# Patient Record
Sex: Female | Born: 1960 | Race: Black or African American | Hispanic: No | Smoking: Never smoker
Health system: Southern US, Community
[De-identification: ages and names within clinical notes are randomized; demographics above are authoritative.]

## PROBLEM LIST (undated history)

## (undated) DIAGNOSIS — G931 Anoxic brain damage, not elsewhere classified: Secondary | ICD-10-CM

## (undated) DIAGNOSIS — J8410 Pulmonary fibrosis, unspecified: Secondary | ICD-10-CM

## (undated) DIAGNOSIS — K5792 Diverticulitis of intestine, part unspecified, without perforation or abscess without bleeding: Secondary | ICD-10-CM

## (undated) DIAGNOSIS — I4891 Unspecified atrial fibrillation: Secondary | ICD-10-CM

## (undated) DIAGNOSIS — D473 Essential (hemorrhagic) thrombocythemia: Secondary | ICD-10-CM

## (undated) DIAGNOSIS — I2781 Cor pulmonale (chronic): Secondary | ICD-10-CM

## (undated) DIAGNOSIS — J9621 Acute and chronic respiratory failure with hypoxia: Secondary | ICD-10-CM

## (undated) DIAGNOSIS — M329 Systemic lupus erythematosus, unspecified: Secondary | ICD-10-CM

## (undated) DIAGNOSIS — Z7901 Long term (current) use of anticoagulants: Secondary | ICD-10-CM

## (undated) DIAGNOSIS — I272 Pulmonary hypertension, unspecified: Secondary | ICD-10-CM

## (undated) DIAGNOSIS — I10 Essential (primary) hypertension: Secondary | ICD-10-CM

## (undated) DIAGNOSIS — D609 Acquired pure red cell aplasia, unspecified: Secondary | ICD-10-CM

## (undated) HISTORY — DX: Systemic lupus erythematosus, unspecified: M32.9

## (undated) HISTORY — PX: CARDIAC CATHETERIZATION: SHX172

## (undated) HISTORY — DX: Acquired pure red cell aplasia, unspecified: D60.9

## (undated) HISTORY — DX: Long term (current) use of anticoagulants: Z79.01

## (undated) HISTORY — DX: Essential (hemorrhagic) thrombocythemia: D47.3

---

## 2002-06-15 ENCOUNTER — Encounter: Payer: Self-pay | Admitting: Internal Medicine

## 2002-06-15 ENCOUNTER — Encounter: Admission: RE | Admit: 2002-06-15 | Discharge: 2002-06-15 | Payer: Self-pay | Admitting: Internal Medicine

## 2002-12-10 ENCOUNTER — Encounter: Admission: RE | Admit: 2002-12-10 | Discharge: 2002-12-10 | Payer: Self-pay | Admitting: Rheumatology

## 2002-12-10 ENCOUNTER — Encounter: Payer: Self-pay | Admitting: Rheumatology

## 2002-12-17 ENCOUNTER — Encounter: Admission: RE | Admit: 2002-12-17 | Discharge: 2002-12-17 | Payer: Self-pay | Admitting: Rheumatology

## 2002-12-17 ENCOUNTER — Encounter: Payer: Self-pay | Admitting: Rheumatology

## 2006-09-01 ENCOUNTER — Emergency Department (HOSPITAL_COMMUNITY): Admission: EM | Admit: 2006-09-01 | Discharge: 2006-09-01 | Payer: Self-pay | Admitting: Emergency Medicine

## 2006-12-05 ENCOUNTER — Encounter: Admission: RE | Admit: 2006-12-05 | Discharge: 2006-12-05 | Payer: Self-pay | Admitting: Internal Medicine

## 2007-07-17 ENCOUNTER — Observation Stay (HOSPITAL_COMMUNITY): Admission: EM | Admit: 2007-07-17 | Discharge: 2007-07-18 | Payer: Self-pay | Admitting: Emergency Medicine

## 2007-07-17 ENCOUNTER — Encounter (INDEPENDENT_AMBULATORY_CARE_PROVIDER_SITE_OTHER): Payer: Self-pay | Admitting: Internal Medicine

## 2007-07-17 ENCOUNTER — Ambulatory Visit: Payer: Self-pay | Admitting: Vascular Surgery

## 2007-08-07 ENCOUNTER — Ambulatory Visit (HOSPITAL_COMMUNITY): Admission: RE | Admit: 2007-08-07 | Discharge: 2007-08-07 | Payer: Self-pay | Admitting: Neurology

## 2007-08-08 ENCOUNTER — Encounter: Admission: RE | Admit: 2007-08-08 | Discharge: 2007-08-08 | Payer: Self-pay | Admitting: Neurology

## 2007-09-02 ENCOUNTER — Ambulatory Visit (HOSPITAL_COMMUNITY): Admission: RE | Admit: 2007-09-02 | Discharge: 2007-09-02 | Payer: Self-pay | Admitting: Neurology

## 2007-09-30 ENCOUNTER — Encounter: Admission: RE | Admit: 2007-09-30 | Discharge: 2007-12-29 | Payer: Self-pay | Admitting: Otolaryngology

## 2008-10-08 HISTORY — PX: BONE MARROW BIOPSY: SHX199

## 2009-07-27 ENCOUNTER — Observation Stay (HOSPITAL_COMMUNITY): Admission: EM | Admit: 2009-07-27 | Discharge: 2009-07-29 | Payer: Self-pay | Admitting: Emergency Medicine

## 2009-09-02 ENCOUNTER — Inpatient Hospital Stay (HOSPITAL_COMMUNITY): Admission: EM | Admit: 2009-09-02 | Discharge: 2009-09-07 | Payer: Self-pay | Admitting: Emergency Medicine

## 2009-09-03 ENCOUNTER — Ambulatory Visit: Payer: Self-pay | Admitting: Hematology & Oncology

## 2009-09-05 ENCOUNTER — Encounter (INDEPENDENT_AMBULATORY_CARE_PROVIDER_SITE_OTHER): Payer: Self-pay | Admitting: Internal Medicine

## 2009-09-07 ENCOUNTER — Ambulatory Visit: Payer: Self-pay | Admitting: Hematology & Oncology

## 2009-09-13 LAB — MORPHOLOGY - CHCC SATELLITE: PLT EST ~~LOC~~: INCREASED

## 2009-09-13 LAB — CBC WITH DIFFERENTIAL (CANCER CENTER ONLY)
BASO#: 0.2 10*3/uL (ref 0.0–0.2)
BASO%: 1 % (ref 0.0–2.0)
EOS%: 1.1 % (ref 0.0–7.0)
Eosinophils Absolute: 0.2 10*3/uL (ref 0.0–0.5)
HCT: 28.3 % — ABNORMAL LOW (ref 34.8–46.6)
HGB: 10.1 g/dL — ABNORMAL LOW (ref 11.6–15.9)
LYMPH#: 4.4 10*3/uL — ABNORMAL HIGH (ref 0.9–3.3)
LYMPH%: 22 % (ref 14.0–48.0)
MCH: 30.3 pg (ref 26.0–34.0)
MCHC: 35.8 g/dL (ref 32.0–36.0)
MCV: 85 fL (ref 81–101)
MONO#: 1.4 10*3/uL — ABNORMAL HIGH (ref 0.1–0.9)
MONO%: 7.2 % (ref 0.0–13.0)
NEUT#: 13.7 10*3/uL — ABNORMAL HIGH (ref 1.5–6.5)
NEUT%: 68.7 % (ref 39.6–80.0)
Platelets: 987 10*3/uL — ABNORMAL HIGH (ref 145–400)
RBC: 3.35 10*6/uL — ABNORMAL LOW (ref 3.70–5.32)
RDW: 12.2 % (ref 10.5–14.6)
WBC: 20 10*3/uL — ABNORMAL HIGH (ref 3.9–10.0)

## 2009-09-13 LAB — LACTATE DEHYDROGENASE: LDH: 111 U/L (ref 94–250)

## 2009-09-13 LAB — RETICULOCYTES (CHCC)
ABS Retic: 6.4 10*3/uL — ABNORMAL LOW (ref 19.0–186.0)
RBC.: 3.22 MIL/uL — ABNORMAL LOW (ref 3.87–5.11)
Retic Ct Pct: 0.2 % — ABNORMAL LOW (ref 0.4–3.1)

## 2009-09-27 LAB — CBC WITH DIFFERENTIAL (CANCER CENTER ONLY)
BASO#: 0.1 10*3/uL (ref 0.0–0.2)
BASO%: 0.8 % (ref 0.0–2.0)
EOS%: 1.2 % (ref 0.0–7.0)
Eosinophils Absolute: 0.2 10*3/uL (ref 0.0–0.5)
HCT: 30.8 % — ABNORMAL LOW (ref 34.8–46.6)
HGB: 10.8 g/dL — ABNORMAL LOW (ref 11.6–15.9)
LYMPH#: 4.3 10*3/uL — ABNORMAL HIGH (ref 0.9–3.3)
LYMPH%: 24.4 % (ref 14.0–48.0)
MCH: 31.6 pg (ref 26.0–34.0)
MCHC: 35.1 g/dL (ref 32.0–36.0)
MCV: 90 fL (ref 81–101)
MONO#: 1 10*3/uL — ABNORMAL HIGH (ref 0.1–0.9)
MONO%: 5.5 % (ref 0.0–13.0)
NEUT#: 11.9 10*3/uL — ABNORMAL HIGH (ref 1.5–6.5)
NEUT%: 68.1 % (ref 39.6–80.0)
Platelets: 519 10*3/uL — ABNORMAL HIGH (ref 145–400)
RBC: 3.43 10*6/uL — ABNORMAL LOW (ref 3.70–5.32)
RDW: 13 % (ref 10.5–14.6)
WBC: 17.5 10*3/uL — ABNORMAL HIGH (ref 3.9–10.0)

## 2009-09-27 LAB — CHCC SATELLITE - SMEAR

## 2009-09-30 LAB — RETICULOCYTES (CHCC)
ABS Retic: 372.9 10*3/uL — ABNORMAL HIGH (ref 19.0–186.0)
RBC.: 3.39 MIL/uL — ABNORMAL LOW (ref 3.87–5.11)
Retic Ct Pct: 11 % — ABNORMAL HIGH (ref 0.4–3.1)

## 2009-09-30 LAB — JAK2 GENOTYPR: JAK2 GenotypR: NOT DETECTED

## 2009-10-10 ENCOUNTER — Ambulatory Visit: Payer: Self-pay | Admitting: Hematology & Oncology

## 2009-10-11 LAB — CBC WITH DIFFERENTIAL (CANCER CENTER ONLY)
BASO#: 0.2 10*3/uL (ref 0.0–0.2)
BASO%: 1 % (ref 0.0–2.0)
EOS%: 1.8 % (ref 0.0–7.0)
Eosinophils Absolute: 0.3 10*3/uL (ref 0.0–0.5)
HCT: 41.1 % (ref 34.8–46.6)
HGB: 13.5 g/dL (ref 11.6–15.9)
LYMPH#: 3.8 10*3/uL — ABNORMAL HIGH (ref 0.9–3.3)
LYMPH%: 21.9 % (ref 14.0–48.0)
MCH: 33 pg (ref 26.0–34.0)
MCHC: 32.9 g/dL (ref 32.0–36.0)
MCV: 100 fL (ref 81–101)
MONO#: 0.9 10*3/uL (ref 0.1–0.9)
MONO%: 5.1 % (ref 0.0–13.0)
NEUT#: 12.1 10*3/uL — ABNORMAL HIGH (ref 1.5–6.5)
NEUT%: 70.2 % (ref 39.6–80.0)
Platelets: 349 10*3/uL (ref 145–400)
RBC: 4.1 10*6/uL (ref 3.70–5.32)
RDW: 18.5 % — ABNORMAL HIGH (ref 10.5–14.6)
WBC: 17.3 10*3/uL — ABNORMAL HIGH (ref 3.9–10.0)

## 2009-10-11 LAB — RETICULOCYTES (CHCC)
ABS Retic: 96.7 10*3/uL (ref 19.0–186.0)
RBC.: 4.03 MIL/uL (ref 3.87–5.11)
Retic Ct Pct: 2.4 % (ref 0.4–3.1)

## 2009-10-11 LAB — CHCC SATELLITE - SMEAR

## 2009-10-11 LAB — TECHNOLOGIST REVIEW CHCC SATELLITE

## 2009-11-01 LAB — CBC WITH DIFFERENTIAL (CANCER CENTER ONLY)
BASO#: 0.2 10*3/uL (ref 0.0–0.2)
BASO%: 1.2 % (ref 0.0–2.0)
EOS%: 1.9 % (ref 0.0–7.0)
Eosinophils Absolute: 0.4 10*3/uL (ref 0.0–0.5)
HCT: 40.4 % (ref 34.8–46.6)
HGB: 13.5 g/dL (ref 11.6–15.9)
LYMPH#: 3.6 10*3/uL — ABNORMAL HIGH (ref 0.9–3.3)
LYMPH%: 18.6 % (ref 14.0–48.0)
MCH: 33.4 pg (ref 26.0–34.0)
MCHC: 33.5 g/dL (ref 32.0–36.0)
MCV: 100 fL (ref 81–101)
MONO#: 1.2 10*3/uL — ABNORMAL HIGH (ref 0.1–0.9)
MONO%: 6.4 % (ref 0.0–13.0)
NEUT#: 13.9 10*3/uL — ABNORMAL HIGH (ref 1.5–6.5)
NEUT%: 71.9 % (ref 39.6–80.0)
Platelets: 417 10*3/uL — ABNORMAL HIGH (ref 145–400)
RBC: 4.04 10*6/uL (ref 3.70–5.32)
RDW: 13.5 % (ref 10.5–14.6)
WBC: 19.3 10*3/uL — ABNORMAL HIGH (ref 3.9–10.0)

## 2009-11-01 LAB — RETICULOCYTES (CHCC)
ABS Retic: 77.1 10*3/uL (ref 19.0–186.0)
RBC.: 4.06 MIL/uL (ref 3.87–5.11)
Retic Ct Pct: 1.9 % (ref 0.4–3.1)

## 2009-11-01 LAB — CHCC SATELLITE - SMEAR

## 2009-11-10 ENCOUNTER — Encounter: Admission: RE | Admit: 2009-11-10 | Discharge: 2009-11-10 | Payer: Self-pay | Admitting: Internal Medicine

## 2009-11-21 ENCOUNTER — Ambulatory Visit: Payer: Self-pay | Admitting: Hematology & Oncology

## 2009-11-22 LAB — RETICULOCYTES (CHCC)
ABS Retic: 63.2 10*3/uL (ref 19.0–186.0)
RBC.: 4.21 MIL/uL (ref 3.87–5.11)
Retic Ct Pct: 1.5 % (ref 0.4–3.1)

## 2009-11-22 LAB — CBC WITH DIFFERENTIAL (CANCER CENTER ONLY)
BASO#: 0.1 10*3/uL (ref 0.0–0.2)
BASO%: 0.7 % (ref 0.0–2.0)
EOS%: 1.6 % (ref 0.0–7.0)
Eosinophils Absolute: 0.2 10*3/uL (ref 0.0–0.5)
HCT: 43.1 % (ref 34.8–46.6)
HGB: 14.4 g/dL (ref 11.6–15.9)
LYMPH#: 1.9 10*3/uL (ref 0.9–3.3)
LYMPH%: 15 % (ref 14.0–48.0)
MCH: 33.1 pg (ref 26.0–34.0)
MCHC: 33.4 g/dL (ref 32.0–36.0)
MCV: 99 fL (ref 81–101)
MONO#: 0.7 10*3/uL (ref 0.1–0.9)
MONO%: 5.7 % (ref 0.0–13.0)
NEUT#: 9.8 10*3/uL — ABNORMAL HIGH (ref 1.5–6.5)
NEUT%: 77 % (ref 39.6–80.0)
Platelets: 439 10*3/uL — ABNORMAL HIGH (ref 145–400)
RBC: 4.36 10*6/uL (ref 3.70–5.32)
RDW: 11.5 % (ref 10.5–14.6)
WBC: 12.7 10*3/uL — ABNORMAL HIGH (ref 3.9–10.0)

## 2009-11-22 LAB — CHCC SATELLITE - SMEAR

## 2010-01-02 ENCOUNTER — Ambulatory Visit: Payer: Self-pay | Admitting: Hematology & Oncology

## 2010-01-03 LAB — CBC WITH DIFFERENTIAL (CANCER CENTER ONLY)
BASO#: 0.1 10*3/uL (ref 0.0–0.2)
BASO%: 1.2 % (ref 0.0–2.0)
EOS%: 7.3 % — ABNORMAL HIGH (ref 0.0–7.0)
Eosinophils Absolute: 0.7 10*3/uL — ABNORMAL HIGH (ref 0.0–0.5)
HCT: 43 % (ref 34.8–46.6)
HGB: 14.1 g/dL (ref 11.6–15.9)
LYMPH#: 2.4 10*3/uL (ref 0.9–3.3)
LYMPH%: 24.2 % (ref 14.0–48.0)
MCH: 31.4 pg (ref 26.0–34.0)
MCHC: 32.9 g/dL (ref 32.0–36.0)
MCV: 95 fL (ref 81–101)
MONO#: 0.8 10*3/uL (ref 0.1–0.9)
MONO%: 7.9 % (ref 0.0–13.0)
NEUT#: 5.8 10*3/uL (ref 1.5–6.5)
NEUT%: 59.4 % (ref 39.6–80.0)
Platelets: 484 10*3/uL — ABNORMAL HIGH (ref 145–400)
RBC: 4.51 10*6/uL (ref 3.70–5.32)
RDW: 10.6 % (ref 10.5–14.6)
WBC: 9.7 10*3/uL (ref 3.9–10.0)

## 2010-01-03 LAB — RETICULOCYTES (CHCC)
ABS Retic: 40.2 10*3/uL (ref 19.0–186.0)
RBC.: 4.47 MIL/uL (ref 3.87–5.11)
Retic Ct Pct: 0.9 % (ref 0.4–3.1)

## 2010-01-03 LAB — CHCC SATELLITE - SMEAR

## 2010-02-20 ENCOUNTER — Ambulatory Visit: Payer: Self-pay | Admitting: Hematology & Oncology

## 2010-02-21 LAB — CBC WITH DIFFERENTIAL (CANCER CENTER ONLY)
BASO#: 0.1 10*3/uL (ref 0.0–0.2)
BASO%: 0.8 % (ref 0.0–2.0)
EOS%: 2.2 % (ref 0.0–7.0)
Eosinophils Absolute: 0.2 10*3/uL (ref 0.0–0.5)
HCT: 39.5 % (ref 34.8–46.6)
HGB: 13.5 g/dL (ref 11.6–15.9)
LYMPH#: 1.8 10*3/uL (ref 0.9–3.3)
LYMPH%: 18.3 % (ref 14.0–48.0)
MCH: 32.4 pg (ref 26.0–34.0)
MCHC: 34.3 g/dL (ref 32.0–36.0)
MCV: 95 fL (ref 81–101)
MONO#: 0.7 10*3/uL (ref 0.1–0.9)
MONO%: 6.8 % (ref 0.0–13.0)
NEUT#: 7 10*3/uL — ABNORMAL HIGH (ref 1.5–6.5)
NEUT%: 71.9 % (ref 39.6–80.0)
Platelets: 433 10*3/uL — ABNORMAL HIGH (ref 145–400)
RBC: 4.18 10*6/uL (ref 3.70–5.32)
RDW: 11.9 % (ref 10.5–14.6)
WBC: 9.7 10*3/uL (ref 3.9–10.0)

## 2010-02-21 LAB — RETICULOCYTES (CHCC)
ABS Retic: 48.6 10*3/uL (ref 19.0–186.0)
RBC.: 4.05 MIL/uL (ref 3.87–5.11)
Retic Ct Pct: 1.2 % (ref 0.4–3.1)

## 2010-02-21 LAB — FERRITIN: Ferritin: 136 ng/mL (ref 10–291)

## 2010-02-21 LAB — CHCC SATELLITE - SMEAR

## 2010-06-09 ENCOUNTER — Ambulatory Visit: Payer: Self-pay | Admitting: Hematology & Oncology

## 2010-06-13 LAB — CBC WITH DIFFERENTIAL (CANCER CENTER ONLY)
BASO#: 0.1 10*3/uL (ref 0.0–0.2)
BASO%: 0.7 % (ref 0.0–2.0)
EOS%: 1.5 % (ref 0.0–7.0)
Eosinophils Absolute: 0.2 10*3/uL (ref 0.0–0.5)
HCT: 39.6 % (ref 34.8–46.6)
HGB: 13.2 g/dL (ref 11.6–15.9)
LYMPH#: 2 10*3/uL (ref 0.9–3.3)
LYMPH%: 16.6 % (ref 14.0–48.0)
MCH: 30.4 pg (ref 26.0–34.0)
MCHC: 33.3 g/dL (ref 32.0–36.0)
MCV: 91 fL (ref 81–101)
MONO#: 0.7 10*3/uL (ref 0.1–0.9)
MONO%: 5.8 % (ref 0.0–13.0)
NEUT#: 8.9 10*3/uL — ABNORMAL HIGH (ref 1.5–6.5)
NEUT%: 75.4 % (ref 39.6–80.0)
Platelets: 468 10*3/uL — ABNORMAL HIGH (ref 145–400)
RBC: 4.35 10*6/uL (ref 3.70–5.32)
RDW: 11.4 % (ref 10.5–14.6)
WBC: 11.8 10*3/uL — ABNORMAL HIGH (ref 3.9–10.0)

## 2010-06-13 LAB — RETICULOCYTES (CHCC)
ABS Retic: 56.9 10*3/uL (ref 19.0–186.0)
RBC.: 4.38 MIL/uL (ref 3.87–5.11)
Retic Ct Pct: 1.3 % (ref 0.4–3.1)

## 2010-06-13 LAB — CHCC SATELLITE - SMEAR

## 2010-06-13 LAB — FERRITIN: Ferritin: 109 ng/mL (ref 10–291)

## 2010-08-04 ENCOUNTER — Ambulatory Visit: Payer: Self-pay | Admitting: Hematology & Oncology

## 2010-08-08 LAB — CBC WITH DIFFERENTIAL (CANCER CENTER ONLY)
BASO#: 0.1 10*3/uL (ref 0.0–0.2)
BASO%: 0.7 % (ref 0.0–2.0)
EOS%: 6.1 % (ref 0.0–7.0)
Eosinophils Absolute: 0.5 10*3/uL (ref 0.0–0.5)
HCT: 38.5 % (ref 34.8–46.6)
HGB: 12.5 g/dL (ref 11.6–15.9)
LYMPH#: 1.7 10*3/uL (ref 0.9–3.3)
LYMPH%: 18.8 % (ref 14.0–48.0)
MCH: 29.8 pg (ref 26.0–34.0)
MCHC: 32.3 g/dL (ref 32.0–36.0)
MCV: 92 fL (ref 81–101)
MONO#: 0.6 10*3/uL (ref 0.1–0.9)
MONO%: 7.1 % (ref 0.0–13.0)
NEUT#: 6 10*3/uL (ref 1.5–6.5)
NEUT%: 67.3 % (ref 39.6–80.0)
Platelets: 593 10*3/uL — ABNORMAL HIGH (ref 145–400)
RBC: 4.18 10*6/uL (ref 3.70–5.32)
RDW: 13.2 % (ref 10.5–14.6)
WBC: 8.9 10*3/uL (ref 3.9–10.0)

## 2010-08-08 LAB — CHCC SATELLITE - SMEAR

## 2010-08-11 LAB — COMPREHENSIVE METABOLIC PANEL
ALT: 9 U/L (ref 0–35)
AST: 13 U/L (ref 0–37)
Albumin: 3.7 g/dL (ref 3.5–5.2)
Alkaline Phosphatase: 71 U/L (ref 39–117)
BUN: 10 mg/dL (ref 6–23)
CO2: 26 mEq/L (ref 19–32)
Calcium: 9.2 mg/dL (ref 8.4–10.5)
Chloride: 100 mEq/L (ref 96–112)
Creatinine, Ser: 0.79 mg/dL (ref 0.40–1.20)
Glucose, Bld: 94 mg/dL (ref 70–99)
Potassium: 3.6 mEq/L (ref 3.5–5.3)
Sodium: 137 mEq/L (ref 135–145)
Total Bilirubin: 0.3 mg/dL (ref 0.3–1.2)
Total Protein: 7.8 g/dL (ref 6.0–8.3)

## 2010-08-11 LAB — JAK2 GENOTYPR: JAK2 GenotypR: NOT DETECTED

## 2010-08-11 LAB — VITAMIN D 25 HYDROXY (VIT D DEFICIENCY, FRACTURES): Vit D, 25-Hydroxy: 23 ng/mL — ABNORMAL LOW (ref 30–89)

## 2010-08-11 LAB — RETICULOCYTES (CHCC)
ABS Retic: 44.8 10*3/uL (ref 19.0–186.0)
RBC.: 4.07 MIL/uL (ref 3.87–5.11)
Retic Ct Pct: 1.1 % (ref 0.4–3.1)

## 2010-08-11 LAB — FERRITIN: Ferritin: 357 ng/mL — ABNORMAL HIGH (ref 10–291)

## 2010-09-26 ENCOUNTER — Ambulatory Visit: Payer: Self-pay | Admitting: Hematology & Oncology

## 2010-09-28 LAB — CBC WITH DIFFERENTIAL (CANCER CENTER ONLY)
BASO#: 0 10*3/uL (ref 0.0–0.2)
BASO%: 0.4 % (ref 0.0–2.0)
EOS%: 5 % (ref 0.0–7.0)
Eosinophils Absolute: 0.4 10*3/uL (ref 0.0–0.5)
HCT: 35.5 % (ref 34.8–46.6)
HGB: 11.8 g/dL (ref 11.6–15.9)
LYMPH#: 1.4 10*3/uL (ref 0.9–3.3)
LYMPH%: 18.6 % (ref 14.0–48.0)
MCH: 30.7 pg (ref 26.0–34.0)
MCHC: 33.3 g/dL (ref 32.0–36.0)
MCV: 92 fL (ref 81–101)
MONO#: 0.6 10*3/uL (ref 0.1–0.9)
MONO%: 7.6 % (ref 0.0–13.0)
NEUT#: 5.2 10*3/uL (ref 1.5–6.5)
NEUT%: 68.4 % (ref 39.6–80.0)
Platelets: 490 10*3/uL — ABNORMAL HIGH (ref 145–400)
RBC: 3.84 10*6/uL (ref 3.70–5.32)
RDW: 11.1 % (ref 10.5–14.6)
WBC: 7.6 10*3/uL (ref 3.9–10.0)

## 2010-09-28 LAB — CHCC SATELLITE - SMEAR

## 2010-10-10 LAB — VON WILLEBRAND PANEL
Factor-VIII Activity: 78 % (ref 50–150)
Ristocetin-Cofactor: 65 % (ref 50–150)
Von Willebrand Ag: 135 % normal (ref 61–164)

## 2010-10-10 LAB — IRON AND TIBC
%SAT: 17 % — ABNORMAL LOW (ref 20–55)
Iron: 38 ug/dL — ABNORMAL LOW (ref 42–145)
TIBC: 222 ug/dL — ABNORMAL LOW (ref 250–470)
UIBC: 184 ug/dL

## 2010-10-10 LAB — RETICULOCYTES (CHCC)
ABS Retic: 38.2 10*3/uL (ref 19.0–186.0)
RBC.: 3.82 MIL/uL — ABNORMAL LOW (ref 3.87–5.11)
Retic Ct Pct: 1 % (ref 0.4–3.1)

## 2010-10-10 LAB — FERRITIN: Ferritin: 261 ng/mL (ref 10–291)

## 2010-10-30 ENCOUNTER — Ambulatory Visit (HOSPITAL_BASED_OUTPATIENT_CLINIC_OR_DEPARTMENT_OTHER): Payer: BC Managed Care – PPO | Admitting: Hematology & Oncology

## 2011-01-09 LAB — CBC
HCT: 27 % — ABNORMAL LOW (ref 36.0–46.0)
Hemoglobin: 9.4 g/dL — ABNORMAL LOW (ref 12.0–15.0)
MCHC: 34.8 g/dL (ref 30.0–36.0)
MCV: 89 fL (ref 78.0–100.0)
Platelets: 759 10*3/uL — ABNORMAL HIGH (ref 150–400)
RBC: 3.03 MIL/uL — ABNORMAL LOW (ref 3.87–5.11)
RDW: 14.3 % (ref 11.5–15.5)
WBC: 8 10*3/uL (ref 4.0–10.5)

## 2011-01-10 LAB — GC/CHLAMYDIA PROBE AMP, GENITAL
Chlamydia, DNA Probe: NEGATIVE
GC Probe Amp, Genital: NEGATIVE

## 2011-01-10 LAB — CBC
HCT: 18.8 % — ABNORMAL LOW (ref 36.0–46.0)
HCT: 22.8 % — ABNORMAL LOW (ref 36.0–46.0)
HCT: 23.3 % — ABNORMAL LOW (ref 36.0–46.0)
HCT: 23.4 % — ABNORMAL LOW (ref 36.0–46.0)
HCT: 24.4 % — ABNORMAL LOW (ref 36.0–46.0)
Hemoglobin: 6.5 g/dL — CL (ref 12.0–15.0)
Hemoglobin: 8 g/dL — ABNORMAL LOW (ref 12.0–15.0)
Hemoglobin: 8.1 g/dL — ABNORMAL LOW (ref 12.0–15.0)
Hemoglobin: 8.1 g/dL — ABNORMAL LOW (ref 12.0–15.0)
Hemoglobin: 8.5 g/dL — ABNORMAL LOW (ref 12.0–15.0)
MCHC: 34.6 g/dL (ref 30.0–36.0)
MCHC: 34.7 g/dL (ref 30.0–36.0)
MCHC: 34.7 g/dL (ref 30.0–36.0)
MCHC: 35 g/dL (ref 30.0–36.0)
MCHC: 35 g/dL (ref 30.0–36.0)
MCV: 88.3 fL (ref 78.0–100.0)
MCV: 89.2 fL (ref 78.0–100.0)
MCV: 89.5 fL (ref 78.0–100.0)
MCV: 89.7 fL (ref 78.0–100.0)
MCV: 90.1 fL (ref 78.0–100.0)
Platelets: 667 10*3/uL — ABNORMAL HIGH (ref 150–400)
Platelets: 674 10*3/uL — ABNORMAL HIGH (ref 150–400)
Platelets: 697 10*3/uL — ABNORMAL HIGH (ref 150–400)
Platelets: 710 10*3/uL — ABNORMAL HIGH (ref 150–400)
Platelets: 742 10*3/uL — ABNORMAL HIGH (ref 150–400)
RBC: 2.08 MIL/uL — ABNORMAL LOW (ref 3.87–5.11)
RBC: 2.59 MIL/uL — ABNORMAL LOW (ref 3.87–5.11)
RBC: 2.6 MIL/uL — ABNORMAL LOW (ref 3.87–5.11)
RBC: 2.61 MIL/uL — ABNORMAL LOW (ref 3.87–5.11)
RBC: 2.74 MIL/uL — ABNORMAL LOW (ref 3.87–5.11)
RDW: 14.2 % (ref 11.5–15.5)
RDW: 14.2 % (ref 11.5–15.5)
RDW: 14.5 % (ref 11.5–15.5)
RDW: 14.6 % (ref 11.5–15.5)
RDW: 14.9 % (ref 11.5–15.5)
WBC: 11.7 10*3/uL — ABNORMAL HIGH (ref 4.0–10.5)
WBC: 7.1 10*3/uL (ref 4.0–10.5)
WBC: 7.3 10*3/uL (ref 4.0–10.5)
WBC: 7.6 10*3/uL (ref 4.0–10.5)
WBC: 8.7 10*3/uL (ref 4.0–10.5)

## 2011-01-10 LAB — DIFFERENTIAL
Basophils Absolute: 0 10*3/uL (ref 0.0–0.1)
Basophils Absolute: 0.1 10*3/uL (ref 0.0–0.1)
Basophils Relative: 0 % (ref 0–1)
Basophils Relative: 1 % (ref 0–1)
Eosinophils Absolute: 0.1 10*3/uL (ref 0.0–0.7)
Eosinophils Absolute: 0.1 10*3/uL (ref 0.0–0.7)
Eosinophils Relative: 1 % (ref 0–5)
Eosinophils Relative: 2 % (ref 0–5)
Lymphocytes Relative: 20 % (ref 12–46)
Lymphocytes Relative: 9 % — ABNORMAL LOW (ref 12–46)
Lymphs Abs: 1.1 10*3/uL (ref 0.7–4.0)
Lymphs Abs: 1.7 10*3/uL (ref 0.7–4.0)
Monocytes Absolute: 0.4 10*3/uL (ref 0.1–1.0)
Monocytes Absolute: 0.7 10*3/uL (ref 0.1–1.0)
Monocytes Relative: 4 % (ref 3–12)
Monocytes Relative: 8 % (ref 3–12)
Neutro Abs: 10.1 10*3/uL — ABNORMAL HIGH (ref 1.7–7.7)
Neutro Abs: 6.1 10*3/uL (ref 1.7–7.7)
Neutrophils Relative %: 70 % (ref 43–77)
Neutrophils Relative %: 86 % — ABNORMAL HIGH (ref 43–77)

## 2011-01-10 LAB — CROSSMATCH
ABO/RH(D): B POS
ABO/RH(D): B POS
Antibody Screen: NEGATIVE
Antibody Screen: NEGATIVE

## 2011-01-10 LAB — UIFE/LIGHT CHAINS/TP QN, 24-HR UR
Albumin, U: DETECTED
Albumin, U: DETECTED
Alpha 1, Urine: DETECTED — AB
Alpha 1, Urine: DETECTED — AB
Alpha 2, Urine: DETECTED — AB
Alpha 2, Urine: DETECTED — AB
Beta, Urine: DETECTED — AB
Beta, Urine: DETECTED — AB
Free Kappa Lt Chains,Ur: 12.3 mg/dL — ABNORMAL HIGH (ref 0.04–1.51)
Free Kappa Lt Chains,Ur: 6.6 mg/dL — ABNORMAL HIGH (ref 0.04–1.51)
Free Kappa/Lambda Ratio: 3.77 ratio (ref 0.46–4.00)
Free Lambda Excretion/Day: 22.75 mg/d
Free Lambda Lt Chains,Ur: 1.22 mg/dL — ABNORMAL HIGH (ref 0.08–1.01)
Free Lambda Lt Chains,Ur: 1.75 mg/dL — ABNORMAL HIGH (ref 0.08–1.01)
Free Lt Chn Excr Rate: 85.8 mg/d
Gamma Globulin, Urine: DETECTED — AB
Gamma Globulin, Urine: DETECTED — AB
Time: 24 hours
Total Protein, Urine-Ur/day: 121 mg/d (ref 10–140)
Total Protein, Urine: 15.7 mg/dL
Total Protein, Urine: 9.3 mg/dL
Volume, Urine: 1300 mL

## 2011-01-10 LAB — COMPREHENSIVE METABOLIC PANEL
ALT: 26 U/L (ref 0–35)
AST: 26 U/L (ref 0–37)
Albumin: 3.3 g/dL — ABNORMAL LOW (ref 3.5–5.2)
Alkaline Phosphatase: 94 U/L (ref 39–117)
BUN: 12 mg/dL (ref 6–23)
CO2: 23 mEq/L (ref 19–32)
Calcium: 8.9 mg/dL (ref 8.4–10.5)
Chloride: 107 mEq/L (ref 96–112)
Creatinine, Ser: 0.8 mg/dL (ref 0.4–1.2)
GFR calc Af Amer: >60 mL/min (ref >60)
GFR calc non Af Amer: >60 mL/min (ref >60)
Glucose, Bld: 124 mg/dL — ABNORMAL HIGH (ref 70–99)
Potassium: 4.1 mEq/L (ref 3.5–5.1)
Sodium: 134 mEq/L — ABNORMAL LOW (ref 135–145)
Total Bilirubin: 0.4 mg/dL (ref 0.3–1.2)
Total Protein: 8.5 g/dL — ABNORMAL HIGH (ref 6.0–8.3)

## 2011-01-10 LAB — PROTEIN ELECTROPHORESIS, SERUM
Albumin ELP: 44.2 % — ABNORMAL LOW (ref 55.8–66.1)
Alpha-1-Globulin: 4.2 % (ref 2.9–4.9)
Alpha-2-Globulin: 8.3 % (ref 7.1–11.8)
Beta 2: 6.4 % (ref 3.2–6.5)
Beta Globulin: 4.9 % (ref 4.7–7.2)
Gamma Globulin: 32 % — ABNORMAL HIGH (ref 11.1–18.8)
M-Spike, %: NOT DETECTED g/dL
Total Protein ELP: 8 g/dL (ref 6.0–8.3)

## 2011-01-10 LAB — RETICULOCYTES
RBC.: 2.46 MIL/uL — ABNORMAL LOW (ref 3.87–5.11)
RBC.: 2.56 MIL/uL — ABNORMAL LOW (ref 3.87–5.11)
Retic Count, Absolute: 5.1 10*3/uL — ABNORMAL LOW (ref 19.0–186.0)
Retic Count, Absolute: 9.8 10*3/uL — ABNORMAL LOW (ref 19.0–186.0)
Retic Ct Pct: 0.2 % — ABNORMAL LOW (ref 0.4–3.1)
Retic Ct Pct: 0.4 % (ref 0.4–3.1)

## 2011-01-10 LAB — BASIC METABOLIC PANEL
BUN: 12 mg/dL (ref 6–23)
BUN: 14 mg/dL (ref 6–23)
CO2: 26 mEq/L (ref 19–32)
CO2: 28 mEq/L (ref 19–32)
Calcium: 8.7 mg/dL (ref 8.4–10.5)
Calcium: 8.7 mg/dL (ref 8.4–10.5)
Chloride: 105 mEq/L (ref 96–112)
Chloride: 107 mEq/L (ref 96–112)
Creatinine, Ser: 0.76 mg/dL (ref 0.4–1.2)
Creatinine, Ser: 0.82 mg/dL (ref 0.4–1.2)
GFR calc Af Amer: >60 mL/min (ref >60)
GFR calc Af Amer: >60 mL/min (ref >60)
GFR calc non Af Amer: >60 mL/min (ref >60)
GFR calc non Af Amer: >60 mL/min (ref >60)
Glucose, Bld: 85 mg/dL (ref 70–99)
Glucose, Bld: 97 mg/dL (ref 70–99)
Potassium: 3.8 mEq/L (ref 3.5–5.1)
Potassium: 4 mEq/L (ref 3.5–5.1)
Sodium: 137 mEq/L (ref 135–145)
Sodium: 137 mEq/L (ref 135–145)

## 2011-01-10 LAB — PROTEIN ELECTROPH W RFLX QUANT IMMUNOGLOBULINS
Albumin ELP: 41.9 % — ABNORMAL LOW (ref 55.8–66.1)
Alpha-1-Globulin: 4.3 % (ref 2.9–4.9)
Alpha-2-Globulin: 8.6 % (ref 7.1–11.8)
Beta 2: 7.4 % — ABNORMAL HIGH (ref 3.2–6.5)
Beta Globulin: 4.9 % (ref 4.7–7.2)
Gamma Globulin: 32.9 % — ABNORMAL HIGH (ref 11.1–18.8)
M-Spike, %: NOT DETECTED g/dL
Total Protein ELP: 8 g/dL (ref 6.0–8.3)

## 2011-01-10 LAB — HEMOCCULT GUIAC POC 1CARD (OFFICE): Fecal Occult Bld: NEGATIVE

## 2011-01-10 LAB — WET PREP, GENITAL
Clue Cells Wet Prep HPF POC: NONE SEEN
Trich, Wet Prep: NONE SEEN
Yeast Wet Prep HPF POC: NONE SEEN

## 2011-01-10 LAB — URINE MICROSCOPIC-ADD ON

## 2011-01-10 LAB — SAVE SMEAR

## 2011-01-10 LAB — ANTI-NUCLEAR AB-TITER (ANA TITER): ANA Titer 1: NEGATIVE

## 2011-01-10 LAB — LACTATE DEHYDROGENASE
LDH: 116 U/L (ref 94–250)
LDH: 119 U/L (ref 94–250)

## 2011-01-10 LAB — URINALYSIS, ROUTINE W REFLEX MICROSCOPIC
Bilirubin Urine: NEGATIVE
Glucose, UA: NEGATIVE mg/dL
Hgb urine dipstick: NEGATIVE
Ketones, ur: NEGATIVE mg/dL
Nitrite: NEGATIVE
Protein, ur: NEGATIVE mg/dL
Specific Gravity, Urine: 1.025 (ref 1.005–1.030)
Urobilinogen, UA: 1 mg/dL (ref 0.0–1.0)
pH: 7.5 (ref 5.0–8.0)

## 2011-01-10 LAB — CHROMOSOME ANALYSIS, BONE MARROW

## 2011-01-10 LAB — DIRECT ANTIGLOBULIN TEST (NOT AT ARMC)
DAT, IgG: POSITIVE
DAT, complement: POSITIVE

## 2011-01-10 LAB — PREGNANCY, URINE: Preg Test, Ur: NEGATIVE

## 2011-01-10 LAB — ANA: Anti Nuclear Antibody(ANA): POSITIVE — AB

## 2011-01-10 LAB — ERYTHROPOIETIN: Erythropoietin: 554 m[IU]/mL — ABNORMAL HIGH (ref 2.6–34.0)

## 2011-01-10 LAB — BONE MARROW EXAM

## 2011-01-10 LAB — COLD AGGLUTININ TITER

## 2011-01-10 LAB — TSH: TSH: 2.003 u[IU]/mL (ref 0.350–4.500)

## 2011-01-10 LAB — HIV ANTIBODY (ROUTINE TESTING W REFLEX): HIV: NONREACTIVE

## 2011-01-10 LAB — PARVOVIRUS B19 ANTIBODY, IGG AND IGM
Parovirus B19 IgG Abs: 4.9 Index — ABNORMAL HIGH (ref <0.9)
Parovirus B19 IgM Abs: <0.9 Index (ref <0.9)

## 2011-01-10 LAB — RPR: RPR Ser Ql: NONREACTIVE

## 2011-01-11 LAB — BASIC METABOLIC PANEL
BUN: 8 mg/dL (ref 6–23)
BUN: 8 mg/dL (ref 6–23)
CO2: 24 mEq/L (ref 19–32)
CO2: 24 mEq/L (ref 19–32)
Calcium: 8.4 mg/dL (ref 8.4–10.5)
Calcium: 8.7 mg/dL (ref 8.4–10.5)
Chloride: 105 mEq/L (ref 96–112)
Chloride: 105 mEq/L (ref 96–112)
Creatinine, Ser: 0.77 mg/dL (ref 0.4–1.2)
Creatinine, Ser: 0.79 mg/dL (ref 0.4–1.2)
GFR calc Af Amer: >60 mL/min (ref >60)
GFR calc Af Amer: >60 mL/min (ref >60)
GFR calc non Af Amer: >60 mL/min (ref >60)
GFR calc non Af Amer: >60 mL/min (ref >60)
Glucose, Bld: 120 mg/dL — ABNORMAL HIGH (ref 70–99)
Glucose, Bld: 93 mg/dL (ref 70–99)
Potassium: 3.5 mEq/L (ref 3.5–5.1)
Potassium: 3.9 mEq/L (ref 3.5–5.1)
Sodium: 135 mEq/L (ref 135–145)
Sodium: 138 mEq/L (ref 135–145)

## 2011-01-11 LAB — TYPE AND SCREEN
ABO/RH(D): B POS
Antibody Screen: NEGATIVE

## 2011-01-11 LAB — PREPARE RBC (CROSSMATCH)

## 2011-01-11 LAB — DIFFERENTIAL
Basophils Absolute: 0.1 10*3/uL (ref 0.0–0.1)
Basophils Absolute: 0.1 10*3/uL (ref 0.0–0.1)
Basophils Relative: 1 % (ref 0–1)
Basophils Relative: 1 % (ref 0–1)
Eosinophils Absolute: 0 10*3/uL (ref 0.0–0.7)
Eosinophils Absolute: 0.2 10*3/uL (ref 0.0–0.7)
Eosinophils Relative: 0 % (ref 0–5)
Eosinophils Relative: 2 % (ref 0–5)
Lymphocytes Relative: 18 % (ref 12–46)
Lymphocytes Relative: 20 % (ref 12–46)
Lymphs Abs: 1.7 10*3/uL (ref 0.7–4.0)
Lymphs Abs: 1.9 10*3/uL (ref 0.7–4.0)
Monocytes Absolute: 0.5 10*3/uL (ref 0.1–1.0)
Monocytes Absolute: 0.8 10*3/uL (ref 0.1–1.0)
Monocytes Relative: 6 % (ref 3–12)
Monocytes Relative: 8 % (ref 3–12)
Neutro Abs: 6.8 10*3/uL (ref 1.7–7.7)
Neutro Abs: 7.2 10*3/uL (ref 1.7–7.7)
Neutrophils Relative %: 70 % (ref 43–77)
Neutrophils Relative %: 76 % (ref 43–77)

## 2011-01-11 LAB — CBC
HCT: 13.8 % — ABNORMAL LOW (ref 36.0–46.0)
HCT: 15.2 % — ABNORMAL LOW (ref 36.0–46.0)
HCT: 28.3 % — ABNORMAL LOW (ref 36.0–46.0)
Hemoglobin: 4.8 g/dL — CL (ref 12.0–15.0)
Hemoglobin: 5.3 g/dL — CL (ref 12.0–15.0)
Hemoglobin: 9.9 g/dL — ABNORMAL LOW (ref 12.0–15.0)
MCHC: 34.8 g/dL (ref 30.0–36.0)
MCHC: 35 g/dL (ref 30.0–36.0)
MCHC: 35.1 g/dL (ref 30.0–36.0)
MCV: 91.6 fL (ref 78.0–100.0)
MCV: 95.5 fL (ref 78.0–100.0)
MCV: 95.6 fL (ref 78.0–100.0)
Platelets: 561 10*3/uL — ABNORMAL HIGH (ref 150–400)
Platelets: 594 10*3/uL — ABNORMAL HIGH (ref 150–400)
Platelets: 719 10*3/uL — ABNORMAL HIGH (ref 150–400)
RBC: 1.44 MIL/uL — ABNORMAL LOW (ref 3.87–5.11)
RBC: 1.59 MIL/uL — ABNORMAL LOW (ref 3.87–5.11)
RBC: 3.09 MIL/uL — ABNORMAL LOW (ref 3.87–5.11)
RDW: 13.9 % (ref 11.5–15.5)
RDW: 14.7 % (ref 11.5–15.5)
RDW: 16 % — ABNORMAL HIGH (ref 11.5–15.5)
WBC: 10.6 10*3/uL — ABNORMAL HIGH (ref 4.0–10.5)
WBC: 7.9 10*3/uL (ref 4.0–10.5)
WBC: 9.8 10*3/uL (ref 4.0–10.5)

## 2011-01-11 LAB — RETICULOCYTES
RBC.: 1.52 MIL/uL — ABNORMAL LOW (ref 3.87–5.11)
RBC.: 2.68 MIL/uL — ABNORMAL LOW (ref 3.87–5.11)
Retic Count, Absolute: 13.4 10*3/uL — ABNORMAL LOW (ref 19.0–186.0)
Retic Count, Absolute: 4.6 10*3/uL — ABNORMAL LOW (ref 19.0–186.0)
Retic Ct Pct: 0.3 % — ABNORMAL LOW (ref 0.4–3.1)
Retic Ct Pct: 0.5 % (ref 0.4–3.1)

## 2011-01-11 LAB — HEMOGLOBIN AND HEMATOCRIT, BLOOD
HCT: 22.3 % — ABNORMAL LOW (ref 36.0–46.0)
Hemoglobin: 7.8 g/dL — CL (ref 12.0–15.0)

## 2011-01-11 LAB — IRON AND TIBC
Iron: 214 ug/dL — ABNORMAL HIGH (ref 42–135)
Saturation Ratios: 75 % — ABNORMAL HIGH (ref 20–55)
TIBC: 285 ug/dL (ref 250–470)
UIBC: 71 ug/dL

## 2011-01-11 LAB — GLUCOSE, CAPILLARY: Glucose-Capillary: 72 mg/dL (ref 70–99)

## 2011-01-11 LAB — FOLATE: Folate: 17.2 ng/mL

## 2011-01-11 LAB — ABO/RH: ABO/RH(D): B POS

## 2011-01-11 LAB — BILIRUBIN, TOTAL: Total Bilirubin: 0.5 mg/dL (ref 0.3–1.2)

## 2011-01-11 LAB — FERRITIN: Ferritin: 162 ng/mL (ref 10–291)

## 2011-01-11 LAB — VITAMIN B12: Vitamin B-12: 430 pg/mL (ref 211–911)

## 2011-01-11 LAB — LACTATE DEHYDROGENASE: LDH: 136 U/L (ref 94–250)

## 2011-01-12 ENCOUNTER — Other Ambulatory Visit: Payer: Self-pay | Admitting: Hematology & Oncology

## 2011-01-12 ENCOUNTER — Encounter (HOSPITAL_BASED_OUTPATIENT_CLINIC_OR_DEPARTMENT_OTHER): Payer: BC Managed Care – PPO | Admitting: Hematology & Oncology

## 2011-01-12 DIAGNOSIS — D609 Acquired pure red cell aplasia, unspecified: Secondary | ICD-10-CM

## 2011-01-12 DIAGNOSIS — M329 Systemic lupus erythematosus, unspecified: Secondary | ICD-10-CM

## 2011-01-12 DIAGNOSIS — D473 Essential (hemorrhagic) thrombocythemia: Secondary | ICD-10-CM

## 2011-01-12 DIAGNOSIS — D72829 Elevated white blood cell count, unspecified: Secondary | ICD-10-CM

## 2011-01-12 LAB — CBC WITH DIFFERENTIAL (CANCER CENTER ONLY)
BASO#: 0 10*3/uL (ref 0.0–0.2)
BASO%: 0.6 % (ref 0.0–2.0)
EOS%: 4.6 % (ref 0.0–7.0)
Eosinophils Absolute: 0.3 10*3/uL (ref 0.0–0.5)
HCT: 36 % (ref 34.8–46.6)
HGB: 11.8 g/dL (ref 11.6–15.9)
LYMPH#: 1.2 10*3/uL (ref 0.9–3.3)
LYMPH%: 16.2 % (ref 14.0–48.0)
MCH: 30.7 pg (ref 26.0–34.0)
MCHC: 32.8 g/dL (ref 32.0–36.0)
MCV: 94 fL (ref 81–101)
MONO#: 0.7 10*3/uL (ref 0.1–0.9)
MONO%: 9.6 % (ref 0.0–13.0)
NEUT#: 4.9 10*3/uL (ref 1.5–6.5)
NEUT%: 69 % (ref 39.6–80.0)
Platelets: 428 10*3/uL — ABNORMAL HIGH (ref 145–400)
RBC: 3.84 10*6/uL (ref 3.70–5.32)
RDW: 13.3 % (ref 11.1–15.7)
WBC: 7.2 10*3/uL (ref 3.9–10.0)

## 2011-01-12 LAB — IRON AND TIBC
%SAT: 19 % — ABNORMAL LOW (ref 20–55)
Iron: 43 ug/dL (ref 42–145)
TIBC: 226 ug/dL — ABNORMAL LOW (ref 250–470)
UIBC: 183 ug/dL

## 2011-01-12 LAB — RETICULOCYTES (CHCC)
ABS Retic: 42.9 10*3/uL (ref 19.0–186.0)
RBC.: 3.9 MIL/uL (ref 3.87–5.11)
Retic Ct Pct: 1.1 % (ref 0.4–3.1)

## 2011-01-12 LAB — CHCC SATELLITE - SMEAR

## 2011-01-12 LAB — FERRITIN: Ferritin: 414 ng/mL — ABNORMAL HIGH (ref 10–291)

## 2011-02-20 NOTE — Discharge Summary (Signed)
Carolyn Proctor, Carolyn Proctor               ACCOUNT NO.:  [id number]   MEDICAL RECORD NO.:  [id number]          PATIENT TYPE:  INP   LOCATION:  3702                         FACILITY:  [facility]   PHYSICIAN:  Lonia Blood, M.D.       DATE OF BIRTH:  1961-02-24   DATE OF ADMISSION:  07/17/2007  DATE OF DISCHARGE:  07/18/2007                               DISCHARGE SUMMARY   PRIMARY CARE PHYSICIAN:  Dr. Allyne Gee.   DISCHARGE DIAGNOSES:  1. Dizziness, unclear etiology, maybe transient ischemic attack.  2. Systemic lupus erythematosus, does not seem active.  3. Mediastinal adenopathy, unclear etiology.  Recommend consideration      of followup with thoracic surgery as outpatient for biopsy.  4. Right internal carotid stenosis of 40 to 60%, recommend close      followup and consultation of vascular surgery evaluation as an      outpatient.  5. Recurrent episode of dizziness, consideration could be given in the      outpatient to a Holter monitoring.  6. Pulmonary fibrosis.   DISCHARGE MEDICATIONS:  1. Aspirin 325 mg daily.  2. Multivitamin 1 tablet daily.   CONDITION ON DISCHARGE:  Carolyn Proctor was discharged in good condition.  At the time of the discharge the patient was instructed to follow up  with her primary care physician within a week to discuss the mediastinal  adenopathy, the carotid stenosis and the possibility of a Holter.   PROCEDURE:  1. October 9, 20008, the patient underwent transthoracic      echocardiogram with findings of preserved ejection fraction at 505      and abnormal relaxation pattern.  2. Carotid ultrasound with findings of a right internal carotid 40 to      60% stenosis.  3. MRI of the brain negative for stroke.  4. MRA of the brain, negative for intracranial atherosclerosis.  5. Chest CT with intravenous contrast, negative for pulmonary embolus,      positive for honeycombing consistent with pulmonary fibrosis,      positive for mediastinal adenopathy.   CONSULTATIONS:  No consultations obtain.   For history and physical refer to dictation done by Dr. Lavera Guise July 17, 2007.   HOSPITAL COURSE:  1. Dizziness.  Carolyn Proctor was admitted with complaints of dizziness.      She reported that she had bouts of dizziness at home prior to this      admission. A full workup for possibility of TIA was undertaken      without any clear results.  The patient did not have a stroke seen      on MRI.  Her MRA of the brain was fairly within normal limits.  We      have incidentally found the carotid artery stenosis which we doubt      is related to the patient's dizziness episodes.  Carolyn Proctor was      kept on telemetry for 24 hours without any atrial fibrillation      observed.  It is conceivable that with her history of systemic      lupus  erythematosus, she may have episodes of paroxysmal      tachycardia.  For this reason, we would recommend either or an      event monitor of a Holter monitor in the outpatient setting at the      discretion of her primary care physician.  2. Incidental finding of mediastinal adenopathy.  Because on admission      Carolyn Proctor had a fairly elevated D-dimer level, she had the chest      CT. On the chest CT there was extensive mediastinal adenopathy      which could be related to the patient's lupus but could also mean      lymphoma.  I have presented this to the patient and she would like      to discuss with Dr. Allyne Gee, the workup.  I would personally      recommend biopsy through mediastinoscopy through the thoracic      surgeons.  3. Carotid stenosis.  For the incidental finding of the 40 to 60%      carotid artery stenosis, we would recommend aspirin on a day basis      and a followup in 3 to 6 months to see if the stenosis worsens.  4. Systemic lupus erythematosus.  We have obtained a C3, C4, C-      reactive protein levels which were all within normal limits.  At      this point in time, we doubt that the  patient's lupus is active.      For this reason we will not recommend prednisone.  The patient      though should follow up with her primary rheumatologist to resume      hydroxychloroquine.  5. Pulmonary fibrosis.  Carolyn Proctor definitively needs outpatient      pulmonary function tests.  She also would probably benefit from      referral to a tertiary care center which specializes in pulmonary      fibrosis.      Lonia Blood, M.D.  Electronically Signed     SL/MEDQ  D:  07/18/2007  T:  07/18/2007  Job:  [id number]   cc:   Candyce Churn. Allyne Gee, M.D.

## 2011-02-20 NOTE — H&P (Signed)
[name], [name]               ACCOUNT NO.:  [id number]   MEDICAL RECORD NO.:  [id number]          PATIENT TYPE:  EMS   LOCATION:  MAJO                         FACILITY:  [facility]   PHYSICIAN:  Lonia Blood, M.D.       DATE OF BIRTH:  1961-06-27   DATE OF ADMISSION:  07/17/2007  DATE OF DISCHARGE:                              HISTORY & PHYSICAL   PRIMARY CARE PHYSICIAN:  Dr. Allyne Gee.   CHIEF COMPLAINT:  Dizziness.   HISTORY OF PRESENT ILLNESS:  [name] is a 50 year old woman with a  past medical history of systemic lupus erythematosus on no treatment,  who was brought by ambulance today after she had an episode at work  where she had sudden onset of dizziness.  The patient denies having  frank vertigo.  She denies any eye changes but she said that at the time  of the episode she had difficulty finding her words.  The patient was  not weak and she was able to actually stand.  She denies any numbness.  She apparently had some similar episodes over the course of the past 2-3  days but she did not act on them.  The patient currently is not taking  any aspirin.   PAST MEDICAL HISTORY:  1. Systemic lupus erythematosus.  2. Miscarriage.  3. Pulmonary fibrosis.   The patient never had any surgeries.   HOME MEDICATIONS:  Multivitamin daily.   SOCIAL HISTORY:  The patient is a widow.  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 She does not drink alcohol, does not smoke cigarettes,  does not use illicit drugs. She has 3 children, 3 daughters, one 35, one  26 and one 37.   FAMILY HISTORY:  Positive for lupus in mother and brother. Father is  alive and healthy.   REVIEW OF SYSTEMS:  As per HPI and also note that the patient denies any  chest pain.  She does not report shortness of breath.  She does not  report coughing.  She denies abdominal pain, nausea, or vomiting. All  other systems have been reviewed and are negative.   PHYSICAL EXAMINATION:  Upon admission temperature is  97.9, blood  pressure is 135/91, pulse 87, respirations 14, saturation 100% on room  air.  GENERAL APPEARANCE:  A tall, frail, African-American woman in no acute  distress, alert and oriented to place, person and time.  HEENT:  Head  normocephalic, atraumatic.  Eyes, pupils equal, round and reactive to  light and accommodation.  Extraocular movements intact.  No nystagmus.  Conjunctivae are pink.  Sclerae anicteric.  Throat clear.  NECK:  Supple.  No JVD.  No carotid bruits.  CHEST:  Bibasilar dry crackles.  No wheezes, no rhonchi.  HEART:  Regular with occasional ectopic beats.  There is a clear S3  gallop.  No murmurs. There are no rubs.  ABDOMEN:  Soft, nontender, nondistended, bowel sounds are present.  No  palpable hepatosplenomegaly.  LOWER EXTREMITIES:  Have no edema.  NEUROLOGICAL:  Cranial nerves II-XII are intact. Sensation intact in all  for extremities. The  patient's deep tendon reflexes are increased  bilaterally in the upper extremities and +1 bilaterally in the lower  extremities.  Babinski is downgoing.  Strength 5/5 in all for  extremities bilaterally.  PSYCHIATRIC:  The patient is euthymic, intact judgment and good insight.   LABORATORY VALUES ON ADMISSION:  Sodium 143, potassium 3.4, BUN 8,  creatinine 1, myoglobin 60, CK-MB less than 1.  Troponin I less than  0.05.  EKG shows normal sinus rhythm with frequent PACs. Portable chest  x-ray shows chronic changes in the lower lobes bilaterally.   ASSESSMENT/PLAN:  Transient ischemic attack. [name] is at high risk  of developing strokes given her systemic lupus erythematosus.  Her  symptoms are somehow concerning for a possibility of a posterior  circulation TIA.  The patient will be admitted for observation.  She  will have a CT scan of the head as well as MRI, MRA.  Carotid Dopplers  and transthoracic echocardiogram will be obtained.  If the D-dimer is  highly positive then she will have also lower extremity  venous Dopplers.  The patient will be started back on aspirin and antiphospholipid  antibodies will be checked.  C3, C4, ANA and sedimentation rate will be  checked to see the activity of the systemic lupus erythematosus.  Neuro  checks and telemetry monitoring will be pursued.      Lonia Blood, M.D.  Electronically Signed     SL/MEDQ  D:  07/17/2007  T:  07/18/2007  Job:  [id number]   cc:   Candyce Churn. Allyne Gee, M.D.

## 2011-02-20 NOTE — Procedures (Signed)
EEG NUMBER:  [id number]   This is a 50 year old woman with a history of dizziness and heart  palpitations.  The EEG is done presumably to rule out seizure activity  as the source of dizziness.  Medications listed include aspirin.  This  was a routine 17 channel EEG with 1 channel devoted to EKG utilizing  International 10/20 lead placement system.  The patient was described  clinically as doing awake and falling asleep.  Electrographically, she  appeared to be in the waking, drowsy, and light natural sleep states  throughout the course of recording.  While awake, the0 background  consisted of a well-organized, well-developed, well-modulated 10 Hz  alpha activity, which is predominant in the posterior head regions and  reactive to eye opening.  No definite epileptiform activity was seen,  however frequent small, sharp waves were seen emanating from the left  frontotemporal region, chiefly around the F7 and T3 leads.  This is  suggestive of an irritable focus and could serve as the seizure focus,  however no clinical activity was recorded associated with these  discharges. Otherwise, no interhemispheric asymmetry is identified.  Activation procedures included hyperventilation and photic stimulation.  Neither produce any significant change in background activity.  The EKG  monitor reveals relatively regular rhythm with a rate of 66 beats per  minute.   CONCLUSION:  Abnormal EEG in the waking, drowsy, and light natural sleep  states demonstrating frequent small, sharp waves emanating from the left  frontotemporal regions (F7 and T3), which are concerning for possible  irritable focus and could represent an underlying structural irritable  abnormality in the left frontotemporal region.  Clinical correlation is  recommended.      Catherine A. Orlin Hilding, M.D.  Electronically Signed     ZOX:[id number]  D:  08/07/2007 13:13:56  T:  08/08/2007 08:15:52  Job #:  [id number]

## 2011-02-20 NOTE — Procedures (Signed)
EEG [id number].   CLINICAL HISTORY:  The patient is a 50 year old who had episodes of  dizziness at work.  Her right arm shook.  Her blood pressure was  elevated, heart rate erratic.  She ha cardiac dysrhythmia, atrial  fibrillation versus supraventricular tachycardia at the time. (781.0)   PROCEDURE:  The tracing was carried out in 32-channel digital Cadwell  recorder, reformatted into 16-channel montages with 1 devoted to EKG.  The patient was awake, drowsy and asleep during the recording.  The  International 10/20 system lead placement was used.  She takes aspirin.   DESCRIPTION OF FINDINGS:  Dominant frequency is a 15-mV to 30-mV well-  modulated and regulated 10-Hz activity that attenuates partially with  eye-opening.   Background activity is a mixture of upper theta and frontally  predominant beta range components.   The patient makes a very rapid shift into drowsiness in light natural  sleep with generalized delta range activity followed by 14-Hz symmetric  and synchronous sleep spindles in a desynchronized background.  She  shifts back and forth between waking state and drowsiness and natural  sleep at least 2 or 3 three times during the study.   Photic stimulation failed to induce a driving response.  Hyperventilation caused arousal and mild slowing of the background into  the upper theta range.   There was no focal slowing.  There was no interictal epileptiform  activity in the form of spikes or sharp waves.   EKG showed a regular sinus rhythm with ventricular response of 72 beats  per minute.   IMPRESSION:  This study is normal with the patient awake, drowsy and in  natural sleep.      Deanna Artis. Sharene Skeans, M.D.  Electronically Signed     ZHY:[id number]  D:  09/02/2007 09:30:41  T:  09/02/2007 11:53:34  Job #:  [id number]   cc:   Rene Kocher, M.D.  Fax: [phone]

## 2011-06-15 ENCOUNTER — Other Ambulatory Visit: Payer: Self-pay | Admitting: Hematology & Oncology

## 2011-06-15 ENCOUNTER — Encounter (HOSPITAL_BASED_OUTPATIENT_CLINIC_OR_DEPARTMENT_OTHER): Payer: BC Managed Care – PPO | Admitting: Hematology & Oncology

## 2011-06-15 DIAGNOSIS — D72829 Elevated white blood cell count, unspecified: Secondary | ICD-10-CM

## 2011-06-15 DIAGNOSIS — M329 Systemic lupus erythematosus, unspecified: Secondary | ICD-10-CM

## 2011-06-15 DIAGNOSIS — D473 Essential (hemorrhagic) thrombocythemia: Secondary | ICD-10-CM

## 2011-06-15 DIAGNOSIS — D609 Acquired pure red cell aplasia, unspecified: Secondary | ICD-10-CM

## 2011-06-15 LAB — CBC WITH DIFFERENTIAL (CANCER CENTER ONLY)
BASO#: 0 10*3/uL (ref 0.0–0.2)
BASO%: 0.4 % (ref 0.0–2.0)
EOS%: 7.9 % — ABNORMAL HIGH (ref 0.0–7.0)
Eosinophils Absolute: 0.6 10*3/uL — ABNORMAL HIGH (ref 0.0–0.5)
HCT: 37.9 % (ref 34.8–46.6)
HGB: 13 g/dL (ref 11.6–15.9)
LYMPH#: 1.6 10*3/uL (ref 0.9–3.3)
LYMPH%: 23 % (ref 14.0–48.0)
MCH: 31.5 pg (ref 26.0–34.0)
MCHC: 34.3 g/dL (ref 32.0–36.0)
MCV: 92 fL (ref 81–101)
MONO#: 0.7 10*3/uL (ref 0.1–0.9)
MONO%: 10.2 % (ref 0.0–13.0)
NEUT#: 4.1 10*3/uL (ref 1.5–6.5)
NEUT%: 58.5 % (ref 39.6–80.0)
Platelets: 400 10*3/uL (ref 145–400)
RBC: 4.13 10*6/uL (ref 3.70–5.32)
RDW: 12.7 % (ref 11.1–15.7)
WBC: 7 10*3/uL (ref 3.9–10.0)

## 2011-06-15 LAB — CHCC SATELLITE - SMEAR

## 2011-06-16 LAB — DIRECT ANTIGLOBULIN TEST (NOT AT ARMC)
DAT (Complement): NEGATIVE
DAT IgG: NEGATIVE

## 2011-06-16 LAB — RETICULOCYTES (CHCC)
ABS Retic: 37.3 10*3/uL (ref 19.0–186.0)
RBC.: 4.14 MIL/uL (ref 3.87–5.11)
Retic Ct Pct: 0.9 % (ref 0.4–2.3)

## 2011-06-16 LAB — FERRITIN: Ferritin: 290 ng/mL (ref 10–291)

## 2011-06-16 LAB — HAPTOGLOBIN: Haptoglobin: 163 mg/dL (ref 30–200)

## 2011-06-16 LAB — IRON AND TIBC
%SAT: 29 % (ref 20–55)
Iron: 64 ug/dL (ref 42–145)
TIBC: 220 ug/dL — ABNORMAL LOW (ref 250–470)
UIBC: 156 ug/dL (ref 125–400)

## 2011-07-19 LAB — DIFFERENTIAL
Basophils Absolute: 0.1
Basophils Relative: 1
Eosinophils Absolute: 0.3
Eosinophils Relative: 5
Lymphocytes Relative: 27
Lymphs Abs: 1.6
Monocytes Absolute: 0.4
Monocytes Relative: 7
Neutro Abs: 3.6
Neutrophils Relative %: 60

## 2011-07-19 LAB — CK TOTAL AND CKMB (NOT AT ARMC)
CK, MB: 0.7
Relative Index: INVALID
Total CK: 90

## 2011-07-19 LAB — SEDIMENTATION RATE: Sed Rate: 40 — ABNORMAL HIGH

## 2011-07-19 LAB — URINALYSIS, ROUTINE W REFLEX MICROSCOPIC
Glucose, UA: NEGATIVE
Hgb urine dipstick: NEGATIVE
Ketones, ur: 15 — AB
Leukocytes, UA: NEGATIVE
Nitrite: NEGATIVE
Protein, ur: 30 — AB
Specific Gravity, Urine: 1.025
Urobilinogen, UA: 2 — ABNORMAL HIGH
pH: 7.5

## 2011-07-19 LAB — ANTIPHOSPHOLIPID SYNDROME EVAL, BLD
Anticardiolipin IgA: 16 (ref <13)
Anticardiolipin IgG: 8 — ABNORMAL LOW (ref <11)
Anticardiolipin IgM: <7 — ABNORMAL LOW (ref <10)
Antiphosphatidylserine IgA: <20 APS U/mL (ref <20.0)
Antiphosphatidylserine IgG: <11 GPS U/mL (ref <11.0)
Antiphosphatidylserine IgM: <25 MPS U/mL (ref <25.0)
DRVVT: 48.6 — ABNORMAL HIGH (ref 36.1–47.0)
Lupus Anticoagulant: NOT DETECTED
PTT Lupus Anticoagulant: 42.8 (ref 36.3–48.8)
dRVVT Incubated 1:1 Mix: 40.8 (ref 36.1–47.0)

## 2011-07-19 LAB — ANA: Anti Nuclear Antibody(ANA): POSITIVE — AB

## 2011-07-19 LAB — I-STAT 8, (EC8 V) (CONVERTED LAB)
Acid-Base Excess: 1
BUN: 8
Bicarbonate: 22.9
Chloride: 107
Glucose, Bld: 105 — ABNORMAL HIGH
HCT: 37
Hemoglobin: 12.6
Operator id: 198171
Potassium: 3.4 — ABNORMAL LOW
Sodium: 143
TCO2: 24
pCO2, Ven: 29.1 — ABNORMAL LOW
pH, Ven: 7.503 — ABNORMAL HIGH

## 2011-07-19 LAB — COMPREHENSIVE METABOLIC PANEL
ALT: 10
AST: 17
Albumin: 3.3 — ABNORMAL LOW
Alkaline Phosphatase: 72
BUN: 6
CO2: 24
Calcium: 8.8
Chloride: 108
Creatinine, Ser: 0.92
GFR calc Af Amer: >60
GFR calc non Af Amer: >60
Glucose, Bld: 79
Potassium: 3.4 — ABNORMAL LOW
Sodium: 139
Total Bilirubin: 0.5
Total Protein: 7.6

## 2011-07-19 LAB — C-REACTIVE PROTEIN: CRP: 0.3 — ABNORMAL LOW (ref <0.6)

## 2011-07-19 LAB — CBC
HCT: 35.7 — ABNORMAL LOW
Hemoglobin: 12
MCHC: 33.7
MCV: 91.6
Platelets: 434 — ABNORMAL HIGH
RBC: 3.9
RDW: 13.5
WBC: 6

## 2011-07-19 LAB — APTT: aPTT: 35

## 2011-07-19 LAB — PROTIME-INR
INR: 1.5
Prothrombin Time: 18.9 — ABNORMAL HIGH

## 2011-07-19 LAB — RAPID URINE DRUG SCREEN, HOSP PERFORMED
Amphetamines: NOT DETECTED
Barbiturates: NOT DETECTED
Benzodiazepines: NOT DETECTED
Cocaine: NOT DETECTED
Opiates: NOT DETECTED
Tetrahydrocannabinol: NOT DETECTED

## 2011-07-19 LAB — URINE MICROSCOPIC-ADD ON

## 2011-07-19 LAB — HEMOGLOBIN A1C
Hgb A1c MFr Bld: 5.3
Mean Plasma Glucose: 111

## 2011-07-19 LAB — POCT CARDIAC MARKERS
CKMB, poc: <1 — ABNORMAL LOW
CKMB, poc: <1 — ABNORMAL LOW
Myoglobin, poc: 60.6
Myoglobin, poc: 75.4
Operator id: 198171
Operator id: 198171
Troponin i, poc: <0.05
Troponin i, poc: <0.05

## 2011-07-19 LAB — LIPID PANEL
Cholesterol: 137
HDL: 39 — ABNORMAL LOW
LDL Cholesterol: 88
Total CHOL/HDL Ratio: 3.5
Triglycerides: 48
VLDL: 10

## 2011-07-19 LAB — TROPONIN I: Troponin I: 0.01

## 2011-07-19 LAB — C4 COMPLEMENT: Complement C4, Body Fluid: 18

## 2011-07-19 LAB — HOMOCYSTEINE: Homocysteine: 10.8

## 2011-07-19 LAB — POCT I-STAT CREATININE
Creatinine, Ser: 1
Operator id: 198171

## 2011-07-19 LAB — D-DIMER, QUANTITATIVE (NOT AT ARMC): D-Dimer, Quant: 3.51 — ABNORMAL HIGH

## 2011-07-19 LAB — ANTI-NUCLEAR AB-TITER (ANA TITER): ANA Titer 1: 1:40 {titer} — ABNORMAL HIGH

## 2011-07-19 LAB — TSH: TSH: 0.93

## 2011-07-19 LAB — PREGNANCY, URINE: Preg Test, Ur: NEGATIVE

## 2011-07-19 LAB — C3 COMPLEMENT: C3 Complement: 101

## 2011-08-24 ENCOUNTER — Encounter: Payer: Self-pay | Admitting: *Deleted

## 2011-08-24 NOTE — Progress Notes (Signed)
Received referral for pt per Dr. Myna Hidalgo pt is being seen 12-7 here keep that appointment

## 2011-09-14 ENCOUNTER — Other Ambulatory Visit: Payer: Self-pay | Admitting: Hematology & Oncology

## 2011-09-14 ENCOUNTER — Encounter: Payer: Self-pay | Admitting: Hematology & Oncology

## 2011-09-14 ENCOUNTER — Ambulatory Visit (HOSPITAL_BASED_OUTPATIENT_CLINIC_OR_DEPARTMENT_OTHER): Payer: BC Managed Care – PPO | Admitting: Hematology & Oncology

## 2011-09-14 ENCOUNTER — Other Ambulatory Visit (HOSPITAL_BASED_OUTPATIENT_CLINIC_OR_DEPARTMENT_OTHER): Payer: BC Managed Care – PPO | Admitting: Lab

## 2011-09-14 DIAGNOSIS — D473 Essential (hemorrhagic) thrombocythemia: Secondary | ICD-10-CM

## 2011-09-14 DIAGNOSIS — D75839 Thrombocytosis, unspecified: Secondary | ICD-10-CM

## 2011-09-14 DIAGNOSIS — M329 Systemic lupus erythematosus, unspecified: Secondary | ICD-10-CM

## 2011-09-14 DIAGNOSIS — D609 Acquired pure red cell aplasia, unspecified: Secondary | ICD-10-CM | POA: Insufficient documentation

## 2011-09-14 DIAGNOSIS — IMO0002 Reserved for concepts with insufficient information to code with codable children: Secondary | ICD-10-CM | POA: Insufficient documentation

## 2011-09-14 DIAGNOSIS — D72829 Elevated white blood cell count, unspecified: Secondary | ICD-10-CM

## 2011-09-14 HISTORY — DX: Systemic lupus erythematosus, unspecified: M32.9

## 2011-09-14 HISTORY — DX: Acquired pure red cell aplasia, unspecified: D60.9

## 2011-09-14 HISTORY — DX: Thrombocytosis, unspecified: D75.839

## 2011-09-14 HISTORY — DX: Reserved for concepts with insufficient information to code with codable children: IMO0002

## 2011-09-14 LAB — IRON AND TIBC
%SAT: 25 % (ref 20–55)
%SAT: 25 % (ref 20–55)
%SAT: 25 % (ref 20–55)
Iron: 59 ug/dL (ref 42–145)
Iron: 59 ug/dL (ref 42–145)
Iron: 59 ug/dL (ref 42–145)
TIBC: 233 ug/dL — ABNORMAL LOW (ref 250–470)
TIBC: 233 ug/dL — ABNORMAL LOW (ref 250–470)
TIBC: 233 ug/dL — ABNORMAL LOW (ref 250–470)
UIBC: 174 ug/dL (ref 125–400)
UIBC: 174 ug/dL (ref 125–400)
UIBC: 174 ug/dL (ref 125–400)

## 2011-09-14 LAB — FERRITIN: Ferritin: 305 ng/mL — ABNORMAL HIGH (ref 10–291)

## 2011-09-14 LAB — CBC WITH DIFFERENTIAL (CANCER CENTER ONLY)
BASO#: 0 10*3/uL (ref 0.0–0.2)
BASO%: 0.4 % (ref 0.0–2.0)
EOS%: 10.1 % — ABNORMAL HIGH (ref 0.0–7.0)
Eosinophils Absolute: 0.8 10*3/uL — ABNORMAL HIGH (ref 0.0–0.5)
HCT: 37.1 % (ref 34.8–46.6)
HGB: 12.3 g/dL (ref 11.6–15.9)
LYMPH#: 1.4 10*3/uL (ref 0.9–3.3)
LYMPH%: 17.6 % (ref 14.0–48.0)
MCH: 31.7 pg (ref 26.0–34.0)
MCHC: 33.2 g/dL (ref 32.0–36.0)
MCV: 96 fL (ref 81–101)
MONO#: 0.7 10*3/uL (ref 0.1–0.9)
MONO%: 9 % (ref 0.0–13.0)
NEUT#: 4.9 10*3/uL (ref 1.5–6.5)
NEUT%: 62.9 % (ref 39.6–80.0)
Platelets: 436 10*3/uL — ABNORMAL HIGH (ref 145–400)
RBC: 3.88 10*6/uL (ref 3.70–5.32)
RDW: 12.8 % (ref 11.1–15.7)
WBC: 7.7 10*3/uL (ref 3.9–10.0)

## 2011-09-14 LAB — LACTATE DEHYDROGENASE
LDH: 146 U/L (ref 94–250)
LDH: 146 U/L (ref 94–250)
LDH: 146 U/L (ref 94–250)

## 2011-09-14 LAB — RETICULOCYTES (CHCC)
ABS Retic: 50.8 10*3/uL (ref 19.0–186.0)
ABS Retic: 50.8 10*3/uL (ref 19.0–186.0)
RBC.: 3.91 MIL/uL (ref 3.87–5.11)
RBC.: 3.91 MIL/uL (ref 3.87–5.11)
Retic Ct Pct: 1.3 % (ref 0.4–2.3)
Retic Ct Pct: 1.3 % (ref 0.4–2.3)

## 2011-09-14 NOTE — Progress Notes (Signed)
CC:   Carolyn Proctor, M.D.  DIAGNOSES: 1. Pure red blood cell aplasia, clinical remission. 2. Chronic reactive thrombocytosis. 3. Systemic lupus.  CURRENT THERAPY:  Folic acid 1 mg p.o. daily.  INTERVAL HISTORY:  Carolyn Proctor comes in for followup.  She is doing well. She has had no complaints since I last saw her in September.  She is still working.  There is no fatigue or weakness.  She has had no nausea or vomiting.  There has been no flare of her lupus.  She has noticed some change in her monthly cycles.  They are not as long and a little more erratic.  As such, I suspect she is likely going through the change of life.  She has had no cough or shortness breath.  When we last saw her, her direct Coombs was negative.  Her ferritin was 290.  Iron saturation was 29%.  PHYSICAL EXAM:  General: This is a well-developed, well-nourished African American female in no obvious distress.  Vital signs: 97.3, pulse 91, respiratory rate 18, blood pressure 131/82, and weight is 144. Head and neck exam shows a normocephalic, atraumatic skull.  There are no ocular or oral lesions.  There are no palpable cervical or supraclavicular lymph nodes.  Lungs are clear bilaterally.  Cardiac examination:  Regular rhythm with a normal S1 and S2.  There are no murmurs, rubs, or bruits.  Abdominal exam: Soft with good bowel sounds. There is no fluid wave.  There is no palpable abdominal mass.  There is no palpable hepatosplenomegaly.  Back exam:  No tenderness of the spine, ribs, or hips.  Extremities shows no clubbing, cyanosis, or edema.  Skin exam:  No rashes, ecchymosis, or petechia.  LABORATORY STUDIES:  White cell count is 7.7, hemoglobin 12.3, hematocrit 37.1, and platelet count 436.  MCV is 96.  IMPRESSION AND PLAN:  Carolyn Proctor is a 50 year old African American female with a history of blood cell aplasia.  She presented back in, I think, December 2010.  We treated her with prednisone.  With  prednisone alone, she had went into remission.  She has been in remission for close to 2 years.  She will continue to use folic acid. I think she needs this for life.  I do not see any issues with obvious iron deficiency.  We have had this in the past.  We will plan to get her back in 3 more months.  I think we can get her through the wintertime without any blood work in between visits.    ______________________________ Josph Macho, M.D. PRE/MEDQ  D:  09/14/2011  T:  09/14/2011  Job:  [id number]

## 2011-09-14 NOTE — Progress Notes (Signed)
This office note has been dictated.

## 2011-09-15 LAB — IRON AND TIBC
%SAT: 25 % (ref 20–55)
%SAT: 25 % (ref 20–55)
Iron: 59 ug/dL (ref 42–145)
Iron: 59 ug/dL (ref 42–145)
TIBC: 233 ug/dL — ABNORMAL LOW (ref 250–470)
TIBC: 233 ug/dL — ABNORMAL LOW (ref 250–470)
UIBC: 174 ug/dL (ref 125–400)
UIBC: 174 ug/dL (ref 125–400)

## 2011-09-15 LAB — RETICULOCYTES (CHCC)
ABS Retic: 50.8 10*3/uL (ref 19.0–186.0)
ABS Retic: 50.8 10*3/uL (ref 19.0–186.0)
RBC.: 3.91 MIL/uL (ref 3.87–5.11)
RBC.: 3.91 MIL/uL (ref 3.87–5.11)
Retic Ct Pct: 1.3 % (ref 0.4–2.3)
Retic Ct Pct: 1.3 % (ref 0.4–2.3)

## 2011-09-15 LAB — LACTATE DEHYDROGENASE
LDH: 146 U/L (ref 94–250)
LDH: 146 U/L (ref 94–250)

## 2011-09-15 LAB — HAPTOGLOBIN
Haptoglobin: 171 mg/dL (ref 30–200)
Haptoglobin: 171 mg/dL (ref 30–200)

## 2011-09-15 LAB — DIRECT ANTIGLOBULIN TEST (NOT AT ARMC)
DAT (Complement): NEGATIVE
DAT IgG: NEGATIVE

## 2011-09-15 LAB — FERRITIN
Ferritin: 305 ng/mL — ABNORMAL HIGH (ref 10–291)
Ferritin: 305 ng/mL — ABNORMAL HIGH (ref 10–291)

## 2011-12-14 ENCOUNTER — Other Ambulatory Visit (HOSPITAL_BASED_OUTPATIENT_CLINIC_OR_DEPARTMENT_OTHER): Payer: BC Managed Care – PPO | Admitting: Lab

## 2011-12-14 ENCOUNTER — Ambulatory Visit (HOSPITAL_BASED_OUTPATIENT_CLINIC_OR_DEPARTMENT_OTHER): Payer: BC Managed Care – PPO | Admitting: Hematology & Oncology

## 2011-12-14 VITALS — BP 140/87 | HR 88 | Temp 97.0°F | Ht 71.5 in | Wt 147.0 lb

## 2011-12-14 DIAGNOSIS — M329 Systemic lupus erythematosus, unspecified: Secondary | ICD-10-CM

## 2011-12-14 DIAGNOSIS — D473 Essential (hemorrhagic) thrombocythemia: Secondary | ICD-10-CM

## 2011-12-14 DIAGNOSIS — D609 Acquired pure red cell aplasia, unspecified: Secondary | ICD-10-CM

## 2011-12-14 DIAGNOSIS — D75839 Thrombocytosis, unspecified: Secondary | ICD-10-CM

## 2011-12-14 LAB — IRON AND TIBC
%SAT: 14 % — ABNORMAL LOW (ref 20–55)
Iron: 33 ug/dL — ABNORMAL LOW (ref 42–145)
TIBC: 230 ug/dL — ABNORMAL LOW (ref 250–470)
UIBC: 197 ug/dL (ref 125–400)

## 2011-12-14 LAB — RETICULOCYTES (CHCC)
ABS Retic: 64.5 10*3/uL (ref 19.0–186.0)
RBC.: 4.03 MIL/uL (ref 3.87–5.11)
Retic Ct Pct: 1.6 % (ref 0.4–2.3)

## 2011-12-14 LAB — FERRITIN: Ferritin: 224 ng/mL (ref 10–291)

## 2011-12-14 LAB — CBC WITH DIFFERENTIAL (CANCER CENTER ONLY)
BASO#: 0 10*3/uL (ref 0.0–0.2)
BASO%: 0.5 % (ref 0.0–2.0)
EOS%: 6.8 % (ref 0.0–7.0)
Eosinophils Absolute: 0.4 10*3/uL (ref 0.0–0.5)
HCT: 38.1 % (ref 34.8–46.6)
HGB: 12.4 g/dL (ref 11.6–15.9)
LYMPH#: 1.2 10*3/uL (ref 0.9–3.3)
LYMPH%: 19.4 % (ref 14.0–48.0)
MCH: 31.2 pg (ref 26.0–34.0)
MCHC: 32.5 g/dL (ref 32.0–36.0)
MCV: 96 fL (ref 81–101)
MONO#: 0.6 10*3/uL (ref 0.1–0.9)
MONO%: 9.6 % (ref 0.0–13.0)
NEUT#: 4.1 10*3/uL (ref 1.5–6.5)
NEUT%: 63.7 % (ref 39.6–80.0)
Platelets: 377 10*3/uL (ref 145–400)
RBC: 3.98 10*6/uL (ref 3.70–5.32)
RDW: 13 % (ref 11.1–15.7)
WBC: 6.4 10*3/uL (ref 3.9–10.0)

## 2011-12-14 LAB — LACTATE DEHYDROGENASE: LDH: 143 U/L (ref 94–250)

## 2011-12-14 MED ORDER — FOLIC ACID 1 MG PO TABS: 1.0000 mg | ORAL_TABLET | Freq: Every day | ORAL | Status: DC

## 2011-12-14 NOTE — Progress Notes (Signed)
CC:   Robyn N. Allyne Gee, M.D.  DIAGNOSIS: 1. Red cell aplasia-clinical remission. 2. Systemic lupus. 3. Chronic-benign-thrombocytosis.  CURRENT THERAPY:  Folic acid 1 mg p.o. daily.  INTERVAL HISTORY:  Ms. Sann comes in for her followup.  We last saw her back in December.  She has been doing well.  She is going to school. She is working pretty much full time for [employer].  There has been no problems with fatigue.  She has had no bleeding.  She does have some arthralgias from her lupus.  She has had no change in appetite.  There has been no weight loss or weight gain.  She is trying to exercise.  She is having no problem with fevers.  When we last saw her, her lab work looked real good.  She had a ferritin of 305.  Iron saturation was 25%.  Her haptoglobin was 171.  PHYSICAL EXAM:  General: This is a well-developed, well-nourished, black female in no obvious distress.  Vital signs: Show a temperature of 97, pulse 88, respiratory rate 18, blood pressure 140/87, and weight is 147. Head and neck exam shows a normocephalic, atraumatic skull.  There are no ocular or oral lesions.  No palpable cervical or supraclavicular lymph nodes.  Lungs are clear bilaterally.  Cardiac examination: Regular rate and rhythm with a normal S1 and S2.  There are no murmurs, rubs, or bruits.  Abdominal exam: Soft with good bowel sounds.  There is no palpable abdominal mass.  There is no fluid wave.  There is no palpable hepatosplenomegaly.  Back exam:  No tenderness over the spine, ribs, or hips or hips.  Extremities: Shows no clubbing, cyanosis, or edema.  Skin exam: No rashes, ecchymosis, or petechia.  LABORATORIES:  White cell count 6.4, hemoglobin 12.4, hematocrit 38.1, and platelet count 377.  MCV is 96.  IMPRESSION:  Ms. Vollrath is a very nice 51 year old African American female with pure red blood cell aplasia.  This, in my mind, was a part of her whole lupus complex.  She presented back  in December 2010.  We treated her with prednisone and she basically got into remission.  I do believe folic acid is still very important for her.  I told the sterile folic acid.  I sent a prescription to her pharmacy for this.  I think we can probably get her back in 4 months now.  I do not see that we need to do blood work in between visits.    ______________________________ Josph Macho, M.D. PRE/MEDQ  D:  12/14/2011  T:  12/14/2011  Job:  [id number]

## 2012-04-09 ENCOUNTER — Other Ambulatory Visit (HOSPITAL_BASED_OUTPATIENT_CLINIC_OR_DEPARTMENT_OTHER): Payer: BC Managed Care – PPO | Admitting: Lab

## 2012-04-09 ENCOUNTER — Ambulatory Visit (HOSPITAL_BASED_OUTPATIENT_CLINIC_OR_DEPARTMENT_OTHER): Payer: BC Managed Care – PPO | Admitting: Hematology & Oncology

## 2012-04-09 VITALS — BP 133/87 | HR 81 | Temp 97.0°F | Ht 70.0 in | Wt 145.0 lb

## 2012-04-09 DIAGNOSIS — D609 Acquired pure red cell aplasia, unspecified: Secondary | ICD-10-CM

## 2012-04-09 DIAGNOSIS — D509 Iron deficiency anemia, unspecified: Secondary | ICD-10-CM

## 2012-04-09 LAB — RETICULOCYTES (CHCC)
ABS Retic: 31.7 10*3/uL (ref 19.0–186.0)
RBC.: 3.96 MIL/uL (ref 3.87–5.11)
Retic Ct Pct: 0.8 % (ref 0.4–2.3)

## 2012-04-09 LAB — CBC WITH DIFFERENTIAL (CANCER CENTER ONLY)
BASO#: 0 10*3/uL (ref 0.0–0.2)
BASO%: 0.7 % (ref 0.0–2.0)
EOS%: 9 % — ABNORMAL HIGH (ref 0.0–7.0)
Eosinophils Absolute: 0.5 10*3/uL (ref 0.0–0.5)
HCT: 37 % (ref 34.8–46.6)
HGB: 12.3 g/dL (ref 11.6–15.9)
LYMPH#: 1.4 10*3/uL (ref 0.9–3.3)
LYMPH%: 23.7 % (ref 14.0–48.0)
MCH: 31.6 pg (ref 26.0–34.0)
MCHC: 33.2 g/dL (ref 32.0–36.0)
MCV: 95 fL (ref 81–101)
MONO#: 0.6 10*3/uL (ref 0.1–0.9)
MONO%: 9.8 % (ref 0.0–13.0)
NEUT#: 3.4 10*3/uL (ref 1.5–6.5)
NEUT%: 56.8 % (ref 39.6–80.0)
Platelets: 399 10*3/uL (ref 145–400)
RBC: 3.89 10*6/uL (ref 3.70–5.32)
RDW: 12.5 % (ref 11.1–15.7)
WBC: 5.9 10*3/uL (ref 3.9–10.0)

## 2012-04-09 LAB — CHCC SATELLITE - SMEAR

## 2012-04-09 NOTE — Progress Notes (Signed)
This office note has been dictated.

## 2012-04-10 LAB — RETICULOCYTES (CHCC)
ABS Retic: 31.7 10*3/uL (ref 19.0–186.0)
RBC.: 3.96 MIL/uL (ref 3.87–5.11)
Retic Ct Pct: 0.8 % (ref 0.4–2.3)

## 2012-04-10 LAB — DIRECT ANTIGLOBULIN TEST (NOT AT ARMC)
DAT (Complement): NEGATIVE
DAT IgG: NEGATIVE

## 2012-04-10 LAB — FERRITIN: Ferritin: 242 ng/mL (ref 10–291)

## 2012-04-10 LAB — IRON AND TIBC
%SAT: 26 % (ref 20–55)
Iron: 60 ug/dL (ref 42–145)
TIBC: 231 ug/dL — ABNORMAL LOW (ref 250–470)
UIBC: 171 ug/dL (ref 125–400)

## 2012-04-10 NOTE — Progress Notes (Signed)
CC:   Robyn N. Allyne Gee, M.D.  DIAGNOSES: 1. Pure red blood cell aplasia, remission. 2. Chronic thrombocytosis. 3. Collagen vascular disease (lupus versus rheumatoid arthritis.)  CURRENT THERAPY:  Folic acid 1 mg p.o. daily.  INTERIM HISTORY:  Ms. Caisse comes in for followup.  She is doing well. She is working.  She is having no complaints.  She does have occasional arthralgias.  She has had no unusual skin rashes.  She does state when she gets her monthly cycles, she does have a lot of discomfort in her abdomen.  She has had no headache.  She has had no leg swelling.  She is still working [employer].  She is also going to school.  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  When we last saw her in March, her ferritin was 224 with an iron saturation of 14.  PHYSICAL EXAMINATION:  This is a tall, thin black female in no obvious distress.  Vital signs:  97.8, pulse 81, respiratory rate 18, blood pressure 133/87.  Weight is 145.  Head and neck:  Normocephalic, atraumatic skull.  There are no ocular or oral lesions.  There are no palpable cervical or supraclavicular lymph nodes.  Lungs:  Clear to percussion and auscultation bilaterally.  Cardiac:  Regular rate and rhythm with a normal S1 and S2.  There are no murmurs, rubs or bruits. Abdomen:  Soft with good bowel sounds.  There is no palpable abdominal mass.  There is no palpable hepatosplenomegaly.  Back:  No tenderness over the spine, ribs, or hips.  Extremities:  No clubbing, cyanosis or edema.  She has good range motion of her joints.  There is no joint erythema, warmth or swelling.  Skin:  No rashes, ecchymosis or petechia.  LABORATORY STUDIES:  White cell count is 6.4, hemoglobin 12.3, hematocrit 37.9, platelet count is 399.  MCV is 96.  IMPRESSION:  Ms. Flansburg is a 51 year old African American female with history of pure red blood cell aplasia.  She presented in December 2010. She got into remission with  prednisone.  I think she also may have gotten IVIG while she was in the hospital.  She is on folic acid, which is doing a good job for from my point of view.  We will plan to get her back in 6 months' time now.  I do not see that we need to do any blood work in between visits.  I do like to follow her iron studies.  This provides some extra insight for [place].    ______________________________ Josph Macho, M.D. PRE/MEDQ  D:  04/09/2012  T:  04/10/2012  Job:  [id number]

## 2012-04-15 ENCOUNTER — Telehealth: Payer: Self-pay | Admitting: *Deleted

## 2012-04-15 NOTE — Telephone Encounter (Signed)
Called patient on personal answering machine to let her know that her labwork all looked great per dr. Myna Hidalgo

## 2012-04-15 NOTE — Telephone Encounter (Signed)
Message copied by Anselm Jungling on Tue Apr 15, 2012  9:53 AM ------      Message from: Arlan Organ R      Created: Mon Apr 14, 2012  6:04 PM       Please call and tell her that her labs look real good. Thanks. Cindee Lame

## 2012-04-25 ENCOUNTER — Ambulatory Visit: Payer: BC Managed Care – PPO | Admitting: Hematology & Oncology

## 2012-04-25 ENCOUNTER — Other Ambulatory Visit: Payer: BC Managed Care – PPO | Admitting: Lab

## 2012-07-11 ENCOUNTER — Encounter (HOSPITAL_COMMUNITY): Payer: Self-pay | Admitting: Emergency Medicine

## 2012-07-11 ENCOUNTER — Encounter (HOSPITAL_COMMUNITY): Payer: Self-pay | Admitting: *Deleted

## 2012-07-11 ENCOUNTER — Emergency Department (HOSPITAL_COMMUNITY)
Admission: EM | Admit: 2012-07-11 | Discharge: 2012-07-12 | Disposition: A | Discharge status: Home / Self Care | Payer: BC Managed Care – PPO | Attending: Emergency Medicine | Admitting: Emergency Medicine

## 2012-07-11 ENCOUNTER — Emergency Department (HOSPITAL_COMMUNITY)
Admission: EM | Admit: 2012-07-11 | Discharge: 2012-07-11 | Disposition: A | Discharge status: Nursing Home (Includes ICF) | Payer: BC Managed Care – PPO | Source: Home / Self Care | Attending: Emergency Medicine | Admitting: Emergency Medicine

## 2012-07-11 DIAGNOSIS — R10813 Right lower quadrant abdominal tenderness: Secondary | ICD-10-CM | POA: Insufficient documentation

## 2012-07-11 DIAGNOSIS — R197 Diarrhea, unspecified: Secondary | ICD-10-CM | POA: Insufficient documentation

## 2012-07-11 DIAGNOSIS — R509 Fever, unspecified: Secondary | ICD-10-CM | POA: Insufficient documentation

## 2012-07-11 DIAGNOSIS — R109 Unspecified abdominal pain: Secondary | ICD-10-CM

## 2012-07-11 DIAGNOSIS — M549 Dorsalgia, unspecified: Secondary | ICD-10-CM | POA: Insufficient documentation

## 2012-07-11 DIAGNOSIS — J8410 Pulmonary fibrosis, unspecified: Secondary | ICD-10-CM | POA: Insufficient documentation

## 2012-07-11 DIAGNOSIS — R112 Nausea with vomiting, unspecified: Secondary | ICD-10-CM | POA: Insufficient documentation

## 2012-07-11 HISTORY — DX: Essential (primary) hypertension: I10

## 2012-07-11 LAB — POCT URINALYSIS DIP (DEVICE)
Glucose, UA: NEGATIVE mg/dL
Hgb urine dipstick: NEGATIVE
Leukocytes, UA: NEGATIVE
Nitrite: POSITIVE — AB
Protein, ur: 30 mg/dL — AB
Specific Gravity, Urine: >=1.03 (ref 1.005–1.030)
Urobilinogen, UA: 1 mg/dL (ref 0.0–1.0)
pH: 5.5 (ref 5.0–8.0)

## 2012-07-11 LAB — LIPASE, BLOOD: Lipase: 16 U/L (ref 11–59)

## 2012-07-11 LAB — POCT PREGNANCY, URINE: Preg Test, Ur: NEGATIVE

## 2012-07-11 LAB — COMPREHENSIVE METABOLIC PANEL
ALT: 10 U/L (ref 0–35)
AST: 16 U/L (ref 0–37)
Albumin: 3.5 g/dL (ref 3.5–5.2)
Alkaline Phosphatase: 75 U/L (ref 39–117)
BUN: 14 mg/dL (ref 6–23)
CO2: 25 mEq/L (ref 19–32)
Calcium: 9.6 mg/dL (ref 8.4–10.5)
Chloride: 102 mEq/L (ref 96–112)
Creatinine, Ser: 0.63 mg/dL (ref 0.50–1.10)
GFR calc Af Amer: >90 mL/min (ref >90)
GFR calc non Af Amer: >90 mL/min (ref >90)
Glucose, Bld: 112 mg/dL — ABNORMAL HIGH (ref 70–99)
Potassium: 3.8 mEq/L (ref 3.5–5.1)
Sodium: 136 mEq/L (ref 135–145)
Total Bilirubin: 0.2 mg/dL — ABNORMAL LOW (ref 0.3–1.2)
Total Protein: 9 g/dL — ABNORMAL HIGH (ref 6.0–8.3)

## 2012-07-11 LAB — CBC WITH DIFFERENTIAL/PLATELET
Basophils Absolute: 0 10*3/uL (ref 0.0–0.1)
Basophils Relative: 0 % (ref 0–1)
Eosinophils Absolute: 0 10*3/uL (ref 0.0–0.7)
Eosinophils Relative: 0 % (ref 0–5)
HCT: 35.4 % — ABNORMAL LOW (ref 36.0–46.0)
Hemoglobin: 11.8 g/dL — ABNORMAL LOW (ref 12.0–15.0)
Lymphocytes Relative: 11 % — ABNORMAL LOW (ref 12–46)
Lymphs Abs: 1.1 10*3/uL (ref 0.7–4.0)
MCH: 30.8 pg (ref 26.0–34.0)
MCHC: 33.3 g/dL (ref 30.0–36.0)
MCV: 92.4 fL (ref 78.0–100.0)
Monocytes Absolute: 0.2 10*3/uL (ref 0.1–1.0)
Monocytes Relative: 2 % — ABNORMAL LOW (ref 3–12)
Neutro Abs: 9 10*3/uL — ABNORMAL HIGH (ref 1.7–7.7)
Neutrophils Relative %: 87 % — ABNORMAL HIGH (ref 43–77)
Platelets: 429 10*3/uL — ABNORMAL HIGH (ref 150–400)
RBC: 3.83 MIL/uL — ABNORMAL LOW (ref 3.87–5.11)
RDW: 13 % (ref 11.5–15.5)
WBC: 10.4 10*3/uL (ref 4.0–10.5)

## 2012-07-11 MED ORDER — ONDANSETRON HCL 4 MG/2ML IJ SOLN
4.0000 mg | Freq: Once | INTRAMUSCULAR | Status: AC
  Administered 2012-07-11: 4 mg via INTRAVENOUS
  Filled 2012-07-11: qty 2

## 2012-07-11 MED ORDER — HYDROMORPHONE HCL PF 1 MG/ML IJ SOLN
1.0000 mg | Freq: Once | INTRAMUSCULAR | Status: AC
  Administered 2012-07-11: 1 mg via INTRAVENOUS
  Filled 2012-07-11: qty 1

## 2012-07-11 MED ORDER — SODIUM CHLORIDE 0.9 % IV BOLUS (SEPSIS)
1000.0000 mL | Freq: Once | INTRAVENOUS | Status: AC
  Administered 2012-07-11: 1000 mL via INTRAVENOUS

## 2012-07-11 MED ORDER — IOHEXOL 300 MG/ML  SOLN
20.0000 mL | INTRAMUSCULAR | Status: AC
  Administered 2012-07-11: 20 mL via ORAL

## 2012-07-11 NOTE — ED Notes (Signed)
Pt is here for abdominal pain since 1330 today... Sx include: vomiting, diarrhea, chills, dizziness .... Denies: fevers.

## 2012-07-11 NOTE — ED Provider Notes (Signed)
History     CSN: [id number]  Arrival date & time 07/11/12  1850   First MD Initiated Contact with Patient 07/11/12 2152      Chief Complaint  Patient presents with  . Abdominal Pain    (Consider location/radiation/quality/duration/timing/severity/associated sxs/prior treatment) HPI Comments: 51 y/o female presents to ED from [facility] complaining of sudden onset RLQ abdominal pain at 1:00 this afternoon while at work. States she was sitting at her desk when the pain began. She immediately had to go to the bathroom and became nauseated, vomited and had diarrhea. Describes the pain as constant, sharp, rated 9/10 at onset, decreasing to 6/10 upon arrival to [facility]. originally pain was non radiating, but now is radiating around to the right side of her back. She has been vomiting and having diarrhea "a lot" for the rest of the day. States she is unable to keep anything down. Admits to associated cold sweats. She was feeling fine prior to onset of symptoms. Denies chest pain, sob, lightheadedness, dizziness, vaginal bleeding or discharge, urinary symptoms. Normal menses. No hx of abdominal surgeries.  The history is provided by the patient.    Past Medical History  Diagnosis Date  . 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 Lupus 09/14/2011  . Hypertension     History reviewed. No pertinent past surgical history.  No family history on file.  History  Substance Use Topics  . Smoking status: Never Smoker   . Smokeless tobacco: Not on file  . Alcohol Use: No    OB History    Grav Para Term Preterm Abortions TAB SAB Ect Mult Living                  Review of Systems  Constitutional: Positive for fever, chills and diaphoresis.  HENT: Negative for neck pain and neck stiffness.   Respiratory: Negative for shortness of breath.   Cardiovascular: Negative for chest pain.  Gastrointestinal: Positive for nausea, vomiting, abdominal pain and diarrhea. Negative for blood in  stool.  Genitourinary: Negative for dysuria, vaginal bleeding, vaginal discharge, difficulty urinating, vaginal pain, menstrual problem and pelvic pain.  Musculoskeletal: Positive for back pain.  Skin: Negative for color change and rash.  Neurological: Negative for dizziness, syncope and light-headedness.  Psychiatric/Behavioral: Negative for confusion.    Allergies  Review of patient's allergies indicates no known allergies.  Home Medications   Current Outpatient Rx  Name Route Sig Dispense Refill  . CALCIUM 500 + D PO Oral Take by mouth.    . FOLIC ACID 1 MG PO TABS Oral Take 1 tablet (1 mg total) by mouth daily. 30 tablet 12  . IBUPROFEN 200 MG PO TABS Oral Take 200 mg by mouth every 6 (six) hours as needed.      . MULTIVITAMINS PO CAPS Oral Take 1 capsule by mouth daily.      BP 121/73  Pulse 64  Temp 98 F (36.7 C) (Oral)  Resp 20  SpO2 100%  Physical Exam  Nursing note and vitals reviewed. Constitutional: She is oriented to person, place, and time. She appears well-developed and well-nourished. No distress.  HENT:  Head: Normocephalic and atraumatic.  Mouth/Throat: Oropharynx is clear and moist.  Eyes: Conjunctivae normal are normal.  Neck: Normal range of motion. Neck supple.  Cardiovascular: Normal rate, regular rhythm, normal heart sounds and intact distal pulses.   Pulmonary/Chest: Effort normal and breath sounds normal. No respiratory distress.  Abdominal: Soft. Normal appearance and bowel sounds are  normal. There is tenderness in the right lower quadrant. There is tenderness at McBurney's point. There is no rigidity, no rebound, no guarding, no CVA tenderness and negative Murphy's sign.  Musculoskeletal: Normal range of motion.  Neurological: She is alert and oriented to person, place, and time.  Skin: Skin is warm. No rash noted.       clammy  Psychiatric: She has a normal mood and affect. Her behavior is normal.    ED Course  Procedures (including critical  care time)  Labs Reviewed  CBC WITH DIFFERENTIAL - Abnormal; Notable for the following:    RBC 3.83 (*)     Hemoglobin 11.8 (*)     HCT 35.4 (*)     Platelets 429 (*)     Neutrophils Relative 87 (*)     Neutro Abs 9.0 (*)     Lymphocytes Relative 11 (*)     Monocytes Relative 2 (*)     All other components within normal limits  COMPREHENSIVE METABOLIC PANEL - Abnormal; Notable for the following:    Glucose, Bld 112 (*)     Total Protein 9.0 (*)     Total Bilirubin 0.2 (*)     All other components within normal limits  LIPASE, BLOOD  URINALYSIS, ROUTINE W REFLEX MICROSCOPIC   No results found.   No diagnosis found.    MDM  51 y/o female presenting with sudden onset RLQ abdominal pain associated with n/v/d. Sent over to ED from [facility]. Very tender to palpation of RLQ. Patient feels clammy. Obtaining CT scan to r/o appy. 1:01 AM Case discussed with Sharen Hones, NP who will take over care of patient at this time.       Trevor Mace, PA-C 07/12/12 0102

## 2012-07-11 NOTE — ED Notes (Signed)
Tolerating PO contrast for CT abd.  RLQ pain constant sharp and worsens with positioning.

## 2012-07-11 NOTE — ED Provider Notes (Signed)
Chief Complaint  Patient presents with  . Abdominal Pain    History of Present Illness:    Carolyn Proctor is a 51 year old female with multiple medical comorbidities including aplastic anemia, lupus, valvular heart disease, and rheumatoid arthritis. She presents today with an a history of sudden onset around 1:30 PM today of severe right flank pain rated 6/10 in intensity. This does not radiate and the pain has not shifted. There no precipitating or relieving factors. The pain has gotten worse since onset. It has been associated with nausea, vomiting, and loose stools. She denies any blood in the stool. She denies any hematemesis, coffee-ground emesis, or bilious emesis. She said her vomitus was somewhat pink. She's felt chilled but does not have a fever. She's felt dizzy and lightheaded and has noted slight urinary urgency but no dysuria, frequency, or hematuria. She denies any GYN complaints such as discharge or itching. Her last menses began September 10. She's not sexually active. She sees Dr. Arlan Organ for her aplastic anemia. Her last CBC was in July and she said it looked normal.  Review of Systems:  Other than noted above, the patient denies any of the following symptoms: Constitutional:  No fever, chills, fatigue, weight loss or anorexia. Lungs:  No cough or shortness of breath. Heart:  No chest pain, palpitations, syncope or edema.  No cardiac history. Abdomen:  No nausea, vomiting, hematememesis, melena, diarrhea, or hematochezia. GU:  No dysuria, frequency, urgency, or hematuria. Gyn:  No vaginal discharge, itching, abnormal bleeding, dyspareunia, or pelvic pain.  PMFSH:  Past medical history, family history, social history, meds, and allergies were reviewed along with nurse's notes.  No prior abdominal surgeries, past history of GI problems, STDs or GYN problems.  No history of aspirin or NSAID use.  No excessive alcohol intake.  Physical Exam:   Vital signs:  BP 144/71  Pulse 66   Temp 97.7 F (36.5 C) (Oral)  Resp 18  SpO2 100% Gen:  Alert, oriented, she comes in in a wheelchair and has agreed to difficulty getting up onto the exam table. She is slumped down in the wheel chair and appears uncomfortable. Lungs:  Breath sounds clear and equal bilaterally.  No wheezes, rales or rhonchi. Heart:  Regular rhythm.  No gallops or murmers.   Abdomen:  Abdomen is soft, flat, and nondistended. Bowel sounds are normally active. There is moderate tenderness to palpation in the right flank and right upper quadrant. There is no tenderness to palpation in the left side of the abdomen or in the right lower quadrant. There is slight guarding but no rebound. Murphy sign and Murphy's punch were negative. No hepatosplenomegaly or mass. Skin:  Clear, warm and dry.  No rash.  Assessment:  The encounter diagnosis was Right flank pain.  Differential diagnosis includes early appendicitis, cholecystitis, diverticulitis, acute pyelonephritis, kidney stone. The patient is a very high risk due to multiple medical comorbidities. I believe a definitive diagnosis is of utmost importance at this time. I feel she needs further evaluation the emergency department.  Plan:   1.  The following meds were prescribed:   New Prescriptions   No medications on file   2.  The patient was transferred to the emergency department via shuttle.   Reuben Likes, MD 07/11/12 [phone]

## 2012-07-11 NOTE — ED Notes (Signed)
The pt has had abd pain since 1300 today with n diarrhea.  She was [facility].  lmp sept 10

## 2012-07-11 NOTE — ED Notes (Signed)
Patient C/O RLQ abdominal pain that began at 1330 today.  Also C/O nausea , vomiting and diarrhea.  Abdomen is round , soft and tender to palpation. C/O having some nausea at present.

## 2012-07-11 NOTE — ED Notes (Signed)
PER [facility]-comes c/o right flank, RUQ pain. R/o appendicitis, kidney stone. Vitals stable. Arrived by shuttle.

## 2012-07-12 ENCOUNTER — Emergency Department (HOSPITAL_COMMUNITY): Payer: BC Managed Care – PPO

## 2012-07-12 ENCOUNTER — Encounter (HOSPITAL_COMMUNITY): Payer: Self-pay | Admitting: Radiology

## 2012-07-12 LAB — URINALYSIS, ROUTINE W REFLEX MICROSCOPIC
Bilirubin Urine: NEGATIVE
Glucose, UA: NEGATIVE mg/dL
Hgb urine dipstick: NEGATIVE
Ketones, ur: 15 mg/dL — AB
Nitrite: NEGATIVE
Protein, ur: NEGATIVE mg/dL
Specific Gravity, Urine: 1.009 (ref 1.005–1.030)
Urobilinogen, UA: 0.2 mg/dL (ref 0.0–1.0)
pH: 5.5 (ref 5.0–8.0)

## 2012-07-12 LAB — URINE MICROSCOPIC-ADD ON

## 2012-07-12 LAB — PREGNANCY, URINE: Preg Test, Ur: NEGATIVE

## 2012-07-12 MED ORDER — TRAMADOL HCL 50 MG PO TABS: 50.0000 mg | ORAL_TABLET | Freq: Four times a day (QID) | ORAL | Status: DC | PRN

## 2012-07-12 MED ORDER — NAPROXEN 500 MG PO TABS: 500.0000 mg | ORAL_TABLET | Freq: Two times a day (BID) | ORAL | Status: DC

## 2012-07-12 MED ORDER — IOHEXOL 300 MG/ML  SOLN
80.0000 mL | Freq: Once | INTRAMUSCULAR | Status: AC | PRN
  Administered 2012-07-12: 80 mL via INTRAVENOUS

## 2012-07-12 NOTE — ED Notes (Signed)
Pt feeling better, declined to wait longer for RX for nausea meds

## 2012-07-12 NOTE — ED Notes (Signed)
Completed PO contrast.  Radiology notified

## 2012-07-12 NOTE — ED Notes (Signed)
Pt waiting for script for nausea meds from Dondra Spry, NP

## 2012-07-12 NOTE — ED Provider Notes (Signed)
  Physical Exam  BP 145/74  Pulse 67  Temp 98 F (36.7 C) (Oral)  Resp 20  SpO2 96%  LMP 06/17/2012  Physical Exam  ED Course  Procedures  MDM She received at change of shift, abdominal pain sent from urgent care. She has soft abdomen with minimal tenderness in the lower bilateral abdomen, CT scan reviewed showing no signs of appendicitis, patient appears stable for discharge. I have informed her that she needs to return within 12-24 hours for a repeat abdominal exam is a having pain and she has expressed her understanding.      Carolyn Roller, MD 07/12/12 [phone]

## 2012-07-12 NOTE — ED Provider Notes (Signed)
Medical screening examination/treatment/procedure(s) were conducted as a shared visit with non-physician practitioner(s) and myself.  I personally evaluated the patient during the encounter  RLQ pain since 1300, nonradiating associated with nausea, vomiting, diarrhea.  Mild TTP R side of abdomen without peritoneal signs.  Glynn Octave, MD 07/12/12 1239

## 2012-10-06 ENCOUNTER — Other Ambulatory Visit (HOSPITAL_BASED_OUTPATIENT_CLINIC_OR_DEPARTMENT_OTHER): Payer: BC Managed Care – PPO | Admitting: Lab

## 2012-10-06 ENCOUNTER — Ambulatory Visit (HOSPITAL_BASED_OUTPATIENT_CLINIC_OR_DEPARTMENT_OTHER): Payer: BC Managed Care – PPO | Admitting: Hematology & Oncology

## 2012-10-06 VITALS — BP 167/75 | HR 99 | Temp 99.2°F | Resp 16 | Ht 70.0 in | Wt 142.0 lb

## 2012-10-06 DIAGNOSIS — D609 Acquired pure red cell aplasia, unspecified: Secondary | ICD-10-CM

## 2012-10-06 DIAGNOSIS — D473 Essential (hemorrhagic) thrombocythemia: Secondary | ICD-10-CM

## 2012-10-06 DIAGNOSIS — D75839 Thrombocytosis, unspecified: Secondary | ICD-10-CM

## 2012-10-06 DIAGNOSIS — D509 Iron deficiency anemia, unspecified: Secondary | ICD-10-CM

## 2012-10-06 LAB — IRON AND TIBC
%SAT: 13 % — ABNORMAL LOW (ref 20–55)
Iron: 32 ug/dL — ABNORMAL LOW (ref 42–145)
TIBC: 251 ug/dL (ref 250–470)
UIBC: 219 ug/dL (ref 125–400)

## 2012-10-06 LAB — FERRITIN: Ferritin: 215 ng/mL (ref 10–291)

## 2012-10-06 LAB — CBC WITH DIFFERENTIAL (CANCER CENTER ONLY)
BASO#: 0 10*3/uL (ref 0.0–0.2)
BASO%: 0.5 % (ref 0.0–2.0)
EOS%: 8.9 % — ABNORMAL HIGH (ref 0.0–7.0)
Eosinophils Absolute: 0.6 10*3/uL — ABNORMAL HIGH (ref 0.0–0.5)
HCT: 38.7 % (ref 34.8–46.6)
HGB: 12.5 g/dL (ref 11.6–15.9)
LYMPH#: 1.4 10*3/uL (ref 0.9–3.3)
LYMPH%: 22.5 % (ref 14.0–48.0)
MCH: 30.9 pg (ref 26.0–34.0)
MCHC: 32.3 g/dL (ref 32.0–36.0)
MCV: 96 fL (ref 81–101)
MONO#: 0.5 10*3/uL (ref 0.1–0.9)
MONO%: 8.4 % (ref 0.0–13.0)
NEUT#: 3.8 10*3/uL (ref 1.5–6.5)
NEUT%: 59.7 % (ref 39.6–80.0)
Platelets: 392 10*3/uL (ref 145–400)
RBC: 4.04 10*6/uL (ref 3.70–5.32)
RDW: 12.9 % (ref 11.1–15.7)
WBC: 6.3 10*3/uL (ref 3.9–10.0)

## 2012-10-06 LAB — RETICULOCYTES (CHCC)
ABS Retic: 41.8 10*3/uL (ref 19.0–186.0)
RBC.: 4.18 MIL/uL (ref 3.87–5.11)
Retic Ct Pct: 1 % (ref 0.4–2.3)

## 2012-10-06 LAB — CHCC SATELLITE - SMEAR

## 2012-10-06 NOTE — Progress Notes (Signed)
This office note has been dictated.

## 2012-10-07 NOTE — Progress Notes (Signed)
CC:   Carolyn Proctor, M.D.  DIAGNOSIS: 1. Red cell aplasia-clinical remission. 2. Collagen vascular disease-non specific. 3. Thrombocytosis-reactive.  CURRENT THERAPY:  Folic acid 1 mg p.o. daily.  INTERIM HISTORY:  Carolyn Proctor comes in for followup. Her main problem now is her left knee.  This is causing a lot of issues.  She is seeing one of the orthopedic surgeons next week.  She is still working.  She works at [employer].  She is quite busy there.  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  We have been monitoring her labs, particularly her iron studies.  When we last saw her in July, her ferritin was 242 with an iron saturation of 26%.  She has had no bleeding or bruising.  She has had no fatigue or weakness.  She has had no rashes.  PHYSICAL EXAMINATION:  General:  This is a well-developed, well- nourished, African American female in no obvious distress.  Vital signs: Temperature of 99.2, pulse 99, respiratory rate 18, blood pressure 167/75.  Weight is 142.  Head and neck:  Normocephalic, atraumatic skull.  There are no ocular or oral lesions.  There are no palpable cervical or supraclavicular lymph nodes.  Lungs:  Clear bilaterally. Cardiac:  Regular rate and rhythm with a normal S1 and S2.  There are no murmurs, rubs or bruits.  Abdomen:  Soft with good bowel sounds.  There is no palpable abdominal mass.  There is no fluid wave.  No palpable hepatosplenomegaly.  Back:  No tenderness over the spine, ribs, or hips. Extremities:  Shows no clubbing, cyanosis or edema.  She does have some swelling and slight warmth in the left knee.  She has some decreased range motion of the left knee.  LABORATORY STUDIES:  White cell count is 6.3, hemoglobin 12.5, hematocrit 38.7, platelet count 392.  MCV is 96.  IMPRESSION:  Carolyn Proctor is a 51 year old, African American female with history pure red blood cell aplasia.  She was treated with  steroids. She has been in remission now for about 3 years, if not longer.  She presented with this back in December of 2009.   We will go ahead and get her back in 6 months again.  I think if things look okay at that point in time, we might be able to go to 1 year followup.    ______________________________ Josph Macho, M.D. PRE/MEDQ  D:  10/06/2012  T:  10/07/2012  Job:  [id number]

## 2012-10-13 ENCOUNTER — Telehealth: Payer: Self-pay | Admitting: *Deleted

## 2012-10-13 ENCOUNTER — Other Ambulatory Visit: Payer: Self-pay | Admitting: *Deleted

## 2012-10-13 DIAGNOSIS — D509 Iron deficiency anemia, unspecified: Secondary | ICD-10-CM

## 2012-10-13 NOTE — Telephone Encounter (Signed)
Called patient to let her know that her iron was low.  Told patient she could call our office to schedule this and if she wanted to talk to the nurses to call us and we can tell her more about it.  Left message on personal cell phone

## 2012-10-15 ENCOUNTER — Telehealth: Payer: Self-pay | Admitting: Hematology & Oncology

## 2012-10-15 NOTE — Telephone Encounter (Signed)
Left pt message to call and schedule appointment. (she needs iron) Did not leave specifics on voice mail.

## 2012-10-16 ENCOUNTER — Telehealth: Payer: Self-pay | Admitting: Hematology & Oncology

## 2012-10-16 NOTE — Telephone Encounter (Signed)
Per RN to sch iron apt when patient calls back.  Patient called and sch for 10/23/12

## 2012-10-23 ENCOUNTER — Ambulatory Visit (HOSPITAL_BASED_OUTPATIENT_CLINIC_OR_DEPARTMENT_OTHER): Payer: BC Managed Care – PPO

## 2012-10-23 VITALS — BP 138/79 | HR 84 | Temp 96.8°F | Resp 20

## 2012-10-23 DIAGNOSIS — D509 Iron deficiency anemia, unspecified: Secondary | ICD-10-CM

## 2012-10-23 MED ORDER — FERUMOXYTOL INJECTION 510 MG/17 ML
1020.0000 mg | Freq: Once | INTRAVENOUS | Status: AC
  Administered 2012-10-23: 1020 mg via INTRAVENOUS
  Filled 2012-10-23: qty 34

## 2012-10-23 MED ORDER — ACETAMINOPHEN 325 MG PO TABS
650.0000 mg | ORAL_TABLET | Freq: Once | ORAL | Status: AC
  Administered 2012-10-23: 650 mg via ORAL

## 2012-10-23 MED ORDER — SODIUM CHLORIDE 0.9 % IV SOLN
Freq: Once | INTRAVENOUS | Status: AC
  Administered 2012-10-23: 09:00:00 via INTRAVENOUS

## 2012-10-23 NOTE — Patient Instructions (Signed)
Ferumoxytol injection What is this medicine? FERUMOXYTOL is an iron complex. Iron is used to make healthy red blood cells, which carry oxygen and nutrients throughout the body. This medicine is used to treat iron deficiency anemia in people with chronic kidney disease. This medicine may be used for other purposes; ask your health care provider or pharmacist if you have questions. What should I tell my health care provider before I take this medicine? They need to know if you have any of these conditions: -anemia not caused by low iron levels -high levels of iron in the blood -magnetic resonance imaging (MRI) test scheduled -an unusual or allergic reaction to iron, other medicines, foods, dyes, or preservatives -pregnant or trying to get pregnant -breast-feeding How should I use this medicine? This medicine is for infusion into a vein. It is given by a health care professional in a hospital or clinic setting. Talk to your pediatrician regarding the use of this medicine in children. Special care may be needed. Overdosage: If you think you've taken too much of this medicine contact a poison control center or emergency room at once. Overdosage: If you think you have taken too much of this medicine contact a poison control center or emergency room at once. NOTE: This medicine is only for you. Do not share this medicine with others. What if I miss a dose? It is important not to miss your dose. Call your doctor or health care professional if you are unable to keep an appointment. What may interact with this medicine? This medicine may interact with the following medications: -other iron products This list may not describe all possible interactions. Give your health care provider a list of all the medicines, herbs, non-prescription drugs, or dietary supplements you use. Also tell them if you smoke, drink alcohol, or use illegal drugs. Some items may interact with your medicine. What should I watch  for while using this medicine? Visit your doctor or healthcare professional regularly. Tell your doctor or healthcare professional if your symptoms do not start to get better or if they get worse. You may need blood work done while you are taking this medicine. You may need to follow a special diet. Talk to your doctor. Foods that contain iron include: whole grains/cereals, dried fruits, beans, or peas, leafy green vegetables, and organ meats (liver, kidney). What side effects may I notice from receiving this medicine? Side effects that you should report to your doctor or health care professional as soon as possible: -allergic reactions like skin rash, itching or hives, swelling of the face, lips, or tongue -breathing problems -changes in blood pressure -feeling faint or lightheaded, falls -fever or chills -flushing, sweating, or hot feelings -swelling of the ankles or feet Side effects that usually do not require medical attention (Report these to your doctor or health care professional if they continue or are bothersome.): -diarrhea -headache -nausea, vomiting -stomach pain This list may not describe all possible side effects. Call your doctor for medical advice about side effects. You may report side effects to FDA at 1-800-FDA-1088. Where should I keep my medicine? This drug is given in a hospital or clinic and will not be stored at home. NOTE: This sheet is a summary. It may not cover all possible information. If you have questions about this medicine, talk to your doctor, pharmacist, or health care provider.  2012, Elsevier/Gold Standard. (06/16/2008 9:48:25 PM) 

## 2013-01-16 ENCOUNTER — Emergency Department (HOSPITAL_COMMUNITY)
Admission: EM | Admit: 2013-01-16 | Discharge: 2013-01-16 | Disposition: A | Discharge status: Home / Self Care | Payer: BC Managed Care – PPO | Attending: Emergency Medicine | Admitting: Emergency Medicine

## 2013-01-16 ENCOUNTER — Emergency Department (HOSPITAL_COMMUNITY)
Admission: EM | Admit: 2013-01-16 | Discharge: 2013-01-16 | Disposition: A | Discharge status: Nursing Home (Includes ICF) | Payer: BC Managed Care – PPO | Source: Home / Self Care

## 2013-01-16 ENCOUNTER — Emergency Department (HOSPITAL_COMMUNITY): Payer: BC Managed Care – PPO

## 2013-01-16 ENCOUNTER — Encounter (HOSPITAL_COMMUNITY): Payer: Self-pay | Admitting: *Deleted

## 2013-01-16 ENCOUNTER — Other Ambulatory Visit: Payer: Self-pay

## 2013-01-16 ENCOUNTER — Encounter (HOSPITAL_COMMUNITY): Payer: Self-pay | Admitting: Emergency Medicine

## 2013-01-16 DIAGNOSIS — Z3202 Encounter for pregnancy test, result negative: Secondary | ICD-10-CM | POA: Insufficient documentation

## 2013-01-16 DIAGNOSIS — Z79899 Other long term (current) drug therapy: Secondary | ICD-10-CM | POA: Insufficient documentation

## 2013-01-16 DIAGNOSIS — R42 Dizziness and giddiness: Secondary | ICD-10-CM

## 2013-01-16 DIAGNOSIS — F8089 Other developmental disorders of speech and language: Secondary | ICD-10-CM

## 2013-01-16 DIAGNOSIS — R51 Headache: Secondary | ICD-10-CM

## 2013-01-16 DIAGNOSIS — I10 Essential (primary) hypertension: Secondary | ICD-10-CM | POA: Insufficient documentation

## 2013-01-16 DIAGNOSIS — F809 Developmental disorder of speech and language, unspecified: Secondary | ICD-10-CM

## 2013-01-16 DIAGNOSIS — M329 Systemic lupus erythematosus, unspecified: Secondary | ICD-10-CM

## 2013-01-16 DIAGNOSIS — Z862 Personal history of diseases of the blood and blood-forming organs and certain disorders involving the immune mechanism: Secondary | ICD-10-CM | POA: Insufficient documentation

## 2013-01-16 DIAGNOSIS — Z8739 Personal history of other diseases of the musculoskeletal system and connective tissue: Secondary | ICD-10-CM | POA: Insufficient documentation

## 2013-01-16 DIAGNOSIS — Z8719 Personal history of other diseases of the digestive system: Secondary | ICD-10-CM | POA: Insufficient documentation

## 2013-01-16 DIAGNOSIS — R55 Syncope and collapse: Secondary | ICD-10-CM | POA: Insufficient documentation

## 2013-01-16 HISTORY — DX: Diverticulitis of intestine, part unspecified, without perforation or abscess without bleeding: K57.92

## 2013-01-16 LAB — CBC WITH DIFFERENTIAL/PLATELET
Basophils Absolute: 0 10*3/uL (ref 0.0–0.1)
Basophils Relative: 0 % (ref 0–1)
Eosinophils Absolute: 0.6 10*3/uL (ref 0.0–0.7)
Eosinophils Relative: 10 % — ABNORMAL HIGH (ref 0–5)
HCT: 38.3 % (ref 36.0–46.0)
Hemoglobin: 13.2 g/dL (ref 12.0–15.0)
Lymphocytes Relative: 31 % (ref 12–46)
Lymphs Abs: 1.9 10*3/uL (ref 0.7–4.0)
MCH: 32 pg (ref 26.0–34.0)
MCHC: 34.5 g/dL (ref 30.0–36.0)
MCV: 92.7 fL (ref 78.0–100.0)
Monocytes Absolute: 0.6 10*3/uL (ref 0.1–1.0)
Monocytes Relative: 10 % (ref 3–12)
Neutro Abs: 3 10*3/uL (ref 1.7–7.7)
Neutrophils Relative %: 50 % (ref 43–77)
Platelets: 329 10*3/uL (ref 150–400)
RBC: 4.13 MIL/uL (ref 3.87–5.11)
RDW: 13.4 % (ref 11.5–15.5)
WBC: 6.1 10*3/uL (ref 4.0–10.5)

## 2013-01-16 LAB — COMPREHENSIVE METABOLIC PANEL
ALT: 12 U/L (ref 0–35)
AST: 17 U/L (ref 0–37)
Albumin: 3.6 g/dL (ref 3.5–5.2)
Alkaline Phosphatase: 69 U/L (ref 39–117)
BUN: 16 mg/dL (ref 6–23)
CO2: 26 mEq/L (ref 19–32)
Calcium: 9.3 mg/dL (ref 8.4–10.5)
Chloride: 102 mEq/L (ref 96–112)
Creatinine, Ser: 0.82 mg/dL (ref 0.50–1.10)
GFR calc Af Amer: >90 mL/min (ref >90)
GFR calc non Af Amer: 83 mL/min — ABNORMAL LOW (ref >90)
Glucose, Bld: 100 mg/dL — ABNORMAL HIGH (ref 70–99)
Potassium: 3.8 mEq/L (ref 3.5–5.1)
Sodium: 135 mEq/L (ref 135–145)
Total Bilirubin: 0.3 mg/dL (ref 0.3–1.2)
Total Protein: 8.3 g/dL (ref 6.0–8.3)

## 2013-01-16 LAB — URINE MICROSCOPIC-ADD ON

## 2013-01-16 LAB — URINALYSIS, ROUTINE W REFLEX MICROSCOPIC
Glucose, UA: NEGATIVE mg/dL
Hgb urine dipstick: NEGATIVE
Ketones, ur: 15 mg/dL — AB
Nitrite: NEGATIVE
Protein, ur: NEGATIVE mg/dL
Specific Gravity, Urine: 1.027 (ref 1.005–1.030)
Urobilinogen, UA: 1 mg/dL (ref 0.0–1.0)
pH: 6 (ref 5.0–8.0)

## 2013-01-16 LAB — PREGNANCY, URINE: Preg Test, Ur: NEGATIVE

## 2013-01-16 NOTE — ED Provider Notes (Signed)
History     CSN: [id number]  Arrival date & time 01/16/13  1448   None     Chief Complaint  Patient presents with  . Dizziness    (Consider location/radiation/quality/duration/timing/severity/associated sxs/prior treatment) HPI Comments: 52 year old female states while at work at 12:30 PM today she experienced an acute onset of dizziness while sitting at her desk. This was associated with babbling speech, tingling of the mouth and headache. Some of these symptoms lasted from your seconds. Now she has mild headache in her occiput and "feels cloudy". She has a history of hypertension, lupus, or a hand hospitalizations for anemia requiring transfusions. Her 66 year old brother has a history of CVA.   Past Medical History  Diagnosis Date  . 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 Lupus 09/14/2011  . Hypertension     History reviewed. No pertinent past surgical history.  No family history on file.  History  Substance Use Topics  . Smoking status: Never Smoker   . Smokeless tobacco: Not on file  . Alcohol Use: No    OB History   Grav Para Term Preterm Abortions TAB SAB Ect Mult Living                  Review of Systems  Constitutional: Positive for fatigue.  HENT: Negative for ear pain, nosebleeds, congestion, sore throat, trouble swallowing, neck pain and neck stiffness.   Respiratory: Negative.   Cardiovascular: Positive for palpitations.  Gastrointestinal: Negative.   Genitourinary: Negative.   Musculoskeletal: Negative.   Neurological: Positive for dizziness, speech difficulty, light-headedness, numbness and headaches.    Allergies  Review of patient's allergies indicates no known allergies.  Home Medications   Current Outpatient Rx  Name  Route  Sig  Dispense  Refill  . Calcium Carbonate-Vitamin D (CALCIUM 500 + D PO)   Oral   Take 1 tablet by mouth daily.          . folic acid (FOLVITE) 1 MG tablet   Oral   Take 1 mg by mouth  daily.         Marland Kitchen ibuprofen (ADVIL,MOTRIN) 200 MG tablet   Oral   Take 400 mg by mouth every 6 (six) hours as needed. For pain         . Multiple Vitamin (MULTIVITAMIN WITH MINERALS) TABS   Oral   Take 1 tablet by mouth daily.         . naproxen (NAPROSYN) 500 MG tablet   Oral   Take 1 tablet (500 mg total) by mouth 2 (two) times daily with a meal.   30 tablet   0   . traMADol (ULTRAM) 50 MG tablet   Oral   Take 1 tablet (50 mg total) by mouth every 6 (six) hours as needed for pain.   15 tablet   0   . Vitamin D, Ergocalciferol, (DRISDOL) 50000 UNITS CAPS   Oral   Take by mouth 2 (two) times a week.            BP 127/89  Pulse 87  Temp(Src) 97 F (36.1 C) (Oral)  Physical Exam  Nursing note and vitals reviewed. Constitutional: She is oriented to person, place, and time. She appears well-developed and well-nourished. No distress.  HENT:  Mouth/Throat: Oropharynx is clear and moist. No oropharyngeal exudate.  Eyes: Conjunctivae and EOM are normal. Pupils are equal, round, and reactive to light.  Neck: Normal range of motion. Neck supple.  Cardiovascular: Normal rate,  regular rhythm and normal heart sounds.   Pulmonary/Chest: Effort normal and breath sounds normal. No respiratory distress.  Abdominal: Soft. She exhibits no distension. There is no tenderness.  Musculoskeletal: Normal range of motion. She exhibits no edema and no tenderness.  Lymphadenopathy:    She has no cervical adenopathy.  Neurological: She is alert and oriented to person, place, and time. No cranial nerve deficit or sensory deficit. She exhibits normal muscle tone. She displays no seizure activity.  Skin: Skin is warm and dry. No rash noted. No erythema.  Psychiatric: She has a normal mood and affect.    ED Course  Procedures (including critical care time)  Labs Reviewed - No data to display No results found.   1. Dizziness   2. Speech babble   3. Headache   4. HTN (hypertension)    5. Lupus (systemic lupus erythematosus)       MDM  52 year old female patient being transferred to the emergency department via shuttle for an event at occurring this afternoon having food symptoms of acute onset of dizziness, speech babble, tingling around the mouth, headache and feeling as though she is in a fog. History of hypertension, lupus and anemia requiring transfusions. Family history includes brother with CVA age 93. Differentials include possible TIA, physical manifestations of anxiety, arrhythmias and anemia, and others. At the time of transfer she is fully awake alert and in no acute distress and asymptomatic with exception of "feeling as though she is in a fog".  Carolyn Rasmussen, NP 01/16/13 [phone]

## 2013-01-16 NOTE — ED Notes (Signed)
The pt  Has had dizziness while at work since 1230 today .   No  nv or diarrhea.  Dizziness in the past for a low hgb

## 2013-01-16 NOTE — ED Notes (Signed)
C/o episode of dizziness today.  Patient had not had breakfast at the time of incident.  Felt dizzy, numbness around mouth.  Currently reports feeling better, just feels " like i am in a fog".  Currently has a dull headache in forehead.

## 2013-01-16 NOTE — ED Provider Notes (Signed)
Medical screening examination/treatment/procedure(s) were performed by resident physician or non-physician practitioner and as supervising physician I was immediately available for consultation/collaboration.   Barkley Bruns MD.   Linna Hoff, MD 01/16/13 [phone]

## 2013-01-16 NOTE — ED Provider Notes (Signed)
History     CSN: [id number]  Arrival date & time 01/16/13  1711   First MD Initiated Contact with Patient 01/16/13 1906      Chief Complaint  Patient presents with  . Dizziness    (Consider location/radiation/quality/duration/timing/severity/associated sxs/prior treatment) HPI Pt reports she was sitting at work earlier today in her normal state of health when she began to feel dizzy, lightheaded. States she felt a vein in her L arm throbbing and tingling around her mouth. She states these symptoms lasted 2-3 seconds and resolved spontaneously.  She states she had trouble getting her words out for a few seconds as well but that also resolved quickly. She is back to her baseline now. She went to [facility] initially where she also reported feeling foggy in the front of her head. She denies any facial droop, arm or leg weakness or numbness.   Past Medical History  Diagnosis Date  . 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 Lupus 09/14/2011  . Hypertension   . Diverticulitis     History reviewed. No pertinent past surgical history.  History reviewed. No pertinent family history.  History  Substance Use Topics  . Smoking status: Never Smoker   . Smokeless tobacco: Not on file  . Alcohol Use: No    OB History   Grav Para Term Preterm Abortions TAB SAB Ect Mult Living                  Review of Systems All other systems reviewed and are negative except as noted in HPI.   Allergies  Review of patient's allergies indicates no known allergies.  Home Medications   Current Outpatient Rx  Name  Route  Sig  Dispense  Refill  . Calcium Carbonate-Vitamin D (CALCIUM 500 + D PO)   Oral   Take 1 tablet by mouth daily.          . folic acid (FOLVITE) 1 MG tablet   Oral   Take 1 mg by mouth daily.         Marland Kitchen ibuprofen (ADVIL,MOTRIN) 200 MG tablet   Oral   Take 400 mg by mouth every 6 (six) hours as needed. For pain         . Multiple Vitamin (MULTIVITAMIN  WITH MINERALS) TABS   Oral   Take 1 tablet by mouth daily.         . naproxen (NAPROSYN) 500 MG tablet   Oral   Take 1 tablet (500 mg total) by mouth 2 (two) times daily with a meal.   30 tablet   0   . traMADol (ULTRAM) 50 MG tablet   Oral   Take 1 tablet (50 mg total) by mouth every 6 (six) hours as needed for pain.   15 tablet   0   . Vitamin D, Ergocalciferol, (DRISDOL) 50000 UNITS CAPS   Oral   Take by mouth 2 (two) times a week.            BP 147/85  Pulse 75  Temp(Src) 97.7 F (36.5 C)  Resp 18  SpO2 100%  LMP 11/18/2012  Physical Exam  Nursing note and vitals reviewed. Constitutional: She is oriented to person, place, and time. She appears well-developed and well-nourished.  HENT:  Head: Normocephalic and atraumatic.  Eyes: EOM are normal. Pupils are equal, round, and reactive to light.  Neck: Normal range of motion. Neck supple. Carotid bruit is not present.  Cardiovascular: Normal rate, normal  heart sounds and intact distal pulses.   Pulmonary/Chest: Effort normal and breath sounds normal.  Abdominal: Bowel sounds are normal. She exhibits no distension. There is no tenderness.  Musculoskeletal: Normal range of motion. She exhibits no edema and no tenderness.  Neurological: She is alert and oriented to person, place, and time. She has normal strength. She displays normal reflexes. No cranial nerve deficit or sensory deficit. Coordination normal.  Skin: Skin is warm and dry. No rash noted.  Psychiatric: She has a normal mood and affect.    ED Course  Procedures (including critical care time)  Labs Reviewed  CBC WITH DIFFERENTIAL - Abnormal; Notable for the following:    Eosinophils Relative 10 (*)    All other components within normal limits  COMPREHENSIVE METABOLIC PANEL - Abnormal; Notable for the following:    Glucose, Bld 100 (*)    GFR calc non Af Amer 83 (*)    All other components within normal limits  URINALYSIS, ROUTINE W REFLEX  MICROSCOPIC - Abnormal; Notable for the following:    APPearance CLOUDY (*)    Bilirubin Urine SMALL (*)    Ketones, ur 15 (*)    Leukocytes, UA MODERATE (*)    All other components within normal limits  URINE MICROSCOPIC-ADD ON - Abnormal; Notable for the following:    Squamous Epithelial / LPF FEW (*)    Bacteria, UA FEW (*)    All other components within normal limits  URINE CULTURE  PREGNANCY, URINE   Ct Head Wo Contrast  01/16/2013  *RADIOLOGY REPORT*  Clinical Data: 52 year old female with dizziness, numbness.  CT HEAD WITHOUT CONTRAST  Technique:  Contiguous axial images were obtained from the base of the skull through the vertex without contrast.  Comparison: Head CT and brain MRI 07/17/2007.  Findings: Visualized paranasal sinuses and mastoids are clear.  No acute osseous abnormality identified.  Visualized orbits and scalp soft tissues are within normal limits.  Cerebral volume has not significantly changed.  No ventriculomegaly. Gray-white matter differentiation is within normal limits throughout the brain.  No evidence of cortically based acute infarction identified.  No midline shift, mass effect, or evidence of mass lesion.  No acute intracranial hemorrhage identified.  IMPRESSION: Stable and normal noncontrast CT appearance of the brain.   Original Report Authenticated By: Erskine Speed, M.D.      1. Near syncope       MDM  Labs ordered in triage reviewed and unremarkable. Will add CT Head, but doubt this is TIA or stroke given brief duration of symptoms, no focal neuro deficits by report. She has family history of CVA.   8:10 PM Pt with neg CT. Review of the medical record reveals a very similar episode in 2008 which included an extensive evaluation with MRI/MRA, CTA, carotid dopplers, etc. All of which was negative. I suspect these symptoms are more of a near-syncope type episode than TIA. Advised to begin ASA as a precaution and to return to the ED for any return of  symptoms. PCP follow up on Monday.      Elira Colasanti B. Bernette Mayers, MD 01/16/13 2016

## 2013-01-18 LAB — URINE CULTURE: Colony Count: 55000

## 2013-02-22 ENCOUNTER — Emergency Department (HOSPITAL_COMMUNITY): Payer: BC Managed Care – PPO

## 2013-02-22 ENCOUNTER — Inpatient Hospital Stay (HOSPITAL_COMMUNITY)
Admission: EM | Admit: 2013-02-22 | Discharge: 2013-02-25 | DRG: 124 | Disposition: A | Discharge status: Home / Self Care | Payer: BC Managed Care – PPO | Attending: Cardiovascular Disease | Admitting: Cardiovascular Disease

## 2013-02-22 ENCOUNTER — Encounter (HOSPITAL_COMMUNITY): Payer: Self-pay | Admitting: *Deleted

## 2013-02-22 DIAGNOSIS — I491 Atrial premature depolarization: Secondary | ICD-10-CM | POA: Diagnosis present

## 2013-02-22 DIAGNOSIS — K5730 Diverticulosis of large intestine without perforation or abscess without bleeding: Secondary | ICD-10-CM | POA: Diagnosis present

## 2013-02-22 DIAGNOSIS — R079 Chest pain, unspecified: Secondary | ICD-10-CM

## 2013-02-22 DIAGNOSIS — R0781 Pleurodynia: Secondary | ICD-10-CM

## 2013-02-22 DIAGNOSIS — D75839 Thrombocytosis, unspecified: Secondary | ICD-10-CM | POA: Diagnosis present

## 2013-02-22 DIAGNOSIS — J8410 Pulmonary fibrosis, unspecified: Secondary | ICD-10-CM | POA: Diagnosis present

## 2013-02-22 DIAGNOSIS — D473 Essential (hemorrhagic) thrombocythemia: Secondary | ICD-10-CM | POA: Diagnosis present

## 2013-02-22 DIAGNOSIS — Z79899 Other long term (current) drug therapy: Secondary | ICD-10-CM

## 2013-02-22 DIAGNOSIS — M329 Systemic lupus erythematosus, unspecified: Secondary | ICD-10-CM | POA: Diagnosis present

## 2013-02-22 DIAGNOSIS — I4891 Unspecified atrial fibrillation: Secondary | ICD-10-CM

## 2013-02-22 DIAGNOSIS — IMO0002 Reserved for concepts with insufficient information to code with codable children: Secondary | ICD-10-CM | POA: Diagnosis present

## 2013-02-22 DIAGNOSIS — R7989 Other specified abnormal findings of blood chemistry: Secondary | ICD-10-CM | POA: Diagnosis present

## 2013-02-22 DIAGNOSIS — M3589 Other specified systemic involvement of connective tissue: Secondary | ICD-10-CM | POA: Diagnosis present

## 2013-02-22 DIAGNOSIS — I498 Other specified cardiac arrhythmias: Secondary | ICD-10-CM | POA: Diagnosis present

## 2013-02-22 DIAGNOSIS — R778 Other specified abnormalities of plasma proteins: Secondary | ICD-10-CM | POA: Diagnosis present

## 2013-02-22 DIAGNOSIS — I471 Supraventricular tachycardia: Secondary | ICD-10-CM

## 2013-02-22 DIAGNOSIS — I309 Acute pericarditis, unspecified: Secondary | ICD-10-CM | POA: Diagnosis present

## 2013-02-22 DIAGNOSIS — M069 Rheumatoid arthritis, unspecified: Secondary | ICD-10-CM | POA: Diagnosis present

## 2013-02-22 DIAGNOSIS — I4719 Other supraventricular tachycardia: Secondary | ICD-10-CM

## 2013-02-22 DIAGNOSIS — I10 Essential (primary) hypertension: Secondary | ICD-10-CM | POA: Diagnosis present

## 2013-02-22 DIAGNOSIS — M358 Other specified systemic involvement of connective tissue: Secondary | ICD-10-CM | POA: Diagnosis present

## 2013-02-22 DIAGNOSIS — I214 Non-ST elevation (NSTEMI) myocardial infarction: Secondary | ICD-10-CM

## 2013-02-22 DIAGNOSIS — D609 Acquired pure red cell aplasia, unspecified: Secondary | ICD-10-CM | POA: Diagnosis present

## 2013-02-22 HISTORY — DX: Unspecified atrial fibrillation: I48.91

## 2013-02-22 HISTORY — DX: Pleurodynia: R07.81

## 2013-02-22 HISTORY — DX: Other supraventricular tachycardia: I47.19

## 2013-02-22 HISTORY — DX: Pulmonary fibrosis, unspecified: J84.10

## 2013-02-22 HISTORY — DX: Supraventricular tachycardia: I47.1

## 2013-02-22 LAB — CBC
HCT: 37.8 % (ref 36.0–46.0)
Hemoglobin: 13 g/dL (ref 12.0–15.0)
MCH: 33 pg (ref 26.0–34.0)
MCHC: 34.4 g/dL (ref 30.0–36.0)
MCV: 95.9 fL (ref 78.0–100.0)
Platelets: 368 10*3/uL (ref 150–400)
RBC: 3.94 MIL/uL (ref 3.87–5.11)
RDW: 12.7 % (ref 11.5–15.5)
WBC: 10.2 10*3/uL (ref 4.0–10.5)

## 2013-02-22 LAB — BASIC METABOLIC PANEL
BUN: 10 mg/dL (ref 6–23)
CO2: 20 mEq/L (ref 19–32)
Calcium: 8.8 mg/dL (ref 8.4–10.5)
Chloride: 101 mEq/L (ref 96–112)
Creatinine, Ser: 0.62 mg/dL (ref 0.50–1.10)
GFR calc Af Amer: >90 mL/min (ref >90)
GFR calc non Af Amer: >90 mL/min (ref >90)
Glucose, Bld: 103 mg/dL — ABNORMAL HIGH (ref 70–99)
Potassium: 3.4 mEq/L — ABNORMAL LOW (ref 3.5–5.1)
Sodium: 135 mEq/L (ref 135–145)

## 2013-02-22 LAB — HEPATIC FUNCTION PANEL
ALT: 13 U/L (ref 0–35)
AST: 20 U/L (ref 0–37)
Albumin: 2.9 g/dL — ABNORMAL LOW (ref 3.5–5.2)
Alkaline Phosphatase: 80 U/L (ref 39–117)
Bilirubin, Direct: 0.2 mg/dL (ref 0.0–0.3)
Indirect Bilirubin: 0.3 mg/dL (ref 0.3–0.9)
Total Bilirubin: 0.5 mg/dL (ref 0.3–1.2)
Total Protein: 7.8 g/dL (ref 6.0–8.3)

## 2013-02-22 LAB — PROTIME-INR
INR: 1.54 — ABNORMAL HIGH (ref 0.00–1.49)
Prothrombin Time: 18 seconds — ABNORMAL HIGH (ref 11.6–15.2)

## 2013-02-22 LAB — SEDIMENTATION RATE: Sed Rate: 76 mm/hr — ABNORMAL HIGH (ref 0–22)

## 2013-02-22 LAB — TROPONIN I
Troponin I: <0.3 ng/mL (ref <0.30)
Troponin I: <0.3 ng/mL (ref <0.30)
Troponin I: 4.46 ng/mL (ref <0.30)

## 2013-02-22 LAB — HEPARIN LEVEL (UNFRACTIONATED): Heparin Unfractionated: <0.1 IU/mL — ABNORMAL LOW (ref 0.30–0.70)

## 2013-02-22 LAB — POCT I-STAT TROPONIN I: Troponin i, poc: 0.02 ng/mL (ref 0.00–0.08)

## 2013-02-22 LAB — GLUCOSE, CAPILLARY: Glucose-Capillary: 92 mg/dL (ref 70–99)

## 2013-02-22 LAB — TSH: TSH: 0.848 u[IU]/mL (ref 0.350–4.500)

## 2013-02-22 LAB — LIPASE, BLOOD: Lipase: 13 U/L (ref 11–59)

## 2013-02-22 LAB — RHEUMATOID FACTOR: Rhuematoid fact SerPl-aCnc: 245 IU/mL — ABNORMAL HIGH (ref <=14)

## 2013-02-22 MED ORDER — METOPROLOL TARTRATE 25 MG PO TABS
25.0000 mg | ORAL_TABLET | Freq: Two times a day (BID) | ORAL | Status: DC
  Administered 2013-02-22 – 2013-02-25 (×6): 25 mg via ORAL
  Filled 2013-02-22 (×7): qty 1

## 2013-02-22 MED ORDER — HEPARIN BOLUS VIA INFUSION
4000.0000 [IU] | Freq: Once | INTRAVENOUS | Status: AC
  Administered 2013-02-22 (×2): 4000 [IU] via INTRAVENOUS

## 2013-02-22 MED ORDER — SODIUM CHLORIDE 0.9 % IV SOLN
250.0000 mL | INTRAVENOUS | Status: DC | PRN
  Administered 2013-02-22: 250 mL via INTRAVENOUS

## 2013-02-22 MED ORDER — HYDROMORPHONE HCL PF 1 MG/ML IJ SOLN
1.0000 mg | Freq: Once | INTRAMUSCULAR | Status: AC
  Administered 2013-02-22: 0.5 mg via INTRAVENOUS
  Filled 2013-02-22: qty 1

## 2013-02-22 MED ORDER — HEPARIN BOLUS VIA INFUSION
1950.0000 [IU] | Freq: Once | INTRAVENOUS | Status: AC
  Administered 2013-02-22: 1950 [IU] via INTRAVENOUS
  Filled 2013-02-22: qty 1950

## 2013-02-22 MED ORDER — PANTOPRAZOLE SODIUM 40 MG IV SOLR
40.0000 mg | Freq: Once | INTRAVENOUS | Status: AC
  Administered 2013-02-22: 40 mg via INTRAVENOUS
  Filled 2013-02-22: qty 40

## 2013-02-22 MED ORDER — DILTIAZEM HCL 100 MG IV SOLR
5.0000 mg/h | INTRAVENOUS | Status: DC
  Administered 2013-02-22 (×2): 5 mg/h via INTRAVENOUS
  Filled 2013-02-22: qty 100

## 2013-02-22 MED ORDER — IBUPROFEN 800 MG PO TABS
800.0000 mg | ORAL_TABLET | Freq: Four times a day (QID) | ORAL | Status: DC | PRN
  Administered 2013-02-22: 800 mg via ORAL
  Filled 2013-02-22: qty 1

## 2013-02-22 MED ORDER — HEPARIN (PORCINE) IN NACL 100-0.45 UNIT/ML-% IJ SOLN
1550.0000 [IU]/h | INTRAMUSCULAR | Status: DC
  Administered 2013-02-22: 1200 [IU]/h via INTRAVENOUS
  Administered 2013-02-22: 950 [IU]/h via INTRAVENOUS
  Administered 2013-02-23 – 2013-02-24 (×3): 1400 [IU]/h via INTRAVENOUS
  Filled 2013-02-22 (×7): qty 250

## 2013-02-22 MED ORDER — ONDANSETRON HCL 4 MG/2ML IJ SOLN: 4.0000 mg | Freq: Four times a day (QID) | INTRAMUSCULAR | Status: DC | PRN

## 2013-02-22 MED ORDER — ASPIRIN 81 MG PO CHEW
324.0000 mg | CHEWABLE_TABLET | ORAL | Status: AC
  Administered 2013-02-22: 324 mg via ORAL
  Filled 2013-02-22: qty 4

## 2013-02-22 MED ORDER — HYDROCODONE-ACETAMINOPHEN 5-325 MG PO TABS
1.0000 | ORAL_TABLET | Freq: Four times a day (QID) | ORAL | Status: DC
  Administered 2013-02-22 – 2013-02-25 (×13): 1 via ORAL
  Filled 2013-02-22 (×13): qty 1

## 2013-02-22 MED ORDER — SODIUM CHLORIDE 0.9 % IJ SOLN
3.0000 mL | Freq: Two times a day (BID) | INTRAMUSCULAR | Status: DC
  Administered 2013-02-22 – 2013-02-25 (×2): 3 mL via INTRAVENOUS

## 2013-02-22 MED ORDER — ASPIRIN 300 MG RE SUPP: 300.0000 mg | RECTAL | Status: AC

## 2013-02-22 MED ORDER — ASPIRIN EC 81 MG PO TBEC
81.0000 mg | DELAYED_RELEASE_TABLET | Freq: Every day | ORAL | Status: DC
  Administered 2013-02-23 – 2013-02-25 (×2): 81 mg via ORAL
  Filled 2013-02-22 (×3): qty 1

## 2013-02-22 MED ORDER — DILTIAZEM LOAD VIA INFUSION
10.0000 mg | Freq: Once | INTRAVENOUS | Status: AC
  Administered 2013-02-22 (×2): 10 mg via INTRAVENOUS
  Filled 2013-02-22: qty 10

## 2013-02-22 MED ORDER — ONDANSETRON HCL 4 MG/2ML IJ SOLN
4.0000 mg | Freq: Once | INTRAMUSCULAR | Status: AC
  Administered 2013-02-22: 4 mg via INTRAVENOUS
  Filled 2013-02-22: qty 2

## 2013-02-22 MED ORDER — NITROGLYCERIN 0.4 MG SL SUBL: 0.4000 mg | SUBLINGUAL_TABLET | SUBLINGUAL | Status: DC | PRN

## 2013-02-22 MED ORDER — SODIUM CHLORIDE 0.9 % IJ SOLN: 3.0000 mL | INTRAMUSCULAR | Status: DC | PRN

## 2013-02-22 MED ORDER — ACETAMINOPHEN 325 MG PO TABS
650.0000 mg | ORAL_TABLET | ORAL | Status: DC | PRN
  Filled 2013-02-22: qty 2

## 2013-02-22 NOTE — H&P (Signed)
[facility]  History and physical  Reason for Consult:  Atrial fibrillation rapid ventricular response  Chest pain  Cardiologist:  Lynnea Ferrier  HPI:  This is a 52 y.o. 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 She presents today with complaints of chest discomfort and is found to be in atrial fibrillation rapid ventricular response.  Her chest pain began approximately 48 hours ago and radiates through to her back. It is not associated with exertion. It is present currently but has improved after opiate analgesics. The pain is positional and worsens when she bends over or turns onto her right or left side. The chest wall is nontender. Discomfort worsens slightly with deep inspiration but not coughing. Yesterday she felt some minor chills but has not had fever, coughing, hemoptysis, wheezing, calf swelling or tenderness. She also denies orthopnea paroxysmal nocturnal dyspnea, recent focal neurological deficits, lower extremity edema or intermittent claudication.  She was last seen in our practice over 3 years ago by Dr. Lynnea Ferrier. I cannot retrieve those records at this time. It sounds like she has had atrial fibrillation before. She underwent an echocardiogram and what sounds at the nuclear stress test which were "good".  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 She responded well to treatment with steroids which have subsequently been weaned off and she has been in remission without therapy for over a year. At one point she was seeing a rheumatologist, I think Dr.Deveshwar, as well as a pulmonologist (the name of whom she cannot recall).  PMHx:  Past Medical History   Diagnosis   Date   .  Red cell aplasia  09/14/2011   .  Thrombocytosis  09/14/2011   .  Lupus  09/14/2011   .  Hypertension    .  Diverticulitis    .  Atrial fibrillation     History reviewed. No pertinent past surgical history.  FAMHx:  No family history on file.  SOCHx:  reports that she has never smoked. She does not have any smokeless tobacco history on file. She reports that she does not drink alcohol or use illicit drugs.  ALLERGIES:  No Known Allergies  ROS:  Constitutional: positive for chills, negative for fatigue, fevers, night sweats and weight loss  Eyes: negative for Dry eyes  Ears, nose, mouth, throat, and face: positive for hoarseness, negative for earaches, epistaxis, hearing loss, nasal congestion, sore throat, tinnitus and Dry mouth  Respiratory: positive for dyspnea on exertion  Cardiovascular: positive for chest pain, negative for claudication, exertional chest pressure/discomfort, lower extremity edema, near-syncope, orthopnea, palpitations, paroxysmal nocturnal dyspnea and syncope  Gastrointestinal: negative for change in bowel habits, dysphagia, nausea, reflux symptoms and vomiting  Genitourinary:negative  Integument/breast: negative  Hematologic/lymphatic: negative for bleeding, easy bruising and petechiae  Musculoskeletal:positive for Hand joint deformity, negative for recent worsening of stiffness or range of motion limitations  Neurological: negative for dizziness, gait problems, headaches, seizures, tremors, vertigo and weakness  Behavioral/Psych: negative  Endocrine: negative for diabetic symptoms including polydipsia, polyphagia, polyuria and weight loss  Allergic/Immunologic: positive for Poorly defined collagen vascular disease no recent exacerbation  HOME MEDICATIONS:   (Not in a hospital admission)  HOSPITAL MEDICATIONS:  Prior to Admission:  (Not in  a hospital admission)  VITALS:  Blood pressure 105/74, pulse 91, temperature 98.2 F (36.8 C), temperature  source Oral, resp. rate 20, last menstrual period 12/23/2012, SpO2 98.00%.  PHYSICAL EXAM:  General appearance: alert, cooperative, no distress and Very thin  Neck: no adenopathy, no carotid bruit, no JVD, supple, symmetrical, trachea midline and thyroid not enlarged, symmetric, no tenderness/mass/nodules  Lungs: Dry crackles are heard in the bases especially prominent on the left, no rales or wheezes no signs of consolidation by percussion palpation auscultation  Heart: irregularly irregular rhythm, S1, S2 normal, mid systolic click present, no rub and No murmurs are heard  Abdomen: soft, non-tender; bowel sounds normal; no masses, no organomegaly  Extremities: extremities normal, atraumatic, no cyanosis or edema, Homans sign is negative, no sign of DVT and Prominent deformities of the metacarpophalangeal joints bilaterally, ulnar deviation noted bilaterally, slightly tightened skin over the distal phalanx ease of both hands suggestive of sclerodactyly  Pulses: 2+ and symmetric  Skin: Skin color, texture, turgor normal. No rashes or lesions  Neurologic: Alert and oriented X 3, normal strength and tone. Normal symmetric reflexes. Normal coordination and gait  LABS:  Results for orders placed during the hospital encounter of 02/22/13 (from the past 48 hour(s))   BASIC METABOLIC PANEL Status: Abnormal    Collection Time    02/22/13 7:30 AM   Result  Value  Range    Sodium  135  135 - 145 mEq/L    Potassium  3.4 (*)  3.5 - 5.1 mEq/L    Chloride  101  96 - 112 mEq/L    CO2  20  19 - 32 mEq/L    Glucose, Bld  103 (*)  70 - 99 mg/dL    BUN  10  6 - 23 mg/dL    Creatinine, Ser  5.62  0.50 - 1.10 mg/dL    Calcium  8.8  8.4 - 10.5 mg/dL    GFR calc non Af Amer  >90  >90 mL/min    GFR calc Af Amer  >90  >90 mL/min    Comment:      The eGFR has been calculated     using the CKD EPI equation.     This calculation has not been     validated in all clinical     situations.     eGFR's persistently      <90 mL/min signify     possible Chronic Kidney Disease.   CBC Status: None    Collection Time    02/22/13 7:30 AM   Result  Value  Range    WBC  10.2  4.0 - 10.5 K/uL    RBC  3.94  3.87 - 5.11 MIL/uL    Hemoglobin  13.0  12.0 - 15.0 g/dL    HCT  13.0  86.5 - 78.4 %    MCV  95.9  78.0 - 100.0 fL    MCH  33.0  26.0 - 34.0 pg    MCHC  34.4  30.0 - 36.0 g/dL    RDW  69.6  29.5 - 28.4 %    Platelets  368  150 - 400 K/uL   HEPATIC FUNCTION PANEL Status: Abnormal    Collection Time    02/22/13 7:30 AM   Result  Value  Range    Total Protein  7.8  6.0 - 8.3 g/dL    Albumin  2.9 (*)  3.5 - 5.2 g/dL    AST  20  0 - 37 U/L    ALT  13  0 - 35 U/L    Alkaline Phosphatase  80  39 - 117 U/L    Total Bilirubin  0.5  0.3 - 1.2 mg/dL    Bilirubin, Direct  0.2  0.0 - 0.3 mg/dL    Indirect Bilirubin  0.3  0.3 - 0.9 mg/dL   LIPASE, BLOOD Status: None    Collection Time    02/22/13 7:30 AM   Result  Value  Range    Lipase  13  11 - 59 U/L   POCT I-STAT TROPONIN I Status: None    Collection Time    02/22/13 7:39 AM   Result  Value  Range    Troponin i, poc  0.02  0.00 - 0.08 ng/mL    Comment 3      Comment:  Due to the release kinetics of cTnI,     a negative result within the first hours     of the onset of symptoms does not rule out     myocardial infarction with certainty.     If myocardial infarction is still suspected,     repeat the test at appropriate intervals.   GLUCOSE, CAPILLARY Status: None    Collection Time    02/22/13 8:23 AM   Result  Value  Range    Glucose-Capillary  92  70 - 99 mg/dL    IMAGING:  Dg Chest 2 View  02/22/2013 *RADIOLOGY REPORT* Clinical Data: Substernal chest pain CHEST - 2 VIEW Comparison: CT chest 09/03/2009 and chest radiograph 07/17/2007 Findings: Stable mild cardiomegaly. Mediastinal hilar contours are normal in stable. Bibasilar pulmonary fibrosis appears without significant change since imaging studies of 2008 and 2010. The lung volumes are  slightly low bilaterally. Pulmonary vascularity is normal. No airspace disease or pleural effusion. Negative for pneumothorax. The no acute or suspicious bony abnormality is identified. Visualized bowel gas pattern is nonobstructive. 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 No acute superimposed abnormality is identified. Original Report Authenticated By: Britta Mccreedy, M.D.   IMPRESSION:  Active Problems:  Atrial fibrillation with RVR  Pleuritic chest pain  Lupus  Multifocal atrial tachycardia  Pulmonary fibrosis, postinflammatory   1. Atrial fibrillation: in the emergency room she has lengthy periods of what appears to be atrial fibrillation rapid ventricular response but also episodes of multifocal atrial tachycardia both with rates around 130 beats per minute. Intermittently she returns to normal sinus rhythm with a uniform P wave morphology at a rate of about 70-80 beats per minute with frequent premature atrial contractions. This pattern suggests underlying lung disease may be a cause for the current arrhythmia exacerbation, by chest x-ray and physical exam the does not appear to be evidence of pneumonia or other acute worsening of her chronic pulmonary fibrosis. The arrhythmia may be secondary to acute pericarditis. 2. Her chest pain is most consistent with a pleuritic pattern and the suspicion may have acute pericarditis. There is a suggestion of ST segment elevation seen in multiple leads, such as V5, V6, I, II and aVF. The unusual distribution of ST segment elevation in the upwardly concave pattern of the ST segment is more keeping with pericarditis, rather than acute ST segment elevation myocardial infarction. 3. Her hemoglobin is normal as is her platelet count One unifying diagnosis would be acute pericarditis related to collagen vascular disease with secondary arrhythmia  RECOMMENDATION:  1. Intravenous diltiazem, with gradual transition to oral beta  blockers. 2.  Intravenous heparin while her workup was continued; long term , according to her low CHADS2Vasc score, aspirin alone should be sufficient for prophylaxis of stroke 3. Echocardiogram 4. Nonsteroidal anti-inflammatory drugs or steroids if pericarditis is confirmed; opiate analgesics while on heparin 5. Screening tests for collagen vascular disease exacerbation 6. Arrange reevaluation by rheumatologist at the time of discharge; may also need followup with a pulmonologist Time Spent Directly with Patient:  60 minutes  Thurmon Fair, MD, [facility]  [phone] office  [phone] pager  02/22/2013, 8:56 AM

## 2013-02-22 NOTE — ED Provider Notes (Signed)
  Medical screening examination/treatment/procedure(s) were performed by non-physician practitioner and as supervising physician I was immediately available for consultation/collaboration.  On my exam the patient was in no distress.  Her heart rate improved substantially following initiation of diltiazem therapy.    I saw the ECG (if appropriate), relevant labs and studies - I agree with the interpretation.    Gerhard Munch, MD 02/22/13 [phone]

## 2013-02-22 NOTE — Progress Notes (Signed)
Troponin level resulted 4.46 MD paged.

## 2013-02-22 NOTE — ED Notes (Signed)
Patient transported to X-ray 

## 2013-02-22 NOTE — ED Notes (Signed)
The pt is c/o mid-chest pain and some back pain since Friday .  It feels like indigestion.  No previous history

## 2013-02-22 NOTE — Progress Notes (Signed)
ANTICOAGULATION CONSULT NOTE - Initial Consult  Pharmacy Consult for heparin Indication: atrial fibrillation  No Known Allergies  Patient Measurements:   Heparin Dosing Weight: 65kg  Vital Signs: Temp: 98.2 F (36.8 C) (05/18 0607) Temp src: Oral (05/18 0607) BP: 105/74 mmHg (05/18 0607) Pulse Rate: 91 (05/18 0607)  Labs:  Recent Labs  02/22/13 0730  HGB 13.0  HCT 37.8  PLT 368  CREATININE 0.62    The CrCl is unknown because both a height and weight (above a minimum accepted value) are required for this calculation.   Medical History: Past Medical History  Diagnosis Date  . 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 Lupus 09/14/2011  . Hypertension   . Diverticulitis   . Atrial fibrillation     Medications:   (Not in a hospital admission) Scheduled:  . aspirin  324 mg Oral NOW   Or  . aspirin  300 mg Rectal NOW  . [START ON 02/23/2013] aspirin EC  81 mg Oral Daily  . HYDROcodone-acetaminophen  1 tablet Oral Q6H  . metoprolol tartrate  25 mg Oral BID  . sodium chloride  3 mL Intravenous Q12H    Assessment: 52 yo who presented with new onset afib. IV heparin will be started for anticoagulation. She was on on any anticoagulant prior to admission.   Goal of Therapy:  Heparin level 0.3-0.7 units/ml Monitor platelets by anticoagulation protocol: Yes   Plan:  Heparin bolus 4000 units x1 Heparin drip at 950 units/hr F/u with 6 hr heparin level  Ulyses Southward Cassandra 02/22/2013,9:08 AM

## 2013-02-22 NOTE — ED Notes (Signed)
lmp april

## 2013-02-22 NOTE — ED Notes (Signed)
Spoke with Dr. Royann Shivers (Cardiologist) about pt c/o 3/10 Chest pain.  No new orders given states that he is ok with pt going to [facility].  Verified and read back.

## 2013-02-22 NOTE — ED Provider Notes (Signed)
History     CSN: [id number]  Arrival date & time 02/22/13  0601   First MD Initiated Contact with Patient 02/22/13 [phone]      Chief Complaint  Patient presents with  . Chest Pain    (Consider location/radiation/quality/duration/timing/severity/associated sxs/prior treatment) HPI  52 year old female with a past medical history of lupus, pulmonary fibrosis, red cell a place, and thrombocytosis who is followed by Dr. Myna Hidalgo in hematology oncology and [facility] presents to the emergency department with chief complaint of chest pain.  Patient states she has had approximately 3 days of constant, burning, substernal chest pain.  She describes the pain as throbbing and radiating to her left shoulder.  She states it has been worsening in intensity and severity.  She also has associated nausea and dyspnea at rest.  She denies vomiting or diaphoresis.  She denies pressure on her chest.  Patient also has burning epigastric pain which worsened with her bowel movements today. The patient denies history of diabetes, hypertension, smoking history.  She denies of past family history of stroke or heart attack.  Denies fevers, chills, myalgias, arthralgias.  Denies dysuria, flank pain, suprapubic pain, frequency, urgency, or hematuria. Denies headaches, light headedness, weakness, visual disturbances. Denies  vomiting, diarrhea or constipation.     Past Medical History  Diagnosis Date  . 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 Lupus 09/14/2011  . Hypertension   . Diverticulitis     History reviewed. No pertinent past surgical history.  No family history on file.  History  Substance Use Topics  . Smoking status: Never Smoker   . Smokeless tobacco: Not on file  . Alcohol Use: No    OB History   Grav Para Term Preterm Abortions TAB SAB Ect Mult Living                  Review of Systems Ten systems reviewed and are negative for acute change, except as noted in the HPI.     Allergies  Review of patient's allergies indicates no known allergies.  Home Medications   Current Outpatient Rx  Name  Route  Sig  Dispense  Refill  . folic acid (FOLVITE) 1 MG tablet   Oral   Take 1 mg by mouth daily.         Marland Kitchen ibuprofen (ADVIL,MOTRIN) 200 MG tablet   Oral   Take 400 mg by mouth every 6 (six) hours as needed for pain. For pain         . Multiple Vitamin (MULTIVITAMIN WITH MINERALS) TABS   Oral   Take 1 tablet by mouth daily.         . Vitamin D, Ergocalciferol, (DRISDOL) 50000 UNITS CAPS   Oral   Take 50,000 Units by mouth 2 (two) times a week. tueday and friday           BP 105/74  Pulse 91  Temp(Src) 98.2 F (36.8 C) (Oral)  Resp 20  SpO2 98%  LMP 12/23/2012  Physical Exam Physical Exam  Nursing note and vitals reviewed. Constitutional: She is oriented to person, place, and time. Thin, uncomfortable appearing female HENT:  Head: Normocephalic and atraumatic.  Eyes: Conjunctivae normal and EOM are normal. Pupils are equal, round, and reactive to light. No scleral icterus.  Neck: Normal range of motion. supple, no adenopathy. Cardiovascular: Irregularly, irregular rhythm. No abnormal heart sounds auscultated Pulmonary/Chest: Effort normal and breath sounds normal. No respiratory distress.  Abdominal: Soft. Bowel sounds are normal.  She exhibits no distension and no mass. There is no tenderness. There is no guarding.  Neurological: She is alert and oriented to person, place, and time.  Skin: Skin is warm and dry. She is not diaphoretic.    ED Course  Procedures (including critical care time)  Labs Reviewed  CBC  BASIC METABOLIC PANEL  HEPATIC FUNCTION PANEL  LIPASE, BLOOD  POCT I-STAT TROPONIN I   No results found.   1. Atrial fibrillation with RVR      Date: 02/22/2013  Rate: 129  Rhythm: atrial fibrillation  QRS Axis: normal  Intervals: normal  ST/T Wave abnormalities: normal  Conduction Disutrbances:none  Narrative  Interpretation:   Old EKG Reviewed: changes noted    MDM  7:44 AM Filed Vitals:   02/22/13 0607  BP: 105/74  Pulse: 91  Temp: 98.2 F (36.8 C)  Resp: 20   Patient appears to be in atrial fibrillation.  EKG here at the emergency department confirms.  I spoke with the patient he states she had a "valvular problem" and has been previously seen by [facility].  Patient states she also had used to have a problem with her rhythm but is unsure what it was and states she has not had a problem with it in many years. I am unable to find any correlating history in my review of her past chart. Patient was last seen here at the emergency department for complaint of dizziness, EKG at that time was normal.    9:17 AM BP 105/74  Pulse 91  Temp(Src) 98.2 F (36.8 C) (Oral)  Resp 20  Ht 5\' 11"  (1.803 m)  Wt 142 lb (64.411 kg)  BMI 19.81 kg/m2  SpO2 98%  LMP 12/23/2012 Patient started on  Diltiazem will be admitted for Afib with RVR by Dr. Royann Shivers. HR now in the 90s The patient appears reasonably stabilized for admission considering the current resources, flow, and capabilities available in the ED at this time, and I doubt any other [facility] requiring further screening and/or treatment in the ED prior to admission.       Arthor Captain, PA-C 02/22/13 [phone]

## 2013-02-22 NOTE — Progress Notes (Signed)
Patient ID: Carolyn Proctor, female   DOB: 1961/09/27, 52 y.o.   MRN: [id number]      [facility]       History and physical   Reason for Consult: Atrial fibrillation rapid ventricular response  Chest pain  Cardiologist: Lynnea Ferrier  HPI: This is a 52 y.o. 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 She presents today with complaints of chest discomfort and is found to be in atrial fibrillation rapid ventricular response.  Her chest pain began approximately 48 hours ago and radiates through to her back. It is not associated with exertion. It is present currently but has improved after opiate analgesics. The pain is positional and worsens when she bends over or turns onto her right or left side. The chest wall is nontender. Discomfort worsens slightly with deep inspiration but not coughing. Yesterday she felt some minor chills but has not had fever, coughing, hemoptysis, wheezing, calf swelling or tenderness. She also denies orthopnea paroxysmal nocturnal dyspnea, recent focal neurological deficits, lower extremity edema or intermittent claudication.  She was last seen in our practice over 3 years ago by Dr. Lynnea Ferrier. I cannot retrieve those records at this time. It sounds like she has had atrial fibrillation before. She underwent an echocardiogram and what sounds at the nuclear stress test which were "good".  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 She responded well to treatment with steroids which have subsequently been weaned off and she has been in remission without therapy for over a year. At one point she was seeing a rheumatologist, I think Dr.Deveshwar, as well as a  pulmonologist (the name of whom she cannot recall).   PMHx:  Past Medical History  Diagnosis Date  . 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 Lupus 09/14/2011  . Hypertension   . Diverticulitis   . Atrial fibrillation    History reviewed. No pertinent past surgical history.  FAMHx: No family history on file.  SOCHx:  reports that she has never smoked. She does not have any smokeless tobacco history on file. She reports that she does not drink alcohol or use illicit drugs.  ALLERGIES: No Known Allergies  ROS: Constitutional: positive for chills, negative for fatigue, fevers, night sweats and weight loss Eyes: negative for Dry eyes Ears, nose, mouth, throat, and face: positive for hoarseness, negative for earaches, epistaxis, hearing loss, nasal congestion, sore throat, tinnitus and Dry mouth Respiratory: positive for dyspnea on exertion Cardiovascular: positive for chest pain, negative for claudication, exertional chest pressure/discomfort, lower extremity edema, near-syncope, orthopnea, palpitations, paroxysmal nocturnal dyspnea and syncope Gastrointestinal: negative for change in bowel habits, dysphagia, nausea, reflux symptoms and vomiting Genitourinary:negative Integument/breast: negative Hematologic/lymphatic: negative for bleeding, easy bruising and petechiae Musculoskeletal:positive for Hand joint deformity, negative for recent worsening of stiffness or range of motion limitations Neurological: negative for dizziness, gait problems, headaches, seizures, tremors, vertigo and weakness Behavioral/Psych: negative Endocrine: negative for diabetic symptoms including polydipsia, polyphagia, polyuria and weight loss Allergic/Immunologic: positive for Poorly defined collagen vascular disease no recent exacerbation  HOME MEDICATIONS:  (Not in a hospital admission)  HOSPITAL MEDICATIONS: Prior to Admission:  (Not in a hospital admission)  VITALS: Blood  pressure 105/74, pulse 91,  temperature 98.2 F (36.8 C), temperature source Oral, resp. rate 20, last menstrual period 12/23/2012, SpO2 98.00%.  PHYSICAL EXAM: General appearance: alert, cooperative, no distress and Very thin Neck: no adenopathy, no carotid bruit, no JVD, supple, symmetrical, trachea midline and thyroid not enlarged, symmetric, no tenderness/mass/nodules Lungs: Dry crackles are heard in the bases especially prominent on the left, no rales or wheezes no signs of consolidation by percussion palpation auscultation Heart: irregularly irregular rhythm, S1, S2 normal, mid systolic click present, no rub and No murmurs are heard Abdomen: soft, non-tender; bowel sounds normal; no masses,  no organomegaly Extremities: extremities normal, atraumatic, no cyanosis or edema, Homans sign is negative, no sign of DVT and Prominent deformities of the metacarpophalangeal joints bilaterally, ulnar deviation noted bilaterally, slightly tightened skin over the distal phalanx ease of both hands suggestive of sclerodactyly Pulses: 2+ and symmetric Skin: Skin color, texture, turgor normal. No rashes or lesions Neurologic: Alert and oriented X 3, normal strength and tone. Normal symmetric reflexes. Normal coordination and gait  LABS: Results for orders placed during the hospital encounter of 02/22/13 (from the past 48 hour(s))  BASIC METABOLIC PANEL     Status: Abnormal   Collection Time    02/22/13  7:30 AM      Result Value Range   Sodium 135  135 - 145 mEq/L   Potassium 3.4 (*) 3.5 - 5.1 mEq/L   Chloride 101  96 - 112 mEq/L   CO2 20  19 - 32 mEq/L   Glucose, Bld 103 (*) 70 - 99 mg/dL   BUN 10  6 - 23 mg/dL   Creatinine, Ser 2.95  0.50 - 1.10 mg/dL   Calcium 8.8  8.4 - 62.1 mg/dL   GFR calc non Af Amer >90  >90 mL/min   GFR calc Af Amer >90  >90 mL/min   Comment:            The eGFR has been calculated     using the CKD EPI equation.     This calculation has not been     validated in all  clinical     situations.     eGFR's persistently     <90 mL/min signify     possible Chronic Kidney Disease.  CBC     Status: None   Collection Time    02/22/13  7:30 AM      Result Value Range   WBC 10.2  4.0 - 10.5 K/uL   RBC 3.94  3.87 - 5.11 MIL/uL   Hemoglobin 13.0  12.0 - 15.0 g/dL   HCT 30.8  65.7 - 84.6 %   MCV 95.9  78.0 - 100.0 fL   MCH 33.0  26.0 - 34.0 pg   MCHC 34.4  30.0 - 36.0 g/dL   RDW 96.2  95.2 - 84.1 %   Platelets 368  150 - 400 K/uL  HEPATIC FUNCTION PANEL     Status: Abnormal   Collection Time    02/22/13  7:30 AM      Result Value Range   Total Protein 7.8  6.0 - 8.3 g/dL   Albumin 2.9 (*) 3.5 - 5.2 g/dL   AST 20  0 - 37 U/L   ALT 13  0 - 35 U/L   Alkaline Phosphatase 80  39 - 117 U/L   Total Bilirubin 0.5  0.3 - 1.2 mg/dL   Bilirubin, Direct 0.2  0.0 - 0.3 mg/dL   Indirect Bilirubin 0.3  0.3 -  0.9 mg/dL  LIPASE, BLOOD     Status: None   Collection Time    02/22/13  7:30 AM      Result Value Range   Lipase 13  11 - 59 U/L  POCT I-STAT TROPONIN I     Status: None   Collection Time    02/22/13  7:39 AM      Result Value Range   Troponin i, poc 0.02  0.00 - 0.08 ng/mL   Comment 3            Comment: Due to the release kinetics of cTnI,     a negative result within the first hours     of the onset of symptoms does not rule out     myocardial infarction with certainty.     If myocardial infarction is still suspected,     repeat the test at appropriate intervals.  GLUCOSE, CAPILLARY     Status: None   Collection Time    02/22/13  8:23 AM      Result Value Range   Glucose-Capillary 92  70 - 99 mg/dL    IMAGING: Dg Chest 2 View  02/22/2013   *RADIOLOGY REPORT*  Clinical Data: Substernal chest pain  CHEST - 2 VIEW  Comparison: CT chest 09/03/2009 and chest radiograph 07/17/2007  Findings: Stable mild cardiomegaly.  Mediastinal hilar contours are normal in stable. Bibasilar pulmonary fibrosis appears without significant change since imaging studies  of 2008 and 2010.  The lung volumes are slightly low bilaterally.  Pulmonary vascularity is normal.  No airspace disease or pleural effusion.  Negative for pneumothorax.  The no acute or suspicious bony abnormality is identified.  Visualized bowel gas pattern is nonobstructive.  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 No acute superimposed abnormality is identified.   Original Report Authenticated By: Britta Mccreedy, M.D.    IMPRESSION: Active Problems:   Atrial fibrillation with RVR   Pleuritic chest pain   Lupus   Multifocal atrial tachycardia   Pulmonary fibrosis, postinflammatory   1. Atrial fibrillation:  in the emergency room she has lengthy periods of what appears to be atrial fibrillation rapid ventricular response but also episodes of multifocal atrial tachycardia both with rates around 130 beats per minute. Intermittently she returns to normal sinus rhythm with a uniform P wave morphology at a rate of about 70-80 beats per minute with frequent premature atrial contractions. This pattern suggests underlying lung disease may be a cause for the current arrhythmia exacerbation, by chest x-ray and physical exam the does not appear to be evidence of pneumonia or other acute worsening of her chronic pulmonary fibrosis. The arrhythmia may be secondary to acute pericarditis. 2. Her chest pain is most consistent with a pleuritic pattern and the suspicion may have acute pericarditis. There is a suggestion of ST segment elevation seen in multiple leads, such as V5, V6, I, II and aVF. The unusual distribution of ST segment elevation in the upwardly concave pattern of the ST segment is more keeping with pericarditis, rather than acute ST segment elevation myocardial infarction. 3. Her hemoglobin is normal as is her platelet count  One unifying diagnosis would be acute pericarditis related to collagen vascular disease with secondary  arrhythmia  RECOMMENDATION: 1. Intravenous diltiazem, with gradual transition to oral beta blockers. 2. Intravenous heparin while her workup was continued; long term , according to her low CHADS2Vasc score, aspirin alone should be sufficient for prophylaxis of stroke 3. Echocardiogram 4. Nonsteroidal anti-inflammatory  drugs or steroids if pericarditis is confirmed; opiate analgesics while on heparin 5. Screening tests for collagen vascular disease exacerbation 6. Arrange reevaluation by rheumatologist at the time of discharge; may also need followup with a pulmonologist 7.  Time Spent Directly with Patient: 60 minutes  Thurmon Fair, MD, [facility] [phone] office [phone] pager  02/22/2013, 8:56 AM

## 2013-02-22 NOTE — Progress Notes (Signed)
ANTICOAGULATION CONSULT NOTE - Follow Up Consult  Pharmacy Consult for Heparin Indication: atrial fibrillation  No Known Allergies  Patient Measurements: Height: 5\' 11"  (180.3 cm) Weight: 142 lb (64.411 kg) IBW/kg (Calculated) : 70.8 Heparin Dosing Weight: 65 kg  Vital Signs: Temp: 98.1 F (36.7 C) (05/18 1408) Temp src: Oral (05/18 1408) BP: 126/75 mmHg (05/18 1408) Pulse Rate: 87 (05/18 1408)  Labs:  Recent Labs  02/22/13 0730 02/22/13 0907 02/22/13 0908 02/22/13 1456 02/22/13 1542  HGB 13.0  --   --   --   --   HCT 37.8  --   --   --   --   PLT 368  --   --   --   --   LABPROT  --   --  18.0*  --   --   INR  --   --  1.54*  --   --   HEPARINUNFRC  --   --   --   --  <0.10*  CREATININE 0.62  --   --   --   --   TROPONINI  --  <0.30  --  <0.30  --     Estimated Creatinine Clearance: 86.5 ml/min (by C-G formula based on Cr of 0.62).  Medications:  Infusions:  . diltiazem (CARDIZEM) infusion 5 mg/hr (02/22/13 0925)  . heparin 950 Units/hr (02/22/13 4098)   Assessment: 52 y/o female on heparin for new onset Afib. Heparin level is subtherapeutic at <0.1 on 950 units/hr. Spoke with RN and no problems infusing. No bleeding noted.  Goal of Therapy:  Heparin level 0.3-0.7 units/ml Monitor platelets by anticoagulation protocol: Yes   Plan:  -Increase heparin to 1200 units/hr and give 1950 unit IV bolus -Heparin level 6 hours after rate change -Daily heparin level and CBC while on heparin -Monitor for signs/symptoms of bleeding  [facility] Spencer.D., BCPS Clinical Pharmacist Pager: [phone] 02/22/2013 4:26 PM

## 2013-02-23 DIAGNOSIS — I519 Heart disease, unspecified: Secondary | ICD-10-CM

## 2013-02-23 LAB — TROPONIN I
Troponin I: 1.56 ng/mL (ref <0.30)
Troponin I: 1.47 ng/mL (ref <0.30)

## 2013-02-23 LAB — CBC
HCT: 35.8 % — ABNORMAL LOW (ref 36.0–46.0)
Hemoglobin: 11.8 g/dL — ABNORMAL LOW (ref 12.0–15.0)
MCH: 32 pg (ref 26.0–34.0)
MCHC: 33 g/dL (ref 30.0–36.0)
MCV: 97 fL (ref 78.0–100.0)
Platelets: 340 10*3/uL (ref 150–400)
RBC: 3.69 MIL/uL — ABNORMAL LOW (ref 3.87–5.11)
RDW: 13 % (ref 11.5–15.5)
WBC: 11 10*3/uL — ABNORMAL HIGH (ref 4.0–10.5)

## 2013-02-23 LAB — C3 COMPLEMENT: C3 Complement: 157 mg/dL (ref 90–180)

## 2013-02-23 LAB — ANA: Anti Nuclear Antibody(ANA): POSITIVE — AB

## 2013-02-23 LAB — ANTI-NUCLEAR AB-TITER (ANA TITER): ANA Titer 1: 1:40 {titer} — ABNORMAL HIGH

## 2013-02-23 LAB — HEPARIN LEVEL (UNFRACTIONATED)
Heparin Unfractionated: 0.25 IU/mL — ABNORMAL LOW (ref 0.30–0.70)
Heparin Unfractionated: 0.46 IU/mL (ref 0.30–0.70)

## 2013-02-23 MED ORDER — SODIUM CHLORIDE 0.9 % IV SOLN: 250.0000 mL | INTRAVENOUS | Status: DC | PRN

## 2013-02-23 MED ORDER — SODIUM CHLORIDE 0.9 % IV SOLN: 1.0000 mL/kg/h | INTRAVENOUS | Status: DC

## 2013-02-23 MED ORDER — SODIUM CHLORIDE 0.9 % IJ SOLN: 3.0000 mL | INTRAMUSCULAR | Status: DC | PRN

## 2013-02-23 MED ORDER — HEPARIN BOLUS VIA INFUSION
1500.0000 [IU] | Freq: Once | INTRAVENOUS | Status: AC
  Administered 2013-02-23: 1500 [IU] via INTRAVENOUS
  Filled 2013-02-23: qty 1500

## 2013-02-23 MED ORDER — ASPIRIN 81 MG PO CHEW
324.0000 mg | CHEWABLE_TABLET | ORAL | Status: AC
  Administered 2013-02-24: 324 mg via ORAL
  Filled 2013-02-23: qty 4

## 2013-02-23 MED ORDER — SODIUM CHLORIDE 0.9 % IJ SOLN: 3.0000 mL | Freq: Two times a day (BID) | INTRAMUSCULAR | Status: DC

## 2013-02-23 NOTE — Progress Notes (Signed)
ANTICOAGULATION CONSULT NOTE - Follow Up Consult  Pharmacy Consult for Heparin Indication: atrial fibrillation  No Known Allergies  Patient Measurements: Height: 5\' 11"  (180.3 cm) Weight: 142 lb (64.411 kg) IBW/kg (Calculated) : 70.8 Heparin Dosing Weight: 65 kg  Vital Signs: Temp: 99 F (37.2 C) (05/18 2100) Temp src: Oral (05/18 2100) BP: 121/71 mmHg (05/18 2100) Pulse Rate: 97 (05/18 2100)  Labs:  Recent Labs  02/22/13 0730 02/22/13 0907 02/22/13 0908 02/22/13 1456 02/22/13 1542 02/22/13 2030 02/22/13 2319  HGB 13.0  --   --   --   --   --   --   HCT 37.8  --   --   --   --   --   --   PLT 368  --   --   --   --   --   --   LABPROT  --   --  18.0*  --   --   --   --   INR  --   --  1.54*  --   --   --   --   HEPARINUNFRC  --   --   --   --  <0.10*  --  0.25*  CREATININE 0.62  --   --   --   --   --   --   TROPONINI  --  <0.30  --  <0.30  --  4.46*  --     Estimated Creatinine Clearance: 86.5 ml/min (by C-G formula based on Cr of 0.62).  Medications:  Infusions:  . diltiazem (CARDIZEM) infusion 5 mg/hr (02/23/13 0000)  . heparin 1,200 Units (02/23/13 0000)   Assessment: 52 y/o female on heparin for new onset atrial fibrillation. Heparin level (0.25) is below-goal on 1200 units/hr. No problem with line / infusion and no bleeding per RN.   Goal of Therapy:  Heparin level 0.3-0.7 units/ml Monitor platelets by anticoagulation protocol: Yes   Plan:  1. Heparin 1500 unit IV bolus x 1, then increase IV infusion to 1400 units/hr.  2. Heparin level in 6 hours.   Lorre Munroe, PharmD 02/23/2013 12:17 AM

## 2013-02-23 NOTE — Progress Notes (Addendum)
[facility]  DAILY PROGRESS NOTE   Subjective:  No further chest pain. Dizzy earlier when standing, SBP in low 90s. In and out of AF with controlled rate.  Objective:  Temp:  [97.9 F (36.6 C)-99 F (37.2 C)] 97.9 F (36.6 C) (05/19 0500) Pulse Rate:  [70-97] 70 (05/19 0500) Resp:  [18-20] 18 (05/19 0500) BP: (92-126)/(60-75) 92/60 mmHg (05/19 1000) SpO2:  [92 %-97 %] 97 % (05/19 0500) Weight change:   Intake/Output from previous day: 05/18 0701 - 05/19 0700 In: 60 [P.O.:60] Out: 1150 [Urine:1150]  Intake/Output from this shift:    Medications: Current Facility-Administered Medications  Medication Dose Route Frequency Provider Last Rate Last Dose  . 0.9 %  sodium chloride infusion  250 mL Intravenous PRN Kenlee Vogt, MD 10 mL/hr at 02/23/13 0700 250 mL at 02/23/13 0700  . acetaminophen (TYLENOL) tablet 650 mg  650 mg Oral Q4H PRN Shanieka Blea, MD      . aspirin EC tablet 81 mg  81 mg Oral Daily Willia Lampert, MD   81 mg at 02/23/13 1010  . heparin ADULT infusion 100 units/mL (25000 units/250 mL)  1,400 Units/hr Intravenous Continuous Braedyn Riggle, MD 14 mL/hr at 02/23/13 1331 1,400 Units/hr at 02/23/13 1331  . HYDROcodone-acetaminophen (NORCO/VICODIN) 5-325 MG per tablet 1 tablet  1 tablet Oral Q6H Baylon Santelli, MD   1 tablet at 02/23/13 1222  . ibuprofen (ADVIL,MOTRIN) tablet 800 mg  800 mg Oral Q6H PRN Brittainy Simmons, PA-C   800 mg at 02/22/13 1755  . metoprolol tartrate (LOPRESSOR) tablet 25 mg  25 mg Oral BID Thurmon Fair, MD   25 mg at 02/22/13 2230  . nitroGLYCERIN (NITROSTAT) SL tablet 0.4 mg  0.4 mg Sublingual Q5 Min x 3 PRN Tajah Schreiner, MD      . ondansetron (ZOFRAN) injection 4 mg  4 mg Intravenous Q6H PRN Angas Isabell, MD      . sodium chloride 0.9 % injection 3 mL  3 mL Intravenous Q12H Maddex Garlitz, MD   3 mL at 02/22/13 2233  . sodium chloride 0.9 % injection 3 mL  3 mL Intravenous PRN Thurmon Fair, MD         Physical Exam: General appearance: alert, cooperative and no distress Neck: no adenopathy, no carotid bruit, no JVD, supple, symmetrical, trachea midline and thyroid not enlarged, symmetric, no tenderness/mass/nodules Lungs: rales RLL, no pleural rub Heart: irregularly irregular rhythm, no pericardial rub or murmur Abdomen: soft, non-tender; bowel sounds normal; no masses,  no organomegaly Extremities: extremities normal, atraumatic, no cyanosis or edema Pulses: 2+ and symmetric Skin: Skin color, texture, turgor normal. No rashes or lesions. Subtle sclerodactyly Neurologic: Grossly normal  Lab Results: Results for orders placed during the hospital encounter of 02/22/13 (from the past 48 hour(s))  BASIC METABOLIC PANEL     Status: Abnormal   Collection Time    02/22/13  7:30 AM      Result Value Range   Sodium 135  135 - 145 mEq/L   Potassium 3.4 (*) 3.5 - 5.1 mEq/L   Chloride 101  96 - 112 mEq/L   CO2 20  19 - 32 mEq/L   Glucose, Bld 103 (*) 70 - 99 mg/dL   BUN 10  6 - 23 mg/dL   Creatinine, Ser 3.08  0.50 - 1.10 mg/dL   Calcium 8.8  8.4 - 65.7 mg/dL   GFR calc non Af Amer >90  >90 mL/min   GFR calc Af Amer >  90  >90 mL/min   Comment:            The eGFR has been calculated     using the CKD EPI equation.     This calculation has not been     validated in all clinical     situations.     eGFR's persistently     <90 mL/min signify     possible Chronic Kidney Disease.  CBC     Status: None   Collection Time    02/22/13  7:30 AM      Result Value Range   WBC 10.2  4.0 - 10.5 K/uL   RBC 3.94  3.87 - 5.11 MIL/uL   Hemoglobin 13.0  12.0 - 15.0 g/dL   HCT 16.1  09.6 - 04.5 %   MCV 95.9  78.0 - 100.0 fL   MCH 33.0  26.0 - 34.0 pg   MCHC 34.4  30.0 - 36.0 g/dL   RDW 40.9  81.1 - 91.4 %   Platelets 368  150 - 400 K/uL  HEPATIC FUNCTION PANEL     Status: Abnormal   Collection Time    02/22/13  7:30 AM      Result Value Range   Total Protein 7.8  6.0 - 8.3 g/dL    Albumin 2.9 (*) 3.5 - 5.2 g/dL   AST 20  0 - 37 U/L   ALT 13  0 - 35 U/L   Alkaline Phosphatase 80  39 - 117 U/L   Total Bilirubin 0.5  0.3 - 1.2 mg/dL   Bilirubin, Direct 0.2  0.0 - 0.3 mg/dL   Indirect Bilirubin 0.3  0.3 - 0.9 mg/dL  LIPASE, BLOOD     Status: None   Collection Time    02/22/13  7:30 AM      Result Value Range   Lipase 13  11 - 59 U/L  POCT I-STAT TROPONIN I     Status: None   Collection Time    02/22/13  7:39 AM      Result Value Range   Troponin i, poc 0.02  0.00 - 0.08 ng/mL   Comment 3            Comment: Due to the release kinetics of cTnI,     a negative result within the first hours     of the onset of symptoms does not rule out     myocardial infarction with certainty.     If myocardial infarction is still suspected,     repeat the test at appropriate intervals.  GLUCOSE, CAPILLARY     Status: None   Collection Time    02/22/13  8:23 AM      Result Value Range   Glucose-Capillary 92  70 - 99 mg/dL  TROPONIN I     Status: None   Collection Time    02/22/13  9:07 AM      Result Value Range   Troponin I <0.30  <0.30 ng/mL   Comment:            Due to the release kinetics of cTnI,     a negative result within the first hours     of the onset of symptoms does not rule out     myocardial infarction with certainty.     If myocardial infarction is still suspected,     repeat the test at appropriate intervals.  PROTIME-INR     Status: Abnormal   Collection  Time    02/22/13  9:08 AM      Result Value Range   Prothrombin Time 18.0 (*) 11.6 - 15.2 seconds   INR 1.54 (*) 0.00 - 1.49  TSH     Status: None   Collection Time    02/22/13  9:08 AM      Result Value Range   TSH 0.848  0.350 - 4.500 uIU/mL  ANA     Status: Abnormal   Collection Time    02/22/13  9:08 AM      Result Value Range   ANA POSITIVE (*) NEGATIVE  SEDIMENTATION RATE     Status: Abnormal   Collection Time    02/22/13  9:08 AM      Result Value Range   Sed Rate 76 (*) 0 - 22  mm/hr  RHEUMATOID FACTOR     Status: Abnormal   Collection Time    02/22/13  9:08 AM      Result Value Range   Rheumatoid Factor 245 (*) <=14 IU/mL   Comment: (NOTE)     Result repeated and verified.     Result confirmed by automatic dilution.                             Interpretive Table                        Low Positive: 15 - 41 IU/mL                        High Positive:  >= 42 IU/mL     In addition to the RF result, and clinical symptoms including joint     involvement, the 2010 ACR Classification Criteria for     scoring/diagnosing Rheumatoid Arthritis include the results of the     following tests:  CRP (65784), ESR (15010), and CCP (APCA) (69629).     www.rheumatology.org/practice/clinical/classification/ra/ra_2010.asp  TROPONIN I     Status: None   Collection Time    02/22/13  2:56 PM      Result Value Range   Troponin I <0.30  <0.30 ng/mL   Comment:            Due to the release kinetics of cTnI,     a negative result within the first hours     of the onset of symptoms does not rule out     myocardial infarction with certainty.     If myocardial infarction is still suspected,     repeat the test at appropriate intervals.  HEPARIN LEVEL (UNFRACTIONATED)     Status: Abnormal   Collection Time    02/22/13  3:42 PM      Result Value Range   Heparin Unfractionated <0.10 (*) 0.30 - 0.70 IU/mL   Comment:            IF HEPARIN RESULTS ARE BELOW     EXPECTED VALUES, AND PATIENT     DOSAGE HAS BEEN CONFIRMED,     SUGGEST FOLLOW UP TESTING     OF ANTITHROMBIN III LEVELS.  TROPONIN I     Status: Abnormal   Collection Time    02/22/13  8:30 PM      Result Value Range   Troponin I 4.46 (*) <0.30 ng/mL   Comment:            Due to the release kinetics of  cTnI,     a negative result within the first hours     of the onset of symptoms does not rule out     myocardial infarction with certainty.     If myocardial infarction is still suspected,     repeat the test at  appropriate intervals.     CRITICAL RESULT CALLED TO, READ BACK BY AND VERIFIED WITH:     HUDGSON M,RN 02/22/13 2137 WAYK  HEPARIN LEVEL (UNFRACTIONATED)     Status: Abnormal   Collection Time    02/22/13 11:19 PM      Result Value Range   Heparin Unfractionated 0.25 (*) 0.30 - 0.70 IU/mL   Comment:            IF HEPARIN RESULTS ARE BELOW     EXPECTED VALUES, AND PATIENT     DOSAGE HAS BEEN CONFIRMED,     SUGGEST FOLLOW UP TESTING     OF ANTITHROMBIN III LEVELS.  HEPARIN LEVEL (UNFRACTIONATED)     Status: None   Collection Time    02/23/13  5:30 AM      Result Value Range   Heparin Unfractionated 0.46  0.30 - 0.70 IU/mL   Comment:            IF HEPARIN RESULTS ARE BELOW     EXPECTED VALUES, AND PATIENT     DOSAGE HAS BEEN CONFIRMED,     SUGGEST FOLLOW UP TESTING     OF ANTITHROMBIN III LEVELS.  CBC     Status: Abnormal   Collection Time    02/23/13  5:30 AM      Result Value Range   WBC 11.0 (*) 4.0 - 10.5 K/uL   RBC 3.69 (*) 3.87 - 5.11 MIL/uL   Hemoglobin 11.8 (*) 12.0 - 15.0 g/dL   HCT 16.1 (*) 09.6 - 04.5 %   MCV 97.0  78.0 - 100.0 fL   MCH 32.0  26.0 - 34.0 pg   MCHC 33.0  30.0 - 36.0 g/dL   RDW 40.9  81.1 - 91.4 %   Platelets 340  150 - 400 K/uL    Imaging: Dg Chest 2 View  02/22/2013   *RADIOLOGY REPORT*  Clinical Data: Substernal chest pain  CHEST - 2 VIEW  Comparison: CT chest 09/03/2009 and chest radiograph 07/17/2007  Findings: Stable mild cardiomegaly.  Mediastinal hilar contours are normal in stable. Bibasilar pulmonary fibrosis appears without significant change since imaging studies of 2008 and 2010.  The lung volumes are slightly low bilaterally.  Pulmonary vascularity is normal.  No airspace disease or pleural effusion.  Negative for pneumothorax.  The no acute or suspicious bony abnormality is identified.  Visualized bowel gas pattern is nonobstructive.  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 No acute superimposed  abnormality is identified.   Original Report Authenticated By: Britta Mccreedy, M.D.    Assessment:  1. Active Problems: 2.   Atrial fibrillation with RVR 3.   Pleuritic chest pain 4.   Lupus 5.   Multifocal atrial tachycardia 6.   Pulmonary fibrosis, postinflammatory 7.   Plan:  1. Echo pending. If no significant valvular disease, normal LVEF and normal size left atrium will use ASA for prevention of embolism. If EF is low or LA severely dilated, use NOAC or warfarin. If valvular disease present, prefer warfarin. 2. Stop diltiazem and use oral beta blocker only. May need to add digoxin if rate control is inadequate 3. Note elevated ESR, may benefit from  steroid therapy. Will see if we can get an inpatient rheumatology consult. 4. Note elevated troponin: differential diagnosis is myopericarditis (which symptoms are more suggestive of) or NSTEMI. Echo may help delineate which one we are dealing with: if she has clear regional wall motion abnormalities, coronary angio would be best next step.. Beta blockers preferred.  Time Spent Directly with Patient:  45 minutes  Length of Stay:  LOS: 1 day    Carolyn Proctor 02/23/2013, 1:49 PM

## 2013-02-23 NOTE — Progress Notes (Signed)
ANTICOAGULATION CONSULT NOTE - Follow Up Consult  Pharmacy Consult for Heparin Indication: atrial fibrillation  No Known Allergies  Patient Measurements: Height: 5\' 11"  (180.3 cm) Weight: 142 lb (64.411 kg) IBW/kg (Calculated) : 70.8 Heparin Dosing Weight: 64 kg  Vital Signs: Temp: 97.9 F (36.6 C) (05/19 0500) Temp src: Oral (05/19 0000) BP: 92/60 mmHg (05/19 1000) Pulse Rate: 70 (05/19 0500)  Labs:  Recent Labs  02/22/13 0730 02/22/13 0907 02/22/13 0908 02/22/13 1456 02/22/13 1542 02/22/13 2030 02/22/13 2319 02/23/13 0530  HGB 13.0  --   --   --   --   --   --  11.8*  HCT 37.8  --   --   --   --   --   --  35.8*  PLT 368  --   --   --   --   --   --  340  LABPROT  --   --  18.0*  --   --   --   --   --   INR  --   --  1.54*  --   --   --   --   --   HEPARINUNFRC  --   --   --   --  <0.10*  --  0.25* 0.46  CREATININE 0.62  --   --   --   --   --   --   --   TROPONINI  --  <0.30  --  <0.30  --  4.46*  --   --     Estimated Creatinine Clearance: 86.5 ml/min (by C-G formula based on Cr of 0.62).   Medications:  Scheduled:  . aspirin EC  81 mg Oral Daily  . HYDROcodone-acetaminophen  1 tablet Oral Q6H  . metoprolol tartrate  25 mg Oral BID  . sodium chloride  3 mL Intravenous Q12H   Infusions:  . diltiazem (CARDIZEM) infusion 5 mg/hr (02/23/13 0700)  . heparin 1,400 Units/hr (02/23/13 0700)    Assessment: 52 yo F presented to ED with 48 hour hx CP found to have new afib with RVR on EKG.  Started on heparin infusion.  Pt now has therapeutic heparin level on 1400 units/hr.  CBC remains stable.   Goal of Therapy:  Heparin level 0.3-0.7 units/ml Monitor platelets by anticoagulation protocol: Yes   Plan:  Continue heparin at 1400 units/hr. Follow-up with heparin level and CBC in AM. Follow-up plans for long-term anticoagulation for afib.  [employer], Pharm.D., BCPS Clinical Pharmacist Pager [phone] 02/23/2013 10:52 AM

## 2013-02-23 NOTE — Progress Notes (Signed)
  Echocardiogram 2D Echocardiogram has been performed.  Cathie Beams 02/23/2013, 4:05 PM

## 2013-02-24 ENCOUNTER — Encounter (HOSPITAL_COMMUNITY)
Admission: EM | Disposition: A | Discharge status: Home / Self Care | Payer: Self-pay | Source: Home / Self Care | Attending: Cardiovascular Disease

## 2013-02-24 DIAGNOSIS — I309 Acute pericarditis, unspecified: Secondary | ICD-10-CM

## 2013-02-24 DIAGNOSIS — R079 Chest pain, unspecified: Secondary | ICD-10-CM

## 2013-02-24 HISTORY — DX: Acute pericarditis, unspecified: I30.9

## 2013-02-24 HISTORY — PX: LEFT HEART CATHETERIZATION WITH CORONARY ANGIOGRAM: SHX5451

## 2013-02-24 LAB — BASIC METABOLIC PANEL
BUN: 5 mg/dL — ABNORMAL LOW (ref 6–23)
CO2: 24 mEq/L (ref 19–32)
Calcium: 8.6 mg/dL (ref 8.4–10.5)
Chloride: 105 mEq/L (ref 96–112)
Creatinine, Ser: 0.73 mg/dL (ref 0.50–1.10)
GFR calc Af Amer: >90 mL/min (ref >90)
GFR calc non Af Amer: >90 mL/min (ref >90)
Glucose, Bld: 83 mg/dL (ref 70–99)
Potassium: 3.5 mEq/L (ref 3.5–5.1)
Sodium: 139 mEq/L (ref 135–145)

## 2013-02-24 LAB — CBC
HCT: 34.9 % — ABNORMAL LOW (ref 36.0–46.0)
Hemoglobin: 11.8 g/dL — ABNORMAL LOW (ref 12.0–15.0)
MCH: 32.4 pg (ref 26.0–34.0)
MCHC: 33.8 g/dL (ref 30.0–36.0)
MCV: 95.9 fL (ref 78.0–100.0)
Platelets: 366 10*3/uL (ref 150–400)
RBC: 3.64 MIL/uL — ABNORMAL LOW (ref 3.87–5.11)
RDW: 12.8 % (ref 11.5–15.5)
WBC: 6.7 10*3/uL (ref 4.0–10.5)

## 2013-02-24 LAB — PROTIME-INR
INR: 1.5 — ABNORMAL HIGH (ref 0.00–1.49)
Prothrombin Time: 17.7 seconds — ABNORMAL HIGH (ref 11.6–15.2)

## 2013-02-24 LAB — TROPONIN I: Troponin I: 7.57 ng/mL (ref <0.30)

## 2013-02-24 LAB — POCT ACTIVATED CLOTTING TIME: Activated Clotting Time: 165 seconds

## 2013-02-24 LAB — HEPARIN LEVEL (UNFRACTIONATED): Heparin Unfractionated: 0.22 IU/mL — ABNORMAL LOW (ref 0.30–0.70)

## 2013-02-24 SURGERY — LEFT HEART CATHETERIZATION WITH CORONARY ANGIOGRAM
Anesthesia: LOCAL

## 2013-02-24 MED ORDER — PREDNISONE 10 MG PO TABS
10.0000 mg | ORAL_TABLET | Freq: Every day | ORAL | Status: DC
  Administered 2013-02-25: 10 mg via ORAL
  Filled 2013-02-24 (×2): qty 1

## 2013-02-24 MED ORDER — MIDAZOLAM HCL 2 MG/2ML IJ SOLN
INTRAMUSCULAR | Status: AC
  Filled 2013-02-24: qty 2

## 2013-02-24 MED ORDER — HEPARIN SODIUM (PORCINE) 5000 UNIT/ML IJ SOLN
5000.0000 [IU] | Freq: Three times a day (TID) | INTRAMUSCULAR | Status: DC
  Administered 2013-02-24 – 2013-02-25 (×2): 5000 [IU] via SUBCUTANEOUS
  Filled 2013-02-24 (×5): qty 1

## 2013-02-24 MED ORDER — COLCHICINE 0.6 MG PO TABS
0.6000 mg | ORAL_TABLET | Freq: Every day | ORAL | Status: DC
  Administered 2013-02-24 – 2013-02-25 (×2): 0.6 mg via ORAL
  Filled 2013-02-24 (×2): qty 1

## 2013-02-24 MED ORDER — SODIUM CHLORIDE 0.9 % IJ SOLN: 3.0000 mL | INTRAMUSCULAR | Status: DC | PRN

## 2013-02-24 MED ORDER — LIDOCAINE HCL (PF) 1 % IJ SOLN
INTRAMUSCULAR | Status: AC
  Filled 2013-02-24: qty 30

## 2013-02-24 MED ORDER — SODIUM CHLORIDE 0.9 % IV SOLN: 250.0000 mL | INTRAVENOUS | Status: DC | PRN

## 2013-02-24 MED ORDER — FOLIC ACID 1 MG PO TABS
1.0000 mg | ORAL_TABLET | Freq: Every day | ORAL | Status: DC
  Administered 2013-02-24 – 2013-02-25 (×2): 1 mg via ORAL
  Filled 2013-02-24 (×2): qty 1

## 2013-02-24 MED ORDER — HEPARIN (PORCINE) IN NACL 2-0.9 UNIT/ML-% IJ SOLN
INTRAMUSCULAR | Status: AC
  Filled 2013-02-24: qty 1000

## 2013-02-24 MED ORDER — SODIUM CHLORIDE 0.9 % IJ SOLN: 3.0000 mL | Freq: Two times a day (BID) | INTRAMUSCULAR | Status: DC

## 2013-02-24 MED ORDER — FENTANYL CITRATE 0.05 MG/ML IJ SOLN
INTRAMUSCULAR | Status: AC
  Filled 2013-02-24: qty 2

## 2013-02-24 MED ORDER — SODIUM CHLORIDE 0.9 % IV SOLN
1.0000 mL/kg/h | INTRAVENOUS | Status: AC
  Administered 2013-02-24: 1 mL/kg/h via INTRAVENOUS

## 2013-02-24 NOTE — Progress Notes (Signed)
ANTICOAGULATION CONSULT NOTE Pharmacy Consult for Heparin Indication: atrial fibrillation  No Known Allergies  Patient Measurements: Height: 5\' 11"  (180.3 cm) Weight: 142 lb (64.411 kg) IBW/kg (Calculated) : 70.8 Heparin Dosing Weight: 64 kg  Vital Signs: Temp: 97.6 F (36.4 C) (05/20 0500) BP: 99/66 mmHg (05/20 0500) Pulse Rate: 91 (05/20 0500)  Labs:  Recent Labs  02/22/13 0730  02/22/13 0908  02/22/13 2319 02/23/13 0530 02/23/13 1409 02/23/13 1956 02/24/13 0158 02/24/13 0527  HGB 13.0  --   --   --   --  11.8*  --   --   --  11.8*  HCT 37.8  --   --   --   --  35.8*  --   --   --  34.9*  PLT 368  --   --   --   --  340  --   --   --  366  LABPROT  --   --  18.0*  --   --   --   --   --   --   --   INR  --   --  1.54*  --   --   --   --   --   --   --   HEPARINUNFRC  --   --   --   < > 0.25* 0.46  --   --   --  0.22*  CREATININE 0.62  --   --   --   --   --   --   --   --   --   TROPONINI  --   < >  --   < >  --   --  1.56* 1.47* 7.57*  --   < > = values in this interval not displayed.  Estimated Creatinine Clearance: 86.5 ml/min (by C-G formula based on Cr of 0.62).  Assessment: 52 yo F with Afib/ACS for heparin   Goal of Therapy:  Heparin level 0.3-0.7 units/ml Monitor platelets by anticoagulation protocol: Yes   Plan:  Increase Heparin 1550 units/hr F/U after cath  Geannie Risen, PharmD, BCPS 02/24/2013 6:24 AM

## 2013-02-24 NOTE — CV Procedure (Signed)
[facility] CARDIAC CATHETERIZATION REPORT  NAME:  Carolyn Proctor   MRN: [id number] DOB:  1961-02-25   ADMIT DATE: 02/22/2013 Procedure Date: 02/24/2013  INTERVENTIONAL CARDIOLOGIST: Marykay Lex, M.D., MS PRIMARY CARE PROVIDER: Gwynneth Aliment, MD PRIMARY CARDIOLOGIST: Thurmon Fair, M.D.  PATIENT:  Carolyn Proctor is a 52 y.o. female with a long-standing history of mixed connective tissue disorder who presented with chest heaviness and pressure in his head elevated troponins that are going up and down. She does that slight diffuse ST segment elevations that may be consistent with myopericarditis. She's continued to have some discomfort and had a reduced ejection fraction, the decision made to referred for diagnostic cardiac catheterization.  PRE-OPERATIVE DIAGNOSIS:    Questionable non-ST elevation MI versus myopericarditis.  PROCEDURES PERFORMED:   Left Heart Catheterization with Native Coronary Angiography via Right Femoral Artery Access  PROCEDURE:Consent:  Risks of procedure as well as the alternatives and risks of each were explained to the (patient/caregiver).  Consent for procedure obtained. Consent for signed by MD and patient with RN witness -- placed on chart.   PROCEDURE: The patient was brought to the 2nd Floor [place] [facility] in the fasting state and prepped and draped in the usual sterile fashion for Right groin or radial access. Sterile technique was used including antiseptics, cap, gloves, gown, hand hygiene, mask and sheet.  Skin prep: Chlorhexidine.  Time Out: Verified patient identification, verified procedure, site/side was marked, verified correct patient position, special equipment/implants available, medications/allergies/relevent history reviewed, required imaging and test results available.  Performed  Access: Right Common Femoral Artery; 5 Fr Sheath -- fluoroscopically guided modified Seldinger technique    Diagnostic:  5 French catheters advanced and exchanged over a J-wire.   Left Coronary Artery Angiography: JL4  Right Coronary Artery Angiography: JR 4  LV Hemodynamics (LV Gram): Angled pigtail catheter  5 Fr Sheat:  To be removed in PACU holding area with manual pressure hemostasis.  MEDICATIONS:  Anesthesia:  Local Lidocaine 18 ml  Sedation:  1 mg IV Versed, 25 mcg IV fentanyl ;   Premedication: n/a  Omnipaque Contrast: 70 ml  Hemodynamics:  Central Aortic / Mean Pressures: 144/80 mmHg; 109 mmHg (while in NSR)   Left Ventricular Pressures / EDP: 144/6 mmHg; 14 mmHg  Left Ventriculography:  EF: 50-55 %  Wall Motion: no regional abnormalities  Coronary Anatomy:  Left Main: Large-caliber vessel which bifurcates into the LAD and what appears to be Ramus Intermedius or Circumflex-Marginal Branch.  Angiographically normal LAD: Large-caliber vessel that reaches down around the apex. It gives rise to small first diagonal branch followed by a major second diagonal branch that bifurcates. D2 covers a large portion of the anterolateral wall. Angiographically normal.  Left Circumflex / Ramus: Large-caliber vessel it was large the LAD. This gives off a small vessel that appears to be probably the AV groove circumflex. It then courses as a high OM/Ramus Intermedius giving off 2 smaller branches and bifurcating distally. It reaches almost to the apex. Angiographically normal.   RCA: Large caliber, dominant vessel with a small RV marginal branch. The vessel is a high bifurcation before the acute margin. With what appeared to be an acute marginal branch actually courses down becomes the posterior ascending artery. The follow on RCA courses down along the Right Posterior AV Groove and gives off one major and 2 smaller caliber Right Posterior Lateral vessels. Angiographically normal  PATIENT DISPOSITION:    The patient was transferred to the  PACU holding area in a hemodynamicaly  stable, chest pain free condition.  The patient tolerated the procedure well, and there were no complications.  EBL:   < 10 ml  The patient was stable before, during, and after the procedure.  POST-OPERATIVE DIAGNOSIS:    Angiographically normal Coronary Arteries in a Right dominant system  Suspect the etiology of her elevated troponin levels & Afib is indeed Myopericarditis.  Mildy reduced LVEF ~50-55% - improved from Echocardiographic EF of 40-45% - while in NSR  Mildly elevated LVEDP of ~15 mmHG  PLAN OF CARE:  Standard post catheterization care.  D/c IV Heparin & use ASA for long term Anticoagulation for pAfib, now that EF improved.  Will consult Rheumatology, re: NSAIDS vs. Steroids -- recommendation is prednisone 10 mg daily and culture seen. She will followup with Dr. Fatima Sanger rheumatology clinic for followup.  Ambulate this PM after bed rest & anticipate 1-2 more days to establish management of myopericarditis prior to discharge.   Marykay Lex, M.D., M.S. [facility] [address] [place], [place]  [address]  [phone]  02/24/2013 11:01 AM

## 2013-02-24 NOTE — H&P (View-Only) (Signed)
Subjective:  Mild chest discomfort this AM -- improved with sitting up, but worse with bending over.  Feels like a foot on her chest with associated SOB.   Denies palpitations or SOB without chest discomfort.  Objective:  Vital Signs in the last 24 hours: Temp:  [97.6 F (36.4 C)-98.1 F (36.7 C)] 97.6 F (36.4 C) (05/20 0500) Pulse Rate:  [83-100] 91 (05/20 0500) Resp:  [18-20] 18 (05/20 0500) BP: (80-129)/(40-79) 99/66 mmHg (05/20 0500) SpO2:  [95 %-98 %] 95 % (05/20 0500)  Intake/Output from previous day: 05/19 0701 - 05/20 0700 In: 866 [P.O.:600; I.V.:266] Out: 1925 [Urine:1925] Intake/Output from this shift:    Physical Exam: General appearance: alert, cooperative, appears stated age and no distress Neck: no adenopathy, no carotid bruit, no JVD, supple, symmetrical, trachea midline and thyroid not enlarged, symmetric, no tenderness/mass/nodules Lungs: rhonchi bilaterally and crackles and no wheezing or "wet rales" Heart: irregularly irregular rhythm, no S3 or S4, no rub and no murmur Abdomen: soft, non-tender; bowel sounds normal; no masses,  no organomegaly Extremities: extremities normal, atraumatic, no cyanosis or edema Pulses: 2+ and symmetric Neurologic: Grossly normal; CN II-XII grossly intact.  Lab Results:  Recent Labs  02/23/13 0530 02/24/13 0527  WBC 11.0* 6.7  HGB 11.8* 11.8*  PLT 340 366    Recent Labs  02/22/13 0730  NA 135  K 3.4*  CL 101  CO2 20  GLUCOSE 103*  BUN 10  CREATININE 0.62    Recent Labs  02/23/13 1956 02/24/13 0158  TROPONINI 1.47* 7.57*   Hepatic Function Panel  Recent Labs  02/22/13 0730  PROT 7.8  ALBUMIN 2.9*  AST 20  ALT 13  ALKPHOS 80  BILITOT 0.5  BILIDIR 0.2  IBILI 0.3   Imaging: Imaging results have been reviewed  Cardiac Studies: Echo reviewed:  EF moderately reduced 40-45%, global hypokinesis (no regional), trivial pericardial effusion (no hemodynamic compromise)  Assessment/Plan:  Principal  Problem:   NSTEMI (non-ST elevated myocardial infarction) Active Problems:   Atrial fibrillation with RVR   Lupus   Multifocal atrial tachycardia   Pulmonary fibrosis, postinflammatory   Pleuritic chest pain  I agree with DDx of NSTEMI vs. myo-pericarditis (especially with global hypokinesis) - As discussed with Dr. Erlinda Hong yesterday, the definitive diagnostic technique is Coronary Angiography -- will proceed today.    The procedure with Risks/Benefits/Alternatives and Indications was reviewed with the patient.  All questions were answered.    Risks / Complications include, but not limited to: Death, MI, CVA/TIA, VF/VT (with defibrillation), Bradycardia (need for temporary pacer placement), contrast induced nephropathy, bleeding / bruising / hematoma / pseudoaneurysm, vascular or coronary injury (with possible emergent CT or Vascular Surgery), adverse medication reactions, infection.    The patient voices understanding and agree to proceed with femoral approach (concern with CT - disease, risk of spasm).  Pending results, further Rx regimen.   Agree with BB for rate control of Afib - with reduced EF, would also need to consider long term AC -- agree NOAC may be best choice, but will defer initiation of Rx until her coronary anatomy has been evaluated with +/- PCI.  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 --? Use of steroids vs. NSAIDS.    LOS: 2 days    Arilla Hice W 02/24/2013, 8:40 AM

## 2013-02-24 NOTE — Interval H&P Note (Signed)
History and Physical Interval Note:  02/24/2013 9:06 AM  Carolyn Proctor  has presented today for cardiac catheterization (to delineate her coronary anatomy), with the diagnosis of NSTEMI vs. Myopericarditis with chest pain and reduced LVEF. The various methods of treatment have been discussed with the patient and family. After consideration of risks, benefits and other options for treatment, the patient has consented to  Procedure(s): LEFT HEART CATHETERIZATION WITH CORONARY ANGIOGRAM (N/A) as a surgical intervention .    The patient's history has been reviewed, patient examined, no change in status, stable for surgery.  I have reviewed the patient's chart and labs.  Questions were answered to the patient's satisfaction.     Thais Silberstein W

## 2013-02-24 NOTE — Progress Notes (Signed)
Subjective:  Mild chest discomfort this AM -- improved with sitting up, but worse with bending over.  Feels like a foot on her chest with associated SOB.   Denies palpitations or SOB without chest discomfort.  Objective:  Vital Signs in the last 24 hours: Temp:  [97.6 F (36.4 C)-98.1 F (36.7 C)] 97.6 F (36.4 C) (05/20 0500) Pulse Rate:  [83-100] 91 (05/20 0500) Resp:  [18-20] 18 (05/20 0500) BP: (80-129)/(40-79) 99/66 mmHg (05/20 0500) SpO2:  [95 %-98 %] 95 % (05/20 0500)  Intake/Output from previous day: 05/19 0701 - 05/20 0700 In: 866 [P.O.:600; I.V.:266] Out: 1925 [Urine:1925] Intake/Output from this shift:    Physical Exam: General appearance: alert, cooperative, appears stated age and no distress Neck: no adenopathy, no carotid bruit, no JVD, supple, symmetrical, trachea midline and thyroid not enlarged, symmetric, no tenderness/mass/nodules Lungs: rhonchi bilaterally and crackles and no wheezing or "wet rales" Heart: irregularly irregular rhythm, no S3 or S4, no rub and no murmur Abdomen: soft, non-tender; bowel sounds normal; no masses,  no organomegaly Extremities: extremities normal, atraumatic, no cyanosis or edema Pulses: 2+ and symmetric Neurologic: Grossly normal; CN II-XII grossly intact.  Lab Results:  Recent Labs  02/23/13 0530 02/24/13 0527  WBC 11.0* 6.7  HGB 11.8* 11.8*  PLT 340 366    Recent Labs  02/22/13 0730  NA 135  K 3.4*  CL 101  CO2 20  GLUCOSE 103*  BUN 10  CREATININE 0.62    Recent Labs  02/23/13 1956 02/24/13 0158  TROPONINI 1.47* 7.57*   Hepatic Function Panel  Recent Labs  02/22/13 0730  PROT 7.8  ALBUMIN 2.9*  AST 20  ALT 13  ALKPHOS 80  BILITOT 0.5  BILIDIR 0.2  IBILI 0.3   Imaging: Imaging results have been reviewed  Cardiac Studies: Echo reviewed:  EF moderately reduced 40-45%, global hypokinesis (no regional), trivial pericardial effusion (no hemodynamic compromise)  Assessment/Plan:  Principal  Problem:   NSTEMI (non-ST elevated myocardial infarction) Active Problems:   Atrial fibrillation with RVR   Lupus   Multifocal atrial tachycardia   Pulmonary fibrosis, postinflammatory   Pleuritic chest pain  I agree with DDx of NSTEMI vs. myo-pericarditis (especially with global hypokinesis) - As discussed with Dr. MC yesterday, the definitive diagnostic technique is Coronary Angiography -- will proceed today.    The procedure with Risks/Benefits/Alternatives and Indications was reviewed with the patient.  All questions were answered.    Risks / Complications include, but not limited to: Death, MI, CVA/TIA, VF/VT (with defibrillation), Bradycardia (need for temporary pacer placement), contrast induced nephropathy, bleeding / bruising / hematoma / pseudoaneurysm, vascular or coronary injury (with possible emergent CT or Vascular Surgery), adverse medication reactions, infection.    The patient voices understanding and agree to proceed with femoral approach (concern with CT - disease, risk of spasm).  Pending results, further Rx regimen.   Agree with BB for rate control of Afib - with reduced EF, would also need to consider long term AC -- agree NOAC may be best choice, but will defer initiation of Rx until her coronary anatomy has been evaluated with +/- PCI.  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 --? Use of steroids vs. NSAIDS.    LOS: 2 days    HARDING,DAVID W 02/24/2013, 8:40 AM    

## 2013-02-24 NOTE — Progress Notes (Signed)
Cath findings confirm a diagnosis of acute myopericarditis. She has minimally depressed LVEF, no clinical manifestations of CHF and no ventricular arrhythmia. Immunological results are not particularly helpful - complement normal, ANA borderline at 1:40, rheumatoid factor high. Discussed general management with Dr. Corliss Skains, who is not available for inpatient consults, but kindly agrees to see her in follow up. Will start colchicine and low dose prednisone. ESR  Is elevated and will be useful to monitor response to therapy. Until EF normalizes, I think it is wise to continue anticoagulation for AF-related embolism prevention.

## 2013-02-24 NOTE — Progress Notes (Signed)
Received order for CR "If PCI" however pt did not receive PCI. Will be happy to ambulate pt if new order is written. Please order if you agree. Thx. Ethelda Chick CES, ACSM

## 2013-02-24 NOTE — Progress Notes (Signed)
Troponin I result of 7.57 (previous result 1.47) noted. No patient complaint of chest pain, tightness, SOB or other associative symptoms. Currently sinus rhythm with frequent multifocal PVCs on telemetry; has had paroxysmal bursts of SVT, atrial fibrillation and atrial flutter since prior to this shift. Awaiting cardiac catheterization per Dr. Royann Shivers in AM on heparin drip. Spoke with Dr. Lurline Idol, on-call for [facility] regarding findings. No further intervention necessary at this time, per MD.  Continuing to closely monitor.

## 2013-02-25 DIAGNOSIS — R7989 Other specified abnormal findings of blood chemistry: Secondary | ICD-10-CM

## 2013-02-25 DIAGNOSIS — I498 Other specified cardiac arrhythmias: Secondary | ICD-10-CM

## 2013-02-25 DIAGNOSIS — R778 Other specified abnormalities of plasma proteins: Secondary | ICD-10-CM

## 2013-02-25 DIAGNOSIS — I309 Acute pericarditis, unspecified: Principal | ICD-10-CM

## 2013-02-25 DIAGNOSIS — I4891 Unspecified atrial fibrillation: Secondary | ICD-10-CM

## 2013-02-25 HISTORY — DX: Other specified abnormalities of plasma proteins: R77.8

## 2013-02-25 HISTORY — DX: Other specified abnormal findings of blood chemistry: R79.89

## 2013-02-25 LAB — CBC
HCT: 33.2 % — ABNORMAL LOW (ref 36.0–46.0)
Hemoglobin: 11.2 g/dL — ABNORMAL LOW (ref 12.0–15.0)
MCH: 32.7 pg (ref 26.0–34.0)
MCHC: 33.7 g/dL (ref 30.0–36.0)
MCV: 96.8 fL (ref 78.0–100.0)
Platelets: 368 10*3/uL (ref 150–400)
RBC: 3.43 MIL/uL — ABNORMAL LOW (ref 3.87–5.11)
RDW: 12.7 % (ref 11.5–15.5)
WBC: 5.9 10*3/uL (ref 4.0–10.5)

## 2013-02-25 MED ORDER — PREDNISONE 10 MG PO TABS: 10.0000 mg | ORAL_TABLET | Freq: Every day | ORAL | Status: DC

## 2013-02-25 MED ORDER — METOPROLOL TARTRATE 25 MG PO TABS: 25.0000 mg | ORAL_TABLET | Freq: Two times a day (BID) | ORAL | Status: DC

## 2013-02-25 MED ORDER — RIVAROXABAN 20 MG PO TABS: 20.0000 mg | ORAL_TABLET | Freq: Every day | ORAL | Status: DC

## 2013-02-25 MED ORDER — ACETAMINOPHEN 325 MG PO TABS: 650.0000 mg | ORAL_TABLET | ORAL | Status: DC | PRN

## 2013-02-25 MED ORDER — COLCHICINE 0.6 MG PO TABS: 0.6000 mg | ORAL_TABLET | Freq: Two times a day (BID) | ORAL | Status: DC

## 2013-02-25 MED ORDER — COLCHICINE 0.6 MG PO TABS
0.6000 mg | ORAL_TABLET | Freq: Two times a day (BID) | ORAL | Status: DC
  Filled 2013-02-25: qty 1

## 2013-02-25 NOTE — Progress Notes (Signed)
Subjective:  Chest pain better.  Objective:  Vital Signs in the last 24 hours: Temp:  [97.6 F (36.4 C)-98.3 F (36.8 C)] 98.3 F (36.8 C) (05/21 0548) Pulse Rate:  [76-86] 86 (05/21 0944) Resp:  [17-18] 18 (05/21 0548) BP: (110-142)/(61-88) 140/85 mmHg (05/21 0944) SpO2:  [96 %-99 %] 99 % (05/21 0548)  Intake/Output from previous day:  Intake/Output Summary (Last 24 hours) at 02/25/13 1016 Last data filed at 02/24/13 1600  Gross per 24 hour  Intake  823.2 ml  Output    550 ml  Net  273.2 ml    Physical Exam: General appearance: alert, cooperative, cachectic and no distress Lungs: clear to auscultation bilaterally Heart: regular rate and rhythm and frequent extra systole Rt groin without hematoma   Rate: 84  Rhythm: normal sinus rhythm and premature atrial contractions (PAC)  Lab Results:  Recent Labs  02/24/13 0527 02/25/13 0505  WBC 6.7 5.9  HGB 11.8* 11.2*  PLT 366 368    Recent Labs  02/24/13 0842  NA 139  K 3.5  CL 105  CO2 24  GLUCOSE 83  BUN 5*  CREATININE 0.73    Recent Labs  02/23/13 1956 02/24/13 0158  TROPONINI 1.47* 7.57*   Hepatic Function Panel No results found for this basename: PROT, ALBUMIN, AST, ALT, ALKPHOS, BILITOT, BILIDIR, IBILI,  in the last 72 hours No results found for this basename: CHOL,  in the last 72 hours  Recent Labs  02/24/13 0842  INR 1.50*    Imaging: Imaging results have been reviewed  Cardiac Studies:  Assessment/Plan:   Principal Problem:   Acute myopericarditis Active Problems:   Atrial fibrillation with RVR   Multifocal atrial tachycardia   Elevated troponin- secondary to Myopericarditis   Red cell aplasia   Thrombocytosis   Lupus   Pulmonary fibrosis, postinflammatory   Pleuritic chest pain    PLAN:  See Dr Erin Hearing note from yesterday. Will discuss which anticoagulant to use (? Xarelto). Dr Berenice Bouton office to call pt for follow up.   Corine Shelter PA-C Beeper  [phone] 02/25/2013, 10:16 AM

## 2013-02-25 NOTE — Progress Notes (Signed)
Pt. Seen and examined. Agree with the NP/PA-C note as written.  Chest pain has improved with steroids and prednisone.  I think she can be discharged home today with early follow-up in our office, with Dr. Corliss Skains and with Dr. Gustavo Lah office. Continue prednisone 10 mg daily for at least 2 weeks before considering taper to 5 mg daily. Agree with colchicine 0.6 mg (but should be BID) initially, then decrease for maintenance. She will also benefit from aspirin therapy, but do not want to use higher dose NSAIDs due to the need for anticoagulation for atrial fibrillation.  I agree that xarelto may be a good option for her at 20 mg daily.   Chrystie Nose, MD, [facility] Attending Cardiologist [facility]

## 2013-02-25 NOTE — Care Management Note (Signed)
    Page 1 of 1   02/25/2013     1:54:01 PM   CARE MANAGEMENT NOTE 02/25/2013  Patient:  Carolyn Proctor, Carolyn Proctor   Account Number:  [id number]  Date Initiated:  02/25/2013  Documentation initiated by:  GRAVES-BIGELOW,[place]  Subjective/Objective Assessment:   Pt admitted with SOB and CP. Pt to be d/c today on xarelto.     Action/Plan:   CM did call [facility] and medication is available. CO pay will be 75.00. CM made pt awre and provided pt with discount savings card. Pt aware to call the number before going to pick up meds to see if qualifies for 10.00 copay.   Anticipated DC Date:  02/25/2013   Anticipated DC Plan:  HOME/SELF CARE      DC Planning Services  CM consult  Medication Assistance      Choice offered to / List presented to:             Status of service:  Completed, signed off Medicare Important Message given?   (If response is "NO", the following Medicare IM given date fields will be blank) Date Medicare IM given:   Date Additional Medicare IM given:    Discharge Disposition:  HOME/SELF CARE  Per UR Regulation:  Reviewed for med. necessity/level of care/duration of stay  If discussed at Long Length of Stay Meetings, dates discussed:    Comments:  No further needs at this time.

## 2013-02-25 NOTE — Discharge Summary (Signed)
Patient ID: Carolyn Proctor,  MRN: [id number], DOB/AGE: 06/10/1961 52 y.o.  Admit date: 02/22/2013 Discharge date: 02/25/2013  Primary Care Provider:  Primary Cardiologist: Dr Royann Shivers  Discharge Diagnoses Principal Problem:   Acute myopericarditis Active Problems:   Atrial fibrillation with RVR   Multifocal atrial tachycardia   Elevated troponin- secondary to Myopericarditis   Red cell aplasia   Thrombocytosis   Lupus   Pulmonary fibrosis, postinflammatory   Pleuritic chest pain    Procedures: Coronary angiogram 02/24/13   Hospital Course:  This is a 52 y.o. 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 She presents today with complaints of chest discomfort and is found to be in atrial fibrillation rapid ventricular response.  Her chest pain began approximately 48 hours ago and radiates through to her back. It is not associated with exertion. It is present currently but has improved after opiate analgesics. The pain is positional and worsens when she bends over or turns onto her right or left side. The chest wall is nontender. Discomfort worsens slightly with deep inspiration but not coughing. Yesterday she felt some minor chills but has not had fever, coughing, hemoptysis, wheezing, calf swelling or tenderness. She also denies orthopnea paroxysmal nocturnal dyspnea, recent focal neurological deficits, lower extremity edema or intermittent claudication.  She was last seen in our practice over 3 years ago by Dr. Lynnea Ferrier. I cannot retrieve those records at this time. It sounds like she has had atrial fibrillation before. She underwent an echocardiogram and what sounds at the nuclear stress test which were "good".  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 She responded well to treatment with steroids which have subsequently been weaned off and she has been in remission without therapy for over a year. At one point she was seeing a rheumatologist, I think Dr.Deveshwar, as well as a pulmonologist (the name of whom she cannot recall).    She presented to the ER 02/22/13 with rapid AF and chest pain with a pleuritic component. She was admitted and started on IV Heparin and IV Diltiazem. Her rate was better controlled and she was transitioned to oral beta blocker. She converted spontaneously to NSR. Her Troponin was noted to be elevated. Echo revealed global HK. It was decided to proceed with coronary angiogram and this was done 02/24/13. This revealed normal coronary arteries and an EF of 50-55%. Dr Royann Shivers spoke with Dr Titus Dubin and it was decided to treat her with steroids, Xarelto, and colchicine. She will be contacted by Dr Denita Lung office for follow up and she will see Dr Royann Shivers as well.   Discharge Vitals:  Blood pressure 140/85, pulse 86, temperature 98.3 F (36.8 C), temperature source Oral, resp. rate 18, height 5\' 11"  (1.803 m), weight 64.411 kg (142 lb), last menstrual period 12/23/2012, SpO2 99.00%.    Labs: Results for orders placed during the hospital encounter of 02/22/13 (from the past 48 hour(s))  TROPONIN I     Status: Abnormal   Collection Time    02/23/13  2:09 PM      Result Value Range   Troponin I 1.56 (*) <0.30 ng/mL   Comment:            Due to the release kinetics  of cTnI,     a negative result within the first hours     of the onset of symptoms does not rule out     myocardial infarction with certainty.     If myocardial infarction is still suspected,     repeat the test at appropriate intervals.     CRITICAL VALUE NOTED.  VALUE IS CONSISTENT WITH PREVIOUSLY REPORTED AND CALLED VALUE.  TROPONIN I     Status: Abnormal   Collection Time    02/23/13  7:56 PM       Result Value Range   Troponin I 1.47 (*) <0.30 ng/mL   Comment:            Due to the release kinetics of cTnI,     a negative result within the first hours     of the onset of symptoms does not rule out     myocardial infarction with certainty.     If myocardial infarction is still suspected,     repeat the test at appropriate intervals.     CRITICAL VALUE NOTED.  VALUE IS CONSISTENT WITH PREVIOUSLY REPORTED AND CALLED VALUE.  TROPONIN I     Status: Abnormal   Collection Time    02/24/13  1:58 AM      Result Value Range   Troponin I 7.57 (*) <0.30 ng/mL   Comment:            Due to the release kinetics of cTnI,     a negative result within the first hours     of the onset of symptoms does not rule out     myocardial infarction with certainty.     If myocardial infarction is still suspected,     repeat the test at appropriate intervals.     CRITICAL VALUE NOTED.  VALUE IS CONSISTENT WITH PREVIOUSLY REPORTED AND CALLED VALUE.  HEPARIN LEVEL (UNFRACTIONATED)     Status: Abnormal   Collection Time    02/24/13  5:27 AM      Result Value Range   Heparin Unfractionated 0.22 (*) 0.30 - 0.70 IU/mL   Comment:            IF HEPARIN RESULTS ARE BELOW     EXPECTED VALUES, AND PATIENT     DOSAGE HAS BEEN CONFIRMED,     SUGGEST FOLLOW UP TESTING     OF ANTITHROMBIN III LEVELS.  CBC     Status: Abnormal   Collection Time    02/24/13  5:27 AM      Result Value Range   WBC 6.7  4.0 - 10.5 K/uL   RBC 3.64 (*) 3.87 - 5.11 MIL/uL   Hemoglobin 11.8 (*) 12.0 - 15.0 g/dL   HCT 96.0 (*) 45.4 - 09.8 %   MCV 95.9  78.0 - 100.0 fL   MCH 32.4  26.0 - 34.0 pg   MCHC 33.8  30.0 - 36.0 g/dL   RDW 11.9  14.7 - 82.9 %   Platelets 366  150 - 400 K/uL  PROTIME-INR     Status: Abnormal   Collection Time    02/24/13  8:42 AM      Result Value Range   Prothrombin Time 17.7 (*) 11.6 - 15.2 seconds   INR 1.50 (*) 0.00 - 1.49  BASIC METABOLIC PANEL     Status: Abnormal   Collection Time     02/24/13  8:42 AM      Result Value Range   Sodium  139  135 - 145 mEq/L   Potassium 3.5  3.5 - 5.1 mEq/L   Chloride 105  96 - 112 mEq/L   CO2 24  19 - 32 mEq/L   Glucose, Bld 83  70 - 99 mg/dL   BUN 5 (*) 6 - 23 mg/dL   Creatinine, Ser 4.09  0.50 - 1.10 mg/dL   Calcium 8.6  8.4 - 81.1 mg/dL   GFR calc non Af Amer >90  >90 mL/min   GFR calc Af Amer >90  >90 mL/min   Comment:            The eGFR has been calculated     using the CKD EPI equation.     This calculation has not been     validated in all clinical     situations.     eGFR's persistently     <90 mL/min signify     possible Chronic Kidney Disease.  POCT ACTIVATED CLOTTING TIME     Status: None   Collection Time    02/24/13 10:45 AM      Result Value Range   Activated Clotting Time 165    CBC     Status: Abnormal   Collection Time    02/25/13  5:05 AM      Result Value Range   WBC 5.9  4.0 - 10.5 K/uL   RBC 3.43 (*) 3.87 - 5.11 MIL/uL   Hemoglobin 11.2 (*) 12.0 - 15.0 g/dL   HCT 91.4 (*) 78.2 - 95.6 %   MCV 96.8  78.0 - 100.0 fL   MCH 32.7  26.0 - 34.0 pg   MCHC 33.7  30.0 - 36.0 g/dL   RDW 21.3  08.6 - 57.8 %   Platelets 368  150 - 400 K/uL    Disposition:  Follow-up Information   Follow up with Susy Frizzle, MD. (Office will contact you)    Contact information:   [address] [place] [address] [phone]       Follow up with CROITORU,MIHAI, MD On 03/10/2013. (8:15)    Contact information:   [address] [place] [address] [phone]       Discharge Medications:    Medication List    TAKE these medications       acetaminophen 325 MG tablet  Commonly known as:  TYLENOL  Take 2 tablets (650 mg total) by mouth every 4 (four) hours as needed for pain or fever.     colchicine 0.6 MG tablet  Take 1 tablet (0.6 mg total) by mouth 2 (two) times daily.     folic acid 1 MG tablet  Commonly known as:  FOLVITE  Take 1 mg by mouth daily.     ibuprofen 200  MG tablet  Commonly known as:  ADVIL,MOTRIN  Take 400 mg by mouth every 6 (six) hours as needed for pain. For pain     metoprolol tartrate 25 MG tablet  Commonly known as:  LOPRESSOR  Take 1 tablet (25 mg total) by mouth 2 (two) times daily.     multivitamin with minerals Tabs  Take 1 tablet by mouth daily.     predniSONE 10 MG tablet  Commonly known as:  DELTASONE  Take 1 tablet (10 mg total) by mouth daily before breakfast.     Rivaroxaban 20 MG Tabs  Commonly known as:  XARELTO  Take 1 tablet (20 mg total) by mouth daily.  Start taking on:  02/26/2013  Vitamin D (Ergocalciferol) 50000 UNITS Caps  Commonly known as:  DRISDOL  Take 50,000 Units by mouth 2 (two) times a week. tueday and friday         Duration of Discharge Encounter: Greater than 30 minutes including physician time.  Jolene Provost PA-C 02/25/2013 12:46 PM

## 2013-02-25 NOTE — Progress Notes (Signed)
D/c orders received;IV removed with gauze on, pt remains in stable condition, pt meds and instructions reviewed and given to pt; pt d/c to home 

## 2013-03-10 ENCOUNTER — Encounter: Payer: Self-pay | Admitting: Cardiovascular Disease

## 2013-03-10 ENCOUNTER — Ambulatory Visit (INDEPENDENT_AMBULATORY_CARE_PROVIDER_SITE_OTHER): Payer: BC Managed Care – PPO | Admitting: Cardiovascular Disease

## 2013-03-10 VITALS — BP 136/80 | HR 71 | Resp 20 | Ht 72.0 in | Wt 147.6 lb

## 2013-03-10 DIAGNOSIS — I4891 Unspecified atrial fibrillation: Secondary | ICD-10-CM

## 2013-03-10 DIAGNOSIS — I309 Acute pericarditis, unspecified: Secondary | ICD-10-CM

## 2013-03-10 LAB — SEDIMENTATION RATE: Sed Rate: 5 mm/hr (ref 0–22)

## 2013-03-10 NOTE — Patient Instructions (Addendum)
Your physician has requested that you have an echocardiogram. Echocardiography is a painless test that uses sound waves to create images of your heart. It provides your doctor with information about the size and shape of your heart and how well your heart's chambers and valves are working. This procedure takes approximately one hour. There are no restrictions for this procedure. Your physician recommends that you return for lab work today. Your physician recommends that you schedule a follow-up appointment for 3 weeks. MED INSTRUCTIONS: Reduce colchicine to once daily. You may take imodium for diarrhea.

## 2013-03-11 LAB — TROPONIN I: Troponin I: 0.01 ng/mL (ref <0.06)

## 2013-03-16 ENCOUNTER — Encounter: Payer: Self-pay | Admitting: Cardiovascular Disease

## 2013-03-16 NOTE — Assessment & Plan Note (Signed)
This appears to be related to the episode of acute myopericarditis and therefore may be a one-time phenomenon. Nevertheless since she has depressed left extrasystolic function is important to provide anticoagulation therapy. Beta blockers may help in many ways including ventricular rate control, reduction frequency of atrial fibrillation episodes, improvement in left ventricular systolic function the long-term.

## 2013-03-16 NOTE — Assessment & Plan Note (Addendum)
Patient with pleuritic chest pain, moderately elevated troponin in a waxing and waning pattern, subtle ST segment changes and mildly depressed left ventricular systolic function with global hypokinesis on a background of collagen vascular disease.  She has not developed high-risk features such as clinical congestive heart failure and arrhythmia.  She has responded promptly and apparently completely to treatment with low-dose prednisone and colchicine. Followup echocardiography to confirm resolution of left ventricular hypokinesis will be important. She has a followup appointment scheduled with a rheumatologist in the near future. The exact type of connective tissue disease that she harbors is unclear. She definitely has features that suggest lupus, scleroderma, rheumatoid arthritis and possibly even polymyositis.

## 2013-03-16 NOTE — Progress Notes (Signed)
Patient ID: Carolyn Proctor, female   DOB: 17-Oct-1960, 52 y.o.   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 She was admitted to [facility] with complaints of pleuritic chest pain associated with atrial fibrillation rapid ventricular response. Coronary angiography does not show evidence of coronary disease, but cardiac enzymes were elevated in a waxing and waning pattern. There were subtle ST segment elevation several EKG leads and the appearance was of acute myopericarditis. 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 Her sedimentation rate was markedly elevated at 72.  She has improved quickly, following treatment with colchicine and steroids, and now is essentially asymptomatic.    No Known Allergies  Current Outpatient Prescriptions  Medication Sig Dispense Refill  . acetaminophen (TYLENOL) 325 MG tablet Take 2 tablets (650 mg total) by mouth every 4 (four) hours as needed for pain or fever.      . colchicine 0.6 MG tablet Take 1 tablet (0.6 mg total) by mouth 2 (two) times daily.  60 tablet  5  . folic acid (FOLVITE) 1 MG tablet Take 1 mg by mouth daily.      Marland Kitchen ibuprofen (ADVIL,MOTRIN) 200 MG tablet Take 400 mg by mouth every 6 (six) hours as needed for pain. For pain      . metoprolol tartrate (LOPRESSOR) 25 MG tablet Take 1 tablet (25 mg total) by mouth 2 (two) times daily.  60 tablet  5  . Multiple Vitamin (MULTIVITAMIN WITH MINERALS) TABS Take 1 tablet by mouth daily.      . predniSONE  (DELTASONE) 10 MG tablet Take 1 tablet (10 mg total) by mouth daily before breakfast.  30 tablet  1  . Rivaroxaban (XARELTO) 20 MG TABS Take 1 tablet (20 mg total) by mouth daily.  30 tablet  5  . Vitamin D, Ergocalciferol, (DRISDOL) 50000 UNITS CAPS Take 50,000 Units by mouth 2 (two) times a week. tueday and friday       No current facility-administered medications for this visit.    Past Medical History  Diagnosis Date  . 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 Lupus 09/14/2011  . Hypertension   . Diverticulitis   . Atrial fibrillation     No past surgical history on file.  Family History  Problem Relation Age of Onset  . Lupus Mother   . Liver disease Mother   . Lupus Brother   . Hypertension Maternal Grandmother     History   Social History  . Marital Status: Divorced    Spouse Name: N/A    Number of Children: N/A  . Years of Education: N/A   Occupational History  . Not on file.   Social History Main Topics  . Smoking status: Never Smoker   . Smokeless tobacco: Not on file  . Alcohol Use: No  . Drug Use: No  . Sexually Active: No   Other Topics Concern  . Not on file   Social History Narrative  . No narrative on file    Review of systems: The patient specifically denies  any chest pain at rest exertion, dyspnea at rest or with exertion, orthopnea, paroxysmal nocturnal dyspnea, syncope, palpitations, focal neurological deficits, intermittent claudication, lower extremity edema, unexplained weight gain, cough, hemoptysis or wheezing.   PHYSICAL EXAM BP 136/80  Pulse 71  Resp 20  Ht 6' (1.829 m)  Wt 66.951 kg (147 lb 9.6 oz)  BMI 20.01 kg/m2  LMP 12/23/2012  General: Alert, oriented x3, no distress Head: no evidence of trauma, PERRL, EOMI, no exophtalmos or lid lag, no myxedema, no xanthelasma; normal ears, nose and oropharynx Neck: normal jugular venous pulsations and no hepatojugular reflux; brisk carotid pulses without delay and  no carotid bruits Chest: clear to auscultation, no signs of consolidation by percussion or palpation, normal fremitus, symmetrical and full respiratory excursions Cardiovascular: normal position and quality of the apical impulse, regular rhythm, normal first and second heart sounds, no murmurs, rubs or gallops Abdomen: no tenderness or distention, no masses by palpation, no abnormal pulsatility or arterial bruits, normal bowel sounds, no hepatosplenomegaly Extremities: no clubbing, cyanosis or edema; subtle sclerodactyly is present on the distal phalanges of several digits; 2+ radial, ulnar and brachial pulses bilaterally; 2+ right femoral, posterior tibial and dorsalis pedis pulses; 2+ left femoral, posterior tibial and dorsalis pedis pulses; no subclavian or femoral bruits Neurological: grossly nonfocal   EKG: Normal sinus rhythm inverted T waves are now seen in leads 32F V5 and V6 suggesting progressive changes of acute pericarditis  Lipid Panel     Component Value Date/Time   CHOL  Value: 137        ATP III CLASSIFICATION:  <200     mg/dL   Desirable  295-284  mg/dL   Borderline High  >=132    mg/dL   High 44/10/270 5366   TRIG 48 07/18/2007 0305   HDL 39* 07/18/2007 0305   CHOLHDL 3.5 07/18/2007 0305   VLDL 10 07/18/2007 0305   LDLCALC  Value: 88        Total Cholesterol/HDL:CHD Risk Coronary Heart Disease Risk Table                     Men   Women  1/2 Average Risk   3.4   3.3 07/18/2007 0305    BMET    Component Value Date/Time   NA 139 02/24/2013 0842   K 3.5 02/24/2013 0842   CL 105 02/24/2013 0842   CO2 24 02/24/2013 0842   GLUCOSE 83 02/24/2013 0842   BUN 5* 02/24/2013 0842   CREATININE 0.73 02/24/2013 0842   CALCIUM 8.6 02/24/2013 0842   GFRNONAA >90 02/24/2013 0842   GFRAA >90 02/24/2013 0842     ASSESSMENT AND PLAN Acute myopericarditis Patient with pleuritic chest pain, moderately elevated troponin in a waxing and waning pattern, subtle ST segment changes and mildly  depressed left ventricular systolic function with global hypokinesis on a background of collagen vascular disease.  She has not developed high-risk features such as clinical congestive heart failure and arrhythmia.  She has responded promptly and apparently completely to treatment with low-dose prednisone and colchicine. Followup echocardiography to confirm resolution of left ventricular hypokinesis will be important. She has a followup appointment scheduled with a rheumatologist in the near future. The exact type of connective tissue disease that she harbors is unclear. She definitely has features that suggest lupus, scleroderma, rheumatoid arthritis and possibly even polymyositis.  Atrial fibrillation with RVR This appears to be related to the episode of acute myopericarditis and therefore may be a  one-time phenomenon. Nevertheless since she has depressed left extrasystolic function is important to provide anticoagulation therapy. Beta blockers may help in many ways including ventricular rate control, reduction frequency of atrial fibrillation episodes, improvement in left ventricular systolic function the long-term.  I have recommended that she continue the same therapy until she follows up with Dr.Deveshwar.  Followup echo is scheduled. We'll also repeat the erythrocyte sedimentation rate and troponin level to see if the clinical impression of myopericarditis in remission is confirmed by biochemical measurements  Orders Placed This Encounter  Procedures  . Sed Rate (ESR)  . Troponin I  . EKG 12-Lead  . 2D Echocardiogram with contrast    Ludger Bones  Thurmon Fair, MD, [facility] [phone] office [phone] pager

## 2013-03-17 ENCOUNTER — Telehealth: Payer: Self-pay | Admitting: Cardiovascular Disease

## 2013-03-17 NOTE — Telephone Encounter (Signed)
Pt states that Dr. Salena Saner told her to call if she had any abnormal bleeding--she is on her cycle and bleeding is heavier than normal---pt is on Xarelto.   Please advise.

## 2013-03-17 NOTE — Telephone Encounter (Signed)
Returned call.  Pt stated Dr. Salena Saner told her to let him know if she is having any abnormal bleeding.  Stated her cycle came on and it has been on for 7 days.  Pt stated she didn't have a cycle last month or the month before.  Stated it usually lasts for about 3-5 days.  Denied using >1pad/hour.  Stated there's not much bleeding and she sees it after urinating.  Pt informed her symptoms are likely not related to Xarelto.  Pt advised to monitor bleeding and if she saturates >1pad/hour to go to ER, especially if she is having dizziness, fatigue and lightheadedness.  Pt verbalized understanding.  Pt also c/o having a lot of clots and informed this is likely r/t her not having a cycle in the last 2 months, which may be why her cycle is still on.  Pt advised to f/u with GYN if cycle continues or other gyn issues arise.  Pt verbalized understanding and agreed w/ plan.  Pt also c/o having a fluttering in her artery or vein in her neck on the right when she wakes up in the morning.  Stated when she gets up and moves around it goes away.  Pt informed Dr. Royann Shivers will be notified of this and RN will f/u.  Pt has an appt scheduled for 6/23 w/ Dr. Royann Shivers and informed a sooner appt will be scheduled if needed.  Pt verbalized understanding and agreed w/ plan.  Message forwarded to Dr. Royann Shivers.

## 2013-03-17 NOTE — Telephone Encounter (Signed)
The fluttering does not sound worrisome as long as it does not associate shortness of breath or dizziness or chest pain. It is very likely that she has brief irregularities in her heart rhythm. If it becomes more troublesome we will get a 24 hour Holter monitor.  As far as the vaginal bleeding, Xarelto does not start the bleeding, but will make any bleeding event more prominent and longer lasting. I agree she should see her ObGyn if the problem lasts over 7-10 days.

## 2013-03-17 NOTE — Telephone Encounter (Signed)
Returned call.  Left message to call tomorrow between 8am and 4pm.

## 2013-03-18 NOTE — Telephone Encounter (Signed)
Returned call and informed pt per instructions by MD/PA.  Pt verbalized understanding and agreed w/ plan.  

## 2013-03-24 ENCOUNTER — Ambulatory Visit (HOSPITAL_COMMUNITY)
Admission: RE | Admit: 2013-03-24 | Discharge: 2013-03-24 | Disposition: A | Discharge status: Home / Self Care | Payer: BC Managed Care – PPO | Source: Ambulatory Visit | Attending: Cardiovascular Disease | Admitting: Cardiovascular Disease

## 2013-03-24 DIAGNOSIS — I309 Acute pericarditis, unspecified: Secondary | ICD-10-CM

## 2013-03-24 DIAGNOSIS — I270 Primary pulmonary hypertension: Secondary | ICD-10-CM | POA: Insufficient documentation

## 2013-03-24 DIAGNOSIS — R079 Chest pain, unspecified: Secondary | ICD-10-CM

## 2013-03-24 DIAGNOSIS — I4891 Unspecified atrial fibrillation: Secondary | ICD-10-CM | POA: Insufficient documentation

## 2013-03-24 NOTE — Progress Notes (Signed)
2D Echo Performed 03/24/2013    Clearence Ped, RCS

## 2013-03-31 ENCOUNTER — Ambulatory Visit (INDEPENDENT_AMBULATORY_CARE_PROVIDER_SITE_OTHER): Payer: BC Managed Care – PPO | Admitting: Cardiology

## 2013-03-31 ENCOUNTER — Ambulatory Visit: Payer: BC Managed Care – PPO | Admitting: Cardiology

## 2013-03-31 ENCOUNTER — Ambulatory Visit: Payer: BC Managed Care – PPO | Admitting: Cardiovascular Disease

## 2013-03-31 ENCOUNTER — Encounter: Payer: Self-pay | Admitting: Cardiology

## 2013-03-31 VITALS — BP 130/82 | HR 76 | Ht 71.0 in | Wt 150.8 lb

## 2013-03-31 DIAGNOSIS — I4891 Unspecified atrial fibrillation: Secondary | ICD-10-CM

## 2013-03-31 DIAGNOSIS — Z7901 Long term (current) use of anticoagulants: Secondary | ICD-10-CM

## 2013-03-31 DIAGNOSIS — M329 Systemic lupus erythematosus, unspecified: Secondary | ICD-10-CM

## 2013-03-31 DIAGNOSIS — I309 Acute pericarditis, unspecified: Secondary | ICD-10-CM

## 2013-03-31 DIAGNOSIS — I480 Paroxysmal atrial fibrillation: Secondary | ICD-10-CM

## 2013-03-31 LAB — COMPREHENSIVE METABOLIC PANEL
ALT: 27 U/L (ref 0–35)
AST: 20 U/L (ref 0–37)
Albumin: 3.8 g/dL (ref 3.5–5.2)
Alkaline Phosphatase: 79 U/L (ref 39–117)
BUN: 8 mg/dL (ref 6–23)
CO2: 25 mEq/L (ref 19–32)
Calcium: 9.3 mg/dL (ref 8.4–10.5)
Chloride: 102 mEq/L (ref 96–112)
Creat: 0.75 mg/dL (ref 0.50–1.10)
Glucose, Bld: 73 mg/dL (ref 70–99)
Potassium: 4.1 mEq/L (ref 3.5–5.3)
Sodium: 138 mEq/L (ref 135–145)
Total Bilirubin: 0.4 mg/dL (ref 0.3–1.2)
Total Protein: 7.8 g/dL (ref 6.0–8.3)

## 2013-03-31 NOTE — Patient Instructions (Addendum)
Continue prednisone at current dose-Dr. Corliss Skains will instruct.  Mussinex is fine for congestion, but no pseudophed.   OK for return to work.  Follow up with Dr. Royann Shivers in 2 months.  Will have you check labs.

## 2013-04-01 LAB — SEDIMENTATION RATE: Sed Rate: 13 mm/hr (ref 0–22)

## 2013-04-02 ENCOUNTER — Ambulatory Visit: Payer: BC Managed Care – PPO | Admitting: Hematology & Oncology

## 2013-04-02 ENCOUNTER — Encounter: Payer: Self-pay | Admitting: Cardiology

## 2013-04-02 ENCOUNTER — Other Ambulatory Visit: Payer: BC Managed Care – PPO | Admitting: Lab

## 2013-04-02 ENCOUNTER — Encounter: Payer: Self-pay | Admitting: *Deleted

## 2013-04-02 ENCOUNTER — Other Ambulatory Visit (HOSPITAL_BASED_OUTPATIENT_CLINIC_OR_DEPARTMENT_OTHER): Payer: BC Managed Care – PPO

## 2013-04-02 DIAGNOSIS — I480 Paroxysmal atrial fibrillation: Secondary | ICD-10-CM | POA: Insufficient documentation

## 2013-04-02 DIAGNOSIS — Z7901 Long term (current) use of anticoagulants: Secondary | ICD-10-CM

## 2013-04-02 DIAGNOSIS — D609 Acquired pure red cell aplasia, unspecified: Secondary | ICD-10-CM

## 2013-04-02 DIAGNOSIS — D473 Essential (hemorrhagic) thrombocythemia: Secondary | ICD-10-CM

## 2013-04-02 DIAGNOSIS — D75839 Thrombocytosis, unspecified: Secondary | ICD-10-CM

## 2013-04-02 HISTORY — DX: Paroxysmal atrial fibrillation: I48.0

## 2013-04-02 HISTORY — DX: Long term (current) use of anticoagulants: Z79.01

## 2013-04-02 LAB — CBC WITH DIFFERENTIAL (CANCER CENTER ONLY)
BASO#: 0.1 10*3/uL (ref 0.0–0.2)
BASO%: 0.6 % (ref 0.0–2.0)
EOS%: 5.7 % (ref 0.0–7.0)
Eosinophils Absolute: 0.5 10*3/uL (ref 0.0–0.5)
HCT: 40.9 % (ref 34.8–46.6)
HGB: 13.8 g/dL (ref 11.6–15.9)
LYMPH#: 2 10*3/uL (ref 0.9–3.3)
LYMPH%: 22.6 % (ref 14.0–48.0)
MCH: 33.3 pg (ref 26.0–34.0)
MCHC: 33.7 g/dL (ref 32.0–36.0)
MCV: 99 fL (ref 81–101)
MONO#: 1 10*3/uL — ABNORMAL HIGH (ref 0.1–0.9)
MONO%: 11.5 % (ref 0.0–13.0)
NEUT#: 5.3 10*3/uL (ref 1.5–6.5)
NEUT%: 59.6 % (ref 39.6–80.0)
Platelets: 346 10*3/uL (ref 145–400)
RBC: 4.14 10*6/uL (ref 3.70–5.32)
RDW: 12.7 % (ref 11.1–15.7)
WBC: 8.8 10*3/uL (ref 3.9–10.0)

## 2013-04-02 LAB — FERRITIN: Ferritin: 398 ng/mL — ABNORMAL HIGH (ref 10–291)

## 2013-04-02 LAB — RETICULOCYTES (CHCC)
ABS Retic: 54.9 10*3/uL (ref 19.0–186.0)
RBC.: 4.22 MIL/uL (ref 3.87–5.11)
Retic Ct Pct: 1.3 % (ref 0.4–2.3)

## 2013-04-02 LAB — IRON AND TIBC
%SAT: 37 % (ref 20–55)
Iron: 88 ug/dL (ref 42–145)
TIBC: 241 ug/dL — ABNORMAL LOW (ref 250–470)
UIBC: 153 ug/dL (ref 125–400)

## 2013-04-02 LAB — CHCC SATELLITE - SMEAR

## 2013-04-02 NOTE — Assessment & Plan Note (Addendum)
No further symptoms, weaning steroid, will stay at current does until seen by rheumatology. Recheck sed rate.  Ok to return to work on 04/06/13.

## 2013-04-02 NOTE — Assessment & Plan Note (Signed)
No further episodes

## 2013-04-02 NOTE — Assessment & Plan Note (Signed)
stable °

## 2013-04-02 NOTE — Progress Notes (Signed)
04/02/2013   PCP: Gwynneth Aliment, MD   Chief Complaint  Patient presents with  . ROV 3 weeks    Dizziness    Primary Cardiologist: Dr. Royann Shivers  HPI:  52 year old African American female with recent history of acute myopericarditis and atrial fibrillation. She has collagen vascular disease with a variety of manifestations over several years. She was admitted to [facility] in April of this year with pleuritic chest pain associated with atrial fib with RVR. Cardiac cath was done and did not reveal any evidence of coronary artery disease but cardiac enzymes were elevated and a waxing and waning pattern. There were several ST segment elevation in several EKG leads and the appearance of acute myopericarditis.  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 The hospitalization her sed rate was 72.  She has improved with colchicine and steroids and followup echo revealed that her heart function was completely back to normal and she has been decreasing her prednisone to current dose which she will remain on until she sees her rheumatologist.  She is on Xarelto for anticoagulation with her atrial fibrillation. She occasionally will  have fluttery Beats.  Today she has no complaints and would like to return to work at [employer].  Her EF is 60-65%.  She has no more vaginal bleeding at this time.  No Known Allergies  Current Outpatient Prescriptions  Medication Sig Dispense Refill  . acetaminophen (TYLENOL) 325 MG tablet Take 2 tablets (650 mg total) by mouth every 4 (four) hours as needed for pain or fever.      . colchicine 0.6 MG tablet Take 1 tablet (0.6 mg total) by mouth 2 (two) times daily.  60 tablet  5  . folic acid (FOLVITE) 1 MG tablet Take 1 mg by mouth daily.      Marland Kitchen ibuprofen (ADVIL,MOTRIN) 200 MG tablet Take 400 mg by mouth every 6  (six) hours as needed for pain. For pain      . metoprolol tartrate (LOPRESSOR) 25 MG tablet Take 1 tablet (25 mg total) by mouth 2 (two) times daily.  60 tablet  5  . Multiple Vitamin (MULTIVITAMIN WITH MINERALS) TABS Take 1 tablet by mouth daily.      . predniSONE (DELTASONE) 5 MG tablet Take 2.5 mg by mouth daily.      . Rivaroxaban (XARELTO) 20 MG TABS Take 1 tablet (20 mg total) by mouth daily.  30 tablet  5  . Vitamin D, Ergocalciferol, (DRISDOL) 50000 UNITS CAPS Take 50,000 Units by mouth 2 (two) times a week. tueday and friday       No current facility-administered medications for this visit.    Past Medical History  Diagnosis Date  . 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 Lupus 09/14/2011  . Hypertension   . Diverticulitis   . Atrial fibrillation   . Chronic anticoagulation, Xarelto 04/02/2013    No past surgical history on file.  AVW:UJWJXBJ:YN colds or fevers, no weight changes Skin:no rashes or ulcers HEENT:no blurred vision, no congestion CV:see HPI PUL:see HPI GI:no diarrhea constipation or melena, no indigestion GU:no hematuria, no dysuria MS:no joint pain, no claudication Neuro:no syncope, no lightheadedness Endo:no diabetes, no thyroid disease  PHYSICAL EXAM BP 130/82  Pulse 76  Ht 5\' 11"  (1.803 m)  Wt 150 lb 12.8 oz (68.402 kg)  BMI 21.04 kg/m2 General:Pleasant affect, NAD  Skin:Warm and dry, brisk capillary refill HEENT:normocephalic, sclera clear, mucus membranes moist Neck:supple, no JVD, no bruits  Heart:S1S2 RRR without murmur, gallup, rub or click Lungs:clear without rales, rhonchi, or wheezes WUJ:WJXB, non tender, + BS, do not palpate liver spleen or masses Ext:no lower ext edema, 2+ pedal pulses, 2+ radial pulsessubtle sclerodactyly is present on the distal phalanges of several digits  Neuro:alert and oriented, MAE, follows commands, + facial symmetry    ASSESSMENT AND PLAN Acute myopericarditis No further symptoms,  weaning steroid, will stay at current does until seen by rheumatology. Recheck sed rate.  Ok to return to work on 04/06/13.  PAF (paroxysmal atrial fibrillation) No further episodes  Chronic anticoagulation, Xarelto stable  Lupus Followed by Dr. Corliss Skains.   She will follow up with Dr. Royann Shivers in 2 months, to continue prednisone at current dose until she sees Dr. Corliss Skains. For congestion Mucinex is fine but no pseudoephedrine- which could cause tachycardia.

## 2013-04-02 NOTE — Assessment & Plan Note (Signed)
Followed by Dr Deveshwar 

## 2013-04-07 ENCOUNTER — Telehealth: Payer: Self-pay | Admitting: Oncology

## 2013-04-07 NOTE — Telephone Encounter (Signed)
Message copied by Lacie Draft on Tue Apr 07, 2013  5:12 PM ------      Message from: Josph Macho      Created: Fri Apr 03, 2013  7:04 AM       Please call and let her know that her blood and iron studies are much better. Thanks. Pete ------

## 2013-04-08 ENCOUNTER — Telehealth: Payer: Self-pay | Admitting: *Deleted

## 2013-04-08 NOTE — Telephone Encounter (Signed)
Called patient  To let her know that her blood and iron studies are much better per dr. Lupita Leash

## 2013-04-14 ENCOUNTER — Telehealth: Payer: Self-pay | Admitting: Internal Medicine

## 2013-04-14 DIAGNOSIS — J849 Interstitial pulmonary disease, unspecified: Secondary | ICD-10-CM

## 2013-04-14 NOTE — Telephone Encounter (Signed)
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 DR deveshwar says exam shows crackles but she is asymptomatch from chest point  Please get PFt and CT Chest before 04/24/13 so I can share this with Dr Carmelina Paddock before patient sees her on 04/28/13 and will be helpful for my visit   Dr. Kalman Shan, M.D., [facility].C.P Pulmonary and Critical Care Medicine Staff Physician [facility] [place] [facility] Pager: [phone], If no answer or between  15:00h - 7:00h: call [phone]  04/14/2013 5:59 PM

## 2013-04-15 ENCOUNTER — Telehealth: Payer: Self-pay | Admitting: Hematology & Oncology

## 2013-04-15 NOTE — Telephone Encounter (Signed)
LMOMTCB - PFT scheduled at [facility] 04-17-13 at 11:30.  Check in at 1st floor admitting at 11:15.  No inhalers, caffeine or cigs 4-6 hrs prior. Bjorn Loser is working on scheduling CT.

## 2013-04-15 NOTE — Telephone Encounter (Signed)
Faxed Medical Records via fax todayto: [facility]       [address] [place], [place]  [address]       Ph: [phone]       Fx: [phone]   Medical  Records requested complete   CONSENT COPY SCANNED

## 2013-04-17 ENCOUNTER — Inpatient Hospital Stay (HOSPITAL_COMMUNITY): Admission: RE | Admit: 2013-04-17 | Payer: BC Managed Care – PPO | Source: Ambulatory Visit

## 2013-04-20 ENCOUNTER — Inpatient Hospital Stay: Admission: RE | Admit: 2013-04-20 | Payer: BC Managed Care – PPO | Source: Ambulatory Visit

## 2013-04-22 ENCOUNTER — Telehealth: Payer: Self-pay | Admitting: Internal Medicine

## 2013-04-22 NOTE — Telephone Encounter (Signed)
Will forward to Dr. Ramaswamy as an FYI 

## 2013-04-24 ENCOUNTER — Encounter: Payer: Self-pay | Admitting: *Deleted

## 2013-04-24 NOTE — Telephone Encounter (Signed)
Letter sent. Hadessah Grennan, CMA  

## 2013-04-24 NOTE — Telephone Encounter (Signed)
Send registered letter  Dr. Kalman Shan, M.D., [facility].C.P Pulmonary and Critical Care Medicine Staff Physician [facility] [place] [facility] Pager: [phone], If no answer or between  15:00h - 7:00h: call [phone]  04/24/2013 5:01 AM

## 2013-05-07 ENCOUNTER — Ambulatory Visit (INDEPENDENT_AMBULATORY_CARE_PROVIDER_SITE_OTHER): Payer: BC Managed Care – PPO | Admitting: Internal Medicine

## 2013-05-07 ENCOUNTER — Encounter: Payer: Self-pay | Admitting: Internal Medicine

## 2013-05-07 VITALS — BP 122/80 | HR 74 | Temp 97.8°F | Ht 71.5 in | Wt 150.0 lb

## 2013-05-07 DIAGNOSIS — J8410 Pulmonary fibrosis, unspecified: Secondary | ICD-10-CM

## 2013-05-07 DIAGNOSIS — J849 Interstitial pulmonary disease, unspecified: Secondary | ICD-10-CM

## 2013-05-07 NOTE — Progress Notes (Signed)
Subjective:    Patient ID: Carolyn Proctor, female    DOB: [birth year], 52 y.o.   MRN: [id number] PCP Gwynneth Aliment, MD Rehumatology - Dr Pollyann Savoy Cardiology - DR Judie Petit Croituru   HPI  51 year old with rheumatoid arthritis  RA    - per her hx x diagfnosed 18 years ago in [place]  -  Per hx invlves hands, feet, knees  - Only has early morning stiffness but no pain. Works in [employer] as Production designer, theatre/television/film and types a lot; does not find job to be a handicap  - RX: currently none for RA specific. Per Dr D notes: > 1 year ago pred/plaq recommended but patoient did not take  - she feels joints are overall stable x 18 years though there are some waxing and waning features but she is able to manage. HOwever, Dr Corliss Skains notes 04/13/13  that  Patient has severe erosive changes  In hands and feet and on exam has contraactures and synovitis. 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 Had short course prednisone March 2011 for 3 months  - Myocarditis  - May 2014. Rx prednisone daily with colchicine. FU echo June 2014 - normal. Currently on prednisone for this   Pulmonary   - referred here 05/07/2013 t o look for pulmonary evaluation because Dr Corliss Skains heard crackles on physical exam. Patient denies respiratory complaints. Denies dyspnea, cough, chest tightness or wheeze. She says she walks 3 d/week x 30 min x slow walk and does not get dyspneic. Does regular stretches and has no dyspnea. WE ordered PFT and CT chest prior to OV btu she no showed (Says did not know aobut test)   reports that she has never smoked. She has never used smokeless tobacco.     Imaging data - 12/17/2002 - CT chest - only report: ILD with honeycombing. No image available - 07/17/2007 - CT chest - UIP pattern per  My opinion - 09/03/2009 - CT chest - UIP pattern per my opinion - no temporal change since 2008 in my opinion  Past Medical History  Diagnosis Date  . Red cell  aplasia 09/14/2011  . 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 Lupus 09/14/2011  . Hypertension   . Diverticulitis   . Atrial fibrillation   . Chronic anticoagulation, Xarelto 04/02/2013     Family History  Problem Relation Age of Onset  . Lupus Mother   . Liver disease Mother   . Lupus Brother   . Hypertension Maternal Grandmother      History   Social History  . Marital Status: Divorced    Spouse Name: N/A    Number of Children: N/A  . Years of Education: N/A   Occupational History  . Not on file.   Social History Main Topics  . Smoking status: Never Smoker   . Smokeless tobacco: Never Used  . Alcohol Use: No  . Drug Use: No  . Sexually Active: No   Other Topics Concern  . Not on file   Social History Narrative  . No narrative on file     No Known Allergies   Outpatient Prescriptions Prior to Visit  Medication Sig Dispense Refill  . acetaminophen (TYLENOL) 325 MG tablet Take 2 tablets (650 mg total) by mouth every 4 (four) hours as needed for pain or fever.      . colchicine 0.6 MG tablet Take 1 tablet (0.6 mg total) by  mouth 2 (two) times daily.  60 tablet  5  . folic acid (FOLVITE) 1 MG tablet Take 1 mg by mouth daily.      Marland Kitchen ibuprofen (ADVIL,MOTRIN) 200 MG tablet Take 400 mg by mouth every 6 (six) hours as needed for pain. For pain      . metoprolol tartrate (LOPRESSOR) 25 MG tablet Take 1 tablet (25 mg total) by mouth 2 (two) times daily.  60 tablet  5  . Multiple Vitamin (MULTIVITAMIN WITH MINERALS) TABS Take 1 tablet by mouth daily.      . predniSONE (DELTASONE) 5 MG tablet Take 2.5 mg by mouth daily.      . Rivaroxaban (XARELTO) 20 MG TABS Take 1 tablet (20 mg total) by mouth daily.  30 tablet  5  . Vitamin D, Ergocalciferol, (DRISDOL) 50000 UNITS CAPS Take 50,000 Units by mouth 2 (two) times a week. tueday and friday       No facility-administered medications prior to visit.      Review of Systems  Constitutional: Negative for fever and unexpected weight  change.  HENT: Negative for ear pain, nosebleeds, congestion, sore throat, rhinorrhea, sneezing, trouble swallowing, dental problem, postnasal drip and sinus pressure.   Eyes: Negative for redness and itching.  Respiratory: Negative for cough, chest tightness, shortness of breath and wheezing.   Cardiovascular: Negative for palpitations and leg swelling.  Gastrointestinal: Negative for nausea and vomiting.  Genitourinary: Negative for dysuria.  Musculoskeletal: Positive for joint swelling and arthralgias.  Skin: Negative for rash.  Neurological: Negative for headaches.  Hematological: Does not bruise/bleed easily.  Psychiatric/Behavioral: Negative for dysphoric mood. The patient is not nervous/anxious.        Objective:   Physical Exam  Vitals reviewed. Constitutional: She is oriented to person, place, and time. She appears well-developed and well-nourished. No distress.  Tall Body mass index is 20.63 kg/(m^2).   HENT:  Head: Normocephalic and atraumatic.  Right Ear: External ear normal.  Left Ear: External ear normal.  Mouth/Throat: Oropharynx is clear and moist. No oropharyngeal exudate.  Hoarse voice ? Arytenoid involvement with RA  Eyes: Conjunctivae and EOM are normal. Pupils are equal, round, and reactive to light. Right eye exhibits no discharge. Left eye exhibits no discharge. No scleral icterus.  Neck: Normal range of motion. Neck supple. No JVD present. No tracheal deviation present. No thyromegaly present.  Cardiovascular: Normal rate, regular rhythm, normal heart sounds and intact distal pulses.  Exam reveals no gallop and no friction rub.   No murmur heard. Pulmonary/Chest: Effort normal. No respiratory distress. She has no wheezes. She has rales. She exhibits no tenderness.  Crackles at right base  Abdominal: Soft. Bowel sounds are normal. She exhibits no distension and no mass. There is no tenderness. There is no rebound and no guarding.  Musculoskeletal: Normal  range of motion. She exhibits no edema and no tenderness.  classi RA features in hand bilaterally  Lymphadenopathy:    She has no cervical adenopathy.  Neurological: She is alert and oriented to person, place, and time. She has normal reflexes. No cranial nerve deficit. She exhibits normal muscle tone. Coordination normal.  Skin: Skin is warm and dry. No rash noted. She is not diaphoretic. No erythema. No pallor.  Psychiatric: She has a normal mood and affect. Her behavior is normal. Judgment and thought content normal.          Assessment & Plan:

## 2013-05-07 NOTE — Patient Instructions (Addendum)
You have RA involving the lungs atleast since 2004 to the best of my knowledge Glad you are asymptomatic from lung stand point DO not know if it has progressed since 2010 HAve full PFT and CT chest asap; will call with results to decide followup

## 2013-05-07 NOTE — Assessment & Plan Note (Signed)
She is assymptomatic but has had CT findings of UIP described in 2004. Certainly no temporal change between 2008 and 2010. 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   Recommend PFT and CT chest to assess severity and progresion that can help formulate Rx plan

## 2013-05-13 ENCOUNTER — Telehealth: Payer: Self-pay | Admitting: Internal Medicine

## 2013-05-13 ENCOUNTER — Ambulatory Visit (INDEPENDENT_AMBULATORY_CARE_PROVIDER_SITE_OTHER)
Admission: RE | Admit: 2013-05-13 | Discharge: 2013-05-13 | Disposition: A | Discharge status: Home / Self Care | Payer: BC Managed Care – PPO | Source: Ambulatory Visit | Attending: Internal Medicine | Admitting: Internal Medicine

## 2013-05-13 DIAGNOSIS — J8410 Pulmonary fibrosis, unspecified: Secondary | ICD-10-CM

## 2013-05-13 DIAGNOSIS — J849 Interstitial pulmonary disease, unspecified: Secondary | ICD-10-CM

## 2013-05-13 NOTE — Telephone Encounter (Signed)
Actually go ahead and give her first avail fu after ct and pft to see me  Dr. Kalman Shan, M.D., [facility].C.P Pulmonary and Critical Care Medicine Staff Physician [facility] Pager: [phone], If no answer or between  15:00h - 7:00h: call [phone]  05/13/2013 1:38 AM  '

## 2013-05-15 NOTE — Telephone Encounter (Signed)
LMTCBx1.Chastin Riesgo, CMA  

## 2013-05-18 NOTE — Telephone Encounter (Signed)
Pt returned call and can be reached @ [phone] or her cell which is [phone]. Leanora Ivanoff

## 2013-05-18 NOTE — Telephone Encounter (Signed)
Returning call can be reached at [phone].Carolyn Proctor

## 2013-05-18 NOTE — Telephone Encounter (Signed)
Pt is aware of CT results. PFT had already been scheduled for 06/03/13. ROV has been scheduled for 06/17/2013 at 4:15p.

## 2013-05-18 NOTE — Telephone Encounter (Addendum)
LMTCBx2 to schedule appt. Pt also has CT results that she needs to be advised of. Carron Curie, CMA  Notes Recorded by Kalman Shan, MD on 05/17/2013 at 9:19 PM persisent but unchanged ILD on CT over the years since 2008. 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 Please have her do PFT and have her come in to see me; first avaialable

## 2013-05-18 NOTE — Telephone Encounter (Signed)
LMTCB x1 for pt.  

## 2013-06-02 ENCOUNTER — Ambulatory Visit (INDEPENDENT_AMBULATORY_CARE_PROVIDER_SITE_OTHER): Payer: BC Managed Care – PPO | Admitting: Cardiovascular Disease

## 2013-06-02 ENCOUNTER — Encounter: Payer: Self-pay | Admitting: Cardiovascular Disease

## 2013-06-02 VITALS — BP 136/72 | HR 72 | Ht 72.0 in | Wt 150.2 lb

## 2013-06-02 DIAGNOSIS — I4891 Unspecified atrial fibrillation: Secondary | ICD-10-CM

## 2013-06-02 DIAGNOSIS — I309 Acute pericarditis, unspecified: Secondary | ICD-10-CM

## 2013-06-02 DIAGNOSIS — I480 Paroxysmal atrial fibrillation: Secondary | ICD-10-CM

## 2013-06-02 NOTE — Patient Instructions (Addendum)
Your physician recommends that you schedule a follow-up appointment in: 12 months.  

## 2013-06-08 NOTE — Progress Notes (Signed)
Patient ID: Carolyn Proctor, female   DOB: 1960-11-23, 52 y.o.   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 She appears to have recovered completely from an episode of acute myopericarditis. Her rheumatological workup does not suggest likely association with myopericarditis. Has seen Dr.Deveshwar.  No Known Allergies  Current Outpatient Prescriptions  Medication Sig Dispense Refill  . acetaminophen (TYLENOL) 325 MG tablet Take 2 tablets (650 mg total) by mouth every 4 (four) hours as needed for pain or fever.      Marland Kitchen azaTHIOprine (IMURAN) 50 MG tablet Take 50 mg by mouth daily.      . cholecalciferol (VITAMIN D) 1000 UNITS tablet Take 1,000 Units by mouth daily.      . colchicine 0.6 MG tablet Take 1 tablet (0.6 mg total) by mouth 2 (two) times daily.  60 tablet  5  . folic acid (FOLVITE) 1 MG tablet Take 1 mg by mouth daily.      Marland Kitchen ibuprofen (ADVIL,MOTRIN) 200 MG tablet Take 400 mg by mouth every 6 (six) hours as needed for pain. For pain      . metoprolol tartrate (LOPRESSOR) 25 MG tablet Take 1 tablet (25 mg total) by mouth 2 (two) times daily.  60 tablet  5  . Multiple Vitamin (MULTIVITAMIN WITH MINERALS) TABS Take 1 tablet by mouth daily.      . predniSONE (DELTASONE) 5 MG tablet Take 10 mg by mouth daily.       . Rivaroxaban (XARELTO) 20 MG TABS Take 1 tablet (20 mg total) by mouth daily.  30 tablet  5   No current facility-administered medications for this visit.    Past Medical History  Diagnosis Date  . 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 Lupus 09/14/2011  . Hypertension   . Diverticulitis   .  Atrial fibrillation   . Chronic anticoagulation, Xarelto 04/02/2013    Past Surgical History  Procedure Laterality Date  . Cardiac catheterization    . Bone marrow biopsy  2010    Family History  Problem Relation Age of Onset  . Lupus Mother   . Liver Proctor Mother   . Lupus Brother   . Hypertension Maternal Grandmother     History   Social History  . Marital Status: Divorced    Spouse Name: N/A    Number of Children: N/A  . Years of Education: N/A   Occupational History  . Not on file.   Social History Main Topics  . Smoking status: Never Smoker   . Smokeless tobacco: Never Used  . Alcohol Use: No  . Drug Use: No  . Sexual Activity: No   Other Topics Concern  . Not on file   Social History Narrative  . No narrative on file    Review of systems: The patient specifically denies any chest pain at rest or with exertion, dyspnea at rest or with exertion, orthopnea, paroxysmal nocturnal dyspnea, syncope, palpitations, focal neurological deficits, intermittent claudication, lower extremity edema, unexplained weight gain, cough, hemoptysis or wheezing.  The patient also denies abdominal pain, nausea, vomiting, dysphagia, diarrhea, constipation, polyuria, polydipsia, dysuria, hematuria, frequency, urgency, abnormal bleeding or bruising, fever, chills, unexpected weight changes,  mood swings, change in skin or hair texture, change in voice quality, auditory or visual problems, allergic reactions or rashes, new musculoskeletal complaints other than usual "aches and pains".   PHYSICAL EXAM BP 136/72  Pulse 72  Ht 6' (1.829 m)  Wt 150 lb 3.2 oz (68.13 kg)  BMI 20.37 kg/m2  LMP 02/19/2013  General: Alert, oriented x3, no distress Head: no evidence of trauma, PERRL, EOMI, no exophtalmos or lid lag, no myxedema, no xanthelasma; normal ears, nose and oropharynx, some degree of "pouching" of the mouth Neck: normal jugular venous pulsations and no hepatojugular reflux; brisk  carotid pulses without delay and no carotid bruits Chest: clear to auscultation, no signs of consolidation by percussion or palpation, normal fremitus, symmetrical and full respiratory excursions Cardiovascular: normal position and quality of the apical impulse, regular rhythm, normal first and second heart sounds, no murmurs, rubs or gallops Abdomen: no tenderness or distention, no masses by palpation, no abnormal pulsatility or arterial bruits, normal bowel sounds, no hepatosplenomegaly Extremities: She has sclerodactyly, but no clubbing, cyanosis or edema; 2+ radial, ulnar and brachial pulses bilaterally; 2+ right femoral, posterior tibial and dorsalis pedis pulses; 2+ left femoral, posterior tibial and dorsalis pedis pulses; no subclavian or femoral bruits Neurological: grossly nonfocal   EKG: NSR  Lipid Panel     Component Value Date/Time   CHOL  Value: 137        ATP III CLASSIFICATION:  <200     mg/dL   Desirable  409-811  mg/dL   Borderline High  >=914    mg/dL   High 78/29/5621 3086   TRIG 48 07/18/2007 0305   HDL 39* 07/18/2007 0305   CHOLHDL 3.5 07/18/2007 0305   VLDL 10 07/18/2007 0305   LDLCALC  Value: 88        Total Cholesterol/HDL:CHD Risk Coronary Heart Proctor Risk Table                     Men   Women  1/2 Average Risk   3.4   3.3 07/18/2007 0305    BMET    Component Value Date/Time   NA 138 03/31/2013 1536   K 4.1 03/31/2013 1536   CL 102 03/31/2013 1536   CO2 25 03/31/2013 1536   GLUCOSE 73 03/31/2013 1536   BUN 8 03/31/2013 1536   CREATININE 0.75 03/31/2013 1536   CREATININE 0.73 02/24/2013 0842   CALCIUM 9.3 03/31/2013 1536   GFRNONAA >90 02/24/2013 0842   GFRAA >90 02/24/2013 0842     ASSESSMENT AND PLAN Acute myopericarditis She seems to have recovered completely from her episode of acute myopericarditis associated with atrial arrhythmia and abnormal cardiac enzymes. She is asymptomatic and her ejection fraction has normalized. She did have evidence of  inflammation on biochemical testing, she does have underlying rheumatological Proctor, but it is hard to say whether she had plain a viral acute myocarditis versus an immune syndrome. She does not fit into a typical pattern of rheumatological Proctor that would be associated with myopericarditis.  PAF (paroxysmal atrial fibrillation) No new episodes have occurred. For the time being I think it is safer that she remain on full dose anticoagulation. Although the arrhythmia may have been exacerbated by the episode of acute myopericarditis, she has had trouble arrhythmia before this last episode. Would like to see a long period free of arrhythmia before we discontinue anticoagulation..   Meds ordered this encounter  Medications  . cholecalciferol (VITAMIN D) 1000 UNITS tablet  Sig: Take 1,000 Units by mouth daily.  Marland Kitchen azaTHIOprine (IMURAN) 50 MG tablet    Sig: Take 50 mg by mouth daily.    Junious Silk, MD, [facility] [phone] office [phone] pager

## 2013-06-08 NOTE — Assessment & Plan Note (Addendum)
No new episodes have occurred. For the time being I think it is safer that she remain on full dose anticoagulation. Although the arrhythmia may have been exacerbated by the episode of acute myopericarditis, she has had trouble arrhythmia before this last episode. Would like to see a long period free of arrhythmia before we discontinue anticoagulation.Marland Kitchen

## 2013-06-08 NOTE — Assessment & Plan Note (Signed)
She seems to have recovered completely from her episode of acute myopericarditis associated with atrial arrhythmia and abnormal cardiac enzymes. She is asymptomatic and her ejection fraction has normalized. She did have evidence of inflammation on biochemical testing, she does have underlying rheumatological disease, but it is hard to say whether she had plain a viral acute myocarditis versus an immune syndrome. She does not fit into a typical pattern of rheumatological disease that would be associated with myopericarditis.

## 2013-06-11 ENCOUNTER — Ambulatory Visit (HOSPITAL_COMMUNITY)
Admission: RE | Admit: 2013-06-11 | Discharge: 2013-06-11 | Disposition: A | Discharge status: Home / Self Care | Payer: BC Managed Care – PPO | Source: Ambulatory Visit | Attending: Internal Medicine | Admitting: Internal Medicine

## 2013-06-11 DIAGNOSIS — J849 Interstitial pulmonary disease, unspecified: Secondary | ICD-10-CM

## 2013-06-11 DIAGNOSIS — J8410 Pulmonary fibrosis, unspecified: Secondary | ICD-10-CM | POA: Insufficient documentation

## 2013-06-11 LAB — PULMONARY FUNCTION TEST

## 2013-06-11 MED ORDER — ALBUTEROL SULFATE (5 MG/ML) 0.5% IN NEBU
2.5000 mg | INHALATION_SOLUTION | Freq: Once | RESPIRATORY_TRACT | Status: AC
Start: 2013-06-11 — End: 2013-06-11
  Administered 2013-06-11: 2.5 mg via RESPIRATORY_TRACT

## 2013-06-17 ENCOUNTER — Ambulatory Visit (INDEPENDENT_AMBULATORY_CARE_PROVIDER_SITE_OTHER): Payer: BC Managed Care – PPO | Admitting: Internal Medicine

## 2013-06-17 ENCOUNTER — Encounter: Payer: Self-pay | Admitting: Internal Medicine

## 2013-06-17 VITALS — BP 140/90 | HR 80 | Temp 97.8°F | Ht 71.0 in | Wt 148.4 lb

## 2013-06-17 DIAGNOSIS — J849 Interstitial pulmonary disease, unspecified: Secondary | ICD-10-CM

## 2013-06-17 DIAGNOSIS — J8410 Pulmonary fibrosis, unspecified: Secondary | ICD-10-CM

## 2013-06-17 NOTE — Progress Notes (Signed)
Subjective:    Patient ID: Ronal Fear, female    DOB: 1961-05-26, 52 y.o.   MRN: [id number]  HPI  PCP Gwynneth Aliment, MD Rehumatology - Dr Pollyann Savoy Cardiology - DR Judie Petit Croituru   IOV  05/07/13  52 year old with rheumatoid arthritis  RA    - per her hx x diagfnosed 18 years ago in [place]  -  Per hx invlves hands, feet, knees  - Only has early morning stiffness but no pain. Works in [employer] as Production designer, theatre/television/film and types a lot; does not find job to be a handicap  - RX: currently none for RA specific. Per Dr D notes: > 1 year ago pred/plaq recommended but patoient did not take  - she feels joints are overall stable x 18 years though there are some waxing and waning features but she is able to manage. HOwever, Dr Corliss Skains notes 04/13/13  that  Patient has severe erosive changes  In hands and feet and on exam has contraactures and synovitis. 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 Had short course prednisone March 2011 for 3 months  - Myocarditis  - May 2014. Rx prednisone daily with colchicine. FU echo June 2014 - normal. Currently on prednisone for this   Pulmonary   - referred here 05/07/2013 t o look for pulmonary evaluation because Dr Corliss Skains heard crackles on physical exam. Patient denies respiratory complaints. Denies dyspnea, cough, chest tightness or wheeze. She says she walks 3 d/week x 30 min x slow walk and does not get dyspneic. Does regular stretches and has no dyspnea. WE ordered PFT and CT chest prior to OV btu she no showed (Says did not know aobut test)   reports that she has never smoked. She has never used smokeless tobacco.     Imaging data - 12/17/2002 - CT chest - only report: ILD with honeycombing. No image available - 07/17/2007 - CT chest - UIP pattern per  My opinion (NSIP per Dr Fredirick Lathe radiology) - 09/03/2009 - CT chest - UIP pattern per my opinion - no temporal change since 2008 in my opinion (PEr  DR Fredirick Lathe - agree no change but patter is NSIP)   REC You have RA involving the lungs atleast since 2004 to the best of my knowledge Glad you are asymptomatic from lung stand point DO not know if it has progressed since 2010 HAve full PFT and CT chest asap; will call with results to decide followup  OV 06/17/2013  Followup rheumatoid arthritis interstitial lung disease. She is here for followup for test results. She continues to be asymptomatic from a respiratory standpoint. Main rheumatoid arthritis symptoms are in her joints. She tells me that her rheumatologist is contemplating adding Imuran  Test results   - CT chest: 05/13/13: persisent but unchanged ILD on CT over the years since 2008. Per DR Fredirick Lathe the radiologist she favors NSIP.  - Pulmonary function test 06/11/2013 shows FVC of 2.6 L/50%. FEV1 of 2 L/50%. Ratio 70/96%. Total lung capacity of 4 L/50%. DLCO 14.7/45% [hemoglobin 13.8 g percent on 04/02/2013]. In summary she is moderate restriction total lung capacity and DLCO  - Walking desaturation test on room air. Could not get finger probe to work on her nails even at rest. So not done   Review of Systems  Constitutional: Negative for fever and unexpected weight change.  HENT: Positive for congestion. Negative for ear pain,  nosebleeds, sore throat, rhinorrhea, sneezing, trouble swallowing, dental problem, postnasal drip and sinus pressure.   Eyes: Negative for redness and itching.  Respiratory: Positive for cough. Negative for chest tightness, shortness of breath and wheezing.   Cardiovascular: Negative for palpitations and leg swelling.  Gastrointestinal: Negative for nausea and vomiting.  Genitourinary: Negative for dysuria.  Musculoskeletal: Negative for joint swelling.  Skin: Negative for rash.  Neurological: Negative for headaches.  Hematological: Does not bruise/bleed easily.  Psychiatric/Behavioral: Negative for dysphoric mood. The patient is not nervous/anxious.    Current outpatient prescriptions:acetaminophen (TYLENOL) 325 MG tablet, Take 2 tablets (650 mg total) by mouth every 4 (four) hours as needed for pain or fever., Disp: , Rfl: ;  azaTHIOprine (IMURAN) 50 MG tablet, Take 50 mg by mouth daily., Disp: , Rfl: ;  cholecalciferol (VITAMIN D) 1000 UNITS tablet, Take 1,000 Units by mouth daily., Disp: , Rfl:  colchicine 0.6 MG tablet, Take 1 tablet (0.6 mg total) by mouth 2 (two) times daily., Disp: 60 tablet, Rfl: 5;  folic acid (FOLVITE) 1 MG tablet, Take 1 mg by mouth daily., Disp: , Rfl: ;  ibuprofen (ADVIL,MOTRIN) 200 MG tablet, Take 400 mg by mouth every 6 (six) hours as needed for pain. For pain, Disp: , Rfl: ;  metoprolol tartrate (LOPRESSOR) 25 MG tablet, Take 1 tablet (25 mg total) by mouth 2 (two) times daily., Disp: 60 tablet, Rfl: 5 Multiple Vitamin (MULTIVITAMIN WITH MINERALS) TABS, Take 1 tablet by mouth daily., Disp: , Rfl: ;  predniSONE (DELTASONE) 5 MG tablet, Take 10 mg by mouth daily. , Disp: , Rfl: ;  Rivaroxaban (XARELTO) 20 MG TABS, Take 1 tablet (20 mg total) by mouth daily., Disp: 30 tablet, Rfl: 5       Objective:   Physical Exam   Discussion only visit   Filed Vitals:   06/17/13 1631  BP: 140/90  Pulse: 80  Temp: 97.8 F (36.6 C)  TempSrc: Oral  Height: 5\' 11"  (1.803 m)  Weight: 148 lb 6.4 oz (67.314 kg)  SpO2: 100%        Assessment & Plan:

## 2013-06-17 NOTE — Patient Instructions (Addendum)
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

## 2013-06-24 NOTE — Assessment & Plan Note (Addendum)
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

## 2013-09-16 ENCOUNTER — Other Ambulatory Visit: Payer: Self-pay

## 2013-09-16 DIAGNOSIS — Z1231 Encounter for screening mammogram for malignant neoplasm of breast: Secondary | ICD-10-CM

## 2013-10-13 ENCOUNTER — Ambulatory Visit
Admission: RE | Admit: 2013-10-13 | Discharge: 2013-10-13 | Disposition: A | Discharge status: Home / Self Care | Payer: BC Managed Care – PPO | Source: Ambulatory Visit

## 2013-10-13 DIAGNOSIS — Z1231 Encounter for screening mammogram for malignant neoplasm of breast: Secondary | ICD-10-CM

## 2014-04-06 ENCOUNTER — Telehealth: Payer: Self-pay | Admitting: Internal Medicine

## 2014-04-06 NOTE — Telephone Encounter (Signed)
[name]  Last seen patient Carolyn Proctor in [place] 2014. I ran into her at bank today. Please give her followup aug/sept 2015 and let her know  Thanks  Dr. Brand Males, M.D., [facility].C.P Pulmonary and Critical Care Medicine Staff Physician [place] [facility] Pager: [phone], If no answer or between  15:00h - 7:00h: call [phone]  04/06/2014 5:25 PM

## 2014-04-07 NOTE — Telephone Encounter (Signed)
ATC pt. Unable to leave voicemail. WCB

## 2014-04-15 NOTE — Telephone Encounter (Signed)
LMTCB

## 2014-04-22 NOTE — Telephone Encounter (Signed)
LMTCB X2 FOR PT ON NAMED VM

## 2014-04-27 ENCOUNTER — Encounter: Payer: Self-pay | Admitting: Emergency Medicine

## 2014-04-27 NOTE — Telephone Encounter (Signed)
Lmtcb. Will send letter.   Will sign off.

## 2014-06-01 ENCOUNTER — Ambulatory Visit (INDEPENDENT_AMBULATORY_CARE_PROVIDER_SITE_OTHER): Payer: BC Managed Care – PPO | Admitting: Internal Medicine

## 2014-06-01 ENCOUNTER — Encounter: Payer: Self-pay | Admitting: Internal Medicine

## 2014-06-01 VITALS — BP 124/86 | HR 95 | Ht 72.0 in | Wt 150.0 lb

## 2014-06-01 DIAGNOSIS — J8489 Other specified interstitial pulmonary diseases: Secondary | ICD-10-CM | POA: Insufficient documentation

## 2014-06-01 DIAGNOSIS — M359 Systemic involvement of connective tissue, unspecified: Secondary | ICD-10-CM | POA: Insufficient documentation

## 2014-06-01 DIAGNOSIS — J849 Interstitial pulmonary disease, unspecified: Secondary | ICD-10-CM

## 2014-06-01 DIAGNOSIS — J8410 Pulmonary fibrosis, unspecified: Secondary | ICD-10-CM

## 2014-06-01 NOTE — Patient Instructions (Signed)
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

## 2014-06-01 NOTE — Progress Notes (Signed)
Subjective:    Patient ID: Carolyn Proctor, female    DOB: 09-29-1961, 53 y.o.   MRN: [id number]  HPI    PCP Maximino Greenland, MD Rehumatology - Dr Bo Merino Cardiology - DR Jerilynn Mages Croituru   IOV  05/07/13  53 year old with rheumatoid arthritis  RA    - per her hx x diagfnosed 18 years ago in [place]  -  Per hx invlves hands, feet, knees  - Only has early morning stiffness but no pain. 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 Per Dr D notes: > 1 year ago pred/plaq recommended but patoient did not take  - she feels joints are overall stable x 18 years though there are some waxing and waning features but she is able to manage. HOwever, Dr Estanislado Pandy notes 04/13/13  that  Patient has severe erosive changes  In hands and feet and on exam has contraactures and synovitis. 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 Had short course prednisone March 2011 for 3 months  - Myocarditis  - May 2014. Rx prednisone daily with colchicine. FU echo June 2014 - normal. Currently on prednisone for this   Pulmonary   - referred here 05/07/2013 t o look for pulmonary evaluation because Dr Estanislado Pandy heard crackles on physical exam. Patient denies respiratory complaints. Denies dyspnea, cough, chest tightness or wheeze. She says she walks 3 d/week x 30 min x slow walk and does not get dyspneic. Does regular stretches and has no dyspnea. WE ordered PFT and CT chest prior to OV btu she no showed (Says did not know aobut test)   reports that she has never smoked. She has never used smokeless tobacco.     Imaging data - 12/17/2002 - CT chest - only report: ILD with honeycombing. No image available - 07/17/2007 - CT chest - UIP pattern per  My opinion (NSIP per Dr Rosario Jacks radiology) - 09/03/2009 - CT chest - UIP pattern per my opinion - no temporal change since 2008 in my  opinion (PEr DR Rosario Jacks - agree no change but patter is NSIP)   REC You have RA involving the lungs atleast since 2004 to the best of my knowledge Glad you are asymptomatic from lung stand point DO not know if it has progressed since 2010 HAve full PFT and CT chest asap; will call with results to decide followup  OV 06/17/2013  Followup rheumatoid arthritis interstitial lung disease. She is here for followup for test results. She continues to be asymptomatic from a respiratory standpoint. Main rheumatoid arthritis symptoms are in her joints. She tells me that her rheumatologist is contemplating adding Imuran  Test results   - CT chest: 05/13/13: persisent but unchanged ILD on CT over the years since 2008. Per DR Rosario Jacks the radiologist she favors NSIP.  - Pulmonary function test 06/11/2013 shows FVC of 2.6 L/71%. FEV1 of 2 L/69%. Ratio 70/96%. Total lung capacity of 4 L/66%. DLCO 14.7/45% [hemoglobin 13.8 g percent on 04/02/2013]. In summary she is moderate restriction total lung capacity and DLCO  - Walking desaturation test on room air. Could not get finger probe to work on her nails even at rest. 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 Follow-up    Pt has not had a recent PFT. Pt states she has been exercising and her breathing is doing well. Pt c/o mild dry cough d/t seasonal allergies. Pt  denies SOB and CP/tightness.     Followup rheumatoid arthritis interstitial lung disease; stable NSIP pattern from 2008 to 2014    Been a years since I saw her. 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 She reports being asymptmatic frrom resp standpoint. She has not seen her rheumatologist or cardiologist in > 9 months but plans to soon She is not on prednisone or any immunomodulator forRA. She feels her joints do not hurt. She is running and doing other exercise and feels great she says. Walk test 185 feet x 3 laps on RA:  Was asymptomatic. Fingers cold and at 3r laps 86% but we do not think this is accurate because fingers cold. So we repeated with head probe:    Past, Family, Social reviewed: no change since last visit. Denies ER, urgent care visit or new health problems   Review of Systems  Constitutional: Negative for fever and unexpected weight change.  HENT: Positive for sinus pressure. Negative for congestion, dental problem, ear pain, nosebleeds, postnasal drip, rhinorrhea, sneezing, sore throat and trouble swallowing.   Eyes: Negative for redness and itching.  Respiratory: Positive for cough. Negative for chest tightness, shortness of breath and wheezing.   Cardiovascular: Negative for palpitations and leg swelling.  Gastrointestinal: Negative for nausea and vomiting.  Genitourinary: Negative for dysuria.  Musculoskeletal: Negative for joint swelling.  Skin: Negative for rash.  Neurological: Negative for headaches.  Hematological: Does not bruise/bleed easily.  Psychiatric/Behavioral: Negative for dysphoric mood. The patient is not nervous/anxious.    Current outpatient prescriptions:acetaminophen (TYLENOL) 325 MG tablet, Take 2 tablets (650 mg total) by mouth every 4 (four) hours as needed for pain or fever., Disp: , Rfl: ;  cholecalciferol (VITAMIN D) 1000 UNITS tablet, Take 1,000 Units by mouth  daily., Disp: , Rfl: ;  colchicine 0.6 MG tablet, Take 1 tablet (0.6 mg total) by mouth 2 (two) times daily., Disp: 60 tablet, Rfl: 5 fexofenadine (ALLEGRA) 180 MG tablet, Take 180 mg by mouth daily., Disp: , Rfl: ;  folic acid (FOLVITE) 1 MG tablet, Take 1 mg by mouth daily., Disp: , Rfl: ;  ibuprofen (ADVIL,MOTRIN) 200 MG tablet, Take 400 mg by mouth every 6 (six) hours as needed for pain. For pain, Disp: , Rfl: ;  Multiple Vitamin (MULTIVITAMIN WITH MINERALS) TABS, Take 1 tablet by mouth daily., Disp: , Rfl:  metoprolol tartrate (LOPRESSOR) 25 MG tablet, Take 1 tablet (25 mg total) by mouth 2 (two) times daily., Disp: 60 tablet, Rfl: 5;  predniSONE (DELTASONE) 5 MG tablet, Take 10 mg by mouth daily. , Disp: , Rfl: ;  Rivaroxaban (XARELTO) 20 MG TABS, Take 1 tablet (20 mg total) by mouth daily., Disp: 30 tablet, Rfl:  5     Objective:   Physical Exam  Filed Vitals:   06/01/14 1555  BP: 124/86  Pulse: 95  Height: 6' (1.829 m)  Weight: 150 lb (68.04 kg)  SpO2: 98%    tals reviewed. Constitutional: She is oriented to person, place, and time. She appears well-developed and well-nourished. No distress.  Tall Body mass index is 20.34 kg/(m^2). Marland Kitchen   HENT:  Head: Normocephalic and atraumatic.  Right Ear: External ear normal.  Left Ear: External ear normal.  Mouth/Throat: Oropharynx is clear and moist. No oropharyngeal exudate.  Hoarse voice ? Arytenoid involvement with RA  Eyes: Conjunctivae and EOM are normal. Pupils are equal, round, and reactive to light. Right eye exhibits no discharge. Left eye exhibits no discharge. No scleral icterus.  Neck: Normal range of motion. Neck supple. No JVD present. No tracheal deviation present. No thyromegaly present.  Cardiovascular: Normal rate, regular rhythm, normal heart sounds and intact distal pulses.  Exam reveals no gallop and no friction rub.   No murmur heard. Pulmonary/Chest: Effort normal. No respiratory distress. She has no wheezes. She has  rales. She exhibits no tenderness.  Crackles at right base  Abdominal: Soft. Bowel sounds are normal. She exhibits no distension and no mass. There is no tenderness. There is no rebound and no guarding.  Musculoskeletal: Normal range of motion. She exhibits no edema and no tenderness.  classi RA features in hand bilaterally  Lymphadenopathy:    She has no cervical adenopathy.  Neurological: She is alert and oriented to person, place, and time. She has normal reflexes. No cranial nerve deficit. She exhibits normal muscle tone. Coordination normal.  Skin: Skin is warm and dry. No rash noted. She is not diaphoretic. No erythema. No pallor.  Psychiatric: She has a normal mood and affect. Her behavior is normal. Judgment and thought content normal.         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

## 2014-06-02 NOTE — Assessment & Plan Note (Signed)
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

## 2014-06-11 ENCOUNTER — Ambulatory Visit (INDEPENDENT_AMBULATORY_CARE_PROVIDER_SITE_OTHER): Payer: BC Managed Care – PPO | Admitting: Internal Medicine

## 2014-06-11 DIAGNOSIS — J849 Interstitial pulmonary disease, unspecified: Secondary | ICD-10-CM

## 2014-06-11 DIAGNOSIS — J8410 Pulmonary fibrosis, unspecified: Secondary | ICD-10-CM

## 2014-06-11 LAB — PULMONARY FUNCTION TEST
DL/VA % pred: 71 %
DL/VA: 3.86 ml/min/mmHg/L
DLCO unc % pred: 42 %
DLCO unc: 13.7 ml/min/mmHg
FEF 25-75 Post: 2.35 L/sec
FEF 25-75 Pre: 1.02 L/sec
FEF2575-%Change-Post: 129 %
FEF2575-%Pred-Post: 82 %
FEF2575-%Pred-Pre: 35 %
FEV1-%Change-Post: 15 %
FEV1-%Pred-Post: 75 %
FEV1-%Pred-Pre: 65 %
FEV1-Post: 2.18 L
FEV1-Pre: 1.89 L
FEV1FVC-%Change-Post: 8 %
FEV1FVC-%Pred-Pre: 90 %
FEV6-%Change-Post: 6 %
FEV6-%Pred-Post: 78 %
FEV6-%Pred-Pre: 72 %
FEV6-Post: 2.73 L
FEV6-Pre: 2.55 L
FEV6FVC-%Change-Post: 1 %
FEV6FVC-%Pred-Post: 101 %
FEV6FVC-%Pred-Pre: 100 %
FVC-%Change-Post: 5 %
FVC-%Pred-Post: 76 %
FVC-%Pred-Pre: 72 %
FVC-Post: 2.74 L
FVC-Pre: 2.59 L
Post FEV1/FVC ratio: 80 %
Post FEV6/FVC ratio: 100 %
Pre FEV1/FVC ratio: 73 %
Pre FEV6/FVC Ratio: 99 %
RV % pred: 57 %
RV: 1.22 L
TLC % pred: 65 %
TLC: 3.89 L

## 2014-06-11 NOTE — Progress Notes (Signed)
PFT done today. 

## 2014-09-16 ENCOUNTER — Encounter (HOSPITAL_COMMUNITY): Payer: Self-pay | Admitting: Cardiology

## 2015-02-28 IMAGING — CT CT HEAD W/O CM
1 of 2 series · 13 of 30 positions shown, 17 images · non-contrast
Comparison: Head CT and brain MRI 07/17/2007.

CLINICAL DATA: 51-year-old female with dizziness, numbness.

CT HEAD WITHOUT CONTRAST
TECHNIQUE: Contiguous axial images were obtained from the base of
the skull through the vertex without contrast.

[Series 2: brain · axial · 0.47mm/px · z∈[+126,+259]mm · 13 of 32 slices shown, 17 images]
[im 3/32  brain]
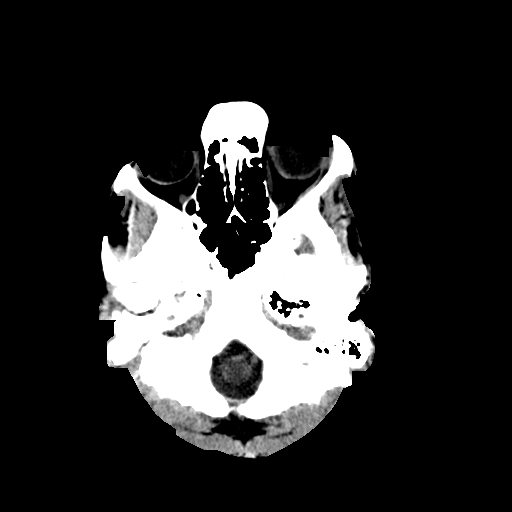
[im 3/32  bone]
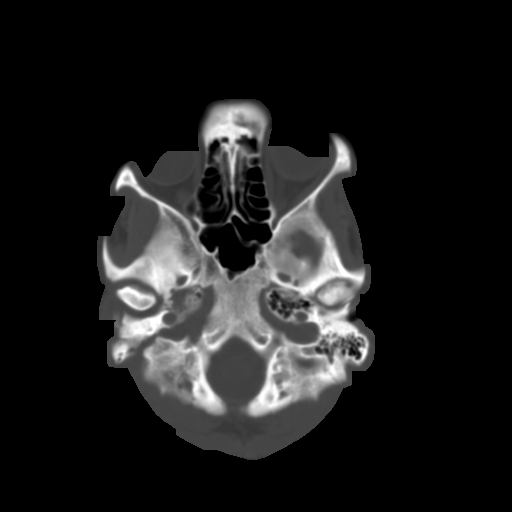
[im 5/32  brain]
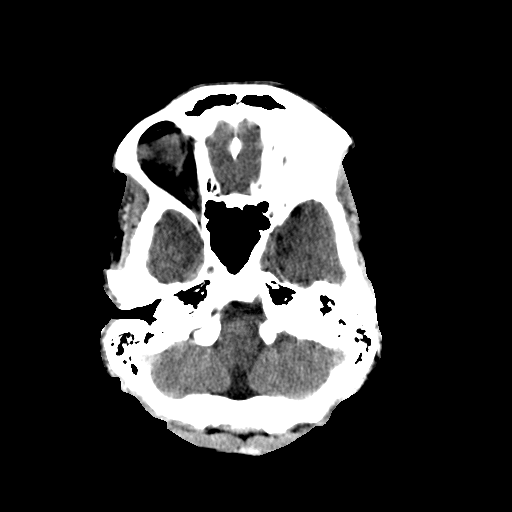
[im 7/32  brain]
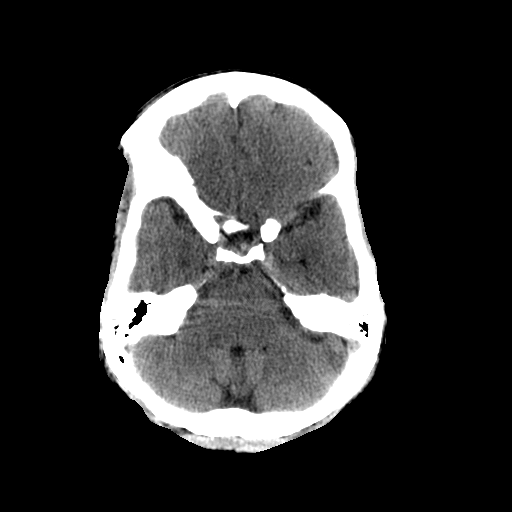
[im 9/32  brain]
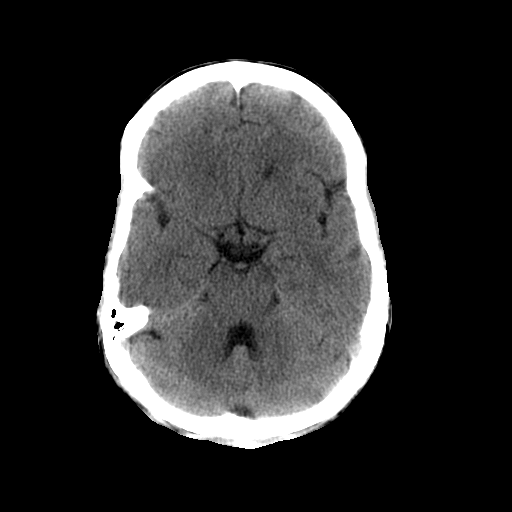
[im 12/32  brain]
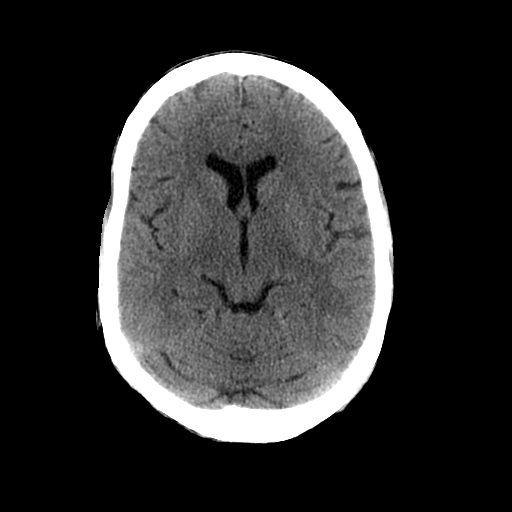
[im 12/32  bone]
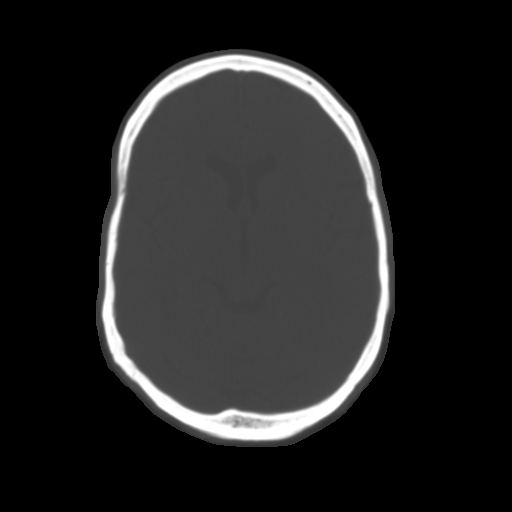
[im 14/32  brain]
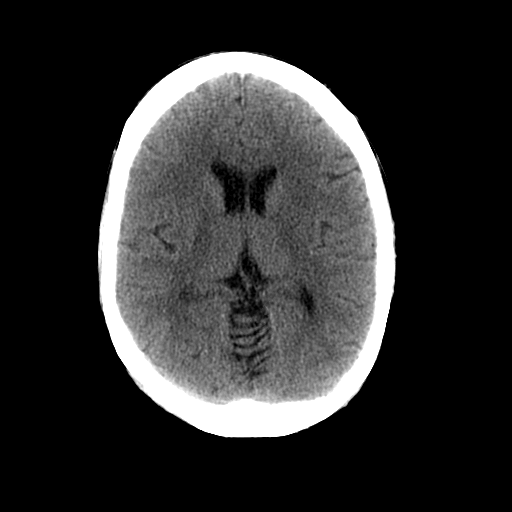
[im 16/32  brain]
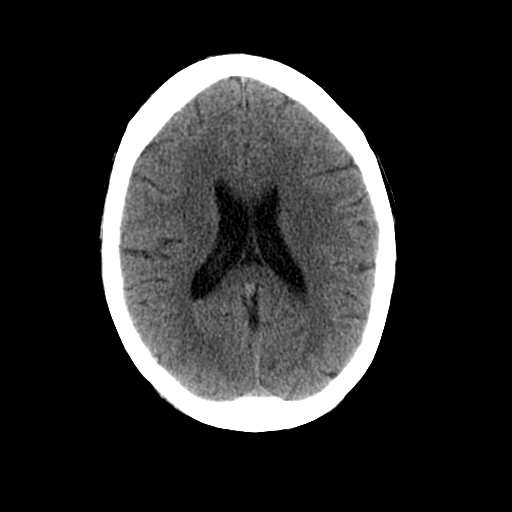
[im 18/32  brain]
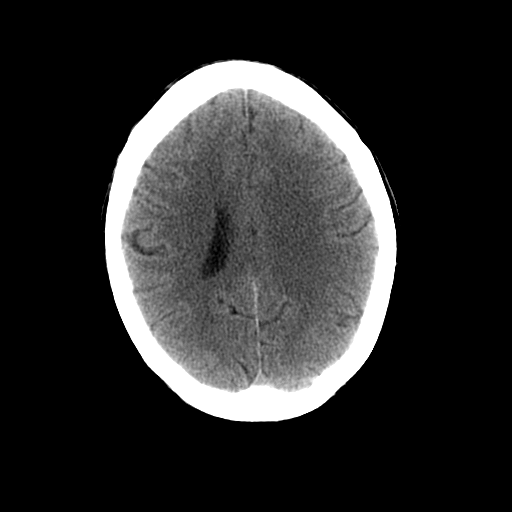
[im 20/32  brain]
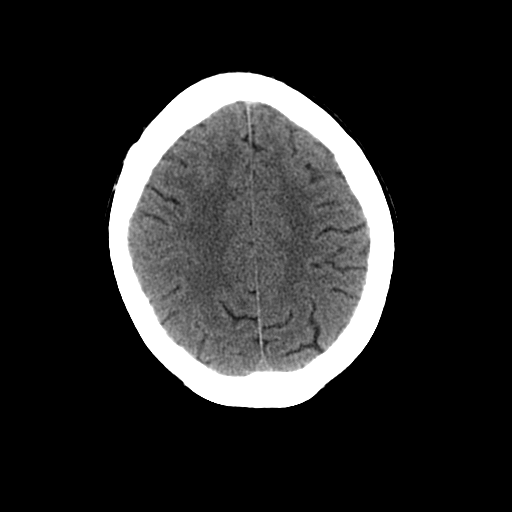
[im 20/32  bone]
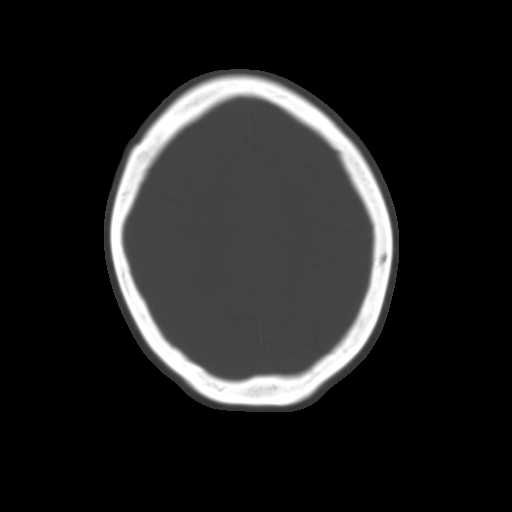
[im 23/32  brain]
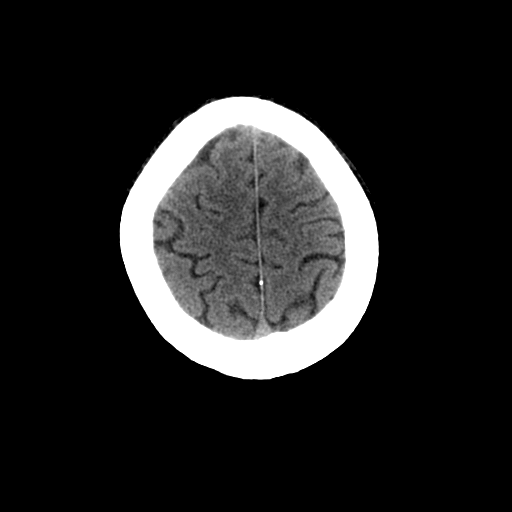
[im 25/32  brain]
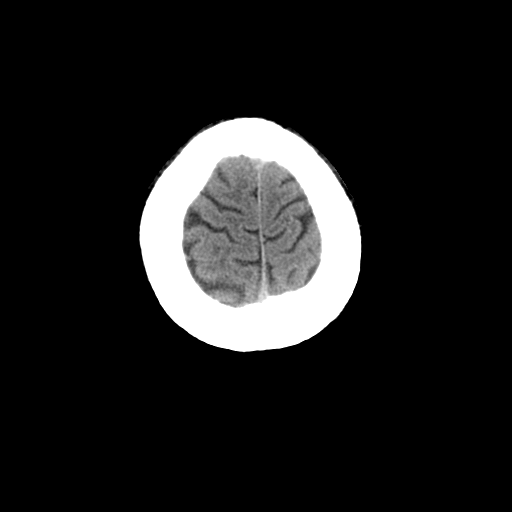
[im 27/32  brain]
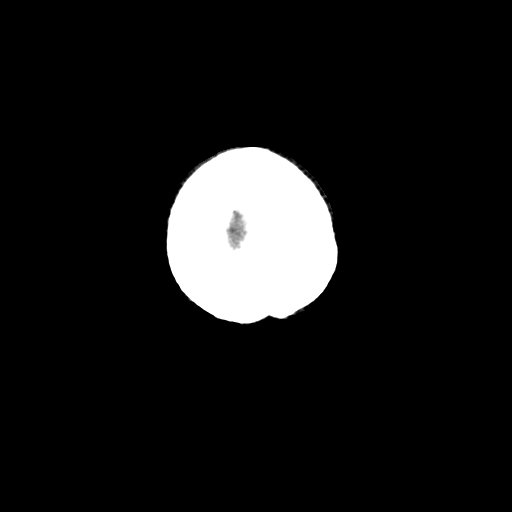
[im 29/32  brain]
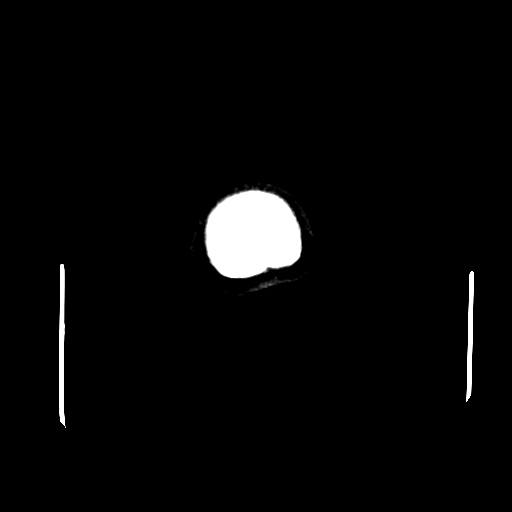
[im 29/32  bone]
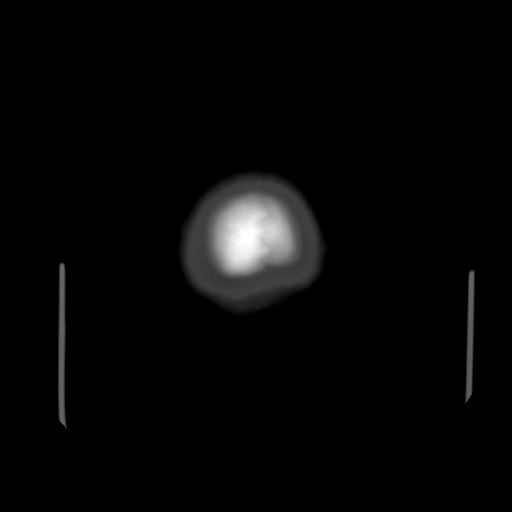

[13 of 30 positions shown; findings below may reference images not displayed]

FINDINGS: Visualized paranasal sinuses and mastoids are clear.  No
acute osseous abnormality identified.  Visualized orbits and scalp
soft tissues are within normal limits.

Cerebral volume has not significantly changed.  No
ventriculomegaly. Gray-white matter differentiation is within
normal limits throughout the brain.  No evidence of cortically
based acute infarction identified.  No midline shift, mass effect,
or evidence of mass lesion.  No acute intracranial hemorrhage
identified.
IMPRESSION: Stable and normal noncontrast CT appearance of the brain.

## 2015-04-06 IMAGING — CR DG CHEST 2V
2 series · 2 of 2 positions shown · non-contrast
Comparison: CT chest 09/03/2009 and chest radiograph 07/17/2007

CLINICAL DATA: Substernal chest pain

CHEST - 2 VIEW

[w chest pa]
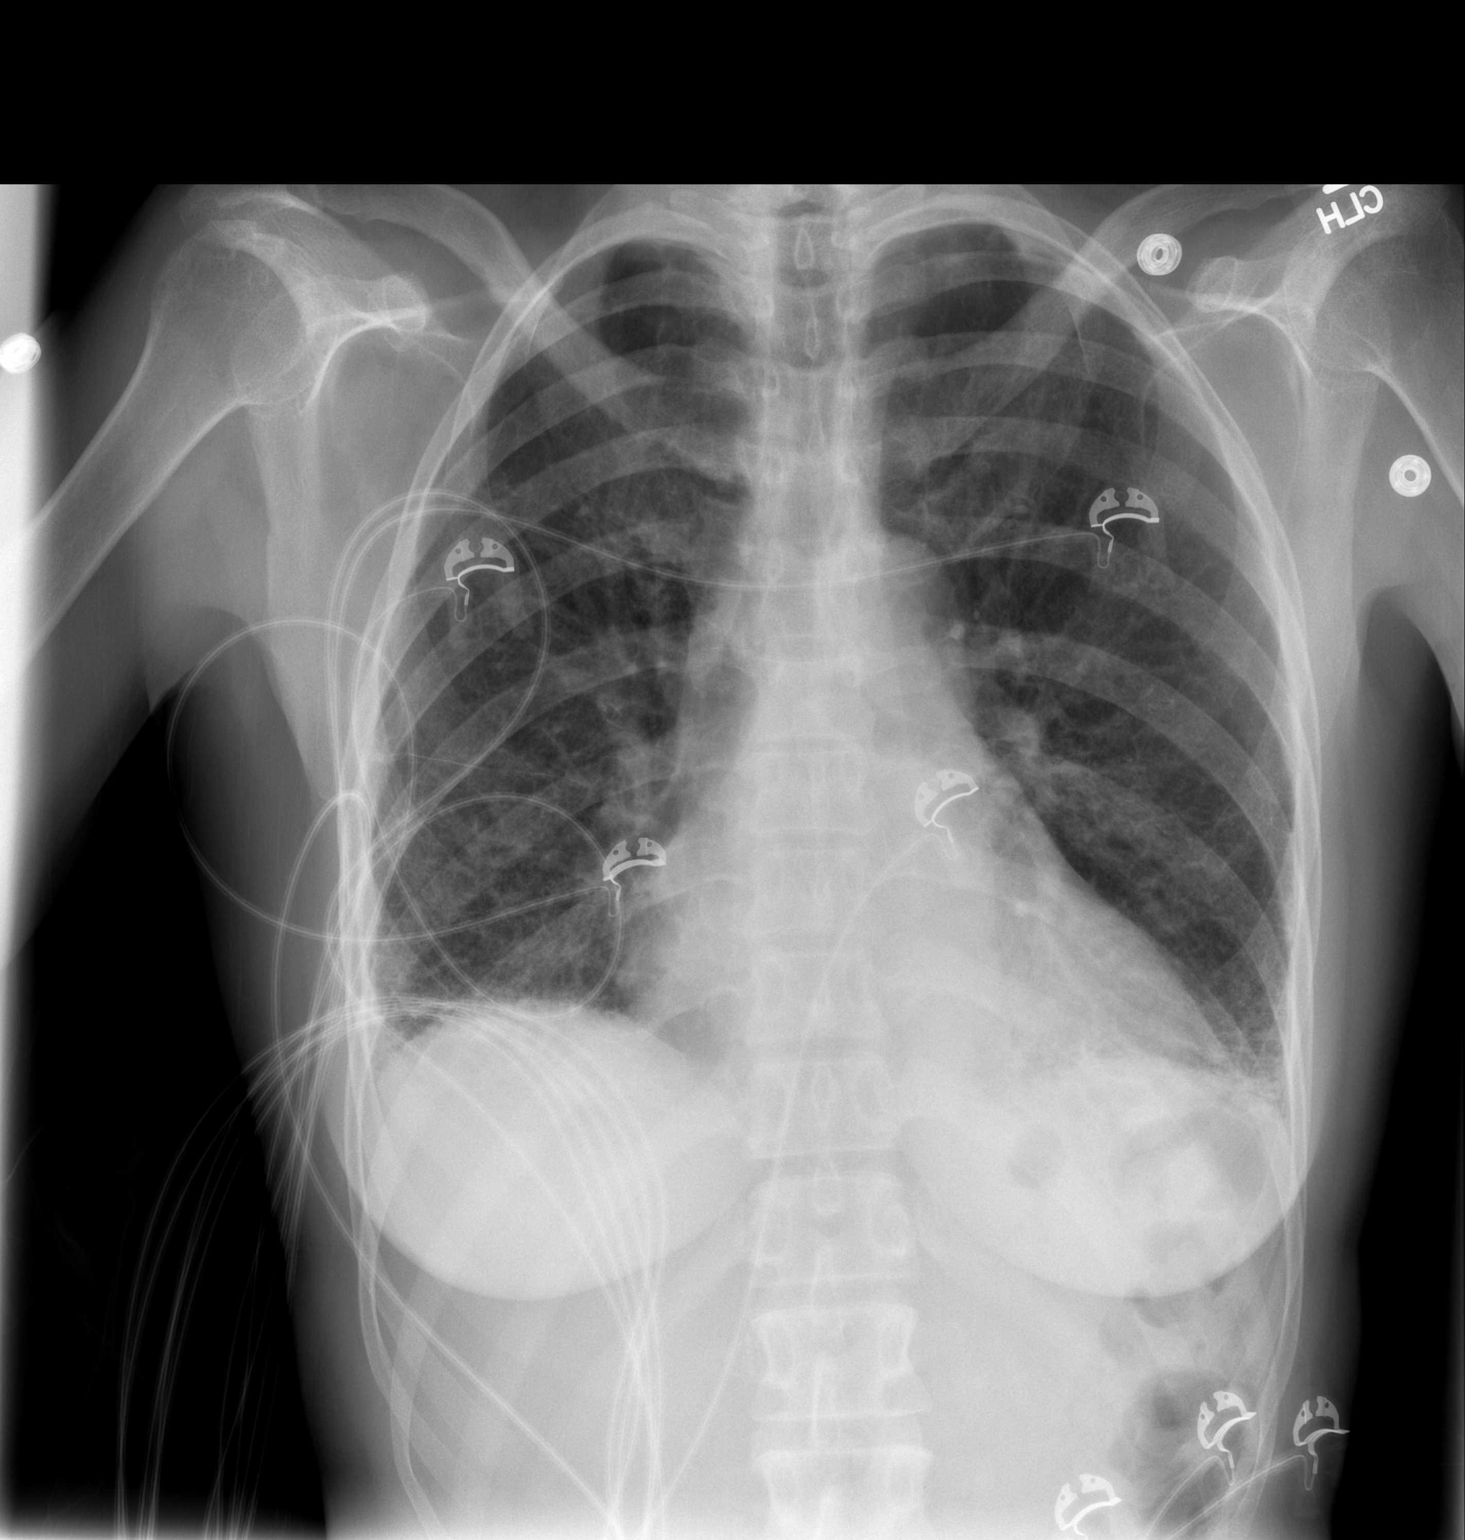

[w chest lat]
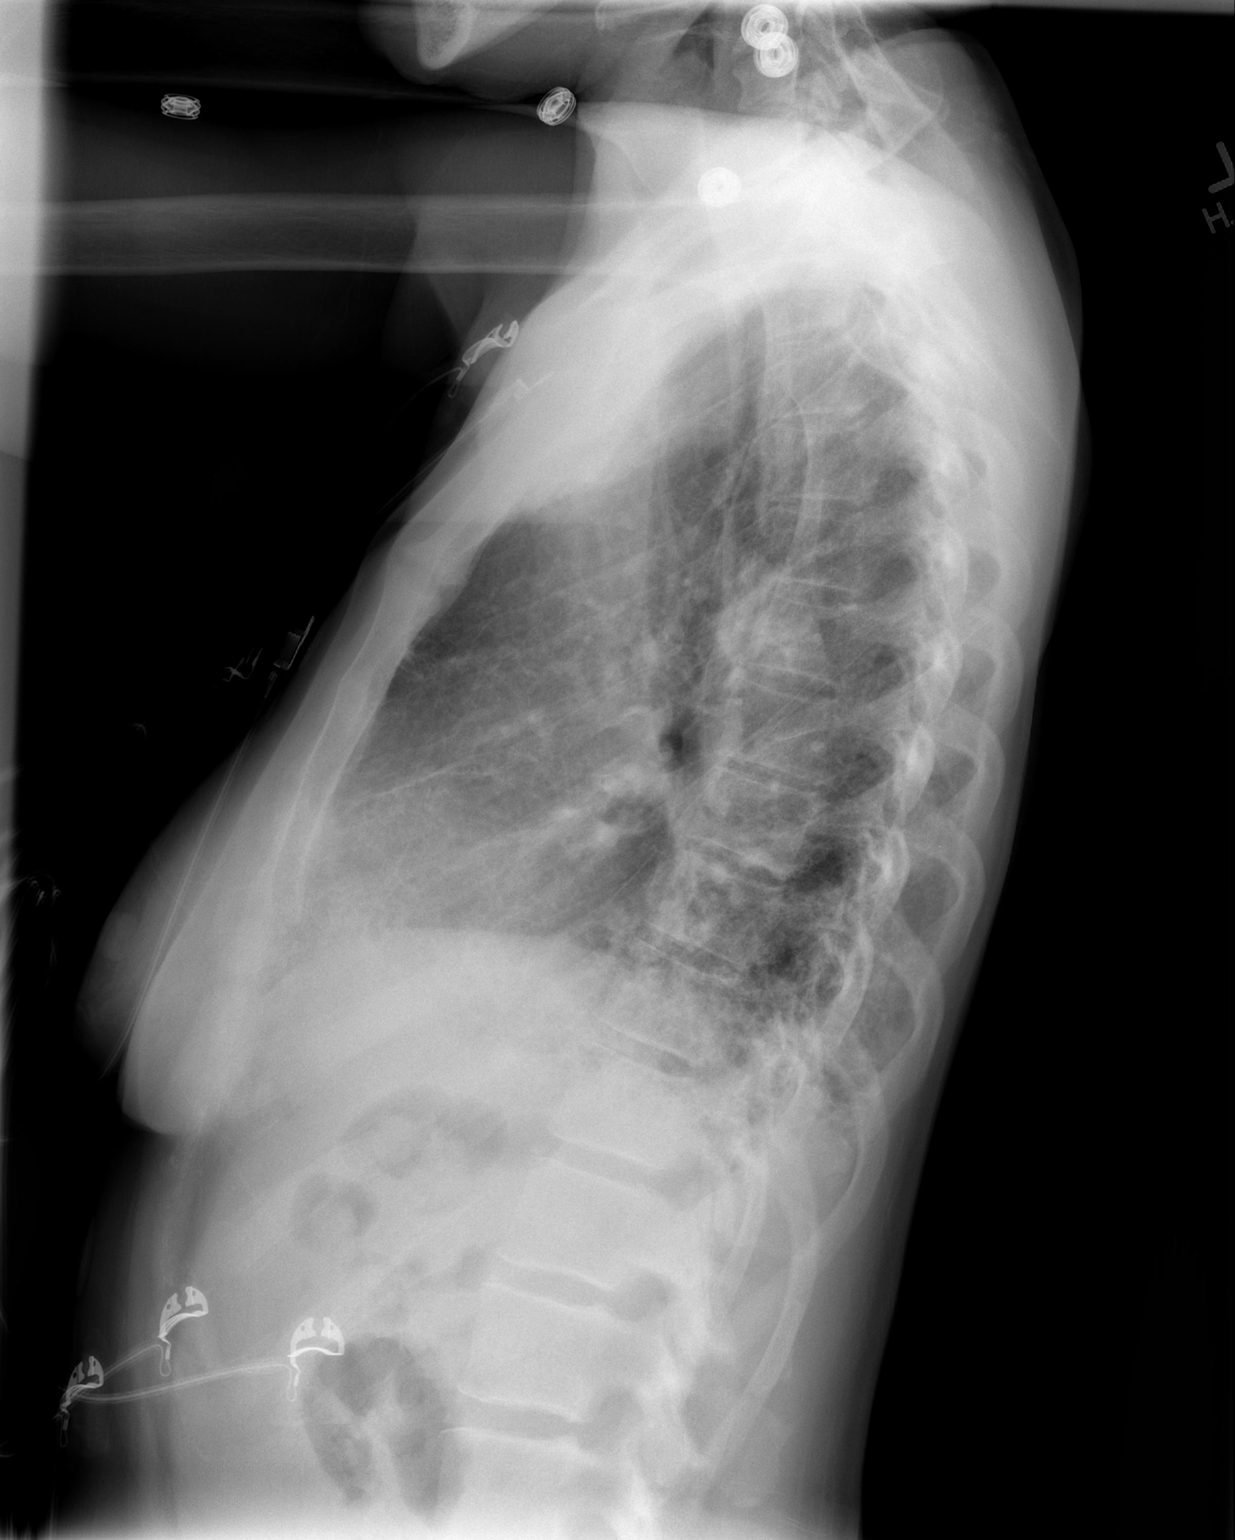

[2 of 2 positions shown; findings below may reference images not displayed]

FINDINGS: Stable mild cardiomegaly.  Mediastinal hilar contours are
normal in stable. Bibasilar pulmonary fibrosis appears without
significant change since imaging studies of 6771 and 6101.  The
lung volumes are slightly low bilaterally.  Pulmonary vascularity
is normal.  No airspace disease or pleural effusion.  Negative for
pneumothorax.  The no acute or suspicious bony abnormality is
identified.  Visualized bowel gas pattern is nonobstructive.
IMPRESSION: Chronic bibasilar pulmonary fibrosis with associated low volumes.
No acute superimposed abnormality is identified.

## 2015-08-26 ENCOUNTER — Ambulatory Visit: Payer: Self-pay | Admitting: Internal Medicine

## 2019-05-06 ENCOUNTER — Other Ambulatory Visit: Payer: Self-pay | Admitting: Internal Medicine

## 2019-05-06 DIAGNOSIS — Z20822 Contact with and (suspected) exposure to covid-19: Secondary | ICD-10-CM

## 2019-05-08 LAB — NOVEL CORONAVIRUS, NAA: SARS-CoV-2, NAA: NOT DETECTED

## 2019-06-22 ENCOUNTER — Emergency Department (HOSPITAL_COMMUNITY)
Admission: EM | Admit: 2019-06-22 | Discharge: 2019-06-22 | Disposition: A | Discharge status: Home / Self Care | Payer: BC Managed Care – PPO | Attending: Emergency Medicine | Admitting: Emergency Medicine

## 2019-06-22 ENCOUNTER — Encounter (HOSPITAL_COMMUNITY): Payer: Self-pay | Admitting: Emergency Medicine

## 2019-06-22 ENCOUNTER — Other Ambulatory Visit: Payer: Self-pay

## 2019-06-22 DIAGNOSIS — R109 Unspecified abdominal pain: Secondary | ICD-10-CM | POA: Insufficient documentation

## 2019-06-22 DIAGNOSIS — Z5321 Procedure and treatment not carried out due to patient leaving prior to being seen by health care provider: Secondary | ICD-10-CM | POA: Insufficient documentation

## 2019-06-22 NOTE — ED Triage Notes (Signed)
Per pt, states she has had abdominal pain on and off for over a month-states increased pain this Sunday-states pain is only when she ambulates-previous symptoms caused by constipation-states normal BM today-states PCP sent to r/o SBO

## 2019-06-23 ENCOUNTER — Inpatient Hospital Stay (HOSPITAL_COMMUNITY)
Admission: EM | Admit: 2019-06-23 | Discharge: 2019-06-26 | DRG: 196 | Disposition: A | Discharge status: Home / Self Care | Payer: BC Managed Care – PPO | Attending: Internal Medicine | Admitting: Internal Medicine

## 2019-06-23 ENCOUNTER — Emergency Department (HOSPITAL_COMMUNITY): Payer: BC Managed Care – PPO

## 2019-06-23 ENCOUNTER — Observation Stay (HOSPITAL_COMMUNITY): Payer: BC Managed Care – PPO

## 2019-06-23 ENCOUNTER — Encounter (HOSPITAL_COMMUNITY): Payer: Self-pay

## 2019-06-23 ENCOUNTER — Other Ambulatory Visit: Payer: Self-pay

## 2019-06-23 DIAGNOSIS — I509 Heart failure, unspecified: Secondary | ICD-10-CM

## 2019-06-23 DIAGNOSIS — E8770 Fluid overload, unspecified: Secondary | ICD-10-CM | POA: Diagnosis not present

## 2019-06-23 DIAGNOSIS — M0519 Rheumatoid lung disease with rheumatoid arthritis of multiple sites: Principal | ICD-10-CM | POA: Diagnosis present

## 2019-06-23 DIAGNOSIS — J439 Emphysema, unspecified: Secondary | ICD-10-CM | POA: Diagnosis present

## 2019-06-23 DIAGNOSIS — Z7952 Long term (current) use of systemic steroids: Secondary | ICD-10-CM

## 2019-06-23 DIAGNOSIS — J9601 Acute respiratory failure with hypoxia: Secondary | ICD-10-CM | POA: Diagnosis present

## 2019-06-23 DIAGNOSIS — Z20828 Contact with and (suspected) exposure to other viral communicable diseases: Secondary | ICD-10-CM | POA: Diagnosis present

## 2019-06-23 DIAGNOSIS — Z8269 Family history of other diseases of the musculoskeletal system and connective tissue: Secondary | ICD-10-CM

## 2019-06-23 DIAGNOSIS — M0510 Rheumatoid lung disease with rheumatoid arthritis of unspecified site: Secondary | ICD-10-CM

## 2019-06-23 DIAGNOSIS — I110 Hypertensive heart disease with heart failure: Secondary | ICD-10-CM | POA: Diagnosis present

## 2019-06-23 DIAGNOSIS — J8410 Pulmonary fibrosis, unspecified: Secondary | ICD-10-CM | POA: Diagnosis present

## 2019-06-23 DIAGNOSIS — R0602 Shortness of breath: Secondary | ICD-10-CM | POA: Diagnosis not present

## 2019-06-23 DIAGNOSIS — I480 Paroxysmal atrial fibrillation: Secondary | ICD-10-CM | POA: Diagnosis present

## 2019-06-23 DIAGNOSIS — M20031 Swan-neck deformity of right finger(s): Secondary | ICD-10-CM | POA: Diagnosis present

## 2019-06-23 DIAGNOSIS — I5032 Chronic diastolic (congestive) heart failure: Secondary | ICD-10-CM | POA: Diagnosis present

## 2019-06-23 DIAGNOSIS — J8489 Other specified interstitial pulmonary diseases: Secondary | ICD-10-CM | POA: Diagnosis present

## 2019-06-23 DIAGNOSIS — M329 Systemic lupus erythematosus, unspecified: Secondary | ICD-10-CM | POA: Diagnosis present

## 2019-06-23 DIAGNOSIS — Z7901 Long term (current) use of anticoagulants: Secondary | ICD-10-CM

## 2019-06-23 DIAGNOSIS — Z8379 Family history of other diseases of the digestive system: Secondary | ICD-10-CM

## 2019-06-23 DIAGNOSIS — M65841 Other synovitis and tenosynovitis, right hand: Secondary | ICD-10-CM | POA: Diagnosis present

## 2019-06-23 DIAGNOSIS — Z8249 Family history of ischemic heart disease and other diseases of the circulatory system: Secondary | ICD-10-CM

## 2019-06-23 DIAGNOSIS — K8020 Calculus of gallbladder without cholecystitis without obstruction: Secondary | ICD-10-CM | POA: Diagnosis not present

## 2019-06-23 DIAGNOSIS — Z8679 Personal history of other diseases of the circulatory system: Secondary | ICD-10-CM

## 2019-06-23 DIAGNOSIS — K828 Other specified diseases of gallbladder: Secondary | ICD-10-CM | POA: Diagnosis present

## 2019-06-23 DIAGNOSIS — I4581 Long QT syndrome: Secondary | ICD-10-CM | POA: Diagnosis present

## 2019-06-23 DIAGNOSIS — I313 Pericardial effusion (noninflammatory): Secondary | ICD-10-CM | POA: Diagnosis present

## 2019-06-23 DIAGNOSIS — Z79899 Other long term (current) drug therapy: Secondary | ICD-10-CM

## 2019-06-23 HISTORY — DX: Rheumatoid lung disease with rheumatoid arthritis of unspecified site: M05.10

## 2019-06-23 LAB — URINALYSIS, ROUTINE W REFLEX MICROSCOPIC
Bilirubin Urine: NEGATIVE
Glucose, UA: NEGATIVE mg/dL
Hgb urine dipstick: NEGATIVE
Ketones, ur: NEGATIVE mg/dL
Leukocytes,Ua: NEGATIVE
Nitrite: POSITIVE — AB
Protein, ur: 30 mg/dL — AB
Specific Gravity, Urine: 1.019 (ref 1.005–1.030)
pH: 6 (ref 5.0–8.0)

## 2019-06-23 LAB — CBC
HCT: 41 % (ref 36.0–46.0)
Hemoglobin: 12.3 g/dL (ref 12.0–15.0)
MCH: 28.8 pg (ref 26.0–34.0)
MCHC: 30 g/dL (ref 30.0–36.0)
MCV: 96 fL (ref 80.0–100.0)
Platelets: 463 10*3/uL — ABNORMAL HIGH (ref 150–400)
RBC: 4.27 MIL/uL (ref 3.87–5.11)
RDW: 16.9 % — ABNORMAL HIGH (ref 11.5–15.5)
WBC: 11.2 10*3/uL — ABNORMAL HIGH (ref 4.0–10.5)
nRBC: 0 % (ref 0.0–0.2)

## 2019-06-23 LAB — COMPREHENSIVE METABOLIC PANEL
ALT: 18 U/L (ref 0–44)
AST: 35 U/L (ref 15–41)
Albumin: 2.9 g/dL — ABNORMAL LOW (ref 3.5–5.0)
Alkaline Phosphatase: 61 U/L (ref 38–126)
Anion gap: 9 (ref 5–15)
BUN: 17 mg/dL (ref 6–20)
CO2: 22 mmol/L (ref 22–32)
Calcium: 8.8 mg/dL — ABNORMAL LOW (ref 8.9–10.3)
Chloride: 108 mmol/L (ref 98–111)
Creatinine, Ser: 1.25 mg/dL — ABNORMAL HIGH (ref 0.44–1.00)
GFR calc Af Amer: 56 mL/min — ABNORMAL LOW (ref >60)
GFR calc non Af Amer: 48 mL/min — ABNORMAL LOW (ref >60)
Glucose, Bld: 88 mg/dL (ref 70–99)
Potassium: 4 mmol/L (ref 3.5–5.1)
Sodium: 139 mmol/L (ref 135–145)
Total Bilirubin: 0.6 mg/dL (ref 0.3–1.2)
Total Protein: 8.4 g/dL — ABNORMAL HIGH (ref 6.5–8.1)

## 2019-06-23 LAB — D-DIMER, QUANTITATIVE: D-Dimer, Quant: 2.63 ug/mL-FEU — ABNORMAL HIGH (ref 0.00–0.50)

## 2019-06-23 LAB — LIPASE, BLOOD: Lipase: 25 U/L (ref 11–51)

## 2019-06-23 LAB — TROPONIN I (HIGH SENSITIVITY)
Troponin I (High Sensitivity): 55 ng/L — ABNORMAL HIGH (ref <18)
Troponin I (High Sensitivity): 67 ng/L — ABNORMAL HIGH (ref <18)

## 2019-06-23 LAB — BRAIN NATRIURETIC PEPTIDE: B Natriuretic Peptide: 1500.6 pg/mL — ABNORMAL HIGH (ref 0.0–100.0)

## 2019-06-23 MED ORDER — SODIUM CHLORIDE (PF) 0.9 % IJ SOLN
INTRAMUSCULAR | Status: AC
  Filled 2019-06-23: qty 50

## 2019-06-23 MED ORDER — SODIUM CHLORIDE 0.9% FLUSH
3.0000 mL | Freq: Once | INTRAVENOUS | Status: AC
  Administered 2019-06-23: 18:00:00 3 mL via INTRAVENOUS

## 2019-06-23 MED ORDER — SODIUM CHLORIDE 0.9% FLUSH
3.0000 mL | Freq: Two times a day (BID) | INTRAVENOUS | Status: DC
  Administered 2019-06-24 – 2019-06-26 (×4): 3 mL via INTRAVENOUS

## 2019-06-23 MED ORDER — ACETAMINOPHEN 325 MG PO TABS
650.0000 mg | ORAL_TABLET | ORAL | Status: DC | PRN
  Administered 2019-06-24 – 2019-06-25 (×2): 650 mg via ORAL
  Filled 2019-06-23 (×2): qty 2

## 2019-06-23 MED ORDER — IOHEXOL 350 MG/ML SOLN
100.0000 mL | Freq: Once | INTRAVENOUS | Status: AC | PRN
  Administered 2019-06-23: 20:00:00 100 mL via INTRAVENOUS

## 2019-06-23 MED ORDER — ENOXAPARIN SODIUM 40 MG/0.4ML ~~LOC~~ SOLN
40.0000 mg | Freq: Every day | SUBCUTANEOUS | Status: DC
  Administered 2019-06-24: 11:00:00 40 mg via SUBCUTANEOUS
  Filled 2019-06-23 (×4): qty 0.4

## 2019-06-23 MED ORDER — FUROSEMIDE 10 MG/ML IJ SOLN
40.0000 mg | Freq: Every day | INTRAMUSCULAR | Status: DC
  Administered 2019-06-24: 40 mg via INTRAVENOUS
  Filled 2019-06-23: qty 4

## 2019-06-23 MED ORDER — SODIUM CHLORIDE 0.9 % IV BOLUS
500.0000 mL | Freq: Once | INTRAVENOUS | Status: AC
  Administered 2019-06-23: 21:00:00 500 mL via INTRAVENOUS

## 2019-06-23 MED ORDER — SODIUM CHLORIDE 0.9% FLUSH: 3.0000 mL | INTRAVENOUS | Status: DC | PRN

## 2019-06-23 MED ORDER — SODIUM CHLORIDE 0.9 % IV SOLN: 250.0000 mL | INTRAVENOUS | Status: DC | PRN

## 2019-06-23 MED ORDER — ONDANSETRON HCL 4 MG/2ML IJ SOLN: 4.0000 mg | Freq: Four times a day (QID) | INTRAMUSCULAR | Status: DC | PRN

## 2019-06-23 MED ORDER — FUROSEMIDE 10 MG/ML IJ SOLN
40.0000 mg | Freq: Once | INTRAMUSCULAR | Status: AC
  Administered 2019-06-23: 23:00:00 40 mg via INTRAVENOUS
  Filled 2019-06-23: qty 4

## 2019-06-23 NOTE — ED Notes (Signed)
Patient transported to CT 

## 2019-06-23 NOTE — ED Notes (Signed)
EKG given to yelverton,MD by [facility]

## 2019-06-23 NOTE — ED Notes (Signed)
Assisted pt to restroom  

## 2019-06-23 NOTE — H&P (Signed)
History and Physical    Carolyn Proctor JGO:[id number] DOB: 1961-01-24 DOA: 06/23/2019  PCP: Glendale Chard, MD  Patient coming from: Home  I have personally briefly reviewed patient's old medical records in [place]  Chief Complaint: SOB  HPI: Carolyn Proctor is a 58 y.o. female with medical history significant of PAF, not on chronic anticoagulation anymore, HTN, RA with rheumatic ILD that previously had been asymptomatic and stable from 2008-2014.  Patient presents to the ED with c/o 3 month h/o episodic upper abd pain, DOE, non-productive cough.  No N/V, no melena.   ED Course: BNP 1500, CT chest showed: progression of ILD since 2014 with superimposed mild pulmonary edema and mild pericardial effusion.  No PE.  CT abd/pelvis: non-specific free fluid (edema), small amount of pericholicystic fluid but this non-sepcific in setting of the rest of the abdominal edema.  RUQ [place]: trace pericholicystic fluid but no other findings of cholecystitis, no gallstones.  LFTs nl, WBC 11k.   Review of Systems: As per HPI, otherwise all review of systems negative.  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 She has never used smokeless tobacco. She reports that she does not drink alcohol or use drugs.  No Known Allergies  Family History  Problem Relation Age of Onset   Lupus Mother    Liver  disease Mother    Hypertension Maternal Grandmother    Lupus Brother      Prior to Admission medications   Medication Sig Start Date End Date Taking? Authorizing Provider  cetirizine (ZYRTEC) 10 MG tablet Take 10 mg by mouth daily.   Yes [provider]  cholecalciferol (VITAMIN D) 1000 UNITS tablet Take 3,000 Units by mouth daily.    Yes [provider]  Cyanocobalamin (VITAMIN B-12) 1000 MCG SUBL Place 1,000 mcg under the tongue.   Yes [provider]  naproxen sodium (ALEVE) 220 MG tablet Take 220 mg by mouth 2 (two) times daily as needed (pain).   Yes [provider]  acetaminophen (TYLENOL) 325 MG tablet Take 2 tablets (650 mg total) by mouth every 4 (four) hours as needed for pain or fever. Patient not taking: Reported on 06/23/2019 02/25/13   Erlene Quan, PA-C  colchicine 0.6 MG tablet Take 1 tablet (0.6 mg total) by mouth 2 (two) times daily. Patient not taking: Reported on 06/23/2019 02/25/13   Erlene Quan, PA-C  metoprolol tartrate (LOPRESSOR) 25 MG tablet Take 1 tablet (25 mg total) by mouth 2 (two) times daily. Patient not taking: Reported on 06/23/2019 02/25/13   Erlene Quan, PA-C  Rivaroxaban (XARELTO) 20 MG TABS Take 1 tablet (20 mg total) by mouth daily. Patient not taking: Reported on 06/23/2019 02/26/13   Erlene Quan, PA-C    Physical Exam: Vitals:   06/23/19 1855 06/23/19 2012 06/23/19 2031 06/23/19 2038  BP: 121/87 137/89 122/86  Pulse: 100 (!) 36 71   Resp: 18 (!) 27 20   Temp:      TempSrc:      SpO2: 94% 100% (!) 81%   Weight:    62.6 kg  Height:    '5\' 11"'  (1.803 m)    Constitutional: NAD, calm, comfortable Eyes: PERRL, lids and conjunctivae normal ENMT: Mucous membranes are moist. Posterior pharynx clear of any exudate or lesions.Normal dentition.  Neck: normal, supple, no masses, no thyromegaly Respiratory: Bibasilar crackles Cardiovascular: Regular rate and rhythm, no murmurs / rubs / gallops. No extremity  edema. 2+ pedal pulses. No carotid bruits.  Abdomen: no tenderness, no masses palpated. No hepatosplenomegaly. Bowel sounds positive.  Musculoskeletal: no clubbing / cyanosis. No joint deformity upper and lower extremities. Good ROM, no contractures. Normal muscle tone.  Skin: no rashes, lesions, ulcers. No induration Neurologic: CN 2-12 grossly intact. Sensation intact, DTR normal. Strength 5/5 in all 4.  Psychiatric: Normal judgment and insight. Alert and oriented x 3. Normal mood.    Labs on Admission: I have personally reviewed following labs and imaging studies  CBC: Recent Labs  Lab 06/23/19 1800  WBC 11.2*  HGB 12.3  HCT 41.0  MCV 96.0  PLT 010*   Basic Metabolic Panel: Recent Labs  Lab 06/23/19 1800  NA 139  K 4.0  CL 108  CO2 22  GLUCOSE 88  BUN 17  CREATININE 1.25*  CALCIUM 8.8*   GFR: Estimated Creatinine Clearance: 50.3 mL/min (A) (by C-G formula based on SCr of 1.25 mg/dL (H)). Liver Function Tests: Recent Labs  Lab 06/23/19 1800  AST 35  ALT 18  ALKPHOS 61  BILITOT 0.6  PROT 8.4*  ALBUMIN 2.9*   Recent Labs  Lab 06/23/19 1800  LIPASE 25   No results for input(s): AMMONIA in the last 168 hours. Coagulation Profile: No results for input(s): INR, PROTIME in the last 168 hours. Cardiac Enzymes: No results for input(s): CKTOTAL, CKMB, CKMBINDEX, TROPONINI in the last 168 hours. BNP (last 3 results) No results for input(s): PROBNP in the last 8760 hours. HbA1C: No results for input(s): HGBA1C in the last 72 hours. CBG: No results for input(s): GLUCAP in the last 168 hours. Lipid Profile: No results for input(s): CHOL, HDL, LDLCALC, TRIG, CHOLHDL, LDLDIRECT in the last 72 hours. Thyroid Function Tests: No results for input(s): TSH, T4TOTAL, FREET4, T3FREE, THYROIDAB in the last 72 hours. Anemia Panel: No results for input(s): VITAMINB12, FOLATE, FERRITIN, TIBC, IRON, RETICCTPCT in the last 72 hours. Urine analysis:    Component Value  Date/Time   COLORURINE YELLOW 06/23/2019 2037   APPEARANCEUR HAZY (A) 06/23/2019 2037   LABSPEC 1.019 06/23/2019 2037   PHURINE 6.0 06/23/2019 2037   GLUCOSEU NEGATIVE 06/23/2019 2037   HGBUR NEGATIVE 06/23/2019 2037   [place] NEGATIVE 06/23/2019 2037   [place] NEGATIVE 06/23/2019 2037   PROTEINUR 30 (A) 06/23/2019 2037   UROBILINOGEN 1.0 01/16/2013 1737   NITRITE POSITIVE (A) 06/23/2019 2037   LEUKOCYTESUR NEGATIVE 06/23/2019 2037    Radiological Exams on Admission: Dg Chest 2 View  Result Date: 06/23/2019 CLINICAL DATA:  Upper abdominal pain and shortness of breath with exertion. EXAM: CHEST - 2 VIEW COMPARISON:  PA and lateral chest 02/22/2013 and CT chest 05/13/2013. FINDINGS: There is cardiomegaly. Coarsening of the pulmonary interstitium consistent with fibrosis has markedly worsened since the prior examination and appears worst in the lower lobes where it is more severe on the right. No pneumothorax or pleural effusion. No acute or  focal bony abnormality. IMPRESSION: No acute disease. Marked progression of pulmonary fibrosis. Cardiomegaly. Electronically Signed   By: Inge Rise M.D.   On: 06/23/2019 18:04   Ct Angio Chest Pe W And/or Wo Contrast  Result Date: 06/23/2019 CLINICAL DATA:  Shortness of breath and positive D-dimer EXAM: CT ANGIOGRAPHY CHEST WITH CONTRAST TECHNIQUE: Multidetector CT imaging of the chest was performed using the standard protocol during bolus administration of intravenous contrast. Multiplanar CT image reconstructions and MIPs were obtained to evaluate the vascular anatomy. CONTRAST:  155m OMNIPAQUE IOHEXOL 350 MG/ML SOLN COMPARISON:  Chest x-ray from earlier in the same day, CT from 05/13/2013. FINDINGS: Cardiovascular: Thoracic aorta demonstrates atherosclerotic calcifications although poor enhancement is noted. No aneurysmal dilatation is seen. Cardiac shadow is enlarged. Mild pericardial effusion is seen measuring 15 mm in greatest dimension at  its thickest point. The pulmonary artery is well visualized with a normal branching pattern. No filling defects to suggest pulmonary emboli are identified. Mediastinum/Nodes: The thoracic inlet is within normal limits. Stable right paratracheal lymph node is seen. Pre-vascular and AP window lymph nodes are seen slightly more prominent than that noted on the prior exam. No sizable hilar adenopathy is noted. The esophagus as visualized is within normal limits. Lungs/Pleura: Diffuse emphysematous changes are noted with multiple subpleural bowl identified. Underlying fibrotic changes are noted particularly in the lower lobes bilaterally slightly progressed when compared with the prior exam of 2014. Generalized increased density of lung parenchyma is noted likely related to underlying edema. No focal confluent infiltrate is seen. No sizable parenchymal nodule is noted. No effusion or pneumothorax is seen. Upper Abdomen: Covered in depth on the CT of the abdomen and pelvis. Musculoskeletal: Degenerative changes of the thoracic spine are seen. No acute bony abnormality is noted. Review of the MIP images confirms the above findings. IMPRESSION: No evidence of pulmonary emboli are identified. New pericardial effusion as described. Significant fibrotic changes within the lungs bilaterally progressed in the interval from the prior exam of 2014. Some superimposed edema is noted as well. No focal confluent infiltrate is seen. Slight progression in reactive lymph nodes in the AP window and prevascular region when compared with the prior study. Aortic Atherosclerosis (ICD10-I70.0) and Emphysema (ICD10-J43.9). Electronically Signed   By: MInez CatalinaM.D.   On: 06/23/2019 20:23   Ct Abdomen Pelvis W Contrast  Result Date: 06/23/2019 CLINICAL DATA:  Abdominal pain, shortness of breath with exertion EXAM: CT ABDOMEN AND PELVIS WITH CONTRAST TECHNIQUE: Multidetector CT imaging of the abdomen and pelvis was performed using the  standard protocol following bolus administration of intravenous contrast. CONTRAST:  1058mOMNIPAQUE IOHEXOL 350 MG/ML SOLN COMPARISON:  CT abdomen pelvis 07/12/2012 FINDINGS: Lower chest: Redemonstrated fibrotic interstitial changes of the lungs likely with superimposed edema cardiomegaly with pericardial effusion. Dedicated CT of the chest was performed. Please see dedicated report. Hepatobiliary: No focal liver abnormality is seen. Small volume pericholecystic fluid. No gallstones, gallbladder wall thickening, or biliary dilatation. Pancreas: Unremarkable. No pancreatic ductal dilatation or surrounding inflammatory changes. Spleen: Normal in size without focal abnormality. Adrenals/Urinary Tract: Adrenal glands are unremarkable. Kidneys are normal, without renal calculi, focal lesion, or hydronephrosis. Circumferential bladder wall thickening. Stomach/Bowel: Distal esophagus, stomach and duodenal sweep are unremarkable. No bowel wall thickening or dilatation. No evidence of obstruction. A normal appendix is visualized. Scattered colonic diverticula without focal pericolonic inflammation to suggest diverticulitis. Vascular/Lymphatic: The aorta is normal caliber. No suspicious or enlarged lymph nodes in the included lymphatic chains. Reproductive: Anteverted uterus. No concerning  adnexal lesions. Other: Trace simple free fluid layering in the deep pelvis. Mild body wall edema. No bowel containing hernia. No free air or organized collections. Musculoskeletal: Multilevel degenerative changes are present in the imaged portions of the spine. No acute or concerning osseous lesions. IMPRESSION: 1. Fibrotic interstitial changes of the lungs with some possible superimposed edema as well as cardiomegaly with effusion. Please see report of the dedicated CT angiography of the chest performed the same day. 2. Circumferential bladder wall thickening, which could reflect cystitis. Correlate with urinalysis. 3. Trace simple free  fluid layering in the deep pelvis, and mild body wall edema, suggestive of volume overload. 4. Mild pericholecystic fluid without other findings of acute cholecystitis. Nonspecific in the setting of additional free intraperitoneal fluid. Correlate with abdominal symptoms and consider right upper quadrant ultrasound if there are supportive clinical features. Electronically Signed   By: Lovena Le M.D.   On: 06/23/2019 20:21   US Abdomen Limited  Result Date: 06/23/2019 CLINICAL DATA:  Upper abdominal pain for 2-3 weeks. EXAM: ULTRASOUND ABDOMEN LIMITED RIGHT UPPER QUADRANT COMPARISON:  None. FINDINGS: Gallbladder: No gallstones or wall thickening visualized. Trace amount of pericholecystic fluid is seen. No sonographic Murphy sign noted by sonographer. Common bile duct: Diameter: 0.5 cm Liver: No focal lesion identified. Within normal limits in parenchymal echogenicity. Portal vein is patent on color Doppler imaging with normal direction of blood flow towards the liver. Other: None. IMPRESSION: Negative for gallstones. Trace amount of pericholecystic fluid is nonspecific and may be due to volume overload. Electronically Signed   By: Inge Rise M.D.   On: 06/23/2019 18:58    EKG: Independently reviewed.  Assessment/Plan Principal Problem:   New onset of congestive heart failure (HCC) Active Problems:   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 2008 -> 2014 wihtout change   Rheumatoid lung disease ([place])    1. Rheumatic lung disease - progressed since 2014 now 1. Call Pulm for consult in AM 2. Looks like shes not on ANY DMARDs at the moment 2. New onset CHF / pulm edema - 1. Lasix 45m IV x1 in ED 2. Then 418mIV daily 3. CHF pathway 4. Daily BMP 5. Strict intake and output 6. 2d echo 3. Epigastric abd pain - 1. Non-specific 2. Gallbladder with trace pericholicystic fluid that may just be from the abdominal edema associated with likely new onset CHF.  No other findings on LFTs, [place] to  suggest acute cholecystitis 3. Will repeat CMP in AM to keep eye on LFTs  DVT prophylaxis: Lovenox Code Status: Full Family Communication: No family in room Disposition Plan: Home after admit Consults called: None, call pulm in AM Admission status: Place in obs   Dona Walby, JAThayerospitalists  How to contact the [facility] Cent[facility]tending or Consulting provider 7ANew [place]r covering provider during after hours 7PView Park-Windsor Hillsfor this patient?  1. Check the care team in [facility] look for a) attending/consulting TRH provider listed and b) the TRSouthwestern Regional Medical Centeream listed 2. Log into [email or web address]  Amion Physician Scheduling and messaging for groups and whole hospitals  On call and physician scheduling software for group practices, residents, hospitalists and other medical providers for call, clinic, rotation and shift schedules. OnCall [employer] is a hospital-wide system for scheduling doctors and paging doctors on call. EasyPlot is for scientific plotting and data analysis.  [email or web address]  and use [place] universal password to access. If you do not have the password, please contact the  hospital operator.  3. Locate the [facility] provider you are looking for under [facility] and page to a number that you can be directly reached. 4. If you still have difficulty reaching the provider, please page the [facility] (Director on Call) for the Hospitalists listed on amion for assistance.  06/23/2019, 9:43 PM

## 2019-06-23 NOTE — ED Notes (Addendum)
Patient 88% on RA. 2 litters [place] placed on patient in triage.  Patient is 94% on 2 litters.

## 2019-06-23 NOTE — ED Triage Notes (Signed)
Patient c/o upper abdominal pain and shob with exertion.  Patient was seen at urgent care and told to come to ED. Patient came to ED lastnight and left lwbs.    87%-90% RA   A/ox4 Ambulatory in triage.

## 2019-06-23 NOTE — ED Provider Notes (Signed)
[facility] DEPT Provider Note   CSN: [id number] Arrival date & time: 06/23/19  1644     History   Chief Complaint Chief Complaint  Patient presents with   Abdominal Pain   Shortness of Breath    with exertion     HPI Carolyn Proctor is a 58 y.o. female.     HPI Patient presents with 3 months of episodic upper abdominal pain.  States the pain has worsened over the last for 5 days.  Pain is worse with ambulation.  Chest endorses shortness of breath with exertion.  She has a nonproductive cough.  Denies nausea or vomiting.  Denies melanotic or grossly bloody stool.  Patient is no longer on anticoagulation. 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   Thrombocytosis ([place]) 09/14/2011    Patient Active Problem List   Diagnosis Date Noted   New onset of congestive heart failure ([place]) 06/23/2019   ILD (interstitial lung disease) ([place]) 06/01/2014   PAF (paroxysmal atrial fibrillation) ([place]) 04/02/2013   Chronic anticoagulation, Xarelto 04/02/2013   Elevated troponin- secondary to Myopericarditis 02/25/2013   Acute myopericarditis 02/24/2013   Atrial fibrillation with RVR ([place]) 02/22/2013   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 2008 -> 2014 wihtout change 02/22/2013   Pleuritic chest pain 02/22/2013   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      Home Medications    Prior to Admission medications   Medication Sig Start Date End Date Taking? Authorizing Provider  acetaminophen (TYLENOL) 325 MG tablet Take 2 tablets (650 mg total) by mouth every 4 (four) hours as needed for pain or fever. 02/25/13   Erlene Quan, PA-C  cholecalciferol (VITAMIN D) 1000 UNITS tablet Take 1,000 Units by mouth daily.    [provider]  colchicine 0.6 MG tablet Take 1 tablet (0.6 mg total) by mouth 2 (two) times daily. 02/25/13   Erlene Quan, PA-C  fexofenadine (ALLEGRA) 180 MG tablet Take 180 mg by mouth daily.    [provider]  folic acid (FOLVITE) 1 MG tablet Take 1 mg by mouth daily.    [provider]  ibuprofen (ADVIL,MOTRIN) 200 MG tablet Take 400 mg by mouth every 6 (six) hours as needed for pain. For pain    [provider]  metoprolol tartrate (LOPRESSOR) 25 MG tablet Take 1 tablet (25 mg total) by mouth 2 (two) times daily. 02/25/13   Erlene Quan, PA-C  Multiple Vitamin (MULTIVITAMIN WITH MINERALS) TABS Take 1 tablet by mouth daily.    [provider]  predniSONE (DELTASONE) 5 MG tablet Take 10 mg by mouth daily.     [provider]  Rivaroxaban (XARELTO) 20 MG TABS Take 1 tablet (20 mg total) by mouth daily. 02/26/13   Kerin Ransom  K, PA-C    Family History Family History  Problem Relation Age of Onset   Lupus Mother    Liver disease Mother    Hypertension Maternal Grandmother    Lupus Brother     Social History Social History   Tobacco Use   Smoking status: Never Smoker   Smokeless tobacco: Never Used  Substance Use Topics   Alcohol use: No   Drug use: No     Allergies   Patient has no known allergies.   Review of Systems Review of Systems  Constitutional: Positive for fatigue. Negative for chills and fever.  HENT: Negative for sore throat and trouble swallowing.   Eyes: Negative  for visual disturbance.  Respiratory: Positive for cough and shortness of breath.   Cardiovascular: Negative for chest pain.  Gastrointestinal: Positive for abdominal pain and constipation. Negative for blood in stool, diarrhea, nausea and vomiting.  Genitourinary: Negative for dysuria, flank pain, frequency and hematuria.  Musculoskeletal: Negative for back pain, myalgias and neck pain.  Skin: Negative for rash and wound.  Neurological: Negative for dizziness, weakness, light-headedness, numbness and headaches.  All other systems reviewed and are negative.    Physical Exam Updated Vital Signs BP 122/86    Pulse 71    Temp 98 F (36.7 C) (Oral)    Resp 20    Ht _0  (1.803 m)    Wt 62.6 kg    LMP 02/19/2013    SpO2 (!) 81%    BMI 19.25 kg/m   Physical Exam Vitals signs and nursing note reviewed.  Constitutional:      General: She is not in acute distress.    Appearance: Normal appearance. She is well-developed. She is not ill-appearing.  HENT:     Head: Normocephalic and atraumatic.     Nose: Nose normal.     Mouth/Throat:     Mouth: Mucous membranes are moist.  Eyes:     Pupils: Pupils are equal, round, and reactive to light.  Neck:     Musculoskeletal: Normal range of motion and neck supple.  Cardiovascular:     Rate and Rhythm: Normal rate and regular rhythm.     Heart sounds: No murmur. No friction rub. No gallop.   Pulmonary:     Effort: Pulmonary effort is normal.     Breath sounds: Rhonchi present.     Comments: Rhonchi at bilateral bases.  No respiratory distress. Chest:     Chest wall: No tenderness.  Abdominal:     General: Bowel sounds are normal.     Palpations: Abdomen is soft. There is no mass.     Tenderness: There is abdominal tenderness. There is no right CVA tenderness, left CVA tenderness, guarding or rebound.     Comments: Epigastric tenderness to palpation.  No rebound or guarding.  Musculoskeletal: Normal range of motion.        General: No  swelling, tenderness, deformity or signs of injury.     Right lower leg: No edema.     Left lower leg: No edema.     Comments: No midline thoracic or lumbar tenderness.  No CVA tenderness.  Distal pulses are 2+.  No calf swelling, asymmetry or tenderness.  Skin:    General: Skin is warm and dry.     Findings: No erythema or rash.  Neurological:     General: No focal deficit present.     Mental Status: She is alert and oriented to person, place, and time.  Psychiatric:  Mood and Affect: Mood normal.        Behavior: Behavior normal.      ED Treatments / Results  Labs (all labs ordered are listed, but only abnormal results are displayed) Labs Reviewed  COMPREHENSIVE METABOLIC PANEL - Abnormal; Notable for the following components:      Result Value   Creatinine, Ser 1.25 (*)    Calcium 8.8 (*)    Total Protein 8.4 (*)    Albumin 2.9 (*)    GFR calc non Af Amer 48 (*)    GFR calc Af Amer 56 (*)    All other components within normal limits  CBC - Abnormal; Notable for the following components:   WBC 11.2 (*)    RDW 16.9 (*)    Platelets 463 (*)    All other components within normal limits  URINALYSIS, ROUTINE W REFLEX MICROSCOPIC - Abnormal; Notable for the following components:   APPearance HAZY (*)    Protein, ur 30 (*)    Nitrite POSITIVE (*)    Bacteria, UA RARE (*)    All other components within normal limits  BRAIN NATRIURETIC PEPTIDE - Abnormal; Notable for the following components:   B Natriuretic Peptide 1,500.6 (*)    All other components within normal limits  D-DIMER, QUANTITATIVE (NOT AT [facility]) - Abnormal; Notable for the following components:   D-Dimer, Quant 2.63 (*)    All other components within normal limits  TROPONIN I (HIGH SENSITIVITY) - Abnormal; Notable for the following components:   Troponin I (High Sensitivity) 55 (*)    All other components within normal limits  SARS CORONAVIRUS 2 (TAT 6-24 HRS)  LIPASE, BLOOD  TROPONIN I (HIGH  SENSITIVITY)    EKG EKG Interpretation  Date/Time:  Tuesday June 23 2019 17:16:24 EDT Ventricular Rate:  101 PR Interval:    QRS Duration: 52 QT Interval:  403 QTC Calculation: 520 R Axis:   -83 Text Interpretation:  Sinus tachycardia Multiform ventricular premature complexes Left atrial enlargement Prolonged QT interval Confirmed by Julianne Rice [phone]) on 06/23/2019 5:39:47 PM   Radiology Dg Chest 2 View  Result Date: 06/23/2019 CLINICAL DATA:  Upper abdominal pain and shortness of breath with exertion. EXAM: CHEST - 2 VIEW COMPARISON:  PA and lateral chest 02/22/2013 and CT chest 05/13/2013. FINDINGS: There is cardiomegaly. Coarsening of the pulmonary interstitium consistent with fibrosis has markedly worsened since the prior examination and appears worst in the lower lobes where it is more severe on the right. No pneumothorax or pleural effusion. No acute or focal bony abnormality. IMPRESSION: No acute disease. Marked progression of pulmonary fibrosis. Cardiomegaly. Electronically Signed   By: Inge Rise M.D.   On: 06/23/2019 18:04   Ct Angio Chest Pe W And/or Wo Contrast  Result Date: 06/23/2019 CLINICAL DATA:  Shortness of breath and positive D-dimer EXAM: CT ANGIOGRAPHY CHEST WITH CONTRAST TECHNIQUE: Multidetector CT imaging of the chest was performed using the standard protocol during bolus administration of intravenous contrast. Multiplanar CT image reconstructions and MIPs were obtained to evaluate the vascular anatomy. CONTRAST:  143m OMNIPAQUE IOHEXOL 350 MG/ML SOLN COMPARISON:  Chest x-ray from earlier in the same day, CT from 05/13/2013. FINDINGS: Cardiovascular: Thoracic aorta demonstrates atherosclerotic calcifications although poor enhancement is noted. No aneurysmal dilatation is seen. Cardiac shadow is enlarged. Mild pericardial effusion is seen measuring 15 mm in greatest dimension at its thickest point. The pulmonary artery is well visualized with a normal  branching pattern. No filling defects to suggest pulmonary emboli  are identified. Mediastinum/Nodes: The thoracic inlet is within normal limits. Stable right paratracheal lymph node is seen. Pre-vascular and AP window lymph nodes are seen slightly more prominent than that noted on the prior exam. No sizable hilar adenopathy is noted. The esophagus as visualized is within normal limits. Lungs/Pleura: Diffuse emphysematous changes are noted with multiple subpleural bowl identified. Underlying fibrotic changes are noted particularly in the lower lobes bilaterally slightly progressed when compared with the prior exam of 2014. Generalized increased density of lung parenchyma is noted likely related to underlying edema. No focal confluent infiltrate is seen. No sizable parenchymal nodule is noted. No effusion or pneumothorax is seen. Upper Abdomen: Covered in depth on the CT of the abdomen and pelvis. Musculoskeletal: Degenerative changes of the thoracic spine are seen. No acute bony abnormality is noted. Review of the MIP images confirms the above findings. IMPRESSION: No evidence of pulmonary emboli are identified. New pericardial effusion as described. Significant fibrotic changes within the lungs bilaterally progressed in the interval from the prior exam of 2014. Some superimposed edema is noted as well. No focal confluent infiltrate is seen. Slight progression in reactive lymph nodes in the AP window and prevascular region when compared with the prior study. Aortic Atherosclerosis (ICD10-I70.0) and Emphysema (ICD10-J43.9). Electronically Signed   By: Inez Catalina M.D.   On: 06/23/2019 20:23   Ct Abdomen Pelvis W Contrast  Result Date: 06/23/2019 CLINICAL DATA:  Abdominal pain, shortness of breath with exertion EXAM: CT ABDOMEN AND PELVIS WITH CONTRAST TECHNIQUE: Multidetector CT imaging of the abdomen and pelvis was performed using the standard protocol following bolus administration of intravenous contrast.  CONTRAST:  140m OMNIPAQUE IOHEXOL 350 MG/ML SOLN COMPARISON:  CT abdomen pelvis 07/12/2012 FINDINGS: Lower chest: Redemonstrated fibrotic interstitial changes of the lungs likely with superimposed edema cardiomegaly with pericardial effusion. Dedicated CT of the chest was performed. Please see dedicated report. Hepatobiliary: No focal liver abnormality is seen. Small volume pericholecystic fluid. No gallstones, gallbladder wall thickening, or biliary dilatation. Pancreas: Unremarkable. No pancreatic ductal dilatation or surrounding inflammatory changes. Spleen: Normal in size without focal abnormality. Adrenals/Urinary Tract: Adrenal glands are unremarkable. Kidneys are normal, without renal calculi, focal lesion, or hydronephrosis. Circumferential bladder wall thickening. Stomach/Bowel: Distal esophagus, stomach and duodenal sweep are unremarkable. No bowel wall thickening or dilatation. No evidence of obstruction. A normal appendix is visualized. Scattered colonic diverticula without focal pericolonic inflammation to suggest diverticulitis. Vascular/Lymphatic: The aorta is normal caliber. No suspicious or enlarged lymph nodes in the included lymphatic chains. Reproductive: Anteverted uterus. No concerning adnexal lesions. Other: Trace simple free fluid layering in the deep pelvis. Mild body wall edema. No bowel containing hernia. No free air or organized collections. Musculoskeletal: Multilevel degenerative changes are present in the imaged portions of the spine. No acute or concerning osseous lesions. IMPRESSION: 1. Fibrotic interstitial changes of the lungs with some possible superimposed edema as well as cardiomegaly with effusion. Please see report of the dedicated CT angiography of the chest performed the same day. 2. Circumferential bladder wall thickening, which could reflect cystitis. Correlate with urinalysis. 3. Trace simple free fluid layering in the deep pelvis, and mild body wall edema, suggestive of  volume overload. 4. Mild pericholecystic fluid without other findings of acute cholecystitis. Nonspecific in the setting of additional free intraperitoneal fluid. Correlate with abdominal symptoms and consider right upper quadrant ultrasound if there are supportive clinical features. Electronically Signed   By: PLovena LeM.D.   On: 06/23/2019 20:21   UK[place]Abdomen  Limited  Result Date: 06/23/2019 CLINICAL DATA:  Upper abdominal pain for 2-3 weeks. EXAM: ULTRASOUND ABDOMEN LIMITED RIGHT UPPER QUADRANT COMPARISON:  None. FINDINGS: Gallbladder: No gallstones or wall thickening visualized. Trace amount of pericholecystic fluid is seen. No sonographic Murphy sign noted by sonographer. Common bile duct: Diameter: 0.5 cm Liver: No focal lesion identified. Within normal limits in parenchymal echogenicity. Portal vein is patent on color Doppler imaging with normal direction of blood flow towards the liver. Other: None. IMPRESSION: Negative for gallstones. Trace amount of pericholecystic fluid is nonspecific and may be due to volume overload. Electronically Signed   By: Inge Rise M.D.   On: 06/23/2019 18:58    Procedures Procedures (including critical care time)  Medications Ordered in ED Medications  sodium chloride (PF) 0.9 % injection (has no administration in time range)  furosemide (LASIX) injection 40 mg (has no administration in time range)  furosemide (LASIX) injection 40 mg (has no administration in time range)  sodium chloride flush (NS) 0.9 % injection 3 mL (3 mLs Intravenous Given 06/23/19 1803)  sodium chloride 0.9 % bolus 500 mL (500 mLs Intravenous New Bag/Given 06/23/19 2032)  iohexol (OMNIPAQUE) 350 MG/ML injection 100 mL (100 mLs Intravenous Contrast Given 06/23/19 1948)     Initial Impression / Assessment and Plan / ED Course  I have reviewed the triage vital signs and the nursing notes.  Pertinent labs & imaging results that were available during my care of the patient were  reviewed by me and considered in my medical decision making (see chart for details).        Patient requiring supplemental oxygen to maintain saturations in the 90s.  Elevated d-dimer.  CT angio with no evidence of PE.  Patient does have fibrotic changes to the lung with likely edema.  Patient with small pericardial effusion.  Elevated BNP.  Given low-dose Lasix.  No definite evidence of cholecystitis.  Discussed with hospitalist who will see patient in the emergency department and admit.  Final Clinical Impressions(s) / ED Diagnoses   Final diagnoses:  Hypervolemia, unspecified hypervolemia type  Calculus of gallbladder without cholecystitis without obstruction    ED Discharge Orders    None       Julianne Rice, MD 06/23/19 2123

## 2019-06-24 ENCOUNTER — Observation Stay (HOSPITAL_COMMUNITY): Payer: BC Managed Care – PPO

## 2019-06-24 DIAGNOSIS — Z20828 Contact with and (suspected) exposure to other viral communicable diseases: Secondary | ICD-10-CM | POA: Diagnosis present

## 2019-06-24 DIAGNOSIS — I480 Paroxysmal atrial fibrillation: Secondary | ICD-10-CM | POA: Diagnosis present

## 2019-06-24 DIAGNOSIS — J8410 Pulmonary fibrosis, unspecified: Secondary | ICD-10-CM | POA: Diagnosis present

## 2019-06-24 DIAGNOSIS — I4581 Long QT syndrome: Secondary | ICD-10-CM | POA: Diagnosis present

## 2019-06-24 DIAGNOSIS — M0510 Rheumatoid lung disease with rheumatoid arthritis of unspecified site: Secondary | ICD-10-CM | POA: Diagnosis not present

## 2019-06-24 DIAGNOSIS — Z7901 Long term (current) use of anticoagulants: Secondary | ICD-10-CM | POA: Diagnosis not present

## 2019-06-24 DIAGNOSIS — K828 Other specified diseases of gallbladder: Secondary | ICD-10-CM | POA: Diagnosis present

## 2019-06-24 DIAGNOSIS — R0602 Shortness of breath: Secondary | ICD-10-CM | POA: Diagnosis present

## 2019-06-24 DIAGNOSIS — J8489 Other specified interstitial pulmonary diseases: Secondary | ICD-10-CM | POA: Diagnosis present

## 2019-06-24 DIAGNOSIS — Z7952 Long term (current) use of systemic steroids: Secondary | ICD-10-CM | POA: Diagnosis not present

## 2019-06-24 DIAGNOSIS — M329 Systemic lupus erythematosus, unspecified: Secondary | ICD-10-CM | POA: Diagnosis present

## 2019-06-24 DIAGNOSIS — I371 Nonrheumatic pulmonary valve insufficiency: Secondary | ICD-10-CM

## 2019-06-24 DIAGNOSIS — I313 Pericardial effusion (noninflammatory): Secondary | ICD-10-CM | POA: Diagnosis present

## 2019-06-24 DIAGNOSIS — J849 Interstitial pulmonary disease, unspecified: Secondary | ICD-10-CM

## 2019-06-24 DIAGNOSIS — I5032 Chronic diastolic (congestive) heart failure: Secondary | ICD-10-CM | POA: Diagnosis present

## 2019-06-24 DIAGNOSIS — M0519 Rheumatoid lung disease with rheumatoid arthritis of multiple sites: Secondary | ICD-10-CM | POA: Diagnosis present

## 2019-06-24 DIAGNOSIS — Z8379 Family history of other diseases of the digestive system: Secondary | ICD-10-CM | POA: Diagnosis not present

## 2019-06-24 DIAGNOSIS — M65841 Other synovitis and tenosynovitis, right hand: Secondary | ICD-10-CM | POA: Diagnosis present

## 2019-06-24 DIAGNOSIS — Z79899 Other long term (current) drug therapy: Secondary | ICD-10-CM | POA: Diagnosis not present

## 2019-06-24 DIAGNOSIS — I361 Nonrheumatic tricuspid (valve) insufficiency: Secondary | ICD-10-CM

## 2019-06-24 DIAGNOSIS — Z8679 Personal history of other diseases of the circulatory system: Secondary | ICD-10-CM | POA: Diagnosis not present

## 2019-06-24 DIAGNOSIS — Z8249 Family history of ischemic heart disease and other diseases of the circulatory system: Secondary | ICD-10-CM | POA: Diagnosis not present

## 2019-06-24 DIAGNOSIS — J9601 Acute respiratory failure with hypoxia: Secondary | ICD-10-CM | POA: Diagnosis present

## 2019-06-24 DIAGNOSIS — Z8269 Family history of other diseases of the musculoskeletal system and connective tissue: Secondary | ICD-10-CM | POA: Diagnosis not present

## 2019-06-24 DIAGNOSIS — J439 Emphysema, unspecified: Secondary | ICD-10-CM | POA: Diagnosis present

## 2019-06-24 DIAGNOSIS — I110 Hypertensive heart disease with heart failure: Secondary | ICD-10-CM | POA: Diagnosis present

## 2019-06-24 DIAGNOSIS — M20031 Swan-neck deformity of right finger(s): Secondary | ICD-10-CM | POA: Diagnosis present

## 2019-06-24 LAB — BASIC METABOLIC PANEL
Anion gap: 8 (ref 5–15)
BUN: 18 mg/dL (ref 6–20)
CO2: 21 mmol/L — ABNORMAL LOW (ref 22–32)
Calcium: 8.8 mg/dL — ABNORMAL LOW (ref 8.9–10.3)
Chloride: 108 mmol/L (ref 98–111)
Creatinine, Ser: 1.2 mg/dL — ABNORMAL HIGH (ref 0.44–1.00)
GFR calc Af Amer: 59 mL/min — ABNORMAL LOW (ref >60)
GFR calc non Af Amer: 51 mL/min — ABNORMAL LOW (ref >60)
Glucose, Bld: 83 mg/dL (ref 70–99)
Potassium: 3.8 mmol/L (ref 3.5–5.1)
Sodium: 137 mmol/L (ref 135–145)

## 2019-06-24 LAB — SARS CORONAVIRUS 2 (TAT 6-24 HRS): SARS Coronavirus 2: NEGATIVE

## 2019-06-24 LAB — ECHOCARDIOGRAM COMPLETE
Height: 71 in
Weight: 2179.2 oz

## 2019-06-24 MED ORDER — PNEUMOCOCCAL VAC POLYVALENT 25 MCG/0.5ML IJ INJ
0.5000 mL | INJECTION | INTRAMUSCULAR | Status: DC
  Filled 2019-06-24: qty 0.5

## 2019-06-24 MED ORDER — PANTOPRAZOLE SODIUM 40 MG PO TBEC
40.0000 mg | DELAYED_RELEASE_TABLET | Freq: Every day | ORAL | Status: DC
  Administered 2019-06-24 – 2019-06-26 (×3): 40 mg via ORAL
  Filled 2019-06-24 (×3): qty 1

## 2019-06-24 MED ORDER — PREDNISONE 20 MG PO TABS
20.0000 mg | ORAL_TABLET | Freq: Every day | ORAL | Status: DC
  Administered 2019-06-25 – 2019-06-26 (×2): 20 mg via ORAL
  Filled 2019-06-24 (×2): qty 1

## 2019-06-24 NOTE — Progress Notes (Signed)
  Echocardiogram 2D Echocardiogram has been performed.  Carolyn Proctor 06/24/2019, 9:33 AM

## 2019-06-24 NOTE — ED Notes (Signed)
ED TO INPATIENT HANDOFF REPORT  ED Nurse Name and Phone #: Amilio Zehnder [phone]  S Name/Age/Gender Carolyn Proctor 58 y.o. female Room/Bed: WA23/WA23  Code Status   Code Status: Full Code  Home/SNF/Other Home Patient oriented to: self, place, time and situation Is this baseline? Yes   Triage Complete: Triage complete  Chief Complaint Abd Pain, Head Pain, Leg Pain  Triage Note Patient c/o upper abdominal pain and shob with exertion.  Patient was seen at urgent care and told to come to ED. Patient came to ED lastnight and left lwbs.    87%-90% RA   A/ox4 Ambulatory in triage.    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 Atrial fibrillation ([place])   . Chronic anticoagulation, Xarelto 04/02/2013  . Diverticulitis   . Hypertension   . Lupus ([place]) 09/14/2011  . 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 Thrombocytosis ([place]) 09/14/2011   Past Surgical History:  Procedure Laterality Date  . BONE MARROW BIOPSY  2010  . CARDIAC CATHETERIZATION    . LEFT HEART CATHETERIZATION WITH CORONARY ANGIOGRAM N/A 02/24/2013   Procedure: LEFT HEART CATHETERIZATION WITH CORONARY ANGIOGRAM;  Surgeon: Leonie Man, MD;  Location: [facility] CATH LAB;  Service: Cardiovascular;  Laterality: N/A;     A IV Location/Drains/Wounds Patient Lines/Drains/Airways Status   Active Line/Drains/Airways    Name:    Placement date:   Placement time:   Site:   Days:   Peripheral IV 06/23/19 Left Antecubital   06/23/19    1802    Antecubital   1          Intake/Output Last 24 hours  Intake/Output Summary (Last 24 hours) at 06/24/2019 0251 Last data filed at 06/23/2019 2115 Gross per 24 hour  Intake 498 ml  Output -  Net 498 ml    Labs/Imaging Results for orders placed or performed during the hospital encounter of 06/23/19 (from the past 48 hour(s))  SARS CORONAVIRUS 2 (TAT 6-24 HRS) Nasopharyngeal Nasopharyngeal Swab     Status: None   Collection Time: 06/23/19  5:52 PM   Specimen: Nasopharyngeal Swab  Result Value Ref Range   SARS Coronavirus 2 NEGATIVE NEGATIVE    Comment: (NOTE) SARS-CoV-2 target nucleic acids are NOT DETECTED. The SARS-CoV-2 RNA is generally detectable in upper and lower respiratory specimens during the acute phase of infection. Negative results do not preclude SARS-CoV-2 infection, do not rule out co-infections with other pathogens, and should not be used as the sole basis for treatment or other patient management decisions. Negative results must be combined with clinical observations, patient history, and epidemiological information. The expected result is Negative. Fact Sheet for Patients: SugarRoll.be Fact Sheet for Healthcare Providers: https://www.woods-mathews.com/ This test is not yet approved or cleared by the [place] FDA and  has been authorized for detection and/or diagnosis of SARS-CoV-2 by FDA under an Emergency Use Authorization (EUA). This EUA will remain  in effect (  meaning this test can be used) for the duration of the COVID-19 declaration under Section 56 4(b)(1) of the Act, 21 U.S.C. section 360bbb-3(b)(1), unless the authorization is terminated or revoked sooner. Performed at [facility], [place] [address] [place], [place] [address]   Comprehensive metabolic panel     Status: Abnormal    Collection Time: 06/23/19  6:00 PM  Result Value Ref Range   Sodium 139 135 - 145 mmol/L   Potassium 4.0 3.5 - 5.1 mmol/L   Chloride 108 98 - 111 mmol/L   CO2 22 22 - 32 mmol/L   Glucose, Bld 88 70 - 99 mg/dL   BUN 17 6 - 20 mg/dL   Creatinine, Ser 1.25 (H) 0.44 - 1.00 mg/dL   Calcium 8.8 (L) 8.9 - 10.3 mg/dL   Total Protein 8.4 (H) 6.5 - 8.1 g/dL   Albumin 2.9 (L) 3.5 - 5.0 g/dL   AST 35 15 - 41 U/L   ALT 18 0 - 44 U/L   Alkaline Phosphatase 61 38 - 126 U/L   Total Bilirubin 0.6 0.3 - 1.2 mg/dL   GFR calc non Af Amer 48 (L) >60 mL/min   GFR calc Af Amer 56 (L) >60 mL/min   Anion gap 9 5 - 15    Comment: Performed at [facility], [place] [address], [place], [place] [address]  CBC     Status: Abnormal   Collection Time: 06/23/19  6:00 PM  Result Value Ref Range   WBC 11.2 (H) 4.0 - 10.5 K/uL   RBC 4.27 3.87 - 5.11 MIL/uL   Hemoglobin 12.3 12.0 - 15.0 g/dL   HCT 41.0 36.0 - 46.0 %   MCV 96.0 80.0 - 100.0 fL   MCH 28.8 26.0 - 34.0 pg   MCHC 30.0 30.0 - 36.0 g/dL   RDW 16.9 (H) 11.5 - 15.5 %   Platelets 463 (H) 150 - 400 K/uL   nRBC 0.0 0.0 - 0.2 %    Comment: Performed at [facility], [place] [address], [place], [place] [address]  Lipase, blood     Status: None   Collection Time: 06/23/19  6:00 PM  Result Value Ref Range   Lipase 25 11 - 51 U/L    Comment: Performed at [facility], [place] [address] [place], [place] [address]  Troponin I (High Sensitivity)     Status: Abnormal   Collection Time: 06/23/19  6:00 PM  Result Value Ref Range   Troponin I (High Sensitivity) 55 (H) <18 ng/L    Comment: (NOTE) Elevated high sensitivity troponin I (hsTnI) values and significant  changes across serial measurements may suggest ACS but many other  chronic and acute conditions are known to elevate hsTnI results.  Refer to the "Links" section for chest pain algorithms and additional  guidance. Performed at [facility], [place] [address], [place], [place] [address]   Brain natriuretic peptide     Status: Abnormal   Collection Time: 06/23/19  6:00 PM  Result Value Ref Range   B Natriuretic Peptide 1,500.6 (H) 0.0 - 100.0 pg/mL    Comment: Performed at [facility], [place] [address], [place], [place] [address]  D-dimer, quantitative (not at [facility])     Status: Abnormal   Collection Time: 06/23/19  6:00 PM  Result Value Ref Range   D-Dimer, Quant 2.63 (H) 0.00 - 0.50 ug/mL-FEU    Comment: (NOTE) At the manufacturer cut-off of  0.50 ug/mL FEU, this assay has been documented to exclude PE with a sensitivity and negative predictive value of 97 to 99%.  At this time, this assay has not been approved by the FDA to exclude DVT/VTE. Results should be correlated with clinical presentation. Performed at [facility], [place] [address], [place], [place] [address]   Urinalysis, Routine w reflex microscopic     Status: Abnormal   Collection Time: 06/23/19  8:37 PM  Result Value Ref Range   Color, Urine YELLOW YELLOW   APPearance HAZY (A) CLEAR   Specific Gravity, Urine 1.019 1.005 - 1.030   pH 6.0 5.0 - 8.0   Glucose, UA NEGATIVE NEGATIVE mg/dL   Hgb urine dipstick NEGATIVE NEGATIVE   Bilirubin Urine NEGATIVE NEGATIVE   Ketones, ur NEGATIVE NEGATIVE mg/dL   Protein, ur 30 (A) NEGATIVE mg/dL   Nitrite POSITIVE (A) NEGATIVE   Leukocytes,Ua NEGATIVE NEGATIVE   RBC / HPF 0-5 0 - 5 RBC/hpf   WBC, UA 0-5 0 - 5 WBC/hpf   Bacteria, UA RARE (A) NONE SEEN   Squamous Epithelial / LPF 0-5 0 - 5   Mucus PRESENT     Comment: Performed at [facility], [place] [address] [place], [place] [address]  Troponin I (High Sensitivity)     Status: Abnormal   Collection Time: 06/23/19  8:37 PM  Result Value Ref Range   Troponin I (High Sensitivity) 67 (H) <18 ng/L    Comment: (NOTE) Elevated high sensitivity troponin I (hsTnI) values and significant  changes across  serial measurements may suggest ACS but many other  chronic and acute conditions are known to elevate hsTnI results.  Refer to the "Links" section for chest pain algorithms and additional  guidance. Performed at [facility], [place] [address], [place], [place] [address]    Dg Chest 2 View  Result Date: 06/23/2019 CLINICAL DATA:  Upper abdominal pain and shortness of breath with exertion. EXAM: CHEST - 2 VIEW COMPARISON:  PA and lateral chest 02/22/2013 and CT chest 05/13/2013. FINDINGS: There is cardiomegaly. Coarsening of the pulmonary interstitium consistent with fibrosis has markedly worsened since the prior examination and appears worst in the lower lobes where it is more severe on the right. No pneumothorax or pleural effusion. No acute or focal bony abnormality. IMPRESSION: No acute disease. Marked progression of pulmonary fibrosis. Cardiomegaly. Electronically Signed   By: Inge Rise M.D.   On: 06/23/2019 18:04   Ct Angio Chest Pe W And/or Wo Contrast  Result Date: 06/23/2019 CLINICAL DATA:  Shortness of breath and positive D-dimer EXAM: CT ANGIOGRAPHY CHEST WITH CONTRAST TECHNIQUE: Multidetector CT imaging of the chest was performed using the standard protocol during bolus administration of intravenous contrast. Multiplanar CT image reconstructions and MIPs were obtained to evaluate the vascular anatomy. CONTRAST:  192m OMNIPAQUE IOHEXOL 350 MG/ML SOLN COMPARISON:  Chest x-ray from earlier in the same day, CT from 05/13/2013. FINDINGS: Cardiovascular: Thoracic aorta demonstrates atherosclerotic calcifications although poor enhancement is noted. No aneurysmal dilatation is seen. Cardiac shadow is enlarged. Mild pericardial effusion is seen measuring 15 mm in greatest dimension at its thickest point. The pulmonary artery is well visualized with a normal branching pattern. No filling defects to suggest pulmonary emboli are identified. Mediastinum/Nodes: The thoracic inlet  is within normal limits. Stable right paratracheal lymph node is seen. Pre-vascular and AP window lymph nodes are seen slightly more prominent than that noted on the prior exam. No sizable hilar adenopathy is  noted. The esophagus as visualized is within normal limits. Lungs/Pleura: Diffuse emphysematous changes are noted with multiple subpleural bowl identified. Underlying fibrotic changes are noted particularly in the lower lobes bilaterally slightly progressed when compared with the prior exam of 2014. Generalized increased density of lung parenchyma is noted likely related to underlying edema. No focal confluent infiltrate is seen. No sizable parenchymal nodule is noted. No effusion or pneumothorax is seen. Upper Abdomen: Covered in depth on the CT of the abdomen and pelvis. Musculoskeletal: Degenerative changes of the thoracic spine are seen. No acute bony abnormality is noted. Review of the MIP images confirms the above findings. IMPRESSION: No evidence of pulmonary emboli are identified. New pericardial effusion as described. Significant fibrotic changes within the lungs bilaterally progressed in the interval from the prior exam of 2014. Some superimposed edema is noted as well. No focal confluent infiltrate is seen. Slight progression in reactive lymph nodes in the AP window and prevascular region when compared with the prior study. Aortic Atherosclerosis (ICD10-I70.0) and Emphysema (ICD10-J43.9). Electronically Signed   By: Inez Catalina M.D.   On: 06/23/2019 20:23   Ct Abdomen Pelvis W Contrast  Result Date: 06/23/2019 CLINICAL DATA:  Abdominal pain, shortness of breath with exertion EXAM: CT ABDOMEN AND PELVIS WITH CONTRAST TECHNIQUE: Multidetector CT imaging of the abdomen and pelvis was performed using the standard protocol following bolus administration of intravenous contrast. CONTRAST:  1104m OMNIPAQUE IOHEXOL 350 MG/ML SOLN COMPARISON:  CT abdomen pelvis 07/12/2012 FINDINGS: Lower chest:  Redemonstrated fibrotic interstitial changes of the lungs likely with superimposed edema cardiomegaly with pericardial effusion. Dedicated CT of the chest was performed. Please see dedicated report. Hepatobiliary: No focal liver abnormality is seen. Small volume pericholecystic fluid. No gallstones, gallbladder wall thickening, or biliary dilatation. Pancreas: Unremarkable. No pancreatic ductal dilatation or surrounding inflammatory changes. Spleen: Normal in size without focal abnormality. Adrenals/Urinary Tract: Adrenal glands are unremarkable. Kidneys are normal, without renal calculi, focal lesion, or hydronephrosis. Circumferential bladder wall thickening. Stomach/Bowel: Distal esophagus, stomach and duodenal sweep are unremarkable. No bowel wall thickening or dilatation. No evidence of obstruction. A normal appendix is visualized. Scattered colonic diverticula without focal pericolonic inflammation to suggest diverticulitis. Vascular/Lymphatic: The aorta is normal caliber. No suspicious or enlarged lymph nodes in the included lymphatic chains. Reproductive: Anteverted uterus. No concerning adnexal lesions. Other: Trace simple free fluid layering in the deep pelvis. Mild body wall edema. No bowel containing hernia. No free air or organized collections. Musculoskeletal: Multilevel degenerative changes are present in the imaged portions of the spine. No acute or concerning osseous lesions. IMPRESSION: 1. Fibrotic interstitial changes of the lungs with some possible superimposed edema as well as cardiomegaly with effusion. Please see report of the dedicated CT angiography of the chest performed the same day. 2. Circumferential bladder wall thickening, which could reflect cystitis. Correlate with urinalysis. 3. Trace simple free fluid layering in the deep pelvis, and mild body wall edema, suggestive of volume overload. 4. Mild pericholecystic fluid without other findings of acute cholecystitis. Nonspecific in the  setting of additional free intraperitoneal fluid. Correlate with abdominal symptoms and consider right upper quadrant ultrasound if there are supportive clinical features. Electronically Signed   By: PLovena LeM.D.   On: 06/23/2019 20:21   [place]Abdomen Limited  Result Date: 06/23/2019 CLINICAL DATA:  Upper abdominal pain for 2-3 weeks. EXAM: ULTRASOUND ABDOMEN LIMITED RIGHT UPPER QUADRANT COMPARISON:  None. FINDINGS: Gallbladder: No gallstones or wall thickening visualized. Trace amount of pericholecystic fluid is seen. No  sonographic Percell Miller sign noted by sonographer. Common bile duct: Diameter: 0.5 cm Liver: No focal lesion identified. Within normal limits in parenchymal echogenicity. Portal vein is patent on color Doppler imaging with normal direction of blood flow towards the liver. Other: None. IMPRESSION: Negative for gallstones. Trace amount of pericholecystic fluid is nonspecific and may be due to volume overload. Electronically Signed   By: Inge Rise M.D.   On: 06/23/2019 18:58    Pending Labs Unresulted Labs (From admission, onward)    Start     Ordered   06/24/19 5102  Basic metabolic panel  Daily,   R     06/23/19 2123   06/23/19 2118  HIV antibody (Routine Testing)  Once,   STAT     06/23/19 2123          Vitals/Pain Today's Vitals   06/24/19 0030 06/24/19 0100 06/24/19 0130 06/24/19 0200  BP: 117/82 114/71 110/68 113/73  Pulse:   86 86  Resp: (!) 22 (!) 23 (!) 23 (!) 21  Temp:      TempSrc:      SpO2:   100% 100%  Weight:      Height:      PainSc:        Isolation Precautions No active isolations  Medications Medications  sodium chloride (PF) 0.9 % injection (has no administration in time range)  furosemide (LASIX) injection 40 mg (has no administration in time range)  sodium chloride flush (NS) 0.9 % injection 3 mL (has no administration in time range)  sodium chloride flush (NS) 0.9 % injection 3 mL (has no administration in time range)  0.9 %  sodium  chloride infusion (has no administration in time range)  acetaminophen (TYLENOL) tablet 650 mg (has no administration in time range)  ondansetron (ZOFRAN) injection 4 mg (has no administration in time range)  enoxaparin (LOVENOX) injection 40 mg (has no administration in time range)  sodium chloride flush (NS) 0.9 % injection 3 mL (3 mLs Intravenous Given 06/23/19 1803)  sodium chloride 0.9 % bolus 500 mL (0 mLs Intravenous Stopped 06/23/19 2115)  iohexol (OMNIPAQUE) 350 MG/ML injection 100 mL (100 mLs Intravenous Contrast Given 06/23/19 1948)  furosemide (LASIX) injection 40 mg (40 mg Intravenous Given 06/23/19 2235)    Mobility walks Low fall risk   Focused Assessments NA   R Recommendations: See Admitting Provider Note  Report given to:   Additional Notes: NA

## 2019-06-24 NOTE — Consult Note (Addendum)
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 PCCM consulted for ILD management  History of present illness   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 In the ED, she was found to be hypoxemic to 88% on RA at rest. CTA was negative for PE but demonstrated interval worsening of fibrotic lung disease and pulmonary edema and small pericardial effusion. BNP elevated to 1500. She was treated with lasix and admitted to Hospitalist service for acute hypoxemic respiratory failure. Pulmonary consulted.  She reports that she has had sinus issues since June but only in the last month has she had more noticeable shortness of breath with exertion. Her abdominal pain worsens her dyspnea and she feels as if she could not move due to her breathing and pain. She has never taken inhalers. She was previously followed by Dr. Chase Caller in ILD clinic with last visit in 05/2014. She had been asymptomatic from a pulmonary standpoint and was scheduled for annual PFTs to monitor for changes. However she was lost to follow-up. She previously was seen by Dr. Estanislado Pandy in Rheumatology and was on Plaquenil and Prednisone but no longer taking anything and has not seen rheumatologist in a few years. She does have progressive joint pain and changes in her hands and toes. Denies wheezing, cough, unexplained fevers, chills, weight loss, night sweats, chest pain.  Past Medical History  Rheumatoid arthritis RA-Interstitial lung disease Atrial fibrillation on anticoagulation Hx myocarditis  Significant Hospital Events   9/16 Admitted   Consults:  PCCM  Procedures:    Significant Diagnostic Tests:  06/23/19 CTA - No PE, mild pericardial effusion, diffuse emphysema, bibasilar pulmonary edema and effusion. Significant progressive subpleural fibrotic changes including honeycombing, diffuse involvement but predominantly worse at the lung bases 06/23/19 CT A/P - Mild pericholecystic fluid, trace simple free fluid in deep pelvis and mild body wall edema, circumferential bladder wall thickening  Micro Data:    Antimicrobials:   Interim history/subjective:  As above  Objective   Blood pressure (!) 131/93, pulse 84, temperature 97.8 F (36.6 C), temperature source Oral, resp. rate (!) 22, height _0  (1.803 m), weight 61.8 kg, last menstrual period 02/19/2013, SpO2 99 %.        Intake/Output Summary (Last 24 hours) at 06/24/2019 1304 Last data filed at 06/24/2019 1158 Gross per 24 hour  Intake 498 ml  Output 500 ml  Net -2 ml   Filed Weights   06/23/19 2038 06/24/19 0340  Weight: 62.6 kg 61.8 kg   Physical Exam: General: Thin-appearing, no acute distress HENT: [place], AT, OP clear, MMM Eyes: EOMI, no scleral icterus Respiratory: Fine bibasilar crackles Cardiovascular: RRR, -M/R/G, no JVD GI: BS+, soft, nontender Extremities: Left 3rd digit with metacarpophalangeal swelling and right 2nd and 3rd proximal interphalangeal joint, ulnar deviation of hands and feet bilaterally Neuro: AAO x4, CNII-XII grossly intact Skin: Intact, no rashes or bruising Psych: Normal mood, normal affect  Assessment & Plan:   58 year old female never smoker with RA-ILD with interval development of shortness of breath associated progressive fibrotic changes seen on CT suggestive of fibrotic  NSIP or UIP in setting of paraseptal emphysema. Currently not on any medications for RA. She will likely benefit from steroid therapy (will do low-dose due to intolerance with agitation in the past). She will need to evaluated as an outpatient for  immunosuppressant therapy for long-term management. No anti-fibrotic agents are indicated in the acute setting.  --Continue supplemental oxygen for target SpO2 88-92%. Prior to discharge she will need ambulatory O2 to determine home oxygen needs --Start prednisone 20 mg daily with plan for taper. --Await echocardiogram results --Agree with diuresis --Start PPI --PRN albuterol for shortness of breath or wheezing  Pulmonary will arrange outpatient follow-up with Rodman Pickle on 9/30 @ 0900. We will continue to follow.  Labs   CBC: Recent Labs  Lab 06/23/19 1800  WBC 11.2*  HGB 12.3  HCT 41.0  MCV 96.0  PLT 463*    Basic Metabolic Panel: Recent Labs  Lab 06/23/19 1800 06/24/19 0438  NA 139 137  K 4.0 3.8  CL 108 108  CO2 22 21*  GLUCOSE 88 83  BUN 17 18  CREATININE 1.25* 1.20*  CALCIUM 8.8* 8.8*   GFR: Estimated Creatinine Clearance: 51.7 mL/min (A) (by C-G formula based on SCr of 1.2 mg/dL (H)). Recent Labs  Lab 06/23/19 1800  WBC 11.2*    Liver Function Tests: Recent Labs  Lab 06/23/19 1800  AST 35  ALT 18  ALKPHOS 61  BILITOT 0.6  PROT 8.4*  ALBUMIN 2.9*   Recent Labs  Lab 06/23/19 1800  LIPASE 25   No results for input(s): AMMONIA in the last 168 hours.  ABG    Component Value Date/Time   HCO3 22.9 07/17/2007 1246   TCO2 24 07/17/2007 1246     Coagulation Profile: No results for input(s): INR, PROTIME in the last 168 hours.  Cardiac Enzymes: No results for input(s): CKTOTAL, CKMB, CKMBINDEX, TROPONINI in the last 168 hours.  HbA1C: Hgb A1c MFr Bld  Date/Time Value Ref Range Status  07/17/2007 02:46 PM   Final   5.3 (NOTE)   The ADA recommends the following therapeutic goals for glycemic   control related to Hgb A1C measurement:   Goal of Therapy:   < 7.0% Hgb A1C   Action Suggested:  > 8.0% Hgb A1C   Ref:  Diabetes Care, 22, Suppl. 1, 1999    CBG: No results for input(s): GLUCAP in the last 168 hours.  Review of Systems:    Review of Systems  Constitutional: Positive for malaise/fatigue. Negative for chills, diaphoresis, fever and weight loss.  HENT: Negative for congestion, ear pain and sore throat.   Respiratory: Positive for shortness of breath. Negative for cough, hemoptysis, sputum production and wheezing.   Cardiovascular: Negative for chest pain, palpitations and leg swelling.  Gastrointestinal: Positive for abdominal pain. Negative for heartburn and nausea.  Musculoskeletal: Positive for joint pain. Negative for myalgias.  Skin: Negative for itching and rash.  Neurological: Negative for dizziness, weakness and headaches.  Endo/Heme/Allergies: Does not bruise/bleed easily.     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   Surgical History    Past Surgical History:  Procedure Laterality Date  . BONE MARROW BIOPSY  2010  . CARDIAC CATHETERIZATION    . LEFT HEART CATHETERIZATION WITH CORONARY ANGIOGRAM N/A 02/24/2013   Procedure: LEFT HEART CATHETERIZATION WITH CORONARY ANGIOGRAM;  Surgeon: Shanon Brow  Loren Racer, MD;  Location: [facility] CATH LAB;  Service: Cardiovascular;  Laterality: N/A;     Social History   reports that she has never smoked. She has never used smokeless tobacco. She reports that she does not drink alcohol or use drugs.   Family History   Her family history includes Hypertension in her maternal grandmother; Liver disease in her mother; Lupus in her brother and mother.   Allergies No Known Allergies   Home Medications  Prior to Admission medications   Medication Sig Start Date End Date Taking? Authorizing Provider  cetirizine (ZYRTEC) 10 MG tablet Take 10 mg by mouth daily.   Yes [provider]  cholecalciferol (VITAMIN D) 1000 UNITS tablet Take 3,000 Units by mouth  daily.    Yes [provider]  Cyanocobalamin (VITAMIN B-12) 1000 MCG SUBL Place 1,000 mcg under the tongue.   Yes [provider]  naproxen sodium (ALEVE) 220 MG tablet Take 220 mg by mouth 2 (two) times daily as needed (pain).   Yes [provider]     Care time: 85 minutes    Rodman Pickle, M.D. [facility]monary[facility] 06/24/2019 2:04 PM

## 2019-06-24 NOTE — ED Notes (Signed)
Asked attending if pt needed lovenox now or can it wait until morning meds. He said it is DVT prophylaxis and can wait.

## 2019-06-24 NOTE — Progress Notes (Signed)
PROGRESS NOTE    Carolyn Proctor  [id number] DOB: [birth year] DOA: 06/23/2019 PCP: Glendale Chard, MD    Brief Narrative:   Carolyn Proctor is a 57 y.o. female with medical history significant of PAF, not on chronic anticoagulation anymore, HTN, RA with rheumatic ILD that previously had been asymptomatic and stable from 2008-2014 who presented to the ED with 3 month progression of shortness of breath, dyspnea on exertion and nonproductive cough.   ED Course: BNP 1500, CT chest showed: progression of ILD since 2014 with superimposed mild pulmonary edema and mild pericardial effusion.  No PE.  CT abd/pelvis: non-specific free fluid (edema), small amount of pericholicystic fluid but this non-sepcific in setting of the rest of the abdominal edema.  RUQ [place]: trace pericholicystic fluid but no other findings of cholecystitis, no gallstones.   Assessment & Plan:   Principal Problem:   Rheumatoid lung disease ([place]) Active Problems:   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 2008 -> 2014 wihtout change   New onset of congestive heart failure (HCC)   Acute hypoxic respiratory failure Rheumatoid arthritis associated interstitial lung disease Patient presenting with 81-month history of progressive shortness of breath with dyspnea on exertion and productive cough.  Patient was noted to be hypoxic on presentation with an oxygen saturation of 87 percent on room air.  Patient was distantly followed by rheumatology and previously was on prednisone and hydroxychloroquine but has been off for several years.  Patient was last seen by pulmonology, Dr. Chase Caller in 05/2014; in which her interstitial lung disease was reported as stable.  No infectious etiology elucidated on presentation.  CT angiogram chest on admission negative for pulmonary embolism, but did note new pericardial effusion and progression of her fibrotic changes to her lungs.  Currently not on any DMARDs, prednisone. TTE 9/16 w/ EF [id number], +  diastolic dysfunction, small circumferential pericardial effusion, moderate hypokinesis basal-mid RV, MV rheumatic in appearance, moderate TR --Pulmonology consulted for assistance with further recommendations and treatment --Continue supplemental oxygen, maintain SPO2 greater than [id number]  Chronic diastolic congestive heart failure Patient presenting with acute hypoxic respiratory failure as above.  BMP elevated at 1500.  D-dimer elevated 2.63 with negative PE on CTPA.  Chest x-ray notable for worsening fibrotic changes without any acute abnormality and no appreciable pulmonary edema.  TTE 06/24/2019 with EF 123456 with diastolic dysfunction. --Continue Lasix 40 mg IV daily --Strict I's and O's and daily weights --Follow BMP daily  Epigastric abdominal pain Patient with nonspecific abdominal pain, not associated with meals.  Bilirubin within normal limits.  Lipase at 25.  Urinalysis with positive nitrite, negative leukoesterase and 0-5 WBCs with rare bacteria.  CT abdomen/pelvis with bladder wall thickening, and trace fluid in the pelvis and mild body wall edema.  Ultrasound RUQ negative for gallstones with trace pericholecystic fluid. --Continue supportive care   DVT prophylaxis: Lovenox Code Status: Full code Family Communication: None Disposition Plan: Will upgrade patient to inpatient given her hypoxic respiratory failure likely secondary to worsening interstitial lung disease and need for further work-up per pulmonology.   Consultants:   PCCM  Procedures:   none  Antimicrobials:  none   Subjective: Patient seen and examined at bedside, resting comfortably.  Continues on supplemental oxygen with significant shortness of breath.  Patient continues with significant dyspnea on exertion with even minimal movement.  Also continues to complain of weakness/fatigue, shortness of breath.  No other specific complaints at this time.  Denies headache, no fever/chills/night sweats, no nausea  vomiting/diarrhea, no chest pain, palpitations, no congestion, no sore throat, no paresthesias.  No acute events overnight per nursing staff.  Objective: Vitals:   06/24/19 0330 06/24/19 0340 06/24/19 1056 06/24/19 1345  BP: (!) 131/93   99/69  Pulse: 84   81  Resp: (!) 22   20  Temp: 97.8 F (36.6 C)   (!) 97.5 F (36.4 C)  TempSrc: Oral   Oral  SpO2: 95%  99% 93%  Weight:  61.8 kg    Height:  5\' 11"  (1.803 m)      Intake/Output Summary (Last 24 hours) at 06/24/2019 1354 Last data filed at 06/24/2019 1158 Gross per 24 hour  Intake 498 ml  Output 500 ml  Net -2 ml   Filed Weights   06/23/19 2038 06/24/19 0340  Weight: 62.6 kg 61.8 kg    Examination:  General exam: Appears calm and comfortable, but significant fatigue/dyspnea on minimal exertion Respiratory system: Coarse breath sounds bilaterally with a dry crackles throughout, slightly increased respiratory effort, on 3 L nasal cannula saturating 95%, no accessory muscle use, no wheezing Cardiovascular system: S1 & S2 heard, RRR. No JVD, murmurs, rubs, gallops or clicks. No pedal edema. Gastrointestinal system: Abdomen is nondistended, soft and nontender. No organomegaly or masses felt. Normal bowel sounds heard. Central nervous system: Alert and oriented. No focal neurological deficits. Extremities: Symmetric 5 x 5 power. Skin: No rashes, lesions or ulcers Psychiatry: Judgement and insight appear normal. Mood & affect appropriate.     Data Reviewed: I have personally reviewed following labs and imaging studies  CBC: Recent Labs  Lab 06/23/19 1800  WBC 11.2*  HGB 12.3  HCT 41.0  MCV 96.0  PLT [id number]*   Basic Metabolic Panel: Recent Labs  Lab 06/23/19 1800 06/24/19 0438  NA 139 137  K 4.0 3.8  CL 108 108  CO2 22 21*  GLUCOSE 88 83  BUN 17 18  CREATININE 1.25* 1.20*  CALCIUM 8.8* 8.8*   GFR: Estimated Creatinine Clearance: 51.7 mL/min (A) (by C-G formula based on SCr of 1.2 mg/dL (H)). Liver Function  Tests: Recent Labs  Lab 06/23/19 1800  AST 35  ALT 18  ALKPHOS 61  BILITOT 0.6  PROT 8.4*  ALBUMIN 2.9*   Recent Labs  Lab 06/23/19 1800  LIPASE 25   No results for input(s): AMMONIA in the last 168 hours. Coagulation Profile: No results for input(s): INR, PROTIME in the last 168 hours. Cardiac Enzymes: No results for input(s): CKTOTAL, CKMB, CKMBINDEX, TROPONINI in the last 168 hours. BNP (last 3 results) No results for input(s): PROBNP in the last 8760 hours. HbA1C: No results for input(s): HGBA1C in the last 72 hours. CBG: No results for input(s): GLUCAP in the last 168 hours. Lipid Profile: No results for input(s): CHOL, HDL, LDLCALC, TRIG, CHOLHDL, LDLDIRECT in the last 72 hours. Thyroid Function Tests: No results for input(s): TSH, T4TOTAL, FREET4, T3FREE, THYROIDAB in the last 72 hours. Anemia Panel: No results for input(s): VITAMINB12, FOLATE, FERRITIN, TIBC, IRON, RETICCTPCT in the last 72 hours. Sepsis Labs: No results for input(s): PROCALCITON, LATICACIDVEN in the last 168 hours.  Recent Results (from the past 240 hour(s))  SARS CORONAVIRUS 2 (TAT 6-24 HRS) Nasopharyngeal Nasopharyngeal Swab     Status: None   Collection Time: 06/23/19  5:52 PM   Specimen: Nasopharyngeal Swab  Result Value Ref Range Status   SARS Coronavirus 2 NEGATIVE NEGATIVE Final    Comment: (NOTE) SARS-CoV-2 target nucleic acids are NOT DETECTED. The SARS-CoV-2  RNA is generally detectable in upper and lower respiratory specimens during the acute phase of infection. Negative results do not preclude SARS-CoV-2 infection, do not rule out co-infections with other pathogens, and should not be used as the sole basis for treatment or other patient management decisions. Negative results must be combined with clinical observations, patient history, and epidemiological information. The expected result is Negative. Fact Sheet for Patients: SugarRoll.be Fact Sheet  for Healthcare Providers: https://www.woods-mathews.com/ This test is not yet approved or cleared by the [place] FDA and  has been authorized for detection and/or diagnosis of SARS-CoV-2 by FDA under an Emergency Use Authorization (EUA). This EUA will remain  in effect (meaning this test can be used) for the duration of the COVID-19 declaration under Section 56 4(b)(1) of the Act, 21 U.S.C. section 360bbb-3(b)(1), unless the authorization is terminated or revoked sooner. Performed at [facility], [place] [address], [place], [place] [address]          Radiology Studies: Dg Chest 2 View  Result Date: 06/23/2019 CLINICAL DATA:  Upper abdominal pain and shortness of breath with exertion. EXAM: CHEST - 2 VIEW COMPARISON:  PA and lateral chest 02/22/2013 and CT chest 05/13/2013. FINDINGS: There is cardiomegaly. Coarsening of the pulmonary interstitium consistent with fibrosis has markedly worsened since the prior examination and appears worst in the lower lobes where it is more severe on the right. No pneumothorax or pleural effusion. No acute or focal bony abnormality. IMPRESSION: No acute disease. Marked progression of pulmonary fibrosis. Cardiomegaly. Electronically Signed   By: Inge Rise M.D.   On: 06/23/2019 18:04   Ct Angio Chest Pe W And/or Wo Contrast  Result Date: 06/23/2019 CLINICAL DATA:  Shortness of breath and positive D-dimer EXAM: CT ANGIOGRAPHY CHEST WITH CONTRAST TECHNIQUE: Multidetector CT imaging of the chest was performed using the standard protocol during bolus administration of intravenous contrast. Multiplanar CT image reconstructions and MIPs were obtained to evaluate the vascular anatomy. CONTRAST:  152mL OMNIPAQUE IOHEXOL 350 MG/ML SOLN COMPARISON:  Chest x-ray from earlier in the same day, CT from 05/13/2013. FINDINGS: Cardiovascular: Thoracic aorta demonstrates atherosclerotic calcifications although poor enhancement is noted. No aneurysmal  dilatation is seen. Cardiac shadow is enlarged. Mild pericardial effusion is seen measuring 15 mm in greatest dimension at its thickest point. The pulmonary artery is well visualized with a normal branching pattern. No filling defects to suggest pulmonary emboli are identified. Mediastinum/Nodes: The thoracic inlet is within normal limits. Stable right paratracheal lymph node is seen. Pre-vascular and AP window lymph nodes are seen slightly more prominent than that noted on the prior exam. No sizable hilar adenopathy is noted. The esophagus as visualized is within normal limits. Lungs/Pleura: Diffuse emphysematous changes are noted with multiple subpleural bowl identified. Underlying fibrotic changes are noted particularly in the lower lobes bilaterally slightly progressed when compared with the prior exam of 2014. Generalized increased density of lung parenchyma is noted likely related to underlying edema. No focal confluent infiltrate is seen. No sizable parenchymal nodule is noted. No effusion or pneumothorax is seen. Upper Abdomen: Covered in depth on the CT of the abdomen and pelvis. Musculoskeletal: Degenerative changes of the thoracic spine are seen. No acute bony abnormality is noted. Review of the MIP images confirms the above findings. IMPRESSION: No evidence of pulmonary emboli are identified. New pericardial effusion as described. Significant fibrotic changes within the lungs bilaterally progressed in the interval from the prior exam of 2014. Some superimposed edema is noted as well.  No focal confluent infiltrate is seen. Slight progression in reactive lymph nodes in the AP window and prevascular region when compared with the prior study. Aortic Atherosclerosis (ICD10-I70.0) and Emphysema (ICD10-J43.9). Electronically Signed   By: Inez Catalina M.D.   On: 06/23/2019 20:23   Ct Abdomen Pelvis W Contrast  Result Date: 06/23/2019 CLINICAL DATA:  Abdominal pain, shortness of breath with exertion EXAM: CT  ABDOMEN AND PELVIS WITH CONTRAST TECHNIQUE: Multidetector CT imaging of the abdomen and pelvis was performed using the standard protocol following bolus administration of intravenous contrast. CONTRAST:  144mL OMNIPAQUE IOHEXOL 350 MG/ML SOLN COMPARISON:  CT abdomen pelvis 07/12/2012 FINDINGS: Lower chest: Redemonstrated fibrotic interstitial changes of the lungs likely with superimposed edema cardiomegaly with pericardial effusion. Dedicated CT of the chest was performed. Please see dedicated report. Hepatobiliary: No focal liver abnormality is seen. Small volume pericholecystic fluid. No gallstones, gallbladder wall thickening, or biliary dilatation. Pancreas: Unremarkable. No pancreatic ductal dilatation or surrounding inflammatory changes. Spleen: Normal in size without focal abnormality. Adrenals/Urinary Tract: Adrenal glands are unremarkable. Kidneys are normal, without renal calculi, focal lesion, or hydronephrosis. Circumferential bladder wall thickening. Stomach/Bowel: Distal esophagus, stomach and duodenal sweep are unremarkable. No bowel wall thickening or dilatation. No evidence of obstruction. A normal appendix is visualized. Scattered colonic diverticula without focal pericolonic inflammation to suggest diverticulitis. Vascular/Lymphatic: The aorta is normal caliber. No suspicious or enlarged lymph nodes in the included lymphatic chains. Reproductive: Anteverted uterus. No concerning adnexal lesions. Other: Trace simple free fluid layering in the deep pelvis. Mild body wall edema. No bowel containing hernia. No free air or organized collections. Musculoskeletal: Multilevel degenerative changes are present in the imaged portions of the spine. No acute or concerning osseous lesions. IMPRESSION: 1. Fibrotic interstitial changes of the lungs with some possible superimposed edema as well as cardiomegaly with effusion. Please see report of the dedicated CT angiography of the chest performed the same day. 2.  Circumferential bladder wall thickening, which could reflect cystitis. Correlate with urinalysis. 3. Trace simple free fluid layering in the deep pelvis, and mild body wall edema, suggestive of volume overload. 4. Mild pericholecystic fluid without other findings of acute cholecystitis. Nonspecific in the setting of additional free intraperitoneal fluid. Correlate with abdominal symptoms and consider right upper quadrant ultrasound if there are supportive clinical features. Electronically Signed   By: Lovena Le M.D.   On: 06/23/2019 20:21   US Abdomen Limited  Result Date: 06/23/2019 CLINICAL DATA:  Upper abdominal pain for 2-3 weeks. EXAM: ULTRASOUND ABDOMEN LIMITED RIGHT UPPER QUADRANT COMPARISON:  None. FINDINGS: Gallbladder: No gallstones or wall thickening visualized. Trace amount of pericholecystic fluid is seen. No sonographic Murphy sign noted by sonographer. Common bile duct: Diameter: 0.5 cm Liver: No focal lesion identified. Within normal limits in parenchymal echogenicity. Portal vein is patent on color Doppler imaging with normal direction of blood flow towards the liver. Other: None. IMPRESSION: Negative for gallstones. Trace amount of pericholecystic fluid is nonspecific and may be due to volume overload. Electronically Signed   By: Inge Rise M.D.   On: 06/23/2019 18:58        Scheduled Meds:  enoxaparin (LOVENOX) injection  40 mg Subcutaneous Daily   furosemide  40 mg Intravenous Daily   [START ON 06/25/2019] pneumococcal 23 valent vaccine  0.5 mL Intramuscular Tomorrow-1000   sodium chloride flush  3 mL Intravenous Q12H   Continuous Infusions:  sodium chloride       LOS: 0 days    Time spent: 36  minutes spent on chart review, discussion with nursing staff, consultants, person reviewing all imaging studies and labs, updating family and interview/physical exam; more than 50% of that time was spent in counseling and/or coordination of care.  Severity of  Illness: The appropriate patient status for this patient is INPATIENT. Inpatient status is judged to be reasonable and necessary in order to provide the required intensity of service to ensure the patient's safety. The patient's presenting symptoms, physical exam findings, and initial radiographic and laboratory data in the context of their chronic comorbidities is felt to place them at high risk for further clinical deterioration. Furthermore, it is not anticipated that the patient will be medically stable for discharge from the hospital within 2 midnights of admission. The following factors support the patient status of inpatient.   " The patient's presenting symptoms include shortness of breath, dry cough, weakness/fatigue " The worrisome physical exam findings include hypoxia with SPO2 87% on room air " The initial radiographic and laboratory data are worrisome because of progressive fibrotic changes noted on CT " The chronic co-morbidities include rheumatoid arthritis with underlying ILD.   * I certify that at the point of admission it is my clinical judgment that the patient will require inpatient hospital care spanning beyond 2 midnights from the point of admission due to high intensity of service, high risk for further deterioration and high frequency of surveillance required.Donnamarie Poag [place] DO [facility] Pager [phone]  If 7PM-7AM, please contact night-coverage [email or web address] Password [email or web address] 06/24/2019, 1:54 PM

## 2019-06-25 LAB — BASIC METABOLIC PANEL
Anion gap: 9 (ref 5–15)
BUN: 17 mg/dL (ref 6–20)
CO2: 21 mmol/L — ABNORMAL LOW (ref 22–32)
Calcium: 8.4 mg/dL — ABNORMAL LOW (ref 8.9–10.3)
Chloride: 106 mmol/L (ref 98–111)
Creatinine, Ser: 1.39 mg/dL — ABNORMAL HIGH (ref 0.44–1.00)
GFR calc Af Amer: 49 mL/min — ABNORMAL LOW (ref >60)
GFR calc non Af Amer: 43 mL/min — ABNORMAL LOW (ref >60)
Glucose, Bld: 90 mg/dL (ref 70–99)
Potassium: 3.9 mmol/L (ref 3.5–5.1)
Sodium: 136 mmol/L (ref 135–145)

## 2019-06-25 LAB — HIV ANTIBODY (ROUTINE TESTING W REFLEX): HIV Screen 4th Generation wRfx: NONREACTIVE

## 2019-06-25 NOTE — Evaluation (Signed)
Occupational Therapy Evaluation Patient Details Name: DESSIE PREVAL MRN: [id number] DOB: 16-Sep-1961 Today's Date: 06/25/2019    History of Present Illness 58 y.o. female with medical history significant of PAF, not on chronic anticoagulation anymore, HTN, RA with rheumatic ILD that previously had been asymptomatic and stable from 2008-2014 and admitted for acute hypoxic respiratory failure and Chronic diastolic congestive heart failure   Clinical Impression   OT education complete and energy conservation hand out provided     Follow Up Recommendations  No OT follow up    Equipment Recommendations  None recommended by OT       Precautions / Restrictions Precautions Precautions: Fall Precaution Comments: monitor sats      Mobility Bed Mobility Overal bed mobility: Modified Independent             General bed mobility comments: SpO2 at rest 89 room air  Transfers Overall transfer level: Modified independent                    Balance Overall balance assessment: No apparent balance deficits (not formally assessed)                                         ADL either performed or assessed with clinical judgement   ADL Overall ADL's : Modified independent                                       General ADL Comments: pt mod I with ADL activity but did educate pt in energy conservation strategies and provided handout.  Pt appreciative and able to provide examples in how she would use the strateiges.     Vision Patient Visual Report: No change from baseline              Pertinent Vitals/Pain Pain Assessment: No/denies pain     Hand Dominance     Extremity/Trunk Assessment Upper Extremity Assessment Upper Extremity Assessment: Generalized weakness   Lower Extremity Assessment Lower Extremity Assessment: Overall WFL for tasks assessed   Cervical / Trunk Assessment Cervical / Trunk Assessment: Normal   Communication  Communication Communication: No difficulties   Cognition Arousal/Alertness: Awake/alert Behavior During Therapy: WFL for tasks assessed/performed Overall Cognitive Status: Within Functional Limits for tasks assessed                                                Home Living Family/patient expects to be discharged to:: Private residence Living Arrangements: Other relatives   Type of Home: House Home Access: Stairs to enter Technical brewer of Steps: 4   Home Layout: One level               Home Equipment: None          Prior Functioning/Environment Level of Independence: Independent                       OT Treatment/Interventions: Barrister's clerk education;Energy conservation;Self-care/ADL training    OT Goals(Current goals can be found in the care plan section) Acute Rehab OT Goals Patient Stated Goal: home OT Goal Formulation: With patient  OT Frequency:  AM-PAC OT "6 Clicks" Daily Activity     Outcome Measure Help from another person eating meals?: None Help from another person taking care of personal grooming?: None Help from another person toileting, which includes using toliet, bedpan, or urinal?: None Help from another person bathing (including washing, rinsing, drying)?: None Help from another person to put on and taking off regular upper body clothing?: None Help from another person to put on and taking off regular lower body clothing?: None 6 Click Score: 24   End of Session    Activity Tolerance: Patient tolerated treatment well Patient left: in chair;with call bell/phone within reach                   Time: 1215-1225 OT Time Calculation (min): 10 min Charges:  OT General Charges $OT Visit: 1 Visit OT Evaluation $OT Eval Moderate Complexity: 1 Mod  Kari Baars, OT Acute Rehabilitation Services [phone] Office- [place], Edwena Felty D 06/25/2019, 1:22 PM

## 2019-06-25 NOTE — Evaluation (Signed)
Physical Therapy One Time Evaluation Patient Details Name: Carolyn Proctor MRN: [id number] DOB: 04-26-1961 Today's Date: 06/25/2019   History of Present Illness  58 y.o. female with medical history significant of PAF, not on chronic anticoagulation anymore, HTN, RA with rheumatic ILD that previously had been asymptomatic and stable from 2008-2014 and admitted for acute hypoxic respiratory failure and Chronic diastolic congestive heart failure  Clinical Impression  Patient evaluated by Physical Therapy with no further acute PT needs identified. All education has been completed and the patient has no further questions.  Pt ambulated in hallway and oxygen saturations obtained.  Pt with difficult saturations reading.  Pt agreeable to ambulate with nursing staff and states she has a pulmonologist appointment set after d/c from hospital for follow up.  Pt would benefit from 4 wheeled walker with seat (rollator) for endurance and rest breaks as needed upon d/c. PT is signing off. Thank you for this referral.     Follow Up Recommendations No PT follow up    Equipment Recommendations  Other (comment)(may benefit from rollator if possible)    Recommendations for Other Services       Precautions / Restrictions Precautions Precautions: Fall Precaution Comments: monitor sats      Mobility  Bed Mobility Overal bed mobility: Modified Independent             General bed mobility comments: SpO2 at rest 86-89% room air  Transfers Overall transfer level: Modified independent                  Ambulation/Gait Ambulation/Gait assistance: Supervision;Modified independent (Device/Increase time) Gait Distance (Feet): 120 Feet Assistive device: Rolling walker (2 wheeled) Gait Pattern/deviations: Step-through pattern;Decreased stride length     General Gait Details: verbal cues for use of RW, pt utilized for support/endurance, SPO2 83% on room air (required 3 different pulse oximeters to  get a reading, uncertain of accuracy and RN notified) and required 2L O2 for SPO2 90%  Stairs            Wheelchair Mobility    Modified Rankin (Stroke Patients Only)       Balance Overall balance assessment: No apparent balance deficits (not formally assessed)                                           Pertinent Vitals/Pain Pain Assessment: No/denies pain    Home Living Family/patient expects to be discharged to:: Private residence Living Arrangements: Other relatives   Type of Home: House Home Access: Stairs to enter   Technical brewer of Steps: 4 Home Layout: One level Home Equipment: None      Prior Function Level of Independence: Independent               Hand Dominance        Extremity/Trunk Assessment        Lower Extremity Assessment Lower Extremity Assessment: Overall WFL for tasks assessed    Cervical / Trunk Assessment Cervical / Trunk Assessment: Normal  Communication   Communication: No difficulties  Cognition Arousal/Alertness: Awake/alert Behavior During Therapy: WFL for tasks assessed/performed Overall Cognitive Status: Within Functional Limits for tasks assessed                                        General  Comments      Exercises     Assessment/Plan    PT Assessment Patent does not need any further PT services  PT Problem List         PT Treatment Interventions      PT Goals (Current goals can be found in the Care Plan section)  Acute Rehab PT Goals PT Goal Formulation: All assessment and education complete, DC therapy    Frequency     Barriers to discharge        Co-evaluation               AM-PAC PT "6 Clicks" Mobility  Outcome Measure Help needed turning from your back to your side while in a flat bed without using bedrails?: None Help needed moving from lying on your back to sitting on the side of a flat bed without using bedrails?: None Help needed  moving to and from a bed to a chair (including a wheelchair)?: None Help needed standing up from a chair using your arms (e.g., wheelchair or bedside chair)?: A Little Help needed to walk in hospital room?: A Little Help needed climbing 3-5 steps with a railing? : A Little 6 Click Score: 21    End of Session Equipment Utilized During Treatment: Gait belt;Oxygen Activity Tolerance: Patient limited by fatigue Patient left: in chair;with chair alarm set;with call bell/phone within reach Nurse Communication: Mobility status PT Visit Diagnosis: Difficulty in walking, not elsewhere classified (R26.2)    Time: [id number] PT Time Calculation (min) (ACUTE ONLY): 22 min   Charges:   PT Evaluation $PT Eval Low Complexity: [place], PT, DPT Acute Rehabilitation Services Office: [phone] Pager: [phone]   Trena Platt 06/25/2019, 12:33 PM

## 2019-06-25 NOTE — Progress Notes (Addendum)
NAME:  Carolyn Proctor, MRN:  [id number], DOB:  06-05-1961, LOS: 1 ADMISSION DATE:  06/23/2019, CONSULTATION DATE:  06/24/19 REFERRING MD: Eric [place] DO CHIEF COMPLAINT:  SOB, ILD  Brief History   58 year old female with RA-ILD who presents with gradually worsening abdominal pain and shortness of breath x 4 days. PCCM consulted for ILD management  Past Medical History  Rheumatoid arthritis, RA-Interstitial lung disease, Atrial fibrillation on anticoagulation, Hx myocarditis  Significant Hospital Events   9/16 Admitted  Consults:  PCCM  Procedures:    Significant Diagnostic Tests:  06/23/19 CTA - No PE, mild pericardial effusion, diffuse emphysema, bibasilar pulmonary edema and effusion. Significant progressive subpleural fibrotic changes including honeycombing, diffuse involvement but predominantly worse at the lung bases 06/23/19 CT A/P - Mild pericholecystic fluid, trace simple free fluid in deep pelvis and mild body wall edema, circumferential bladder wall thickening  Micro Data:    Antimicrobials:   Interim history/subjective:  Feels better  Objective   Blood pressure (Abnormal) 109/59, pulse 88, temperature 98.1 F (36.7 C), temperature source Oral, resp. rate (Abnormal) 22, height 5\' 11"  (1.803 m), weight 61.9 kg, last menstrual period 02/19/2013, SpO2 96 %.        Intake/Output Summary (Last 24 hours) at 06/25/2019 1359 Last data filed at 06/25/2019 0600 Gross per 24 hour  Intake 240 ml  Output 1300 ml  Net -1060 ml   Filed Weights   06/23/19 2038 06/24/19 0340 06/25/19 0618  Weight: 62.6 kg 61.8 kg 61.9 kg   Physical Exam:  General frail 58 year old aaf resting in bed.  HENT NCAT no JVD voice is hoarse  pulm crackles in both bases Card RRR w/out MRG abd not tender + bowel sounds Ext no edema Neuro intact  Assessment & Plan:   RA-ILD with interval development of shortness of breath associated progressive fibrotic changes seen on CT suggestive of  fibrotic NSIP or UIP in setting of paraseptal emphysema.  -EF 123456; diastolic dysfxn, amd mod RV hypokinesis -started on Pred 20 mg/d -WOB improving. difficult to say if lasix or steroids  Plan Cont pred 20mg /d  Diuresis as able Cont PPI O2 for sats >88% (check walking oximetry prior to dc) Will see in out pt setting to decide on immunosuppression   outpatient follow-up with Rodman Pickle on 9/30 @ 0900.  Erick Colace ACNP-BC [place] Pager # [phone] OR # [phone] if no answer   PCCM attending:  58 year old female past medical history of rheumatoid arthritis.  CT imaging consistent with RA ILD suggestive of fibrotic NSIP/UIP.   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 Outpatient follow-up scheduled with Rodman Pickle, MD on 07/08/2019 at 9 AM.  Pulmonary will sign off.  Please call with any questions.  Garner Nash, DO [facility] 06/25/2019 3:46 PM  Personal pager: [phone] If unanswered, please page CCM On-call: [phone]

## 2019-06-25 NOTE — Progress Notes (Signed)
PROGRESS NOTE    Carolyn Proctor  [id number] DOB: Aug 25, 1961 DOA: 06/23/2019 PCP: Glendale Chard, MD    Brief Narrative:   Carolyn Proctor is a 58 y.o. female with medical history significant of PAF, not on chronic anticoagulation anymore, HTN, RA with rheumatic ILD that previously had been asymptomatic and stable from 2008-2014 who presented to the ED with 3 month progression of shortness of breath, dyspnea on exertion and nonproductive cough.   ED Course: BNP 1500, CT chest showed: progression of ILD since 2014 with superimposed mild pulmonary edema and mild pericardial effusion.  No PE.  CT abd/pelvis: non-specific free fluid (edema), small amount of pericholicystic fluid but this non-sepcific in setting of the rest of the abdominal edema.  RUQ [place]: trace pericholicystic fluid but no other findings of cholecystitis, no gallstones.   Assessment & Plan:   Principal Problem:   Rheumatoid lung disease ([place]) Active Problems:   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 2008 -> 2014 wihtout change   New onset of congestive heart failure (HCC)   Acute hypoxic respiratory failure Rheumatoid arthritis associated interstitial lung disease Patient presenting with 21-month history of progressive shortness of breath with dyspnea on exertion and productive cough.  Patient was noted to be hypoxic on presentation with an oxygen saturation of 87 percent on room air.  Patient was distantly followed by rheumatology and previously was on prednisone and hydroxychloroquine but has been off for several years.  Patient was last seen by pulmonology, Dr. Chase Caller in 05/2014; in which her interstitial lung disease was reported as stable.  No infectious etiology elucidated on presentation.  CT angiogram chest on admission negative for pulmonary embolism, but did note new pericardial effusion and progression of her fibrotic changes to her lungs.  Currently not on any DMARDs, prednisone. TTE 9/16 w/ EF 123456, +  diastolic dysfunction, small circumferential pericardial effusion, moderate hypokinesis basal-mid RV, MV rheumatic in appearance, moderate TR --Pulmonology following, appreciate assistance --starting prednisone 20mg  PO daily, Protonix 40mg  PO daily for GI PPX --Continue supplemental oxygen, maintain SPO2 greater than 92% --assess O2 sats with ambulation today --will f/u Pulm , Dr. Loanne Drilling on 9/30 outpatient   Chronic diastolic congestive heart failure Patient presenting with acute hypoxic respiratory failure as above.  BMP elevated at 1500.  D-dimer elevated 2.63 with negative PE on CTPA.  Chest x-ray notable for worsening fibrotic changes without any acute abnormality and no appreciable pulmonary edema.  TTE 06/24/2019 with EF 123456 with diastolic dysfunction. --will d/c lasix today for Bump in Cr 1.25-->1.20-->1.39 --Strict I's and O's and daily weights --Follow BMP daily  Epigastric abdominal pain Patient with nonspecific abdominal pain, not associated with meals.  Bilirubin within normal limits.  Lipase at 25.  Urinalysis with positive nitrite, negative leukoesterase and 0-5 WBCs with rare bacteria.  CT abdomen/pelvis with bladder wall thickening, and trace fluid in the pelvis and mild body wall edema.  Ultrasound RUQ negative for gallstones with trace pericholecystic fluid. --Continue supportive care  Weakness/Debility --PT/OT eval pending for dc needs   DVT prophylaxis: Lovenox Code Status: Full code Family Communication: None Disposition Plan: Will upgrade patient to inpatient given her hypoxic respiratory failure likely secondary to worsening interstitial lung disease and need for further work-up per pulmonology.   Consultants:   PCCM  Procedures:   none  Antimicrobials:  none   Subjective: Patient seen and examined at bedside, resting comfortably.  Titrated off supplemental oxygen, but has not ambulated yet. Contnues with SOB with  minimal exertion, slightly improved  since yesterday.  Also continues to complain of weakness/fatigue.  No other specific complaints at this time.  Denies headache, no fever/chills/night sweats, no nausea vomiting/diarrhea, no chest pain, palpitations, no congestion, no sore throat, no paresthesias.  No acute events overnight per nursing staff.  Objective: Vitals:   06/24/19 1345 06/24/19 1832 06/24/19 2342 06/25/19 0618  BP: 99/69  103/63 (!) 109/59  Pulse: 81  77 88  Resp: 20  (!) 24 (!) 22  Temp: (!) 97.5 F (36.4 C)  98.7 F (37.1 C) 98.1 F (36.7 C)  TempSrc: Oral  Oral Oral  SpO2: 93% 96% 97% 96%  Weight:    61.9 kg  Height:        Intake/Output Summary (Last 24 hours) at 06/25/2019 0851 Last data filed at 06/25/2019 0600 Gross per 24 hour  Intake 240 ml  Output 1800 ml  Net -1560 ml   Filed Weights   06/23/19 2038 06/24/19 0340 06/25/19 0618  Weight: 62.6 kg 61.8 kg 61.9 kg    Examination:  General exam: Appears calm and comfortable, but significant fatigue/dyspnea on minimal exertion Respiratory system: Coarse breath sounds bilaterally with a dry crackles throughout, slightly increased respiratory effort, on room air satting 93%, no accessory muscle use, no wheezing Cardiovascular system: S1 & S2 heard, RRR. No JVD, murmurs, rubs, gallops or clicks. No pedal edema. Gastrointestinal system: Abdomen is nondistended, soft and nontender. No organomegaly or masses felt. Normal bowel sounds heard. Central nervous system: Alert and oriented. No focal neurological deficits. Extremities: Symmetric 5 x 5 power. Skin: No rashes, lesions or ulcers Psychiatry: Judgement and insight appear normal. Mood & affect appropriate.     Data Reviewed: I have personally reviewed following labs and imaging studies  CBC: Recent Labs  Lab 06/23/19 1800  WBC 11.2*  HGB 12.3  HCT 41.0  MCV 96.0  PLT [id number]*   Basic Metabolic Panel: Recent Labs  Lab 06/23/19 1800 06/24/19 0438 06/25/19 0418  NA 139 137 136  K 4.0 3.8  3.9  CL 108 108 106  CO2 22 21* 21*  GLUCOSE 88 83 90  BUN 17 18 17   CREATININE 1.25* 1.20* 1.39*  CALCIUM 8.8* 8.8* 8.4*   GFR: Estimated Creatinine Clearance: 44.7 mL/min (A) (by C-G formula based on SCr of 1.39 mg/dL (H)). Liver Function Tests: Recent Labs  Lab 06/23/19 1800  AST 35  ALT 18  ALKPHOS 61  BILITOT 0.6  PROT 8.4*  ALBUMIN 2.9*   Recent Labs  Lab 06/23/19 1800  LIPASE 25   No results for input(s): AMMONIA in the last 168 hours. Coagulation Profile: No results for input(s): INR, PROTIME in the last 168 hours. Cardiac Enzymes: No results for input(s): CKTOTAL, CKMB, CKMBINDEX, TROPONINI in the last 168 hours. BNP (last 3 results) No results for input(s): PROBNP in the last 8760 hours. HbA1C: No results for input(s): HGBA1C in the last 72 hours. CBG: No results for input(s): GLUCAP in the last 168 hours. Lipid Profile: No results for input(s): CHOL, HDL, LDLCALC, TRIG, CHOLHDL, LDLDIRECT in the last 72 hours. Thyroid Function Tests: No results for input(s): TSH, T4TOTAL, FREET4, T3FREE, THYROIDAB in the last 72 hours. Anemia Panel: No results for input(s): VITAMINB12, FOLATE, FERRITIN, TIBC, IRON, RETICCTPCT in the last 72 hours. Sepsis Labs: No results for input(s): PROCALCITON, LATICACIDVEN in the last 168 hours.  Recent Results (from the past 240 hour(s))  SARS CORONAVIRUS 2 (TAT 6-24 HRS) Nasopharyngeal Nasopharyngeal Swab  Status: None   Collection Time: 06/23/19  5:52 PM   Specimen: Nasopharyngeal Swab  Result Value Ref Range Status   SARS Coronavirus 2 NEGATIVE NEGATIVE Final    Comment: (NOTE) SARS-CoV-2 target nucleic acids are NOT DETECTED. The SARS-CoV-2 RNA is generally detectable in upper and lower respiratory specimens during the acute phase of infection. Negative results do not preclude SARS-CoV-2 infection, do not rule out co-infections with other pathogens, and should not be used as the sole basis for treatment or other patient  management decisions. Negative results must be combined with clinical observations, patient history, and epidemiological information. The expected result is Negative. Fact Sheet for Patients: SugarRoll.be Fact Sheet for Healthcare Providers: https://www.woods-mathews.com/ This test is not yet approved or cleared by the [place] FDA and  has been authorized for detection and/or diagnosis of SARS-CoV-2 by FDA under an Emergency Use Authorization (EUA). This EUA will remain  in effect (meaning this test can be used) for the duration of the COVID-19 declaration under Section 56 4(b)(1) of the Act, 21 U.S.C. section 360bbb-3(b)(1), unless the authorization is terminated or revoked sooner. Performed at [facility], [place] [address], [place], [place] [address]          Radiology Studies: Dg Chest 2 View  Result Date: 06/23/2019 CLINICAL DATA:  Upper abdominal pain and shortness of breath with exertion. EXAM: CHEST - 2 VIEW COMPARISON:  PA and lateral chest 02/22/2013 and CT chest 05/13/2013. FINDINGS: There is cardiomegaly. Coarsening of the pulmonary interstitium consistent with fibrosis has markedly worsened since the prior examination and appears worst in the lower lobes where it is more severe on the right. No pneumothorax or pleural effusion. No acute or focal bony abnormality. IMPRESSION: No acute disease. Marked progression of pulmonary fibrosis. Cardiomegaly. Electronically Signed   By: Inge Rise M.D.   On: 06/23/2019 18:04   Ct Angio Chest Pe W And/or Wo Contrast  Result Date: 06/23/2019 CLINICAL DATA:  Shortness of breath and positive D-dimer EXAM: CT ANGIOGRAPHY CHEST WITH CONTRAST TECHNIQUE: Multidetector CT imaging of the chest was performed using the standard protocol during bolus administration of intravenous contrast. Multiplanar CT image reconstructions and MIPs were obtained to evaluate the vascular anatomy. CONTRAST:   127mL OMNIPAQUE IOHEXOL 350 MG/ML SOLN COMPARISON:  Chest x-ray from earlier in the same day, CT from 05/13/2013. FINDINGS: Cardiovascular: Thoracic aorta demonstrates atherosclerotic calcifications although poor enhancement is noted. No aneurysmal dilatation is seen. Cardiac shadow is enlarged. Mild pericardial effusion is seen measuring 15 mm in greatest dimension at its thickest point. The pulmonary artery is well visualized with a normal branching pattern. No filling defects to suggest pulmonary emboli are identified. Mediastinum/Nodes: The thoracic inlet is within normal limits. Stable right paratracheal lymph node is seen. Pre-vascular and AP window lymph nodes are seen slightly more prominent than that noted on the prior exam. No sizable hilar adenopathy is noted. The esophagus as visualized is within normal limits. Lungs/Pleura: Diffuse emphysematous changes are noted with multiple subpleural bowl identified. Underlying fibrotic changes are noted particularly in the lower lobes bilaterally slightly progressed when compared with the prior exam of 2014. Generalized increased density of lung parenchyma is noted likely related to underlying edema. No focal confluent infiltrate is seen. No sizable parenchymal nodule is noted. No effusion or pneumothorax is seen. Upper Abdomen: Covered in depth on the CT of the abdomen and pelvis. Musculoskeletal: Degenerative changes of the thoracic spine are seen. No acute bony abnormality is noted. Review of the  MIP images confirms the above findings. IMPRESSION: No evidence of pulmonary emboli are identified. New pericardial effusion as described. Significant fibrotic changes within the lungs bilaterally progressed in the interval from the prior exam of 2014. Some superimposed edema is noted as well. No focal confluent infiltrate is seen. Slight progression in reactive lymph nodes in the AP window and prevascular region when compared with the prior study. Aortic  Atherosclerosis (ICD10-I70.0) and Emphysema (ICD10-J43.9). Electronically Signed   By: Inez Catalina M.D.   On: 06/23/2019 20:23   Ct Abdomen Pelvis W Contrast  Result Date: 06/23/2019 CLINICAL DATA:  Abdominal pain, shortness of breath with exertion EXAM: CT ABDOMEN AND PELVIS WITH CONTRAST TECHNIQUE: Multidetector CT imaging of the abdomen and pelvis was performed using the standard protocol following bolus administration of intravenous contrast. CONTRAST:  181mL OMNIPAQUE IOHEXOL 350 MG/ML SOLN COMPARISON:  CT abdomen pelvis 07/12/2012 FINDINGS: Lower chest: Redemonstrated fibrotic interstitial changes of the lungs likely with superimposed edema cardiomegaly with pericardial effusion. Dedicated CT of the chest was performed. Please see dedicated report. Hepatobiliary: No focal liver abnormality is seen. Small volume pericholecystic fluid. No gallstones, gallbladder wall thickening, or biliary dilatation. Pancreas: Unremarkable. No pancreatic ductal dilatation or surrounding inflammatory changes. Spleen: Normal in size without focal abnormality. Adrenals/Urinary Tract: Adrenal glands are unremarkable. Kidneys are normal, without renal calculi, focal lesion, or hydronephrosis. Circumferential bladder wall thickening. Stomach/Bowel: Distal esophagus, stomach and duodenal sweep are unremarkable. No bowel wall thickening or dilatation. No evidence of obstruction. A normal appendix is visualized. Scattered colonic diverticula without focal pericolonic inflammation to suggest diverticulitis. Vascular/Lymphatic: The aorta is normal caliber. No suspicious or enlarged lymph nodes in the included lymphatic chains. Reproductive: Anteverted uterus. No concerning adnexal lesions. Other: Trace simple free fluid layering in the deep pelvis. Mild body wall edema. No bowel containing hernia. No free air or organized collections. Musculoskeletal: Multilevel degenerative changes are present in the imaged portions of the spine. No  acute or concerning osseous lesions. IMPRESSION: 1. Fibrotic interstitial changes of the lungs with some possible superimposed edema as well as cardiomegaly with effusion. Please see report of the dedicated CT angiography of the chest performed the same day. 2. Circumferential bladder wall thickening, which could reflect cystitis. Correlate with urinalysis. 3. Trace simple free fluid layering in the deep pelvis, and mild body wall edema, suggestive of volume overload. 4. Mild pericholecystic fluid without other findings of acute cholecystitis. Nonspecific in the setting of additional free intraperitoneal fluid. Correlate with abdominal symptoms and consider right upper quadrant ultrasound if there are supportive clinical features. Electronically Signed   By: Lovena Le M.D.   On: 06/23/2019 20:21   US Abdomen Limited  Result Date: 06/23/2019 CLINICAL DATA:  Upper abdominal pain for 2-3 weeks. EXAM: ULTRASOUND ABDOMEN LIMITED RIGHT UPPER QUADRANT COMPARISON:  None. FINDINGS: Gallbladder: No gallstones or wall thickening visualized. Trace amount of pericholecystic fluid is seen. No sonographic Murphy sign noted by sonographer. Common bile duct: Diameter: 0.5 cm Liver: No focal lesion identified. Within normal limits in parenchymal echogenicity. Portal vein is patent on color Doppler imaging with normal direction of blood flow towards the liver. Other: None. IMPRESSION: Negative for gallstones. Trace amount of pericholecystic fluid is nonspecific and may be due to volume overload. Electronically Signed   By: Inge Rise M.D.   On: 06/23/2019 18:58        Scheduled Meds:  enoxaparin (LOVENOX) injection  40 mg Subcutaneous Daily   pantoprazole  40 mg Oral Daily   pneumococcal  23 valent vaccine  0.5 mL Intramuscular Tomorrow-1000   predniSONE  20 mg Oral Q breakfast   sodium chloride flush  3 mL Intravenous Q12H   Continuous Infusions:  sodium chloride       LOS: 1 day    Time spent:  29 minutes spent on chart review, discussion with nursing staff, consultants, person reviewing all imaging studies and labs, updating family and interview/physical exam; more than 50% of that time was spent in counseling and/or coordination of care.    Kimerly Rowand J [place] DO [facility] Pager [phone]  If 7PM-7AM, please contact night-coverage [email or web address] Password [facility] 06/25/2019, 8:51 AM

## 2019-06-26 DIAGNOSIS — I5032 Chronic diastolic (congestive) heart failure: Secondary | ICD-10-CM

## 2019-06-26 HISTORY — DX: Chronic diastolic (congestive) heart failure: I50.32

## 2019-06-26 LAB — BASIC METABOLIC PANEL
Anion gap: 8 (ref 5–15)
BUN: 18 mg/dL (ref 6–20)
CO2: 22 mmol/L (ref 22–32)
Calcium: 8.7 mg/dL — ABNORMAL LOW (ref 8.9–10.3)
Chloride: 106 mmol/L (ref 98–111)
Creatinine, Ser: 1.15 mg/dL — ABNORMAL HIGH (ref 0.44–1.00)
GFR calc Af Amer: >60 mL/min (ref >60)
GFR calc non Af Amer: 54 mL/min — ABNORMAL LOW (ref >60)
Glucose, Bld: 112 mg/dL — ABNORMAL HIGH (ref 70–99)
Potassium: 4.3 mmol/L (ref 3.5–5.1)
Sodium: 136 mmol/L (ref 135–145)

## 2019-06-26 MED ORDER — PREDNISONE 20 MG PO TABS: 20.0000 mg | ORAL_TABLET | Freq: Every day | ORAL | 0 refills | Status: DC

## 2019-06-26 MED ORDER — FUROSEMIDE 20 MG PO TABS: 20.0000 mg | ORAL_TABLET | ORAL | 0 refills | Status: DC

## 2019-06-26 MED ORDER — PANTOPRAZOLE SODIUM 40 MG PO TBEC: 40.0000 mg | DELAYED_RELEASE_TABLET | Freq: Every day | ORAL | 0 refills | Status: DC

## 2019-06-26 MED ORDER — ALBUTEROL SULFATE HFA 108 (90 BASE) MCG/ACT IN AERS: 2.0000 | INHALATION_SPRAY | Freq: Four times a day (QID) | RESPIRATORY_TRACT | 2 refills | Status: DC | PRN

## 2019-06-26 NOTE — Plan of Care (Signed)
  Problem: Education: Goal: Knowledge of General Education information will improve Description: Including pain rating scale, medication(s)/side effects and non-pharmacologic comfort measures Outcome: Adequate for Discharge   Problem: Health Behavior/Discharge Planning: Goal: Ability to manage health-related needs will improve Outcome: Adequate for Discharge   Problem: Clinical Measurements: Goal: Ability to maintain clinical measurements within normal limits will improve Outcome: Adequate for Discharge Goal: Will remain free from infection Outcome: Adequate for Discharge Goal: Diagnostic test results will improve Outcome: Adequate for Discharge Goal: Respiratory complications will improve Outcome: Adequate for Discharge Goal: Cardiovascular complication will be avoided Outcome: Adequate for Discharge   Problem: Activity: Goal: Risk for activity intolerance will decrease Outcome: Adequate for Discharge   Problem: Nutrition: Goal: Adequate nutrition will be maintained Outcome: Adequate for Discharge   Problem: Coping: Goal: Level of anxiety will decrease Outcome: Adequate for Discharge   Problem: Elimination: Goal: Will not experience complications related to bowel motility Outcome: Adequate for Discharge Goal: Will not experience complications related to urinary retention Outcome: Adequate for Discharge   Problem: Safety: Goal: Ability to remain free from injury will improve Outcome: Adequate for Discharge   Problem: Education: Goal: Ability to demonstrate management of disease process will improve Outcome: Adequate for Discharge Goal: Ability to verbalize understanding of medication therapies will improve Outcome: Adequate for Discharge Goal: Individualized Educational Video(s) Outcome: Adequate for Discharge   Problem: Activity: Goal: Capacity to carry out activities will improve Outcome: Adequate for Discharge   Problem: Cardiac: Goal: Ability to achieve and  maintain adequate cardiopulmonary perfusion will improve Outcome: Adequate for Discharge   

## 2019-06-26 NOTE — Progress Notes (Signed)
SATURATION QUALIFICATIONS: (This note is used to comply with regulatory documentation for home oxygen)  Patient Saturations on Room Air at Rest = 95%  Patient Saturations on Room Air while Ambulating = 93%  Patient Saturations on  Liters of oxygen while Ambulating 0 Liters = %  Please briefly explain why patient needs home oxygen: No current indication

## 2019-06-26 NOTE — Progress Notes (Signed)
SATURATION QUALIFICATIONS: (This note is used to comply with regulatory documentation for home oxygen)  Patient Saturations on Room Air at Rest = 100%  Patient Saturations on Room Air while Ambulating = 85%  Patient Saturations on 1 Liters of oxygen while Ambulating = 95%  Please briefly explain why patient needs home oxygen:  As patient continues ambulating, oxygen level drops until oxygen is placed on patient.  Patient also states she "felt winded" after walking for 250 ft.

## 2019-06-26 NOTE — Discharge Summary (Addendum)
Physician Discharge Summary  Carolyn Proctor [id number] DOB: [birth year] DOA: 06/23/2019  PCP: Glendale Chard, MD  Admit date: 06/23/2019 Discharge date: 06/26/2019  Admitted From: Home Disposition:  Home  Recommendations for Outpatient Follow-up:  1. Follow up with PCP in 1 week 2. Follow up with Pulmonology Dr. Loanne Drilling scheduled on 07/08/2019 at 0900 3. Please obtain BMP in one week to assess renal function 4. Started on prednisone 20mg  PO daily for RA-ILD/pulm fibrosis, albuterol MDI prn, protonix for GI protection on prednisone and lasix 20mg  PO EOD for dCHF.    Home Health: No Equipment/Devices: oxygen 2LNC  Discharge Condition: stable CODE STATUS: Full Code Diet recommendation: Heart Healthy  History of present illness:  Carolyn H Marionis a 58 y.o.femalewith medical history significant ofPAF, not on chronic anticoagulation anymore, HTN, RA with rheumatic ILD that previously had been asymptomatic and stable from 2008-2014 who presented to the ED with 3 month progression of shortness of breath, dyspnea on exertion and nonproductive cough.   ED Course:BNP 1500, CT chest showed: progression of ILD since 2014 with superimposed mild pulmonary edema and mild pericardial effusion. No PE.  CT abd/pelvis: non-specific free fluid (edema), small amount of pericholicystic fluid but this non-sepcific in setting of the rest of the abdominal edema.  RUQ [place]: trace pericholicystic fluid but no other findings of cholecystitis, no gallstones.  Hospital course:  Acute hypoxic respiratory failure Rheumatoid arthritis associated interstitial lung disease Patient presenting with 35-month history of progressive shortness of breath with dyspnea on exertion and productive cough.  Patient was noted to be hypoxic on presentation with an oxygen saturation of 87 percent on room air.  Patient was distantly followed by rheumatology and previously was on prednisone and hydroxychloroquine but has  been off for several years.  Patient was last seen by pulmonology, Dr. Chase Caller in 05/2014; in which her interstitial lung disease was reported as stable. No infectious etiology elucidated on presentation.  CT angiogram chest on admission negative for pulmonary embolism, but did note new pericardial effusion and progression of her fibrotic changes to her lungs.  Currently not on any DMARDs, prednisone. TTE 9/16 w/ EF [id number], + diastolic dysfunction, small circumferential pericardial effusion, moderate hypokinesis basal-mid RV, MV rheumatic in appearance, moderate TR. Pulmonology was consulted for assistance and follow during the hospital course.  Patient was started on prednisone 20 mg p.o. daily.  Patient was evaluated on room air during ambulation and she desaturated to 83%.  Patient will discharge home on prednisone 20 mg p.o. daily, albuterol MDI as needed, and Protonix for GI protection while on steroids, and with supplemental oxygen.  Patient has follow-up scheduled with pulmonology, Dr. Loanne Drilling on 07/08/2019 at 9 AM for further work-up and treatment options for underlying fibrotic/interstitial lung disease likely related to rheumatoid arthritis.  Chronic diastolic congestive heart failure, compensated Patient presenting with acute hypoxic respiratory failure as above.  BMP elevated at 1500.  D-dimer elevated 2.63 with negative PE on CTPA.  Chest x-ray notable for worsening fibrotic changes without any acute abnormality and no appreciable pulmonary edema.  TTE 06/24/2019 with EF [id number] with diastolic dysfunction.  Patient was started on IV furosemide with good diuresis.  Although her likely etiology of her respiratory failure was likely secondary to progression in her fibrotic lung disease with rheumatoid arthritis associated ILD versus decompensated CHF.  We will continue diuresis outpatient with furosemide 20 mg p.o. every other day.  Patient instructed to monitor daily weights to bring to next PCP  visit.  Epigastric abdominal  pain Patient with nonspecific abdominal pain, not associated with meals.  Bilirubin within normal limits. Lipase at 25.  Urinalysis with positive nitrite, negative leukoesterase and 0-5 WBCs with rare bacteria. CT abdomen/pelvis with bladder wall thickening, and trace fluid in the pelvis and mild body wall edema. Ultrasound RUQ negative for gallstones with trace pericholecystic fluid.  Pain had resolved at time of discharge.   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   Complete by: As directed    Call MD for:  difficulty breathing, headache or visual disturbances   Complete by: As directed    Call MD for:  extreme fatigue   Complete by: As directed    Call MD for:  persistant dizziness or light-headedness   Complete by: As directed    Call MD for:  persistant nausea and vomiting   Complete by: As directed    Call MD for:  severe uncontrolled pain   Complete by: As directed    Call MD for:  temperature >100.4   Complete by: As directed    Diet - low sodium heart healthy   Complete by: As directed    Heart Failure patients record your daily weight using the same scale at the same time of day   Complete by: As directed    Increase activity slowly   Complete by: As directed    STOP any activity that causes chest pain, shortness of breath, dizziness, sweating, or exessive  weakness   Complete by: As directed      Allergies as of 06/26/2019   No Known Allergies     Medication List    TAKE these medications   albuterol 108 (90 Base) MCG/ACT inhaler Commonly known as: VENTOLIN HFA Inhale 2 puffs into the lungs every 6 (six) hours as needed for wheezing or shortness of breath.   cetirizine 10 MG tablet Commonly known as: ZYRTEC Take 10 mg by mouth daily.   cholecalciferol 1000 units tablet Commonly known as: VITAMIN D Take 3,000 Units by mouth daily.   furosemide 20 MG tablet Commonly known as: Lasix Take 1 tablet (20 mg total) by mouth every other day.   naproxen sodium 220 MG tablet Commonly known as: ALEVE Take 220 mg by mouth 2 (two) times daily as needed (pain).   pantoprazole 40 MG tablet Commonly known as: PROTONIX Take 1 tablet (40 mg total) by mouth daily.   predniSONE 20 MG tablet Commonly known as: DELTASONE Take 1 tablet (20 mg total) by mouth daily with breakfast.   Vitamin B-12 1000 MCG Subl Place 1,000 mcg under the tongue.            Durable Medical Equipment  (From admission, onward)         Start     Ordered   06/26/19 0635  For home use only DME oxygen  Once    Question Answer Comment  Length of Need Lifetime   Mode or (Route) Nasal cannula   Liters per Minute 2   Frequency Continuous (stationary and portable oxygen unit needed)   Oxygen delivery system Gas      06/26/19 0634         Follow-up Information    Margaretha Seeds, MD Follow up on 07/08/2019.   Specialty: Pulmonary Disease Why: 9am Contact information: [address] [place] [address] [phone]        Glendale Chard, MD. Schedule an appointment as soon as possible for a visit in 1 week(s).   Specialty: Internal Medicine Contact information: [address] [place] [address] [phone]          No Known Allergies  Consultations:  Pulmonology, Dr. Loanne Drilling   Procedures/Studies: Dg  Chest 2 View  Result Date: 06/23/2019 CLINICAL DATA:  Upper abdominal pain and shortness of breath with exertion. EXAM: CHEST - 2 VIEW COMPARISON:  PA and lateral chest 02/22/2013 and CT chest 05/13/2013. FINDINGS: There is cardiomegaly. Coarsening of the pulmonary interstitium consistent with fibrosis has markedly worsened since the prior examination and appears worst in the lower lobes where it is more severe on the right. No pneumothorax or pleural effusion. No acute or focal bony abnormality. IMPRESSION: No acute disease. Marked progression of pulmonary fibrosis. Cardiomegaly. Electronically Signed   By: Inge Rise M.D.   On: 06/23/2019 18:04   Ct Angio Chest Pe W And/or Wo Contrast  Result Date: 06/23/2019 CLINICAL DATA:  Shortness of breath and positive D-dimer EXAM: CT ANGIOGRAPHY CHEST WITH CONTRAST TECHNIQUE: Multidetector CT imaging of the chest was performed using the standard protocol during bolus administration of intravenous contrast. Multiplanar CT image reconstructions and MIPs were obtained to evaluate the vascular anatomy. CONTRAST:  144mL OMNIPAQUE IOHEXOL 350 MG/ML SOLN COMPARISON:  Chest x-ray from earlier in the same day, CT from 05/13/2013. FINDINGS: Cardiovascular: Thoracic aorta demonstrates atherosclerotic calcifications although poor enhancement is noted. No aneurysmal dilatation is seen. Cardiac shadow is enlarged. Mild pericardial effusion is seen measuring 15 mm in greatest dimension at its thickest point. The pulmonary artery is well visualized with a normal branching pattern. No filling defects to suggest pulmonary emboli are identified. Mediastinum/Nodes: The thoracic inlet is within normal limits. Stable right paratracheal lymph node is seen. Pre-vascular and AP window lymph nodes are seen slightly more prominent than that noted on the prior exam. No sizable hilar adenopathy is noted. The esophagus as visualized is within normal limits. Lungs/Pleura: Diffuse  emphysematous changes are noted with multiple subpleural bowl identified. Underlying fibrotic changes are noted particularly in the lower lobes bilaterally slightly progressed when compared with the prior exam of 2014. Generalized increased density of lung parenchyma is noted likely related to underlying edema. No focal confluent infiltrate is seen. No sizable parenchymal nodule is noted. No effusion or pneumothorax is seen. Upper Abdomen: Covered in depth on the CT of the abdomen and pelvis. Musculoskeletal: Degenerative changes of the thoracic spine are seen. No acute bony abnormality is noted. Review of the MIP images confirms the above findings. IMPRESSION: No evidence of pulmonary emboli are identified. New pericardial effusion as described. Significant fibrotic changes within the lungs bilaterally progressed in the interval from the prior exam of 2014. Some superimposed edema is noted as well. No focal confluent infiltrate is seen. Slight progression in reactive lymph nodes in the AP window and prevascular region when compared with the  prior study. Aortic Atherosclerosis (ICD10-I70.0) and Emphysema (ICD10-J43.9). Electronically Signed   By: Inez Catalina M.D.   On: 06/23/2019 20:23   Ct Abdomen Pelvis W Contrast  Result Date: 06/23/2019 CLINICAL DATA:  Abdominal pain, shortness of breath with exertion EXAM: CT ABDOMEN AND PELVIS WITH CONTRAST TECHNIQUE: Multidetector CT imaging of the abdomen and pelvis was performed using the standard protocol following bolus administration of intravenous contrast. CONTRAST:  133mL OMNIPAQUE IOHEXOL 350 MG/ML SOLN COMPARISON:  CT abdomen pelvis 07/12/2012 FINDINGS: Lower chest: Redemonstrated fibrotic interstitial changes of the lungs likely with superimposed edema cardiomegaly with pericardial effusion. Dedicated CT of the chest was performed. Please see dedicated report. Hepatobiliary: No focal liver abnormality is seen. Small volume pericholecystic fluid. No gallstones,  gallbladder wall thickening, or biliary dilatation. Pancreas: Unremarkable. No pancreatic ductal dilatation or surrounding inflammatory changes. Spleen: Normal in size without focal abnormality. Adrenals/Urinary Tract: Adrenal glands are unremarkable. Kidneys are normal, without renal calculi, focal lesion, or hydronephrosis. Circumferential bladder wall thickening. Stomach/Bowel: Distal esophagus, stomach and duodenal sweep are unremarkable. No bowel wall thickening or dilatation. No evidence of obstruction. A normal appendix is visualized. Scattered colonic diverticula without focal pericolonic inflammation to suggest diverticulitis. Vascular/Lymphatic: The aorta is normal caliber. No suspicious or enlarged lymph nodes in the included lymphatic chains. Reproductive: Anteverted uterus. No concerning adnexal lesions. Other: Trace simple free fluid layering in the deep pelvis. Mild body wall edema. No bowel containing hernia. No free air or organized collections. Musculoskeletal: Multilevel degenerative changes are present in the imaged portions of the spine. No acute or concerning osseous lesions. IMPRESSION: 1. Fibrotic interstitial changes of the lungs with some possible superimposed edema as well as cardiomegaly with effusion. Please see report of the dedicated CT angiography of the chest performed the same day. 2. Circumferential bladder wall thickening, which could reflect cystitis. Correlate with urinalysis. 3. Trace simple free fluid layering in the deep pelvis, and mild body wall edema, suggestive of volume overload. 4. Mild pericholecystic fluid without other findings of acute cholecystitis. Nonspecific in the setting of additional free intraperitoneal fluid. Correlate with abdominal symptoms and consider right upper quadrant ultrasound if there are supportive clinical features. Electronically Signed   By: Lovena Le M.D.   On: 06/23/2019 20:21   US Abdomen Limited  Result Date: 06/23/2019 CLINICAL  DATA:  Upper abdominal pain for 2-3 weeks. EXAM: ULTRASOUND ABDOMEN LIMITED RIGHT UPPER QUADRANT COMPARISON:  None. FINDINGS: Gallbladder: No gallstones or wall thickening visualized. Trace amount of pericholecystic fluid is seen. No sonographic Murphy sign noted by sonographer. Common bile duct: Diameter: 0.5 cm Liver: No focal lesion identified. Within normal limits in parenchymal echogenicity. Portal vein is patent on color Doppler imaging with normal direction of blood flow towards the liver. Other: None. IMPRESSION: Negative for gallstones. Trace amount of pericholecystic fluid is nonspecific and may be due to volume overload. Electronically Signed   By: Inge Rise M.D.   On: 06/23/2019 18:58     Transthoracic echocardiogram 06/24/2019: IMPRESSIONS    1. The left ventricle has normal systolic function with an ejection fraction of 60-65%. The cavity size was normal. There is mildly increased left ventricular wall thickness. Left ventricular diastolic Doppler parameters are consistent with impaired  relaxation. Indeterminate filling pressures.  2. The right ventricle has moderately reduced systolic function. The cavity was moderately enlarged. There is no increase in right ventricular wall thickness.  3. Moderate hypokinesis of the basal to mid RV with relative sparing of the apex, consistent with McConnell's  sign.  4. Small pericardial effusion.  5. The pericardial effusion is circumferential.  6. The mitral valve is rheumatic. Mild thickening of the mitral valve leaflet. Mild calcification of the mitral valve leaflet. There is moderate mitral annular calcification present.  7. 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 Mild prolapse of the anterior and posterior mitral valve leaflets.  8. Tricuspid valve regurgitation is moderate.  9. The aortic valve is tricuspid. No stenosis of the aortic valve. 10. The aorta is normal  unless otherwise noted.   Subjective: Patient seen and examined at bedside, resting comfortably and sleeping but easily arousable.  States breathing is gradually improving but continues with mild shortness of breath.  Oxygen saturations good currently on 2 L nasal cannula.  Okay to discharge home per pulmonology with outpatient follow-up.  Patient without any further complaints or concerns at this time.  Denies headache, no fever/chills/night sweats, no nausea cefonicid diarrhea, no chest pain, no palpitations, no abdominal pain, no paresthesias.   Discharge Exam: Vitals:   06/25/19 2306 06/26/19 0553  BP: 117/85 100/71  Pulse: 94 85  Resp: 17   Temp: 97.8 F (36.6 C) 98.6 F (37 C)  SpO2: 95% 95%   Vitals:   06/25/19 0618 06/25/19 2306 06/26/19 0500 06/26/19 0553  BP: (!) 109/59 117/85  100/71  Pulse: 88 94  85  Resp: (!) 22 17    Temp: 98.1 F (36.7 C) 97.8 F (36.6 C)  98.6 F (37 C)  TempSrc: Oral Oral  Oral  SpO2: 96% 95%  95%  Weight: 61.9 kg  61.8 kg   Height:        General exam: Appears calm and comfortable, but significant fatigue/dyspnea on minimal exertion Respiratory system: Coarse breath sounds bilaterally with a dry crackles throughout, normal respiratory effort, on 2 L nasal cannula saturating 95%, no accessory muscle use, no wheezing Cardiovascular system: S1 & S2 heard, RRR. No JVD, murmurs, rubs, gallops or clicks. No pedal edema. Gastrointestinal system: Abdomen is nondistended, soft and nontender. No organomegaly or masses felt. Normal bowel sounds heard. Central nervous system: Alert and oriented. No focal neurological deficits. Extremities: Symmetric 5 x 5 power. Skin: No rashes, lesions or ulcers Psychiatry: Judgement and insight appear normal. Mood & affect appropriate.     The results of significant diagnostics from this hospitalization (including imaging, microbiology, ancillary and laboratory) are listed below for reference.      Microbiology: Recent Results (from the past 240 hour(s))  SARS CORONAVIRUS 2 (TAT 6-24 HRS) Nasopharyngeal Nasopharyngeal Swab     Status: None   Collection Time: 06/23/19  5:52 PM   Specimen: Nasopharyngeal Swab  Result Value Ref Range Status   SARS Coronavirus 2 NEGATIVE NEGATIVE Final    Comment: (NOTE) SARS-CoV-2 target nucleic acids are NOT DETECTED. The SARS-CoV-2 RNA is generally detectable in upper and lower respiratory specimens during the acute phase of infection. Negative results do not preclude SARS-CoV-2 infection, do not rule out co-infections with other pathogens, and should not be used as the sole basis for treatment or other patient management decisions. Negative results must be combined with clinical observations, patient history, and epidemiological information. The expected result is Negative. Fact Sheet for Patients: SugarRoll.be Fact Sheet for Healthcare Providers: https://www.woods-mathews.com/ This test is not yet approved or cleared by the [place] FDA and  has been authorized for detection and/or diagnosis of SARS-CoV-2 by FDA under an Emergency Use Authorization (EUA). This EUA will  remain  in effect (meaning this test can be used) for the duration of the COVID-19 declaration under Section 56 4(b)(1) of the Act, 21 U.S.C. section 360bbb-3(b)(1), unless the authorization is terminated or revoked sooner. Performed at [facility], [place] [address] [place], [place] [address]      Labs: BNP (last 3 results) Recent Labs    06/23/19 1800  BNP [id number]   Basic Metabolic Panel: Recent Labs  Lab 06/23/19 1800 06/24/19 0438 06/25/19 0418 06/26/19 0346  NA 139 137 136 136  K 4.0 3.8 3.9 4.3  CL 108 108 106 106  CO2 22 21* 21* 22  GLUCOSE 88 83 90 112*  BUN 17 18 17 18   CREATININE 1.25* 1.20* 1.39* 1.15*  CALCIUM 8.8* 8.8* 8.4* 8.7*   Liver Function Tests: Recent Labs  Lab 06/23/19 1800   AST 35  ALT 18  ALKPHOS 61  BILITOT 0.6  PROT 8.4*  ALBUMIN 2.9*   Recent Labs  Lab 06/23/19 1800  LIPASE 25   No results for input(s): AMMONIA in the last 168 hours. CBC: Recent Labs  Lab 06/23/19 1800  WBC 11.2*  HGB 12.3  HCT 41.0  MCV 96.0  PLT 463*   Cardiac Enzymes: No results for input(s): CKTOTAL, CKMB, CKMBINDEX, TROPONINI in the last 168 hours. BNP: Invalid input(s): POCBNP CBG: No results for input(s): GLUCAP in the last 168 hours. D-Dimer Recent Labs    06/23/19 1800  DDIMER 2.63*   Hgb A1c No results for input(s): HGBA1C in the last 72 hours. Lipid Profile No results for input(s): CHOL, HDL, LDLCALC, TRIG, CHOLHDL, LDLDIRECT in the last 72 hours. Thyroid function studies No results for input(s): TSH, T4TOTAL, T3FREE, THYROIDAB in the last 72 hours.  Invalid input(s): FREET3 Anemia work up No results for input(s): VITAMINB12, FOLATE, FERRITIN, TIBC, IRON, RETICCTPCT in the last 72 hours. Urinalysis    Component Value Date/Time   COLORURINE YELLOW 06/23/2019 2037   APPEARANCEUR HAZY (A) 06/23/2019 2037   LABSPEC 1.019 06/23/2019 2037   PHURINE 6.0 06/23/2019 2037   GLUCOSEU NEGATIVE 06/23/2019 2037   HGBUR NEGATIVE 06/23/2019 2037   [place] NEGATIVE 06/23/2019 2037   [place] NEGATIVE 06/23/2019 2037   PROTEINUR 30 (A) 06/23/2019 2037   UROBILINOGEN 1.0 01/16/2013 1737   NITRITE POSITIVE (A) 06/23/2019 2037   LEUKOCYTESUR NEGATIVE 06/23/2019 2037   Sepsis Labs Invalid input(s): PROCALCITONIN,  WBC,  LACTICIDVEN Microbiology Recent Results (from the past 240 hour(s))  SARS CORONAVIRUS 2 (TAT 6-24 HRS) Nasopharyngeal Nasopharyngeal Swab     Status: None   Collection Time: 06/23/19  5:52 PM   Specimen: Nasopharyngeal Swab  Result Value Ref Range Status   SARS Coronavirus 2 NEGATIVE NEGATIVE Final    Comment: (NOTE) SARS-CoV-2 target nucleic acids are NOT DETECTED. The SARS-CoV-2 RNA is generally detectable in upper and  lower respiratory specimens during the acute phase of infection. Negative results do not preclude SARS-CoV-2 infection, do not rule out co-infections with other pathogens, and should not be used as the sole basis for treatment or other patient management decisions. Negative results must be combined with clinical observations, patient history, and epidemiological information. The expected result is Negative. Fact Sheet for Patients: SugarRoll.be Fact Sheet for Healthcare Providers: https://www.woods-mathews.com/ This test is not yet approved or cleared by the [place] FDA and  has been authorized for detection and/or diagnosis of SARS-CoV-2 by FDA under an Emergency Use Authorization (EUA). This EUA will remain  in effect (meaning this test can be  used) for the duration of the COVID-19 declaration under Section 56 4(b)(1) of the Act, 21 U.S.C. section 360bbb-3(b)(1), unless the authorization is terminated or revoked sooner. Performed at [facility], [place] [address] [place], [place] [address]      Time coordinating discharge: Over 30 minutes  SIGNED:   Margee Trentham J [place] DO  [facility] 06/26/2019, 8:15 AM

## 2019-06-26 NOTE — Progress Notes (Signed)
RN reviewed discharge paperwork with patient. NT removed IV. All vital signs stable. Patient in possession of delivered oxygen supplies and all personal belongings. RN and Agricultural consultant transported patient to front entrance via wheelchair and assisted patient into waiting vehicle.

## 2019-06-26 NOTE — Discharge Instructions (Signed)
Preventing Heart Failure Heart failure is a condition in which the heart has trouble pumping blood. This may mean that the heart cannot pump enough blood out to the body, or that the heart does not fill up with enough blood. Either of those problems can lead to symptoms such as fatigue, trouble breathing, and swelling throughout the body. This is a common medical condition that affects not only the heart, but the entire body. Making certain nutrition and lifestyle changes can help you prevent heart failure and avoid serious health problems. What nutrition changes can be made?   If you are overweight or obese, reduce how many calories you eat each day so that you lose weight. Work with your health care provider or a diet and nutrition specialist (dietitian) to determine how many calories you need each day.  Eat foods that are low in salt (sodium). Avoid adding extra salt to foods.  Eat a well-balanced diet that includes a lot of: ? Fresh fruits and vegetables. ? Whole grains. ? Lean meats. ? Beans. ? Fat-free or low-fat dairy products.  Avoid foods that contain a lot of: ? Trans fats. ? Saturated fats. ? Sugar. ? Cholesterol. What lifestyle changes can be made?   Do not use any products that contain nicotine or tobacco, such as cigarettes and e-cigarettes. If you need help quitting or reducing how much you smoke, ask your health care provider.  Stop using alcohol, or limit alcohol intake to no more than 1 drink a day for nonpregnant women and 2 drinks a day for men. One drink equals 12 oz of beer, 5 oz of wine, or 1 oz of hard liquor.  Exercise for at least 150 minutes each week, or as much as told by your health care provider. ? Do moderate-intensity exercise, such as brisk walking, bicycling, or water aerobics. ? Ask your health care provider which activities are safe for you.  See a health care provider regularly for screening and wellness checks. Know your heart health  indicators, such as: ? Blood pressure. ? Cholesterol levels. ? Blood sugar (glucose) levels. ? Weight and BMI.  If you have diabetes, manage your condition and follow your treatment plan as instructed.  Try to get 7-9 hours of sleep each night. To help with sleep: ? Keep your bedroom cool and dark. ? Do not eat a heavy meal during the hour before you go to bed. ? Do not drink alcohol or caffeinated drinks before bed. ? Avoid screen time before bedtime. This means avoiding television, computers, tablets, and cell phones.  Find ways to relax and manage stress. These may include: ? Breathing exercises. ? Meditation. ? Yoga. ? Listening to music. Why are these changes important?  A well-balanced diet with the appropriate amount of calories can keep your body weight at a healthy level, which reduces strain on your heart.  A low-sodium diet can help keep your blood pressure in a normal range and keep your blood vessels working properly.  Quitting smoking and limiting alcohol intake can reduce harmful effects that these substances have on your heart and blood vessels.  Regular exercise can keep your heart strong so it can pump blood normally.  Managing diabetes helps your blood circulate and can help you maintain a healthy weight.  Managing stress helps to reduce the risk of high blood pressure and heart problems. What can happen if changes are not made? Heart failure can cause very serious problems that may get worse over time, such as:  Extreme fatigue during normal physical activities.  Shortness of breath or trouble breathing.  Swelling in your abdomen, legs, ankles, feet, or neck.  Fluid buildup throughout the body.  Weight gain.  Cough.  Frequent urination. What can I do to lower my risk? You may be able to lower your risk of heart failure by:  Losing weight or keeping your weight under control.  Working with your health care provider to manage  your: ? Cholesterol. ? Blood pressure. ? Diabetes, if this applies.  Eating a healthy diet.  Exercising regularly.  Avoiding unhealthy habits, such as smoking, drinking, or using drugs.  Getting plenty of sleep.  Managing your stress. How is this treated? Heart failure cannot be cured except by heart transplant, but treatment can help to improve your quality of life. Treatment may include:  Medicines to help: ? Lower blood pressure. ? Remove excess sodium from your body. ? Relax blood vessels. ? Improve heart function. ? Control other symptoms of heart failure.  Surgery to open blocked coronary arteries or repair damaged heart valves.  Implantation of a biventricular pacemaker to improve heart muscle function (cardiac resynchronization therapy). This device paces both the right ventricle and left ventricle.  Implantation of a device to treat serious abnormal heart rhythms (implantable cardioverter defibrillator, ICD).  Implantation of a mechanical heart pump to improve the pumping ability of your heart (left ventricular assist device, LVAD).  Heart transplant. This treatment is considered for certain people who do not improve with other treatments. Where to find more information  National Heart, Lung, and Blood Institute: ClickDebate.gl  Centers for Disease Control and Prevention: LawyerNetworking.com.cy  NIH Senior Health: https://www.montgomery-brown.info/  American Heart Association: ReligiousCamps.at.jsp Contact a health care provider if:  You have rapid weight gain.  You have increasing shortness of breath that is unusual for you.  You tire easily, or you are unable to participate in your usual activities.  You cough more than normal, especially with physical activity.  You have any swelling or  more swelling in areas such as your hands, feet, ankles, or abdomen. Summary  Heart failure can be prevented by making changes to your diet and your lifestyle.  It is important to eat a healthy diet, manage your weight, exercise regularly, manage stress, avoid drugs and alcohol, and keep your cholesterol and blood pressure under control.  Heart failure can cause very serious problems over time. This information is not intended to replace advice given to you by your health care provider. Make sure you discuss any questions you have with your health care provider. Document Released: 05/15/2016 Document Revised: 01/16/2019 Document Reviewed: 05/15/2016 Elsevier Patient Education  2020 [place]. Preventing Pulmonary Fibrosis Pulmonary fibrosis is scarring, stiffening, and thickening (fibrosis) that develops over time in lung tissue (pulmonary tissue). At first, this may cause shortness of breath and a dry cough. Over time, symptoms often become more serious and complications may occur. There are many different causes of pulmonary fibrosis. Sometimes the cause is not known. You may not be able to entirely prevent pulmonary fibrosis, but you may be able to reduce your risk by:  Avoiding pollutants.  Protecting your lungs.  Making lifestyle changes.  Managing conditions that may lead to pulmonary fibrosis. How can pulmonary fibrosis affect me? Pulmonary fibrosis is a serious condition. Once you have this condition, it tends to get worse (progress) gradually over several years and may lead to:  Collapsed lung.  Infection of your lungs (pneumonia).  Blood clots in the lungs.  Lung cancer.  Inability of your heart to pump blood (heart failure).  A condition in which the lungs cannot take in enough oxygen or remove excess carbon dioxide (respiratory failure). What actions can I take to prevent this condition?     Avoid pollutants Protect yourself from breathing harmful dusts.  Harmful dusts include grain, wood, soil, coal, silica, and asbestos. To protect yourself from these dusts:  Know which types of dust you may be exposed to.  Avoid working in dusty areas when possible.  Work in an area with good air circulation (ventilation).  Wet down work areas to reduce dust when possible.  Use an approved respirator mask.  Do not eat, drink, or take breaks in dusty areas.  Wash your hands and face or shower after work.  Change into clean clothes before going home.  Do not park your car near areas that may be affected by dust. Protect your lungs Take steps to protect yourself from lung infections:  Stay up to date on all vaccines. This includes the flu (influenza) and pneumonia (pneumococcal) vaccines.  Avoid contact with people who are sick with flu or cold symptoms.  Wash your hands often.  Use a disinfectant to regularly clean surfaces at home and at work.  Get 7-8 hours of sleep every night.  Eat a healthy diet that includes plenty of fruits and vegetables, whole grains, and healthy sources of protein, such as poultry, fish, nuts, and low-fat dairy.  Exercise at least 30 minutes most days of the week. Make lifestyle changes  Do not use any products that contain nicotine or tobacco, such as cigarettes and e-cigarettes. If you need help quitting, ask your health care provider.  Avoid secondhand smoke. General information  If you have heartburn, or GERD, work with your health care provider to treat your condition. This may include taking medicines and making changes to your diet.  Ask your health care provider whether your medicines put you at risk for pulmonary fibrosis. This is especially important if you have seizures, heart disease, or cancer. Talk with your health care provider about other treatments that may work for you. Where to find more information Learn more about pulmonary fibrosis from:  American Lung Association: lung.org  Pulmonary  Fibrosis Foundation: pulmonaryfibrosis.org Summary  You may be able to lower your risk for pulmonary fibrosis by avoiding pollutants, protecting your lungs, making lifestyle changes, and managing conditions that may lead to pulmonary fibrosis.  If you work or spend time around harmful dusts, take precautions to avoid inhaling dust.  Protecting your lungs from infections can help you stay healthy and may reduce your risk of pulmonary fibrosis.  Quitting smoking can help reduce the risk of pulmonary fibrosis and other serious lung conditions. This information is not intended to replace advice given to you by your health care provider. Make sure you discuss any questions you have with your health care provider. Document Released: 12/16/2017 Document Revised: 01/16/2019 Document Reviewed: 12/16/2017 Elsevier Patient Education  [place]. Heart Failure, Diagnosis  Heart failure means that your heart is not able to pump blood in the right way. This makes it hard for your body to work well. Heart failure is usually a long-term (chronic) condition. You must take good care of yourself and follow your treatment plan from your doctor. What are the causes? This condition may be caused by:  High blood pressure.  Build up of cholesterol and fat in the arteries.  Heart attack. This injures the heart  muscle.  Heart valves that do not open and close properly.  Damage of the heart muscle. This is also called cardiomyopathy.  Lung disease.  Abnormal heart rhythms. What increases the risk? The risk of heart failure goes up as a person ages. This condition is also more likely to develop in people who:  Are overweight.  Are female.  Smoke or chew tobacco.  Abuse alcohol or illegal drugs.  Have taken medicines that can damage the heart.  Have diabetes.  Have abnormal heart rhythms.  Have thyroid problems.  Have low blood counts (anemia). What are the signs or symptoms? Symptoms  of this condition include:  Shortness of breath.  Coughing.  Swelling of the feet, ankles, legs, or belly.  Losing weight for no reason.  Trouble breathing.  Waking from sleep because of the need to sit up and get more air.  Rapid heartbeat.  Being very tired.  Feeling dizzy, or feeling like you may pass out (faint).  Having no desire to eat.  Feeling like you may vomit (nauseous).  Peeing (urinating) more at night.  Feeling confused. How is this treated?     This condition may be treated with:  Medicines. These can be given to treat blood pressure and to make the heart muscles stronger.  Changes in your daily life. These may include eating a healthy diet, staying at a healthy body weight, quitting tobacco and illegal drug use, or doing exercises.  Surgery. Surgery can be done to open blocked valves, or to put devices in the heart, such as pacemakers.  A donor heart (heart transplant). You will receive a healthy heart from a donor. Follow these instructions at home:  Treat other conditions as told by your doctor. These may include high blood pressure, diabetes, thyroid disease, or abnormal heart rhythms.  Learn as much as you can about heart failure.  Get support as you need it.  Keep all follow-up visits as told by your doctor. This is important. Summary  Heart failure means that your heart is not able to pump blood in the right way.  This condition is caused by high blood pressure, heart attack, or damage of the heart muscle.  Symptoms of this condition include shortness of breath and swelling of the feet, ankles, legs, or belly. You may also feel very tired or feel like you may vomit.  You may be treated with medicines, surgery, or changes in your daily life.  Treat other health conditions as told by your doctor. This information is not intended to replace advice given to you by your health care provider. Make sure you discuss any questions you have with  your health care provider. Document Released: 07/03/2008 Document Revised: 12/12/2018 Document Reviewed: 12/12/2018 Elsevier Patient Education  [place].

## 2019-06-30 ENCOUNTER — Telehealth: Payer: Self-pay | Admitting: Pulmonary Disease

## 2019-06-30 NOTE — Telephone Encounter (Signed)
Spoke with pt, she doesn't think she will have enough oxygen to last her until 9/30 for her appt with Dr. Loanne Drilling. I advised her that I would call the DME company tomorrow (most likely [employer]) to see what we could do about this matter. Will hold in triage until tomorrow.

## 2019-07-01 NOTE — Telephone Encounter (Signed)
Called to let us know that she is on eclipse 5 o2 tank. Pt would like a call back

## 2019-07-01 NOTE — Telephone Encounter (Signed)
Spoke with pt. She was wanting to make [place] aware that she thinks she will actually have enough oxygen to last until her appointment. Pt is conserving the oxygen she has. Advised the pt to call us back if she thinks she will run out. Nothing further was needed.

## 2019-07-07 ENCOUNTER — Ambulatory Visit (INDEPENDENT_AMBULATORY_CARE_PROVIDER_SITE_OTHER): Payer: BC Managed Care – PPO | Admitting: Pulmonary Disease

## 2019-07-07 ENCOUNTER — Encounter: Payer: Self-pay | Admitting: Pulmonary Disease

## 2019-07-07 ENCOUNTER — Other Ambulatory Visit: Payer: Self-pay

## 2019-07-07 VITALS — BP 118/70 | HR 70 | Temp 98.2°F | Ht 71.0 in | Wt 135.6 lb

## 2019-07-07 DIAGNOSIS — J849 Interstitial pulmonary disease, unspecified: Secondary | ICD-10-CM

## 2019-07-07 DIAGNOSIS — J8410 Pulmonary fibrosis, unspecified: Secondary | ICD-10-CM | POA: Diagnosis not present

## 2019-07-07 DIAGNOSIS — M069 Rheumatoid arthritis, unspecified: Secondary | ICD-10-CM

## 2019-07-07 DIAGNOSIS — J432 Centrilobular emphysema: Secondary | ICD-10-CM

## 2019-07-07 DIAGNOSIS — M329 Systemic lupus erythematosus, unspecified: Secondary | ICD-10-CM

## 2019-07-07 DIAGNOSIS — Z0000 Encounter for general adult medical examination without abnormal findings: Secondary | ICD-10-CM | POA: Insufficient documentation

## 2019-07-07 DIAGNOSIS — I4891 Unspecified atrial fibrillation: Secondary | ICD-10-CM

## 2019-07-07 DIAGNOSIS — I5032 Chronic diastolic (congestive) heart failure: Secondary | ICD-10-CM

## 2019-07-07 HISTORY — DX: Centrilobular emphysema: J43.2

## 2019-07-07 HISTORY — DX: Rheumatoid arthritis, unspecified: M06.9

## 2019-07-07 NOTE — Assessment & Plan Note (Signed)
Plan: Continue Lasix Referral to cardiology

## 2019-07-07 NOTE — Assessment & Plan Note (Signed)
Per patient when she was evaluated by Dr. Estanislado Pandy for previously in the past it was felt that this was a mild case if anything of lupus.  Likely most of patient's rheumatological issues are directly related to rheumatoid arthritis per patient.  Plan: Proceed forward with outpatient rheumatology referral

## 2019-07-07 NOTE — Assessment & Plan Note (Signed)
Never smoker Thin frail female Moderately reduced DLCO in 2015 pulmonary function test  Plan: Lab work today to rule out alpha-1 antitrypsin deficiency

## 2019-07-07 NOTE — Assessment & Plan Note (Signed)
Plan: Patient needs flu vaccine at next office visit Patient also needs Pneumovax 23

## 2019-07-07 NOTE — Assessment & Plan Note (Signed)
Plan: Continue prednisone 20 mg daily 4-week follow-up to establish outpatient care with Dr. Loanne Drilling Pulmonary function test ordered today

## 2019-07-07 NOTE — Assessment & Plan Note (Signed)
Plan: Continue prednisone 20 mg daily Needs outpatient rheumatology appointment Likely will repeat CT chest in 1 year if not sooner based off of patient's clinical status Needs pulmonary function testing 4-week follow-up to establish outpatient care with Dr. Loanne Drilling

## 2019-07-07 NOTE — Patient Instructions (Addendum)
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 [address] [place], [place] Telephone [phone]   2. Emphysema   - Alpha-1 antitrypsin phenotype - Pulmonary function test; Future  3. ILD (interstitial lung disease) ([place])  - Pulmonary function test; Future  4. History of Atrial fibrillation  - Ambulatory referral to Cardiology  5. History of Chronic diastolic CHF (congestive heart failure)  - Ambulatory referral to Cardiology  With your abnormal echocardiogram the ultrasound of your heart they did in the hospital I will refer you to outpatient cardiology.  We recommend today:  Orders Placed This Encounter  Procedures  . Alpha-1 antitrypsin phenotype  . Ambulatory referral to Cardiology    Referral Priority:   Routine    Referral Type:   Consultation    Referral Reason:   Specialty Services Required    Requested Specialty:   Cardiology    Number of Visits Requested:   1  . Pulmonary function test    Standing Status:   Future    Standing Expiration Date:   07/06/2020    Order Specific Question:   Where should this test be performed?    Answer:   [facility]    Order Specific Question:   Full PFT: includes the following: basic spirometry, spirometry pre & post bronchodilator, diffusion capacity (DLCO), lung volumes    Answer:   Full PFT   Orders Placed This Encounter  Procedures  . Alpha-1 antitrypsin phenotype  . Ambulatory referral to Cardiology  . Pulmonary function test   No orders of the defined types were  placed in this encounter.   Follow Up:    Return in about 4 weeks (around 08/04/2019), or if symptoms worsen or fail to improve, for Follow up with Dr. Loanne Drilling.   Please do your part to reduce the spread of COVID-19:      Reduce your risk of any infection  and COVID19 by using the similar precautions used for avoiding the common cold or flu:  Marland Kitchen Wash your hands often with soap and warm water for at least 20 seconds.  If soap and water are not readily available, use an alcohol-based hand sanitizer with at least 60% alcohol.  . If coughing or sneezing, cover your mouth and nose by coughing or sneezing into the elbow areas of your shirt or coat, into a tissue or into your sleeve (not your hands). Langley Gauss A MASK when in public  . Avoid shaking hands with others and consider head nods or verbal greetings only. . Avoid touching your eyes, nose, or mouth with unwashed hands.  . Avoid close contact with people who are sick. . Avoid places or events with large numbers of people in one location, like concerts or sporting events. . If you have some symptoms but not all symptoms, continue to monitor at home and seek medical attention if your symptoms worsen. . If you are having a medical emergency, call 911.   [place]  Tele[facility] / e-Visit: [email or web address]         [facility]: [facility]: [id number]                   [facility]: [id number]     It is flu season:   >>> Best ways to protect herself from the flu: Receive the yearly flu vaccine, practice good hand hygiene washing with soap and also using hand sanitizer when available, eat a nutritious meals, get adequate rest, hydrate appropriately   Please contact the office if your symptoms worsen or you have concerns that you are not improving.   Thank you for choosing [facility] for your healthcare, and for allowing [place] to partner with you on your healthcare journey. I am thankful to be able to provide care to you today.   Wyn Quaker FNP-C

## 2019-07-07 NOTE — Progress Notes (Signed)
@Patient  ID: Carolyn Proctor, female    DOB: 07/27/1961, 58 y.o.   MRN: [id number]  Chief Complaint  Patient presents with   Hospitalization Follow-up    F/U from hospital visit from 9/15. States her breathing has been ok since her hospital follow up. Denies any dizziness and SOB.     Referring provider: Glendale Chard, MD  HPI:  58 year old female never smoker consulted with our practice on 06/24/2019 during the hospitalization of an RA ILD flare.  Patient was previously seen by our office over 5 years ago by Dr. Chase Caller.  PMH: Rheumatoid arthritis, RA-Interstitial lung disease, Atrial fibrillation on anticoagulation, Hx myocarditis Smoker/ Smoking History: Never smoker Maintenance: 20 mg of prednisone daily Pt of: Future patient of Dr. Loanne Drilling  07/07/2019  - Visit   58 year old female never smoker initially reconsulted with our team on 06/24/2019 during hospitalization of a RA ILD flare.  Patient was previously seen over 5 years ago in our office by Dr. Chase Caller.  Patient has known rheumatoid arthritis had previously been established with rheumatology Dr. Estanislado Pandy.  Patient reports that there is a falling out between her and that provider and she has not had regular rheumatology follow-up since.  She also had lost her job temporarily which affected her insurance coverage as well as with following up with our office.  That is why there is been a 5-year break between management of her health.  Patient presented to the emergency room on 06/23/2019 where a CTA chest was performed.  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  It is likely believed to be RA ILD.  Patient also had an echocardiogram that was performed while hospitalized.  Patient was discharged on extended prednisone taper and encouraged to have outpatient pulmonary follow-up.  Patient also has established with a new primary care provider Dr. Valora Piccolo with [facility].  She reports  that she completed her initial visit with him today in 07/07/2019.  Per patient Dr. Valora Piccolo is put in an order for rheumatology for the patient to be evaluated and establish for outpatient care.  Patient also reports that primary care Dr. Valora Piccolo has referred patient to gastroenterology for her to be evaluated outpatient.  She has been started on Protonix PPI and they believe the abdominal pain may be likely related to acid reflux.  She reports that it has improved some since starting Protonix.  CTA chest as well as echocardiogram from hospitalization are listed below:  06/23/19 CTA - No PE, mild pericardial effusion, diffuse emphysema, bibasilar pulmonary edema and effusion. 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  Patient CTA chest does show diffuse emphysema patient is a never smoker.  She reports she has not been around anyone he smokes.  Patient is unsure if she is ever been tested for alpha-1 antitrypsin deficiency.    Tests:   06/23/19 CTA - No PE, mild pericardial effusion, diffuse emphysema, bibasilar pulmonary edema and effusion. Significant progressive subpleural fibrotic changes including honeycombing, diffuse involvement but predominantly worse at the lung bases  06/23/19 CT A/P - Mild pericholecystic fluid, trace  simple free fluid in deep pelvis and mild body wall edema, circumferential bladder wall thickening  06/24/2019-echocardiogram-LV ejection fraction 60 to 65%,  mildly increased left ventricular wall thickness, right ventricle is moderately reduced systolic function, cavity moderately enlarged, no increase in right ventricular wall thickness, small pericardial effusion, hockey-stick-like motion of the anterior mitral valve leaflet 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  06/11/2014-pulmonary function test- FVC 2.59 (72% predicted), postbronchodilator ratio 80, postbronchodilator FEV1 2.18 (75% predicted), positive bronchodilator response in FEV1, mid flow reversibility, DLCO 13.7 (42% predicted)  FENO:  No results found for: NITRICOXIDE  PFT: PFT Results Latest Ref Rng & Units 06/11/2014  FVC-Pre L 2.59  FVC-Predicted Pre % 72  FVC-Post L 2.74  FVC-Predicted Post % 76  Pre FEV1/FVC % % 73  Post FEV1/FCV % % 80  FEV1-Pre L 1.89  FEV1-Predicted Pre % 65  FEV1-Post L 2.18  DLCO UNC% % 42  DLCO COR %Predicted % 71  TLC L 3.89  TLC % Predicted % 65  RV % Predicted % 57    WALK:  SIX MIN WALK 07/07/2019 06/01/2014  Supplimental Oxygen during Test? (L/min) No No  Tech Comments: Patient was able to complete 2 laps at a steady pace. Denied any SOB, chest pain or leg pain. She was able to walk and carry a conversation without stopping or gasping for air. No O2 was needed during or after walk. -    Imaging: Dg Chest 2 View  Result Date: 06/23/2019 CLINICAL DATA:  Upper abdominal pain and shortness of breath with exertion. EXAM: CHEST - 2 VIEW COMPARISON:  PA and lateral chest 02/22/2013 and CT chest 05/13/2013. FINDINGS: There is cardiomegaly. Coarsening of the pulmonary interstitium consistent with fibrosis has markedly worsened since the prior examination and appears worst in the lower lobes where it is more severe on the right. No pneumothorax or pleural effusion. No acute or focal bony abnormality. IMPRESSION: No acute disease. Marked progression of pulmonary fibrosis. Cardiomegaly. Electronically Signed   By: Inge Rise M.D.   On:  06/23/2019 18:04   Ct Angio Chest Pe W And/or Wo Contrast  Result Date: 06/23/2019 CLINICAL DATA:  Shortness of breath and positive D-dimer EXAM: CT ANGIOGRAPHY CHEST WITH CONTRAST TECHNIQUE: Multidetector CT imaging of the chest was performed using the standard protocol during bolus administration of intravenous contrast. Multiplanar CT image reconstructions and MIPs were obtained to evaluate the vascular anatomy. CONTRAST:  165mL OMNIPAQUE IOHEXOL 350 MG/ML SOLN COMPARISON:  Chest x-ray from earlier in the same day, CT from 05/13/2013. FINDINGS: Cardiovascular: Thoracic aorta demonstrates atherosclerotic calcifications although poor enhancement is noted. No aneurysmal dilatation is seen. Cardiac shadow is enlarged. Mild pericardial effusion is seen measuring 15 mm in greatest dimension at its thickest point. The pulmonary artery is well visualized with a normal branching pattern. No filling defects to suggest pulmonary emboli are identified. Mediastinum/Nodes: The thoracic inlet is within normal limits. Stable right paratracheal lymph node is seen. Pre-vascular and AP window lymph nodes are seen slightly more prominent than that noted on the prior exam. No sizable hilar adenopathy is noted. The esophagus as visualized is within normal limits. Lungs/Pleura: Diffuse emphysematous changes are noted with multiple subpleural bowl identified. Underlying fibrotic changes are noted particularly in the lower lobes bilaterally slightly progressed when compared with the prior exam of 2014. Generalized increased density of lung parenchyma is noted likely related to underlying edema. No focal confluent infiltrate is seen. No sizable parenchymal nodule is noted. No effusion or pneumothorax is  seen. Upper Abdomen: Covered in depth on the CT of the abdomen and pelvis. Musculoskeletal: Degenerative changes of the thoracic spine are seen. No acute bony abnormality is noted. Review of the MIP images confirms the above findings.  IMPRESSION: No evidence of pulmonary emboli are identified. New pericardial effusion as described. Significant fibrotic changes within the lungs bilaterally progressed in the interval from the prior exam of 2014. Some superimposed edema is noted as well. No focal confluent infiltrate is seen. Slight progression in reactive lymph nodes in the AP window and prevascular region when compared with the prior study. Aortic Atherosclerosis (ICD10-I70.0) and Emphysema (ICD10-J43.9). Electronically Signed   By: Inez Catalina M.D.   On: 06/23/2019 20:23   Ct Abdomen Pelvis W Contrast  Result Date: 06/23/2019 CLINICAL DATA:  Abdominal pain, shortness of breath with exertion EXAM: CT ABDOMEN AND PELVIS WITH CONTRAST TECHNIQUE: Multidetector CT imaging of the abdomen and pelvis was performed using the standard protocol following bolus administration of intravenous contrast. CONTRAST:  16mL OMNIPAQUE IOHEXOL 350 MG/ML SOLN COMPARISON:  CT abdomen pelvis 07/12/2012 FINDINGS: Lower chest: Redemonstrated fibrotic interstitial changes of the lungs likely with superimposed edema cardiomegaly with pericardial effusion. Dedicated CT of the chest was performed. Please see dedicated report. Hepatobiliary: No focal liver abnormality is seen. Small volume pericholecystic fluid. No gallstones, gallbladder wall thickening, or biliary dilatation. Pancreas: Unremarkable. No pancreatic ductal dilatation or surrounding inflammatory changes. Spleen: Normal in size without focal abnormality. Adrenals/Urinary Tract: Adrenal glands are unremarkable. Kidneys are normal, without renal calculi, focal lesion, or hydronephrosis. Circumferential bladder wall thickening. Stomach/Bowel: Distal esophagus, stomach and duodenal sweep are unremarkable. No bowel wall thickening or dilatation. No evidence of obstruction. A normal appendix is visualized. Scattered colonic diverticula without focal pericolonic inflammation to suggest diverticulitis.  Vascular/Lymphatic: The aorta is normal caliber. No suspicious or enlarged lymph nodes in the included lymphatic chains. Reproductive: Anteverted uterus. No concerning adnexal lesions. Other: Trace simple free fluid layering in the deep pelvis. Mild body wall edema. No bowel containing hernia. No free air or organized collections. Musculoskeletal: Multilevel degenerative changes are present in the imaged portions of the spine. No acute or concerning osseous lesions. IMPRESSION: 1. Fibrotic interstitial changes of the lungs with some possible superimposed edema as well as cardiomegaly with effusion. Please see report of the dedicated CT angiography of the chest performed the same day. 2. Circumferential bladder wall thickening, which could reflect cystitis. Correlate with urinalysis. 3. Trace simple free fluid layering in the deep pelvis, and mild body wall edema, suggestive of volume overload. 4. Mild pericholecystic fluid without other findings of acute cholecystitis. Nonspecific in the setting of additional free intraperitoneal fluid. Correlate with abdominal symptoms and consider right upper quadrant ultrasound if there are supportive clinical features. Electronically Signed   By: Lovena Le M.D.   On: 06/23/2019 20:21   US Abdomen Limited  Result Date: 06/23/2019 CLINICAL DATA:  Upper abdominal pain for 2-3 weeks. EXAM: ULTRASOUND ABDOMEN LIMITED RIGHT UPPER QUADRANT COMPARISON:  None. FINDINGS: Gallbladder: No gallstones or wall thickening visualized. Trace amount of pericholecystic fluid is seen. No sonographic Murphy sign noted by sonographer. Common bile duct: Diameter: 0.5 cm Liver: No focal lesion identified. Within normal limits in parenchymal echogenicity. Portal vein is patent on color Doppler imaging with normal direction of blood flow towards the liver. Other: None. IMPRESSION: Negative for gallstones. Trace amount of pericholecystic fluid is nonspecific and may be due to volume overload.  Electronically Signed   By: Inge Rise M.D.  On: 06/23/2019 18:58    Lab Results:  CBC    Component Value Date/Time   WBC 11.2 (H) 06/23/2019 1800   RBC 4.27 06/23/2019 1800   HGB 12.3 06/23/2019 1800   HGB 13.8 04/02/2013 0901   HCT 41.0 06/23/2019 1800   HCT 40.9 04/02/2013 0901   PLT 463 (H) 06/23/2019 1800   PLT 346 04/02/2013 0901   MCV 96.0 06/23/2019 1800   MCV 99 04/02/2013 0901   MCH 28.8 06/23/2019 1800   MCHC 30.0 06/23/2019 1800   RDW 16.9 (H) 06/23/2019 1800   RDW 12.7 04/02/2013 0901   LYMPHSABS 2.0 04/02/2013 0901   MONOABS 0.6 01/16/2013 1806   EOSABS 0.5 04/02/2013 0901   BASOSABS 0.1 04/02/2013 0901    BMET    Component Value Date/Time   NA 136 06/26/2019 0346   K 4.3 06/26/2019 0346   CL 106 06/26/2019 0346   CO2 22 06/26/2019 0346   GLUCOSE 112 (H) 06/26/2019 0346   BUN 18 06/26/2019 0346   CREATININE 1.15 (H) 06/26/2019 0346   CREATININE 0.75 03/31/2013 1536   CALCIUM 8.7 (L) 06/26/2019 0346   GFRNONAA 54 (L) 06/26/2019 0346   GFRAA >60 06/26/2019 0346    BNP    Component Value Date/Time   BNP 1,500.6 (H) 06/23/2019 1800    ProBNP No results found for: PROBNP  Specialty Problems      Pulmonary Problems   Pleuritic chest pain   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   cetirizine (ZYRTEC) 10 MG tablet Take 10 mg by mouth daily.   cholecalciferol (VITAMIN D) 1000 UNITS tablet Take 3,000 Units by mouth daily.    Cyanocobalamin (VITAMIN B-12) 1000 MCG SUBL Place 1,000 mcg under the tongue.   furosemide (LASIX) 20 MG tablet Take 1 tablet (20 mg total) by mouth every other day.   pantoprazole (PROTONIX) 40 MG tablet Take 1 tablet (40 mg total) by mouth daily.   predniSONE (DELTASONE) 20 MG tablet Take 1 tablet (20 mg total) by mouth daily with breakfast.   [DISCONTINUED] naproxen sodium (ALEVE) 220 MG tablet Take 220 mg by mouth 2 (two) times daily as needed (pain).   No facility-administered encounter medications on file as of 07/07/2019.      Review of Systems  Review of Systems  Constitutional: Positive for fatigue. Negative for activity change and fever.  HENT: Negative for sinus pressure, sinus pain and sore throat.   Respiratory:  Negative for cough, shortness of breath and wheezing.   Cardiovascular: Negative for chest pain and palpitations.  Gastrointestinal: Negative for diarrhea, nausea and vomiting.       Continues to have intermittent abdominal pain.  Suspected to be GERD.  She reports primary care has referred her to gastroenterology.  Started on Protonix PPI  Musculoskeletal: Negative for arthralgias.  Neurological: Negative for dizziness.  Psychiatric/Behavioral: Negative for sleep disturbance. The patient is not nervous/anxious.      Physical Exam  BP 118/70    Pulse 70    Temp 98.2 F (36.8 C) (Temporal)    Ht 5\' 11"  (1.803 m)    Wt 135 lb 9.6 oz (61.5 kg)    LMP 02/19/2013    SpO2 94%    BMI 18.91 kg/m   Wt Readings from Last 5  Encounters:  07/07/19 135 lb 9.6 oz (61.5 kg)  06/26/19 136 lb 3 oz (61.8 kg)  06/22/19 140 lb 5 oz (63.6 kg)  06/01/14 150 lb (68 kg)  06/17/13 148 lb 6.4 oz (67.3 kg)    BMI Readings from Last 5 Encounters:  07/07/19 18.91 kg/m  06/26/19 18.99 kg/m  06/22/19 19.03 kg/m  06/01/14 20.34 kg/m  06/17/13 20.70 kg/m     Physical Exam Vitals signs and nursing note reviewed.  Constitutional:      General: She is not in acute distress.    Appearance: Normal appearance. She is normal weight.     Comments: Thin adult female  HENT:     Head: Normocephalic and atraumatic.     Right Ear: Tympanic membrane, ear canal and external ear normal. There is no impacted cerumen.     Left Ear: Tympanic membrane, ear canal and external ear normal. There is no impacted cerumen.     Nose: Nose normal. No congestion or rhinorrhea.     Mouth/Throat:     Mouth: Mucous membranes are moist.     Pharynx: Oropharynx is clear.  Eyes:     Pupils: Pupils are equal, round, and reactive to light.  Neck:     Musculoskeletal: Normal range of motion.  Cardiovascular:     Rate and Rhythm: Normal rate and regular rhythm.     Pulses: Normal pulses.     Heart sounds: Normal heart sounds. No murmur.  Pulmonary:     Effort: Pulmonary effort is normal. No respiratory distress.     Breath sounds: No decreased air movement. Rales (Bibasilar crackles) present. No decreased breath sounds or wheezing.  Abdominal:     General: Abdomen is flat. Bowel sounds are normal. There is no distension.     Palpations: Abdomen is soft. There is no mass.  Musculoskeletal:        General: No swelling or tenderness.     Right lower leg: No edema.     Left lower leg: No edema.  Skin:    General: Skin is warm and dry.     Capillary Refill: Capillary refill takes less than 2 seconds.  Neurological:     General: No focal deficit present.     Mental Status: She is alert and oriented to person, place, and time. Mental status is at  baseline.     Gait: Gait (Tolerated walk in office with no oxygen desaturations, mild tachycardia) normal.  Psychiatric:        Mood and Affect: Mood normal.        Behavior: Behavior normal.        Thought Content: Thought content  normal.        Judgment: Judgment normal.       Assessment & Plan:   Atrial fibrillation with RVR We will place a referral today for patient to be reevaluated by cardiology.  Recent echocardiogram completed while patient was inpatient  Chronic diastolic CHF (congestive heart failure) ([place]) Plan: Continue Lasix Referral to cardiology  Emphysema  Never smoker Thin frail female Moderately reduced DLCO in 2015 pulmonary function test  Plan: Lab work today to rule out alpha-1 antitrypsin deficiency   ILD (interstitial lung disease) Plan: Continue prednisone 20 mg daily Needs outpatient rheumatology appointment Likely will repeat CT chest in 1 year if not sooner based off of patient's clinical status Needs pulmonary function testing 4-week follow-up to establish outpatient care with Dr. 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 Loanne Drilling Pulmonary function test ordered today  Lupus Per patient when she was evaluated by Dr. Estanislado Pandy for previously in the past it was felt that this was a mild case if anything of lupus.  Likely most of patient's rheumatological issues are directly related to rheumatoid arthritis per patient.  Plan: Proceed forward with outpatient rheumatology referral  Rheumatoid arthritis [facility]) Plan: Establish with outpatient rheumatologist Contact our office in 1 week if you have not been scheduled or contacted by rheumatology based off of your primary care referral today  Healthcare maintenance Plan: Patient needs flu vaccine at next office visit Patient also needs Pneumovax  23    Return in about 4 weeks (around 08/04/2019), or if symptoms worsen or fail to improve, for Follow up with Dr. Loanne Drilling, Follow up for PFT.   Lauraine Rinne, NP 07/07/2019   This appointment was 46 minutes long with over 50% of the time in direct face-to-face patient care, assessment, plan of care, and follow-up.  I have also contacted Dr. Loanne Drilling via telephone discussed this work-up with her.  She agrees with work-up as stated.

## 2019-07-07 NOTE — Assessment & Plan Note (Signed)
We will place a referral today for patient to be reevaluated by cardiology.  Recent echocardiogram completed while patient was inpatient

## 2019-07-07 NOTE — Assessment & Plan Note (Signed)
Plan: Establish with outpatient rheumatologist Contact our office in 1 week if you have not been scheduled or contacted by rheumatology based off of your primary care referral today

## 2019-07-08 ENCOUNTER — Telehealth: Payer: Self-pay

## 2019-07-08 ENCOUNTER — Inpatient Hospital Stay: Payer: BC Managed Care – PPO | Admitting: Pulmonary Disease

## 2019-07-08 NOTE — Telephone Encounter (Signed)
RTN'D PT CALL TO SCHEDULE NEW PATIENT APPT NO ANS LVM TO CALL OFC

## 2019-07-09 ENCOUNTER — Other Ambulatory Visit: Payer: Self-pay

## 2019-07-09 ENCOUNTER — Encounter: Payer: Self-pay | Admitting: Gastroenterology

## 2019-07-09 DIAGNOSIS — Z20822 Contact with and (suspected) exposure to covid-19: Secondary | ICD-10-CM

## 2019-07-10 LAB — NOVEL CORONAVIRUS, NAA: SARS-CoV-2, NAA: NOT DETECTED

## 2019-07-11 LAB — ALPHA-1 ANTITRYPSIN PHENOTYPE: A-1 Antitrypsin, Ser: 152 mg/dL (ref 83–199)

## 2019-07-13 ENCOUNTER — Telehealth: Payer: Self-pay | Admitting: Pulmonary Disease

## 2019-07-13 NOTE — Progress Notes (Signed)
Alpha-1 levels are normal.  This is good news.  Phenotype is also normal.  No changes.  Have you been scheduled with rheumatology yet?  If so which one?  Please remember to have them fax their note to our office with the attention to Dr. Loanne Drilling as well as myself.  Keep follow-up with Dr. Clemens Catholic FNP

## 2019-07-13 NOTE — Telephone Encounter (Signed)
Rec'd completed paperwork back from [place] to [employer] via [employer] -pr

## 2019-07-14 ENCOUNTER — Telehealth: Payer: Self-pay | Admitting: Pulmonary Disease

## 2019-07-14 NOTE — Telephone Encounter (Signed)
See pt's email, she has a rheumatology appt scheduled for 07/28/2019.  Pt wanted to inform [facility].

## 2019-07-14 NOTE — Telephone Encounter (Signed)
Very happy to hear.  Which rheumatologist was she established with?  Can you also remind her to make sure that rheumatology route to records with the attention to Dr. Loanne Drilling.  Wyn Quaker FNP

## 2019-07-14 NOTE — Telephone Encounter (Signed)
Thank you   B

## 2019-07-14 NOTE — Progress Notes (Signed)
Can also offer patient for [place] to put in a referral to rheumatology.  If she would like for [place] to do that then go ahead and place an order for rheumatology underneath my name.  Primary care which was placed at a [facility] had placed the original order for rheumatology.  I would also encourage the patient to follow-up with primary care Dr. Valora Piccolo who ordered the original order.   It is very important the patient get scheduled with rheumatology.  Wyn Quaker, FNP

## 2019-07-14 NOTE — Telephone Encounter (Signed)
Notes recorded by Lauraine Rinne, NP on 07/13/2019 at 9:38 AM EDT  Alpha-1 levels are normal. This is good news. Phenotype is also normal.   No changes.   Have you been scheduled with rheumatology yet? If so which one? Please remember to have them fax their note to our office with the attention to Dr. Loanne Drilling as well as myself.   Keep follow-up with Dr. Clemens Catholic FNP --------------------------------------------------------- Spoke with pt. She is aware of results. States that she has not set up an appointment with Rheumatology yet. She is trying to make the appointment before she goes back to work. States she will let us know once she makes the appointment. Nothing further was needed.

## 2019-07-14 NOTE — Telephone Encounter (Signed)
Per pts email - she is seeing Dr. Posey Pronto in [place] [facility]).

## 2019-07-22 ENCOUNTER — Other Ambulatory Visit: Payer: Self-pay

## 2019-07-22 MED ORDER — PREDNISONE 20 MG PO TABS: 20.0000 mg | ORAL_TABLET | Freq: Every day | ORAL | 0 refills | Status: DC

## 2019-07-22 NOTE — Telephone Encounter (Signed)
Received MyChart message for patient stating that she will be out of prednisone before her appointment with rheumatology. She is scheduled to see them on October 20th. Per her medication list, she is currently on 20mg  once daily.   Aaron Edelman, are you ok with [place] sending in enough to last until her appt? Please advise.

## 2019-07-23 ENCOUNTER — Encounter: Payer: Self-pay | Admitting: *Deleted

## 2019-07-23 NOTE — Telephone Encounter (Signed)
Can my return to work be Wednesday 21th because I have two appointments the 19th  Aaron Edelman, are you ok with 10/21 date?

## 2019-07-23 NOTE — Telephone Encounter (Signed)
Yes that is fine, whatever works best for her.   Wyn Quaker FNP

## 2019-07-23 NOTE — Telephone Encounter (Signed)
Carolyn Proctor patient sent email this morning asking for a return to work note.   Can we create this and if so what are the dates of which patient can return to work.  Carolyn Proctor please advise

## 2019-07-23 NOTE — Telephone Encounter (Signed)
Yes absolutely.   Carolyn Proctor

## 2019-07-24 NOTE — Telephone Encounter (Signed)
Brian - please advise. Thanks. 

## 2019-07-27 NOTE — Telephone Encounter (Signed)
07/27/2019 0904  I believe it would be reasonable for you to slowly increase your overall physical activity.  I would limit heavy lifting above 15 to 20 pounds.  Also you should have the ability to limit your overall physical activity based off of your tolerance and physical exertion.  I cannot think of any major significant limitations for walking or cardiovascular activity.  If you start to feel more short of breath or dyspneic or started to have more acute symptoms then please contact your office and we can discuss this and we may add to a work note or give more specific details.  Okay to generate note specifically stating that patient should not have heavy lifting above 15 to 20 pounds.  Patient should be able to advance her activity as tolerated.  If she starts to have worsening shortness of breath, chest pain or acute symptoms she will contact our office and she needs to avoid those activities that caused the symptoms.  Patient is actively being worked up by rheumatology as well as pulmonary.  This is an ongoing process.  Wyn Quaker FNP

## 2019-08-04 ENCOUNTER — Other Ambulatory Visit: Payer: Self-pay

## 2019-08-04 ENCOUNTER — Ambulatory Visit (INDEPENDENT_AMBULATORY_CARE_PROVIDER_SITE_OTHER): Payer: BC Managed Care – PPO | Admitting: Pulmonary Disease

## 2019-08-04 ENCOUNTER — Encounter: Payer: Self-pay | Admitting: Pulmonary Disease

## 2019-08-04 VITALS — BP 106/60 | HR 94 | Temp 97.2°F | Ht 71.0 in | Wt 139.6 lb

## 2019-08-04 DIAGNOSIS — I519 Heart disease, unspecified: Secondary | ICD-10-CM | POA: Diagnosis not present

## 2019-08-04 DIAGNOSIS — J8489 Other specified interstitial pulmonary diseases: Secondary | ICD-10-CM | POA: Diagnosis not present

## 2019-08-04 DIAGNOSIS — M0510 Rheumatoid lung disease with rheumatoid arthritis of unspecified site: Secondary | ICD-10-CM | POA: Diagnosis not present

## 2019-08-04 DIAGNOSIS — M359 Systemic involvement of connective tissue, unspecified: Secondary | ICD-10-CM

## 2019-08-04 NOTE — Progress Notes (Signed)
Subjective:   PATIENT ID: Carolyn Proctor GENDER: female DOB: 1961-05-26, MRN: [id number]   HPI  Chief Complaint  Patient presents with  . Follow-up    hoarseness   Reason for Visit: Follow-up  [name] is a 58 year old female never smoker with RA-ILD, history of lupus, atrial fibrillation on anticoagulation and hx of myocarditis who presents for follow-up  She was previously seen by me as an inpatient consult for acute hypoxemic respiratory failure and CT findings demonstrating progressive fibrotic lung disease.  Her CT also demonstrated evidence of volume overload at the time and she was diuresed. She has never been on bronchodilators and was previously followed by Dr. Chase Caller in ILD clinic with the last visit in 05/2014.  At that time she was asymptomatic from a pulmonary standpoint and scheduled for annual PFTs however she was lost to follow-up.  For her RA, she was previously seen by Dr. Estanislado Pandy rheumatology and was on Plaquenil and prednisone but is not currently on any of those medications as she has not seen a rheumatologist in a few years.  In that time interval she has had progressive joint pain and changes in her hands and toes.  The last month she is noticed that she has had more shortness of breath with exertion but is noticed this is improved with diuresis.  She was discharged on oxygen, prednisone taper and PPI.   Since discharge, she has been more active. Her oxygen level ranges from 93-96%. Not wearing oxygen now. She has returned to work at [employer]. Her energy level has returned. She has shortness of breath with exertion but this is improved compared to when she was hospitalized. She continues to take her lasix every other day and has not noticed any lower extremity swelling, weight gain. Denies cough, chest pain, wheezing.   She is seeing Dr. Posey Pronto in [facility]) and is working on approval for Rituxan. Until  then, she continues to remain on prednisone 20 mg daily.  I have personally reviewed patient's past medical/family/social history, allergies, current medications.  Past Medical History:  Diagnosis Date  . Atrial fibrillation ([place])   . Chronic anticoagulation, Xarelto 04/02/2013  . Diverticulitis   . Hypertension   . Lupus ([place]) 09/14/2011  . 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 Thrombocytosis ([place]) 09/14/2011     Family History  Problem Relation Age of Onset  . Lupus Mother   . Liver disease Mother   . Hypertension Maternal Grandmother   . Lupus Brother      Social History   Occupational History  . Not on file  Tobacco Use  . Smoking status: Never Smoker  . Smokeless tobacco: Never Used  Substance and Sexual Activity  . Alcohol use: No  . Drug use: No  . Sexual activity: Never    No Known Allergies   Outpatient Medications Prior to Visit  Medication Sig Dispense Refill  . albuterol (VENTOLIN HFA) 108 (90 Base) MCG/ACT inhaler Inhale 2 puffs into the lungs every 6 (six) hours as needed for wheezing or shortness of breath. 8 g 2  . cetirizine (ZYRTEC) 10 MG tablet Take 10 mg by mouth daily.    . cholecalciferol (VITAMIN D) 1000 UNITS tablet Take 3,000 Units by mouth daily.     . Cyanocobalamin (VITAMIN B-12) 1000 MCG SUBL Place 1,000 mcg under the tongue.    . furosemide (LASIX) 20 MG tablet Take 1 tablet (20 mg total) by mouth  every other day. 45 tablet 0  . pantoprazole (PROTONIX) 40 MG tablet Take 1 tablet (40 mg total) by mouth daily. 90 tablet 0  . predniSONE (DELTASONE) 20 MG tablet Take 1 tablet (20 mg total) by mouth daily with breakfast. 30 tablet 0   No facility-administered medications prior to visit.     Review of Systems  Constitutional: Negative for chills, diaphoresis, fever, malaise/fatigue and weight loss.  HENT: Negative for congestion, ear pain and sore throat.   Respiratory: Positive for shortness of breath. Negative for cough, hemoptysis, sputum  production and wheezing.   Cardiovascular: Negative for chest pain, palpitations and leg swelling.  Gastrointestinal: Negative for abdominal pain, heartburn and nausea.  Genitourinary: Negative for frequency.  Musculoskeletal: Negative for joint pain and myalgias.  Skin: Negative for itching and rash.  Neurological: Negative for dizziness, weakness and headaches.  Endo/Heme/Allergies: Does not bruise/bleed easily.  Psychiatric/Behavioral: Negative for depression. The patient is not nervous/anxious.      Objective:   Vitals:   08/04/19 0917 08/04/19 0918  BP:  106/60  Pulse:  94  Temp: (!) 97.2 F (36.2 C)   TempSrc: Temporal   SpO2:  96%  Weight: 139 lb 9.6 oz (63.3 kg)   Height: 5\' 11"  (1.803 m)       Physical Exam: General: Thin, well-appearing, no acute distress HENT: [place], AT Eyes: EOMI, no scleral icterus Respiratory: Fine velcro crackles on inspiration. No wheezing. Cardiovascular: RRR, -M/R/G, no JVD GI: BS+, soft, nontender Extremities: Ulnar deviation of hands, diffuse joint swelling including left 3rd digit MCP of the hand Neuro: AAO x4, CNII-XII grossly intact Skin: Intact, no rashes or bruising Psych: Normal mood, normal affect  Data Reviewed:  Imaging: 06/23/19 CTA - No PE, mild pericardial effusion, diffuse emphysema, bibasilar pulmonary edema and effusion. Significant progressive subpleural fibrotic changes including honeycombing, diffuse involvement but predominantly worse at the lung bases 06/23/19 CT A/P - Mild pericholecystic fluid, trace simple free fluid in deep pelvis and mild body wall edema, circumferential bladder wall thickening  [facility] XR Extremities Left hand: Erosive arthropathy involving the 2nd 3rd and 4th PIP, 1st 2nd and 3rd MCP, CMC, intercarpal, and radiocarpal joints with erosions of the distal radius and ulnar styloid.  Right hand: Erosive arthropathy involving the 3rd DIP, 2nd 3rd 4th and 5th PIP, 1st through 5th MCP joints.  Radiocarpal fusion/ankylosis with erosive changes involving the lateral intercarpal joints. Erosions of the ulnar styloid. Valgus deviation of the 3rd PIP joint and varus deviation of the 4th PIP joint.  Left foot: Erosive arthropathy involving the IP joint of the great toe and 2nd through 5th MTP joints. Hallux valgus.  Right foot: Erosive arthropathy involving the IP joint of the great toe and 1st through 5th MTP joints.   IMPRESSION: FINDINGS compatible with severe rheumatoid arthritis.  PFT: None on file  Labs: [facility] 07/28/19 RNP ab >8 Smith ab >1 SSA 2.2 SSB <0.2 ANA 1:320 RA quant 72.6 CCP >250 C4 20  Imaging, labs and tests noted above have been reviewed independently by me.    Assessment & Plan:   Discussion: 58 year old female never smoker with RA-ILD with progressive bibasilar subpleural fibrotic lung disease with honeycombing and findings similar to paraseptal emphysema. She would likely benefit from anti-fibrotic agent. Currently being seen by Rheumatology with plans to initiate Rituxan. Will plan to continue prednisone until Rituxan is started.  RA-ILD --Will start enrollment for Ofev due to progressive fibrotic lung disease. I will send message to our pharmacy  team --Continue prednisone 20 mg daily until starting immunosuppressant therapy with Rheum. After that, will start slow taper. If unable to taper while on immunosuppressant, she will need to be started on PCP prophylaxis --Pulmonary function tests to be ordered prior to next visit --RA management per Dr. Posey Pronto at [facility] in [place]  Rheumatic mitral valve disease Pericardial effusion Moderately reduced systolic RV dysfunction with moderate hypokinesis Chronic diastolic heart failure --Referral to Cardiology --Will send message to your Cardiologist regarding potential RHC to rule out pulmonary hypertension  Chronic hypoxemic respiratory failure - improved  --Continue supplemental oxygen for goal SpO2 >88%  Health Maintenance There is no immunization history for the selected administration types on file for this patient. Patient declined influenza and pneumococcal until she knows the status of her Rituxan  No orders of the defined types were placed in this encounter. No orders of the defined types were placed in this encounter.  Return in about 3 months (around 11/04/2019).  Greater than 50% of this patient 25-minute office visit was spent face-to-face in counseling with the patient/family. We discussed medical diagnosis and treatment plan as noted.  [place], MD [facility] 08/04/2019 8:10 AM  Office Number [phone]

## 2019-08-04 NOTE — Patient Instructions (Addendum)
Chronic hypoxemic respiratory failure - improved --Continue supplemental oxygen for goal SpO2 >88%  RA-ILD --Pulmonary function tests asap --RA management per Dr. Posey Pronto at [facility] in [place] Continue prednisone  Rheumatic mitral valve disease Pericardial effusion Moderately reduced systolic RV dysfunction with moderate hypokinesis Chronic diastolic heart failure --Referred to Cardiology on last clinic visit --Will send message to your Cardiologist regarding potential RHC to rule out pulmonary hypertension  Follow-up in 3 months with Dr. Loanne Drilling PFT and follow up with Wyn Quaker next available.

## 2019-08-05 DIAGNOSIS — I519 Heart disease, unspecified: Secondary | ICD-10-CM | POA: Insufficient documentation

## 2019-08-05 HISTORY — DX: Heart disease, unspecified: I51.9

## 2019-08-06 ENCOUNTER — Telehealth: Payer: Self-pay | Admitting: Pharmacist

## 2019-08-06 NOTE — Telephone Encounter (Signed)
Will start benefits investigation for Ofev and update when we hear a response.   Mariella Saa, PharmD, [place], [place] Clinical Specialty Pharmacist [phone]  08/06/2019 8:12 AM

## 2019-08-06 NOTE — Telephone Encounter (Signed)
-----   Message from [place], MD sent at 08/05/2019  7:21 PM EDT ----- Luetta Nutting, can you see if this patient qualifies for any programs for Ofev?Opal Sidles

## 2019-08-07 NOTE — Telephone Encounter (Signed)
Submitted a Prior Authorization request to [employer] for Ofev via Cover My Meds. Will update once we receive a response.  Key: Kathleene Hazel, PharmD, [place], CPP Clinical Specialty Pharmacist [phone]  08/07/2019 2:39 PM

## 2019-08-10 ENCOUNTER — Ambulatory Visit: Payer: BC Managed Care – PPO | Admitting: Gastroenterology

## 2019-08-11 NOTE — Telephone Encounter (Signed)
Received notification regarding Prior Authorization from [facility] for Ofev. Authorization has been DENIED.  Pharmacy team will take care of Appeal, Please place Denial in Pharmacy team folder

## 2019-08-14 ENCOUNTER — Encounter: Payer: Self-pay | Admitting: Pharmacist

## 2019-08-16 NOTE — H&P (View-Only) (Signed)
Cardiology Office Note:    Date:  08/16/2019   ID:  Carolyn Proctor, DOB 01-24-1961, MRN [id number]  PCP:  Carolyn Coma., MD  Cardiologist:  No primary care provider on file.  Electrophysiologist:  None   Referring MD: Carolyn Rinne, NP   No chief complaint on file. AFib  History of Present Illness:    Carolyn Proctor is a 58 y.o. female with a hx of myopericardiis in 2014.  History as follows per Dr. 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 She appears to have recovered completely from an episode of acute myopericarditis. Her rheumatological workup does not suggest likely association with myopericarditis. Has seen Carolyn Proctor. "  She had PAF in 2014 and was on full dose anticoagulation at that time.    Referred now for possible RHC to eval for pulm HTN.  06/2019 echo showed: " 1. The left ventricle has normal systolic function with an ejection fraction of 60-65%. The cavity size was normal. There is mildly increased left ventricular wall thickness. Left ventricular diastolic Doppler parameters are consistent with impaired  relaxation. Indeterminate filling pressures.  2. The right ventricle has moderately reduced systolic function. The cavity was moderately enlarged. There is no increase in right ventricular wall thickness.  3. Moderate hypokinesis of the basal to mid RV with relative sparing of the apex, consistent with McConnell's sign.  4. Small pericardial effusion.  5. The pericardial effusion is circumferential.  6. The mitral valve is rheumatic. Mild thickening of the mitral valve leaflet. Mild calcification of the mitral valve leaflet. There is moderate mitral annular calcification present.  7.  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 Mild prolapse of the anterior and posterior mitral valve leaflets.  8. Tricuspid valve regurgitation is moderate.  9. The aortic valve is tricuspid. No stenosis of the aortic valve. 10. The aorta is normal unless otherwise noted."  Hospitalized for volume overload, diastolic heart failure.  Now off of oxygen.  Also noted to have worsening ILD.  Anticoagulation was stopped due to a bleeding problem with her menstrual cycle.   Since hospital discharge, Denies recent Chest pain. Dizziness. Leg edema. Nitroglycerin use. Orthopnea. Palpitations. Paroxysmal nocturnal dyspnea. Shortness of breath. Syncope.    Past Medical History:  Diagnosis Date  . Acute myopericarditis 02/24/2013  . Atrial fibrillation ([place])   . Atrial fibrillation with RVR ([place]) 02/22/2013  . Chronic anticoagulation, Xarelto 04/02/2013  . Chronic diastolic CHF (congestive heart failure) ([place]) 06/26/2019  . Diverticulitis   . Elevated troponin- secondary to Myopericarditis 02/25/2013  . Emphysema  07/07/2019  . Hypertension   . Lupus ([place]) 09/14/2011  . 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 PAF (paroxysmal atrial fibrillation) ([place]) 04/02/2013  . Pleuritic chest pain 02/22/2013  . 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 Rheumatoid arthritis ([place]) 07/07/2019  . 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 Right ventricular dysfunction 08/05/2019  . Thrombocytosis ([place]) 09/14/2011    Past Surgical History:  Procedure Laterality Date  . BONE MARROW BIOPSY  2010  . CARDIAC CATHETERIZATION    .  LEFT HEART CATHETERIZATION WITH CORONARY ANGIOGRAM N/A 02/24/2013   Procedure: LEFT HEART CATHETERIZATION WITH CORONARY ANGIOGRAM;  Surgeon: Carolyn Man, MD;  Location[facility] C[facility] LAB;   Service: Cardiovascular;  Laterality: N/A;    Current Medications: No outpatient medications have been marked as taking for the 08/17/19 encounter (Appointment) with Carolyn Booze, MD.     Allergies:   Patient has no known allergies.   Social History   Socioeconomic History  . Marital status: Divorced    Spouse name: Not on file  . Number of children: Not on file  . Years of education: Not on file  . Highest education level: Not on file  Occupational History  . Not on file  Social Needs  . Financial resource strain: Not on file  . Food insecurity    Worry: Not on file    Inability: Not on file  . Transportation needs    Medical: Not on file    Non-medical: Not on file  Tobacco Use  . Smoking status: Never Smoker  . Smokeless tobacco: Never Used  Substance and Sexual Activity  . Alcohol use: No  . Drug use: No  . Sexual activity: Never  Lifestyle  . Physical activity    Days per week: Not on file    Minutes per session: Not on file  . Stress: Not on file  Relationships  . Social Herbalist on phone: Not on file    Gets together: Not on file    Attends religious service: Not on file    Active member of club or organization: Not on file    Attends meetings of clubs or organizations: Not on file    Relationship status: Not on file  Other Topics Concern  . Not on file  Social History Narrative  . Not on file     Family History: The patient's family history includes Hypertension in her maternal grandmother; Liver disease in her mother; Lupus in her brother and mother.  ROS:   Please see the history of present illness.     All other systems reviewed and are negative.  EKGs/Labs/Other Studies Reviewed:    The following studies were reviewed today: Echo reviewed as well  EKG:  EKG is  ordered today.  The ekg ordered today demonstrates NSR, LAE, PAC, no ST changes  Recent Labs: 06/23/2019: ALT 18; B Natriuretic Peptide 1,500.6; Hemoglobin 12.3;  Platelets 463 06/26/2019: BUN 18; Creatinine, Ser 1.15; Potassium 4.3; Sodium 136  Recent Lipid Panel    Component Value Date/Time   CHOL  07/18/2007 0305    137        ATP III CLASSIFICATION:  <200     mg/dL   Desirable  200-239  mg/dL   Borderline High  >=240    mg/dL   High   TRIG 48 07/18/2007 0305   HDL 39 (L) 07/18/2007 0305   CHOLHDL 3.5 07/18/2007 0305   VLDL 10 07/18/2007 0305   LDLCALC  07/18/2007 0305    88        Total Cholesterol/HDL:CHD Risk Coronary Heart Disease Risk Table                     Men   Women  1/2 Average Risk   3.4   3.3    Physical Exam:    VS:  LMP 02/19/2013     Wt Readings from Last 3 Encounters:  08/04/19 139 lb 9.6 oz (63.3 kg)  07/07/19 135 lb 9.6 oz (61.5 kg)  06/26/19 136 lb 3 oz (61.8 kg)     GEN:  Well nourished, well developed in no acute distress HEENT: Normal NECK: No JVD; No carotid bruits LYMPHATICS: No lymphadenopathy CARDIAC: RRR, no murmurs, rubs, gallops RESPIRATORY:  Clear to auscultation without rales, wheezing or rhonchi  ABDOMEN: Soft, non-tender, non-distended MUSCULOSKELETAL:  No edema; No deformity  SKIN: Warm and dry NEUROLOGIC:  Alert and oriented x 3 PSYCHIATRIC:  Normal affect   ASSESSMENT:    No diagnosis found. PLAN:    In order of problems listed above:  1. PAF: in NSR.  Not on anticoagulation.  Maintaining NSR at this time.  2. Abnormal echo:  Had diastolic heart failure as well with volume overload.  Pulm HTN is a concern. Plan left and right heart cath.  Planned for 12/1.  Planned right radial approach. 3. Mitral valve disease: CHF under better control. Cardiac catheterization was discussed with the patient fully. The patient understands that risks include but are not limited to stroke (1 in 1000), death (1 in 67), kidney failure [usually temporary] (1 in 500), bleeding (1 in 200), allergic reaction [possibly serious] (1 in 200).  The patient understands and is willing to proceed.       Medication Adjustments/Labs and Tests Ordered: Current medicines are reviewed at length with the patient today.  Concerns regarding medicines are outlined above.  No orders of the defined types were placed in this encounter.  No orders of the defined types were placed in this encounter.   There are no Patient Instructions on file for this visit.   Signed, Larae Grooms, MD  08/16/2019 10:54 PM    [place]

## 2019-08-16 NOTE — Progress Notes (Signed)
Cardiology Office Note:    Date:  08/16/2019   ID:  Carolyn Proctor, DOB 01-24-1961, MRN [id number]  PCP:  Lilian Coma., MD  Cardiologist:  No primary care provider on file.  Electrophysiologist:  None   Referring MD: Lauraine Rinne, NP   No chief complaint on file. AFib  History of Present Illness:    Carolyn Proctor is a 58 y.o. female with a hx of myopericardiis in 2014.  History as follows per Dr. 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 She appears to have recovered completely from an episode of acute myopericarditis. Her rheumatological workup does not suggest likely association with myopericarditis. Has seen Dr.Deveshwar. "  She had PAF in 2014 and was on full dose anticoagulation at that time.    Referred now for possible RHC to eval for pulm HTN.  06/2019 echo showed: " 1. The left ventricle has normal systolic function with an ejection fraction of 60-65%. The cavity size was normal. There is mildly increased left ventricular wall thickness. Left ventricular diastolic Doppler parameters are consistent with impaired  relaxation. Indeterminate filling pressures.  2. The right ventricle has moderately reduced systolic function. The cavity was moderately enlarged. There is no increase in right ventricular wall thickness.  3. Moderate hypokinesis of the basal to mid RV with relative sparing of the apex, consistent with McConnell's sign.  4. Small pericardial effusion.  5. The pericardial effusion is circumferential.  6. The mitral valve is rheumatic. Mild thickening of the mitral valve leaflet. Mild calcification of the mitral valve leaflet. There is moderate mitral annular calcification present.  7.  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 Mild prolapse of the anterior and posterior mitral valve leaflets.  8. Tricuspid valve regurgitation is moderate.  9. The aortic valve is tricuspid. No stenosis of the aortic valve. 10. The aorta is normal unless otherwise noted."  Hospitalized for volume overload, diastolic heart failure.  Now off of oxygen.  Also noted to have worsening ILD.  Anticoagulation was stopped due to a bleeding problem with her menstrual cycle.   Since hospital discharge, Denies recent Chest pain. Dizziness. Leg edema. Nitroglycerin use. Orthopnea. Palpitations. Paroxysmal nocturnal dyspnea. Shortness of breath. Syncope.    Past Medical History:  Diagnosis Date  . Acute myopericarditis 02/24/2013  . Atrial fibrillation ([place])   . Atrial fibrillation with RVR ([place]) 02/22/2013  . Chronic anticoagulation, Xarelto 04/02/2013  . Chronic diastolic CHF (congestive heart failure) ([place]) 06/26/2019  . Diverticulitis   . Elevated troponin- secondary to Myopericarditis 02/25/2013  . Emphysema  07/07/2019  . Hypertension   . Lupus ([place]) 09/14/2011  . 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 PAF (paroxysmal atrial fibrillation) ([place]) 04/02/2013  . Pleuritic chest pain 02/22/2013  . 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 Rheumatoid arthritis ([place]) 07/07/2019  . 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 Right ventricular dysfunction 08/05/2019  . Thrombocytosis ([place]) 09/14/2011    Past Surgical History:  Procedure Laterality Date  . BONE MARROW BIOPSY  2010  . CARDIAC CATHETERIZATION    .  LEFT HEART CATHETERIZATION WITH CORONARY ANGIOGRAM N/A 02/24/2013   Procedure: LEFT HEART CATHETERIZATION WITH CORONARY ANGIOGRAM;  Surgeon: Leonie Man, MD;  Location[facility] C[facility] LAB;   Service: Cardiovascular;  Laterality: N/A;    Current Medications: No outpatient medications have been marked as taking for the 08/17/19 encounter (Appointment) with Jettie Booze, MD.     Allergies:   Patient has no known allergies.   Social History   Socioeconomic History  . Marital status: Divorced    Spouse name: Not on file  . Number of children: Not on file  . Years of education: Not on file  . Highest education level: Not on file  Occupational History  . Not on file  Social Needs  . Financial resource strain: Not on file  . Food insecurity    Worry: Not on file    Inability: Not on file  . Transportation needs    Medical: Not on file    Non-medical: Not on file  Tobacco Use  . Smoking status: Never Smoker  . Smokeless tobacco: Never Used  Substance and Sexual Activity  . Alcohol use: No  . Drug use: No  . Sexual activity: Never  Lifestyle  . Physical activity    Days per week: Not on file    Minutes per session: Not on file  . Stress: Not on file  Relationships  . Social Herbalist on phone: Not on file    Gets together: Not on file    Attends religious service: Not on file    Active member of club or organization: Not on file    Attends meetings of clubs or organizations: Not on file    Relationship status: Not on file  Other Topics Concern  . Not on file  Social History Narrative  . Not on file     Family History: The patient's family history includes Hypertension in her maternal grandmother; Liver disease in her mother; Lupus in her brother and mother.  ROS:   Please see the history of present illness.     All other systems reviewed and are negative.  EKGs/Labs/Other Studies Reviewed:    The following studies were reviewed today: Echo reviewed as well  EKG:  EKG is  ordered today.  The ekg ordered today demonstrates NSR, LAE, PAC, no ST changes  Recent Labs: 06/23/2019: ALT 18; B Natriuretic Peptide 1,500.6; Hemoglobin 12.3;  Platelets 463 06/26/2019: BUN 18; Creatinine, Ser 1.15; Potassium 4.3; Sodium 136  Recent Lipid Panel    Component Value Date/Time   CHOL  07/18/2007 0305    137        ATP III CLASSIFICATION:  <200     mg/dL   Desirable  200-239  mg/dL   Borderline High  >=240    mg/dL   High   TRIG 48 07/18/2007 0305   HDL 39 (L) 07/18/2007 0305   CHOLHDL 3.5 07/18/2007 0305   VLDL 10 07/18/2007 0305   LDLCALC  07/18/2007 0305    88        Total Cholesterol/HDL:CHD Risk Coronary Heart Disease Risk Table                     Men   Women  1/2 Average Risk   3.4   3.3    Physical Exam:    VS:  LMP 02/19/2013     Wt Readings from Last 3 Encounters:  08/04/19 139 lb 9.6 oz (63.3 kg)  07/07/19 135 lb 9.6 oz (61.5 kg)  06/26/19 136 lb 3 oz (61.8 kg)     GEN:  Well nourished, well developed in no acute distress HEENT: Normal NECK: No JVD; No carotid bruits LYMPHATICS: No lymphadenopathy CARDIAC: RRR, no murmurs, rubs, gallops RESPIRATORY:  Clear to auscultation without rales, wheezing or rhonchi  ABDOMEN: Soft, non-tender, non-distended MUSCULOSKELETAL:  No edema; No deformity  SKIN: Warm and dry NEUROLOGIC:  Alert and oriented x 3 PSYCHIATRIC:  Normal affect   ASSESSMENT:    No diagnosis found. PLAN:    In order of problems listed above:  1. PAF: in NSR.  Not on anticoagulation.  Maintaining NSR at this time.  2. Abnormal echo:  Had diastolic heart failure as well with volume overload.  Pulm HTN is a concern. Plan left and right heart cath.  Planned for 12/1.  Planned right radial approach. 3. Mitral valve disease: CHF under better control. Cardiac catheterization was discussed with the patient fully. The patient understands that risks include but are not limited to stroke (1 in 1000), death (1 in 67), kidney failure [usually temporary] (1 in 500), bleeding (1 in 200), allergic reaction [possibly serious] (1 in 200).  The patient understands and is willing to proceed.       Medication Adjustments/Labs and Tests Ordered: Current medicines are reviewed at length with the patient today.  Concerns regarding medicines are outlined above.  No orders of the defined types were placed in this encounter.  No orders of the defined types were placed in this encounter.   There are no Patient Instructions on file for this visit.   Signed, Larae Grooms, MD  08/16/2019 10:54 PM    [place]

## 2019-08-17 ENCOUNTER — Encounter: Payer: Self-pay | Admitting: Interventional Cardiology

## 2019-08-17 ENCOUNTER — Other Ambulatory Visit: Payer: Self-pay

## 2019-08-17 ENCOUNTER — Ambulatory Visit (INDEPENDENT_AMBULATORY_CARE_PROVIDER_SITE_OTHER): Payer: BC Managed Care – PPO | Admitting: Interventional Cardiology

## 2019-08-17 VITALS — BP 102/68 | HR 93 | Ht 71.0 in | Wt 141.2 lb

## 2019-08-17 DIAGNOSIS — I059 Rheumatic mitral valve disease, unspecified: Secondary | ICD-10-CM | POA: Diagnosis not present

## 2019-08-17 DIAGNOSIS — I480 Paroxysmal atrial fibrillation: Secondary | ICD-10-CM | POA: Diagnosis not present

## 2019-08-17 DIAGNOSIS — R931 Abnormal findings on diagnostic imaging of heart and coronary circulation: Secondary | ICD-10-CM | POA: Diagnosis not present

## 2019-08-17 DIAGNOSIS — I272 Pulmonary hypertension, unspecified: Secondary | ICD-10-CM

## 2019-08-17 NOTE — Patient Instructions (Addendum)
Medication Instructions:  Your physician recommends that you continue on your current medications as directed. Please refer to the Current Medication list given to you today.  If you need a refill on your cardiac medications before your next appointment, please call your pharmacy.   Lab work: TODAY: CBC, BMET  If you have labs (blood work) drawn today and your tests are completely normal, you will receive your results only by:  [place] (if you have MyChart) OR  A paper copy in the mail If you have any lab test that is abnormal or we need to change your treatment, we will call you to review the results.  Testing/Procedures: Your physician has requested that you have a cardiac catheterization. Cardiac catheterization is used to diagnose and/or treat various heart conditions. Doctors may recommend this procedure for a number of different reasons. The most common reason is to evaluate chest pain. Chest pain can be a symptom of coronary artery disease (CAD), and cardiac catheterization can show whether plaque is narrowing or blocking your hearts arteries. This procedure is also used to evaluate the valves, as well as measure the blood flow and oxygen levels in different parts of your heart. For further information please visit [email or web address]. Please follow instruction sheet, as given.  Follow-Up:  Follow up with Dr. Irish Lack on 09/22/19 at 2:20 PM  Any Other Special Instructions Will Be Listed Below (If Applicable).     [facility] OFFICE [place], [place] [address] Dept: [phone] Loc: [place]  08/17/2019  You are scheduled for a Cardiac Catheterization on Wednesday, December 2 with Dr. Larae Grooms.  1. Please arrive at the [facility] (Main Entrance A) at [facility]: [name] [place], [place] [address] at 6:30 AM (This time is  two hours before your procedure to ensure your preparation). Free valet parking service is available.   Special note: Every effort is made to have your procedure done on time. Please understand that emergencies sometimes delay scheduled procedures.  2. Diet: Do not eat solid foods after midnight.  The patient may have clear liquids until 5am upon the day of the procedure.  3. Labs: TODAY: CBC, BMET  Your Pre-procedure COVID-19 Testing will be done on 09/07/19 at 10:05 AM at [facility] at [id number], [place], [place] [address]. Once you arrive at the testing site, stay in the right hand lane, go under the building overhang not the tent. If you are tested under the tent your results may not be back before your procedure. Please be on time for your appointment.  After your swab you will be given a mask to wear and instructed to go home and quarantine/no visitors until after your procedure. If you test positive you will be notified and your procedure will be cancelled.    4. Medication instructions in preparation for your procedure:   Contrast Allergy: No   DO NOT take furosemide (lasix) the morning of your procedure  On the morning of your procedure, take a baby  Aspirin 81 mg and any morning medicines NOT listed above.  You may use sips of water.  5. Plan for one night stay--bring personal belongings. 6. Bring a current list of your medications and current insurance cards. 7. You MUST have a responsible person to drive you home. 8. Someone MUST be with you the first 24 hours after you  arrive home or your discharge will be delayed. 9. Please wear clothes that are easy to get on and off and wear slip-on shoes.  Thank you for allowing [place] to care for you!   -- [facility]   Coronary Angiogram A coronary angiogram is an X-ray procedure that is used to examine the arteries in the heart. In this procedure, a dye (contrast dye)  is injected through a long, thin tube (catheter). The catheter is inserted through the groin, wrist, or arm. The dye is injected into each artery, then X-rays are taken to show if there is a blockage in the arteries of the heart. This procedure can also show if you have valve disease or a disease of the aorta, and it can be used to check the overall function of your heart muscle. You may have a coronary angiogram if:  You are having chest pain, or other symptoms of angina, and you are at risk for heart disease.  You have an abnormal electrocardiogram (ECG) or stress test.  You have chest pain and heart failure.  You are having irregular heart rhythms.  You and your health care provider determine that the benefits of the test information outweigh the risks of the procedure. Let your health care provider know about:  Any allergies you have, including allergies to contrast dye.  All medicines you are taking, including vitamins, herbs, eye drops, creams, and over-the-counter medicines.  Any problems you or family members have had with anesthetic medicines.  Any blood disorders you have.  Any surgeries you have had.  History of kidney problems or kidney failure.  Any medical conditions you have.  Whether you are pregnant or may be pregnant. What are the risks? Generally, this is a safe procedure. However, problems may occur, including:  Infection.  Allergic reaction to medicines or dyes that are used.  Bleeding from the access site or other locations.  Kidney injury, especially in people with impaired kidney function.  Stroke (rare).  Heart attack (rare).  Damage to other structures or organs. What happens before the procedure? Staying hydrated Follow instructions from your health care provider about hydration, which may include:  Up to 2 hours before the procedure - you may continue to drink clear liquids, such as water, clear fruit juice, black coffee, and plain  tea. Eating and drinking restrictions Follow instructions from your health care provider about eating and drinking, which may include:  8 hours before the procedure - stop eating heavy meals or foods such as meat, fried foods, or fatty foods.  6 hours before the procedure - stop eating light meals or foods, such as toast or cereal.  2 hours before the procedure - stop drinking clear liquids. General instructions  Ask your health care provider about: ? Changing or stopping your regular medicines. This is especially important if you are taking diabetes medicines or blood thinners. ? Taking medicines such as ibuprofen. These medicines can thin your blood. Do not take these medicines before your procedure if your health care provider instructs you not to, though aspirin may be recommended prior to coronary angiograms.  Plan to have someone take you home from the hospital or clinic.  You may need to have blood tests or X-rays done. What happens during the procedure?  An IV tube will be inserted into one of your veins.  You will be given one or more of the following: ? A medicine to help you relax (sedative). ? A medicine to  numb the area where the catheter will be inserted into an artery (local anesthetic).  To reduce your risk of infection: ? Your health care team will wash or sanitize their hands. ? Your skin will be washed with soap. ? Hair may be removed from the area where the catheter will be inserted.  You will be connected to a continuous ECG monitor.  The catheter will be inserted into an artery. The location may be in your groin, in your wrist, or in the fold of your arm (near your elbow).  A type of X-ray (fluoroscopy) will be used to help guide the catheter to the opening of the blood vessel that is being examined.  A dye will be injected into the catheter, and X-rays will be taken. The dye will help to show where any narrowing or blockages are located in the heart  arteries.  Tell your health care provider if you have any chest pain or trouble breathing during the procedure.  If blockages are found, your health care provider may perform another procedure, such as inserting a coronary stent. The procedure may vary among health care providers and hospitals. What happens after the procedure?  After the procedure, you will need to keep the area still for a few hours, or for as long as told by your health care provider. If the procedure is done through the groin, you will be instructed to not bend and not cross your legs.  The insertion site will be checked frequently.  The pulse in your foot or wrist will be checked frequently.  You may have additional blood tests, X-rays, and a test that records the electrical activity of your heart (ECG).  Do not drive for 24 hours if you were given a sedative. Summary  A coronary angiogram is an X-ray procedure that is used to look into the arteries in the heart.  During the procedure, a dye (contrast dye) is injected through a long, thin tube (catheter). The catheter is inserted through the groin, wrist, or arm.  Tell your health care provider about any allergies you have, including allergies to contrast dye.  After the procedure, you will need to keep the area still for a few hours, or for as long as told by your health care provider. This information is not intended to replace advice given to you by your health care provider. Make sure you discuss any questions you have with your health care provider. Document Released: 03/31/2003 Document Revised: 09/06/2017 Document Reviewed: 07/06/2016 Elsevier Patient Education  2020 [employer].

## 2019-08-17 NOTE — Telephone Encounter (Signed)
  Appeal letter faxed to [facility] for Ofev on 08/14/2019.  Will update when we receive a response.   Mariella Saa, PharmD, [place], [place] Clinical Specialty Pharmacist [phone]  08/17/2019 2:15 PM

## 2019-08-18 LAB — BASIC METABOLIC PANEL
BUN/Creatinine Ratio: 14 (ref 9–23)
BUN: 18 mg/dL (ref 6–24)
CO2: 23 mmol/L (ref 20–29)
Calcium: 9.8 mg/dL (ref 8.7–10.2)
Chloride: 101 mmol/L (ref 96–106)
Creatinine, Ser: 1.32 mg/dL — ABNORMAL HIGH (ref 0.57–1.00)
GFR calc Af Amer: 52 mL/min/{1.73_m2} — ABNORMAL LOW (ref >59)
GFR calc non Af Amer: 45 mL/min/{1.73_m2} — ABNORMAL LOW (ref >59)
Glucose: 177 mg/dL — ABNORMAL HIGH (ref 65–99)
Potassium: 4.4 mmol/L (ref 3.5–5.2)
Sodium: 139 mmol/L (ref 134–144)

## 2019-08-18 LAB — CBC
Hematocrit: 42.4 % (ref 34.0–46.6)
Hemoglobin: 14 g/dL (ref 11.1–15.9)
MCH: 29.5 pg (ref 26.6–33.0)
MCHC: 33 g/dL (ref 31.5–35.7)
MCV: 90 fL (ref 79–97)
Platelets: 449 10*3/uL (ref 150–450)
RBC: 4.74 x10E6/uL (ref 3.77–5.28)
RDW: 16.3 % — ABNORMAL HIGH (ref 11.7–15.4)
WBC: 15.1 10*3/uL — ABNORMAL HIGH (ref 3.4–10.8)

## 2019-08-21 NOTE — Telephone Encounter (Signed)
Called [employer] to check status of Ofev appeal. Rep John advised medication was APPROVED. Authorization dates are from 08/07/19 through 08/05/20.  Per plan patient must fill through [facility].  Auth# [id number] Phone# [phone]  2:03 PM Beatriz Chancellor, CPhT

## 2019-08-24 NOTE — Telephone Encounter (Signed)
This can be addressed by Dr. Loanne Drilling on 08/25/2019.  Patient should be scheduled for the Rituxan infusion.  This is ultimately the long-term plan for the patient.  It is not a long-term plan for her to be maintained on prednisone indefinitely.  Wyn Quaker, FNP

## 2019-08-25 ENCOUNTER — Telehealth (INDEPENDENT_AMBULATORY_CARE_PROVIDER_SITE_OTHER): Payer: BC Managed Care – PPO | Admitting: Pulmonary Disease

## 2019-08-25 ENCOUNTER — Encounter: Payer: Self-pay | Admitting: Pulmonary Disease

## 2019-08-25 DIAGNOSIS — J849 Interstitial pulmonary disease, unspecified: Secondary | ICD-10-CM

## 2019-08-25 DIAGNOSIS — J8489 Other specified interstitial pulmonary diseases: Secondary | ICD-10-CM | POA: Diagnosis not present

## 2019-08-25 DIAGNOSIS — M359 Systemic involvement of connective tissue, unspecified: Secondary | ICD-10-CM | POA: Diagnosis not present

## 2019-08-25 MED ORDER — PREDNISONE 10 MG PO TABS: ORAL_TABLET | ORAL | 0 refills | Status: DC

## 2019-08-25 NOTE — Patient Instructions (Addendum)
From 11/18-11/25 CONTINUE Prednisone 20 mg daily From 11/25 - 12/9 START to alternative Prednisone 20 mg and 10 mg every other day  From 12/9 - 12/23 START Prednisone 10 mg daily   Follow-up with me on 12/23 in-person or telephone visit.

## 2019-08-25 NOTE — Progress Notes (Signed)
Virtual Visit via Telephone Note  I connected with Carolyn Proctor on 08/25/19 at  3:00 PM EST by telephone and verified that I am speaking with the correct person using two identifiers.  Location: Patient: Home Provider: [facility]   I discussed the limitations, risks, security and privacy concerns of performing an evaluation and management service by telephone and the availability of in person appointments. I also discussed with the patient that there may be a patient responsible charge related to this service. The patient expressed understanding and agreed to proceed.   I discussed the assessment and treatment plan with the patient. The patient was provided an opportunity to ask questions and all were answered. The patient agreed with the plan and demonstrated an understanding of the instructions.   The patient was advised to call back or seek an in-person evaluation if the symptoms worsen or if the condition fails to improve as anticipated.  I provided 21 minutes of non-face-to-face time during this encounter.  Carolyn Fetting Rodman Pickle, MD  Subjective:   PATIENT ID: Carolyn Proctor GENDER: female DOB: 1961/05/14, MRN: [id number]   HPI  Chief Complaint  Patient presents with  . Follow-up    Prednisone vs rituxan   Reason for Visit: Follow-up  Ms. Stashia Pasternack is a 58 year old female never smoker with RA-ILD, history of lupus, atrial fibrillation on anticoagulation and hx of myocarditis who presents for follow-up  She is scheduled on 08/27/19 to see Rheumatology. She is also scheduled for a RHC to rule out pulmonary hypertension next month. Currently she is working at the bank and not noticing any limitations in activity. She has been compliant with her prednisone but is close to running out. Still reports swelling of her joints and mild shortness of breath with exertion. Denies chest pain, wheezing or cough.  I have personally reviewed patient's past medical/family/social  history, allergies, current medications.  Past Medical History:  Diagnosis Date  . Acute myopericarditis 02/24/2013  . Atrial fibrillation ([place])   . Atrial fibrillation with RVR ([place]) 02/22/2013  . Chronic anticoagulation, Xarelto 04/02/2013  . Chronic diastolic CHF (congestive heart failure) ([place]) 06/26/2019  . Diverticulitis   . Elevated troponin- secondary to Myopericarditis 02/25/2013  . Emphysema  07/07/2019  . Hypertension   . Lupus ([place]) 09/14/2011  . Multifocal atrial tachycardia ([place]) 02/22/2013  . PAF (paroxysmal atrial fibrillation) ([place]) 04/02/2013  . Pleuritic chest pain 02/22/2013  . 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 Rheumatoid arthritis ([place]) 07/07/2019  . 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 Right ventricular dysfunction 08/05/2019  . Thrombocytosis ([place]) 09/14/2011     Family History  Problem Relation Age of Onset  . Lupus Mother   . Liver disease Mother   . Hypertension Maternal Grandmother   . Lupus Brother      Social History   Occupational History  . Not on file  Tobacco Use  . Smoking status: Never Smoker  . Smokeless tobacco: Never Used  Substance and Sexual Activity  . Alcohol use: No  . Drug use: No  . Sexual activity: Never    No Known Allergies   Outpatient Medications Prior to Visit  Medication Sig Dispense Refill  . albuterol (VENTOLIN HFA) 108 (90 Base) MCG/ACT inhaler Inhale 2 puffs into the lungs every 6 (six) hours as needed for wheezing or shortness  of breath. 8 g 2  . Calcium Carbonate-Vit D-Min (CALCIUM 1200 PO) Take 1 capsule by mouth daily.    . cetirizine (ZYRTEC) 10 MG tablet Take 10 mg by mouth daily.    . cholecalciferol (VITAMIN D) 1000 UNITS tablet Take 3,000 Units by mouth daily.     . Cyanocobalamin (VITAMIN B-12) 1000 MCG SUBL Place 1,000 mcg under the tongue.    . furosemide  (LASIX) 20 MG tablet Take 1 tablet (20 mg total) by mouth every other day. 45 tablet 0  . pantoprazole (PROTONIX) 40 MG tablet Take 1 tablet (40 mg total) by mouth daily. 90 tablet 0  . predniSONE (DELTASONE) 20 MG tablet Take 1 tablet (20 mg total) by mouth daily with breakfast. 30 tablet 0   No facility-administered medications prior to visit.     Review of Systems  Constitutional: Negative for chills, diaphoresis, fever, malaise/fatigue and weight loss.  HENT: Negative for congestion.   Respiratory: Negative for cough, hemoptysis, sputum production, shortness of breath and wheezing.   Cardiovascular: Negative for chest pain, palpitations and leg swelling.     Objective:   There were no vitals filed for this visit.    Physical Exam: No acute distress on the phone No audible wheezing. Speaking full sentences  Data Reviewed:  Imaging: 06/23/19 CTA - No PE, mild pericardial effusion, diffuse emphysema, bibasilar pulmonary edema and effusion. Significant progressive subpleural fibrotic changes including honeycombing, diffuse involvement but predominantly worse at the lung bases 06/23/19 CT A/P - Mild pericholecystic fluid, trace simple free fluid in deep pelvis and mild body wall edema, circumferential bladder wall thickening  [facility] XR Extremities Left hand: Erosive arthropathy involving the 2nd 3rd and 4th PIP, 1st 2nd and 3rd MCP, CMC, intercarpal, and radiocarpal joints with erosions of the distal radius and ulnar styloid.  Right hand: Erosive arthropathy involving the 3rd DIP, 2nd 3rd 4th and 5th PIP, 1st through 5th MCP joints. Radiocarpal fusion/ankylosis with erosive changes involving the lateral intercarpal joints. Erosions of the ulnar styloid. Valgus deviation of the 3rd PIP joint and varus deviation of the 4th PIP joint.  Left foot: Erosive arthropathy involving the IP joint of the great toe and 2nd through 5th MTP joints. Hallux valgus.  Right foot: Erosive arthropathy  involving the IP joint of the great toe and 1st through 5th MTP joints.   IMPRESSION: FINDINGS compatible with severe rheumatoid arthritis.  PFT: None on file  Labs: [facility] 07/28/19 RNP ab >8 Smith ab >1 SSA 2.2 SSB <0.2 ANA 1:320 RA quant 72.6 CCP >250 C4 20  Imaging, labs and tests noted above have been reviewed independently by me.    Assessment & Plan:   Discussion: 58 year old female never smoker with RA-ILD with progressive bibasilar subpleural fibrotic lung disease with honeycombing and findings similar to paraseptal emphysema. She would likely benefit from anti-fibrotic agent however declines at this time. Currently being seen by Rheumatology with plans to initiate Rituxan. Will continue prednisone until Rituxan is started.  RA-ILD --RA management per Dr. Posey Pronto at [facility] in [place] for Rituxan on 08/27/19 --START Prednisone taper.  From 11/18-11/25 CONTINUE Prednisone 20 mg daily  From 11/25 - 12/9 START to alternative Prednisone 20 mg and 10 mg every other day   From 12/9 - 12/23 START Prednisone 10 mg daily --Continue enrollment for Ofev due to progressive fibrotic lung disease. I will send message to  our pharmacy team --Pulmonary function tests to be ordered prior to next visit --RA management per Dr. Posey Pronto at [facility] in [place]  Rheumatic mitral valve disease Pericardial effusion Moderately reduced systolic RV dysfunction with moderate hypokinesis Chronic diastolic heart failure --Plan for RHC  Chronic hypoxemic respiratory failure - improved --Continue supplemental oxygen for goal SpO2 >88%  Health Maintenance Immunization History  Administered Date(s) Administered  . IPV 07/26/2006  . Typhoid Live 07/26/2006  . Yellow Fever 07/26/2006   Patient declined influenza and pneumococcal until she knows the status of her Rituxan  No orders of the defined  types were placed in this encounter.  Meds ordered this encounter  Medications  . predniSONE (DELTASONE) 10 MG tablet    Sig: 11/18-11/25 20mg  Daily, 11/25-12/9-Alternate 20mg  and 10mg  every other day, 12/9-12/23-10mg  Daily    Dispense:  52 tablet    Refill:  0   Return in about 5 weeks (around 09/30/2019).  [place], MD [facility] 08/25/2019 3:37 PM  Office Number [phone]

## 2019-08-26 NOTE — Telephone Encounter (Signed)
I have discussed the medication with patient but I believe she would benefit from discussion with pharmacy team. I told her yesterday during our televisit that she should be expecting a call from the pharmacy team.

## 2019-08-28 ENCOUNTER — Telehealth: Payer: Self-pay | Admitting: Pharmacist

## 2019-08-28 NOTE — Telephone Encounter (Signed)
Patient was approved for Ofev.  Called patient to discuss starting Ofev and review dosing and common side effects.   Patient states that she had to delay her Rituxan infusion and has decided to hold off on starting Ofev at this time.  Advised that I will notify Dr. Loanne Drilling and the pharmacy team is available for any questions or concerns.   Mariella Saa, PharmD, [place], [place] [facility] Pharmacist [phone]  08/28/2019 1:49 PM

## 2019-09-07 ENCOUNTER — Other Ambulatory Visit (HOSPITAL_COMMUNITY)
Admission: RE | Admit: 2019-09-07 | Discharge: 2019-09-07 | Disposition: A | Discharge status: Home / Self Care | Payer: BC Managed Care – PPO | Source: Ambulatory Visit | Attending: Interventional Cardiology | Admitting: Interventional Cardiology

## 2019-09-07 DIAGNOSIS — Z01812 Encounter for preprocedural laboratory examination: Secondary | ICD-10-CM | POA: Insufficient documentation

## 2019-09-07 DIAGNOSIS — Z20828 Contact with and (suspected) exposure to other viral communicable diseases: Secondary | ICD-10-CM | POA: Insufficient documentation

## 2019-09-07 LAB — SARS CORONAVIRUS 2 (TAT 6-24 HRS): SARS Coronavirus 2: NEGATIVE

## 2019-09-08 ENCOUNTER — Telehealth: Payer: Self-pay | Admitting: *Deleted

## 2019-09-08 NOTE — Telephone Encounter (Signed)
-----   Message from [place], MD sent at 08/30/2019 12:52 PM EST ----- Regarding: Schedule telephone follow-up on 12/23 for pred taper Please schedule for telephone follow-up on 12/23 for prednisone taper

## 2019-09-08 NOTE — Telephone Encounter (Signed)
Pt contacted pre-catheterization scheduled at [facility] for: Wednesday September 09, 2019 8:30 AM Verified arrival time and place: [place] [facility]) at: 6:30 AM   No solid food after midnight prior to cath, clear liquids until 5 AM day of procedure. Contrast allergy: no  Hold: Lasix-day before and day of procedure-GFR 52  Except hold medications AM meds can be  taken pre-cath with sip of water including: ASA 81 mg   Confirmed patient has responsible adult to drive home post procedure and observe 24 hours after arriving home: yes  Currently, due to Covid-19 pandemic, only one support person will be allowed with patient. Must be the same support person for that patient's entire stay, will be screened and required to wear a mask. They will be asked to wait in the waiting room for the duration of the patient's stay.  Patients are required to wear a mask when they enter the hospital.     COVID-19 Pre-Screening Questions:  . In the past 7 to 10 days have you had a cough,  shortness of breath, headache, congestion, fever (100 or greater) body aches, chills, sore throat, or sudden loss of taste or sense of smell? no . Have you been around anyone with known Covid 19? no . Have you been around anyone who is awaiting Covid 19 test results in the past 7 to 10 days? no . Have you been around anyone who has been exposed to Covid 19, or has mentioned symptoms of Covid 19 within the past 7 to 10 days? no   I reviewed procedure/mask/visitor instructions with patient, she verbalized understanding, thanked me for call.

## 2019-09-09 ENCOUNTER — Encounter (HOSPITAL_COMMUNITY): Payer: Self-pay | Admitting: Interventional Cardiology

## 2019-09-09 ENCOUNTER — Ambulatory Visit (HOSPITAL_COMMUNITY)
Admission: RE | Admit: 2019-09-09 | Discharge: 2019-09-09 | Disposition: A | Discharge status: Home / Self Care | Payer: BC Managed Care – PPO | Attending: Interventional Cardiology | Admitting: Interventional Cardiology

## 2019-09-09 ENCOUNTER — Other Ambulatory Visit: Payer: Self-pay

## 2019-09-09 ENCOUNTER — Encounter (HOSPITAL_COMMUNITY)
Admission: RE | Disposition: A | Discharge status: Home / Self Care | Payer: Self-pay | Source: Home / Self Care | Attending: Interventional Cardiology

## 2019-09-09 DIAGNOSIS — J439 Emphysema, unspecified: Secondary | ICD-10-CM | POA: Diagnosis not present

## 2019-09-09 DIAGNOSIS — I272 Pulmonary hypertension, unspecified: Secondary | ICD-10-CM | POA: Diagnosis not present

## 2019-09-09 DIAGNOSIS — M069 Rheumatoid arthritis, unspecified: Secondary | ICD-10-CM | POA: Insufficient documentation

## 2019-09-09 DIAGNOSIS — I5032 Chronic diastolic (congestive) heart failure: Secondary | ICD-10-CM | POA: Insufficient documentation

## 2019-09-09 DIAGNOSIS — I480 Paroxysmal atrial fibrillation: Secondary | ICD-10-CM | POA: Insufficient documentation

## 2019-09-09 DIAGNOSIS — Z8249 Family history of ischemic heart disease and other diseases of the circulatory system: Secondary | ICD-10-CM | POA: Insufficient documentation

## 2019-09-09 DIAGNOSIS — J984 Other disorders of lung: Secondary | ICD-10-CM | POA: Diagnosis not present

## 2019-09-09 DIAGNOSIS — R0602 Shortness of breath: Secondary | ICD-10-CM | POA: Diagnosis not present

## 2019-09-09 DIAGNOSIS — Z7901 Long term (current) use of anticoagulants: Secondary | ICD-10-CM | POA: Insufficient documentation

## 2019-09-09 DIAGNOSIS — I110 Hypertensive heart disease with heart failure: Secondary | ICD-10-CM | POA: Diagnosis not present

## 2019-09-09 HISTORY — PX: RIGHT/LEFT HEART CATH AND CORONARY ANGIOGRAPHY: CATH118266

## 2019-09-09 LAB — POCT I-STAT 7, (LYTES, BLD GAS, ICA,H+H)
Acid-base deficit: 3 mmol/L — ABNORMAL HIGH (ref 0.0–2.0)
Acid-base deficit: 3 mmol/L — ABNORMAL HIGH (ref 0.0–2.0)
Bicarbonate: 20.8 mmol/L (ref 20.0–28.0)
Bicarbonate: 21.5 mmol/L (ref 20.0–28.0)
Calcium, Ion: 1.16 mmol/L (ref 1.15–1.40)
Calcium, Ion: 1.22 mmol/L (ref 1.15–1.40)
HCT: 37 % (ref 36.0–46.0)
HCT: 37 % (ref 36.0–46.0)
Hemoglobin: 12.6 g/dL (ref 12.0–15.0)
Hemoglobin: 12.6 g/dL (ref 12.0–15.0)
O2 Saturation: 86 %
O2 Saturation: 87 %
Potassium: 3.6 mmol/L (ref 3.5–5.1)
Potassium: 3.7 mmol/L (ref 3.5–5.1)
Sodium: 138 mmol/L (ref 135–145)
Sodium: 140 mmol/L (ref 135–145)
TCO2: 22 mmol/L (ref 22–32)
TCO2: 23 mmol/L (ref 22–32)
pCO2 arterial: 33.3 mmHg (ref 32.0–48.0)
pCO2 arterial: 34.8 mmHg (ref 32.0–48.0)
pH, Arterial: 7.398 (ref 7.350–7.450)
pH, Arterial: 7.404 (ref 7.350–7.450)
pO2, Arterial: 51 mmHg — ABNORMAL LOW (ref 83.0–108.0)
pO2, Arterial: 53 mmHg — ABNORMAL LOW (ref 83.0–108.0)

## 2019-09-09 LAB — POCT I-STAT EG7
Acid-base deficit: 2 mmol/L (ref 0.0–2.0)
Acid-base deficit: 2 mmol/L (ref 0.0–2.0)
Bicarbonate: 22.6 mmol/L (ref 20.0–28.0)
Bicarbonate: 22.7 mmol/L (ref 20.0–28.0)
Calcium, Ion: 1.21 mmol/L (ref 1.15–1.40)
Calcium, Ion: 1.22 mmol/L (ref 1.15–1.40)
HCT: 37 % (ref 36.0–46.0)
HCT: 37 % (ref 36.0–46.0)
Hemoglobin: 12.6 g/dL (ref 12.0–15.0)
Hemoglobin: 12.6 g/dL (ref 12.0–15.0)
O2 Saturation: 61 %
O2 Saturation: 62 %
Potassium: 3.7 mmol/L (ref 3.5–5.1)
Potassium: 3.7 mmol/L (ref 3.5–5.1)
Sodium: 139 mmol/L (ref 135–145)
Sodium: 139 mmol/L (ref 135–145)
TCO2: 24 mmol/L (ref 22–32)
TCO2: 24 mmol/L (ref 22–32)
pCO2, Ven: 37.7 mmHg — ABNORMAL LOW (ref 44.0–60.0)
pCO2, Ven: 38.2 mmHg — ABNORMAL LOW (ref 44.0–60.0)
pH, Ven: 7.38 (ref 7.250–7.430)
pH, Ven: 7.388 (ref 7.250–7.430)
pO2, Ven: 32 mmHg (ref 32.0–45.0)
pO2, Ven: 33 mmHg (ref 32.0–45.0)

## 2019-09-09 LAB — BASIC METABOLIC PANEL
Anion gap: 12 (ref 5–15)
BUN: 23 mg/dL — ABNORMAL HIGH (ref 6–20)
CO2: 22 mmol/L (ref 22–32)
Calcium: 9.3 mg/dL (ref 8.9–10.3)
Chloride: 103 mmol/L (ref 98–111)
Creatinine, Ser: 1.39 mg/dL — ABNORMAL HIGH (ref 0.44–1.00)
GFR calc Af Amer: 49 mL/min — ABNORMAL LOW (ref >60)
GFR calc non Af Amer: 42 mL/min — ABNORMAL LOW (ref >60)
Glucose, Bld: 83 mg/dL (ref 70–99)
Potassium: 4.7 mmol/L (ref 3.5–5.1)
Sodium: 137 mmol/L (ref 135–145)

## 2019-09-09 LAB — CBC
HCT: 43.7 % (ref 36.0–46.0)
Hemoglobin: 13.8 g/dL (ref 12.0–15.0)
MCH: 30.4 pg (ref 26.0–34.0)
MCHC: 31.6 g/dL (ref 30.0–36.0)
MCV: 96.3 fL (ref 80.0–100.0)
Platelets: 431 10*3/uL — ABNORMAL HIGH (ref 150–400)
RBC: 4.54 MIL/uL (ref 3.87–5.11)
RDW: 16.7 % — ABNORMAL HIGH (ref 11.5–15.5)
WBC: 8.3 10*3/uL (ref 4.0–10.5)
nRBC: 0 % (ref 0.0–0.2)

## 2019-09-09 SURGERY — RIGHT/LEFT HEART CATH AND CORONARY ANGIOGRAPHY
Anesthesia: LOCAL

## 2019-09-09 MED ORDER — FENTANYL CITRATE (PF) 100 MCG/2ML IJ SOLN
INTRAMUSCULAR | Status: AC
  Filled 2019-09-09: qty 2

## 2019-09-09 MED ORDER — HEPARIN SODIUM (PORCINE) 1000 UNIT/ML IJ SOLN
INTRAMUSCULAR | Status: AC
  Filled 2019-09-09: qty 1

## 2019-09-09 MED ORDER — LIDOCAINE HCL (PF) 1 % IJ SOLN
INTRAMUSCULAR | Status: AC
  Filled 2019-09-09: qty 30

## 2019-09-09 MED ORDER — FENTANYL CITRATE (PF) 100 MCG/2ML IJ SOLN
INTRAMUSCULAR | Status: DC | PRN
  Administered 2019-09-09: 25 ug via INTRAVENOUS

## 2019-09-09 MED ORDER — IOHEXOL 350 MG/ML SOLN
INTRAVENOUS | Status: DC | PRN
  Administered 2019-09-09: 35 mL via INTRA_ARTERIAL

## 2019-09-09 MED ORDER — HEPARIN SODIUM (PORCINE) 1000 UNIT/ML IJ SOLN
INTRAMUSCULAR | Status: DC | PRN
  Administered 2019-09-09: 3500 [IU] via INTRAVENOUS

## 2019-09-09 MED ORDER — MIDAZOLAM HCL 2 MG/2ML IJ SOLN
INTRAMUSCULAR | Status: DC | PRN
  Administered 2019-09-09 (×2): 1 mg via INTRAVENOUS

## 2019-09-09 MED ORDER — SODIUM CHLORIDE 0.9% FLUSH: 3.0000 mL | Freq: Two times a day (BID) | INTRAVENOUS | Status: DC

## 2019-09-09 MED ORDER — SODIUM CHLORIDE 0.9 % IV SOLN: 250.0000 mL | INTRAVENOUS | Status: DC | PRN

## 2019-09-09 MED ORDER — VERAPAMIL HCL 2.5 MG/ML IV SOLN
INTRAVENOUS | Status: DC | PRN
  Administered 2019-09-09: 10 mL via INTRA_ARTERIAL

## 2019-09-09 MED ORDER — MIDAZOLAM HCL 2 MG/2ML IJ SOLN
INTRAMUSCULAR | Status: AC
  Filled 2019-09-09: qty 2

## 2019-09-09 MED ORDER — SODIUM CHLORIDE 0.9% FLUSH: 3.0000 mL | INTRAVENOUS | Status: DC | PRN

## 2019-09-09 MED ORDER — SODIUM CHLORIDE 0.9 % WEIGHT BASED INFUSION
3.0000 mL/kg/h | INTRAVENOUS | Status: AC
  Administered 2019-09-09: 07:00:00 3 mL/kg/h via INTRAVENOUS

## 2019-09-09 MED ORDER — HEPARIN (PORCINE) IN NACL 1000-0.9 UT/500ML-% IV SOLN
INTRAVENOUS | Status: AC
  Filled 2019-09-09: qty 1000

## 2019-09-09 MED ORDER — LIDOCAINE HCL (PF) 1 % IJ SOLN
INTRAMUSCULAR | Status: DC | PRN
  Administered 2019-09-09 (×2): 2 mL

## 2019-09-09 MED ORDER — SODIUM CHLORIDE 0.9 % WEIGHT BASED INFUSION: 1.0000 mL/kg/h | INTRAVENOUS | Status: DC

## 2019-09-09 MED ORDER — ASPIRIN 81 MG PO CHEW: 81.0000 mg | CHEWABLE_TABLET | ORAL | Status: DC

## 2019-09-09 MED ORDER — HEPARIN (PORCINE) IN NACL 1000-0.9 UT/500ML-% IV SOLN
INTRAVENOUS | Status: DC | PRN
  Administered 2019-09-09 (×2): 500 mL

## 2019-09-09 SURGICAL SUPPLY — 12 items

## 2019-09-09 NOTE — Interval H&P Note (Signed)
Cath Lab Visit (complete for each Cath Lab visit)  Clinical Evaluation Leading to the Procedure:   ACS: No.  Non-ACS:    Anginal Classification: CCS III  Anti-ischemic medical therapy: Minimal Therapy (1 class of medications)  Non-Invasive Test Results: No non-invasive testing performed  Prior CABG: No previous CABG  Concern for pulmonary HTN.    History and Physical Interval Note:  09/09/2019 8:26 AM  [place]  has presented today for surgery, with the diagnosis of hp.  The various methods of treatment have been discussed with the patient and family. After consideration of risks, benefits and other options for treatment, the patient has consented to  Procedure(s): RIGHT/LEFT HEART CATH AND CORONARY ANGIOGRAPHY (N/A) as a surgical intervention.  The patient's history has been reviewed, patient examined, no change in status, stable for surgery.  I have reviewed the patient's chart and labs.  Questions were answered to the patient's satisfaction.     Larae Grooms

## 2019-09-09 NOTE — Telephone Encounter (Signed)
LMOVM for pt to schedule a phone visit for 12/23 with Dr. Loanne Drilling.  ta

## 2019-09-09 NOTE — Discharge Instructions (Signed)
Radial Site Care ° °This sheet gives you information about how to care for yourself after your procedure. Your health care provider may also give you more specific instructions. If you have problems or questions, contact your health care provider. °What can I expect after the procedure? °After the procedure, it is common to have: °· Bruising and tenderness at the catheter insertion area. °Follow these instructions at home: °Medicines °· Take over-the-counter and prescription medicines only as told by your health care provider. °Insertion site care °· Follow instructions from your health care provider about how to take care of your insertion site. Make sure you: °? Wash your hands with soap and water before you change your bandage (dressing). If soap and water are not available, use hand sanitizer. °? Change your dressing as told by your health care provider. °? Leave stitches (sutures), skin glue, or adhesive strips in place. These skin closures may need to stay in place for 2 weeks or longer. If adhesive strip edges start to loosen and curl up, you may trim the loose edges. Do not remove adhesive strips completely unless your health care provider tells you to do that. °· Check your insertion site every day for signs of infection. Check for: °? Redness, swelling, or pain. °? Fluid or blood. °? Pus or a bad smell. °? Warmth. °· Do not take baths, swim, or use a hot tub until your health care provider approves. °· You may shower 24-48 hours after the procedure, or as directed by your health care provider. °? Remove the dressing and gently wash the site with plain soap and water. °? Pat the area dry with a clean towel. °? Do not rub the site. That could cause bleeding. °· Do not apply powder or lotion to the site. °Activity ° °· For 24 hours after the procedure, or as directed by your health care provider: °? Do not flex or bend the affected arm. °? Do not push or pull heavy objects with the affected arm. °? Do not  drive yourself home from the hospital or clinic. You may drive 24 hours after the procedure unless your health care provider tells you not to. °? Do not operate machinery or power tools. °· Do not lift anything that is heavier than 10 lb (4.5 kg), or the limit that you are told, until your health care provider says that it is safe. °· Ask your health care provider when it is okay to: °? Return to work or school. °? Resume usual physical activities or sports. °? Resume sexual activity. °General instructions °· If the catheter site starts to bleed, raise your arm and put firm pressure on the site. If the bleeding does not stop, get help right away. This is a medical emergency. °· If you went home on the same day as your procedure, a responsible adult should be with you for the first 24 hours after you arrive home. °· Keep all follow-up visits as told by your health care provider. This is important. °Contact a health care provider if: °· You have a fever. °· You have redness, swelling, or yellow drainage around your insertion site. °Get help right away if: °· You have unusual pain at the radial site. °· The catheter insertion area swells very fast. °· The insertion area is bleeding, and the bleeding does not stop when you hold steady pressure on the area. °· Your arm or hand becomes pale, cool, tingly, or numb. °These symptoms may represent a serious problem   that is an emergency. Do not wait to see if the symptoms will go away. Get medical help right away. Call your local emergency services (911 in the [place] Do not drive yourself to the hospital. °Summary °· After the procedure, it is common to have bruising and tenderness at the site. °· Follow instructions from your health care provider about how to take care of your radial site wound. Check the wound every day for signs of infection. °· Do not lift anything that is heavier than 10 lb (4.5 kg), or the limit that you are told, until your health care provider says  that it is safe. °This information is not intended to replace advice given to you by your health care provider. Make sure you discuss any questions you have with your health care provider. °Document Released: 10/27/2010 Document Revised: 10/30/2017 Document Reviewed: 10/30/2017 °Elsevier Patient Education © 2020 [employer] ° °

## 2019-09-21 NOTE — Progress Notes (Signed)
Virtual Visit via Video Note   This visit type was conducted due to national recommendations for restrictions regarding the COVID-19 Pandemic (e.g. social distancing) in an effort to limit this patient's exposure and mitigate transmission in our community.  Due to her co-morbid illnesses, this patient is at least at moderate risk for complications without adequate follow up.  This format is felt to be most appropriate for this patient at this time.  All issues noted in this document were discussed and addressed.  A limited physical exam was performed with this format.  Please refer to the patient's chart for her consent to telehealth for [facility].    Cardiology Office Note   Date:  09/22/2019   ID:  Carolyn Proctor, Carolyn Proctor, MRN [id number]  PCP:  Lilian Coma., MD    No chief complaint on file.  Diastolic heart failure  Wt Readings from Last 3 Encounters:  09/22/19 143 lb (64.9 kg)  09/09/19 143 lb (64.9 kg)  08/17/19 141 lb 3.2 oz (64 kg)       History of Present Illness: Carolyn Proctor is a 58 y.o. female  with a hx of myopericardiis in 2014.  History as follows per Dr. 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 She appears to have recovered completely from an episode of acute myopericarditis. Her rheumatological workup does not suggest likely association with myopericarditis. Has seen Dr.Deveshwar. "  She had PAF in 2014 and was on full dose anticoagulation at that time.    Referred now for possible RHC to eval for pulm HTN.  06/2019 echo showed: "1. The left ventricle has normal systolic function with an ejection fraction of 60-65%. The cavity size was normal. There is mildly  increased left ventricular wall thickness. Left ventricular diastolic Doppler parameters are consistent with impaired  relaxation. Indeterminate filling pressures. 2. The right ventricle has moderately reduced systolic function. The cavity was moderately enlarged. There is no increase in right ventricular wall thickness. 3. Moderate hypokinesis of the basal to mid RV with relative sparing of the apex, consistent with McConnell's sign. 4. Small pericardial effusion. 5. The pericardial effusion is circumferential. 6. The mitral valve is rheumatic. Mild thickening of the mitral valve leaflet. Mild calcification of the mitral valve leaflet. There is moderate mitral annular calcification present. 7. 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 Mild prolapse of the anterior and posterior mitral valve leaflets. 8. Tricuspid valve regurgitation is moderate. 9. The aortic valve is tricuspid. No stenosis of the aortic valve. 10. The aorta is normal unless otherwise noted."  Hospitalized for volume overload, diastolic heart failure.  Now off of oxygen.  Also noted to have worsening ILD.  Anticoagulation was stopped due to a bleeding problem with her menstrual cycle.   Cath in 09/09/2019 showed:  The left ventricular systolic function is normal.  LV end diastolic pressure is low.  The left ventricular ejection fraction is 55-65% by visual estimate.  There is no aortic valve stenosis.  Hemodynamic findings consistent with moderate pulmonary hypertension.  Ao sat 87%, PA sat 62%, mean PA 35 mm Hg, mean PCWP 12 mm Hg; CO 5.1 L/min; CI 2.8  No angiographically apparent coronary artery disease.   No significant  CAD.  Continue medical therapy for lung disease.  F/u with Dr. Loanne Drilling for management of pulmonary HTN.  No problems with the radial site.    SHe will see Dr. Loanne Drilling on 12/23.  Prednisone to be decreased.     Past  Medical History:  Diagnosis Date  . Acute myopericarditis 02/24/2013  . Atrial fibrillation ([place])   . Atrial fibrillation with RVR ([place]) 02/22/2013  . Chronic anticoagulation, Xarelto 04/02/2013  . Chronic diastolic CHF (congestive heart failure) ([place]) 06/26/2019  . Diverticulitis   . Elevated troponin- secondary to Myopericarditis 02/25/2013  . Emphysema  07/07/2019  . Hypertension   . Lupus ([place]) 09/14/2011  . 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 PAF (paroxysmal atrial fibrillation) ([place]) 04/02/2013  . Pleuritic chest pain 02/22/2013  . 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 Rheumatoid arthritis ([place]) 07/07/2019  . 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 Right ventricular dysfunction 08/05/2019  . Thrombocytosis ([place]) 09/14/2011    Past Surgical History:  Procedure Laterality Date  . BONE MARROW BIOPSY  2010  . CARDIAC CATHETERIZATION    . LEFT HEART CATHETERIZATION WITH CORONARY ANGIOGRAM N/A 02/24/2013   Procedure: LEFT HEART CATHETERIZATION WITH CORONARY ANGIOGRAM;  Surgeon: Leonie Man, MD;  Location[facility] C[facility] LAB;  Service: Cardiovascular;  Laterality: N/A;  . RIGHT/LEFT HEART CATH AND CORONARY ANGIOGRAPHY N/A 09/09/2019   Procedure: RIGHT/LEFT HEART CATH AND CORONARY ANGIOGRAPHY;  Surgeon: Jettie Booze, MD;  Location: [facility] CV LAB;  Service: Cardiovascular;  Laterality: N/A;     Current Outpatient Medications  Medication Sig Dispense Refill  . albuterol (VENTOLIN HFA) 108 (90 Base) MCG/ACT inhaler Inhale 2 puffs into the lungs every 6 (six) hours as needed for wheezing or shortness of breath. 8 g 2  . Calcium Carbonate-Vit D-Min (CALCIUM 1200 PO) Take 1 capsule by mouth daily.    . Cholecalciferol (VITAMIN D) 50 MCG (2000 UT) tablet Take 2,000 Units by mouth daily.     . Cyanocobalamin (VITAMIN B-12) 1000 MCG SUBL  Place 1,000 mcg under the tongue.    . furosemide (LASIX) 20 MG tablet Take 1 tablet (20 mg total) by mouth every other day. 45 tablet 0  . pantoprazole (PROTONIX) 40 MG tablet Take 1 tablet (40 mg total) by mouth daily. 90 tablet 0  . predniSONE (DELTASONE) 20 MG tablet Take 1 tablet (20 mg total) by mouth daily with breakfast. 30 tablet 0   No current facility-administered medications for this visit.    Allergies:   Patient has no known allergies.    Social History:  The patient  reports that she has never smoked. She has never used smokeless tobacco. She reports that she does not drink alcohol or use drugs.   Family History:  The patient's family history includes Hypertension in her maternal grandmother; Liver disease in her mother; Lupus in her brother and mother.    ROS:  Please see the history of present illness.   Otherwise, review of systems are positive for DOE.   All other systems are reviewed and negative.    PHYSICAL EXAM: VS:  Ht _0  (1.803 m)   Wt 143 lb (64.9 kg)   LMP 02/19/2013   BMI 19.94 kg/m  , BMI Body mass index is 19.94 kg/m. GEN: Well nourished, well developed, in no acute distress ; raspy  voice Cardiac: ,no edema  Respiratory:  normal work of breathing Skin: no rash Neuro:  Strength appears intact Psych: euthymic mood, full affect    Recent Labs: 06/23/2019: ALT 18; B Natriuretic Peptide 1,500.6 09/09/2019: BUN 23; Creatinine, Ser 1.39; Hemoglobin 12.6; Hemoglobin 12.6; Platelets 431; Potassium 3.7; Potassium 3.7; Sodium 139; Sodium 139   Lipid Panel    Component Value Date/Time   CHOL  07/18/2007 0305    137        ATP III CLASSIFICATION:  <200     mg/dL   Desirable  200-239  mg/dL   Borderline High  >=240    mg/dL   High   TRIG 48 07/18/2007 0305   HDL 39 (L) 07/18/2007 0305   CHOLHDL 3.5 07/18/2007 0305   VLDL 10 07/18/2007 0305   LDLCALC  07/18/2007 0305    88        Total Cholesterol/HDL:CHD Risk Coronary Heart Disease Risk Table                      Men   Women  1/2 Average Risk   3.4   3.3     Other studies Reviewed: Additional studies/ records that were reviewed today with results demonstrating: cath results .   ASSESSMENT AND PLAN:  1. PAF: Noted in the past.  No sx of arrhythmia at this time.   2. Chronic diastolic heart failure: Breathing well.  OK to take an extra Lasix 1-2 x/week if she feels volume overload.  3. 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  4. No CAD.  Pulm HTN related to ILD.  Managed by Dr. Loanne Drilling.  5. No problems post cath with radial site or brachial site.   Current medicines are reviewed at length with the patient today.  The patient concerns regarding her medicines were addressed.  The following changes have been made:  No change  Labs/ tests ordered today include: normal renal function No orders of the defined types were placed in this encounter.   Recommend 150 minutes/week of aerobic exercise Low fat, low carb, high fiber diet recommended  Disposition:   FU in 1 year  15 minutes spent with the patient   Signed, Larae Grooms, MD  09/22/2019 2:39 PM    [address], [place], [place]  [address] Phone: [phone]; Fax: [phone]

## 2019-09-22 ENCOUNTER — Other Ambulatory Visit: Payer: Self-pay

## 2019-09-22 ENCOUNTER — Telehealth (INDEPENDENT_AMBULATORY_CARE_PROVIDER_SITE_OTHER): Payer: BC Managed Care – PPO | Admitting: Interventional Cardiology

## 2019-09-22 ENCOUNTER — Encounter: Payer: Self-pay | Admitting: Interventional Cardiology

## 2019-09-22 VITALS — Ht 71.0 in | Wt 143.0 lb

## 2019-09-22 DIAGNOSIS — I059 Rheumatic mitral valve disease, unspecified: Secondary | ICD-10-CM

## 2019-09-22 DIAGNOSIS — I5032 Chronic diastolic (congestive) heart failure: Secondary | ICD-10-CM | POA: Diagnosis not present

## 2019-09-22 DIAGNOSIS — I480 Paroxysmal atrial fibrillation: Secondary | ICD-10-CM | POA: Diagnosis not present

## 2019-09-22 DIAGNOSIS — I272 Pulmonary hypertension, unspecified: Secondary | ICD-10-CM

## 2019-09-22 MED ORDER — FUROSEMIDE 20 MG PO TABS: 20.0000 mg | ORAL_TABLET | ORAL | 3 refills | Status: DC

## 2019-09-22 NOTE — Patient Instructions (Signed)
Medication Instructions:  Your physician recommends that you continue on your current medications as directed. Please refer to the Current Medication list given to you today.  A refill for your furosemide (lasix) 20 mg: take 3 times per week has been sent in  *If you need a refill on your cardiac medications before your next appointment, please call your pharmacy*  Lab Work: None ordered  If you have labs (blood work) drawn today and your tests are completely normal, you will receive your results only by: Marland Kitchen MyChart Message (if you have MyChart) OR . A paper copy in the mail If you have any lab test that is abnormal or we need to change your treatment, we will call you to review the results.  Testing/Procedures: None ordered  Follow-Up: At [facility], you and your health needs are our priority.  As part of our continuing mission to provide you with exceptional heart care, we have created designated Provider Care Teams.  These Care Teams include your primary Cardiologist (physician) and Advanced Practice Providers (APPs -  Physician Assistants and Nurse Practitioners) who all work together to provide you with the care you need, when you need it.  Your next appointment:   12 month(s)  The format for your next appointment:   In Person  Provider:   You may see Larae Grooms, MD or one of the following Advanced Practice Providers on your designated Care Team:    Melina Copa, PA-C  Ermalinda Barrios, PA-C   Other Instructions

## 2019-09-28 ENCOUNTER — Other Ambulatory Visit: Payer: Self-pay

## 2019-09-28 ENCOUNTER — Encounter: Payer: Self-pay | Admitting: Pulmonary Disease

## 2019-09-28 ENCOUNTER — Ambulatory Visit (INDEPENDENT_AMBULATORY_CARE_PROVIDER_SITE_OTHER): Payer: BC Managed Care – PPO | Admitting: Pulmonary Disease

## 2019-09-28 DIAGNOSIS — J9611 Chronic respiratory failure with hypoxia: Secondary | ICD-10-CM

## 2019-09-28 DIAGNOSIS — I313 Pericardial effusion (noninflammatory): Secondary | ICD-10-CM

## 2019-09-28 DIAGNOSIS — M359 Systemic involvement of connective tissue, unspecified: Secondary | ICD-10-CM

## 2019-09-28 DIAGNOSIS — J849 Interstitial pulmonary disease, unspecified: Secondary | ICD-10-CM

## 2019-09-28 DIAGNOSIS — M069 Rheumatoid arthritis, unspecified: Secondary | ICD-10-CM

## 2019-09-28 DIAGNOSIS — I272 Pulmonary hypertension, unspecified: Secondary | ICD-10-CM | POA: Insufficient documentation

## 2019-09-28 DIAGNOSIS — J8489 Other specified interstitial pulmonary diseases: Secondary | ICD-10-CM

## 2019-09-28 DIAGNOSIS — I059 Rheumatic mitral valve disease, unspecified: Secondary | ICD-10-CM

## 2019-09-28 DIAGNOSIS — I509 Heart failure, unspecified: Secondary | ICD-10-CM

## 2019-09-28 MED ORDER — PREDNISONE 10 MG PO TABS: ORAL_TABLET | ORAL | 0 refills | Status: DC

## 2019-09-28 MED ORDER — PANTOPRAZOLE SODIUM 40 MG PO TBEC: 40.0000 mg | DELAYED_RELEASE_TABLET | Freq: Every day | ORAL | 0 refills | Status: DC

## 2019-09-28 NOTE — Patient Instructions (Signed)
RA-ILD --RA management per Dr. Posey Pronto at [place] and Arthritis in [place]            S/p [employer] 12/9. Next dose scheduled 12/23. --START Prednisone taper.  From 12/21-1/4 START to alternative Prednisone 20 mg and 10 mg every other day  From 1/4 - 1/18 START Prednisone 10 mg daily --Scheduled for Pulmonary function tests on 10/12/18 --On your next visit, we will also schedule 6 minute walk test

## 2019-09-28 NOTE — Progress Notes (Signed)
Virtual Visit via Telephone Note  I connected with Carolyn Proctor on 09/28/19 at  9:15 AM EST by telephone and verified that I am speaking with the correct person using two identifiers.  Location: Patient: Home Provider: [facility]   I discussed the limitations, risks, security and privacy concerns of performing an evaluation and management service by telephone and the availability of in person appointments. I also discussed with the patient that there may be a patient responsible charge related to this service. The patient expressed understanding and agreed to proceed.   I discussed the assessment and treatment plan with the patient. The patient was provided an opportunity to ask questions and all were answered. The patient agreed with the plan and demonstrated an understanding of the instructions.   The patient was advised to call back or seek an in-person evaluation if the symptoms worsen or if the condition fails to improve as anticipated.  I provided 21 minutes of non-face-to-face time during this encounter.   Rook Maue Rodman Pickle, MD   Subjective:   PATIENT ID: Carolyn Proctor GENDER: female DOB: 1961/02/01, MRN: [id number]   HPI  Chief Complaint  Patient presents with  . televisit    visit to decrease prednisone.  currently taking 20mg  daily since 06/2019.     Reason for Visit: Follow-up  [name] is a 58 year old female never smoker with RA-IL  58 year old female never smoker with RA-ILD, history of lupus, atrial fibrillation on anticoagulation and hx of myocarditis who presents for follow-up.   Since our last visit she underwent RHC which demonstrated moderate pulmonary hypertension. She has been asymptomatic. Denies shortness of breath, chest pain, cough or wheezing. Denies lower extremity edema, palpitations. For her RA, she received her Rituxan infusion on 09/16/19 with next planned infusion 09/30/19. Denies any adverse reactions. Has not had to wear  oxygen since she restarted work and denies any issues  I have personally reviewed patient's past medical/family/social history/allergies/current medications.  Past Medical History:  Diagnosis Date  . Acute myopericarditis 02/24/2013  . Atrial fibrillation ([place])   . Atrial fibrillation with RVR ([place]) 02/22/2013  . Chronic anticoagulation, Xarelto 04/02/2013  . Chronic diastolic CHF (congestive heart failure) ([place]) 06/26/2019  . Diverticulitis   . Elevated troponin- secondary to Myopericarditis 02/25/2013  . Emphysema  07/07/2019  . Hypertension   . Lupus ([place]) 09/14/2011  . 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 PAF (paroxysmal atrial fibrillation) ([place]) 04/02/2013  . Pleuritic chest pain 02/22/2013  . 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 Rheumatoid arthritis ([place] 07/07/2019  . 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 Right ventricular dysfunction 08/05/2019  . Thrombocytosis ([place]) 09/14/2011     Family History  Problem Relation Age of Onset  . Lupus Mother   . Liver disease Mother   . Hypertension Maternal Grandmother   . Lupus Brother      Social History   Occupational History  . Not on file  Tobacco Use  . Smoking status: Never Smoker  . Smokeless tobacco: Never Used  Substance and Sexual Activity  . Alcohol use: No  . Drug use: No  . Sexual activity: Never    No Known Allergies   Outpatient Medications Prior to Visit  Medication Sig Dispense Refill  . albuterol (VENTOLIN HFA) 108 (90 Base) MCG/ACT inhaler Inhale  2 puffs into the lungs every 6 (six) hours as needed for wheezing or shortness of breath. 8 g 2  . Calcium Carbonate-Vit D-Min (CALCIUM 1200 PO) Take 1 capsule by mouth daily.    . Cholecalciferol (VITAMIN D) 50 MCG (2000 UT) tablet Take 2,000 Units by mouth daily.     . Cyanocobalamin (VITAMIN B-12) 1000 MCG  SUBL Place 1,000 mcg under the tongue.    . furosemide (LASIX) 20 MG tablet Take 1 tablet (20 mg total) by mouth 3 (three) times a week. 36 tablet 3  . pantoprazole (PROTONIX) 40 MG tablet Take 1 tablet (40 mg total) by mouth daily. 90 tablet 0  . predniSONE (DELTASONE) 20 MG tablet Take 1 tablet (20 mg total) by mouth daily with breakfast. 30 tablet 0   No facility-administered medications prior to visit.    Review of Systems  Constitutional: Negative for chills, diaphoresis, fever, malaise/fatigue and weight loss.  HENT: Negative for congestion.   Respiratory: Negative for cough, hemoptysis, sputum production, shortness of breath and wheezing.   Cardiovascular: Negative for chest pain, palpitations and leg swelling.     Objective:   There were no vitals filed for this visit.    Physical Exam: No acute distress on the phone. Able to speak full sentences.  Data Reviewed:  Imaging: 06/23/19 CTA - No PE, mild pericardial effusion, diffuse emphysema, bibasilar pulmonary edema and effusion. Significant progressive subpleural fibrotic changes including honeycombing, diffuse involvement but predominantly worse at the lung bases 06/23/19 CT A/P - Mild pericholecystic fluid, trace simple free fluid in deep pelvis and mild body wall edema, circumferential bladder wall thickening  [facility] XR Extremities Left hand: Erosive arthropathy involving the 2nd 3rd and 4th PIP, 1st 2nd and 3rd MCP, CMC, intercarpal, and radiocarpal joints with erosions of the distal radius and ulnar styloid.  Right hand: Erosive arthropathy involving the 3rd DIP, 2nd 3rd 4th and 5th PIP, 1st through 5th MCP joints. Radiocarpal fusion/ankylosis with erosive changes involving the lateral intercarpal joints. Erosions of the ulnar styloid. Valgus deviation of the 3rd PIP joint and varus deviation of the 4th PIP joint.  Left foot: Erosive arthropathy involving the IP joint of the great toe and 2nd through 5th MTP joints.  Hallux valgus.  Right foot: Erosive arthropathy involving the IP joint of the great toe and 1st through 5th MTP joints.   IMPRESSION: FINDINGS compatible with severe rheumatoid arthritis.  PFT: None on file  Labs: [facility] Labs 07/28/19 RNP ab >8 Smith ab >1 SSA 2.2 SSB <0.2 ANA 1:320 RA quant 72.6 CCP >250 C4 20  Imaging, labs and test noted above have been reviewed independently by me.    Assessment & Plan:   Discussion: 58 year old female never smoker with RA-ILD with progressive bibasilar subpleural fibrotic lung disease with honeycombing as well as paraseptal emphysema. Previously approved for anti-fibrotic however she declined at this time. Currently receiving Rituxan with Rheumatology. Started on prednisone taper today on 12/21 with plan to wean off.  RA-ILD --RA management per Dr. Posey Pronto at [place] [facility] and Arthritis in [place]            S/p [place] 12/9. Next dose scheduled 12/23. --START Prednisone taper.  From 12/21-1/4 START to alternative Prednisone 20 mg and 10 mg every other day  From 1/4 - 1/18 START Prednisone 10 mg daily --Scheduled for Pulmonary function tests on 10/12/18 --Plan for 6MWT after PFTs --Continue PPI daily --Declined Ofev at this time  Rheumatic mitral valve disease --  Plan as above  Chronic hypoxemic respiratory failure - improved --Continue supplemental oxygen for goal SpO2 >88% --Plan for 6MWT after PFTs  Moderate Pulmonary Hypertension, probable WHO Class 1, 3 Pericardial effusion Moderately reduced systolic RV dysfunction with moderate hypokinesis Chronic diastolic heart failure --Asymptomatic. No indication for advanced therapies at this time  Health Maintenance Immunization History  Administered Date(s) Administered  . IPV 07/26/2006  . Typhoid Live 07/26/2006  . Yellow Fever 07/26/2006   Patient declined influenza and pneumococcal until she knows the status of her Rituxan  No orders of the defined types  were placed in this encounter.  Meds ordered this encounter  Medications  . pantoprazole (PROTONIX) 40 MG tablet    Sig: Take 1 tablet (40 mg total) by mouth daily.    Dispense:  90 tablet    Refill:  0  . predniSONE (DELTASONE) 10 MG tablet    Sig: 20mg  daily X2 weeks, then 10mg  daily    Dispense:  56 tablet    Refill:  0   Return in 7 weeks (on 11/16/2019).  [place], MD [facility] 09/28/2019 9:06 AM  Office Number [phone]

## 2019-09-29 NOTE — Telephone Encounter (Signed)
I called and spoke with the patient to get clarification. She states that she was referring to the Prednisone. I advised her that it was sent for her to take 2 10mg  tabs for 2 weeks and then 10mg  there after. She states that the pill has a groove in the middle and its 10 on one side and 5 on the other. I advised her that this was only the markings on the pill and that it was truly a 10mg  tablet and not a 15mg . She verbalized understanding and nothing further is needed.

## 2019-09-30 ENCOUNTER — Ambulatory Visit: Payer: BC Managed Care – PPO | Admitting: Pulmonary Disease

## 2019-10-10 ENCOUNTER — Other Ambulatory Visit (HOSPITAL_COMMUNITY)
Admission: RE | Admit: 2019-10-10 | Discharge: 2019-10-10 | Disposition: A | Discharge status: Home / Self Care | Payer: BC Managed Care – PPO | Source: Ambulatory Visit | Attending: Pulmonary Disease | Admitting: Pulmonary Disease

## 2019-10-10 DIAGNOSIS — Z01812 Encounter for preprocedural laboratory examination: Secondary | ICD-10-CM | POA: Diagnosis not present

## 2019-10-10 DIAGNOSIS — Z20822 Contact with and (suspected) exposure to covid-19: Secondary | ICD-10-CM | POA: Insufficient documentation

## 2019-10-10 LAB — SARS CORONAVIRUS 2 (TAT 6-24 HRS): SARS Coronavirus 2: NEGATIVE

## 2019-10-12 NOTE — Progress Notes (Signed)
@Patient  ID: Carolyn Proctor, female    DOB: Jan 17, 1961, 59 y.o.   MRN: [id number]  Chief Complaint  Patient presents with  . Follow-up    F/U after PFT. States her breathing has been improving since last visit.     Referring provider: Lilian Coma., MD  HPI:  59 year old female never smoker consulted with our practice on 06/24/2019 during the hospitalization of an RA ILD flare.  Patient was previously seen by our office over 5 years ago by Dr. Chase Caller.  PMH: Rheumatoid arthritis, RA-Interstitial lung disease, Atrial fibrillation on anticoagulation, Hx myocarditis, rheumatic mitral valve disease Smoker/ Smoking History: Never smoker Maintenance: 10 mg of prednisone daily, Rituxan Pt of: Dr. Loanne Drilling  10/13/2019  - Visit   59 year old female never smoker followed in our office for RA ILD.  She was last seen by Dr. Loanne Drilling on 09/28/2019.  She is established with [facility] rheumatology.  She is status post Rituxan infusions.  She did have to take Benadryl after her last Rituxan infusion.  She reports her next scheduled Rituxan infusion is in June/2021.  She has started her prednisone taper and has recently transitioned down to a 10 mg of prednisone daily.  She reports that Dr. Loanne Drilling wanted her to be maintained on this for the next 2 weeks.  At last office visit she declined starting antifibrotic's.  She is scheduled today to complete pulmonary function testing.  Those results are listed below:  10/13/2019-pulmonary function test-FVC 2.07 (59% predicted), postbronchodilator ratio 85, postbronchodilator FEV1 1.66 (60% predicted), no bronchodilator response, DLCO 6.70 (26% predicted  Patient was walked at last office visit and did not require oxygen.  Patient also states needs to be scheduled for a 6-minute walk.  Patient reports she has been weighing herself at home daily to monitor for fluid overload.  She reports that her weights ranged between 142 pounds 143 pounds.  Overall patient feels  that she has been doing much better.  She currently feels that she is asymptomatic.  Tests:   10/13/2019-pulmonary function test-FVC 2.07 (59% predicted), postbronchodilator ratio 85, postbronchodilator FEV1 1.66 (60% predicted), no bronchodilator response, DLCO 6.70 (26% predicted)  06/23/19 CTA - No PE, mild pericardial effusion, diffuse emphysema, bibasilar pulmonary edema and effusion. Significant progressive subpleural fibrotic changes including honeycombing, diffuse involvement but predominantly worse at the lung bases  06/23/19 CT A/P - Mild pericholecystic fluid, trace simple free fluid in deep pelvis and mild body wall edema, circumferential bladder wall thickening  06/24/2019-echocardiogram-LV ejection fraction 60 to 65%, mildly increased left ventricular wall thickness, right ventricle is moderately reduced systolic function, cavity moderately enlarged, no increase in right ventricular wall thickness, small pericardial effusion, hockey-stick-like motion of the anterior mitral valve leaflet 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  06/11/2014-pulmonary function test- FVC 2.59 (72% predicted), postbronchodilator ratio 80, postbronchodilator FEV1 2.18 (75% predicted), positive bronchodilator response in FEV1, mid flow reversibility, DLCO 13.7 (42% predicted)  FENO:  No results found for: NITRICOXIDE  PFT: PFT Results Latest Ref Rng & Units 10/13/2019 06/11/2014  FVC-Pre L 2.07 2.59  FVC-Predicted Pre % 59 72  FVC-Post L 1.94 2.74  FVC-Predicted Post % 56 76  Pre FEV1/FVC % % 80 73  Post FEV1/FCV % % 85 80  FEV1-Pre L 1.65 1.89  FEV1-Predicted Pre % 60 65  FEV1-Post L 1.66 2.18  DLCO UNC% % 26 42  DLCO COR %Predicted % 51 71  TLC L 3.43 3.89  TLC % Predicted % 57 65  RV % Predicted %  64 57    WALK:  SIX MIN WALK 10/13/2019 07/07/2019 06/01/2014  Supplimental Oxygen during Test? (L/min) No No No  Tech Comments: Patient was able to complete one lap at a steady pace. Denied any SOB  or chest pain during walk. No O2 was needed during or after walk. Patient was able to complete 2 laps at a steady pace. Denied any SOB, chest pain or leg pain. She was able to walk and carry a conversation without stopping or gasping for air. No O2 was needed during or after walk. -    Imaging: No results found.  Lab Results:  CBC    Component Value Date/Time   WBC 8.3 09/09/2019 0640   RBC 4.54 09/09/2019 0640   HGB 12.6 09/09/2019 0906   HGB 12.6 09/09/2019 0906   HGB 14.0 08/17/2019 1520   HGB 13.8 04/02/2013 0901   HCT 37.0 09/09/2019 0906   HCT 37.0 09/09/2019 0906   HCT 42.4 08/17/2019 1520   HCT 40.9 04/02/2013 0901   PLT 431 (H) 09/09/2019 0640   PLT 449 08/17/2019 1520   MCV 96.3 09/09/2019 0640   MCV 90 08/17/2019 1520   MCV 99 04/02/2013 0901   MCH 30.4 09/09/2019 0640   MCHC 31.6 09/09/2019 0640   RDW 16.7 (H) 09/09/2019 0640   RDW 16.3 (H) 08/17/2019 1520   RDW 12.7 04/02/2013 0901   LYMPHSABS 2.0 04/02/2013 0901   MONOABS 0.6 01/16/2013 1806   EOSABS 0.5 04/02/2013 0901   BASOSABS 0.1 04/02/2013 0901    BMET    Component Value Date/Time   NA 139 09/09/2019 0906   NA 139 09/09/2019 0906   NA 139 08/17/2019 1520   K 3.7 09/09/2019 0906   K 3.7 09/09/2019 0906   CL 103 09/09/2019 0640   CO2 22 09/09/2019 0640   GLUCOSE 83 09/09/2019 0640   BUN 23 (H) 09/09/2019 0640   BUN 18 08/17/2019 1520   CREATININE 1.39 (H) 09/09/2019 0640   CREATININE 0.75 03/31/2013 1536   CALCIUM 9.3 09/09/2019 0640   GFRNONAA 42 (L) 09/09/2019 0640   GFRAA 49 (L) 09/09/2019 0640    BNP    Component Value Date/Time   BNP 1,500.6 (H) 06/23/2019 1800    ProBNP No results found for: PROBNP  Specialty Problems      Pulmonary Problems   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 IPV 07/26/2006  . Typhoid Live 07/26/2006  . Yellow Fever 07/26/2006    Past Medical History:  Diagnosis Date  . Acute myopericarditis 02/24/2013  . Atrial fibrillation ([place])   . Atrial fibrillation with RVR ([place]) 02/22/2013  . Chronic anticoagulation, Xarelto 04/02/2013  . Chronic diastolic CHF (congestive heart failure) ([place]) 06/26/2019  . Diverticulitis   . Elevated troponin- secondary to Myopericarditis 02/25/2013  . Emphysema  07/07/2019  . Hypertension   . Lupus ([place]) 09/14/2011  . 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 PAF (paroxysmal atrial fibrillation) ([place]) 04/02/2013  . Pleuritic chest pain 02/22/2013  . 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 Rheumatoid arthritis ([place]) 07/07/2019  . Rheumatoid lung disease ([place]) 06/23/2019  . Right ventricular dysfunction 08/05/2019  . Thrombocytosis ([place]) 09/14/2011    Tobacco History: Social History   Tobacco Use  Smoking Status Never Smoker  Smokeless Tobacco Never Used   Counseling given: Yes   Continue to not smoke  Outpatient Encounter Medications as of 10/13/2019  Medication Sig  . albuterol (VENTOLIN HFA) 108 (90 Base) MCG/ACT inhaler Inhale 2 puffs into the lungs every 6 (six) hours as needed for wheezing or shortness of breath.  . Calcium Carbonate-Vit D-Min (CALCIUM 1200 PO) Take 1 capsule by mouth daily.  . Cholecalciferol (VITAMIN D) 50 MCG (2000 UT) tablet Take 2,000 Units by mouth daily.   . Cyanocobalamin (VITAMIN B-12) 1000 MCG SUBL Place 1,000 mcg under the tongue.  . furosemide (LASIX) 20 MG tablet Take 1 tablet (20 mg total) by mouth 3 (three) times a week.  . pantoprazole (PROTONIX) 40 MG tablet Take 1 tablet (40 mg total) by mouth daily.  .  predniSONE (DELTASONE) 10 MG tablet 20mg  daily X2 weeks, then 10mg  daily (Patient taking differently: Take 10 mg by mouth daily with breakfast. 20mg  daily X2 weeks, then 10mg  daily)   No facility-administered encounter medications on file as of 10/13/2019.     Review of Systems  Review of Systems  Constitutional: Negative for activity change, fatigue and fever.  HENT: Negative for sinus pressure, sinus pain and sore throat.   Respiratory: Negative for cough, shortness of breath and wheezing.   Cardiovascular: Negative for chest pain and palpitations.  Gastrointestinal: Negative for nausea and vomiting.  Musculoskeletal: Negative for arthralgias.  Neurological: Negative for dizziness.  Psychiatric/Behavioral: Negative for sleep disturbance. The patient is not nervous/anxious.      Physical Exam  BP 110/68 (BP Location: Left Arm, Patient Position: Sitting, Cuff Size: Normal)   Pulse 62   Temp 98.1 F (36.7 C) (Temporal)   Ht 5\' 11"  (1.803 m)   Wt 143 lb 12.8 oz (65.2 kg)   LMP 02/19/2013   SpO2 98%   BMI 20.06 kg/m   Wt Readings from Last 5 Encounters:  10/13/19 143 lb 12.8 oz (65.2 kg)  09/22/19 143 lb (64.9 kg)  09/09/19 143 lb (64.9 kg)  08/17/19 141 lb 3.2 oz (64 kg)  08/04/19 139 lb 9.6 oz (63.3 kg)    BMI Readings from Last 5 Encounters:  10/13/19 20.06 kg/m  09/22/19 19.94 kg/m  09/09/19 19.94 kg/m  08/17/19 19.69 kg/m  08/04/19 19.47 kg/m     Physical Exam Vitals and nursing note reviewed.  Constitutional:      General: She is not in acute distress.    Appearance: Normal appearance. She is normal weight.  HENT:     Head: Normocephalic and atraumatic.     Right Ear: Tympanic membrane, ear canal and external ear normal. There is no impacted cerumen.     Left Ear: Tympanic membrane, ear canal and external ear normal. There is no impacted cerumen.     Nose: Nose normal. No congestion or rhinorrhea.     Mouth/Throat:     Mouth: Mucous membranes are  moist.     Pharynx: Oropharynx is clear.  Eyes:     Pupils: Pupils are equal, round, and reactive to light.  Cardiovascular:     Rate and Rhythm: Normal rate and regular rhythm.     Pulses: Normal pulses.  Heart sounds: Normal heart sounds. No murmur.  Pulmonary:     Effort: No respiratory distress.     Breath sounds: No decreased air movement. Rales (  Basilar crackles, right greater than left) present. No decreased breath sounds or wheezing.  Musculoskeletal:     Cervical back: Normal range of motion.     Right lower leg: No edema.     Left lower leg: No edema.  Skin:    General: Skin is warm and dry.     Capillary Refill: Capillary refill takes less than 2 seconds.  Neurological:     General: No focal deficit present.     Mental Status: She is alert and oriented to person, place, and time. Mental status is at baseline.     Gait: Gait (Tolerated walk in office well) normal.  Psychiatric:        Mood and Affect: Mood normal.        Behavior: Behavior normal.        Thought Content: Thought content normal.        Judgment: Judgment normal.       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 Loanne Drilling later on this month with 6-minute walk Consider repeating pulmonary function testing in 6  to 12 months Can consider repeating CT imaging in 6 to 12 months based off patient's clinical symptoms Offered antifibrotic's, patient will consider  Rheumatoid arthritis ([place]) Plan: Continue Rituxan infusions Continue to follow-up with [facility] rheumatology Continue 10 mg of prednisone for the next 2 weeks    Return in about 15 days (around 10/28/2019) for Follow up with Dr. Loanne Drilling.   Lauraine Rinne, NP 10/13/2019   This appointment required 42 minutes of patient care (this includes precharting, chart review, review of results, face-to-face care, etc.).

## 2019-10-13 ENCOUNTER — Encounter: Payer: Self-pay | Admitting: Pulmonary Disease

## 2019-10-13 ENCOUNTER — Ambulatory Visit (INDEPENDENT_AMBULATORY_CARE_PROVIDER_SITE_OTHER): Payer: BC Managed Care – PPO | Admitting: Pulmonary Disease

## 2019-10-13 ENCOUNTER — Other Ambulatory Visit: Payer: Self-pay

## 2019-10-13 VITALS — BP 110/68 | HR 62 | Temp 98.1°F | Ht 71.0 in | Wt 143.8 lb

## 2019-10-13 DIAGNOSIS — M069 Rheumatoid arthritis, unspecified: Secondary | ICD-10-CM

## 2019-10-13 DIAGNOSIS — I272 Pulmonary hypertension, unspecified: Secondary | ICD-10-CM | POA: Diagnosis not present

## 2019-10-13 DIAGNOSIS — J432 Centrilobular emphysema: Secondary | ICD-10-CM

## 2019-10-13 DIAGNOSIS — J849 Interstitial pulmonary disease, unspecified: Secondary | ICD-10-CM

## 2019-10-13 DIAGNOSIS — J8489 Other specified interstitial pulmonary diseases: Secondary | ICD-10-CM | POA: Diagnosis not present

## 2019-10-13 DIAGNOSIS — J8410 Pulmonary fibrosis, unspecified: Secondary | ICD-10-CM

## 2019-10-13 DIAGNOSIS — M359 Systemic involvement of connective tissue, unspecified: Secondary | ICD-10-CM

## 2019-10-13 LAB — PULMONARY FUNCTION TEST
DL/VA % pred: 51 %
DL/VA: 2.08 ml/min/mmHg/L
DLCO unc % pred: 26 %
DLCO unc: 6.7 ml/min/mmHg
FEF 25-75 Post: 1.65 L/s
FEF 25-75 Pre: 1.5 L/s
FEF2575-%Change-Post: 9 %
FEF2575-%Pred-Post: 61 %
FEF2575-%Pred-Pre: 56 %
FEV1-%Change-Post: 0 %
FEV1-%Pred-Post: 60 %
FEV1-%Pred-Pre: 60 %
FEV1-Post: 1.66 L
FEV1-Pre: 1.65 L
FEV1FVC-%Change-Post: 7 %
FEV1FVC-%Pred-Pre: 99 %
FEV6-%Change-Post: -5 %
FEV6-%Pred-Post: 57 %
FEV6-%Pred-Pre: 61 %
FEV6-Post: 1.94 L
FEV6-Pre: 2.07 L
FEV6FVC-%Change-Post: 0 %
FEV6FVC-%Pred-Post: 102 %
FEV6FVC-%Pred-Pre: 102 %
FVC-%Change-Post: -6 %
FVC-%Pred-Post: 56 %
FVC-%Pred-Pre: 59 %
FVC-Post: 1.94 L
FVC-Pre: 2.07 L
Post FEV1/FVC ratio: 85 %
Post FEV6/FVC ratio: 100 %
Pre FEV1/FVC ratio: 80 %
Pre FEV6/FVC Ratio: 100 %
RV % pred: 64 %
RV: 1.42 L
TLC % pred: 57 %
TLC: 3.43 L

## 2019-10-13 NOTE — Assessment & Plan Note (Signed)
Plan: Continue to weigh yourself daily Monitor for increased lower extremity swelling or increased weights Walk today in office

## 2019-10-13 NOTE — Assessment & Plan Note (Signed)
Plan: Continue prednisone 10 mg daily for the next 2 weeks Close follow-up with Dr. Loanne Drilling later on this month with 6-minute walk Consider repeating pulmonary function testing in 6 to 12 months Can consider repeating CT imaging in 6 to 12 months based off patient's clinical symptoms Offered antifibrotic's, patient will consider

## 2019-10-13 NOTE — Assessment & Plan Note (Signed)
Plan: Continue Rituxan infusions Continue to follow-up with [facility] rheumatology Continue 10 mg of prednisone for the next 2 weeks

## 2019-10-13 NOTE — Progress Notes (Signed)
Discussed results with patient in office.  Nothing further is needed at this time.  Jazminn Pomales FNP  

## 2019-10-13 NOTE — Patient Instructions (Addendum)
You were seen today by Lauraine Rinne, NP  for:   1. Interstitial lung disease due to connective tissue disease ([place]) 2. 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 Rheumatoid arthritis, involving unspecified site, unspecified whether rheumatoid factor present ([place]) 4. Moderate to severe pulmonary hypertension (HCC)  We will coordinate close follow-up with our office to see Dr. Loanne Drilling on 10/28/2019 with a 6-minute walk  We reviewed her pulmonary function testing today  Simple walk in office today  Continue 10 mg of prednisone daily  Continue Rituxan infusions, next scheduled infusion by [facility] rheumatology is in June/2021  Contact our office with any questions or concerns   Follow Up:    Return in about 15 days (around 10/28/2019) for Follow up with Dr. Loanne Drilling.   Please do your part to reduce the spread of COVID-19:      Reduce your risk of any infection  and COVID19 by using the similar precautions used for avoiding the common cold or flu:  Marland Kitchen Wash your hands often with soap and warm water for at least 20 seconds.  If soap and water are not readily available, use an alcohol-based hand sanitizer with at least 60% alcohol.  . If coughing or sneezing, cover your mouth and nose by coughing or sneezing into the elbow areas of your shirt or coat, into a tissue or into your sleeve (not your hands). Langley Gauss A MASK when in public  . Avoid shaking hands with others and consider head nods or verbal greetings only. . Avoid touching your eyes, nose, or mouth with unwashed hands.  . Avoid close contact with people who are sick. . Avoid places or events with large numbers of people in one location, like concerts or sporting events. . If you have some symptoms but not all symptoms, continue to monitor at home and seek medical attention if your symptoms worsen. . If you are having a medical emergency, call 911.   [place] / e-Visit: [email or web address]         [facility]: [facility]: [id number]                   [facility]: [id number]     It is flu season:   >>> Best ways to protect herself from the flu: Receive the yearly flu vaccine, practice good hand hygiene washing with soap and also using hand sanitizer when available, eat a nutritious meals, get adequate rest, hydrate appropriately   Please contact the office if your symptoms worsen or you have concerns that you are not improving.   Thank you for choosing [facility] for your healthcare, and for allowing [place] to partner with you on your healthcare journey. I am thankful to be able to provide care to you today.   Wyn Quaker FNP-C

## 2019-10-13 NOTE — Progress Notes (Signed)
PFT done today. 

## 2019-10-13 NOTE — Assessment & Plan Note (Signed)
Plan: Continue prednisone 10 mg daily for the next 2 weeks Reviewed pulmonary function testing today Offered antifibrotic seen to the patient, she will consider this Likely will need to repeat pulmonary function testing in 6 to 12 months Continue follow-up with rheumatology Continue Rituxan infusion

## 2019-10-21 ENCOUNTER — Other Ambulatory Visit: Payer: Self-pay | Admitting: Pulmonary Disease

## 2019-10-21 NOTE — Telephone Encounter (Signed)
Dr. Loanne Drilling, please advise if it is okay to refill med for pt or if the instructions and quantity need to be changed any?

## 2019-10-28 ENCOUNTER — Ambulatory Visit: Payer: BC Managed Care – PPO | Admitting: Pulmonary Disease

## 2019-10-28 ENCOUNTER — Ambulatory Visit: Payer: BC Managed Care – PPO

## 2019-10-28 NOTE — Progress Notes (Deleted)
Virtual Visit via Telephone Note  I connected with Carolyn Proctor on 10/28/19 at  9:30 AM EST by telephone and verified that I am speaking with the correct person using two identifiers.  Location: Patient: Home Provider: [facility]   I discussed the limitations, risks, security and privacy concerns of performing an evaluation and management service by telephone and the availability of in person appointments. I also discussed with the patient that there may be a patient responsible charge related to this service. The patient expressed understanding and agreed to proceed.   I discussed the assessment and treatment plan with the patient. The patient was provided an opportunity to ask questions and all were answered. The patient agreed with the plan and demonstrated an understanding of the instructions.   The patient was advised to call back or seek an in-person evaluation if the symptoms worsen or if the condition fails to improve as anticipated.  I provided 21 minutes of non-face-to-face time during this encounter.   Carolyn Spanos Rodman Pickle, MD   Subjective:   PATIENT ID: Carolyn Proctor GENDER: female DOB: 1961/02/13, MRN: [id number]   HPI  No chief complaint on file.  Reason for Visit: Follow-up  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   Since our last visit she underwent RHC which demonstrated moderate pulmonary hypertension. She has been asymptomatic. Denies shortness of breath, chest pain, cough or wheezing. Denies lower extremity edema, palpitations. For her RA, she received her Rituxan infusion on 09/16/19 with next planned infusion 09/30/19. Denies any adverse reactions. Has not had to wear oxygen since she restarted work and denies any issues  I have personally reviewed patient's past  medical/family/social history/allergies/current medications.  Past Medical History:  Diagnosis Date  . Acute myopericarditis 02/24/2013  . Atrial fibrillation ([place])   . Atrial fibrillation with RVR ([place]) 02/22/2013  . Chronic anticoagulation, Xarelto 04/02/2013  . Chronic diastolic CHF (congestive heart failure) ([place]) 06/26/2019  . Diverticulitis   . Elevated troponin- secondary to Myopericarditis 02/25/2013  . Emphysema  07/07/2019  . Hypertension   . Lupus ([place]) 09/14/2011  . Multifocal atrial tachycardia ([place]) 02/22/2013  . PAF (paroxysmal atrial fibrillation) ([place]) 04/02/2013  . Pleuritic chest pain 02/22/2013  . 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 Rheumatoid arthritis ([place]) 07/07/2019  . Rheumatoid lung disease ([place]) 06/23/2019  . Right ventricular dysfunction 08/05/2019  . Thrombocytosis ([place]) 09/14/2011     Family History  Problem Relation Age of Onset  . Lupus Mother   . Liver disease Mother   . Hypertension Maternal Grandmother   . Lupus Brother      Social History   Occupational History  . Not on file  Tobacco Use  . Smoking status: Never Smoker  . Smokeless tobacco: Never Used  Substance and Sexual Activity  . Alcohol use: No  . Drug use: No  . Sexual activity: Never    No Known Allergies   Outpatient Medications Prior to Visit  Medication Sig Dispense Refill  . albuterol (VENTOLIN HFA) 108 (90 Base) MCG/ACT inhaler Inhale 2 puffs into the lungs every 6 (six) hours as needed for wheezing or shortness of breath. 8 g 2  .  Calcium Carbonate-Vit D-Min (CALCIUM 1200 PO) Take 1 capsule by mouth daily.    . Cholecalciferol (VITAMIN D) 50 MCG (2000 UT) tablet Take 2,000 Units by mouth daily.     . Cyanocobalamin (VITAMIN B-12) 1000 MCG SUBL Place 1,000 mcg under the tongue.    . furosemide (LASIX) 20 MG tablet Take 1 tablet (20 mg  total) by mouth 3 (three) times a week. 36 tablet 3  . pantoprazole (PROTONIX) 40 MG tablet Take 1 tablet (40 mg total) by mouth daily. 90 tablet 0  . predniSONE (DELTASONE) 5 MG tablet TAKE 2 TABLETS EVERY DAY 60 tablet 2   No facility-administered medications prior to visit.    Review of Systems  Constitutional: Negative for chills, diaphoresis, fever, malaise/fatigue and weight loss.  HENT: Negative for congestion.   Respiratory: Negative for cough, hemoptysis, sputum production, shortness of breath and wheezing.   Cardiovascular: Negative for chest pain, palpitations and leg swelling.     Objective:   There were no vitals filed for this visit.    Physical Exam: No acute distress on the phone. Able to speak full sentences.  Data Reviewed:  Imaging: 06/23/19 CTA - No PE, mild pericardial effusion, diffuse emphysema, bibasilar pulmonary edema and effusion. Significant progressive subpleural fibrotic changes including honeycombing, diffuse involvement but predominantly worse at the lung bases 06/23/19 CT A/P - Mild pericholecystic fluid, trace simple free fluid in deep pelvis and mild body wall edema, circumferential bladder wall thickening  [facility] XR Extremities Left hand: Erosive arthropathy involving the 2nd 3rd and 4th PIP, 1st 2nd and 3rd MCP, CMC, intercarpal, and radiocarpal joints with erosions of the distal radius and ulnar styloid.  Right hand: Erosive arthropathy involving the 3rd DIP, 2nd 3rd 4th and 5th PIP, 1st through 5th MCP joints. Radiocarpal fusion/ankylosis with erosive changes involving the lateral intercarpal joints. Erosions of the ulnar styloid. Valgus deviation of the 3rd PIP joint and varus deviation of the 4th PIP joint.  Left foot: Erosive arthropathy involving the IP joint of the great toe and 2nd through 5th MTP joints. Hallux valgus.  Right foot: Erosive arthropathy involving the IP joint of the great toe and 1st through 5th MTP joints.    IMPRESSION: FINDINGS compatible with severe rheumatoid arthritis.  PFT: 10/13/2019 FVC 1.94 (56%) FEV1 1.66 (60%) Ratio 80 TLC 57% DLCO 26% Interpretation: Moderate restrictive defect with severely reduced gas exchange  Labs: [facility] 07/28/19 RNP ab >8 Smith ab >1 SSA 2.2 SSB <0.2 ANA 1:320 RA quant 72.6 CCP >250 C4 20  Imaging, labs and test noted above have been reviewed independently by me.    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  Previously approved for antifibrotic which she has not started.  Currently receiving Rituxan with rheumatology with first dose on 09/16/2019.  Last dose on 12/23.  Currently on prednisone taper.  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 Previously approved for anti-fibrotic however she declined at this time. Currently receiving Rituxan with Rheumatology. Started on prednisone taper today on 12/21 with plan to wean off.  RA-ILD --RA management per Dr. Posey Pronto at [facility] in [place]            S/p [place] 12/9. Next dose scheduled 12/23. --START Prednisone taper.  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 No indication for advanced therapies at this time  Health Maintenance Immunization History  Administered Date(s) Administered  . IPV 07/26/2006  . Typhoid Live 07/26/2006  . Yellow Fever 07/26/2006   Patient declined influenza and pneumococcal until she knows the status of her Rituxan  No orders of the defined types were placed in this encounter.  No orders of the defined types were placed in this encounter.  No follow-ups on file.  [place], MD [facility] 10/28/2019 8:26 AM  Office Number [phone]

## 2019-10-30 NOTE — Telephone Encounter (Signed)
10/30/2019 1547  Ms. Haigh,  I am also concerned regarding her symptoms.  More importantly I am concerned that you canceled your follow-up with Dr. Loanne Drilling.  We will need to get you rescheduled.  You can follow-up with primary care regarding the symptoms.  You must keep your next follow-up scheduled with Dr. Loanne Drilling.  It is exceptionally important that you have close follow-up with our office.  This is paramount for management of your lung disease as well as your health moving forward.  Patient can always be scheduled with an APP but patient will also need an appointment with Dr. Loanne Drilling.  Please schedule her next available.  Please route the message to Dr. Loanne Drilling once the patient is scheduled.  As FYI.  Wyn Quaker, FNP

## 2020-01-15 ENCOUNTER — Other Ambulatory Visit: Payer: Self-pay | Admitting: Pulmonary Disease

## 2020-02-10 NOTE — Telephone Encounter (Signed)
Sarah, please see pt's mychart message and advise on it.

## 2020-02-10 NOTE — Telephone Encounter (Signed)
Magdalen Spatz, NP  You 6 minutes ago (5:10 PM)   Please schedule this patient for a tele visit to discuss. She also has RA . We need to know how much pred she is on and go from there. Is there availability for tomorrow with one of the APPs?   Message text    Attempted to call pt to get appt scheduled with APP but unable to reach. Left pt a detailed message that we needed her to call to have an appt scheduled with APP for televisit. When pt returns call, please schedule pt a televisit.

## 2020-02-11 ENCOUNTER — Ambulatory Visit (INDEPENDENT_AMBULATORY_CARE_PROVIDER_SITE_OTHER): Payer: BC Managed Care – PPO | Admitting: Adult Health

## 2020-02-11 ENCOUNTER — Other Ambulatory Visit: Payer: Self-pay

## 2020-02-11 ENCOUNTER — Encounter: Payer: Self-pay | Admitting: Adult Health

## 2020-02-11 DIAGNOSIS — M069 Rheumatoid arthritis, unspecified: Secondary | ICD-10-CM | POA: Diagnosis not present

## 2020-02-11 DIAGNOSIS — J8410 Pulmonary fibrosis, unspecified: Secondary | ICD-10-CM | POA: Diagnosis not present

## 2020-02-11 NOTE — Patient Instructions (Addendum)
Decrease Prednisone 10mg  daily alternating with 5mg  for 2 weeks then 5mg  daily for 2 weeks then 5mg  alternating with 2.5mg  daily for 2 weeks then 2.5mg  daily for 2  Weeks  and then 2.5mg  every other day for 2 weeks and stop .  Reach out to your Rheumatology for a follow up .  Follow up in 6 -8 weeks with PFT and 6 min walk test .

## 2020-02-11 NOTE — Progress Notes (Addendum)
Reviewed and agree with plan as noted below.  Carolyn Proctor, M.D. [facility] 02/19/2020 3:26 PM    Virtual Visit via Telephone Note  I connected with Carolyn Proctor on 02/11/20 at 10:00 AM EDT by telephone and verified that I am speaking with the correct person using two identifiers.  Location: Patient: Home  Provider: Home    I discussed the limitations, risks, security and privacy concerns of performing an evaluation and management service by telephone and the availability of in person appointments. I also discussed with the patient that there may be a patient responsible charge related to this service. The patient expressed understanding and agreed to proceed.   History of Present Illness: 59 yo female never smoker followed for RA associated ILD  Medical history is significant for A. fib on anticoagulation, myocarditis, rheumatic mitral valve disease , rheumatoid arthritis on daily prednisone 10 mg daily and Rituxan -Follows with [facility] .    Today's Televisit is a 4 month  follow up for RA -ILD .  Patient has underlying rheumatoid arthritis associated ILD.  She was seen October 2020 for worsening symptoms of shortness of breath.  She had known interstitial lung disease associated to autoimmune disease order with rheumatoid arthritis.  She was followed by rheumatology with Plaquenil and prednisone few years ago.  But had not seen rheumatology or pulmonary in several years.  She was started on prednisone and referred to rheumatology at [facility] .  She was started on Rituxan which she received in December 2020.  She is currently on prednisone 10 mg.  She says overall her breathing has been doing better.  She has minimal cough and no increased dyspnea.  She does get winded with activity.  And is able to do some light housework.  She says her arthritis pain is much better controlled.  She says she has been dealing with some issues with her left foot.   She developed a blister that is now turned into a wound.  She is going to the wound center.  They recommended that she decrease her prednisone or even come off of it if possible as it is delaying her healing time and she also is possibly developing diabetes.   She is not on any oxygen.  Her pulmonary function testing in January did show a progressive decline in her lung function.  It was discussed with antifibrotic's but she declined at that time. She denies any increased cough congestion or shortness of breath.  Observations/Objective: Speaks in full sentences with no audible wheezing  10/13/2019-pulmonary function test-FVC 2.07 (59% predicted), postbronchodilator ratio 85, postbronchodilator FEV1 1.66 (60% predicted), no bronchodilator response, DLCO 6.70 (26% predicted Walk test w/ no desats.   06/23/19 CTA - No PE, mild pericardial effusion, diffuse emphysema, bibasilar pulmonary edema and effusion. Significant progressive subpleural fibrotic changes including honeycombing, diffuse involvement but predominantly worse at the lung bases  06/23/19 CT A/P - Mild pericholecystic fluid, trace simple free fluid in deep pelvis and mild body wall edema, circumferential bladder wall thickening  06/24/2019-echocardiogram-LV ejection fraction 60 to 65%, mildly increased left ventricular wall thickness, right ventricle is moderately reduced systolic function, cavity moderately enlarged, no increase in right ventricular wall thickness, small pericardial effusion, hockey-stick-like motion of the anterior mitral valve leaflet 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  06/11/2014-pulmonary function test-FVC 2.59 (72% predicted), postbronchodilator ratio 80, postbronchodilator FEV1 2.18 (75% predicted), positive bronchodilator response in FEV1, mid flow reversibility, DLCO 13.7 (42% predicted)  Assessment and Plan:  Rheumatoid arthritis associated ILD.  Patient has had a decline in her lung function.  Would  consider beginning antifibrotic's.  Have asked her to return for pulmonary function testing and 6-minute walk test on return. She seems to be doing improved from an arthritis standpoint since beginning Rituxan .  We will try to slowly taper prednisone very slowly.  With close follow-up.  Would also like for her to follow-up with rheumatology.  She says she has an upcoming appointment prior to her infusion next month.  Plan  Patient Instructions  Decrease Prednisone 10mg  daily alternating with 5mg  for 2 weeks then 5mg  daily for 2 weeks then 5mg  alternating with 2.5mg  daily for 2 weeks then 2.5mg  daily for 2  Weeks  and then 2.5mg  every other day for 2 weeks and stop .  Reach out to your Rheumatology for a follow up .  Follow up in 6 -8 weeks with PFT and 6 min walk test .      Follow Up Instructions: Follow-up in 6 weeks and as needed   I discussed the assessment and treatment plan with the patient. The patient was provided an opportunity to ask questions and all were answered. The patient agreed with the plan and demonstrated an understanding of the instructions.   The patient was advised to call back or seek an in-person evaluation if the symptoms worsen or if the condition fails to improve as anticipated.  I provided 28 minutes of non-face-to-face time during this encounter.   [name], NP

## 2020-02-12 ENCOUNTER — Telehealth: Payer: Self-pay | Admitting: Adult Health

## 2020-02-12 NOTE — Telephone Encounter (Signed)
Pt had a televisit with Tammy Parrett on 02/11/2020. Per pt's AVS instructions, she is to follow up with APP, PFT and 6MW in 6-8 weeks. LMTCB x1 for pt.

## 2020-02-16 NOTE — Telephone Encounter (Signed)
Spoke with pt. She has been scheduled for OV, PFT, 6MW and COVID testing. Nothing further needed.

## 2020-03-15 ENCOUNTER — Other Ambulatory Visit: Payer: Self-pay

## 2020-03-15 ENCOUNTER — Ambulatory Visit (INDEPENDENT_AMBULATORY_CARE_PROVIDER_SITE_OTHER): Payer: BC Managed Care – PPO

## 2020-03-15 DIAGNOSIS — J8410 Pulmonary fibrosis, unspecified: Secondary | ICD-10-CM

## 2020-03-15 DIAGNOSIS — J8489 Other specified interstitial pulmonary diseases: Secondary | ICD-10-CM

## 2020-03-15 DIAGNOSIS — M359 Systemic involvement of connective tissue, unspecified: Secondary | ICD-10-CM

## 2020-04-02 ENCOUNTER — Other Ambulatory Visit (HOSPITAL_COMMUNITY): Payer: BC Managed Care – PPO

## 2020-04-04 ENCOUNTER — Other Ambulatory Visit: Payer: Self-pay | Admitting: Pulmonary Disease

## 2020-04-04 DIAGNOSIS — M359 Systemic involvement of connective tissue, unspecified: Secondary | ICD-10-CM

## 2020-04-04 DIAGNOSIS — I272 Pulmonary hypertension, unspecified: Secondary | ICD-10-CM

## 2020-04-05 ENCOUNTER — Ambulatory Visit: Payer: BC Managed Care – PPO | Admitting: Adult Health

## 2020-06-22 ENCOUNTER — Other Ambulatory Visit (HOSPITAL_COMMUNITY): Payer: BC Managed Care – PPO

## 2020-06-22 ENCOUNTER — Inpatient Hospital Stay (HOSPITAL_COMMUNITY): Payer: BC Managed Care – PPO

## 2020-06-22 ENCOUNTER — Inpatient Hospital Stay (HOSPITAL_COMMUNITY)
Admission: EM | Admit: 2020-06-22 | Discharge: 2020-07-26 | DRG: 004 | Disposition: A | Discharge status: Other Acute Inpatient Hospital | Payer: BC Managed Care – PPO | Attending: Family Medicine | Admitting: Family Medicine

## 2020-06-22 ENCOUNTER — Emergency Department (HOSPITAL_COMMUNITY): Payer: BC Managed Care – PPO

## 2020-06-22 DIAGNOSIS — I2699 Other pulmonary embolism without acute cor pulmonale: Secondary | ICD-10-CM | POA: Diagnosis present

## 2020-06-22 DIAGNOSIS — I340 Nonrheumatic mitral (valve) insufficiency: Secondary | ICD-10-CM

## 2020-06-22 DIAGNOSIS — I2609 Other pulmonary embolism with acute cor pulmonale: Secondary | ICD-10-CM | POA: Diagnosis not present

## 2020-06-22 DIAGNOSIS — E871 Hypo-osmolality and hyponatremia: Secondary | ICD-10-CM | POA: Diagnosis present

## 2020-06-22 DIAGNOSIS — N390 Urinary tract infection, site not specified: Secondary | ICD-10-CM | POA: Diagnosis present

## 2020-06-22 DIAGNOSIS — R059 Cough, unspecified: Secondary | ICD-10-CM

## 2020-06-22 DIAGNOSIS — L89896 Pressure-induced deep tissue damage of other site: Secondary | ICD-10-CM | POA: Diagnosis present

## 2020-06-22 DIAGNOSIS — E1152 Type 2 diabetes mellitus with diabetic peripheral angiopathy with gangrene: Secondary | ICD-10-CM | POA: Diagnosis present

## 2020-06-22 DIAGNOSIS — I471 Supraventricular tachycardia: Secondary | ICD-10-CM | POA: Diagnosis present

## 2020-06-22 DIAGNOSIS — I5082 Biventricular heart failure: Secondary | ICD-10-CM | POA: Diagnosis not present

## 2020-06-22 DIAGNOSIS — I272 Pulmonary hypertension, unspecified: Secondary | ICD-10-CM

## 2020-06-22 DIAGNOSIS — R7982 Elevated C-reactive protein (CRP): Secondary | ICD-10-CM | POA: Diagnosis not present

## 2020-06-22 DIAGNOSIS — E43 Unspecified severe protein-calorie malnutrition: Secondary | ICD-10-CM | POA: Diagnosis present

## 2020-06-22 DIAGNOSIS — E15 Nondiabetic hypoglycemic coma: Secondary | ICD-10-CM

## 2020-06-22 DIAGNOSIS — Z20822 Contact with and (suspected) exposure to covid-19: Secondary | ICD-10-CM | POA: Diagnosis present

## 2020-06-22 DIAGNOSIS — E874 Mixed disorder of acid-base balance: Secondary | ICD-10-CM | POA: Diagnosis present

## 2020-06-22 DIAGNOSIS — R6521 Severe sepsis with septic shock: Secondary | ICD-10-CM | POA: Diagnosis present

## 2020-06-22 DIAGNOSIS — K7200 Acute and subacute hepatic failure without coma: Secondary | ICD-10-CM | POA: Diagnosis present

## 2020-06-22 DIAGNOSIS — I130 Hypertensive heart and chronic kidney disease with heart failure and stage 1 through stage 4 chronic kidney disease, or unspecified chronic kidney disease: Secondary | ICD-10-CM | POA: Diagnosis present

## 2020-06-22 DIAGNOSIS — Z7952 Long term (current) use of systemic steroids: Secondary | ICD-10-CM

## 2020-06-22 DIAGNOSIS — Z86711 Personal history of pulmonary embolism: Secondary | ICD-10-CM

## 2020-06-22 DIAGNOSIS — M329 Systemic lupus erythematosus, unspecified: Secondary | ICD-10-CM | POA: Diagnosis present

## 2020-06-22 DIAGNOSIS — Z1612 Extended spectrum beta lactamase (ESBL) resistance: Secondary | ICD-10-CM | POA: Diagnosis present

## 2020-06-22 DIAGNOSIS — Z930 Tracheostomy status: Secondary | ICD-10-CM | POA: Diagnosis not present

## 2020-06-22 DIAGNOSIS — D539 Nutritional anemia, unspecified: Secondary | ICD-10-CM | POA: Diagnosis not present

## 2020-06-22 DIAGNOSIS — E870 Hyperosmolality and hypernatremia: Secondary | ICD-10-CM | POA: Diagnosis present

## 2020-06-22 DIAGNOSIS — E11649 Type 2 diabetes mellitus with hypoglycemia without coma: Secondary | ICD-10-CM | POA: Diagnosis present

## 2020-06-22 DIAGNOSIS — I472 Ventricular tachycardia: Secondary | ICD-10-CM | POA: Diagnosis present

## 2020-06-22 DIAGNOSIS — J9621 Acute and chronic respiratory failure with hypoxia: Secondary | ICD-10-CM | POA: Diagnosis not present

## 2020-06-22 DIAGNOSIS — R1310 Dysphagia, unspecified: Secondary | ICD-10-CM | POA: Diagnosis present

## 2020-06-22 DIAGNOSIS — Z681 Body mass index (BMI) 19 or less, adult: Secondary | ICD-10-CM

## 2020-06-22 DIAGNOSIS — Y929 Unspecified place or not applicable: Secondary | ICD-10-CM | POA: Diagnosis not present

## 2020-06-22 DIAGNOSIS — I872 Venous insufficiency (chronic) (peripheral): Secondary | ICD-10-CM | POA: Diagnosis present

## 2020-06-22 DIAGNOSIS — R569 Unspecified convulsions: Secondary | ICD-10-CM | POA: Diagnosis not present

## 2020-06-22 DIAGNOSIS — M069 Rheumatoid arthritis, unspecified: Secondary | ICD-10-CM | POA: Diagnosis present

## 2020-06-22 DIAGNOSIS — G40909 Epilepsy, unspecified, not intractable, without status epilepticus: Secondary | ICD-10-CM | POA: Diagnosis present

## 2020-06-22 DIAGNOSIS — J8410 Pulmonary fibrosis, unspecified: Secondary | ICD-10-CM | POA: Diagnosis not present

## 2020-06-22 DIAGNOSIS — G901 Familial dysautonomia [Riley-Day]: Secondary | ICD-10-CM | POA: Diagnosis present

## 2020-06-22 DIAGNOSIS — I313 Pericardial effusion (noninflammatory): Secondary | ICD-10-CM | POA: Diagnosis present

## 2020-06-22 DIAGNOSIS — R570 Cardiogenic shock: Secondary | ICD-10-CM

## 2020-06-22 DIAGNOSIS — I4892 Unspecified atrial flutter: Secondary | ICD-10-CM | POA: Diagnosis present

## 2020-06-22 DIAGNOSIS — Z9889 Other specified postprocedural states: Secondary | ICD-10-CM | POA: Diagnosis not present

## 2020-06-22 DIAGNOSIS — N183 Chronic kidney disease, stage 3 unspecified: Secondary | ICD-10-CM | POA: Diagnosis present

## 2020-06-22 DIAGNOSIS — G9341 Metabolic encephalopathy: Secondary | ICD-10-CM | POA: Diagnosis present

## 2020-06-22 DIAGNOSIS — I50810 Right heart failure, unspecified: Secondary | ICD-10-CM | POA: Diagnosis not present

## 2020-06-22 DIAGNOSIS — I361 Nonrheumatic tricuspid (valve) insufficiency: Secondary | ICD-10-CM | POA: Diagnosis not present

## 2020-06-22 DIAGNOSIS — R079 Chest pain, unspecified: Secondary | ICD-10-CM | POA: Diagnosis present

## 2020-06-22 DIAGNOSIS — I776 Arteritis, unspecified: Secondary | ICD-10-CM | POA: Diagnosis present

## 2020-06-22 DIAGNOSIS — K7210 Chronic hepatic failure without coma: Secondary | ICD-10-CM | POA: Diagnosis present

## 2020-06-22 DIAGNOSIS — I480 Paroxysmal atrial fibrillation: Secondary | ICD-10-CM | POA: Diagnosis present

## 2020-06-22 DIAGNOSIS — I2781 Cor pulmonale (chronic): Secondary | ICD-10-CM | POA: Diagnosis present

## 2020-06-22 DIAGNOSIS — Z7901 Long term (current) use of anticoagulants: Secondary | ICD-10-CM

## 2020-06-22 DIAGNOSIS — J9600 Acute respiratory failure, unspecified whether with hypoxia or hypercapnia: Principal | ICD-10-CM

## 2020-06-22 DIAGNOSIS — I509 Heart failure, unspecified: Secondary | ICD-10-CM

## 2020-06-22 DIAGNOSIS — I5032 Chronic diastolic (congestive) heart failure: Secondary | ICD-10-CM | POA: Diagnosis not present

## 2020-06-22 DIAGNOSIS — N170 Acute kidney failure with tubular necrosis: Secondary | ICD-10-CM | POA: Diagnosis not present

## 2020-06-22 DIAGNOSIS — R64 Cachexia: Secondary | ICD-10-CM | POA: Diagnosis present

## 2020-06-22 DIAGNOSIS — I5021 Acute systolic (congestive) heart failure: Secondary | ICD-10-CM | POA: Diagnosis present

## 2020-06-22 DIAGNOSIS — D75839 Thrombocytosis, unspecified: Secondary | ICD-10-CM | POA: Diagnosis present

## 2020-06-22 DIAGNOSIS — J432 Centrilobular emphysema: Secondary | ICD-10-CM | POA: Diagnosis present

## 2020-06-22 DIAGNOSIS — Z86718 Personal history of other venous thrombosis and embolism: Secondary | ICD-10-CM

## 2020-06-22 DIAGNOSIS — B9620 Unspecified Escherichia coli [E. coli] as the cause of diseases classified elsewhere: Secondary | ICD-10-CM

## 2020-06-22 DIAGNOSIS — Z79899 Other long term (current) drug therapy: Secondary | ICD-10-CM

## 2020-06-22 DIAGNOSIS — R6881 Early satiety: Secondary | ICD-10-CM | POA: Diagnosis present

## 2020-06-22 DIAGNOSIS — T380X5A Adverse effect of glucocorticoids and synthetic analogues, initial encounter: Secondary | ICD-10-CM | POA: Diagnosis present

## 2020-06-22 DIAGNOSIS — J155 Pneumonia due to Escherichia coli: Secondary | ICD-10-CM | POA: Diagnosis present

## 2020-06-22 DIAGNOSIS — E1122 Type 2 diabetes mellitus with diabetic chronic kidney disease: Secondary | ICD-10-CM | POA: Diagnosis present

## 2020-06-22 DIAGNOSIS — A419 Sepsis, unspecified organism: Secondary | ICD-10-CM | POA: Diagnosis not present

## 2020-06-22 DIAGNOSIS — U071 COVID-19: Secondary | ICD-10-CM | POA: Diagnosis not present

## 2020-06-22 DIAGNOSIS — E875 Hyperkalemia: Secondary | ICD-10-CM | POA: Diagnosis present

## 2020-06-22 DIAGNOSIS — D692 Other nonthrombocytopenic purpura: Secondary | ICD-10-CM | POA: Diagnosis present

## 2020-06-22 DIAGNOSIS — A4151 Sepsis due to Escherichia coli [E. coli]: Principal | ICD-10-CM | POA: Diagnosis present

## 2020-06-22 DIAGNOSIS — Y95 Nosocomial condition: Secondary | ICD-10-CM | POA: Diagnosis present

## 2020-06-22 DIAGNOSIS — J9690 Respiratory failure, unspecified, unspecified whether with hypoxia or hypercapnia: Secondary | ICD-10-CM

## 2020-06-22 DIAGNOSIS — K7460 Unspecified cirrhosis of liver: Secondary | ICD-10-CM | POA: Diagnosis present

## 2020-06-22 DIAGNOSIS — J9611 Chronic respiratory failure with hypoxia: Secondary | ICD-10-CM

## 2020-06-22 DIAGNOSIS — E1165 Type 2 diabetes mellitus with hyperglycemia: Secondary | ICD-10-CM | POA: Diagnosis present

## 2020-06-22 DIAGNOSIS — L97509 Non-pressure chronic ulcer of other part of unspecified foot with unspecified severity: Secondary | ICD-10-CM | POA: Diagnosis present

## 2020-06-22 DIAGNOSIS — R0902 Hypoxemia: Secondary | ICD-10-CM

## 2020-06-22 DIAGNOSIS — L89329 Pressure ulcer of left buttock, unspecified stage: Secondary | ICD-10-CM | POA: Diagnosis not present

## 2020-06-22 DIAGNOSIS — Z9289 Personal history of other medical treatment: Secondary | ICD-10-CM

## 2020-06-22 DIAGNOSIS — Z832 Family history of diseases of the blood and blood-forming organs and certain disorders involving the immune mechanism: Secondary | ICD-10-CM

## 2020-06-22 DIAGNOSIS — R0602 Shortness of breath: Secondary | ICD-10-CM

## 2020-06-22 DIAGNOSIS — E876 Hypokalemia: Secondary | ICD-10-CM | POA: Diagnosis not present

## 2020-06-22 DIAGNOSIS — J9601 Acute respiratory failure with hypoxia: Secondary | ICD-10-CM | POA: Diagnosis present

## 2020-06-22 DIAGNOSIS — S279XXA Injury of unspecified intrathoracic organ, initial encounter: Secondary | ICD-10-CM

## 2020-06-22 DIAGNOSIS — J189 Pneumonia, unspecified organism: Secondary | ICD-10-CM | POA: Diagnosis not present

## 2020-06-22 DIAGNOSIS — D6869 Other thrombophilia: Secondary | ICD-10-CM | POA: Diagnosis present

## 2020-06-22 DIAGNOSIS — G9340 Encephalopathy, unspecified: Secondary | ICD-10-CM | POA: Diagnosis not present

## 2020-06-22 DIAGNOSIS — J9811 Atelectasis: Secondary | ICD-10-CM | POA: Diagnosis present

## 2020-06-22 DIAGNOSIS — I248 Other forms of acute ischemic heart disease: Secondary | ICD-10-CM | POA: Diagnosis present

## 2020-06-22 DIAGNOSIS — Z8249 Family history of ischemic heart disease and other diseases of the circulatory system: Secondary | ICD-10-CM

## 2020-06-22 DIAGNOSIS — R54 Age-related physical debility: Secondary | ICD-10-CM | POA: Diagnosis present

## 2020-06-22 DIAGNOSIS — L89319 Pressure ulcer of right buttock, unspecified stage: Secondary | ICD-10-CM | POA: Diagnosis not present

## 2020-06-22 DIAGNOSIS — G931 Anoxic brain damage, not elsewhere classified: Secondary | ICD-10-CM | POA: Diagnosis present

## 2020-06-22 DIAGNOSIS — Z9911 Dependence on respirator [ventilator] status: Secondary | ICD-10-CM

## 2020-06-22 DIAGNOSIS — I2721 Secondary pulmonary arterial hypertension: Secondary | ICD-10-CM | POA: Diagnosis present

## 2020-06-22 DIAGNOSIS — J80 Acute respiratory distress syndrome: Secondary | ICD-10-CM | POA: Diagnosis not present

## 2020-06-22 LAB — COMPREHENSIVE METABOLIC PANEL
ALT: 15 U/L (ref 0–44)
AST: 37 U/L (ref 15–41)
Albumin: 2.2 g/dL — ABNORMAL LOW (ref 3.5–5.0)
Alkaline Phosphatase: 112 U/L (ref 38–126)
Anion gap: 19 — ABNORMAL HIGH (ref 5–15)
BUN: 16 mg/dL (ref 6–20)
CO2: 10 mmol/L — ABNORMAL LOW (ref 22–32)
Calcium: 7.9 mg/dL — ABNORMAL LOW (ref 8.9–10.3)
Chloride: 103 mmol/L (ref 98–111)
Creatinine, Ser: 1.38 mg/dL — ABNORMAL HIGH (ref 0.44–1.00)
GFR calc Af Amer: 49 mL/min — ABNORMAL LOW (ref >60)
GFR calc non Af Amer: 43 mL/min — ABNORMAL LOW (ref >60)
Glucose, Bld: 146 mg/dL — ABNORMAL HIGH (ref 70–99)
Potassium: 4.7 mmol/L (ref 3.5–5.1)
Sodium: 132 mmol/L — ABNORMAL LOW (ref 135–145)
Total Bilirubin: 2.9 mg/dL — ABNORMAL HIGH (ref 0.3–1.2)
Total Protein: 6.9 g/dL (ref 6.5–8.1)

## 2020-06-22 LAB — I-STAT ARTERIAL BLOOD GAS, ED
Acid-base deficit: 22 mmol/L — ABNORMAL HIGH (ref 0.0–2.0)
Acid-base deficit: 13 mmol/L — ABNORMAL HIGH (ref 0.0–2.0)
Bicarbonate: 9.6 mmol/L — ABNORMAL LOW (ref 20.0–28.0)
Bicarbonate: 12.9 mmol/L — ABNORMAL LOW (ref 20.0–28.0)
Calcium, Ion: 1.05 mmol/L — ABNORMAL LOW (ref 1.15–1.40)
Calcium, Ion: 1 mmol/L — ABNORMAL LOW (ref 1.15–1.40)
HCT: 44 % (ref 36.0–46.0)
HCT: 39 % (ref 36.0–46.0)
Hemoglobin: 15 g/dL (ref 12.0–15.0)
Hemoglobin: 13.3 g/dL (ref 12.0–15.0)
O2 Saturation: 100 %
O2 Saturation: 96 %
Potassium: 3.7 mmol/L (ref 3.5–5.1)
Potassium: 3.4 mmol/L — ABNORMAL LOW (ref 3.5–5.1)
Sodium: 132 mmol/L — ABNORMAL LOW (ref 135–145)
Sodium: 133 mmol/L — ABNORMAL LOW (ref 135–145)
TCO2: 11 mmol/L — ABNORMAL LOW (ref 22–32)
TCO2: 14 mmol/L — ABNORMAL LOW (ref 22–32)
pCO2 arterial: 43.4 mmHg (ref 32.0–48.0)
pCO2 arterial: 29.4 mmHg — ABNORMAL LOW (ref 32.0–48.0)
pH, Arterial: 6.954 — CL (ref 7.350–7.450)
pH, Arterial: 7.251 — ABNORMAL LOW (ref 7.350–7.450)
pO2, Arterial: 313 mmHg — ABNORMAL HIGH (ref 83.0–108.0)
pO2, Arterial: 96 mmHg (ref 83.0–108.0)

## 2020-06-22 LAB — CBC WITH DIFFERENTIAL/PLATELET
Abs Immature Granulocytes: 0 10*3/uL (ref 0.00–0.07)
Basophils Absolute: 0.1 10*3/uL (ref 0.0–0.1)
Basophils Relative: 1 %
Eosinophils Absolute: 0.4 10*3/uL (ref 0.0–0.5)
Eosinophils Relative: 4 %
HCT: 46.3 % — ABNORMAL HIGH (ref 36.0–46.0)
Hemoglobin: 13.2 g/dL (ref 12.0–15.0)
Lymphocytes Relative: 8 %
Lymphs Abs: 0.7 10*3/uL (ref 0.7–4.0)
MCH: 31.7 pg (ref 26.0–34.0)
MCHC: 28.5 g/dL — ABNORMAL LOW (ref 30.0–36.0)
MCV: 111 fL — ABNORMAL HIGH (ref 80.0–100.0)
Monocytes Absolute: 0.7 10*3/uL (ref 0.1–1.0)
Monocytes Relative: 7 %
Neutro Abs: 7.4 10*3/uL (ref 1.7–7.7)
Neutrophils Relative %: 80 %
Platelets: 248 10*3/uL (ref 150–400)
RBC: 4.17 MIL/uL (ref 3.87–5.11)
RDW: 19.1 % — ABNORMAL HIGH (ref 11.5–15.5)
WBC: 9.3 10*3/uL (ref 4.0–10.5)
nRBC: 0 /100 WBC
nRBC: 0.2 % (ref 0.0–0.2)

## 2020-06-22 LAB — VOLATILES,BLD-ACETONE,ETHANOL,ISOPROP,METHANOL
Acetone, blood: <0.01 g/dL (ref 0.000–0.010)
Ethanol, blood: <0.01 g/dL (ref 0.000–0.010)
Isopropanol, blood: <0.01 g/dL (ref 0.000–0.010)
Methanol, blood: <0.01 g/dL (ref 0.000–0.010)

## 2020-06-22 LAB — OSMOLALITY: Osmolality: 309 mOsm/kg — ABNORMAL HIGH (ref 275–295)

## 2020-06-22 LAB — CBG MONITORING, ED
Glucose-Capillary: 134 mg/dL — ABNORMAL HIGH (ref 70–99)
Glucose-Capillary: 201 mg/dL — ABNORMAL HIGH (ref 70–99)
Glucose-Capillary: 116 mg/dL — ABNORMAL HIGH (ref 70–99)
Glucose-Capillary: 136 mg/dL — ABNORMAL HIGH (ref 70–99)
Glucose-Capillary: 31 mg/dL — CL (ref 70–99)
Glucose-Capillary: 50 mg/dL — ABNORMAL LOW (ref 70–99)
Glucose-Capillary: <10 mg/dL — CL (ref 70–99)
Glucose-Capillary: 258 mg/dL — ABNORMAL HIGH (ref 70–99)
Glucose-Capillary: 328 mg/dL — ABNORMAL HIGH (ref 70–99)

## 2020-06-22 LAB — GLUCOSE, CAPILLARY
Glucose-Capillary: 341 mg/dL — ABNORMAL HIGH (ref 70–99)
Glucose-Capillary: 446 mg/dL — ABNORMAL HIGH (ref 70–99)
Glucose-Capillary: 437 mg/dL — ABNORMAL HIGH (ref 70–99)
Glucose-Capillary: 345 mg/dL — ABNORMAL HIGH (ref 70–99)
Glucose-Capillary: 389 mg/dL — ABNORMAL HIGH (ref 70–99)
Glucose-Capillary: 327 mg/dL — ABNORMAL HIGH (ref 70–99)
Glucose-Capillary: 308 mg/dL — ABNORMAL HIGH (ref 70–99)

## 2020-06-22 LAB — I-STAT CHEM 8, ED
BUN: 19 mg/dL (ref 6–20)
Calcium, Ion: 1.04 mmol/L — ABNORMAL LOW (ref 1.15–1.40)
Chloride: 105 mmol/L (ref 98–111)
Creatinine, Ser: 1.1 mg/dL — ABNORMAL HIGH (ref 0.44–1.00)
Glucose, Bld: 134 mg/dL — ABNORMAL HIGH (ref 70–99)
HCT: 49 % — ABNORMAL HIGH (ref 36.0–46.0)
Hemoglobin: 16.7 g/dL — ABNORMAL HIGH (ref 12.0–15.0)
Potassium: 4.7 mmol/L (ref 3.5–5.1)
Sodium: 133 mmol/L — ABNORMAL LOW (ref 135–145)
TCO2: 15 mmol/L — ABNORMAL LOW (ref 22–32)

## 2020-06-22 LAB — POCT I-STAT 7, (LYTES, BLD GAS, ICA,H+H)
Acid-base deficit: 6 mmol/L — ABNORMAL HIGH (ref 0.0–2.0)
Bicarbonate: 17.6 mmol/L — ABNORMAL LOW (ref 20.0–28.0)
Calcium, Ion: 1.06 mmol/L — ABNORMAL LOW (ref 1.15–1.40)
HCT: 47 % — ABNORMAL HIGH (ref 36.0–46.0)
Hemoglobin: 16 g/dL — ABNORMAL HIGH (ref 12.0–15.0)
O2 Saturation: 98 %
Patient temperature: 37.2
Potassium: 3.4 mmol/L — ABNORMAL LOW (ref 3.5–5.1)
Sodium: 136 mmol/L (ref 135–145)
TCO2: 19 mmol/L — ABNORMAL LOW (ref 22–32)
pCO2 arterial: 31.3 mmHg — ABNORMAL LOW (ref 32.0–48.0)
pH, Arterial: 7.36 (ref 7.350–7.450)
pO2, Arterial: 101 mmHg (ref 83.0–108.0)

## 2020-06-22 LAB — URINALYSIS, ROUTINE W REFLEX MICROSCOPIC
Bilirubin Urine: NEGATIVE
Glucose, UA: >=500 mg/dL — AB
Ketones, ur: NEGATIVE mg/dL
Leukocytes,Ua: NEGATIVE
Nitrite: NEGATIVE
Protein, ur: >=300 mg/dL — AB
RBC / HPF: >50 RBC/hpf — ABNORMAL HIGH (ref 0–5)
Specific Gravity, Urine: 1.015 (ref 1.005–1.030)
pH: 6 (ref 5.0–8.0)

## 2020-06-22 LAB — C-REACTIVE PROTEIN: CRP: 9.2 mg/dL — ABNORMAL HIGH (ref <1.0)

## 2020-06-22 LAB — ETHANOL: Alcohol, Ethyl (B): <10 mg/dL (ref <10)

## 2020-06-22 LAB — T4, FREE: Free T4: 1.06 ng/dL (ref 0.61–1.12)

## 2020-06-22 LAB — ECHOCARDIOGRAM COMPLETE BUBBLE STUDY: S' Lateral: 2.7 cm

## 2020-06-22 LAB — RAPID URINE DRUG SCREEN, HOSP PERFORMED
Amphetamines: NOT DETECTED
Barbiturates: NOT DETECTED
Benzodiazepines: POSITIVE — AB
Cocaine: NOT DETECTED
Opiates: NOT DETECTED
Tetrahydrocannabinol: NOT DETECTED

## 2020-06-22 LAB — CORTISOL: Cortisol, Plasma: 22.7 ug/dL

## 2020-06-22 LAB — SEDIMENTATION RATE: Sed Rate: 2 mm/hr (ref 0–22)

## 2020-06-22 LAB — LACTIC ACID, PLASMA
Lactic Acid, Venous: 10.8 mmol/L (ref 0.5–1.9)
Lactic Acid, Venous: 5.8 mmol/L (ref 0.5–1.9)

## 2020-06-22 LAB — PROTIME-INR
INR: 4.7 (ref 0.8–1.2)
Prothrombin Time: 42.6 seconds — ABNORMAL HIGH (ref 11.4–15.2)

## 2020-06-22 LAB — TROPONIN I (HIGH SENSITIVITY)
Troponin I (High Sensitivity): 34 ng/L — ABNORMAL HIGH (ref <18)
Troponin I (High Sensitivity): 125 ng/L (ref <18)

## 2020-06-22 LAB — ACETAMINOPHEN LEVEL: Acetaminophen (Tylenol), Serum: <10 ug/mL — ABNORMAL LOW (ref 10–30)

## 2020-06-22 LAB — AMMONIA: Ammonia: 157 umol/L — ABNORMAL HIGH (ref 9–35)

## 2020-06-22 LAB — OSMOLALITY, URINE: Osmolality, Ur: 369 mOsm/kg (ref 300–900)

## 2020-06-22 LAB — TSH: TSH: 10.488 u[IU]/mL — ABNORMAL HIGH (ref 0.350–4.500)

## 2020-06-22 LAB — SARS CORONAVIRUS 2 BY RT PCR (HOSPITAL ORDER, PERFORMED IN ~~LOC~~ HOSPITAL LAB): SARS Coronavirus 2: NEGATIVE

## 2020-06-22 LAB — COOXEMETRY PANEL
Carboxyhemoglobin: 1.5 % (ref 0.5–1.5)
Methemoglobin: 0.9 % (ref 0.0–1.5)
O2 Saturation: 59.3 %
Total hemoglobin: 13.7 g/dL (ref 12.0–16.0)

## 2020-06-22 LAB — PHOSPHORUS: Phosphorus: 5.7 mg/dL — ABNORMAL HIGH (ref 2.5–4.6)

## 2020-06-22 LAB — SALICYLATE LEVEL: Salicylate Lvl: <7 mg/dL — ABNORMAL LOW (ref 7.0–30.0)

## 2020-06-22 LAB — MAGNESIUM: Magnesium: 1.5 mg/dL — ABNORMAL LOW (ref 1.7–2.4)

## 2020-06-22 MED ORDER — SODIUM BICARBONATE 8.4 % IV SOLN
INTRAVENOUS | Status: DC
  Filled 2020-06-22 (×5): qty 850

## 2020-06-22 MED ORDER — SODIUM BICARBONATE 8.4 % IV SOLN
100.0000 meq | Freq: Once | INTRAVENOUS | Status: AC
  Administered 2020-06-22: 100 meq via INTRAVENOUS

## 2020-06-22 MED ORDER — LACTULOSE 10 GM/15ML PO SOLN
30.0000 g | Freq: Three times a day (TID) | ORAL | Status: DC
  Administered 2020-06-22 – 2020-06-27 (×14): 30 g
  Filled 2020-06-22 (×16): qty 45

## 2020-06-22 MED ORDER — ORAL CARE MOUTH RINSE
15.0000 mL | OROMUCOSAL | Status: DC
  Administered 2020-06-22 – 2020-07-26 (×317): 15 mL via OROMUCOSAL

## 2020-06-22 MED ORDER — DEXTROSE 10 % IV SOLN: INTRAVENOUS | Status: DC

## 2020-06-22 MED ORDER — CHLORHEXIDINE GLUCONATE 0.12% ORAL RINSE (MEDLINE KIT)
15.0000 mL | Freq: Two times a day (BID) | OROMUCOSAL | Status: DC
  Administered 2020-06-22 – 2020-07-26 (×66): 15 mL via OROMUCOSAL
  Filled 2020-06-22: qty 15

## 2020-06-22 MED ORDER — PANTOPRAZOLE SODIUM 40 MG IV SOLR
40.0000 mg | Freq: Every day | INTRAVENOUS | Status: DC
  Administered 2020-06-22: 40 mg via INTRAVENOUS
  Filled 2020-06-22 (×2): qty 40

## 2020-06-22 MED ORDER — DEXTROSE 50 % IV SOLN: 1.0000 | Freq: Once | INTRAVENOUS | Status: AC

## 2020-06-22 MED ORDER — MIDAZOLAM HCL 2 MG/2ML IJ SOLN
INTRAMUSCULAR | Status: AC
  Administered 2020-06-22: 2 mg via INTRAVENOUS
  Filled 2020-06-22: qty 2

## 2020-06-22 MED ORDER — DEXTROSE 50 % IV SOLN: 50.0000 mL | Freq: Once | INTRAVENOUS | Status: AC

## 2020-06-22 MED ORDER — POLYETHYLENE GLYCOL 3350 17 G PO PACK: 17.0000 g | PACK | Freq: Every day | ORAL | Status: DC | PRN

## 2020-06-22 MED ORDER — SODIUM CHLORIDE 0.9 % IV BOLUS
2000.0000 mL | Freq: Once | INTRAVENOUS | Status: AC
  Administered 2020-06-22: 2000 mL via INTRAVENOUS

## 2020-06-22 MED ORDER — FENTANYL 2500MCG IN NS 250ML (10MCG/ML) PREMIX INFUSION
50.0000 ug/h | INTRAVENOUS | Status: DC
  Administered 2020-06-22: 100 ug/h via INTRAVENOUS
  Administered 2020-06-23: 50 ug/h via INTRAVENOUS
  Administered 2020-06-24 – 2020-06-25 (×2): 100 ug/h via INTRAVENOUS
  Administered 2020-06-26: 150 ug/h via INTRAVENOUS
  Administered 2020-06-27 – 2020-06-30 (×7): 200 ug/h via INTRAVENOUS
  Administered 2020-06-30 – 2020-07-01 (×2): 150 ug/h via INTRAVENOUS
  Administered 2020-07-02: 125 ug/h via INTRAVENOUS
  Filled 2020-06-22 (×16): qty 250

## 2020-06-22 MED ORDER — PIPERACILLIN-TAZOBACTAM 3.375 G IVPB 30 MIN
3.3750 g | Freq: Once | INTRAVENOUS | Status: AC
  Administered 2020-06-22: 3.375 g via INTRAVENOUS
  Filled 2020-06-22: qty 50

## 2020-06-22 MED ORDER — FUROSEMIDE 10 MG/ML IJ SOLN
80.0000 mg | Freq: Two times a day (BID) | INTRAMUSCULAR | Status: DC
  Administered 2020-06-22 – 2020-06-24 (×4): 80 mg via INTRAVENOUS
  Filled 2020-06-22 (×4): qty 8

## 2020-06-22 MED ORDER — DEXTROSE 20 % IV SOLN
INTRAVENOUS | Status: DC
  Filled 2020-06-22 (×4): qty 500

## 2020-06-22 MED ORDER — HYDROCORTISONE NA SUCCINATE PF 100 MG IJ SOLR: 100.0000 mg | Freq: Three times a day (TID) | INTRAMUSCULAR | Status: DC

## 2020-06-22 MED ORDER — METHYLPREDNISOLONE SODIUM SUCC 125 MG IJ SOLR
125.0000 mg | Freq: Once | INTRAMUSCULAR | Status: AC
  Administered 2020-06-22: 125 mg via INTRAVENOUS
  Filled 2020-06-22: qty 2

## 2020-06-22 MED ORDER — FENTANYL CITRATE (PF) 100 MCG/2ML IJ SOLN
50.0000 ug | Freq: Once | INTRAMUSCULAR | Status: AC
  Administered 2020-06-22: 50 ug via INTRAVENOUS

## 2020-06-22 MED ORDER — DIGOXIN 125 MCG PO TABS
0.1250 mg | ORAL_TABLET | Freq: Every day | ORAL | Status: DC
  Administered 2020-06-23 – 2020-06-27 (×5): 0.125 mg
  Filled 2020-06-22 (×5): qty 1

## 2020-06-22 MED ORDER — IOHEXOL 350 MG/ML SOLN
60.0000 mL | Freq: Once | INTRAVENOUS | Status: AC | PRN
  Administered 2020-06-22: 60 mL via INTRAVENOUS

## 2020-06-22 MED ORDER — DOCUSATE SODIUM 50 MG/5ML PO LIQD: 100.0000 mg | Freq: Two times a day (BID) | ORAL | Status: DC | PRN

## 2020-06-22 MED ORDER — FENTANYL BOLUS VIA INFUSION
50.0000 ug | INTRAVENOUS | Status: DC | PRN
  Administered 2020-06-27 (×4): 50 ug via INTRAVENOUS
  Filled 2020-06-22: qty 50

## 2020-06-22 MED ORDER — MILRINONE LACTATE IN DEXTROSE 20-5 MG/100ML-% IV SOLN
0.1250 ug/kg/min | INTRAVENOUS | Status: DC
  Administered 2020-06-22 – 2020-06-24 (×2): 0.125 ug/kg/min via INTRAVENOUS
  Filled 2020-06-22 (×2): qty 100

## 2020-06-22 MED ORDER — MIDAZOLAM HCL 2 MG/2ML IJ SOLN
2.0000 mg | INTRAMUSCULAR | Status: AC | PRN
  Administered 2020-06-23 – 2020-06-29 (×3): 2 mg via INTRAVENOUS
  Filled 2020-06-22 (×7): qty 2

## 2020-06-22 MED ORDER — DIGOXIN 125 MCG PO TABS
0.1250 mg | ORAL_TABLET | Freq: Every day | ORAL | Status: DC
  Filled 2020-06-22: qty 1

## 2020-06-22 MED ORDER — DEXTROSE 50 % IV SOLN
INTRAVENOUS | Status: AC
  Administered 2020-06-22: 50 mL via INTRAVENOUS
  Filled 2020-06-22: qty 50

## 2020-06-22 MED ORDER — DOCUSATE SODIUM 100 MG PO CAPS: 100.0000 mg | ORAL_CAPSULE | Freq: Two times a day (BID) | ORAL | Status: DC | PRN

## 2020-06-22 MED ORDER — IOHEXOL 350 MG/ML SOLN
80.0000 mL | Freq: Once | INTRAVENOUS | Status: AC | PRN
  Administered 2020-06-22: 80 mL via INTRAVENOUS

## 2020-06-22 MED ORDER — MIDAZOLAM HCL 2 MG/2ML IJ SOLN
2.0000 mg | INTRAMUSCULAR | Status: DC | PRN
  Administered 2020-06-23 – 2020-07-11 (×24): 2 mg via INTRAVENOUS
  Filled 2020-06-22 (×20): qty 2

## 2020-06-22 MED ORDER — SODIUM CHLORIDE 0.9 % IV SOLN
1000.0000 mg | INTRAVENOUS | Status: AC
  Administered 2020-06-22 – 2020-06-24 (×3): 1000 mg via INTRAVENOUS
  Filled 2020-06-22 (×3): qty 8

## 2020-06-22 MED ORDER — VANCOMYCIN HCL 1250 MG/250ML IV SOLN
1250.0000 mg | Freq: Once | INTRAVENOUS | Status: AC
  Administered 2020-06-22: 1250 mg via INTRAVENOUS
  Filled 2020-06-22: qty 250

## 2020-06-22 MED ORDER — DEXTROSE 50 % IV SOLN
INTRAVENOUS | Status: AC
  Administered 2020-06-22: 50 mL via INTRAVENOUS
  Filled 2020-06-22: qty 100

## 2020-06-22 MED FILL — Medication: Qty: 1 | Status: AC

## 2020-06-22 NOTE — ED Notes (Signed)
Amp D50 given

## 2020-06-22 NOTE — Progress Notes (Signed)
EEG complete - results pending 

## 2020-06-22 NOTE — ED Notes (Signed)
100mg   Rocuronium at this time R IV

## 2020-06-22 NOTE — Progress Notes (Signed)
Assisted with transport to [facility].

## 2020-06-22 NOTE — H&P (Addendum)
NAME:  Carolyn Proctor, MRN:  [id number], DOB:  08/14/1961, LOS: 0 ADMISSION DATE:  06/22/2020, CONSULTATION DATE:  06/22/2020 REFERRING MD:  Dr. Tyrone Nine, CHIEF COMPLAINT:  AMS/ refractory hypoglycemia  Brief History   35 yoF at home who complained to family she felt like she was having a stroke, then developed left facial droop and went unresponsive.  Required BVM by EMS and noted to be hypoglycemic and hypotensive.  Intubated in ER for airway protection.  CTH negative.  Refractory hypoglycemia despite multiple D50 amps now on drip.  PCCM called to admit.   History of present illness   HPI obtained from medical chart review and daughter, [name] at beside, as patient is currently intubated and sedated on mechanical ventilation.  59 year old female with prior history of PAF, DVT, and PE on Eliquis, RA,  hypercoagulable state (increased protein S antigen, decreased protein C activity, and decreased Antithrombin III), HFpEF, CKD stage III, centrilobular emphysema and pulmonary fibrosis on daily prednisone- never smoker, chronic venous insufficiency w/ chronic foot ulcers, and diverticulitis.   Daughter reports patient has not felt well since her hospitalization in June at [facility] where she was diagnosed with PE/ DVT and placed on Eliquis in which daughter states she is compliant with.  Since, patient also has been struggling with weight loss, early satiety, muscle weakness, and ongoing lower leg edema/ wounds.  Seen by GI and put on miralax per daughter for constipation.  Daughter also reports her prednisone has been recently tapered.    Patient was at home this morning around 1020, when she reported to her family that she felt like she was having a stroke. Family noticed a left facial droop and then patient proceeded to become unresponsive.  On EMS arrival, patient required continuous BVM. CBG noted 49, afebrile and was hypotensive requiring fluid bolus.  On arrival to ER, she remained altered  and required intubation for airway protection. 2L NS bolus given for hypotension.  She remained hypoglycemic despite multiple D50 requiring D10 gtt.  A right IJ central line was placed in ER. Labs noted for normal WBC/ Hgb, Na 132, CO2 10, sCr 1.38, AG 19, albumin 2.2, normal AST/ ALT, t. Bili 2.9, trop hs 34, LA 10.8, ETOH neg, ammonia 157. CT head without acute process.  CXR showing satisfactory position of ETT, layering right pleural effusion, diffuse interstitial thickening c/w hx of fibrosis.  ABG post intubation 6.954/ 43.4/ 31.3/ 9.6.  Patient remained normotensive but temp 95 requiring bairhugger.  PCCM called for admit.   Past Medical History  PAF, DVT, PE on Eliquis HFpEF CKD stage III RA Hypercoagulable state (increased protein S antigen, decreased protein C activity, and decreased Antithrombin III) Centrilobular emphysema and pulmonary fibrosis- non smoker Chronic venous insufficiency w/ chronic foot ulcers  Diverticulitis   Significant Hospital Events   9/15 admitted/ intubated  Consults:   Procedures:  9/15 ETT >> 9/15 R IJ TL CVC >> 9/15 OGT/ foley  Significant Diagnostic Tests:  9/15 CTH >> negative  9/15 CXR >> ETT 5.3 above [place]ina, layering right pleural effusion, diffuse interstitial thickening c/w hx of fibrosis  9/15 CTA head/ neck/ chest/ abd/ pelvis >> 9/15 TTE >>  Micro Data:  9/15 BCx2 >> 9/15 SARS2 >> neg 9/15 UC >>  Antimicrobials:  9/15 vanc >> 9/15 zosyn >>  Interim history/subjective:  D10 gtt at 250 ml/hr to maintain glucose level  Objective   Temperature 97.8 F (36.6 C), temperature source Temporal, resp. rate (!) 36,  height '5\' 11"'  (1.803 m), last menstrual period 02/19/2013, SpO2 100 %.    Vent Mode: PRVC FiO2 (%):  [100 %] 100 % Set Rate:  [16 bmp] 16 bmp Vt Set:  [570 mL] 570 mL PEEP:  [5 cmH20] 5 cmH20  No intake or output data in the 24 hours ending 06/22/20 1303 There were no vitals filed for this  visit.  Examination: General:  Critically ill/ thin adult female intubated and sedated on mechanical ventilation HEENT: MM pale/ dry, ETT, OGT, pupils 3/sluggish Neuro: sedated/ non responsive CV: RR, + murmur PULM:  MV supported breaths, coarse, bibasilar rales GI: firm/ distended,  no BS Extremities: cool/dry, +2-3 LE edema, RLE with ecchymosis  Skin: no rashes   Resolved Hospital Problem list    Assessment & Plan:   Acute hypoxic respiratory failure  - full MV support, PRVC 6-8 cc/kg - ABG at 1500 post vent changes to maximze MVe, rate 28 - wean FiO2 for sat goal > 90 - VAP bundle/ PPI  - CXR in am   Hypotension - fluid responsive thus far, ddx include sepsis, acute HF, adrenal crisis, dehydration, abd process given abd distention (? Bowel infarction w/ high lactic) - goal map > 65 - trend lactic acid  - blood and urine cultures sent.  Empiric vanc/ zosyn started in ER.   - check PCT trend (given chronic steroids) - trend WBC/ fever curve, bair hugger as needed to maintain normothermia - stress dose steroids (on daily prednisone) - stat TTE (previous TTE 9/20 w/ small pericardial effusion, normal EF, rheumatic MV, and RV dysfunction)  - CTA head/ neck/ chest/ abd/pelvis  Refractory hypoglycemia, unclear etiology, ? Sepsis, acute HF, ? Adrenal component given recent reduction in steroids, hx pre- diabetic but not on meds, ETOH neg - change to D20 gtt with hourly and prn CBG  Encephalopathy, w/ initial stroke symptoms most likely attributed to hypoglycemia but given hypercoagulable hx needs further workup.  CTH neg for acute changes or bleed.  - CTA head/ neck now, consider MRI when stable - EEG ordered - UDS ordered - TSH pending  - ammonia 157 - serial neuro checks  Severe AGMA/ lactic acidosis  CKD stage III,  baseline sCr ~1.15- 1.39, today at 1.38 - foley - bicarb x 2 pushes, then bicarb gtt - recheck BMP at 2100 - strict I/Os, trend renal indices  - checking  serum osm, volatile panel, tylenol and salicylate levels   Hyperammoniemia, unclear etiology given normal LFTs except t.bili 2.9,  - CT abd/ pelvis ordered - pending imaging, will need lactulose - trend LFTs  Hx DVT, PE, and +hypercoagulable workup - checking coags - holding eliquis for now  Hx PAF - currently ST - tele monitoring  - holding home cardizem, eliquis for now   Hx HFpEF, HTN - TTE as above - holding home ASA  ? right layering pleural effusions - CT chest pending as above  Hx RA  - on rituximab pre-admit  Prolonged QT - avoid QTc prolonging agents - tele monitoring  Best practice:  Diet: NPO Pain/Anxiety/Delirium protocol (if indicated): fentanyl/ versed VAP protocol (if indicated): yes DVT prophylaxis:  GI prophylaxis: PPI Glucose control: hourly and prn still stable Mobility: bedrest Code Status: full  Family Communication: Daughter, [name] [phone], updated at bedside Disposition: ICU  Labs   CBC: Recent Labs  Lab 06/22/20 1139 06/22/20 1140 06/22/20 1235  WBC 9.3  --   --   NEUTROABS 7.4  --   --  HGB 13.2 16.7* 15.0  HCT 46.3* 49.0* 44.0  MCV 111.0*  --   --   PLT 248  --   --     Basic Metabolic Panel: Recent Labs  Lab 06/22/20 1140 06/22/20 1235  NA 133* 132*  K 4.7 3.7  CL 105  --   GLUCOSE 134*  --   BUN 19  --   CREATININE 1.10*  --    GFR: CrCl cannot be calculated (Unknown ideal weight.). Recent Labs  Lab 06/22/20 1139 06/22/20 1147  WBC 9.3  --   LATICACIDVEN  --  10.8*    Liver Function Tests: No results for input(s): AST, ALT, ALKPHOS, BILITOT, PROT, ALBUMIN in the last 168 hours. No results for input(s): LIPASE, AMYLASE in the last 168 hours. Recent Labs  Lab 06/22/20 1147  AMMONIA 157*    ABG    Component Value Date/Time   PHART 6.954 (LL) 06/22/2020 1235   PCO2ART 43.4 06/22/2020 1235   PO2ART 313 (H) 06/22/2020 1235   HCO3 9.6 (L) 06/22/2020 1235   TCO2 11 (L) 06/22/2020 1235    ACIDBASEDEF 22.0 (H) 06/22/2020 1235   O2SAT 100.0 06/22/2020 1235     Coagulation Profile: No results for input(s): INR, PROTIME in the last 168 hours.  Cardiac Enzymes: No results for input(s): CKTOTAL, CKMB, CKMBINDEX, TROPONINI in the last 168 hours.  HbA1C: Hgb A1c MFr Bld  Date/Time Value Ref Range Status  07/17/2007 02:46 PM   Final   5.3 (NOTE)   The ADA recommends the following therapeutic goals for glycemic   control related to Hgb A1C measurement:   Goal of Therapy:   < 7.0% Hgb A1C   Action Suggested:  > 8.0% Hgb A1C   Ref:  Diabetes Care, 22, Suppl. 1, 1999    CBG: Recent Labs  Lab 06/22/20 1143 06/22/20 1206 06/22/20 1236 06/22/20 1244 06/22/20 1250  GLUCAP 201* 134* <10* 50* 31*    Review of Systems:   Unable   Past Medical History  She,  has a past medical history of Acute myopericarditis (02/24/2013), Atrial fibrillation ([place]), Atrial fibrillation with RVR ([place]) (02/22/2013), Chronic anticoagulation, Xarelto (04/02/2013), Chronic diastolic CHF (congestive heart failure) ([place]) (06/26/2019), Diverticulitis, Elevated troponin- secondary to Myopericarditis (02/25/2013), Emphysema  (07/07/2019), Hypertension, Lupus ([place]) (09/14/2011), 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   Surgical History    Past Surgical History:  Procedure Laterality Date  . BONE MARROW BIOPSY  2010  . CARDIAC CATHETERIZATION    . LEFT HEART CATHETERIZATION WITH CORONARY ANGIOGRAM N/A 02/24/2013   Procedure: LEFT HEART CATHETERIZATION WITH CORONARY ANGIOGRAM;  Surgeon: Leonie Man, MD;  Location[facility] CATH  LAB;  Service: Cardiovascular;  Laterality: N/A;  . RIGHT/LEFT HEART CATH AND CORONARY ANGIOGRAPHY N/A 09/09/2019   Procedure: RIGHT/LEFT HEART CATH AND CORONARY ANGIOGRAPHY;  Surgeon: Jettie Booze, MD;  Location: [place] CV LAB;  Service: Cardiovascular;  Laterality: N/A;     Social History   reports that she has never smoked. She has never used smokeless tobacco. She reports that she does not drink alcohol and does not use drugs.   Family History   Her family history includes Hypertension in her maternal grandmother; Liver disease in her  mother; Lupus in her brother and mother.   Allergies No Known Allergies   Home Medications  Prior to Admission medications   Medication Sig Start Date End Date Taking? Authorizing Provider  albuterol (VENTOLIN HFA) 108 (90 Base) MCG/ACT inhaler Inhale 2 puffs into the lungs every 6 (six) hours as needed for wheezing or shortness of breath. 06/26/19   [place] Donnamarie Poag, DO  Calcium Carbonate-Vit D-Min (CALCIUM 1200 PO) Take 1 capsule by mouth daily.    [provider]  Cholecalciferol (VITAMIN D) 50 MCG (2000 UT) tablet Take 2,000 Units by mouth daily.     [provider]  Cyanocobalamin (VITAMIN B-12) 1000 MCG SUBL Place 1,000 mcg under the tongue.    [provider]  furosemide (LASIX) 20 MG tablet Take 1 tablet (20 mg total) by mouth 3 (three) times a week. 09/23/19   Jettie Booze, MD  pantoprazole (PROTONIX) 40 MG tablet Take 1 tablet (40 mg total) by mouth daily. 09/28/19 12/27/19  Margaretha Seeds, MD  predniSONE ([place]) 5 MG tablet TAKE 2 TABLETS BY MOUTH EVERY DAY 01/18/20   Margaretha Seeds, MD     Critical care time: 52 mins     Kennieth Rad, MSN, AGACNP-BC [facility] 06/22/2020, 3:13 PM  See Amion for personal pager PCCM on call pager [phone]

## 2020-06-22 NOTE — ED Triage Notes (Signed)
LSN 2- family noticed droop. Breathing but assisted with bagging. Eyes fixed. Hx of clots

## 2020-06-22 NOTE — ED Notes (Signed)
Pt's CBG result was 50. Informed Lynnze - RN.

## 2020-06-22 NOTE — ED Notes (Signed)
Pt's CBG result was 116. Informed Lynnze - RN.

## 2020-06-22 NOTE — ED Notes (Signed)
Bair hugger applied   Critcal care at bedside.

## 2020-06-22 NOTE — ED Notes (Signed)
Dr. Tyrone Nine made aware of pts lactic acid.

## 2020-06-22 NOTE — ED Notes (Signed)
20 etomidate pushed R IV at this time.

## 2020-06-22 NOTE — ED Notes (Signed)
Family at bedside. 

## 2020-06-22 NOTE — Progress Notes (Signed)
MD notified of abnormal ABG.

## 2020-06-22 NOTE — ED Notes (Signed)
Pt's CBG result was "LO". Informed Lynnze - RN.

## 2020-06-22 NOTE — Consult Note (Signed)
Advanced Heart Failure Team Consult Note   Primary Physician: Lilian Coma., MD PCP-Cardiologist:  [place]  Reason for Consultation: RV failure/cardiogenic shock  HPI:    Carolyn Proctor is seen today for evaluation of RV failure/cardiogenic shock at the request of Dr. Ruthann Cancer.   Carolyn Proctor is a 59 y/o woman with complicated PMHx including RA-ILD, lupus, paroxysmal atrial fibrillation, PAH with RV dysfunction.   Developed myopericarditis in 2014. EF transiently 40-45% in 5/14. F/u echo 5/14 with EF 60-65% and normal RV.    Hospitalized for diastolic HF in 1/61. Echo at that time LVEF 60-65% RV moderately HK. Rheumatic appearing MV. Moderate TR.  CT chest 9/20: Significant fibrotic changes within the lungs bilaterally progressed in the interval from the prior exam of 2014. Some superimposed edema is noted as well. No focal confluent infiltrate is seen. No PE  Underwent R/L cath 12/20. LVEF 55-65%. Normal cors.  RA 2 RV 56/5 PA 56/17 (35) PCW 12 Fick 5.2/2.8 PVR 4.4 PAPi  19  Followed by Pulmonary. Had televisit with Dr. Loanne Drilling 12/20. Was asymptomatic. Was on rituxan for RA. Not requiring her O2.   PFTs 1/21  FEV1 1.65L (61%) FVC   2.07L  (59%) DLCO 26%  Admitted to [facility] in 6/21 and found to have DVT/PE (increased protein S antigen, decreased protein C activity, and decreased Antithrombin III). Started on Eliquis. Since, d/c has been struggling with weight loss, early satiety, muscle weakness, and ongoing lower leg edema/ wounds.  Admitted today with acute respiratory failure and LOC with refractory hypoglycemia and acidosis. Intubated. ABG post intubation 6.954/ 43.4/ 31.3/ 9.6.  Patient remained normotensive but temp 95 requiring bairhugger. Ammonia 157.  UA +++blood  Echo LV small compressed from RV EF 45-50% RV massively dilated and severely hypokinetic. Severe TR RVSP 76 Severe D-sign. Moderate pericardial effusion   Chest CT 06/22/20 1. No PE 2.  Significant cardiac enlargement primarily involving right-sided chambers with massive dilatation of the right atrium and significant right ventricular dilatation. Associated prominent reflux of contrast into the intrahepatic IVC and hepatic veins is consistent with right heart failure and elevated right heart pressures. 3. Moderate right and small left pleural effusions with associated atelectasis of both lower lobes, right greater than left. 4. Underlying chronic cystic and fibrotic lung disease with probable component of pulmonary edema. 5. Anasarca 6. Cirrhosis    . Review of Systems: Unavailable due to intubation   Home Medications Prior to Admission medications   Medication Sig Start Date End Date Taking? Authorizing Provider  albuterol (VENTOLIN HFA) 108 (90 Base) MCG/ACT inhaler Inhale 2 puffs into the lungs every 6 (six) hours as needed for wheezing or shortness of breath. 06/26/19   [place] Donnamarie Poag, DO  aspirin EC 81 MG tablet Take 81 mg by mouth daily. 03/29/20   [provider]  Calcium Carbonate-Vit D-Min (CALCIUM 1200 PO) Take 1 capsule by mouth daily.    [provider]  Cholecalciferol (VITAMIN D) 50 MCG (2000 UT) tablet Take 2,000 Units by mouth daily.     [provider]  Cyanocobalamin (VITAMIN B-12) 1000 MCG SUBL Place 1,000 mcg under the tongue.    [provider]  diltiazem (CARDIZEM CD) 240 MG 24 hr capsule Take 240 mg by mouth daily. 06/02/20   [provider]  diltiazem (CARDIZEM) 30 MG tablet Take 30 mg by mouth in the morning and at bedtime. 03/29/20   [provider]  doxycycline (VIBRA-TABS) 100 MG tablet Take 100  mg by mouth 2 (two) times daily. For ten days 02/25/20   [provider]  ELIQUIS 5 MG TABS tablet Take 5 mg by mouth 2 (two) times daily. 06/06/20   [provider]  fluticasone (FLONASE) 50 MCG/ACT nasal spray Place 1 spray into both nostrils daily. 03/22/20   [provider]    furosemide (LASIX) 20 MG tablet Take 1 tablet (20 mg total) by mouth 3 (three) times a week. 09/23/19   Jettie Booze, MD  gabapentin (NEURONTIN) 300 MG capsule Take 300 mg by mouth 2 (two) times daily. 03/01/20   [provider]  pantoprazole (PROTONIX) 40 MG tablet Take 1 tablet (40 mg total) by mouth daily. 09/28/19 12/27/19  Margaretha Seeds, MD  predniSONE (Vantage) 5 MG tablet TAKE 2 TABLETS BY MOUTH EVERY DAY Patient taking differently: Take 10 mg by mouth daily with breakfast. TAKE 2 TABLETS BY MOUTH EVERY DAY 01/18/20   Margaretha Seeds, MD  promethazine (PHENERGAN) 25 MG tablet Take 25 mg by mouth every 6 (six) hours as needed for nausea/vomiting. 04/21/20   [provider]  traMADol (ULTRAM) 50 MG tablet Take 50 mg by mouth every 6 (six) hours as needed for pain. 03/30/20   [provider]    Past Medical History: Past Medical History:  Diagnosis Date  . Acute myopericarditis 02/24/2013  . Atrial fibrillation ([place])   . Atrial fibrillation with RVR ([place]) 02/22/2013  . Chronic anticoagulation, Xarelto 04/02/2013  . Chronic diastolic CHF (congestive heart failure) ([place]) 06/26/2019  . Diverticulitis   . Elevated troponin- secondary to Myopericarditis 02/25/2013  . Emphysema  07/07/2019  . Hypertension   . Lupus ([place]) 09/14/2011  . 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 PAF (paroxysmal atrial fibrillation) ([place]) 04/02/2013  . Pleuritic chest pain 02/22/2013  . 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 Rheumatoid arthritis ([place]) 07/07/2019  . 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 Right ventricular dysfunction 08/05/2019  . Thrombocytosis ([place]) 09/14/2011    Past Surgical History: Past Surgical History:  Procedure Laterality Date  . BONE MARROW BIOPSY  2010  . CARDIAC CATHETERIZATION    . LEFT HEART  CATHETERIZATION WITH CORONARY ANGIOGRAM N/A 02/24/2013   Procedure: LEFT HEART CATHETERIZATION WITH CORONARY ANGIOGRAM;  Surgeon: Leonie Man, MD;  Location: [facility] CATH LAB;  Service: Cardiovascular;  Laterality: N/A;  . RIGHT/LEFT HEART CATH AND CORONARY ANGIOGRAPHY N/A 09/09/2019   Procedure: RIGHT/LEFT HEART CATH AND CORONARY ANGIOGRAPHY;  Surgeon: Jettie Booze, MD;  Location: [facility] CV LAB;  Service: Cardiovascular;  Laterality: N/A;    Family History: Family History  Problem Relation Age of Onset  . Lupus Mother   . Liver disease Mother   . Hypertension Maternal Grandmother   . Lupus Brother     Social History: Social History   Socioeconomic History  . Marital status: Divorced    Spouse name: Not on file  . Number of children: Not on file  . Years of education: Not on file  . Highest education level: Not on file  Occupational History  . Not on file  Tobacco Use  . Smoking status: Never Smoker  . Smokeless tobacco: Never Used  Substance and Sexual Activity  . Alcohol use: No  . Drug use: No  . Sexual activity: Never  Other Topics Concern  .  Not on file  Social History Narrative  . Not on file   Social Determinants of Health   Financial Resource Strain:   . Difficulty of Paying Living Expenses: Not on file  Food Insecurity:   . Worried About Charity fundraiser in the Last Year: Not on file  . Ran Out of Food in the Last Year: Not on file  Transportation Needs:   . Lack of Transportation (Medical): Not on file  . Lack of Transportation (Non-Medical): Not on file  Physical Activity:   . Days of Exercise per Week: Not on file  . Minutes of Exercise per Session: Not on file  Stress:   . Feeling of Stress : Not on file  Social Connections:   . Frequency of Communication with Friends and Family: Not on file  . Frequency of Social Gatherings with Friends and Family: Not on file  . Attends Religious Services: Not on file  . Active Member of Clubs or  Organizations: Not on file  . Attends Archivist Meetings: Not on file  . Marital Status: Not on file    Allergies:  No Known Allergies  Objective:    Vital Signs:   Temp:  [94.4 F (34.7 C)-97.9 F (36.6 C)] 97.9 F (36.6 C) (09/15 1900) Pulse Rate:  [107-163] 110 (09/15 1900) Resp:  [15-36] 28 (09/15 1900) BP: (94-149)/(71-119) 107/81 (09/15 1845) SpO2:  [92 %-100 %] 100 % (09/15 1900) FiO2 (%):  [70 %-100 %] 70 % (09/15 1602) Weight:  [65.3 kg] 65.3 kg (09/15 1900)    Weight change: Filed Weights   06/22/20 1900  Weight: 65.3 kg    Intake/Output:   Intake/Output Summary (Last 24 hours) at 06/22/2020 1910 Last data filed at 06/22/2020 1700 Gross per 24 hour  Intake 2641.92 ml  Output 120 ml  Net 2521.92 ml      Physical Exam    General:  Awake on vent. Follows commands HEENT: normal Neck: supple. JVP to ear . Carotids 2+ bilat; no bruits. No lymphadenopathy or thyromegaly appreciated. Cor: PMI nondisplaced. Regular tachy. +RV lift 2/6 TR Lungs: + crackles/rhonchi Abdomen: soft, nontender, +distended. . No bruits or masses. Good bowel sounds. Extremities: no cyanosis, clubbing, rash, 3+ edema + wounds Neuro: awake on vent follows commands   Telemetry   Sinus tach 110-120s Personally reviewed   EKG    Sinus tach 152 low volts Personally reviewed   Labs   Basic Metabolic Panel: Recent Labs  Lab 06/22/20 1139 06/22/20 1140 06/22/20 1235 06/22/20 1358 06/22/20 1508  NA 132* 133* 132*  --  133*  K 4.7 4.7 3.7  --  3.4*  CL 103 105  --   --   --   CO2 10*  --   --   --   --   GLUCOSE 146* 134*  --   --   --   BUN 16 19  --   --   --   CREATININE 1.38* 1.10*  --   --   --   CALCIUM 7.9*  --   --   --   --   MG  --   --   --  1.5*  --   PHOS  --   --   --  5.7*  --     Liver Function Tests: Recent Labs  Lab 06/22/20 1139  AST 37  ALT 15  ALKPHOS 112  BILITOT 2.9*  PROT 6.9  ALBUMIN 2.2*   No  results for input(s): LIPASE,  AMYLASE in the last 168 hours. Recent Labs  Lab 06/22/20 1147  AMMONIA 157*    CBC: Recent Labs  Lab 06/22/20 1139 06/22/20 1140 06/22/20 1235 06/22/20 1508  WBC 9.3  --   --   --   NEUTROABS 7.4  --   --   --   HGB 13.2 16.7* 15.0 13.3  HCT 46.3* 49.0* 44.0 39.0  MCV 111.0*  --   --   --   PLT 248  --   --   --     Cardiac Enzymes: No results for input(s): CKTOTAL, CKMB, CKMBINDEX, TROPONINI in the last 168 hours.  BNP: BNP (last 3 results) No results for input(s): BNP in the last 8760 hours.  ProBNP (last 3 results) No results for input(s): PROBNP in the last 8760 hours.   CBG: Recent Labs  Lab 06/22/20 1617 06/22/20 1713 06/22/20 1724 06/22/20 1828 06/22/20 1901  GLUCAP 341* 595* 446* 437* 345*    Coagulation Studies: Recent Labs    06/22/20 1358  LABPROT 42.6*  INR 4.7*     Imaging   CT ANGIO HEAD W OR WO CONTRAST  Result Date: 06/22/2020 CLINICAL DATA:  Abdominal pain. Syncope, unresponsive. Hypotension. Hypoglycemia. Rule out dissection EXAM: CT ANGIOGRAPHY HEAD AND NECK TECHNIQUE: Multidetector CT imaging of the head and neck was performed using the standard protocol during bolus administration of intravenous contrast. Multiplanar CT image reconstructions and MIPs were obtained to evaluate the vascular anatomy. Carotid stenosis measurements (when applicable) are obtained utilizing NASCET criteria, using the distal internal carotid diameter as the denominator. CONTRAST:  89m OMNIPAQUE IOHEXOL 350 MG/ML SOLN COMPARISON:  CT head 06/22/2020 FINDINGS: CTA NECK FINDINGS Aortic arch: Limited evaluation of the aortic arch. See separate CT angio chest report from today. Proximal great vessels widely patent. Right carotid system: Right carotid widely patent. Negative for stenosis or dissection. Left carotid system: Left carotid widely patent without stenosis or dissection. Vertebral arteries: Both vertebral arteries widely patent to the basilar without stenosis  or dissection. Skeleton: No acute skeletal abnormality. Other neck: Right jugular central venous catheter. Soft tissue swelling right neck due to hematoma. There is gas in the subcutaneous tissues bilaterally which may be venous gas from prior line placement. There is gas in the central veins. Patient is intubated with endotracheal tube above the carina. Upper chest: Chest CT reported separately Review of the MIP images confirms the above findings CTA HEAD FINDINGS Anterior circulation: Cavernous carotid patent bilaterally without atherosclerotic disease or stenosis. Anterior and middle cerebral arteries are small in caliber diffusely likely due to hypotension. There is mild to moderate stenosis of the right middle cerebral artery. Mild to moderate stenosis superior division of the left middle cerebral artery. No large vessel occlusion or aneurysm. Posterior circulation: Small caliber posterior circulation likely due to hypotension. Both vertebral arteries patent to the basilar. Basilar patent. Superior cerebellar and posterior cerebral arteries are patent. Limited intracranial vascular evaluation due to small caliber vessels and decreased contrast enhancement. Venous sinuses: Minimal venous contrast due none to arterial phase scanning Anatomic variants: None Review of the MIP images confirms the above findings IMPRESSION: 1. No significant carotid or vertebral artery stenosis in the neck 2. Small caliber intracranial circulation anteriorly and posteriorly likely due to hypotension. This limits evaluation. There is mild to moderate stenosis in the middle cerebral arteries bilaterally. No large vessel occlusion or aneurysm identified. 3. Right jugular central venous catheter with probable surrounding hematoma. 4. Gas in  the soft tissues the neck likely venous gas from IV infusion. 5. Endotracheal tube above the carina. Electronically Signed   By: Franchot Gallo M.D.   On: 06/22/2020 16:11   CT Head Wo  Contrast  Result Date: 06/22/2020 CLINICAL DATA:  Unresponsive EXAM: CT HEAD WITHOUT CONTRAST TECHNIQUE: Contiguous axial images were obtained from the base of the skull through the vertex without intravenous contrast. COMPARISON:  01/16/2013 FINDINGS: Brain: No evidence of acute infarction, hemorrhage, hydrocephalus, extra-axial collection or mass lesion/mass effect. Vascular: No hyperdense vessel or unexpected calcification. Skull: Normal. Negative for fracture or focal lesion. Sinuses/Orbits: No acute finding. Other: None. IMPRESSION: No acute intracranial abnormality noted. Electronically Signed   By: Inez Catalina M.D.   On: 06/22/2020 12:42   DG Chest Portable 1 View  Result Date: 06/22/2020 CLINICAL DATA:  Central catheter placement.  Hypoxia. EXAM: PORTABLE CHEST 1 VIEW COMPARISON:  June 22, 2020 study obtained earlier in the day FINDINGS: Central catheter tip is in the superior vena cava. Central catheter tip is 2.7 cm above the carina. Nasogastric tube tip and side port are below the diaphragm. No pneumothorax. There is airspace consolidation in portions of each lower lobe. There is a loculated effusion on the right. There is diffuse interstitial thickening throughout the lungs bilaterally with interstitial and patchy airspace opacity throughout the mid and lower lung regions, slightly increased from earlier in the day. There is cardiomegaly with pulmonary vascularity normal. No adenopathy. No bone lesions. IMPRESSION: Tube and catheter positions as described without pneumothorax. Persistent loculated pleural effusion on the right. Consolidation in each lower lung region, increased. Widespread interstitial and patchy airspace opacity throughout the mid and lower lungs has progressed slightly. Electronically Signed   By: Lowella Grip III M.D.   On: 06/22/2020 13:48   DG Chest Port 1 View  Result Date: 06/22/2020 CLINICAL DATA:  Hypoxia EXAM: PORTABLE CHEST 1 VIEW COMPARISON:  June 23, 2019 FINDINGS: Endotracheal tube tip is 5.3 cm above the carina. Nasogastric tube tip and side port are below the diaphragm. No pneumothorax. There is apparent layering effusion on the right. There is diffuse interstitial thickening with suspected underlying fibrosis. There is no frank edema or airspace opacity. There is cardiomegaly with pulmonary vascularity normal. No adenopathy. No bone lesions. IMPRESSION: Tube positions as described without pneumothorax. Apparent layering effusion on the right. Cardiomegaly. No frank edema or consolidation. Underlying fibrosis present bilaterally. Electronically Signed   By: Lowella Grip III M.D.   On: 06/22/2020 12:16   ECHOCARDIOGRAM COMPLETE BUBBLE STUDY  Result Date: 06/22/2020    ECHOCARDIOGRAM REPORT   Patient Name:   ALAYZA PIEPER Date of Exam: 06/22/2020 Medical Rec #:  [id number]       Height:       71.0 in Accession #:    [id number]      Weight:       143.8 lb Date of Birth:  1961/07/03       BSA:          1.833 m Patient Age:    58 years        BP:           104/85 mmHg Patient Gender: F               HR:           109 bpm. Exam Location:  Inpatient Procedure: 2D Echo and Saline Contrast Bubble Study Indications:    stroke 434.91  History:  Patient has prior history of Echocardiogram examinations, most                 recent 06/23/2020. CHF, lupus; Arrythmias:Atrial Fibrillation.  Sonographer:    Johny Chess Referring Phys: [id number] [place]  1. Left ventricular ejection fraction, by estimation, is 35 to 40%. The left ventricle has moderately decreased function. The left ventricle demonstrates global hypokinesis. Left ventricular diastolic parameters are consistent with Grade II diastolic dysfunction (pseudonormalization). There is the interventricular septum is flattened in systole and diastole, consistent with right ventricular pressure and volume overload.  2. Right ventricular systolic function is severely reduced. The  right ventricular size is severely enlarged. Mildly increased right ventricular wall thickness. There is severely elevated pulmonary artery systolic pressure. The estimated right ventricular systolic pressure is 31.5 mmHg.  3. There is evidence of right to left shunting after 3-4 cardiac cycles suggestive of a PFO. . Evidence of atrial level shunting detected by color flow Doppler.  4. Right atrial size was severely dilated.  5. Moderate pericardial effusion.  6. The mitral valve is normal in structure. Mild mitral valve regurgitation.  7. The tricuspid valve is degenerative. Tricuspid valve regurgitation is severe.  8. The aortic valve is grossly normal. Aortic valve regurgitation is not visualized.  9. The inferior vena cava is dilated in size with <50% respiratory variability, suggesting right atrial pressure of 15 mmHg. FINDINGS  Left Ventricle: Left ventricular ejection fraction, by estimation, is 35 to 40%. The left ventricle has moderately decreased function. The left ventricle demonstrates global hypokinesis. The left ventricular internal cavity size was small. There is no left ventricular hypertrophy. The interventricular septum is flattened in systole and diastole, consistent with right ventricular pressure and volume overload. Left ventricular diastolic parameters are consistent with Grade II diastolic dysfunction (pseudonormalization). Right Ventricle: The right ventricular size is severely enlarged. Mildly increased right ventricular wall thickness. Right ventricular systolic function is severely reduced. There is severely elevated pulmonary artery systolic pressure. The tricuspid regurgitant velocity is 3.88 m/s, and with an assumed right atrial pressure of 15 mmHg, the estimated right ventricular systolic pressure is 40.0 mmHg. Left Atrium: Left atrial size was normal in size. Right Atrium: Right atrial size was severely dilated. Pericardium: A moderately sized pericardial effusion is present. Mitral  Valve: The mitral valve is normal in structure. Mild mitral valve regurgitation. Tricuspid Valve: The tricuspid valve is degenerative in appearance. Tricuspid valve regurgitation is severe. Aortic Valve: The aortic valve is grossly normal. Aortic valve regurgitation is not visualized. Pulmonic Valve: The pulmonic valve was normal in structure. Pulmonic valve regurgitation is trivial. Aorta: The aortic root and ascending aorta are structurally normal, with no evidence of dilitation. Venous: The inferior vena cava is dilated in size with less than 50% respiratory variability, suggesting right atrial pressure of 15 mmHg. IAS/Shunts: Evidence of atrial level shunting detected by color flow Doppler. Agitated saline contrast was given intravenously to evaluate for intracardiac shunting.  LEFT VENTRICLE PLAX 2D LVIDd:         3.40 cm  Diastology LVIDs:         2.70 cm  LV e' medial: 2.72 cm/s LV PW:         1.20 cm LV IVS:        1.20 cm LVOT diam:     1.60 cm LV SV:         14 LV SV Index:   8 LVOT Area:     2.01 cm  RIGHT  VENTRICLE         IVC TAPSE (M-mode): 0.7 cm  IVC diam: 2.50 cm LEFT ATRIUM             Index       RIGHT ATRIUM           Index LA diam:        2.70 cm 1.47 cm/m  RA Area:     26.90 cm LA Vol (A2C):   34.5 ml 18.82 ml/m RA Volume:   93.70 ml  51.13 ml/m LA Vol (A4C):   34.8 ml 18.99 ml/m LA Biplane Vol: 37.4 ml 20.41 ml/m  AORTIC VALVE LVOT Vmax:   49.60 cm/s LVOT Vmean:  37.400 cm/s LVOT VTI:    0.069 m  AORTA Ao Root diam: 3.30 cm Ao Asc diam:  2.90 cm TRICUSPID VALVE TR Peak grad:   60.2 mmHg TR Vmax:        388.00 cm/s  SHUNTS Systemic VTI:  0.07 m Systemic Diam: 1.60 cm Mertie Moores MD Electronically signed by Mertie Moores MD Signature Date/Time: 06/22/2020/5:45:40 PM    Final    CT Angio Chest/Abd/Pel for Dissection W and/or W/WO  Result Date: 06/22/2020 CLINICAL DATA:  Unresponsive with hypotension. Lactic acidosis, elevated troponin and history of prior DVT and pulmonary embolism.  History of rheumatoid arthritis with interstitial lung disease and pulmonary fibrosis. EXAM: CT ANGIOGRAPHY CHEST, ABDOMEN AND PELVIS TECHNIQUE: Non-contrast CT of the chest was initially obtained. Multidetector CT imaging through the chest, abdomen and pelvis was performed using the standard protocol during bolus administration of intravenous contrast. Multiplanar reconstructed images and MIPs were obtained and reviewed to evaluate the vascular anatomy. CONTRAST:  66m OMNIPAQUE IOHEXOL 350 MG/ML SOLN COMPARISON:  CTA of the chest and CT of the abdomen and pelvis on 06/23/2019 FINDINGS: CTA CHEST FINDINGS Cardiovascular: Initial noncontrast CT does contain some contrast due to the fact that a CTA of the head and neck has been performed prior to the body CTA study. The thoracic aorta is of normal caliber and demonstrates no evidence of dissection or significant atherosclerosis. Visualized proximal great vessels demonstrate normal patency. The pulmonary arteries are also well opacified on the study. There is no evidence of acute or chronic pulmonary embolism. Again noted is significant cardiac enlargement primarily involving right-sided chambers with massive dilatation of the right atrium and significant right ventricular dilatation. Associated prominent reflux of contrast into the intrahepatic IVC and hepatic veins is consistent with right heart failure and elevated right heart pressures. There is a trace amount of pericardial fluid. No significant calcified coronary artery plaque identified. Mediastinum/Nodes: Degree of lymph node enlargement involving supraclavicular, subpectoral, paratracheal, prevascular and AP window lymph nodes is similar to the prior study but potentially slightly worse. The largest right paratracheal lymph node measures approximately 1.6 cm in short axis. These are all presumably reactive lymph nodes related to underlying chronic lung disease but underlying lymphoproliferative disease is not  entirely excluded. Endotracheal tube extends to the distal trachea. Gastric decompression tube extends below the diaphragm. Lungs/Pleura: Moderate right and small left pleural effusions present. Associated atelectasis of both lower lobes, right greater than left. Underlying chronic cystic and fibrotic lung disease with probable component of pulmonary edema. No pneumothorax. Musculoskeletal: No chest wall abnormality. No acute or significant osseous findings. Review of the MIP images confirms the above findings. CTA ABDOMEN AND PELVIS FINDINGS VASCULAR Aorta: The abdominal aorta demonstrates normal patency without evidence of aneurysm or dissection. No significant atherosclerosis. Celiac: Normally patent.  Distal branch vessels are patent. SMA: Normally patent. Renals: Normally patent bilateral single renal arteries. IMA: Normally patent. Inflow: Normally patent bilateral common, internal and external iliac arteries. Normally patent bilateral common femoral arteries and femoral bifurcations. Review of the MIP images confirms the above findings. NON-VASCULAR Hepatobiliary: Liver contour is somewhat nodular and there may be a component of underlying cirrhosis. No obvious hepatic masses. The gallbladder is surrounded by some free fluid with ascites also present elsewhere in the peritoneal cavity. Pancreas: Limited evaluation on the arterial phase with no gross abnormality identified. Spleen: Normal in size without focal abnormality. Adrenals/Urinary Tract: Arterial phase demonstrates no gross renal abnormalities. No hydronephrosis. The bladder contains a Foley catheter and is decompressed. Stomach/Bowel: There is suggestion of diffuse bowel wall congestion and edema without obstruction. No free intraperitoneal air identified. Lymphatic: No enlarged lymph nodes identified in the abdomen or pelvis. Reproductive: Uterus and bilateral adnexa are unremarkable. Other: Small volume ascites scattered throughout the peritoneal  cavity. Severe anasarca involving the body wall. No discrete abscess is seen. Musculoskeletal: No acute or significant osseous findings. Review of the MIP images confirms the above findings. IMPRESSION: 1. No evidence of aortic dissection or pulmonary embolism. 2. Significant cardiac enlargement primarily involving right-sided chambers with massive dilatation of the right atrium and significant right ventricular dilatation. Associated prominent reflux of contrast into the intrahepatic IVC and hepatic veins is consistent with right heart failure and elevated right heart pressures. 3. Moderate right and small left pleural effusions with associated atelectasis of both lower lobes, right greater than left. 4. Underlying chronic cystic and fibrotic lung disease with probable component of pulmonary edema. 5. Mediastinal lymphadenopathy which is similar to the prior study but potentially slightly worse compared to the prior study. These are all presumably reactive lymph nodes related to underlying chronic lung disease but underlying lymphoproliferative disease is not entirely excluded. 6. Small volume ascites scattered throughout the peritoneal cavity. 7. Severe anasarca involving the body wall. 8. Suggestion of diffuse bowel wall congestion and edema without obstruction or perforation. 9. Somewhat nodular contour of the liver suggestive of underlying cirrhosis. Electronically Signed   By: Aletta Edouard M.D.   On: 06/22/2020 16:29   CT ANGIO NECK CODE STROKE  Result Date: 06/22/2020 CLINICAL DATA:  Abdominal pain. Syncope, unresponsive. Hypotension. Hypoglycemia. Rule out dissection EXAM: CT ANGIOGRAPHY HEAD AND NECK TECHNIQUE: Multidetector CT imaging of the head and neck was performed using the standard protocol during bolus administration of intravenous contrast. Multiplanar CT image reconstructions and MIPs were obtained to evaluate the vascular anatomy. Carotid stenosis measurements (when applicable) are  obtained utilizing NASCET criteria, using the distal internal carotid diameter as the denominator. CONTRAST:  57m OMNIPAQUE IOHEXOL 350 MG/ML SOLN COMPARISON:  CT head 06/22/2020 FINDINGS: CTA NECK FINDINGS Aortic arch: Limited evaluation of the aortic arch. See separate CT angio chest report from today. Proximal great vessels widely patent. Right carotid system: Right carotid widely patent. Negative for stenosis or dissection. Left carotid system: Left carotid widely patent without stenosis or dissection. Vertebral arteries: Both vertebral arteries widely patent to the basilar without stenosis or dissection. Skeleton: No acute skeletal abnormality. Other neck: Right jugular central venous catheter. Soft tissue swelling right neck due to hematoma. There is gas in the subcutaneous tissues bilaterally which may be venous gas from prior line placement. There is gas in the central veins. Patient is intubated with endotracheal tube above the carina. Upper chest: Chest CT reported separately Review of the MIP images confirms the above  findings CTA HEAD FINDINGS Anterior circulation: Cavernous carotid patent bilaterally without atherosclerotic disease or stenosis. Anterior and middle cerebral arteries are small in caliber diffusely likely due to hypotension. There is mild to moderate stenosis of the right middle cerebral artery. Mild to moderate stenosis superior division of the left middle cerebral artery. No large vessel occlusion or aneurysm. Posterior circulation: Small caliber posterior circulation likely due to hypotension. Both vertebral arteries patent to the basilar. Basilar patent. Superior cerebellar and posterior cerebral arteries are patent. Limited intracranial vascular evaluation due to small caliber vessels and decreased contrast enhancement. Venous sinuses: Minimal venous contrast due none to arterial phase scanning Anatomic variants: None Review of the MIP images confirms the above findings IMPRESSION:  1. No significant carotid or vertebral artery stenosis in the neck 2. Small caliber intracranial circulation anteriorly and posteriorly likely due to hypotension. This limits evaluation. There is mild to moderate stenosis in the middle cerebral arteries bilaterally. No large vessel occlusion or aneurysm identified. 3. Right jugular central venous catheter with probable surrounding hematoma. 4. Gas in the soft tissues the neck likely venous gas from IV infusion. 5. Endotracheal tube above the carina. Electronically Signed   By: Franchot Gallo M.D.   On: 06/22/2020 16:11      Medications:     Current Medications: . chlorhexidine gluconate (MEDLINE KIT)  15 mL Mouth Rinse BID  . digoxin  0.125 mg Oral Daily  . hydrocortisone sod succinate (SOLU-CORTEF) inj  100 mg Intravenous Q8H  . mouth rinse  15 mL Mouth Rinse 10 times per day  . pantoprazole (PROTONIX) IV  40 mg Intravenous Daily     Infusions: . dextrose 50 mL/hr at 06/22/20 1720  . fentaNYL infusion INTRAVENOUS 100 mcg/hr (06/22/20 1206)  . milrinone    . sodium bicarbonate (isotonic) 150 mEq in D5W 1000 mL infusion 125 mL/hr at 06/22/20 1640       Assessment/Plan   1. 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  - RH cath in 12/20 with mild PAH with normal RV function. - Echo 06/22/20 Echo LV small compressed from RV EF 45-50% RV massively dilated and severely hypokinetic. Severe TR RVSP 76 Severe D-sign. Moderate pericardial effusion  - PFTs much better than I expected based on CT chest. 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 - She may also have component of CTEPH with recent PE but CT this admit negative for acute PE. Will eventually need repeat RHC and VQ - Will support RV with milrinone and digoxin. Diurese as tolerated - Once parenchymal lung  disease improves would proceed with cautious trial of iNO - Will do solumedrol 1g burst x 3 days.  - continue bicarb  2. Acute hypoxic respiratory failure - multifactorial including PAH, ILD, large R effusion and possible aspiration  - CCM managing - support RV as above. Consider tap R effusion as needed  3. 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 Will need lactulose via tube  4. Profound hypoglycemia - suspect related to liver failure  5. CTD with h/o RA and SLE - As above, I am concerned about possible CTD flare. Will treat with solumedrol 1 gram burst x 3 days for now - check serology - may need cytoxan   CRITICAL CARE Performed by: Glori Bickers  Total critical care time: 120 minutes  Critical care time was exclusive of separately billable procedures and treating other patients.  Critical care was necessary to treat or prevent imminent or life-threatening deterioration.  Critical care was time spent personally by me (independent of midlevel providers or residents) on the following activities: development of treatment plan with patient and/or surrogate as well as nursing, discussions with consultants, evaluation of patient's response to treatment, examination of patient, obtaining history from patient or surrogate, ordering and performing treatments and interventions, ordering and review of laboratory studies, ordering and review of radiographic studies, pulse oximetry and re-evaluation of patient's condition.   Length of Stay: 0  Glori Bickers, MD  06/22/2020, 7:10 PM  Advanced Heart Failure Team Pager [phone] (M-F; 7a - 4p)  Please contact [facility] for night-coverage after hours (4p -7a ) and weekends on [email or web address]

## 2020-06-22 NOTE — ED Notes (Signed)
Pt in CT with this RN and CT. Pt to be taken to floor after CT is over.

## 2020-06-22 NOTE — CV Procedure (Signed)
2D echo attempted, but RN states she is going to CT and then transfer to room. Will try echo later.

## 2020-06-22 NOTE — Procedures (Signed)
Patient Name: NEKAYLA HEIDER  MRN: [id number]  Epilepsy Attending: [name]  Referring Physician/Provider: Dr Audria Nine Date: 06/22/2020 Duration: 25.19 mins  Patient history: 82o F with ams. EEG to evaluate for seizure.  Level of alertness: comatose  AEDs during EEG study: None  Technical aspects: This EEG study was done with scalp electrodes positioned according to the 10-20 International system of electrode placement. Electrical activity was acquired at a sampling rate of 500Hz  and reviewed with a high frequency filter of 70Hz  and a low frequency filter of 1Hz . EEG data were recorded continuously and digitally stored.   Description: EEG showed continuous generalized 3 to 6 Hz theta-delta slowing admixed with 13-15Hz  beta activity in frontocentral region. Hyperventilation and photic stimulation were not performed.     ABNORMALITY -Continuous slow, generalized  IMPRESSION: This study is suggestive of severe diffuse encephalopathy, nonspecific etiology but likely related to sedation. No seizures or epileptiform discharges were seen throughout the recording.  Coley Littles [name]

## 2020-06-22 NOTE — Progress Notes (Signed)
  Echocardiogram 2D Echocardiogram has been performed.  Carolyn Proctor 06/22/2020, 5:11 PM

## 2020-06-22 NOTE — ED Notes (Signed)
Pt's CBG result was 31. Informed Lynnze - RN.

## 2020-06-22 NOTE — ED Provider Notes (Signed)
[facility] EMERGENCY DEPARTMENT Provider Note   CSN: [id number] Arrival date & time: 06/22/20  1135     History Chief Complaint  Patient presents with  . Respiratory Arrest    Carolyn Proctor is a 59 y.o. female.  59 yo F with a chief complaint of acute onset loss of consciousness.  Patient reportedly was with family suddenly yelled out I think him having a stroke and collapsed to the ground.  Family denied any illness preceding up to this point.  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  Unknown compliance with her medication.  Denied her having any chest pain over the past few days.  Has been having abdominal pain and is seeing GI thought to be due to chronic constipation and has had escalating doses of MiraLAX.  EMS arrived on the scene and the patient's mental status declined, requiring bagging assistance in route.  They noticed that she had a rightward eye deviation.  Level 5 caveat altered mental status.  The history is provided by the patient.  Illness Severity:  Severe Onset quality:  Gradual Duration:  20 minutes Timing:  Constant Progression:  Worsening Chronicity:  New Associated symptoms: abdominal pain (chronic and unchanged per family)   Associated symptoms: no chest pain, no congestion, no fever, no headaches, no myalgias, no nausea, no rhinorrhea, no shortness of breath, no vomiting and no wheezing        Past Medical History:  Diagnosis Date  . Acute myopericarditis 02/24/2013  . Atrial fibrillation ([place])   . Atrial fibrillation with RVR ([place]) 02/22/2013  . Chronic anticoagulation, Xarelto 04/02/2013  . Chronic diastolic CHF (congestive heart failure) ([place]) 06/26/2019  . Diverticulitis   . Elevated troponin- secondary to Myopericarditis 02/25/2013  . Emphysema  07/07/2019  . Hypertension   . Lupus ([place]) 09/14/2011  . 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 PAF (paroxysmal atrial fibrillation) ([place])  04/02/2013  . Pleuritic chest pain 02/22/2013  . 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 Rheumatoid arthritis ([place]) 07/07/2019  . Rheumatoid lung disease ([place]) 06/23/2019  . Right ventricular dysfunction 08/05/2019  . Thrombocytosis ([place]) 09/14/2011    Patient Active Problem List   Diagnosis Date Noted  . Moderate to severe pulmonary hypertension ([place]) 09/28/2019  . Chronic lung disease   . Emphysema  07/07/2019  . Rheumatoid arthritis ([place]) 07/07/2019  . Healthcare maintenance 07/07/2019  . Chronic diastolic CHF (congestive heart failure) ([place]) 06/26/2019  . 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 PAF (paroxysmal atrial fibrillation) ([place]) 04/02/2013  . Chronic anticoagulation, Xarelto 04/02/2013  . Elevated troponin- secondary to Myopericarditis 02/25/2013  . Atrial fibrillation with RVR ([place]) 02/22/2013  . 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 Thrombocytosis ([place]) 09/14/2011  . Lupus ([place]) 09/14/2011    Past Surgical History:  Procedure Laterality Date  . BONE MARROW BIOPSY  2010  . CARDIAC CATHETERIZATION    . LEFT HEART CATHETERIZATION WITH CORONARY ANGIOGRAM N/A 02/24/2013   Procedure: LEFT HEART CATHETERIZATION WITH CORONARY ANGIOGRAM;  Surgeon: Leonie Man, MD;  Location: [facility] CATH LAB;  Service: Cardiovascular;  Laterality: N/A;  . RIGHT/LEFT HEART CATH AND CORONARY ANGIOGRAPHY N/A 09/09/2019   Procedure: RIGHT/LEFT HEART CATH AND CORONARY ANGIOGRAPHY;  Surgeon: Jettie Booze, MD;  Location: [facility] CV LAB;  Service: Cardiovascular;  Laterality: N/A;     OB History   No obstetric history on file.      Family History  Problem Relation Age of Onset  . Lupus Mother   . Liver disease Mother   . Hypertension Maternal Grandmother   . Lupus Brother     Social History   Tobacco Use  . Smoking status: Never Smoker  . Smokeless tobacco: Never Used  Substance Use Topics  . Alcohol use: No  . Drug use: No    Home Medications Prior to Admission medications   Medication Sig Start Date End Date Taking? Authorizing Provider  albuterol (VENTOLIN HFA) 108 (90 Base) MCG/ACT inhaler Inhale 2 puffs into the lungs every 6 (six) hours as needed for wheezing or shortness of breath. 06/26/19   [place] Donnamarie Poag, DO  Calcium Carbonate-Vit D-Min (CALCIUM 1200 PO) Take 1 capsule by mouth daily.    [provider]  Cholecalciferol (VITAMIN D) 50 MCG (2000 UT) tablet Take 2,000 Units by mouth daily.     [provider]  Cyanocobalamin (VITAMIN B-12) 1000 MCG SUBL Place 1,000 mcg under the tongue.    [provider]  furosemide (LASIX) 20 MG tablet Take 1 tablet (20 mg total) by mouth 3 (three) times a week. 09/23/19   Jettie Booze, MD  pantoprazole (PROTONIX) 40 MG tablet Take 1 tablet (40 mg total) by mouth daily. 09/28/19 12/27/19  Margaretha Seeds, MD  predniSONE ([place]) 5 MG tablet TAKE 2 TABLETS BY MOUTH EVERY DAY 01/18/20   Margaretha Seeds, MD    Allergies    Patient has no known allergies.  Review of Systems   Review of Systems  Unable to perform ROS: Mental status change  Constitutional: Negative for chills and fever.  HENT: Negative for congestion and rhinorrhea.   Eyes: Negative for redness and visual disturbance.  Respiratory: Negative for shortness of breath and wheezing.   Cardiovascular: Negative for chest pain and palpitations.  Gastrointestinal: Positive for abdominal pain (chronic and unchanged per family). Negative for nausea and vomiting.  Genitourinary: Negative for dysuria and urgency.  Musculoskeletal: Negative for arthralgias and  myalgias.  Skin: Negative for pallor and wound.  Neurological: Positive for syncope. Negative for dizziness and headaches.    Physical Exam Updated Vital Signs Temp 97.8 F (36.6 C) (Temporal)   Resp (!) 36   Ht '5\' 11"'  (1.803 m)   LMP 02/19/2013   SpO2 100%   BMI 20.06 kg/m   Physical Exam Vitals and nursing note reviewed.  Constitutional:      General: She is not in acute distress.    Appearance: She is well-developed. She is not diaphoretic.  HENT:     Head: Normocephalic and atraumatic.  Eyes:     Pupils: Pupils are equal, round, and reactive to light.  Cardiovascular:     Rate and Rhythm: Normal rate and regular rhythm.     Heart sounds: No murmur heard.  No friction rub. No gallop.   Pulmonary:     Effort: Pulmonary effort is normal.     Breath sounds: No wheezing or rales.  Abdominal:     General: There is no distension.     Palpations: Abdomen is soft.     Tenderness: There is  no abdominal tenderness.  Musculoskeletal:        General: No tenderness.     Cervical back: Normal range of motion and neck supple.  Skin:    General: Skin is warm and dry.  Neurological:     Mental Status: She is alert.     Comments: Eyes were open and deviated to the right.  No obvious tremor.  Jaw was held in a closed position and rigid     ED Results / Procedures / Treatments   Labs (all labs ordered are listed, but only abnormal results are displayed) Labs Reviewed  CBC WITH DIFFERENTIAL/PLATELET - Abnormal; Notable for the following components:      Result Value   HCT 46.3 (*)    MCV 111.0 (*)    MCHC 28.5 (*)    RDW 19.1 (*)    All other components within normal limits  COMPREHENSIVE METABOLIC PANEL - Abnormal; Notable for the following components:   Sodium 132 (*)    CO2 10 (*)    Glucose, Bld 146 (*)    Creatinine, Ser 1.38 (*)    Calcium 7.9 (*)    Albumin 2.2 (*)    Total Bilirubin 2.9 (*)    GFR calc non Af Amer 43 (*)    GFR calc Af Amer 49 (*)    Anion  gap 19 (*)    All other components within normal limits  LACTIC ACID, PLASMA - Abnormal; Notable for the following components:   Lactic Acid, Venous 10.8 (*)    All other components within normal limits  AMMONIA - Abnormal; Notable for the following components:   Ammonia 157 (*)    All other components within normal limits  CBG MONITORING, ED - Abnormal; Notable for the following components:   Glucose-Capillary 201 (*)    All other components within normal limits  I-STAT CHEM 8, ED - Abnormal; Notable for the following components:   Sodium 133 (*)    Creatinine, Ser 1.10 (*)    Glucose, Bld 134 (*)    Calcium, Ion 1.04 (*)    TCO2 15 (*)    Hemoglobin 16.7 (*)    HCT 49.0 (*)    All other components within normal limits  CBG MONITORING, ED - Abnormal; Notable for the following components:   Glucose-Capillary 134 (*)    All other components within normal limits  I-STAT ARTERIAL BLOOD GAS, ED - Abnormal; Notable for the following components:   pH, Arterial 6.954 (*)    pO2, Arterial 313 (*)    Bicarbonate 9.6 (*)    TCO2 11 (*)    Acid-base deficit 22.0 (*)    Sodium 132 (*)    Calcium, Ion 1.05 (*)    All other components within normal limits  CBG MONITORING, ED - Abnormal; Notable for the following components:   Glucose-Capillary <10 (*)    All other components within normal limits  CBG MONITORING, ED - Abnormal; Notable for the following components:   Glucose-Capillary 50 (*)    All other components within normal limits  CBG MONITORING, ED - Abnormal; Notable for the following components:   Glucose-Capillary 31 (*)    All other components within normal limits  CBG MONITORING, ED - Abnormal; Notable for the following components:   Glucose-Capillary 116 (*)    All other components within normal limits  CBG MONITORING, ED - Abnormal; Notable for the following components:   Glucose-Capillary 136 (*)    All other components within normal  limits  TROPONIN I (HIGH  SENSITIVITY) - Abnormal; Notable for the following components:   Troponin I (High Sensitivity) 34 (*)    All other components within normal limits  SARS CORONAVIRUS 2 BY RT PCR (HOSPITAL ORDER, [facility])  CULTURE, BLOOD (ROUTINE X 2)  CULTURE, BLOOD (ROUTINE X 2)  URINE CULTURE  ETHANOL  LACTIC ACID, PLASMA  BLOOD GAS, ARTERIAL  TSH  T4, FREE  CORTISOL  URINALYSIS, ROUTINE W REFLEX MICROSCOPIC  RAPID URINE DRUG SCREEN, HOSP PERFORMED  PROTIME-INR  MAGNESIUM  PHOSPHORUS  TROPONIN I (HIGH SENSITIVITY)    EKG EKG Interpretation  Date/Time:  Wednesday June 22 2020 12:35:16 EDT Ventricular Rate:  153 PR Interval:    QRS Duration: 90 QT Interval:  340 QTC Calculation: 543 R Axis:   -132 Text Interpretation: Uncertain rhythm: review Probable lateral infarct, old Prolonged QT interval No significant change since last tracing Confirmed by Deno Etienne [phone]) on 06/22/2020 12:38:32 PM   Radiology CT Head Wo Contrast  Result Date: 06/22/2020 CLINICAL DATA:  Unresponsive EXAM: CT HEAD WITHOUT CONTRAST TECHNIQUE: Contiguous axial images were obtained from the base of the skull through the vertex without intravenous contrast. COMPARISON:  01/16/2013 FINDINGS: Brain: No evidence of acute infarction, hemorrhage, hydrocephalus, extra-axial collection or mass lesion/mass effect. Vascular: No hyperdense vessel or unexpected calcification. Skull: Normal. Negative for fracture or focal lesion. Sinuses/Orbits: No acute finding. Other: None. IMPRESSION: No acute intracranial abnormality noted. Electronically Signed   By: Inez Catalina M.D.   On: 06/22/2020 12:42   DG Chest Portable 1 View  Result Date: 06/22/2020 CLINICAL DATA:  Central catheter placement.  Hypoxia. EXAM: PORTABLE CHEST 1 VIEW COMPARISON:  June 22, 2020 study obtained earlier in the day FINDINGS: Central catheter tip is in the superior vena cava. Central catheter tip is 2.7 cm above the carina.  Nasogastric tube tip and side port are below the diaphragm. No pneumothorax. There is airspace consolidation in portions of each lower lobe. There is a loculated effusion on the right. There is diffuse interstitial thickening throughout the lungs bilaterally with interstitial and patchy airspace opacity throughout the mid and lower lung regions, slightly increased from earlier in the day. There is cardiomegaly with pulmonary vascularity normal. No adenopathy. No bone lesions. IMPRESSION: Tube and catheter positions as described without pneumothorax. Persistent loculated pleural effusion on the right. Consolidation in each lower lung region, increased. Widespread interstitial and patchy airspace opacity throughout the mid and lower lungs has progressed slightly. Electronically Signed   By: Lowella Grip III M.D.   On: 06/22/2020 13:48   DG Chest Port 1 View  Result Date: 06/22/2020 CLINICAL DATA:  Hypoxia EXAM: PORTABLE CHEST 1 VIEW COMPARISON:  June 23, 2019 FINDINGS: Endotracheal tube tip is 5.3 cm above the carina. Nasogastric tube tip and side port are below the diaphragm. No pneumothorax. There is apparent layering effusion on the right. There is diffuse interstitial thickening with suspected underlying fibrosis. There is no frank edema or airspace opacity. There is cardiomegaly with pulmonary vascularity normal. No adenopathy. No bone lesions. IMPRESSION: Tube positions as described without pneumothorax. Apparent layering effusion on the right. Cardiomegaly. No frank edema or consolidation. Underlying fibrosis present bilaterally. Electronically Signed   By: Lowella Grip III M.D.   On: 06/22/2020 12:16    Procedures Procedure Name: Intubation Date/Time: 06/22/2020 1:51 PM Performed by: Deno Etienne, DO Pre-anesthesia Checklist: Patient identified, Patient being monitored, Emergency Drugs available, Timeout performed and Suction available Oxygen Delivery Method:  Non-rebreather  mask Preoxygenation: Pre-oxygenation with 100% oxygen Induction Type: Rapid sequence Ventilation: Mask ventilation without difficulty Laryngoscope Size: Glidescope Grade View: Grade I Tube size: 7.5 mm Number of attempts: 1 Airway Equipment and Method: Video-laryngoscopy Placement Confirmation: ETT inserted through vocal cords under direct vision,  CO2 detector and Breath sounds checked- equal and bilateral Secured at: 23 cm Tube secured with: ETT holder Dental Injury: Teeth and Oropharynx as per pre-operative assessment  Difficulty Due To: Difficulty was anticipated Future Recommendations: Recommend- induction with short-acting agent, and alternative techniques readily available    .Central Line  Date/Time: 06/22/2020 1:51 PM Performed by: Deno Etienne, DO Authorized by: Deno Etienne, DO   Consent:    Consent obtained:  Verbal   Consent given by: daughter.   Risks discussed:  Arterial puncture, incorrect placement, bleeding and infection   Alternatives discussed:  No treatment, delayed treatment and alternative treatment Pre-procedure details:    Hand hygiene: Hand hygiene performed prior to insertion     Sterile barrier technique: All elements of maximal sterile technique followed     Skin preparation:  2% chlorhexidine   Skin preparation agent: Skin preparation agent completely dried prior to procedure   Anesthesia (see MAR for exact dosages):    Anesthesia method:  None Procedure details:    Location:  R internal jugular   Patient position:  Trendelenburg   Procedural supplies:  Triple lumen   Catheter size:  7 Fr   Landmarks identified: yes     Ultrasound guidance: yes     Sterile ultrasound techniques: Sterile gel and sterile probe covers were used     Number of attempts:  3   Successful placement: yes   Post-procedure details:    Post-procedure:  Dressing applied   Assessment:  Blood return through all ports, free fluid flow and placement verified by x-ray   Patient  tolerance of procedure:  Tolerated well, no immediate complications   (including critical care time)  Medications Ordered in ED Medications  fentaNYL (SUBLIMAZE) injection 50 mcg (has no administration in time range)  fentaNYL 2550mg in NS 2542m(1070mml) infusion-PREMIX (100 mcg/hr Intravenous New Bag/Given 06/22/20 1206)  fentaNYL (SUBLIMAZE) bolus via infusion 50 mcg (has no administration in time range)  dextrose 10 % infusion ( Intravenous Rate/Dose Change 06/22/20 1253)  midazolam (VERSED) injection 2 mg (has no administration in time range)  midazolam (VERSED) injection 2 mg (2 mg Intravenous Given 06/22/20 1208)  vancomycin (VANCOREADY) IVPB 1250 mg/250 mL (has no administration in time range)  piperacillin-tazobactam (ZOSYN) IVPB 3.375 g (has no administration in time range)  methylPREDNISolone sodium succinate (SOLU-MEDROL) 125 mg/2 mL injection 125 mg (has no administration in time range)  sodium chloride 0.9 % bolus 2,000 mL (2,000 mLs Intravenous New Bag/Given 06/22/20 1232)  dextrose 50 % solution 50 mL (50 mLs Intravenous Given 06/22/20 1241)  dextrose 50 % solution 50 mL (50 mLs Intravenous Given 06/22/20 1252)    ED Course  I have reviewed the triage vital signs and the nursing notes.  Pertinent labs & imaging results that were available during my care of the patient were reviewed by me and considered in my medical decision making (see chart for details).    MDM Rules/Calculators/A&P                          [age] F with a chief complaint of altered mental status.  The patient suddenly collapsed in front of family yelling that  she had had a stroke.  Patient had rightward eye deviation on EMS arrival and had worsening mental status.  Found to have a blood sugar in the 20s.  Not improving with amps of D50.  Blood sugar remained in the 20s on arrival.  Not immediately intubate with the thought she may be just profoundly hypoglycemic.  Given 2 boluses of D50 here and started on  a D10 drip, blood sugar in the 200s with some modest improvement in mental status though still not following commands or localizing the pain.  At one point there was a thought that she had lost pulses and CPR was started for about 10 seconds she started moving diffusely and CPR was stopped and a pulse was found.  Patient was intubated.  Postintubation the patient's oxygen saturation dropped into the 70s and despite increasing the patient's PEEP did not significant increase in oxygenation.  Brought back to CT scan for concern for acute intracranial hemorrhage.  No obvious bleed is viewed by me.  ABG with hyperoxia, unsure of the reasoning for the low pulse ox.  Discussed with critical care for admission.  Patient became hypoglycemic again received 2 boluses of D50 and had the D10 drip increased.  Patient's lactic acid was significantly elevated.  With her chronically immunosuppressed she was given broad-spectrum antibiotics.  With her continued hypoglycemia considered adrenal suppression and given this dose of Solu-Medrol, cortisol level and TSH ordered.  CRITICAL CARE Performed by: Cecilio Asper   Total critical care time: 80 minutes  Critical care time was exclusive of separately billable procedures and treating other patients.  Critical care was necessary to treat or prevent imminent or life-threatening deterioration.  Critical care was time spent personally by me on the following activities: development of treatment plan with patient and/or surrogate as well as nursing, discussions with consultants, evaluation of patient's response to treatment, examination of patient, obtaining history from patient or surrogate, ordering and performing treatments and interventions, ordering and review of laboratory studies, ordering and review of radiographic studies, pulse oximetry and re-evaluation of patient's condition.  The patients results and plan were reviewed and discussed.   Any x-rays  performed were independently reviewed by myself.   Differential diagnosis were considered with the presenting HPI.  Medications  fentaNYL (SUBLIMAZE) injection 50 mcg (has no administration in time range)  fentaNYL 2560mg in NS 2529m(1075mml) infusion-PREMIX (100 mcg/hr Intravenous New Bag/Given 06/22/20 1206)  fentaNYL (SUBLIMAZE) bolus via infusion 50 mcg (has no administration in time range)  dextrose 10 % infusion ( Intravenous Rate/Dose Change 06/22/20 1253)  midazolam (VERSED) injection 2 mg (has no administration in time range)  midazolam (VERSED) injection 2 mg (2 mg Intravenous Given 06/22/20 1208)  vancomycin (VANCOREADY) IVPB 1250 mg/250 mL (has no administration in time range)  piperacillin-tazobactam (ZOSYN) IVPB 3.375 g (has no administration in time range)  methylPREDNISolone sodium succinate (SOLU-MEDROL) 125 mg/2 mL injection 125 mg (has no administration in time range)  sodium chloride 0.9 % bolus 2,000 mL (2,000 mLs Intravenous New Bag/Given 06/22/20 1232)  dextrose 50 % solution 50 mL (50 mLs Intravenous Given 06/22/20 1241)  dextrose 50 % solution 50 mL (50 mLs Intravenous Given 06/22/20 1252)    Vitals:   06/22/20 1136 06/22/20 1203  Resp: (!) 36   Temp: 97.8 F (36.6 C)   TempSrc: Temporal   SpO2:  100%  Height:  '5\' 11"'  (1.803 m)    Final diagnoses:  Acute respiratory failure, unspecified whether with hypoxia or hypercapnia (  [place])  Hypoglycemic coma ([place])    Admission/ observation were discussed with the admitting physician, patient and/or family and they are comfortable with the plan.    Final Clinical Impression(s) / ED Diagnoses Final diagnoses:  Acute respiratory failure, unspecified whether with hypoxia or hypercapnia ([place])  Hypoglycemic coma [facility])    Rx / DC Orders ED Discharge Orders    None       Deno Etienne, DO 06/22/20 1353

## 2020-06-22 NOTE — ED Notes (Signed)
#  2 amp d50

## 2020-06-23 ENCOUNTER — Inpatient Hospital Stay (HOSPITAL_COMMUNITY): Payer: BC Managed Care – PPO

## 2020-06-23 LAB — POCT I-STAT 7, (LYTES, BLD GAS, ICA,H+H)
Acid-Base Excess: 0 mmol/L (ref 0.0–2.0)
Acid-base deficit: 11 mmol/L — ABNORMAL HIGH (ref 0.0–2.0)
Bicarbonate: 21.1 mmol/L (ref 20.0–28.0)
Bicarbonate: 15.2 mmol/L — ABNORMAL LOW (ref 20.0–28.0)
Calcium, Ion: 1.01 mmol/L — ABNORMAL LOW (ref 1.15–1.40)
Calcium, Ion: 0.98 mmol/L — ABNORMAL LOW (ref 1.15–1.40)
HCT: 42 % (ref 36.0–46.0)
HCT: 46 % (ref 36.0–46.0)
Hemoglobin: 14.3 g/dL (ref 12.0–15.0)
Hemoglobin: 15.6 g/dL — ABNORMAL HIGH (ref 12.0–15.0)
O2 Saturation: 96 %
O2 Saturation: 100 %
Patient temperature: 36.5
Patient temperature: 36.8
Potassium: 3.2 mmol/L — ABNORMAL LOW (ref 3.5–5.1)
Potassium: 4 mmol/L (ref 3.5–5.1)
Sodium: 136 mmol/L (ref 135–145)
Sodium: 133 mmol/L — ABNORMAL LOW (ref 135–145)
TCO2: 22 mmol/L (ref 22–32)
TCO2: 16 mmol/L — ABNORMAL LOW (ref 22–32)
pCO2 arterial: 23.6 mmHg — ABNORMAL LOW (ref 32.0–48.0)
pCO2 arterial: 33 mmHg (ref 32.0–48.0)
pH, Arterial: 7.557 — ABNORMAL HIGH (ref 7.350–7.450)
pH, Arterial: 7.271 — ABNORMAL LOW (ref 7.350–7.450)
pO2, Arterial: 65 mmHg — ABNORMAL LOW (ref 83.0–108.0)
pO2, Arterial: 357 mmHg — ABNORMAL HIGH (ref 83.0–108.0)

## 2020-06-23 LAB — BLOOD CULTURE ID PANEL (REFLEXED) - BCID2

## 2020-06-23 LAB — HEPARIN LEVEL (UNFRACTIONATED): Heparin Unfractionated: 1.86 IU/mL — ABNORMAL HIGH (ref 0.30–0.70)

## 2020-06-23 LAB — COMPREHENSIVE METABOLIC PANEL
ALT: 20 U/L (ref 0–44)
AST: 43 U/L — ABNORMAL HIGH (ref 15–41)
Albumin: 1.7 g/dL — ABNORMAL LOW (ref 3.5–5.0)
Alkaline Phosphatase: 91 U/L (ref 38–126)
Anion gap: 11 (ref 5–15)
BUN: 16 mg/dL (ref 6–20)
CO2: 23 mmol/L (ref 22–32)
Calcium: 7.2 mg/dL — ABNORMAL LOW (ref 8.9–10.3)
Chloride: 99 mmol/L (ref 98–111)
Creatinine, Ser: 1.32 mg/dL — ABNORMAL HIGH (ref 0.44–1.00)
GFR calc Af Amer: 52 mL/min — ABNORMAL LOW (ref >60)
GFR calc non Af Amer: 45 mL/min — ABNORMAL LOW (ref >60)
Glucose, Bld: 220 mg/dL — ABNORMAL HIGH (ref 70–99)
Potassium: 3.1 mmol/L — ABNORMAL LOW (ref 3.5–5.1)
Sodium: 133 mmol/L — ABNORMAL LOW (ref 135–145)
Total Bilirubin: 2.1 mg/dL — ABNORMAL HIGH (ref 0.3–1.2)
Total Protein: 5.2 g/dL — ABNORMAL LOW (ref 6.5–8.1)

## 2020-06-23 LAB — GLUCOSE, CAPILLARY
Glucose-Capillary: 151 mg/dL — ABNORMAL HIGH (ref 70–99)
Glucose-Capillary: 166 mg/dL — ABNORMAL HIGH (ref 70–99)
Glucose-Capillary: 173 mg/dL — ABNORMAL HIGH (ref 70–99)
Glucose-Capillary: 186 mg/dL — ABNORMAL HIGH (ref 70–99)
Glucose-Capillary: 208 mg/dL — ABNORMAL HIGH (ref 70–99)
Glucose-Capillary: 215 mg/dL — ABNORMAL HIGH (ref 70–99)
Glucose-Capillary: 215 mg/dL — ABNORMAL HIGH (ref 70–99)
Glucose-Capillary: 216 mg/dL — ABNORMAL HIGH (ref 70–99)
Glucose-Capillary: 226 mg/dL — ABNORMAL HIGH (ref 70–99)
Glucose-Capillary: 228 mg/dL — ABNORMAL HIGH (ref 70–99)
Glucose-Capillary: 229 mg/dL — ABNORMAL HIGH (ref 70–99)
Glucose-Capillary: 236 mg/dL — ABNORMAL HIGH (ref 70–99)
Glucose-Capillary: 243 mg/dL — ABNORMAL HIGH (ref 70–99)
Glucose-Capillary: 595 mg/dL (ref 70–99)
Glucose-Capillary: <10 mg/dL — CL (ref 70–99)

## 2020-06-23 LAB — HEPATITIS PANEL, ACUTE
HCV Ab: NONREACTIVE
Hep A IgM: NONREACTIVE
Hep B C IgM: NONREACTIVE
Hepatitis B Surface Ag: NONREACTIVE

## 2020-06-23 LAB — CBC
HCT: 38.3 % (ref 36.0–46.0)
Hemoglobin: 12.5 g/dL (ref 12.0–15.0)
MCH: 31.6 pg (ref 26.0–34.0)
MCHC: 32.6 g/dL (ref 30.0–36.0)
MCV: 96.7 fL (ref 80.0–100.0)
Platelets: 254 10*3/uL (ref 150–400)
RBC: 3.96 MIL/uL (ref 3.87–5.11)
RDW: 17.8 % — ABNORMAL HIGH (ref 11.5–15.5)
WBC: 9.4 10*3/uL (ref 4.0–10.5)
nRBC: 0 % (ref 0.0–0.2)

## 2020-06-23 LAB — BASIC METABOLIC PANEL
Anion gap: 12 (ref 5–15)
Anion gap: 12 (ref 5–15)
BUN: 15 mg/dL (ref 6–20)
BUN: 15 mg/dL (ref 6–20)
CO2: 17 mmol/L — ABNORMAL LOW (ref 22–32)
CO2: 23 mmol/L (ref 22–32)
Calcium: 7.1 mg/dL — ABNORMAL LOW (ref 8.9–10.3)
Calcium: 7.4 mg/dL — ABNORMAL LOW (ref 8.9–10.3)
Chloride: 102 mmol/L (ref 98–111)
Chloride: 99 mmol/L (ref 98–111)
Creatinine, Ser: 1.36 mg/dL — ABNORMAL HIGH (ref 0.44–1.00)
Creatinine, Ser: 1.45 mg/dL — ABNORMAL HIGH (ref 0.44–1.00)
GFR calc Af Amer: 50 mL/min — ABNORMAL LOW (ref >60)
GFR calc Af Amer: 47 mL/min — ABNORMAL LOW (ref >60)
GFR calc non Af Amer: 43 mL/min — ABNORMAL LOW (ref >60)
GFR calc non Af Amer: 40 mL/min — ABNORMAL LOW (ref >60)
Glucose, Bld: 337 mg/dL — ABNORMAL HIGH (ref 70–99)
Glucose, Bld: 232 mg/dL — ABNORMAL HIGH (ref 70–99)
Potassium: 3.5 mmol/L (ref 3.5–5.1)
Potassium: 3.7 mmol/L (ref 3.5–5.1)
Sodium: 131 mmol/L — ABNORMAL LOW (ref 135–145)
Sodium: 134 mmol/L — ABNORMAL LOW (ref 135–145)

## 2020-06-23 LAB — PHOSPHORUS
Phosphorus: 3.4 mg/dL (ref 2.5–4.6)
Phosphorus: 3.2 mg/dL (ref 2.5–4.6)

## 2020-06-23 LAB — PROTIME-INR
INR: 2.9 — ABNORMAL HIGH (ref 0.8–1.2)
Prothrombin Time: 29.6 seconds — ABNORMAL HIGH (ref 11.4–15.2)

## 2020-06-23 LAB — LACTIC ACID, PLASMA
Lactic Acid, Venous: 7.9 mmol/L (ref 0.5–1.9)
Lactic Acid, Venous: 4.9 mmol/L (ref 0.5–1.9)

## 2020-06-23 LAB — PROCALCITONIN: Procalcitonin: 11.22 ng/mL

## 2020-06-23 LAB — HEMOGLOBIN A1C
Hgb A1c MFr Bld: 5.5 % (ref 4.8–5.6)
Mean Plasma Glucose: 111.15 mg/dL

## 2020-06-23 LAB — COOXEMETRY PANEL
Carboxyhemoglobin: 2 % — ABNORMAL HIGH (ref 0.5–1.5)
Methemoglobin: 0.8 % (ref 0.0–1.5)
O2 Saturation: 90.8 %
Total hemoglobin: 13.3 g/dL (ref 12.0–16.0)

## 2020-06-23 LAB — MAGNESIUM
Magnesium: 1.4 mg/dL — ABNORMAL LOW (ref 1.7–2.4)
Magnesium: 1.5 mg/dL — ABNORMAL LOW (ref 1.7–2.4)

## 2020-06-23 LAB — APTT
aPTT: 35 seconds (ref 24–36)
aPTT: 105 seconds — ABNORMAL HIGH (ref 24–36)

## 2020-06-23 LAB — MRSA PCR SCREENING: MRSA by PCR: NEGATIVE

## 2020-06-23 MED ORDER — AMIODARONE LOAD VIA INFUSION
150.0000 mg | Freq: Once | INTRAVENOUS | Status: AC
  Filled 2020-06-23: qty 83.34

## 2020-06-23 MED ORDER — POTASSIUM CHLORIDE 20 MEQ/15ML (10%) PO SOLN
ORAL | Status: AC
  Administered 2020-06-23: 40 meq
  Filled 2020-06-23: qty 30

## 2020-06-23 MED ORDER — SODIUM BICARBONATE 8.4 % IV SOLN
100.0000 meq | Freq: Once | INTRAVENOUS | Status: AC
  Administered 2020-06-23: 100 meq via INTRAVENOUS

## 2020-06-23 MED ORDER — EPINEPHRINE 1 MG/10ML IJ SOSY
PREFILLED_SYRINGE | INTRAMUSCULAR | Status: AC
  Administered 2020-06-23: 1 mg
  Filled 2020-06-23: qty 10

## 2020-06-23 MED ORDER — HEPARIN (PORCINE) 25000 UT/250ML-% IV SOLN
1350.0000 [IU]/h | INTRAVENOUS | Status: DC
  Administered 2020-06-23: 1200 [IU]/h via INTRAVENOUS
  Administered 2020-06-24: 900 [IU]/h via INTRAVENOUS
  Administered 2020-06-26 – 2020-07-01 (×6): 1100 [IU]/h via INTRAVENOUS
  Administered 2020-07-02: 1200 [IU]/h via INTRAVENOUS
  Administered 2020-07-03 – 2020-07-04 (×2): 1250 [IU]/h via INTRAVENOUS
  Administered 2020-07-05: 1350 [IU]/h via INTRAVENOUS
  Filled 2020-06-23 (×13): qty 250

## 2020-06-23 MED ORDER — CALCIUM GLUCONATE-NACL 1-0.675 GM/50ML-% IV SOLN
1.0000 g | Freq: Once | INTRAVENOUS | Status: AC
  Administered 2020-06-23: 1000 mg via INTRAVENOUS

## 2020-06-23 MED ORDER — LORAZEPAM 2 MG/ML IJ SOLN
INTRAMUSCULAR | Status: AC
  Administered 2020-06-23: 4 mg
  Filled 2020-06-23: qty 1

## 2020-06-23 MED ORDER — MAGNESIUM SULFATE 4 GM/100ML IV SOLN
4.0000 g | Freq: Once | INTRAVENOUS | Status: AC
  Administered 2020-06-23: 4 g via INTRAVENOUS
  Filled 2020-06-23: qty 100

## 2020-06-23 MED ORDER — INSULIN ASPART 100 UNIT/ML ~~LOC~~ SOLN: 0.0000 [IU] | SUBCUTANEOUS | Status: DC | PRN

## 2020-06-23 MED ORDER — VANCOMYCIN HCL 750 MG/150ML IV SOLN
750.0000 mg | Freq: Two times a day (BID) | INTRAVENOUS | Status: DC
  Administered 2020-06-23 – 2020-06-24 (×2): 750 mg via INTRAVENOUS
  Filled 2020-06-23 (×4): qty 150

## 2020-06-23 MED ORDER — NOREPINEPHRINE 4 MG/250ML-% IV SOLN
INTRAVENOUS | Status: AC
  Filled 2020-06-23: qty 250

## 2020-06-23 MED ORDER — NOREPINEPHRINE 4 MG/250ML-% IV SOLN
0.0000 ug/min | INTRAVENOUS | Status: DC
  Administered 2020-06-23: 7 ug/min via INTRAVENOUS
  Administered 2020-06-23: 20 ug/min via INTRAVENOUS
  Administered 2020-06-24: 9 ug/min via INTRAVENOUS
  Administered 2020-06-24: 7 ug/min via INTRAVENOUS
  Filled 2020-06-23 (×4): qty 250

## 2020-06-23 MED ORDER — INSULIN ASPART 100 UNIT/ML ~~LOC~~ SOLN
0.0000 [IU] | SUBCUTANEOUS | Status: DC
  Administered 2020-06-23 (×3): 2 [IU] via SUBCUTANEOUS
  Administered 2020-06-23: 3 [IU] via SUBCUTANEOUS
  Administered 2020-06-24: 2 [IU] via SUBCUTANEOUS
  Administered 2020-06-24: 1 [IU] via SUBCUTANEOUS
  Administered 2020-06-24: 2 [IU] via SUBCUTANEOUS
  Administered 2020-06-24 (×2): 3 [IU] via SUBCUTANEOUS
  Administered 2020-06-25 (×2): 2 [IU] via SUBCUTANEOUS
  Administered 2020-06-25 (×2): 1 [IU] via SUBCUTANEOUS
  Administered 2020-06-25: 3 [IU] via SUBCUTANEOUS
  Administered 2020-06-26 (×5): 2 [IU] via SUBCUTANEOUS
  Administered 2020-06-27: 1 [IU] via SUBCUTANEOUS
  Administered 2020-06-27 (×5): 2 [IU] via SUBCUTANEOUS
  Administered 2020-06-28 (×2): 3 [IU] via SUBCUTANEOUS
  Administered 2020-06-28 – 2020-06-29 (×2): 2 [IU] via SUBCUTANEOUS
  Administered 2020-06-29: 3 [IU] via SUBCUTANEOUS
  Administered 2020-06-29 (×2): 2 [IU] via SUBCUTANEOUS
  Administered 2020-06-29: 1 [IU] via SUBCUTANEOUS
  Administered 2020-06-29 – 2020-06-30 (×2): 3 [IU] via SUBCUTANEOUS
  Administered 2020-06-30: 1 [IU] via SUBCUTANEOUS
  Administered 2020-06-30 (×2): 2 [IU] via SUBCUTANEOUS
  Administered 2020-06-30 – 2020-07-01 (×2): 1 [IU] via SUBCUTANEOUS
  Administered 2020-07-01: 3 [IU] via SUBCUTANEOUS
  Administered 2020-07-01: 2 [IU] via SUBCUTANEOUS
  Administered 2020-07-01: 1 [IU] via SUBCUTANEOUS
  Administered 2020-07-01 (×2): 3 [IU] via SUBCUTANEOUS
  Administered 2020-07-02: 1 [IU] via SUBCUTANEOUS
  Administered 2020-07-03: 3 [IU] via SUBCUTANEOUS
  Administered 2020-07-03: 2 [IU] via SUBCUTANEOUS
  Administered 2020-07-03 (×2): 3 [IU] via SUBCUTANEOUS
  Administered 2020-07-03: 5 [IU] via SUBCUTANEOUS
  Administered 2020-07-04 (×2): 1 [IU] via SUBCUTANEOUS
  Administered 2020-07-04: 3 [IU] via SUBCUTANEOUS

## 2020-06-23 MED ORDER — VITAL 1.5 CAL PO LIQD
1000.0000 mL | ORAL | Status: DC
  Administered 2020-06-23 – 2020-07-04 (×9): 1000 mL
  Filled 2020-06-23 (×10): qty 1000

## 2020-06-23 MED ORDER — INSULIN ASPART 100 UNIT/ML ~~LOC~~ SOLN
3.0000 [IU] | SUBCUTANEOUS | Status: DC
  Administered 2020-06-24 – 2020-07-05 (×61): 3 [IU] via SUBCUTANEOUS

## 2020-06-23 MED ORDER — POTASSIUM CHLORIDE 20 MEQ/15ML (10%) PO SOLN
40.0000 meq | Freq: Once | ORAL | Status: AC
  Administered 2020-06-23: 40 meq

## 2020-06-23 MED ORDER — AMIODARONE HCL IN DEXTROSE 360-4.14 MG/200ML-% IV SOLN
INTRAVENOUS | Status: AC
  Administered 2020-06-23: 150 mg via INTRAVENOUS
  Filled 2020-06-23: qty 200

## 2020-06-23 MED ORDER — METOPROLOL TARTRATE 5 MG/5ML IV SOLN
2.5000 mg | INTRAVENOUS | Status: AC | PRN
  Administered 2020-06-23 (×2): 5 mg via INTRAVENOUS
  Filled 2020-06-23: qty 5

## 2020-06-23 MED ORDER — SODIUM CHLORIDE 0.9 % IV SOLN: INTRAVENOUS | Status: DC | PRN

## 2020-06-23 MED ORDER — PROSOURCE TF PO LIQD: 45.0000 mL | Freq: Two times a day (BID) | ORAL | Status: DC

## 2020-06-23 MED ORDER — PIPERACILLIN-TAZOBACTAM 3.375 G IVPB
3.3750 g | Freq: Three times a day (TID) | INTRAVENOUS | Status: AC
  Administered 2020-06-23 – 2020-06-27 (×14): 3.375 g via INTRAVENOUS
  Filled 2020-06-23 (×15): qty 50

## 2020-06-23 MED ORDER — METOPROLOL TARTRATE 5 MG/5ML IV SOLN
INTRAVENOUS | Status: AC
  Administered 2020-06-23: 5 mg via INTRAVENOUS
  Filled 2020-06-23: qty 5

## 2020-06-23 MED ORDER — SODIUM BICARBONATE 8.4 % IV SOLN
INTRAVENOUS | Status: AC
  Filled 2020-06-23: qty 100

## 2020-06-23 MED ORDER — SODIUM BICARBONATE 4.2 % IV SOLN: 50.0000 meq | Freq: Once | INTRAVENOUS | Status: DC

## 2020-06-23 MED ORDER — AMIODARONE HCL IN DEXTROSE 360-4.14 MG/200ML-% IV SOLN
30.0000 mg/h | INTRAVENOUS | Status: DC
  Filled 2020-06-23: qty 200

## 2020-06-23 MED ORDER — EPINEPHRINE HCL 5 MG/250ML IV SOLN IN NS: 0.5000 ug/min | INTRAVENOUS | Status: DC

## 2020-06-23 MED ORDER — VITAL HIGH PROTEIN PO LIQD: 1000.0000 mL | ORAL | Status: DC

## 2020-06-23 MED ORDER — PROSOURCE TF PO LIQD
90.0000 mL | Freq: Two times a day (BID) | ORAL | Status: DC
  Administered 2020-06-23 – 2020-07-05 (×24): 90 mL
  Filled 2020-06-23 (×24): qty 90

## 2020-06-23 MED ORDER — METHYLPREDNISOLONE SODIUM SUCC 125 MG IJ SOLR
80.0000 mg | Freq: Every day | INTRAMUSCULAR | Status: DC
  Administered 2020-06-25: 80 mg via INTRAVENOUS
  Filled 2020-06-23: qty 2

## 2020-06-23 MED ORDER — CALCIUM GLUCONATE-NACL 1-0.675 GM/50ML-% IV SOLN
INTRAVENOUS | Status: AC
  Filled 2020-06-23: qty 50

## 2020-06-23 MED ORDER — PANTOPRAZOLE SODIUM 40 MG PO PACK
40.0000 mg | PACK | Freq: Every day | ORAL | Status: DC
  Administered 2020-06-23 – 2020-07-26 (×33): 40 mg
  Filled 2020-06-23 (×36): qty 20

## 2020-06-23 MED ORDER — EPINEPHRINE HCL 5 MG/250ML IV SOLN IN NS
INTRAVENOUS | Status: AC
  Filled 2020-06-23: qty 250

## 2020-06-23 MED ORDER — CHLORHEXIDINE GLUCONATE CLOTH 2 % EX PADS
6.0000 | MEDICATED_PAD | Freq: Every day | CUTANEOUS | Status: DC
  Administered 2020-06-23: 6 via TOPICAL

## 2020-06-23 MED ORDER — AMIODARONE HCL IN DEXTROSE 360-4.14 MG/200ML-% IV SOLN
60.0000 mg/h | INTRAVENOUS | Status: AC
  Administered 2020-06-23: 60 mg/h via INTRAVENOUS

## 2020-06-23 MED ORDER — INSULIN ASPART 100 UNIT/ML ~~LOC~~ SOLN: 0.0000 [IU] | Freq: Three times a day (TID) | SUBCUTANEOUS | Status: DC

## 2020-06-23 NOTE — Progress Notes (Signed)
ANTICOAGULATION CONSULT NOTE - Follow Up Consult  Pharmacy Consult for Heparin Indication: Recent DVT/PE (June 2021), Afib, Protein C Deficiency  No Known Allergies  Patient Measurements: Height: 5\' 11"  (180.3 cm) Weight: 74.2 kg (163 lb 9.3 oz) IBW/kg (Calculated) : 70.8 Heparin Dosing Weight: 74.2 kg  Vital Signs: Temp: 96.6 F (35.9 C) (09/16 1815) BP: 88/68 (09/16 1800) Pulse Rate: 92 (09/16 1815)  Labs: Recent Labs    06/22/20 1139 06/22/20 1140 06/22/20 1358 06/22/20 1508 06/22/20 2110 06/22/20 2215 06/23/20 0534 06/23/20 0534 06/23/20 0541 06/23/20 1011 06/23/20 1316 06/23/20 1716  HGB 13.2   < >  --    < >  --    < > 12.5   < > 14.3  --  15.6*  --   HCT 46.3*   < >  --    < >  --    < > 38.3  --  42.0  --  46.0  --   PLT 248  --   --   --   --   --  254  --   --   --   --   --   APTT  --   --   --   --   --   --   --   --   --  35  --  105*  LABPROT  --   --  42.6*  --   --   --  29.6*  --   --   --   --   --   INR  --   --  4.7*  --   --   --  2.9*  --   --   --   --   --   HEPARINUNFRC  --   --   --   --   --   --   --   --   --  1.86*  --   --   CREATININE 1.38*   < >  --    < > 1.36*  --  1.32*  --   --  1.45*  --   --   TROPONINIHS 34*  --  125*  --   --   --   --   --   --   --   --   --    < > = values in this interval not displayed.    Estimated Creatinine Clearance: 48.4 mL/min (A) (by C-G formula based on SCr of 1.45 mg/dL (H)).   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 Apixaban is now on hold, with plans to start heparin for bridging.  BL aPTT is 35 sec and heparin level is 1.86 units/ml (falsely elevated in the setting of recent apixaban use). INR is also noted to be falsely elevated at 2.9. Due to recent apixaban exposure, will monitor anticoagulation with aPTT until aPTT and heparin levels correlate.  Initial aPTT ~4 hrs after starting heparin infusion at 1200 units/hr was 105 sec, which is above the goal range for this pt. Most recent H/H 15.6/46.0, platelets 254. Per RN, no issues with IV or bleeding observed.  Goal of Therapy:  Heparin level 0.3-0.7 units/ml aPTT 66-102 seconds Monitor platelets by anticoagulation protocol: Yes   Plan:  Reduce heparin infusion to 1100 units/hr Check aPTT, heparin level in 6 hrs Monitor daily aPTT, heparin level, CBC Monitor for signs/symptoms of bleeding  Thank you for allowing pharmacy to be a part of this patients care.  Gillermina Hu, PharmD, BCPS, [facility] Clinical Pharmacist 06/23/2020 6:58 PM

## 2020-06-23 NOTE — Progress Notes (Signed)
RT called to patient room due to O2 desaturation. Upon arrival, RN bagging. Patient sat 15%. Patient bagged with Peep valve of 20 while fem art line was placed by MD. ABG showed Pa02 of 350. Patient placed back on the vent at this time. MD and RN still at bedside. RT will continue to monitor.

## 2020-06-23 NOTE — Progress Notes (Signed)
Pharmacy Antibiotic Note  Carolyn Proctor is a 59 y.o. female admitted on 06/22/2020 with unresponsiveness, cardiogenic shock with elevated PCT/LA and concern for PNA.  Pharmacy has been consulted for Vancomycin and Zosyn dosing.  Vanc 1250 mg x 1 and Zosyn 3.375g IV x 1 were given in the ED on 9/15 around 1400. SCr 1.45, CrCl~40-50 ml/min  Plan: - Restart Zosyn at 3.375g IV every 8 hours (infused over 4 hours) - Restart Vancomycin 750 mg IV every 12 hours - Will continue to follow renal function, culture results, LOT, and antibiotic de-escalation plans   Height: 5\' 11"  (180.3 cm) Weight: 74.2 kg (163 lb 9.3 oz) IBW/kg (Calculated) : 70.8  Temp (24hrs), Avg:97.7 F (36.5 C), Min:94.4 F (34.7 C), Max:99 F (37.2 C)  Recent Labs  Lab 06/22/20 1139 06/22/20 1140 06/22/20 1147 06/22/20 1429 06/22/20 2110 06/23/20 0534  WBC 9.3  --   --   --   --  9.4  CREATININE 1.38* 1.10*  --   --  1.36* 1.32*  LATICACIDVEN  --   --  10.8* 5.8*  --   --     Estimated Creatinine Clearance: 53.2 mL/min (A) (by C-G formula based on SCr of 1.32 mg/dL (H)).    No Known Allergies  Antimicrobials this admission: Vanc 9/15 x 1; restart 9/16 Zosyn 9/15 x 1; restart 9/16  Dose adjustments this admission: n/a  Microbiology results: 9/15 BCx >> 1/4 Strep species (deemed contaminant) 9/16 RCx >> 9/16 MRSA PCR >>  Thank you for allowing pharmacy to be a part of this patient's care.  Alycia Rossetti, PharmD, BCPS Clinical Pharmacist Clinical phone for 06/23/2020: [phone] 06/23/2020 10:13 AM   **Pharmacist phone directory can now be found on [email or web address] ([email or web address]).  Listed under [place].

## 2020-06-23 NOTE — Progress Notes (Signed)
eLink Physician-Brief Progress Note Patient Name: KHALEELAH YOWELL DOB: [birth year] MRN: [id number]   Date of Service  06/23/2020  HPI/Events of Note  Notified of glucose 200s. Previously hypoglycemic requiring D20 Creatinine 1.36  eICU Interventions  Will start low dose SSI     Intervention Category Major Interventions: Hyperglycemia - active titration of insulin therapy  Shona Needles [place] 06/23/2020, 4:21 AM

## 2020-06-23 NOTE — Progress Notes (Signed)
Sputum sample collected and walked to lab.

## 2020-06-23 NOTE — Progress Notes (Signed)
Advanced Heart Failure Rounding Note   Subjective:     Awake on vent. Following commands. On milrinone 0.125. Co-ox 58% last night. 90% this am (doubt accurate).   ABG 7.56/24/65/96% on 40%  Mild diuresis with IV lasix. Creatinine stable.   CXR with mild edema and small effusions  CTD serology CRP 9.2 ESR 2 rest pending    Objective:   Weight Range:  Vital Signs:   Temp:  [94.4 F (34.7 C)-99 F (37.2 C)] 98.1 F (36.7 C) (09/16 0915) Pulse Rate:  [100-163] 114 (09/16 0900) Resp:  [15-36] 28 (09/16 0915) BP: (79-149)/(54-119) 92/70 (09/16 0915) SpO2:  [92 %-100 %] 100 % (09/16 0915) FiO2 (%):  [40 %-100 %] 40 % (09/16 0442) Weight:  [65.3 kg-74.2 kg] 74.2 kg (09/16 0500)    Weight change: Filed Weights   06/22/20 1900 06/23/20 0500  Weight: 65.3 kg 74.2 kg    Intake/Output:   Intake/Output Summary (Last 24 hours) at 06/23/2020 0941 Last data filed at 06/23/2020 0900 Gross per 24 hour  Intake 4836.18 ml  Output 920 ml  Net 3916.18 ml     Physical Exam: General:  Awake on vent. Following commands HEENT: normal Neck: supple. JVP to ear . Carotids 2+ bilat; no bruits. No lymphadenopathy or thryomegaly appreciated. Cor: PMI nondisplaced. Tachy regular 2/6 TR + RV lift.. Lungs: soarse Abdomen: soft, nontender, ++ distended. No hepatosplenomegaly. No bruits or masses. Good bowel sounds. Extremities: no cyanosis, clubbing, rash, 2+ edema Neuro:awake on vent follows commands moves all 4   Telemetry: sinus tach Personally reviewed  Labs: Basic Metabolic Panel: Recent Labs  Lab 06/22/20 1139 06/22/20 1139 06/22/20 1140 06/22/20 1235 06/22/20 1358 06/22/20 1508 06/22/20 2110 06/22/20 2215 06/23/20 0534 06/23/20 0541  NA 132*   < > 133*   < >  --  133* 131* 136 133* 136  K 4.7   < > 4.7   < >  --  3.4* 3.5 3.4* 3.1* 3.2*  CL 103  --  105  --   --   --  102  --  99  --   CO2 10*  --   --   --   --   --  17*  --  23  --   GLUCOSE 146*  --  134*  --    --   --  337*  --  220*  --   BUN 16  --  19  --   --   --  15  --  16  --   CREATININE 1.38*  --  1.10*  --   --   --  1.36*  --  1.32*  --   CALCIUM 7.9*  --   --   --   --   --  7.1*  --  7.2*  --   MG  --   --   --   --  1.5*  --   --   --   --   --   PHOS  --   --   --   --  5.7*  --   --   --   --   --    < > = values in this interval not displayed.    Liver Function Tests: Recent Labs  Lab 06/22/20 1139 06/23/20 0534  AST 37 43*  ALT 15 20  ALKPHOS 112 91  BILITOT 2.9* 2.1*  PROT 6.9 5.2*  ALBUMIN 2.2* 1.7*  No results for input(s): LIPASE, AMYLASE in the last 168 hours. Recent Labs  Lab 06/22/20 1147  AMMONIA 157*    CBC: Recent Labs  Lab 06/22/20 1139 06/22/20 1140 06/22/20 1235 06/22/20 1508 06/22/20 2215 06/23/20 0534 06/23/20 0541  WBC 9.3  --   --   --   --  9.4  --   NEUTROABS 7.4  --   --   --   --   --   --   HGB 13.2   < > 15.0 13.3 16.0* 12.5 14.3  HCT 46.3*   < > 44.0 39.0 47.0* 38.3 42.0  MCV 111.0*  --   --   --   --  96.7  --   PLT 248  --   --   --   --  254  --    < > = values in this interval not displayed.    Cardiac Enzymes: No results for input(s): CKTOTAL, CKMB, CKMBINDEX, TROPONINI in the last 168 hours.  BNP: BNP (last 3 results) No results for input(s): BNP in the last 8760 hours.  ProBNP (last 3 results) No results for input(s): PROBNP in the last 8760 hours.    Other results:  Imaging: CT ANGIO HEAD W OR WO CONTRAST  Result Date: 06/22/2020 CLINICAL DATA:  Abdominal pain. Syncope, unresponsive. Hypotension. Hypoglycemia. Rule out dissection EXAM: CT ANGIOGRAPHY HEAD AND NECK TECHNIQUE: Multidetector CT imaging of the head and neck was performed using the standard protocol during bolus administration of intravenous contrast. Multiplanar CT image reconstructions and MIPs were obtained to evaluate the vascular anatomy. Carotid stenosis measurements (when applicable) are obtained utilizing NASCET criteria, using the  distal internal carotid diameter as the denominator. CONTRAST:  43m OMNIPAQUE IOHEXOL 350 MG/ML SOLN COMPARISON:  CT head 06/22/2020 FINDINGS: CTA NECK FINDINGS Aortic arch: Limited evaluation of the aortic arch. See separate CT angio chest report from today. Proximal great vessels widely patent. Right carotid system: Right carotid widely patent. Negative for stenosis or dissection. Left carotid system: Left carotid widely patent without stenosis or dissection. Vertebral arteries: Both vertebral arteries widely patent to the basilar without stenosis or dissection. Skeleton: No acute skeletal abnormality. Other neck: Right jugular central venous catheter. Soft tissue swelling right neck due to hematoma. There is gas in the subcutaneous tissues bilaterally which may be venous gas from prior line placement. There is gas in the central veins. Patient is intubated with endotracheal tube above the carina. Upper chest: Chest CT reported separately Review of the MIP images confirms the above findings CTA HEAD FINDINGS Anterior circulation: Cavernous carotid patent bilaterally without atherosclerotic disease or stenosis. Anterior and middle cerebral arteries are small in caliber diffusely likely due to hypotension. There is mild to moderate stenosis of the right middle cerebral artery. Mild to moderate stenosis superior division of the left middle cerebral artery. No large vessel occlusion or aneurysm. Posterior circulation: Small caliber posterior circulation likely due to hypotension. Both vertebral arteries patent to the basilar. Basilar patent. Superior cerebellar and posterior cerebral arteries are patent. Limited intracranial vascular evaluation due to small caliber vessels and decreased contrast enhancement. Venous sinuses: Minimal venous contrast due none to arterial phase scanning Anatomic variants: None Review of the MIP images confirms the above findings IMPRESSION: 1. No significant carotid or vertebral artery  stenosis in the neck 2. Small caliber intracranial circulation anteriorly and posteriorly likely due to hypotension. This limits evaluation. There is mild to moderate stenosis in the middle cerebral arteries bilaterally. No large  vessel occlusion or aneurysm identified. 3. Right jugular central venous catheter with probable surrounding hematoma. 4. Gas in the soft tissues the neck likely venous gas from IV infusion. 5. Endotracheal tube above the carina. Electronically Signed   By: Franchot Gallo M.D.   On: 06/22/2020 16:11   CT Head Wo Contrast  Result Date: 06/22/2020 CLINICAL DATA:  Unresponsive EXAM: CT HEAD WITHOUT CONTRAST TECHNIQUE: Contiguous axial images were obtained from the base of the skull through the vertex without intravenous contrast. COMPARISON:  01/16/2013 FINDINGS: Brain: No evidence of acute infarction, hemorrhage, hydrocephalus, extra-axial collection or mass lesion/mass effect. Vascular: No hyperdense vessel or unexpected calcification. Skull: Normal. Negative for fracture or focal lesion. Sinuses/Orbits: No acute finding. Other: None. IMPRESSION: No acute intracranial abnormality noted. Electronically Signed   By: Inez Catalina M.D.   On: 06/22/2020 12:42   DG Chest Port 1 View  Result Date: 06/23/2020 CLINICAL DATA:  Respiratory failure. EXAM: PORTABLE CHEST 1 VIEW COMPARISON:  July 17, 2007. FINDINGS: Stable cardiomegaly. Endotracheal and nasogastric tubes are in good position. Right internal jugular catheter is noted with tip in expected position of the SVC. No pneumothorax is noted. Mild reticular densities are noted throughout both lungs most consistent with pulmonary edema with small pleural effusions. Bony thorax is unremarkable. IMPRESSION: Stable support apparatus. Stable mild bilateral pulmonary edema with small pleural effusions. No pneumothorax is noted. Electronically Signed   By: Marijo Conception M.D.   On: 06/23/2020 08:18   DG Chest Portable 1 View  Result Date:  06/22/2020 CLINICAL DATA:  Central catheter placement.  Hypoxia. EXAM: PORTABLE CHEST 1 VIEW COMPARISON:  June 22, 2020 study obtained earlier in the day FINDINGS: Central catheter tip is in the superior vena cava. Central catheter tip is 2.7 cm above the carina. Nasogastric tube tip and side port are below the diaphragm. No pneumothorax. There is airspace consolidation in portions of each lower lobe. There is a loculated effusion on the right. There is diffuse interstitial thickening throughout the lungs bilaterally with interstitial and patchy airspace opacity throughout the mid and lower lung regions, slightly increased from earlier in the day. There is cardiomegaly with pulmonary vascularity normal. No adenopathy. No bone lesions. IMPRESSION: Tube and catheter positions as described without pneumothorax. Persistent loculated pleural effusion on the right. Consolidation in each lower lung region, increased. Widespread interstitial and patchy airspace opacity throughout the mid and lower lungs has progressed slightly. Electronically Signed   By: Lowella Grip III M.D.   On: 06/22/2020 13:48   DG Chest Port 1 View  Result Date: 06/22/2020 CLINICAL DATA:  Hypoxia EXAM: PORTABLE CHEST 1 VIEW COMPARISON:  June 23, 2019 FINDINGS: Endotracheal tube tip is 5.3 cm above the carina. Nasogastric tube tip and side port are below the diaphragm. No pneumothorax. There is apparent layering effusion on the right. There is diffuse interstitial thickening with suspected underlying fibrosis. There is no frank edema or airspace opacity. There is cardiomegaly with pulmonary vascularity normal. No adenopathy. No bone lesions. IMPRESSION: Tube positions as described without pneumothorax. Apparent layering effusion on the right. Cardiomegaly. No frank edema or consolidation. Underlying fibrosis present bilaterally. Electronically Signed   By: Lowella Grip III M.D.   On: 06/22/2020 12:16   EEG adult  Result  Date: 06/22/2020 [name], MD     06/23/2020  7:34 AM Patient Name: MAYRIN SCHMUCK MRN: [id number] Epilepsy Attending: [name] Referring Physician/Provider: Dr Audria Nine Date: 06/22/2020 Duration: 25.19 mins Patient history: 58o  F with ams. EEG to evaluate for seizure. Level of alertness: comatose AEDs during EEG study: None Technical aspects: This EEG study was done with scalp electrodes positioned according to the 10-20 International system of electrode placement. Electrical activity was acquired at a sampling rate of 500Hz and reviewed with a high frequency filter of 70Hz and a low frequency filter of 1Hz. EEG data were recorded continuously and digitally stored. Description: EEG showed continuous generalized 3 to 6 Hz theta-delta slowing admixed with 13-15Hz beta activity in frontocentral region. Hyperventilation and photic stimulation were not performed.   ABNORMALITY -Continuous slow, generalized IMPRESSION: This study is suggestive of severe diffuse encephalopathy, nonspecific etiology but likely related to sedation. No seizures or epileptiform discharges were seen throughout the recording. [name]   ECHOCARDIOGRAM COMPLETE BUBBLE STUDY  Result Date: 06/22/2020    ECHOCARDIOGRAM REPORT   Patient Name:   JENSINE LUZ Date of Exam: 06/22/2020 Medical Rec #:  [id number]       Height:       71.0 in Accession #:    [id number]      Weight:       143.8 lb Date of Birth:  [birth year]       BSA:          1.833 m Patient Age:    21 years        BP:           104/85 mmHg Patient Gender: F               HR:           109 bpm. Exam Location:  Inpatient Procedure: 2D Echo and Saline Contrast Bubble Study Indications:    stroke 434.91  History:        Patient has prior history of Echocardiogram examinations, most                 recent 06/23/2020. CHF, lupus; Arrythmias:Atrial Fibrillation.  Sonographer:    Johny Chess Referring Phys: [id number] [place]  1. Left  ventricular ejection fraction, by estimation, is 35 to 40%. The left ventricle has moderately decreased function. The left ventricle demonstrates global hypokinesis. Left ventricular diastolic parameters are consistent with Grade II diastolic dysfunction (pseudonormalization). There is the interventricular septum is flattened in systole and diastole, consistent with right ventricular pressure and volume overload.  2. Right ventricular systolic function is severely reduced. The right ventricular size is severely enlarged. Mildly increased right ventricular wall thickness. There is severely elevated pulmonary artery systolic pressure. The estimated right ventricular systolic pressure is 47.6 mmHg.  3. There is evidence of right to left shunting after 3-4 cardiac cycles suggestive of a PFO. . Evidence of atrial level shunting detected by color flow Doppler.  4. Right atrial size was severely dilated.  5. Moderate pericardial effusion.  6. The mitral valve is normal in structure. Mild mitral valve regurgitation.  7. The tricuspid valve is degenerative. Tricuspid valve regurgitation is severe.  8. The aortic valve is grossly normal. Aortic valve regurgitation is not visualized.  9. The inferior vena cava is dilated in size with <50% respiratory variability, suggesting right atrial pressure of 15 mmHg. FINDINGS  Left Ventricle: Left ventricular ejection fraction, by estimation, is 35 to 40%. The left ventricle has moderately decreased function. The left ventricle demonstrates global hypokinesis. The left ventricular internal cavity size was small. There is no left ventricular hypertrophy. The interventricular septum is flattened in systole and diastole, consistent with  right ventricular pressure and volume overload. Left ventricular diastolic parameters are consistent with Grade II diastolic dysfunction (pseudonormalization). Right Ventricle: The right ventricular size is severely enlarged. Mildly increased right  ventricular wall thickness. Right ventricular systolic function is severely reduced. There is severely elevated pulmonary artery systolic pressure. The tricuspid regurgitant velocity is 3.88 m/s, and with an assumed right atrial pressure of 15 mmHg, the estimated right ventricular systolic pressure is 73.4 mmHg. Left Atrium: Left atrial size was normal in size. Right Atrium: Right atrial size was severely dilated. Pericardium: A moderately sized pericardial effusion is present. Mitral Valve: The mitral valve is normal in structure. Mild mitral valve regurgitation. Tricuspid Valve: The tricuspid valve is degenerative in appearance. Tricuspid valve regurgitation is severe. Aortic Valve: The aortic valve is grossly normal. Aortic valve regurgitation is not visualized. Pulmonic Valve: The pulmonic valve was normal in structure. Pulmonic valve regurgitation is trivial. Aorta: The aortic root and ascending aorta are structurally normal, with no evidence of dilitation. Venous: The inferior vena cava is dilated in size with less than 50% respiratory variability, suggesting right atrial pressure of 15 mmHg. IAS/Shunts: Evidence of atrial level shunting detected by color flow Doppler. Agitated saline contrast was given intravenously to evaluate for intracardiac shunting.  LEFT VENTRICLE PLAX 2D LVIDd:         3.40 cm  Diastology LVIDs:         2.70 cm  LV e' medial: 2.72 cm/s LV PW:         1.20 cm LV IVS:        1.20 cm LVOT diam:     1.60 cm LV SV:         14 LV SV Index:   8 LVOT Area:     2.01 cm  RIGHT VENTRICLE         IVC TAPSE (M-mode): 0.7 cm  IVC diam: 2.50 cm LEFT ATRIUM             Index       RIGHT ATRIUM           Index LA diam:        2.70 cm 1.47 cm/m  RA Area:     26.90 cm LA Vol (A2C):   34.5 ml 18.82 ml/m RA Volume:   93.70 ml  51.13 ml/m LA Vol (A4C):   34.8 ml 18.99 ml/m LA Biplane Vol: 37.4 ml 20.41 ml/m  AORTIC VALVE LVOT Vmax:   49.60 cm/s LVOT Vmean:  37.400 cm/s LVOT VTI:    0.069 m  AORTA Ao  Root diam: 3.30 cm Ao Asc diam:  2.90 cm TRICUSPID VALVE TR Peak grad:   60.2 mmHg TR Vmax:        388.00 cm/s  SHUNTS Systemic VTI:  0.07 m Systemic Diam: 1.60 cm Mertie Moores MD Electronically signed by Mertie Moores MD Signature Date/Time: 06/22/2020/5:45:40 PM    Final    CT Angio Chest/Abd/Pel for Dissection W and/or W/WO  Result Date: 06/22/2020 CLINICAL DATA:  Unresponsive with hypotension. Lactic acidosis, elevated troponin and history of prior DVT and pulmonary embolism. History of rheumatoid arthritis with interstitial lung disease and pulmonary fibrosis. EXAM: CT ANGIOGRAPHY CHEST, ABDOMEN AND PELVIS TECHNIQUE: Non-contrast CT of the chest was initially obtained. Multidetector CT imaging through the chest, abdomen and pelvis was performed using the standard protocol during bolus administration of intravenous contrast. Multiplanar reconstructed images and MIPs were obtained and reviewed to evaluate the vascular anatomy. CONTRAST:  53m OMNIPAQUE IOHEXOL 350  MG/ML SOLN COMPARISON:  CTA of the chest and CT of the abdomen and pelvis on 06/23/2019 FINDINGS: CTA CHEST FINDINGS Cardiovascular: Initial noncontrast CT does contain some contrast due to the fact that a CTA of the head and neck has been performed prior to the body CTA study. The thoracic aorta is of normal caliber and demonstrates no evidence of dissection or significant atherosclerosis. Visualized proximal great vessels demonstrate normal patency. The pulmonary arteries are also well opacified on the study. There is no evidence of acute or chronic pulmonary embolism. Again noted is significant cardiac enlargement primarily involving right-sided chambers with massive dilatation of the right atrium and significant right ventricular dilatation. Associated prominent reflux of contrast into the intrahepatic IVC and hepatic veins is consistent with right heart failure and elevated right heart pressures. There is a trace amount of pericardial fluid. No  significant calcified coronary artery plaque identified. Mediastinum/Nodes: Degree of lymph node enlargement involving supraclavicular, subpectoral, paratracheal, prevascular and AP window lymph nodes is similar to the prior study but potentially slightly worse. The largest right paratracheal lymph node measures approximately 1.6 cm in short axis. These are all presumably reactive lymph nodes related to underlying chronic lung disease but underlying lymphoproliferative disease is not entirely excluded. Endotracheal tube extends to the distal trachea. Gastric decompression tube extends below the diaphragm. Lungs/Pleura: Moderate right and small left pleural effusions present. Associated atelectasis of both lower lobes, right greater than left. Underlying chronic cystic and fibrotic lung disease with probable component of pulmonary edema. No pneumothorax. Musculoskeletal: No chest wall abnormality. No acute or significant osseous findings. Review of the MIP images confirms the above findings. CTA ABDOMEN AND PELVIS FINDINGS VASCULAR Aorta: The abdominal aorta demonstrates normal patency without evidence of aneurysm or dissection. No significant atherosclerosis. Celiac: Normally patent.  Distal branch vessels are patent. SMA: Normally patent. Renals: Normally patent bilateral single renal arteries. IMA: Normally patent. Inflow: Normally patent bilateral common, internal and external iliac arteries. Normally patent bilateral common femoral arteries and femoral bifurcations. Review of the MIP images confirms the above findings. NON-VASCULAR Hepatobiliary: Liver contour is somewhat nodular and there may be a component of underlying cirrhosis. No obvious hepatic masses. The gallbladder is surrounded by some free fluid with ascites also present elsewhere in the peritoneal cavity. Pancreas: Limited evaluation on the arterial phase with no gross abnormality identified. Spleen: Normal in size without focal abnormality.  Adrenals/Urinary Tract: Arterial phase demonstrates no gross renal abnormalities. No hydronephrosis. The bladder contains a Foley catheter and is decompressed. Stomach/Bowel: There is suggestion of diffuse bowel wall congestion and edema without obstruction. No free intraperitoneal air identified. Lymphatic: No enlarged lymph nodes identified in the abdomen or pelvis. Reproductive: Uterus and bilateral adnexa are unremarkable. Other: Small volume ascites scattered throughout the peritoneal cavity. Severe anasarca involving the body wall. No discrete abscess is seen. Musculoskeletal: No acute or significant osseous findings. Review of the MIP images confirms the above findings. IMPRESSION: 1. No evidence of aortic dissection or pulmonary embolism. 2. Significant cardiac enlargement primarily involving right-sided chambers with massive dilatation of the right atrium and significant right ventricular dilatation. Associated prominent reflux of contrast into the intrahepatic IVC and hepatic veins is consistent with right heart failure and elevated right heart pressures. 3. Moderate right and small left pleural effusions with associated atelectasis of both lower lobes, right greater than left. 4. Underlying chronic cystic and fibrotic lung disease with probable component of pulmonary edema. 5. Mediastinal lymphadenopathy which is similar to the prior study but  potentially slightly worse compared to the prior study. These are all presumably reactive lymph nodes related to underlying chronic lung disease but underlying lymphoproliferative disease is not entirely excluded. 6. Small volume ascites scattered throughout the peritoneal cavity. 7. Severe anasarca involving the body wall. 8. Suggestion of diffuse bowel wall congestion and edema without obstruction or perforation. 9. Somewhat nodular contour of the liver suggestive of underlying cirrhosis. Electronically Signed   By: Aletta Edouard M.D.   On: 06/22/2020 16:29    CT ANGIO NECK CODE STROKE  Result Date: 06/22/2020 CLINICAL DATA:  Abdominal pain. Syncope, unresponsive. Hypotension. Hypoglycemia. Rule out dissection EXAM: CT ANGIOGRAPHY HEAD AND NECK TECHNIQUE: Multidetector CT imaging of the head and neck was performed using the standard protocol during bolus administration of intravenous contrast. Multiplanar CT image reconstructions and MIPs were obtained to evaluate the vascular anatomy. Carotid stenosis measurements (when applicable) are obtained utilizing NASCET criteria, using the distal internal carotid diameter as the denominator. CONTRAST:  77m OMNIPAQUE IOHEXOL 350 MG/ML SOLN COMPARISON:  CT head 06/22/2020 FINDINGS: CTA NECK FINDINGS Aortic arch: Limited evaluation of the aortic arch. See separate CT angio chest report from today. Proximal great vessels widely patent. Right carotid system: Right carotid widely patent. Negative for stenosis or dissection. Left carotid system: Left carotid widely patent without stenosis or dissection. Vertebral arteries: Both vertebral arteries widely patent to the basilar without stenosis or dissection. Skeleton: No acute skeletal abnormality. Other neck: Right jugular central venous catheter. Soft tissue swelling right neck due to hematoma. There is gas in the subcutaneous tissues bilaterally which may be venous gas from prior line placement. There is gas in the central veins. Patient is intubated with endotracheal tube above the carina. Upper chest: Chest CT reported separately Review of the MIP images confirms the above findings CTA HEAD FINDINGS Anterior circulation: Cavernous carotid patent bilaterally without atherosclerotic disease or stenosis. Anterior and middle cerebral arteries are small in caliber diffusely likely due to hypotension. There is mild to moderate stenosis of the right middle cerebral artery. Mild to moderate stenosis superior division of the left middle cerebral artery. No large vessel occlusion or  aneurysm. Posterior circulation: Small caliber posterior circulation likely due to hypotension. Both vertebral arteries patent to the basilar. Basilar patent. Superior cerebellar and posterior cerebral arteries are patent. Limited intracranial vascular evaluation due to small caliber vessels and decreased contrast enhancement. Venous sinuses: Minimal venous contrast due none to arterial phase scanning Anatomic variants: None Review of the MIP images confirms the above findings IMPRESSION: 1. No significant carotid or vertebral artery stenosis in the neck 2. Small caliber intracranial circulation anteriorly and posteriorly likely due to hypotension. This limits evaluation. There is mild to moderate stenosis in the middle cerebral arteries bilaterally. No large vessel occlusion or aneurysm identified. 3. Right jugular central venous catheter with probable surrounding hematoma. 4. Gas in the soft tissues the neck likely venous gas from IV infusion. 5. Endotracheal tube above the carina. Electronically Signed   By: CFranchot GalloM.D.   On: 06/22/2020 16:11      Medications:     Scheduled Medications: . chlorhexidine gluconate (MEDLINE KIT)  15 mL Mouth Rinse BID  . Chlorhexidine Gluconate Cloth  6 each Topical Daily  . digoxin  0.125 mg Per Tube Daily  . furosemide  80 mg Intravenous BID  . lactulose  30 g Per Tube TID  . mouth rinse  15 mL Mouth Rinse 10 times per day  . metoprolol tartrate      .  pantoprazole (PROTONIX) IV  40 mg Intravenous Daily  . potassium chloride         Infusions: . dextrose Stopped (06/22/20 1940)  . fentaNYL infusion INTRAVENOUS 100 mcg/hr (06/22/20 1206)  . methylPREDNISolone (SOLU-MEDROL) injection Stopped (06/22/20 2331)  . milrinone 0.125 mcg/kg/min (06/23/20 0900)  . sodium bicarbonate (isotonic) 150 mEq in D5W 1000 mL infusion 125 mL/hr at 06/23/20 0900     PRN Medications:  docusate, fentaNYL, insulin aspart, metoprolol tartrate, midazolam, midazolam,  polyethylene glycol    Plan/Discussion:    1. 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  - RH cath in 12/20 with mild PAH with normal RV function. - Echo 06/22/20 Echo LV small compressed from RV EF 45-50% RV massively dilated and severely hypokinetic. Severe TR RVSP 76 Severe D-sign. Moderate pericardial effusion  - PFTs much better than I expected based on CT chest. 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 - She may also have component of CTEPH with recent PE but CT this admit negative for acute PE. Will eventually need repeat RHC and VQ - She looks better today but still with evidence of severe RHF and volume overload. I d/w Dr. Abigail Butts at bedside.  - Continue support RV with milrinone and digoxin. Push diuresis  - Initial CTD serology a bit underwhelming but I am still concerned for a flare as the unifying diagnosis  - Continue solumedrol 1g burst x 3 days.  - Plan RHC next week to further adjust meds.  - Can use iNO as needed though given degree of underlying lung disease have to watch closely for shunting  2. Acute hypoxic respiratory failure - multifactorial including PAH, ILD, large R effusion, pulmonary edema and possible aspiration  - CCM managing - support RV as above.  - continue milrinone and diuresis  3. 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 On lactulose  4. Profound hypoglycemia - suspect related to liver failure  5. CTD with h/o RA and SLE - As above, I am concerned about possible CTD flare. Will treat with solumedrol 1 gram burst x 3 days for now - follow serology  6. DVT/PE - diagnosed 6/21 at [facility] - CT this admit. Negative for acute PE - start heparin   CRITICAL CARE Performed by: Glori Bickers  Total critical  care time: 45 minutes  Critical care time was exclusive of separately billable procedures and treating other patients.  Critical care was necessary to treat or prevent imminent or life-threatening deterioration.  Critical care was time spent personally by me (independent of midlevel providers or residents) on the following activities: development of treatment plan with patient and/or surrogate as well as nursing, discussions with consultants, evaluation of patient's response to treatment, examination of patient, obtaining history from patient or surrogate, ordering and performing treatments and interventions, ordering and review of laboratory studies, ordering and review of radiographic studies, pulse oximetry and re-evaluation of patient's condition.    Length of Stay: 1   Glori Bickers 06/23/2020, 9:41 AM  Advanced Heart Failure Team Pager [phone] (M-F; 7a - 4p)  Please contact [facility] for night-coverage after hours (4p -7a ) and weekends on [email or web address]

## 2020-06-23 NOTE — Progress Notes (Signed)
Initial Nutrition Assessment  DOCUMENTATION CODES:   Not applicable  INTERVENTION:   Initiate tube feeding via OG tube: Vital 1.5 at 45 ml/h (1080 ml per day) Prosource TF 90 ml BID  Provides 1780 kcal, 117 gm protein, 820 ml free water daily   NUTRITION DIAGNOSIS:   Inadequate oral intake related to inability to eat as evidenced by NPO status.  GOAL:   Patient will meet greater than or equal to 90% of their needs  MONITOR:   TF tolerance, I & O's  REASON FOR ASSESSMENT:   Consult, Ventilator Enteral/tube feeding initiation and management  ASSESSMENT:   Pt with PMH of PAF, DVT, PE on Eliquis, RA, HF, CKD stage III, cirrhosis with ESLD, centrilobular emphysema, pulmonary fibrosis on daily prednisone, and chronic venous insufficiency with chronic foot ulcers.Admitted with cardiogenic shock due to severe RV failure/cor pulmonale CTD flare.     Per chart review since June 2021 patient has been struggling with weight loss, early satiety, muscle weakness, and ongoing lower leg edema/ wounds  Patient is currently intubated on ventilator support MV: 7 L/min Temp (24hrs), Avg:97.7 F (36.5 C), Min:94.4 F (34.7 C), Max:99 F (37.2 C)   Medications reviewed and include: 80 mg lasix BID, SSI, 3 units novolog every 4 hours, 30 g lactulose TID, solumedrol Milrinone N[place]icarb  Labs reviewed: Na 134 CBG: 186-151   16 F OG tube; per chest xray tip of tube below diaphragm  Diet Order:   Diet Order            Diet NPO time specified  Diet effective now                 EDUCATION NEEDS:   No education needs have been identified at this time  Skin:  Skin Assessment: Reviewed RN Assessment  Last BM:  unknown, abd distended per RN  Height:   Ht Readings from Last 1 Encounters:  06/22/20 5\' 11"  (1.803 m)    Weight:   Wt Readings from Last 1 Encounters:  06/23/20 74.2 kg    Ideal Body Weight:  70.4 kg  BMI:  Body mass index is 22.81 kg/m.  Estimated  Nutritional Needs:   Kcal:  1700-1900  Protein:  100-130 grams  Fluid:  > 1.5 L/day  Lockie Pares., RD, LDN, CNSC See AMiON for contact information

## 2020-06-23 NOTE — Progress Notes (Addendum)
NAME:  Carolyn Proctor, MRN:  [id number], DOB:  1960/12/06, LOS: 1 ADMISSION DATE:  06/22/2020, CONSULTATION DATE:  06/22/2020 REFERRING MD:  Dr. Tyrone Nine, CHIEF COMPLAINT:  AMS/ refractory hypoglycemia  Brief History   72 yoF at home who complained to family she felt like she was having a stroke, then developed left facial droop and went unresponsive.  Required BVM by EMS and noted to be hypoglycemic and hypotensive.  Intubated in ER for airway protection.  CTH negative.  Refractory hypoglycemia despite multiple D50 amps now on drip.  PCCM called to admit.   History of present illness   HPI obtained from medical chart review and daughter, [name] at beside, as patient is currently intubated and sedated on mechanical ventilation.  59 year old female with prior history of PAF, DVT, and PE on Eliquis, RA,  hypercoagulable state (increased protein S antigen, decreased protein C activity, and decreased Antithrombin III), HFpEF, CKD stage III, centrilobular emphysema and pulmonary fibrosis on daily prednisone- never smoker, chronic venous insufficiency w/ chronic foot ulcers, and diverticulitis.   Daughter reports patient has not felt well since her hospitalization in June at [facility] where she was diagnosed with PE/ DVT and placed on Eliquis in which daughter states she is compliant with.  Since, patient also has been struggling with weight loss, early satiety, muscle weakness, and ongoing lower leg edema/ wounds.  Seen by GI and put on miralax per daughter for constipation.  Daughter also reports her prednisone has been recently tapered.    Patient was at home this morning around 1020, when she reported to her family that she felt like she was having a stroke. Family noticed a left facial droop and then patient proceeded to become unresponsive.  On EMS arrival, patient required continuous BVM. CBG noted 49, afebrile and was hypotensive requiring fluid bolus.  On arrival to ER, she remained altered  and required intubation for airway protection. 2L NS bolus given for hypotension.  She remained hypoglycemic despite multiple D50 requiring D10 gtt.  A right IJ central line was placed in ER. Labs noted for normal WBC/ Hgb, Na 132, CO2 10, sCr 1.38, AG 19, albumin 2.2, normal AST/ ALT, t. Bili 2.9, trop hs 34, LA 10.8, ETOH neg, ammonia 157. CT head without acute process.  CXR showing satisfactory position of ETT, layering right pleural effusion, diffuse interstitial thickening c/w hx of fibrosis.  ABG post intubation 6.954/ 43.4/ 31.3/ 9.6.  Patient remained normotensive but temp 95 requiring bairhugger.  PCCM called for admit.   Past Medical History  PAF, DVT, PE on Eliquis HFpEF CKD stage III RA Hypercoagulable state (increased protein S antigen, decreased protein C activity, and decreased Antithrombin III) Centrilobular emphysema and pulmonary fibrosis- non smoker Chronic venous insufficiency w/ chronic foot ulcers  Diverticulitis   Significant Hospital Events   9/15 admitted/ intubated  Consults:   Procedures:  9/15 ETT >> 9/15 R IJ TL CVC >> 9/15 OGT/ foley  Significant Diagnostic Tests:  9/15 CTH >> negative  9/15 CXR >> ETT 5.3 above carina, layering right pleural effusion, diffuse interstitial thickening c/w hx of fibrosis  9/15 CTA chest/ abd/ pelvis >> significant dilatation of RA and RV consistent with R heart failure; moderate R and small L pleural effusions with associated atelectasis of both lower lobes; underlying chronic cystic and fibrotic lung disease; severe anasarca; nodular contour of liver suggestive of possible cirrhosis 9/15 CTA head and neck >> no significant carotid or vertebral artery stenosis in neck,  no LVO, right jugular central venous catheter with probable surrounding hematoma  9/16 CXR >> stable mild b/l pulmonary edema with small pleural effusions. No PTX noted  Micro Data:  9/15 BCx2 >> 9/15 SARS2 >> neg 9/15 UC >>  Antimicrobials:  9/15 vanc  >> 9/15 zosyn >>  Interim history/subjective:  Awake on vent, able to follow some simple commands.  Starting tube feeds today.  ADDENDUM: patient with sudden onset O2 desaturation, necessitating bagging with PEEP of 20. R fem art line placed. ABG showing pH 7.26 and PaO2 358. The sudden nature of this desaturation, patient's history of recent DVT/PE, and no significant changes noted on CXR are concerning for the possibility of a PE as the cause. Patient is back on vent and stable at this time. Saturation has increased back to high 90s so will hold off on thrombolytic at this time.   Objective   Blood pressure 92/70, pulse (!) 114, temperature 98.1 F (36.7 C), resp. rate (!) 28, height _0  (1.803 m), weight 74.2 kg, last menstrual period 02/19/2013, SpO2 100 %.    Vent Mode: PRVC FiO2 (%):  [40 %-100 %] 40 % Set Rate:  [16 bmp-28 bmp] 28 bmp Vt Set:  [500 mL-570 mL] 500 mL PEEP:  [5 cmH20] 5 cmH20 Plateau Pressure:  [21 cmH20-32 cmH20] 21 cmH20   Intake/Output Summary (Last 24 hours) at 06/23/2020 1115 Last data filed at 06/23/2020 0900 Gross per 24 hour  Intake 4836.18 ml  Output 920 ml  Net 3916.18 ml   Filed Weights   06/22/20 1900 06/23/20 0500  Weight: 65.3 kg 74.2 kg    Examination: General:  Critically ill appearing adult female, intubated and mechanically ventilated, in NAD. HEENT: mucous membranes slightly pale and dry, ETT and OGT in place. Pupils sluggishly reactive. Neuro: Awake and arousable. Able to nod head in response to pain. CV: normal rate and regular rhythm. Systolic murmur heard. PULM:  Mechanically ventilated breath sounds, bibasilar rales heard. GI: abdomen firm and slightly distended. Hypoactive bowel sounds noted. Extremities: cool and dry, +3 edema in bilateral lower extremities (R>L) Skin: area of redness on right shin  Resolved Hospital Problem list    Assessment & Plan:   Acute hypoxic respiratory failure Chronic Interstitial Lung Disease  2/2 RA vs IPF Looks better today, but still showing evidence of RHF and volume overload. - full MV support, PRVC 6-8 cc/kg, FiO2 40% - ABG: pH 7.56 / pCO2 23.6 / pO2 65 / HCO3 21.1 - ABG indicative of effective hyperventilation  - wean FiO2 for sat goal > 90 - VAP bundle/ PPI   - CXR stable today - started on milrinone - will give lasix 82m BID - on day 2 of stress dose steroids (solumedrol 1g burst x 3 days) for possible acute CTD flare, last dose tomorrow, will taper steroids afterwards. - ESR 2, CRP 9.2 -f/u sputum cultures -on vanc/zosyn empirically until negative cultures  Cardiogenic shock 2/2 severe RV failure/cor pulmonale Hx of HFpEF, HTN - cardiology following - ECHO 9/15 showing LVEF 45-50% with compression of LV from RV; RV massively dilated and severely hypokinetic; severe tricuspid regurg with RVSP 76; moderate pericardial effusion - CT chest/abd/pel showing significant dilatation of RA and RV consistent with R heart failure; moderate R and small L pleural effusions with associated atelectasis of both lower lobes; underlying chronic cystic and fibrotic lung disease - goal map > 65 - f/u blood cx and urine cx - RV support with milrinone and diuresis (lasix  61m BID) - started levophed for BP support  Refractory hypoglycemia likely 2/2 liver failure - CBGs elevated to 200s while on D20 infusion and receiving stress dose steroids -will D/C D20 infusion at this time and monitor -SSI  Encephalopathy, w/ initial stroke symptoms most likely attributed to hypoglycemia but given hypercoagulable hx needs further workup.  CTH neg for acute changes or bleed.  - CTA head/neck not showing any acute findings, consider MRI when stable - EEG showing severe diffuse encephalopathy, nonspecific etiology - UDS positive for benzodiazepines - TSH 10.5 - ammonia 157 - serial neuro checks  Severe AGMA/ lactic acidosis  CKD stage III,  baseline sCr ~1.15- 1.39 - Cr 1.45 today - foley in  place - continue bicarb drip - trend BMP - strict I/Os, trend renal indices  - volatile panel, tylenol and salicylate levels all negative  Hyperammoniemia, unclear etiology given normal LFTs except t.bili 2.9,  - CT abd/ pelvis showing somewhat nodular contour of liver, suggestive of possible cirrhosis - continue lactulose - trend LFTs  Hx DVT, PE, and +hypercoagulable workup - checking coags - started heparin ppx today  Hx PAF - currently ST - tele monitoring  - holding home cardizem - started heparin today   Prolonged QT - avoid QTc prolonging agents - tele monitoring  Best practice:  Diet: NPO, TF Pain/Anxiety/Delirium protocol (if indicated): fentanyl/versed VAP protocol (if indicated): yes DVT prophylaxis: Heparin per pharmacy GI prophylaxis: PPI Glucose control: hourly and prn still stable Mobility: bedrest Code Status: full  Family Communication: Daughter, MAlwyn [phone] updated at bedside Disposition: ICU  Labs   CBC: Recent Labs  Lab 06/22/20 1139 06/22/20 1140 06/22/20 1235 06/22/20 1508 06/22/20 2215 06/23/20 0534 06/23/20 0541  WBC 9.3  --   --   --   --  9.4  --   NEUTROABS 7.4  --   --   --   --   --   --   HGB 13.2   < > 15.0 13.3 16.0* 12.5 14.3  HCT 46.3*   < > 44.0 39.0 47.0* 38.3 42.0  MCV 111.0*  --   --   --   --  96.7  --   PLT 248  --   --   --   --  254  --    < > = values in this interval not displayed.    Basic Metabolic Panel: Recent Labs  Lab 06/22/20 1139 06/22/20 1139 06/22/20 1140 06/22/20 1235 06/22/20 1358 06/22/20 1508 06/22/20 2110 06/22/20 2215 06/23/20 0534 06/23/20 0541  NA 132*   < > 133*   < >  --  133* 131* 136 133* 136  K 4.7   < > 4.7   < >  --  3.4* 3.5 3.4* 3.1* 3.2*  CL 103  --  105  --   --   --  102  --  99  --   CO2 10*  --   --   --   --   --  17*  --  23  --   GLUCOSE 146*  --  134*  --   --   --  337*  --  220*  --   BUN 16  --  19  --   --   --  15  --  16  --   CREATININE 1.38*  --   1.10*  --   --   --  1.36*  --  1.32*  --  CALCIUM 7.9*  --   --   --   --   --  7.1*  --  7.2*  --   MG  --   --   --   --  1.5*  --   --   --   --   --   PHOS  --   --   --   --  5.7*  --   --   --   --   --    < > = values in this interval not displayed.   GFR: Estimated Creatinine Clearance: 53.2 mL/min (A) (by C-G formula based on SCr of 1.32 mg/dL (H)). Recent Labs  Lab 06/22/20 1139 06/22/20 1147 06/22/20 1429 06/23/20 0534  PROCALCITON  --   --   --  11.22  WBC 9.3  --   --  9.4  LATICACIDVEN  --  10.8* 5.8*  --     Liver Function Tests: Recent Labs  Lab 06/22/20 1139 06/23/20 0534  AST 37 43*  ALT 15 20  ALKPHOS 112 91  BILITOT 2.9* 2.1*  PROT 6.9 5.2*  ALBUMIN 2.2* 1.7*   No results for input(s): LIPASE, AMYLASE in the last 168 hours. Recent Labs  Lab 06/22/20 1147  AMMONIA 157*    ABG    Component Value Date/Time   PHART 7.557 (H) 06/23/2020 0541   PCO2ART 23.6 (L) 06/23/2020 0541   PO2ART 65 (L) 06/23/2020 0541   HCO3 21.1 06/23/2020 0541   TCO2 22 06/23/2020 0541   ACIDBASEDEF 6.0 (H) 06/22/2020 2215   O2SAT 96.0 06/23/2020 0541     Coagulation Profile: Recent Labs  Lab 06/22/20 1358 06/23/20 0534  INR 4.7* 2.9*    Cardiac Enzymes: No results for input(s): CKTOTAL, CKMB, CKMBINDEX, TROPONINI in the last 168 hours.  HbA1C: Hgb A1c MFr Bld  Date/Time Value Ref Range Status  06/23/2020 05:34 AM 5.5 4.8 - 5.6 % Final    Comment:    (NOTE) Pre diabetes:          5.7%-6.4%  Diabetes:              >6.4%  Glycemic control for   <7.0% adults with diabetes   07/17/2007 02:46 PM   Final   5.3 (NOTE)   The ADA recommends the following therapeutic goals for glycemic   control related to Hgb A1C measurement:   Goal of Therapy:   < 7.0% Hgb A1C   Action Suggested:  > 8.0% Hgb A1C   Ref:  Diabetes Care, 22, Suppl. 1, 1999    CBG: Recent Labs  Lab 06/23/20 0218 06/23/20 0303 06/23/20 0402 06/23/20 0813 06/23/20 1028  GLUCAP 208* 229*  216* 186* 151*    Review of Systems:   Unable   Past Medical History  She,  has a past medical history of Acute myopericarditis (02/24/2013), Atrial fibrillation ([place]), Atrial fibrillation with RVR ([place]) (02/22/2013), Chronic anticoagulation, Xarelto (04/02/2013), Chronic diastolic CHF (congestive heart failure) ([place]) (06/26/2019), Diverticulitis, Elevated troponin- secondary to Myopericarditis (02/25/2013), Emphysema  (07/07/2019), Hypertension, Lupus ([place]) (09/14/2011), 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   Surgical History    Past Surgical History:  Procedure Laterality Date   BONE MARROW BIOPSY  2010   CARDIAC CATHETERIZATION     LEFT HEART CATHETERIZATION WITH CORONARY ANGIOGRAM N/A 02/24/2013   Procedure: LEFT HEART CATHETERIZATION WITH CORONARY ANGIOGRAM;  Surgeon: Leonie Man, MD;  Location: [facility] CATH LAB;  Service: Cardiovascular;  Laterality: N/A;   RIGHT/LEFT HEART CATH AND CORONARY ANGIOGRAPHY N/A 09/09/2019   Procedure: RIGHT/LEFT HEART CATH AND CORONARY ANGIOGRAPHY;  Surgeon: Jettie Booze, MD;  Location: [place] CV LAB;  Service: Cardiovascular;  Laterality: N/A;     Social History   reports that she has never smoked. She has never used smokeless tobacco. She reports that she does not drink alcohol and does not use drugs.   Family History   Her family history includes Hypertension in her maternal grandmother; Liver disease in her mother; Lupus in her brother and mother.   Allergies No Known Allergies   Home Medications  Prior to Admission medications   Medication Sig  Start Date End Date Taking? Authorizing Provider  albuterol (VENTOLIN HFA) 108 (90 Base) MCG/ACT inhaler Inhale 2 puffs into the lungs every 6 (six) hours as needed for wheezing or shortness of breath. 06/26/19   [place] Donnamarie Poag, DO  Calcium Carbonate-Vit D-Min (CALCIUM 1200 PO) Take 1 capsule by mouth daily.    [provider]  Cholecalciferol (VITAMIN D) 50 MCG (2000 UT) tablet Take 2,000 Units by mouth daily.     [provider]  Cyanocobalamin (VITAMIN B-12) 1000 MCG SUBL Place 1,000 mcg under the tongue.    [provider]  furosemide (LASIX) 20 MG tablet Take 1 tablet (20 mg total) by mouth 3 (three) times a week. 09/23/19   Jettie Booze, MD  pantoprazole (PROTONIX) 40 MG tablet Take 1 tablet (40 mg total) by mouth daily. 09/28/19 12/27/19  Margaretha Seeds, MD  predniSONE ([place] Y) 5 MG tablet TAKE 2 TABLETS BY MOUTH EVERY DAY 01/18/20   Margaretha Seeds, MD     Critical care time: 63 mins     Virl Axe, MD 06/23/2020, 11:15 AM Pager: [phone]

## 2020-06-23 NOTE — Progress Notes (Signed)
PHARMACY - PHYSICIAN COMMUNICATION CRITICAL VALUE ALERT - BLOOD CULTURE IDENTIFICATION (BCID)  Carolyn Proctor is an 59 y.o. female who presented to [facility] on 06/22/2020 with a chief complaint of unresponsiveness  Assessment: 72 YOF with acute resp failure and found to have cardiogenic shock. Now with 1 of 4 blood cultures growing GPC with BCID detecting Strep species - likely representative of a contaminant.   Name of physician (or Provider) Contacted: Agarwala  Current antibiotics: Vanc x1 and Zosyn x 1 in the ED on 9/15, no standing antibiotic orders  Changes to prescribed antibiotics recommended:  None for BCID. MD wants to restart Vancomycin + Zosyn for PNA coverage.   Results for orders placed or performed during the hospital encounter of 06/22/20  Blood Culture ID Panel (Reflexed) (Collected: 06/22/2020 12:11 PM)  Result Value Ref Range   Enterococcus faecalis NOT DETECTED NOT DETECTED   Enterococcus Faecium NOT DETECTED NOT DETECTED   Listeria monocytogenes NOT DETECTED NOT DETECTED   Staphylococcus species NOT DETECTED NOT DETECTED   Staphylococcus aureus (BCID) NOT DETECTED NOT DETECTED   Staphylococcus epidermidis NOT DETECTED NOT DETECTED   Staphylococcus lugdunensis NOT DETECTED NOT DETECTED   Streptococcus species DETECTED (A) NOT DETECTED   Streptococcus agalactiae NOT DETECTED NOT DETECTED   Streptococcus pneumoniae NOT DETECTED NOT DETECTED   Streptococcus pyogenes NOT DETECTED NOT DETECTED   A.calcoaceticus-baumannii NOT DETECTED NOT DETECTED   Bacteroides fragilis NOT DETECTED NOT DETECTED   Enterobacterales NOT DETECTED NOT DETECTED   Enterobacter cloacae complex NOT DETECTED NOT DETECTED   Escherichia coli NOT DETECTED NOT DETECTED   Klebsiella aerogenes NOT DETECTED NOT DETECTED   Klebsiella oxytoca NOT DETECTED NOT DETECTED   Klebsiella pneumoniae NOT DETECTED NOT DETECTED   Proteus species NOT DETECTED NOT DETECTED   Salmonella species NOT DETECTED NOT  DETECTED   Serratia marcescens NOT DETECTED NOT DETECTED   Haemophilus influenzae NOT DETECTED NOT DETECTED   Neisseria meningitidis NOT DETECTED NOT DETECTED   Pseudomonas aeruginosa NOT DETECTED NOT DETECTED   Stenotrophomonas maltophilia NOT DETECTED NOT DETECTED   Candida albicans NOT DETECTED NOT DETECTED   Candida auris NOT DETECTED NOT DETECTED   Candida glabrata NOT DETECTED NOT DETECTED   Candida krusei NOT DETECTED NOT DETECTED   Candida parapsilosis NOT DETECTED NOT DETECTED   Candida tropicalis NOT DETECTED NOT DETECTED   Cryptococcus neoformans/gattii NOT DETECTED NOT DETECTED    Thank you for allowing pharmacy to be a part of this patient's care.  Alycia Rossetti, PharmD, BCPS Clinical Pharmacist Clinical phone for 06/23/2020: [phone] 06/23/2020 9:27 AM   **Pharmacist phone directory can now be found on [email or web address] ([email or web address]).  Listed under [place].

## 2020-06-23 NOTE — Progress Notes (Signed)
Called about doing stat LTM; pt too unstable at this time, multiple nurses in room.

## 2020-06-23 NOTE — Progress Notes (Signed)
LTM started; no initial skin breakdown was seen. Pt being monitored by [facility], event button tested, nurse educated on event button.

## 2020-06-23 NOTE — Progress Notes (Signed)
ANTICOAGULATION CONSULT NOTE - Initial Consult  Pharmacy Consult for Heparin Indication: Recent DVT/PE (June 2021), Afib, protein C deficiency  No Known Allergies  Patient Measurements: Height: 5\' 11"  (180.3 cm) Weight: 74.2 kg (163 lb 9.3 oz) IBW/kg (Calculated) : 70.8 Heparin Dosing Weight: 74.2 kg  Vital Signs: Temp: 98.1 F (36.7 C) (09/16 0915) BP: 92/70 (09/16 0915) Pulse Rate: 114 (09/16 0900)  Labs: Recent Labs    06/22/20 1139 06/22/20 1139 06/22/20 1140 06/22/20 1235 06/22/20 1358 06/22/20 1508 06/22/20 2110 06/22/20 2215 06/22/20 2215 06/23/20 0534 06/23/20 0541  HGB 13.2   < > 16.7*   < >  --    < >  --  16.0*   < > 12.5 14.3  HCT 46.3*   < > 49.0*   < >  --    < >  --  47.0*  --  38.3 42.0  PLT 248  --   --   --   --   --   --   --   --  254  --   LABPROT  --   --   --   --  42.6*  --   --   --   --  29.6*  --   INR  --   --   --   --  4.7*  --   --   --   --  2.9*  --   CREATININE 1.38*   < > 1.10*  --   --   --  1.36*  --   --  1.32*  --   TROPONINIHS 34*  --   --   --  125*  --   --   --   --   --   --    < > = values in this interval not displayed.    Estimated Creatinine Clearance: 53.2 mL/min (A) (by C-G formula based on SCr of 1.32 mg/dL (H)).   Medical History: Past Medical History:  Diagnosis Date  . Acute myopericarditis 02/24/2013  . Atrial fibrillation ([place])   . Atrial fibrillation with RVR ([place]) 02/22/2013  . Chronic anticoagulation, Xarelto 04/02/2013  . Chronic diastolic CHF (congestive heart failure) ([place]) 06/26/2019  . Diverticulitis   . Elevated troponin- secondary to Myopericarditis 02/25/2013  . Emphysema  07/07/2019  . Hypertension   . Lupus ([place]) 09/14/2011  . Multifocal atrial tachycardia ([place]) 02/22/2013  . PAF (paroxysmal atrial fibrillation) ([place]) 04/02/2013  . Pleuritic chest pain 02/22/2013  . 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 Rheumatoid arthritis ([place]) 07/07/2019  . 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 Right ventricular dysfunction 08/05/2019  . Thrombocytosis ([place]) 09/14/2011    Medications:  Infusions:  . amiodarone     Followed by  . amiodarone    . fentaNYL infusion INTRAVENOUS 100 mcg/hr (06/22/20 1206)  . methylPREDNISolone (SOLU-MEDROL) injection Stopped (06/22/20 2331)  . milrinone 0.125 mcg/kg/min (06/23/20 0900)  . piperacillin-tazobactam (ZOSYN)  IV 3.375 g (06/23/20 1014)  . sodium bicarbonate (isotonic) 150 mEq in D5W 1000 mL infusion 50 mL/hr at 06/23/20 0953  . vancomycin      Assessment: The patient was on Apixaban PTA for recent hx DVT/PE in June 2021, also with hx Afib with the last dose 9/14 PM. Now on hold with plans to start Heparin for bridging.  The patient's baseline aPTT is 35 and heparin level is 1.86 and falsely elevated in the setting of recent Apixaban use. INR also noted to be falsely elevated at 2.9. Will monitor heparin with aPTT initially until HL/aPTT are correlated.   Goal of Therapy:  Heparin level 0.3-0.7 units/ml aPTT 66-102 seconds Monitor platelets by anticoagulation protocol: Yes   Plan:  - Start Heparin at 1200 units/hr (12 ml/hr) - Daily aPTT/HL until correlated - Will continue to monitor for any signs/symptoms of bleeding and will follow up with aPTT l in 6 hours   Thank you for allowing pharmacy to be a part of this patient's care.  Alycia Rossetti, PharmD, BCPS Clinical Pharmacist Clinical phone for 06/23/2020: [phone] 06/23/2020 10:55 AM   **Pharmacist phone directory can now be found on [email or web address] ([email or web address]).  Listed under [place].

## 2020-06-23 NOTE — Progress Notes (Signed)
[facility] Progress Note Patient Name: Carolyn Proctor DOB: 08/01/1961 MRN: [id number]   Date of Service  06/23/2020  HPI/Events of Note  Patient needs restraints to prevent self-extubation.  eICU Interventions  Bilateral soft wrist restraints ordered.        Frederik Pear 06/23/2020, 9:49 PM

## 2020-06-23 NOTE — Progress Notes (Signed)
[facility] Progress Note Patient Name: Carolyn Proctor DOB: 05/23/1961 MRN: [id number]   Date of Service  06/23/2020  HPI/Events of Note  Notified of tachycardia 190s BP 140/105 K 3.2, full panel pending  eICU Interventions  Ordered metoprolol 5 mg IV x 3 doses for HR > 150 Ordered K 40 meqs per feeding tube and add on Magnesium level if not included in pending labs  HR down to 90s after one dose of metoprolol, SBP 110s  Follow up remaining lab results     Intervention Category Major Interventions: Arrhythmia - evaluation and management;Electrolyte abnormality - evaluation and management  Judd Lien 06/23/2020, 6:48 AM

## 2020-06-24 DIAGNOSIS — R569 Unspecified convulsions: Secondary | ICD-10-CM

## 2020-06-24 LAB — MPO/PR-3 (ANCA) ANTIBODIES
ANCA Proteinase 3: 12.2 U/mL — ABNORMAL HIGH (ref 0.0–3.5)
Myeloperoxidase Abs: <9 U/mL (ref 0.0–9.0)

## 2020-06-24 LAB — GLUCOSE, CAPILLARY
Glucose-Capillary: 150 mg/dL — ABNORMAL HIGH (ref 70–99)
Glucose-Capillary: 152 mg/dL — ABNORMAL HIGH (ref 70–99)
Glucose-Capillary: 174 mg/dL — ABNORMAL HIGH (ref 70–99)
Glucose-Capillary: 204 mg/dL — ABNORMAL HIGH (ref 70–99)
Glucose-Capillary: 21 mg/dL — CL (ref 70–99)
Glucose-Capillary: 238 mg/dL — ABNORMAL HIGH (ref 70–99)
Glucose-Capillary: 79 mg/dL (ref 70–99)

## 2020-06-24 LAB — ENA+DNA/DS+ANTICH+CENTRO+JO...
Anti JO-1: <0.2 AI (ref 0.0–0.9)
Centromere Ab Screen: <0.2 AI (ref 0.0–0.9)
Chromatin Ab SerPl-aCnc: 4.2 AI — ABNORMAL HIGH (ref 0.0–0.9)
ENA SM Ab Ser-aCnc: 0.4 AI (ref 0.0–0.9)
Ribonucleic Protein: >8 AI — ABNORMAL HIGH (ref 0.0–0.9)
SSA (Ro) (ENA) Antibody, IgG: 0.8 AI (ref 0.0–0.9)
SSB (La) (ENA) Antibody, IgG: <0.2 AI (ref 0.0–0.9)
Scleroderma (Scl-70) (ENA) Antibody, IgG: <0.2 AI (ref 0.0–0.9)
ds DNA Ab: 2 IU/mL (ref 0–9)

## 2020-06-24 LAB — BASIC METABOLIC PANEL
Anion gap: 15 (ref 5–15)
Anion gap: 14 (ref 5–15)
BUN: 23 mg/dL — ABNORMAL HIGH (ref 6–20)
BUN: 25 mg/dL — ABNORMAL HIGH (ref 6–20)
CO2: 23 mmol/L (ref 22–32)
CO2: 23 mmol/L (ref 22–32)
Calcium: 7.4 mg/dL — ABNORMAL LOW (ref 8.9–10.3)
Calcium: 7.6 mg/dL — ABNORMAL LOW (ref 8.9–10.3)
Chloride: 98 mmol/L (ref 98–111)
Chloride: 100 mmol/L (ref 98–111)
Creatinine, Ser: 1.54 mg/dL — ABNORMAL HIGH (ref 0.44–1.00)
Creatinine, Ser: 1.57 mg/dL — ABNORMAL HIGH (ref 0.44–1.00)
GFR calc Af Amer: 43 mL/min — ABNORMAL LOW (ref >60)
GFR calc Af Amer: 42 mL/min — ABNORMAL LOW (ref >60)
GFR calc non Af Amer: 37 mL/min — ABNORMAL LOW (ref >60)
GFR calc non Af Amer: 36 mL/min — ABNORMAL LOW (ref >60)
Glucose, Bld: 226 mg/dL — ABNORMAL HIGH (ref 70–99)
Glucose, Bld: 167 mg/dL — ABNORMAL HIGH (ref 70–99)
Potassium: 2.9 mmol/L — ABNORMAL LOW (ref 3.5–5.1)
Potassium: 3.8 mmol/L (ref 3.5–5.1)
Sodium: 136 mmol/L (ref 135–145)
Sodium: 137 mmol/L (ref 135–145)

## 2020-06-24 LAB — ANTI-DNA ANTIBODY, DOUBLE-STRANDED: ds DNA Ab: 2 IU/mL (ref 0–9)

## 2020-06-24 LAB — PHOSPHORUS
Phosphorus: 3.7 mg/dL (ref 2.5–4.6)
Phosphorus: 3.1 mg/dL (ref 2.5–4.6)

## 2020-06-24 LAB — MAGNESIUM
Magnesium: 2.1 mg/dL (ref 1.7–2.4)
Magnesium: 2.7 mg/dL — ABNORMAL HIGH (ref 1.7–2.4)

## 2020-06-24 LAB — APTT
aPTT: 138 seconds — ABNORMAL HIGH (ref 24–36)
aPTT: 89 seconds — ABNORMAL HIGH (ref 24–36)
aPTT: 58 seconds — ABNORMAL HIGH (ref 24–36)

## 2020-06-24 LAB — CBC
HCT: 37.2 % (ref 36.0–46.0)
Hemoglobin: 12.2 g/dL (ref 12.0–15.0)
MCH: 31.9 pg (ref 26.0–34.0)
MCHC: 32.8 g/dL (ref 30.0–36.0)
MCV: 97.1 fL (ref 80.0–100.0)
Platelets: 294 10*3/uL (ref 150–400)
RBC: 3.83 MIL/uL — ABNORMAL LOW (ref 3.87–5.11)
RDW: 18.6 % — ABNORMAL HIGH (ref 11.5–15.5)
WBC: 17.5 10*3/uL — ABNORMAL HIGH (ref 4.0–10.5)
nRBC: 0.2 % (ref 0.0–0.2)

## 2020-06-24 LAB — CULTURE, BLOOD (ROUTINE X 2)

## 2020-06-24 LAB — ANA W/REFLEX IF POSITIVE: Anti Nuclear Antibody (ANA): POSITIVE — AB

## 2020-06-24 LAB — COOXEMETRY PANEL
Carboxyhemoglobin: 1.7 % — ABNORMAL HIGH (ref 0.5–1.5)
Methemoglobin: 0.8 % (ref 0.0–1.5)
O2 Saturation: 79.4 %
Total hemoglobin: 12.8 g/dL (ref 12.0–16.0)

## 2020-06-24 LAB — LACTIC ACID, PLASMA: Lactic Acid, Venous: 3.1 mmol/L (ref 0.5–1.9)

## 2020-06-24 LAB — HEPARIN LEVEL (UNFRACTIONATED): Heparin Unfractionated: 2.08 IU/mL — ABNORMAL HIGH (ref 0.30–0.70)

## 2020-06-24 LAB — ANTI-SCLERODERMA ANTIBODY: Scleroderma (Scl-70) (ENA) Antibody, IgG: <0.2 AI (ref 0.0–0.9)

## 2020-06-24 LAB — C4 COMPLEMENT: Complement C4, Body Fluid: 12 mg/dL (ref 12–38)

## 2020-06-24 LAB — RHEUMATOID FACTOR: Rheumatoid fact SerPl-aCnc: 282.2 IU/mL — ABNORMAL HIGH (ref 0.0–13.9)

## 2020-06-24 MED ORDER — AMIODARONE HCL IN DEXTROSE 360-4.14 MG/200ML-% IV SOLN
60.0000 mg/h | INTRAVENOUS | Status: DC
  Administered 2020-06-24 (×3): 60 mg/h via INTRAVENOUS
  Filled 2020-06-24: qty 200

## 2020-06-24 MED ORDER — AMIODARONE LOAD VIA INFUSION
150.0000 mg | Freq: Once | INTRAVENOUS | Status: AC
  Administered 2020-06-24: 150 mg via INTRAVENOUS
  Filled 2020-06-24: qty 83.34

## 2020-06-24 MED ORDER — LEVETIRACETAM IN NACL 500 MG/100ML IV SOLN
500.0000 mg | Freq: Two times a day (BID) | INTRAVENOUS | Status: DC
  Administered 2020-06-24 – 2020-06-27 (×6): 500 mg via INTRAVENOUS
  Filled 2020-06-24 (×6): qty 100

## 2020-06-24 MED ORDER — POTASSIUM CHLORIDE 10 MEQ/50ML IV SOLN
10.0000 meq | INTRAVENOUS | Status: AC
  Administered 2020-06-24 (×3): 10 meq via INTRAVENOUS
  Filled 2020-06-24 (×4): qty 50

## 2020-06-24 MED ORDER — POTASSIUM CHLORIDE 20 MEQ PO PACK
40.0000 meq | PACK | Freq: Three times a day (TID) | ORAL | Status: AC
  Administered 2020-06-24 – 2020-06-25 (×6): 40 meq
  Filled 2020-06-24 (×6): qty 2

## 2020-06-24 MED ORDER — MAGNESIUM SULFATE 2 GM/50ML IV SOLN
2.0000 g | Freq: Once | INTRAVENOUS | Status: AC
  Administered 2020-06-24: 2 g via INTRAVENOUS
  Filled 2020-06-24: qty 50

## 2020-06-24 MED ORDER — CHLORHEXIDINE GLUCONATE CLOTH 2 % EX PADS
6.0000 | MEDICATED_PAD | Freq: Every day | CUTANEOUS | Status: DC
  Administered 2020-06-25 – 2020-07-26 (×27): 6 via TOPICAL

## 2020-06-24 MED ORDER — FUROSEMIDE 10 MG/ML IJ SOLN
10.0000 mg/h | INTRAVENOUS | Status: DC
  Administered 2020-06-24: 10 mg/h via INTRAVENOUS
  Administered 2020-06-25 – 2020-06-27 (×4): 20 mg/h via INTRAVENOUS
  Administered 2020-06-28: 10 mg/h via INTRAVENOUS
  Filled 2020-06-24: qty 20
  Filled 2020-06-24 (×5): qty 25

## 2020-06-24 MED ORDER — AMIODARONE HCL IN DEXTROSE 360-4.14 MG/200ML-% IV SOLN
30.0000 mg/h | INTRAVENOUS | Status: DC
  Administered 2020-06-24 (×2): 30 mg/h via INTRAVENOUS
  Filled 2020-06-24 (×2): qty 200

## 2020-06-24 MED ORDER — FUROSEMIDE 10 MG/ML IJ SOLN: 80.0000 mg | Freq: Three times a day (TID) | INTRAMUSCULAR | Status: DC

## 2020-06-24 NOTE — Progress Notes (Signed)
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), Afib, protein C deficiency  No Known Allergies  Patient Measurements: Height: 5\' 11"  (180.3 cm) Weight: 77.9 kg (171 lb 11.8 oz) IBW/kg (Calculated) : 70.8 Heparin Dosing Weight: 74.2 kg  Vital Signs: Temp: 97.2 F (36.2 C) (09/17 0715) BP: 95/65 (09/17 0700) Pulse Rate: 96 (09/17 0715)  Labs: Recent Labs    06/22/20 1139 06/22/20 1140 06/22/20 1358 06/22/20 1508 06/22/20 2110 06/22/20 2215 06/23/20 0534 06/23/20 0534 06/23/20 0541 06/23/20 0541 06/23/20 1011 06/23/20 1316 06/23/20 1716 06/24/20 0119 06/24/20 0415  HGB 13.2   < >  --    < >  --    < > 12.5   < > 14.3   < >  --  15.6*  --   --  12.2  HCT 46.3*   < >  --    < >  --    < > 38.3   < > 42.0  --   --  46.0  --   --  37.2  PLT 248  --   --   --   --   --  254  --   --   --   --   --   --   --  294  APTT  --   --   --   --   --   --   --   --   --   --  35  --  105* 138*  --   LABPROT  --   --  42.6*  --   --   --  29.6*  --   --   --   --   --   --   --   --   INR  --   --  4.7*  --   --   --  2.9*  --   --   --   --   --   --   --   --   HEPARINUNFRC  --   --   --   --   --   --   --   --   --   --  1.86*  --   --  2.08*  --   CREATININE 1.38*   < >  --    < > 1.36*  --  1.32*  --   --   --  1.45*  --   --   --   --   TROPONINIHS 34*  --  125*  --   --   --   --   --   --   --   --   --   --   --   --    < > = values in this interval not displayed.    Estimated Creatinine Clearance: 48.4 mL/min (A) (by C-G formula based on SCr of 1.45 mg/dL (H)).  Medications:  Infusions:  . sodium chloride    . amiodarone    . epinephrine    . feeding supplement (VITAL 1.5 CAL) 1,000 mL (06/23/20 2344)  . fentaNYL infusion INTRAVENOUS 100 mcg/hr (06/24/20 0700)  . heparin 900 Units/hr (06/24/20 0700)  . methylPREDNISolone (SOLU-MEDROL) injection Stopped (06/23/20 2224)  . milrinone 0.125  mcg/kg/min (06/24/20 0719)  . norepinephrine (LEVOPHED) Adult infusion 6 mcg/min (06/24/20 0700)  . piperacillin-tazobactam (ZOSYN)  IV Stopped (06/24/20 4403)  . sodium bicarbonate (isotonic) 150  mEq in D5W 1000 mL infusion 50 mL/hr at 06/24/20 0700  . vancomycin Stopped (06/24/20 0349)    Assessment: The patient was on Apixaban PTA for recent hx DVT/PE in June 2021, also with hx Afib with the last dose 9/14 PM. Now on hold and on Heparin for bridging.  The patient's aPTT this morning is therapeutic after a rate decrease earlier today (aPTT 89 << 138, goal of 66-102). CBC stable - no bleeding noted at this time.   Goal of Therapy:  Heparin level 0.3-0.7 units/ml aPTT 66-102 seconds Monitor platelets by anticoagulation protocol: Yes   Plan:  - Continue Heparin drip at 900 units/hr - Daily HL/aPTT until correlated - Will continue to monitor for any signs/symptoms of bleeding and will follow up with heparin level in 6 hours to confirm therapeutic  Thank you for allowing pharmacy to be a part of this patient's care.  Alycia Rossetti, PharmD, BCPS Clinical Pharmacist Clinical phone for 06/24/2020: [phone] 06/24/2020 7:47 AM   **Pharmacist phone directory can now be found on [email or web address] ([email or web address]).  Listed under [facility].

## 2020-06-24 NOTE — Progress Notes (Signed)
NAME:  Carolyn Proctor, MRN:  [id number], DOB:  December 12, 1960, LOS: 2 ADMISSION DATE:  06/22/2020, CONSULTATION DATE:  06/22/2020 REFERRING MD:  Dr. Tyrone Nine, CHIEF COMPLAINT:  AMS/ refractory hypoglycemia  Brief History   6 yoF at home who complained to family she felt like she was having a stroke, then developed left facial droop and went unresponsive.  Required BVM by EMS and noted to be hypoglycemic and hypotensive.  Intubated in ER for airway protection.  CTH negative.  Refractory hypoglycemia despite multiple D50 amps now on drip.  PCCM called to admit.   History of present illness   HPI obtained from medical chart review and daughter, [name] at beside, as patient is currently intubated and sedated on mechanical ventilation.  59 year old female with prior history of PAF, DVT, and PE on Eliquis, RA,  hypercoagulable state (increased protein S antigen, decreased protein C activity, and decreased Antithrombin III), HFpEF, CKD stage III, centrilobular emphysema and pulmonary fibrosis on daily prednisone- never smoker, chronic venous insufficiency w/ chronic foot ulcers, and diverticulitis.   Daughter reports patient has not felt well since her hospitalization in June at [facility] where she was diagnosed with PE/ DVT and placed on Eliquis in which daughter states she is compliant with.  Since, patient also has been struggling with weight loss, early satiety, muscle weakness, and ongoing lower leg edema/ wounds.  Seen by GI and put on miralax per daughter for constipation.  Daughter also reports her prednisone has been recently tapered.    Patient was at home this morning around 1020, when she reported to her family that she felt like she was having a stroke. Family noticed a left facial droop and then patient proceeded to become unresponsive.  On EMS arrival, patient required continuous BVM. CBG noted 49, afebrile and was hypotensive requiring fluid bolus.  On arrival to ER, she remained altered  and required intubation for airway protection. 2L NS bolus given for hypotension.  She remained hypoglycemic despite multiple D50 requiring D10 gtt.  A right IJ central line was placed in ER. Labs noted for normal WBC/ Hgb, Na 132, CO2 10, sCr 1.38, AG 19, albumin 2.2, normal AST/ ALT, t. Bili 2.9, trop hs 34, LA 10.8, ETOH neg, ammonia 157. CT head without acute process.  CXR showing satisfactory position of ETT, layering right pleural effusion, diffuse interstitial thickening c/w hx of fibrosis.  ABG post intubation 6.954/ 43.4/ 31.3/ 9.6.  Patient remained normotensive but temp 95 requiring bairhugger.  PCCM called for admit.   Past Medical History  PAF, DVT, PE on Eliquis HFpEF CKD stage III RA Hypercoagulable state (increased protein S antigen, decreased protein C activity, and decreased Antithrombin III) Centrilobular emphysema and pulmonary fibrosis- non smoker Chronic venous insufficiency w/ chronic foot ulcers  Diverticulitis   Significant Hospital Events   9/15 admitted/ intubated  Consults:   Procedures:  9/15 ETT >> 9/15 R IJ TL CVC >> 9/15 OGT/ foley  Significant Diagnostic Tests:  9/15 CTH >> negative  9/15 CXR >> ETT 5.3 above carina, layering right pleural effusion, diffuse interstitial thickening c/w hx of fibrosis  9/15 CTA chest/ abd/ pelvis >> significant dilatation of RA and RV consistent with R heart failure; moderate R and small L pleural effusions with associated atelectasis of both lower lobes; underlying chronic cystic and fibrotic lung disease; severe anasarca; nodular contour of liver suggestive of possible cirrhosis 9/15 CTA head and neck >> no significant carotid or vertebral artery stenosis in neck,  no LVO, right jugular central venous catheter with probable surrounding hematoma  9/16 CXR >> stable mild b/l pulmonary edema with small pleural effusions. No PTX noted  Micro Data:  9/15 BCx2 >> 9/15 SARS2 >> neg 9/15 UC >> E coli  Antimicrobials:    9/15 vanc >> 9/17 9/15 zosyn >>  Interim history/subjective:  Patient unresponsive to voice, but does open eyes and withdraws upper extremities to pain.   Patient's BP appearing stable at this time. Still unsure as to what may have caused episode of desaturation yesterday. EEG not showing seizure activity.  Objective   Blood pressure 92/70, pulse 99, temperature (!) 97.5 F (36.4 C), resp. rate (!) 28, height _0  (1.803 m), weight 77.9 kg, last menstrual period 02/19/2013, SpO2 100 %.    Vent Mode: PRVC FiO2 (%):  [40 %-60 %] 40 % Set Rate:  [28 bmp] 28 bmp Vt Set:  [500 mL] 500 mL PEEP:  [5 cmH20] 5 cmH20 Plateau Pressure:  [23 cmH20-25 cmH20] 25 cmH20   Intake/Output Summary (Last 24 hours) at 06/24/2020 1317 Last data filed at 06/24/2020 1100 Gross per 24 hour  Intake 3782.55 ml  Output 1260 ml  Net 2522.55 ml   Filed Weights   06/22/20 1900 06/23/20 0500 06/24/20 0408  Weight: 65.3 kg 74.2 kg 77.9 kg    Examination: General: Ill-appearing adult female, intubated and mechanically ventilated, in NAD. HENT: MM pink but slightly dry, ETT and OGT in place. Pupils minimally reactive to light. CV: slightly tachycardic and irregular rhythm. 2/6 systolic murmur noted. Pulm: Mechanically ventilated breath sounds, bibasilar rales heard. GI: slight firm and slightly distended abdomen. Hypoactive bowel sounds noted. Neuro: Arousable to noxious stimuli, withdraws arms bilaterally and opens eyes.  Extremities: cool and dry, +3 edema in bilateral LE (R>L). Skin: area of redness on right shin.  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 CXR showing persistent bilateral pulmonary edema. - full MV support, PRVC 6-8 cc/kg, FiO2 40% - wean FiO2 for sat goal >  90 - VAP bundle/PPI   - continue milrinone drip - patient is +6L since admission - will transition to lasix 81m TID to offload volume - on day 3 of pulse dose steroids (solumedrol 1g burst x 3 days) for possible acute CTD flare, last dose today, will taper steroids from tomorrow. - preliminary sputum cultures showing rare GPC in pairs - on zosyn empirically - stopped vancomycin - f/u blood cx  Cardiogenic shock 2/2 severe RV failure/cor pulmonale Hx of HFpEF, HTN - cardiology following - ECHO 9/15 showing LVEF 45-50% with compression of LV from RV; RV massively dilated and severely hypokinetic; severe tricuspid regurg with RVSP 76; moderate pericardial effusion - goal map > 65 - f/u blood cx - RV support with milrinone and diuresis (lasix 814mTID) - levophed for BP support - wean pressors as tolerated  Encephalopathy, w/ initial stroke symptoms most likely attributed to hypoglycemia but given hypercoagulable hx needs further workup.  CTH neg for acute changes or bleed.  - CTA head/neck not showing any acute findings, consider MRI when stable - EEG showing potential epileptogenicity from left anterior temporal region and severe diffuse encephalopathy (nonspecific etiology), but no signs of seizure activity. - UDS positive for benzodiazepines - TSH 10.5 - ammonia 157 - serial neuro checks  Hypokalemia -  K 2.9 today, repletion with 66mq KCl TID x2 days - Mg stable at 2.1 - receiving lasix so will need to continue monitoring BMP and replacing electrolytes as needed  UTI - Urine culture showing >100,000 colonies of E. Coli - continue zosyn empirically until sensitivities come back - D/C'd vancomycin as no need for it at this time  Severe AGMA/ lactic acidosis - Resolved  CKD stage III Baseline sCr ~1.15 - 1.39. - Cr 1.45 --> 1.54 - foley in place, 1.2L UOP - stopped bicarb drip - trend BMP - strict I/Os, trend renal indices   Hyperammoniemia, unclear etiology given  normal LFTs except t.bili 2.9,  - CT abd/ pelvis showing somewhat nodular contour of liver, suggestive of possible cirrhosis - continue lactulose - trend LFTs  Hx DVT, PE, and +hypercoagulable workup - checking coags - continue heparin ppx as per pharmacy  VT vs Afib w/Aberrancy - ventricular rates 170s - 190s - on milrinone and levophed - tele monitoring  - start IV amiodarone with 1531mbolus followed by 6061mr drip - continue heparin ppx as per pharmacy  Prolonged QT - avoid QTc prolonging agents - tele monitoring  Best practice:  Diet: NPO, TF Pain/Anxiety/Delirium protocol (if indicated): fentanyl/versed VAP protocol (if indicated): yes DVT prophylaxis: Heparin per pharmacy GI prophylaxis: PPI Glucose control: hourly and prn still stable Mobility: bedrest Code Status: full  Family Communication: Daughter, MakAlwyn [phone] at bedside Disposition: ICU  Labs   CBC: Recent Labs  Lab 06/22/20 1139 06/22/20 1140 06/22/20 2215 06/23/20 0534 06/23/20 0541 06/23/20 1316 06/24/20 0415  WBC 9.3  --   --  9.4  --   --  17.5*  NEUTROABS 7.4  --   --   --   --   --   --   HGB 13.2   < > 16.0* 12.5 14.3 15.6* 12.2  HCT 46.3*   < > 47.0* 38.3 42.0 46.0 37.2  MCV 111.0*  --   --  96.7  --   --  97.1  PLT 248  --   --  254  --   --  294   < > = values in this interval not displayed.    Basic Metabolic Panel: Recent Labs  Lab 06/22/20 1139 06/22/20 1139 06/22/20 1140 06/22/20 1235 06/22/20 1358 06/22/20 1508 06/22/20 2110 06/22/20 2215 06/23/20 0534 06/23/20 0541 06/23/20 1011 06/23/20 1316 06/23/20 1716 06/24/20 0415 06/24/20 0743  NA 132*   < > 133*   < >  --    < > 131*   < > 133* 136 134* 133*  --   --  136  K 4.7   < > 4.7   < >  --    < > 3.5   < > 3.1* 3.2* 3.7 4.0  --   --  2.9*  CL 103   < > 105  --   --   --  102  --  99  --  99  --   --   --  98  CO2 10*  --   --   --   --   --  17*  --  23  --  23  --   --   --  23  GLUCOSE 146*   <  > 134*  --   --   --  337*  --  220*  --  232*  --   --   --  226*  BUN 16   < >  19  --   --   --  15  --  16  --  15  --   --   --  23*  CREATININE 1.38*   < > 1.10*  --   --   --  1.36*  --  1.32*  --  1.45*  --   --   --  1.54*  CALCIUM 7.9*  --   --   --   --   --  7.1*  --  7.2*  --  7.4*  --   --   --  7.4*  MG  --   --   --   --  1.5*  --   --   --   --   --  1.4*  --  1.5* 2.1  --   PHOS  --   --   --   --  5.7*  --   --   --   --   --  3.4  --  3.2 3.7  --    < > = values in this interval not displayed.   GFR: Estimated Creatinine Clearance: 45.6 mL/min (A) (by C-G formula based on SCr of 1.54 mg/dL (H)). Recent Labs  Lab 06/22/20 1139 06/22/20 1147 06/22/20 1429 06/23/20 0534 06/23/20 1352 06/23/20 2214 06/24/20 0415  PROCALCITON  --   --   --  11.22  --   --   --   WBC 9.3  --   --  9.4  --   --  17.5*  LATICACIDVEN  --  10.8* 5.8*  --  7.9* 4.9*  --     Liver Function Tests: Recent Labs  Lab 06/22/20 1139 06/23/20 0534  AST 37 43*  ALT 15 20  ALKPHOS 112 91  BILITOT 2.9* 2.1*  PROT 6.9 5.2*  ALBUMIN 2.2* 1.7*   No results for input(s): LIPASE, AMYLASE in the last 168 hours. Recent Labs  Lab 06/22/20 1147  AMMONIA 157*    ABG    Component Value Date/Time   PHART 7.271 (L) 06/23/2020 1316   PCO2ART 33.0 06/23/2020 1316   PO2ART 357 (H) 06/23/2020 1316   HCO3 15.2 (L) 06/23/2020 1316   TCO2 16 (L) 06/23/2020 1316   ACIDBASEDEF 11.0 (H) 06/23/2020 1316   O2SAT 79.4 06/24/2020 0415     Coagulation Profile: Recent Labs  Lab 06/22/20 1358 06/23/20 0534  INR 4.7* 2.9*    Cardiac Enzymes: No results for input(s): CKTOTAL, CKMB, CKMBINDEX, TROPONINI in the last 168 hours.  HbA1C: Hgb A1c MFr Bld  Date/Time Value Ref Range Status  06/23/2020 05:34 AM 5.5 4.8 - 5.6 % Final    Comment:    (NOTE) Pre diabetes:          5.7%-6.4%  Diabetes:              >6.4%  Glycemic control for   <7.0% adults with diabetes   07/17/2007 02:46 PM   Final    5.3 (NOTE)   The ADA recommends the following therapeutic goals for glycemic   control related to Hgb A1C measurement:   Goal of Therapy:   < 7.0% Hgb A1C   Action Suggested:  > 8.0% Hgb A1C   Ref:  Diabetes Care, 22, Suppl. 1, 1999    CBG: Recent Labs  Lab 06/23/20 1948 06/23/20 2332 06/24/20 0348 06/24/20 0812 06/24/20 1115  GLUCAP 173* 166* 174* 204* 238*    Review of Systems:   Unable  Past Medical History  She,  has a past medical history of Acute myopericarditis (02/24/2013), Atrial fibrillation ([place]), Atrial fibrillation with RVR ([place]) (02/22/2013), Chronic anticoagulation, Xarelto (04/02/2013), Chronic diastolic CHF (congestive heart failure) ([place]) (06/26/2019), Diverticulitis, Elevated troponin- secondary to Myopericarditis (02/25/2013), Emphysema  (07/07/2019), Hypertension, Lupus ([place]) (09/14/2011), Multifocal atrial tachycardia ([place]) (02/22/2013), PAF (paroxysmal atrial fibrillation) ([place]) (04/02/2013), Pleuritic chest pain (02/22/2013), 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   Surgical History    Past Surgical History:  Procedure Laterality Date  . BONE MARROW BIOPSY  2010  . CARDIAC CATHETERIZATION    . LEFT HEART CATHETERIZATION WITH CORONARY ANGIOGRAM N/A 02/24/2013   Procedure: LEFT HEART CATHETERIZATION WITH CORONARY ANGIOGRAM;  Surgeon: Leonie Man, MD;  Location[facility] CATH LAB;  Service: Cardiovascular;  Laterality: N/A;  . RIGHT/LEFT HEART CATH AND CORONARY ANGIOGRAPHY N/A 09/09/2019   Procedure: RIGHT/LEFT HEART CATH AND CORONARY ANGIOGRAPHY;  Surgeon: Jettie Booze, MD;  Location: [facility] CV LAB;  Service: Cardiovascular;  Laterality: N/A;     Social History   reports that she has never smoked. She has  never used smokeless tobacco. She reports that she does not drink alcohol and does not use drugs.   Family History   Her family history includes Hypertension in her maternal grandmother; Liver disease in her mother; Lupus in her brother and mother.   Allergies No Known Allergies   Home Medications  Prior to Admission medications   Medication Sig Start Date End Date Taking? Authorizing Provider  albuterol (VENTOLIN HFA) 108 (90 Base) MCG/ACT inhaler Inhale 2 puffs into the lungs every 6 (six) hours as needed for wheezing or shortness of breath. 06/26/19   [place] Donnamarie Poag, DO  Calcium Carbonate-Vit D-Min (CALCIUM 1200 PO) Take 1 capsule by mouth daily.    [provider]  Cholecalciferol (VITAMIN D) 50 MCG (2000 UT) tablet Take 2,000 Units by mouth daily.     [provider]  Cyanocobalamin (VITAMIN B-12) 1000 MCG SUBL Place 1,000 mcg under the tongue.    [provider]  furosemide (LASIX) 20 MG tablet Take 1 tablet (20 mg total) by mouth 3 (three) times a week. 09/23/19   Jettie Booze, MD  pantoprazole (PROTONIX) 40 MG tablet Take 1 tablet (40 mg total) by mouth daily. 09/28/19 12/27/19  Margaretha Seeds, MD  predniSONE ([place]) 5 MG tablet TAKE 2 TABLETS BY MOUTH EVERY DAY 01/18/20   Margaretha Seeds, MD     Critical care time: 37 mins     Virl Axe, MD 06/24/2020, 1:17 PM Pager: [phone]

## 2020-06-24 NOTE — H&P (Addendum)
Neurology Consultation  Reason for Consult: seizures Referring Physician: Dr. Lynetta Mare  CC: seizure activity  History is obtained from: RN, daughter, provider notes  HPI: Carolyn Proctor is a 59 y.o. female pertinent risk factor in PHx  PAF, DVT, and PE on Eliquis, RA,  hypercoagulable state (increased protein S antigen, decreased protein C activity, and decreased Antithrombin III), HFpEF, CKD  stage III, centrilobular emphysema and pulmonary fibrosis on daily prednisone (recently tapered off), never smoker, chronic venous insufficiency w/ chronic foot ulcers, and diverticulitis initially admitted for acute respiratory failure secondary to ILD w/combined decompensated RHF w/cardiogenic shock and pulmonary edema.   She had witnessed seizure activity and was treated with Ativan.  Due to this, she was started on continuous EEG which demonstrated left frontal sharps.  Hospital course complicated by evidence of potential epileptogenicity from L aterior temporal region with severe diffuse encephalopathy seen on EEG. No signs of seizure activity noted. Patient is currently on day 2 LTM, has been started on keppra.    Past Medical History:  Diagnosis Date  . Acute myopericarditis 02/24/2013  . Atrial fibrillation ([place])   . Atrial fibrillation with RVR ([place]) 02/22/2013  . Chronic anticoagulation, Xarelto 04/02/2013  . Chronic diastolic CHF (congestive heart failure) ([place]) 06/26/2019  . Diverticulitis   . Elevated troponin- secondary to Myopericarditis 02/25/2013  . Emphysema  07/07/2019  . Hypertension   . Lupus ([place]) 09/14/2011  . 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 PAF (paroxysmal atrial fibrillation) ([place]) 04/02/2013  . Pleuritic chest pain 02/22/2013  . 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 Rheumatoid arthritis ([place]) 07/07/2019  . 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 Right ventricular dysfunction 08/05/2019  . Thrombocytosis ([place]) 09/14/2011    Family History  Problem Relation Age of Onset  . Lupus Mother   . Liver disease Mother   . Hypertension Maternal Grandmother   . Lupus Brother    Social History:   reports that she has never smoked. She has never used smokeless tobacco. She reports that she does not drink alcohol and does not use drugs.  Medications  Current Facility-Administered Medications:  .  Place/Maintain arterial line, , , Until Discontinued **AND** 0.9 %  sodium chloride infusion, , Intra-arterial, PRN, Kipp Brood, MD .  Margrett Rud amiodarone (NEXTERONE) 1.8 mg/mL load via infusion 150 mg, 150 mg, Intravenous, Once, 150 mg at 06/24/20 1014 **FOLLOWED BY** amiodarone (NEXTERONE PREMIX) 360-4.14 MG/200ML-% (1.8 mg/mL) IV infusion, 60 mg/hr, Intravenous, Continuous, Last Rate: 33.3 mL/hr at 06/24/20 1400, 60 mg/hr at 06/24/20 1400 **FOLLOWED BY** amiodarone (NEXTERONE PREMIX) 360-4.14 MG/200ML-% (1.8 mg/mL) IV infusion, 30 mg/hr, Intravenous, Continuous, Agarwala, Ravi, MD .  chlorhexidine gluconate (MEDLINE KIT) (PERIDEX) 0.12 % solution 15 mL, 15 mL, Mouth Rinse, BID, Simpson, Paula B, NP, 15 mL at 06/24/20 0722 .  [START ON 06/25/2020] Chlorhexidine Gluconate Cloth 2 % PADS 6 each, 6 each, Topical, Q0600, Agarwala, Ravi, MD .  digoxin (LANOXIN) tablet 0.125 mg, 0.125 mg, Per Tube, Daily, Audria Nine, DO, 0.125 mg at 06/24/20 0910 .  docusate (COLACE) 50 MG/5ML liquid 100 mg, 100 mg, Per Tube, BID PRN, Audria Nine, DO .  feeding supplement (PROSource TF) liquid 90 mL, 90 mL, Per Tube, BID, Agarwala, Ravi, MD, 90 mL at 06/24/20 0909 .  feeding supplement (VITAL 1.5 CAL) liquid 1,000 mL, 1,000 mL, Per  Tube, Continuous, Agarwala, Ravi, MD, Last Rate: 45 mL/hr at 06/23/20 2344, 1,000 mL at 06/23/20 2344 .  fentaNYL (SUBLIMAZE) bolus via infusion 50 mcg, 50 mcg, Intravenous, Q15 min PRN, Deno Etienne,  DO .  fentaNYL 2523mg in NS 2549m(1049mml) infusion-PREMIX, 50-200 mcg/hr, Intravenous, Continuous, FloDeno EtienneO, Last Rate: 10 mL/hr at 06/24/20 1400, 100 mcg/hr at 06/24/20 1400 .  furosemide (LASIX) 250 mg in dextrose 5 % 250 mL (1 mg/mL) infusion, 10 mg/hr, Intravenous, Continuous, Jinwala, Sagar, MD .  heparin ADULT infusion 100 units/mL (25000 units/250m40mdium chloride 0.45%), 900 Units/hr, Intravenous, Continuous, Bryk, Veronda P, RPH, Last Rate: 9 mL/hr at 06/24/20 1400, 900 Units/hr at 06/24/20 1400 .  insulin aspart (novoLOG) injection 0-9 Units, 0-9 Units, Subcutaneous, Q4H, AgarKipp Brood, 3 Units at 06/24/20 1156 .  insulin aspart (novoLOG) injection 3 Units, 3 Units, Subcutaneous, Q4H, AgarKipp Brood, 3 Units at 06/24/20 1156 .  lactulose (CHRONULAC) 10 GM/15ML solution 30 g, 30 g, Per Tube, TID, Bensimhon, DaniShaune Pascal, 30 g at 06/24/20 0909 .  levETIRAcetam (KEPPRA) IVPB 500 mg/100 mL premix, 500 mg, Intravenous, Q12H, JinwVirl Axe, Last Rate: 400 mL/hr at 06/24/20 1535, 500 mg at 06/24/20 1535 .  MEDLINE mouth rinse, 15 mL, Mouth Rinse, 10 times per day, SimpJennelle HumanNP, 15 mL at 06/24/20 1420 .  methylPREDNISolone sodium succinate (SOLU-MEDROL) 1,000 mg in sodium chloride 0.9 % 50 mL IVPB, 1,000 mg, Intravenous, Q24H, Bensimhon, DaniShaune Pascal, Stopped at 06/23/20 2224 .  [START ON 06/25/2020] methylPREDNISolone sodium succinate (SOLU-MEDROL) 125 mg/2 mL injection 80 mg, 80 mg, Intravenous, Daily, Agarwala, Ravi, MD .  midazolam (VERSED) injection 2 mg, 2 mg, Intravenous, Q15 min PRN, FloyDeno Etienne, 2 mg at 06/23/20 1746 .  midazolam (VERSED) injection 2 mg, 2 mg, Intravenous, Q2H PRN, FloyDeno Etienne, 2 mg at 06/24/20 [id number] (PRIMACOR) 20 MG/100 ML (0.2 mg/mL) infusion, 0.125 mcg/kg/min, Intravenous, Continuous, MarsAudria Nine, Last Rate: 2.45 mL/hr at 06/24/20 1400, 0.125 mcg/kg/min at 06/24/20 1400 .  norepinephrine (LEVOPHED) 4mg 20m250mL 71mmix infusion, 0-40 mcg/min, Intravenous, Titrated, Agarwala, Ravi, MD, Last Rate: 33.8 mL/hr at 06/24/20 1400, 9 mcg/min at 06/24/20 1400 .  pantoprazole sodium (PROTONIX) 40 mg/20 mL oral suspension 40 mg, 40 mg, Per Tube, Daily, Agarwala, Ravi, MD, 40 mg at 06/24/20 0909 .  piperacillin-tazobactam (ZOSYN) IVPB 3.375 g, 3.375 g, Intravenous, Q8H, MartinRolla Flatten SLouis[facility] Natchitochesped at 06/24/20 1314 .  polyethylene glycol (MIRALAX / GLYCOLAX) packet 17 g, 17 g, Per Tube, Daily PRN, MarshaAudria Nine  potassium chloride (KLOR-CON) packet 40 mEq, 40 mEq, Per Tube, TID, AgarwaKipp Brood40 mEq at 06/24/20 0947  ROS:   Unable to obtain due to altered mental status.   Exam: Current vital signs: BP 98/74   Pulse (!) 105   Temp 98.6 F (37 C)   Resp 16   Ht 5' 11" (1.803 m)   Wt 77.9 kg   LMP 02/19/2013   SpO2 100%   BMI 23.95 kg/m  Vital signs in last 24 hours: Temp:  [95.9 F (35.5 C)-99.3 F (37.4 C)] 98.6 F (37 C) (09/17 1500) Pulse Rate:  [42-126] 105 (09/17 1500) Resp:  [0-29] 16 (09/17 1500) BP: (78-105)/(59-80) 98/74 (09/17 1400) SpO2:  [81 %-100 %] 100 % (09/17 1500) Arterial Line BP: (85-286)/(57-283) 114/71 (09/17 1500) FiO2 (%):  [40 %-60 %] 40 % (09/17 1152) Weight:  [77.9 kg] 77.9 kg (09/17 0408)  Constitutional: Appears well-developed and well-nourished.  Psych: pt is sedated Eyes: No scleral injection HENT: ET tube in place. Head: Normocephalic.  LTM leads in place Cardiovascular: Normal rate and regular rhythm.  Respiratory: mechanical breath sounds bilat   Neuro: (exam limited d/t being intubated and sedated) Mental Status: Patient is asleep, arouses to loud voice She is able to follow commands.  Cranial Nerves: II: UTA. Blinks to threat III,IV, VI: Her eyes are relatively midline, with a slight exotropia which worsens as she becomes more sleepy V: VII blinks to eyelid stimulation bilaterally Motor: She follows commands in all four  extremities Sensory: Sensation is symmetric to light touch and temperature in the arms and legs. Cerebellar: UTA  Labs I have reviewed labs in epic and the results pertinent to this consultation are:  CBC    Component Value Date/Time   WBC 17.5 (H) 06/24/2020 0415   RBC 3.83 (L) 06/24/2020 0415   HGB 12.2 06/24/2020 0415   HGB 14.0 08/17/2019 1520   HGB 13.8 04/02/2013 0901   HCT 37.2 06/24/2020 0415   HCT 42.4 08/17/2019 1520   HCT 40.9 04/02/2013 0901   PLT 294 06/24/2020 0415   PLT 449 08/17/2019 1520   MCV 97.1 06/24/2020 0415   MCV 90 08/17/2019 1520   MCV 99 04/02/2013 0901   MCH 31.9 06/24/2020 0415   MCHC 32.8 06/24/2020 0415   RDW 18.6 (H) 06/24/2020 0415   RDW 16.3 (H) 08/17/2019 1520   RDW 12.7 04/02/2013 0901   LYMPHSABS 0.7 06/22/2020 1139   LYMPHSABS 2.0 04/02/2013 0901   MONOABS 0.7 06/22/2020 1139   EOSABS 0.4 06/22/2020 1139   EOSABS 0.5 04/02/2013 0901   BASOSABS 0.1 06/22/2020 1139   BASOSABS 0.1 04/02/2013 0901    CMP     Component Value Date/Time   NA 136 06/24/2020 0743   NA 139 08/17/2019 1520   K 2.9 (L) 06/24/2020 0743   CL 98 06/24/2020 0743   CO2 23 06/24/2020 0743   GLUCOSE 226 (H) 06/24/2020 0743   BUN 23 (H) 06/24/2020 0743   BUN 18 08/17/2019 1520   CREATININE 1.54 (H) 06/24/2020 0743   CREATININE 0.75 03/31/2013 1536   CALCIUM 7.4 (L) 06/24/2020 0743   PROT 5.2 (L) 06/23/2020 0534   ALBUMIN 1.7 (L) 06/23/2020 0534   AST 43 (H) 06/23/2020 0534   ALT 20 06/23/2020 0534   ALKPHOS 91 06/23/2020 0534   BILITOT 2.1 (H) 06/23/2020 0534   GFRNONAA 37 (L) 06/24/2020 0743   GFRAA 43 (L) 06/24/2020 0743    Lipid Panel     Component Value Date/Time   CHOL  07/18/2007 0305    137        ATP III CLASSIFICATION:  <200     mg/dL   Desirable  200-239  mg/dL   Borderline High  >=240    mg/dL   High   TRIG 48 07/18/2007 0305   HDL 39 (L) 07/18/2007 0305   CHOLHDL 3.5 07/18/2007 0305   VLDL 10 07/18/2007 0305   LDLCALC   07/18/2007 0305    88        Total Cholesterol/HDL:CHD Risk Coronary Heart Disease Risk Table                     Men   Women  1/2 Average Risk   3.4   3.3    Imaging I have reviewed the images obtained:  CT-scan of the brain 9/15 No acute intracranial abnormality noted.  MRI examination of the brain- pending  Assessment:  59 yo F with PMH of PAF, DVT, and PE on Eliquis, RA,  hypercoagulable state, HFpEF, CKD  stage III, centrilobular emphysema and pulmonary fibrosis, chronic venous insufficiency w/ chronic foot ulcers initially admitted for acute respiratory failure secondary to ILD w/combined decompensated RHF w/cardiogenic shock and pulmonary edema with a witnessed seizure and  Impression:  Seizure.  Recommendations: - continue keppra - recommend MRI brain when able -If no seizures by tomorrow, can discontinue LTM - neurology team will continue to follow  Posey Pronto PA-C [facility] [phone]  Discussed with attending neurologist review of note to follow from [name].   I have seen the patient and reviewed the above note  59 year old female with new onset focal seizure with epileptogenic city seen on EEG.  She will need structural brain imaging with MRI given the focal nature of her epileptogenic city.  In any case, she will need continued antiepileptic therapy.  I suspect that her encephalopathy is multifactorial including her medical state as well as potentially what ever process is causing her seizures.  Work-up as above.  Roland Rack, MD [facility] [phone]  If 7pm- 7am, please page neurology on call as listed in [place].

## 2020-06-24 NOTE — Progress Notes (Addendum)
Advanced Heart Failure Rounding Note   Subjective:    Significant event 9/16>>Became suddenly unresponsive w/ hypotension and hypoxia by saturation requiring placement of arterial line and initiation of NE. Oxygen saturation improved following improvement in blood pressure.  STAT Chest x-ray showed bilateral interstitial lung disease but no evidence of pneumothorax.  Bedside echo showed interval change w/ slight decrease in LV systolic function with little change in RV function.  IVC remains dilated.  Remains tenuous. Still on NE but tolerating wean, down from 15 yesterday to 5 mcg currently. On Milrinone 0.125. MAPs in mid 25s. Co-ox 80%.   During this exam, she went into VT vs afib w/ aberrancy w/ rates into the 190s. K 2.9. Mg 2.1   Getting continuous EEG and awake on vent.   WBC up at 17K but likely due to steroids. AF. On empiric abx w/ zosyn.   1.3L in UOP yesterday on 80 IV lasix bid. PCCM increasing dosage to tid today.     Objective:   Weight Range:  Vital Signs:   Temp:  [95.9 F (35.5 C)-99.3 F (37.4 C)] 96.6 F (35.9 C) (09/17 0800) Pulse Rate:  [31-151] 99 (09/17 0800) Resp:  [0-29] 25 (09/17 0800) BP: (62-132)/(44-103) 101/70 (09/17 0800) SpO2:  [34 %-100 %] 100 % (09/17 0746) Arterial Line BP: (85-286)/(56-283) 122/77 (09/17 0800) FiO2 (%):  [40 %-60 %] 40 % (09/17 0746) Weight:  [77.9 kg] 77.9 kg (09/17 0408) Last BM Date: 06/24/20  Weight change: Filed Weights   06/22/20 1900 06/23/20 0500 06/24/20 0408  Weight: 65.3 kg 74.2 kg 77.9 kg    Intake/Output:   Intake/Output Summary (Last 24 hours) at 06/24/2020 0903 Last data filed at 06/24/2020 0800 Gross per 24 hour  Intake 3426.35 ml  Output 1235 ml  Net 2191.35 ml     Physical Exam: General:  Chronically ill appearing, thin AAF. Awake on vent. Continuous EEG HEENT: normal + ETT Neck: supple. JVP elevated to ear . Carotids 2+ bilat; no bruits. No lymphadenopathy or thryomegaly  appreciated. Cor: PMI nondisplaced. Irregular and tachy 2/6 TR Lungs: intubated, course BS bilaterally, no wheezing  Abdomen: soft, nontender, + distention. No hepatosplenomegaly. No bruits or masses. Good bowel sounds. Extremities: no cyanosis, clubbing, rash, 2+ bilateral LE edema up to thighs w/ bilateral fluid blisters  Neuro: awake on vent and follows commands, moves all 4 extremities, appears slightly agitated   Telemetry: PAF, VT vs Afib w/ aberrancy currently w/ V-rates up into the 190s   Labs: Basic Metabolic Panel: Recent Labs  Lab 06/22/20 1139 06/22/20 1139 06/22/20 1140 06/22/20 1235 06/22/20 1358 06/22/20 1508 06/22/20 2110 06/22/20 2110 06/22/20 2215 06/23/20 0534 06/23/20 0541 06/23/20 1011 06/23/20 1316 06/23/20 1716 06/24/20 0415  NA 132*   < > 133*   < >  --    < > 131*   < > 136 133* 136 134* 133*  --   --   K 4.7   < > 4.7   < >  --    < > 3.5   < > 3.4* 3.1* 3.2* 3.7 4.0  --   --   CL 103  --  105  --   --   --  102  --   --  99  --  99  --   --   --   CO2 10*  --   --   --   --   --  17*  --   --  23  --  23  --   --   --   GLUCOSE 146*  --  134*  --   --   --  337*  --   --  220*  --  232*  --   --   --   BUN 16  --  19  --   --   --  15  --   --  16  --  15  --   --   --   CREATININE 1.38*  --  1.10*  --   --   --  1.36*  --   --  1.32*  --  1.45*  --   --   --   CALCIUM 7.9*  --   --   --   --    < > 7.1*  --   --  7.2*  --  7.4*  --   --   --   MG  --   --   --   --  1.5*  --   --   --   --   --   --  1.4*  --  1.5* 2.1  PHOS  --   --   --   --  5.7*  --   --   --   --   --   --  3.4  --  3.2 3.7   < > = values in this interval not displayed.    Liver Function Tests: Recent Labs  Lab 06/22/20 1139 06/23/20 0534  AST 37 43*  ALT 15 20  ALKPHOS 112 91  BILITOT 2.9* 2.1*  PROT 6.9 5.2*  ALBUMIN 2.2* 1.7*   No results for input(s): LIPASE, AMYLASE in the last 168 hours. Recent Labs  Lab 06/22/20 1147  AMMONIA 157*    CBC: Recent Labs   Lab 06/22/20 1139 06/22/20 1140 06/22/20 2215 06/23/20 0534 06/23/20 0541 06/23/20 1316 06/24/20 0415  WBC 9.3  --   --  9.4  --   --  17.5*  NEUTROABS 7.4  --   --   --   --   --   --   HGB 13.2   < > 16.0* 12.5 14.3 15.6* 12.2  HCT 46.3*   < > 47.0* 38.3 42.0 46.0 37.2  MCV 111.0*  --   --  96.7  --   --  97.1  PLT 248  --   --  254  --   --  294   < > = values in this interval not displayed.    Cardiac Enzymes: No results for input(s): CKTOTAL, CKMB, CKMBINDEX, TROPONINI in the last 168 hours.  BNP: BNP (last 3 results) No results for input(s): BNP in the last 8760 hours.  ProBNP (last 3 results) No results for input(s): PROBNP in the last 8760 hours.    Other results:  Imaging: CT ANGIO HEAD W OR WO CONTRAST  Result Date: 06/22/2020 CLINICAL DATA:  Abdominal pain. Syncope, unresponsive. Hypotension. Hypoglycemia. Rule out dissection EXAM: CT ANGIOGRAPHY HEAD AND NECK TECHNIQUE: Multidetector CT imaging of the head and neck was performed using the standard protocol during bolus administration of intravenous contrast. Multiplanar CT image reconstructions and MIPs were obtained to evaluate the vascular anatomy. Carotid stenosis measurements (when applicable) are obtained utilizing NASCET criteria, using the distal internal carotid diameter as the denominator. CONTRAST:  78mL OMNIPAQUE IOHEXOL 350 MG/ML SOLN COMPARISON:  CT head 06/22/2020 FINDINGS: CTA NECK FINDINGS Aortic  arch: Limited evaluation of the aortic arch. See separate CT angio chest report from today. Proximal great vessels widely patent. Right carotid system: Right carotid widely patent. Negative for stenosis or dissection. Left carotid system: Left carotid widely patent without stenosis or dissection. Vertebral arteries: Both vertebral arteries widely patent to the basilar without stenosis or dissection. Skeleton: No acute skeletal abnormality. Other neck: Right jugular central venous catheter. Soft tissue swelling  right neck due to hematoma. There is gas in the subcutaneous tissues bilaterally which may be venous gas from prior line placement. There is gas in the central veins. Patient is intubated with endotracheal tube above the carina. Upper chest: Chest CT reported separately Review of the MIP images confirms the above findings CTA HEAD FINDINGS Anterior circulation: Cavernous carotid patent bilaterally without atherosclerotic disease or stenosis. Anterior and middle cerebral arteries are small in caliber diffusely likely due to hypotension. There is mild to moderate stenosis of the right middle cerebral artery. Mild to moderate stenosis superior division of the left middle cerebral artery. No large vessel occlusion or aneurysm. Posterior circulation: Small caliber posterior circulation likely due to hypotension. Both vertebral arteries patent to the basilar. Basilar patent. Superior cerebellar and posterior cerebral arteries are patent. Limited intracranial vascular evaluation due to small caliber vessels and decreased contrast enhancement. Venous sinuses: Minimal venous contrast due none to arterial phase scanning Anatomic variants: None Review of the MIP images confirms the above findings IMPRESSION: 1. No significant carotid or vertebral artery stenosis in the neck 2. Small caliber intracranial circulation anteriorly and posteriorly likely due to hypotension. This limits evaluation. There is mild to moderate stenosis in the middle cerebral arteries bilaterally. No large vessel occlusion or aneurysm identified. 3. Right jugular central venous catheter with probable surrounding hematoma. 4. Gas in the soft tissues the neck likely venous gas from IV infusion. 5. Endotracheal tube above the carina. Electronically Signed   By: Franchot Gallo M.D.   On: 06/22/2020 16:11   CT Head Wo Contrast  Result Date: 06/22/2020 CLINICAL DATA:  Unresponsive EXAM: CT HEAD WITHOUT CONTRAST TECHNIQUE: Contiguous axial images were  obtained from the base of the skull through the vertex without intravenous contrast. COMPARISON:  01/16/2013 FINDINGS: Brain: No evidence of acute infarction, hemorrhage, hydrocephalus, extra-axial collection or mass lesion/mass effect. Vascular: No hyperdense vessel or unexpected calcification. Skull: Normal. Negative for fracture or focal lesion. Sinuses/Orbits: No acute finding. Other: None. IMPRESSION: No acute intracranial abnormality noted. Electronically Signed   By: Inez Catalina M.D.   On: 06/22/2020 12:42   DG CHEST PORT 1 VIEW  Result Date: 06/23/2020 CLINICAL DATA:  Respiratory arrest, oxygen desaturation EXAM: PORTABLE CHEST 1 VIEW COMPARISON:  Portable exam 1325 hours compared to 06/22/2020 FINDINGS: Tip of endotracheal tube projects 4.1 cm above carina. Nasogastric tube extends into stomach. External pacing leads project over chest. RIGHT jugular line with tip projecting over SVC. Enlargement of cardiac silhouette with vascular congestion. Diffuse BILATERAL pulmonary infiltrates unchanged favor pulmonary edema. Small probable BILATERAL pleural effusions. No pneumothorax. IMPRESSION: Persistent BILATERAL pulmonary edema and probable small BILATERAL pleural effusions. Electronically Signed   By: Lavonia Dana M.D.   On: 06/23/2020 13:40   DG Chest Port 1 View  Result Date: 06/23/2020 CLINICAL DATA:  Respiratory failure. EXAM: PORTABLE CHEST 1 VIEW COMPARISON:  July 17, 2007. FINDINGS: Stable cardiomegaly. Endotracheal and nasogastric tubes are in good position. Right internal jugular catheter is noted with tip in expected position of the SVC. No pneumothorax is noted. Mild reticular densities  are noted throughout both lungs most consistent with pulmonary edema with small pleural effusions. Bony thorax is unremarkable. IMPRESSION: Stable support apparatus. Stable mild bilateral pulmonary edema with small pleural effusions. No pneumothorax is noted. Electronically Signed   By: Marijo Conception M.D.    On: 06/23/2020 08:18   DG Chest Portable 1 View  Result Date: 06/22/2020 CLINICAL DATA:  Central catheter placement.  Hypoxia. EXAM: PORTABLE CHEST 1 VIEW COMPARISON:  June 22, 2020 study obtained earlier in the day FINDINGS: Central catheter tip is in the superior vena cava. Central catheter tip is 2.7 cm above the carina. Nasogastric tube tip and side port are below the diaphragm. No pneumothorax. There is airspace consolidation in portions of each lower lobe. There is a loculated effusion on the right. There is diffuse interstitial thickening throughout the lungs bilaterally with interstitial and patchy airspace opacity throughout the mid and lower lung regions, slightly increased from earlier in the day. There is cardiomegaly with pulmonary vascularity normal. No adenopathy. No bone lesions. IMPRESSION: Tube and catheter positions as described without pneumothorax. Persistent loculated pleural effusion on the right. Consolidation in each lower lung region, increased. Widespread interstitial and patchy airspace opacity throughout the mid and lower lungs has progressed slightly. Electronically Signed   By: Lowella Grip III M.D.   On: 06/22/2020 13:48   DG Chest Port 1 View  Result Date: 06/22/2020 CLINICAL DATA:  Hypoxia EXAM: PORTABLE CHEST 1 VIEW COMPARISON:  June 23, 2019 FINDINGS: Endotracheal tube tip is 5.3 cm above the carina. Nasogastric tube tip and side port are below the diaphragm. No pneumothorax. There is apparent layering effusion on the right. There is diffuse interstitial thickening with suspected underlying fibrosis. There is no frank edema or airspace opacity. There is cardiomegaly with pulmonary vascularity normal. No adenopathy. No bone lesions. IMPRESSION: Tube positions as described without pneumothorax. Apparent layering effusion on the right. Cardiomegaly. No frank edema or consolidation. Underlying fibrosis present bilaterally. Electronically Signed   By: Lowella Grip III M.D.   On: 06/22/2020 12:16   EEG adult  Result Date: 06/22/2020 [name], MD     06/23/2020  7:34 AM Patient Name: CLIO GERHART MRN: [id number] Epilepsy Attending: [name] Referring Physician/Provider: Dr Audria Nine Date: 06/22/2020 Duration: 25.19 mins Patient history: 30o F with ams. EEG to evaluate for seizure. Level of alertness: comatose AEDs during EEG study: None Technical aspects: This EEG study was done with scalp electrodes positioned according to the 10-20 International system of electrode placement. Electrical activity was acquired at a sampling rate of $Remov'500Hz'AqQnBu$  and reviewed with a high frequency filter of $RemoveB'70Hz'SnWsnprR$  and a low frequency filter of $RemoveB'1Hz'ESjacOVo$ . EEG data were recorded continuously and digitally stored. Description: EEG showed continuous generalized 3 to 6 Hz theta-delta slowing admixed with 13-$RemoveBeforeD'15Hz'nRfDMyzEHeScXq$  beta activity in frontocentral region. Hyperventilation and photic stimulation were not performed.   ABNORMALITY -Continuous slow, generalized IMPRESSION: This study is suggestive of severe diffuse encephalopathy, nonspecific etiology but likely related to sedation. No seizures or epileptiform discharges were seen throughout the recording. [name]   ECHOCARDIOGRAM COMPLETE BUBBLE STUDY  Result Date: 06/22/2020    ECHOCARDIOGRAM REPORT   Patient Name:   JANAE BONSER Date of Exam: 06/22/2020 Medical Rec #:  [id number]       Height:       71.0 in Accession #:    [id number]      Weight:       143.8 lb Date of Birth:  November 10, 1960       BSA:          1.833 m Patient Age:    89 years        BP:           104/85 mmHg Patient Gender: F               HR:           109 bpm. Exam Location:  Inpatient Procedure: 2D Echo and Saline Contrast Bubble Study Indications:    stroke 434.91  History:        Patient has prior history of Echocardiogram examinations, most                 recent 06/23/2020. CHF, lupus; Arrythmias:Atrial Fibrillation.  Sonographer:    Johny Chess  Referring Phys: [id number] [place]  1. Left ventricular ejection fraction, by estimation, is 35 to 40%. The left ventricle has moderately decreased function. The left ventricle demonstrates global hypokinesis. Left ventricular diastolic parameters are consistent with Grade II diastolic dysfunction (pseudonormalization). There is the interventricular septum is flattened in systole and diastole, consistent with right ventricular pressure and volume overload.  2. Right ventricular systolic function is severely reduced. The right ventricular size is severely enlarged. Mildly increased right ventricular wall thickness. There is severely elevated pulmonary artery systolic pressure. The estimated right ventricular systolic pressure is 06.3 mmHg.  3. There is evidence of right to left shunting after 3-4 cardiac cycles suggestive of a PFO. . Evidence of atrial level shunting detected by color flow Doppler.  4. Right atrial size was severely dilated.  5. Moderate pericardial effusion.  6. The mitral valve is normal in structure. Mild mitral valve regurgitation.  7. The tricuspid valve is degenerative. Tricuspid valve regurgitation is severe.  8. The aortic valve is grossly normal. Aortic valve regurgitation is not visualized.  9. The inferior vena cava is dilated in size with <50% respiratory variability, suggesting right atrial pressure of 15 mmHg. FINDINGS  Left Ventricle: Left ventricular ejection fraction, by estimation, is 35 to 40%. The left ventricle has moderately decreased function. The left ventricle demonstrates global hypokinesis. The left ventricular internal cavity size was small. There is no left ventricular hypertrophy. The interventricular septum is flattened in systole and diastole, consistent with right ventricular pressure and volume overload. Left ventricular diastolic parameters are consistent with Grade II diastolic dysfunction (pseudonormalization). Right Ventricle: The right  ventricular size is severely enlarged. Mildly increased right ventricular wall thickness. Right ventricular systolic function is severely reduced. There is severely elevated pulmonary artery systolic pressure. The tricuspid regurgitant velocity is 3.88 m/s, and with an assumed right atrial pressure of 15 mmHg, the estimated right ventricular systolic pressure is 01.6 mmHg. Left Atrium: Left atrial size was normal in size. Right Atrium: Right atrial size was severely dilated. Pericardium: A moderately sized pericardial effusion is present. Mitral Valve: The mitral valve is normal in structure. Mild mitral valve regurgitation. Tricuspid Valve: The tricuspid valve is degenerative in appearance. Tricuspid valve regurgitation is severe. Aortic Valve: The aortic valve is grossly normal. Aortic valve regurgitation is not visualized. Pulmonic Valve: The pulmonic valve was normal in structure. Pulmonic valve regurgitation is trivial. Aorta: The aortic root and ascending aorta are structurally normal, with no evidence of dilitation. Venous: The inferior vena cava is dilated in size with less than 50% respiratory variability, suggesting right atrial pressure of 15 mmHg. IAS/Shunts: Evidence of atrial level shunting detected by color flow Doppler. Agitated  saline contrast was given intravenously to evaluate for intracardiac shunting.  LEFT VENTRICLE PLAX 2D LVIDd:         3.40 cm  Diastology LVIDs:         2.70 cm  LV e' medial: 2.72 cm/s LV PW:         1.20 cm LV IVS:        1.20 cm LVOT diam:     1.60 cm LV SV:         14 LV SV Index:   8 LVOT Area:     2.01 cm  RIGHT VENTRICLE         IVC TAPSE (M-mode): 0.7 cm  IVC diam: 2.50 cm LEFT ATRIUM             Index       RIGHT ATRIUM           Index LA diam:        2.70 cm 1.47 cm/m  RA Area:     26.90 cm LA Vol (A2C):   34.5 ml 18.82 ml/m RA Volume:   93.70 ml  51.13 ml/m LA Vol (A4C):   34.8 ml 18.99 ml/m LA Biplane Vol: 37.4 ml 20.41 ml/m  AORTIC VALVE LVOT Vmax:   49.60  cm/s LVOT Vmean:  37.400 cm/s LVOT VTI:    0.069 m  AORTA Ao Root diam: 3.30 cm Ao Asc diam:  2.90 cm TRICUSPID VALVE TR Peak grad:   60.2 mmHg TR Vmax:        388.00 cm/s  SHUNTS Systemic VTI:  0.07 m Systemic Diam: 1.60 cm Kristeen Miss MD Electronically signed by Kristeen Miss MD Signature Date/Time: 06/22/2020/5:45:40 PM    Final    CT Angio Chest/Abd/Pel for Dissection W and/or W/WO  Result Date: 06/22/2020 CLINICAL DATA:  Unresponsive with hypotension. Lactic acidosis, elevated troponin and history of prior DVT and pulmonary embolism. History of rheumatoid arthritis with interstitial lung disease and pulmonary fibrosis. EXAM: CT ANGIOGRAPHY CHEST, ABDOMEN AND PELVIS TECHNIQUE: Non-contrast CT of the chest was initially obtained. Multidetector CT imaging through the chest, abdomen and pelvis was performed using the standard protocol during bolus administration of intravenous contrast. Multiplanar reconstructed images and MIPs were obtained and reviewed to evaluate the vascular anatomy. CONTRAST:  15mL OMNIPAQUE IOHEXOL 350 MG/ML SOLN COMPARISON:  CTA of the chest and CT of the abdomen and pelvis on 06/23/2019 FINDINGS: CTA CHEST FINDINGS Cardiovascular: Initial noncontrast CT does contain some contrast due to the fact that a CTA of the head and neck has been performed prior to the body CTA study. The thoracic aorta is of normal caliber and demonstrates no evidence of dissection or significant atherosclerosis. Visualized proximal great vessels demonstrate normal patency. The pulmonary arteries are also well opacified on the study. There is no evidence of acute or chronic pulmonary embolism. Again noted is significant cardiac enlargement primarily involving right-sided chambers with massive dilatation of the right atrium and significant right ventricular dilatation. Associated prominent reflux of contrast into the intrahepatic IVC and hepatic veins is consistent with right heart failure and elevated right  heart pressures. There is a trace amount of pericardial fluid. No significant calcified coronary artery plaque identified. Mediastinum/Nodes: Degree of lymph node enlargement involving supraclavicular, subpectoral, paratracheal, prevascular and AP window lymph nodes is similar to the prior study but potentially slightly worse. The largest right paratracheal lymph node measures approximately 1.6 cm in short axis. These are all presumably reactive lymph nodes related to underlying chronic  lung disease but underlying lymphoproliferative disease is not entirely excluded. Endotracheal tube extends to the distal trachea. Gastric decompression tube extends below the diaphragm. Lungs/Pleura: Moderate right and small left pleural effusions present. Associated atelectasis of both lower lobes, right greater than left. Underlying chronic cystic and fibrotic lung disease with probable component of pulmonary edema. No pneumothorax. Musculoskeletal: No chest wall abnormality. No acute or significant osseous findings. Review of the MIP images confirms the above findings. CTA ABDOMEN AND PELVIS FINDINGS VASCULAR Aorta: The abdominal aorta demonstrates normal patency without evidence of aneurysm or dissection. No significant atherosclerosis. Celiac: Normally patent.  Distal branch vessels are patent. SMA: Normally patent. Renals: Normally patent bilateral single renal arteries. IMA: Normally patent. Inflow: Normally patent bilateral common, internal and external iliac arteries. Normally patent bilateral common femoral arteries and femoral bifurcations. Review of the MIP images confirms the above findings. NON-VASCULAR Hepatobiliary: Liver contour is somewhat nodular and there may be a component of underlying cirrhosis. No obvious hepatic masses. The gallbladder is surrounded by some free fluid with ascites also present elsewhere in the peritoneal cavity. Pancreas: Limited evaluation on the arterial phase with no gross abnormality  identified. Spleen: Normal in size without focal abnormality. Adrenals/Urinary Tract: Arterial phase demonstrates no gross renal abnormalities. No hydronephrosis. The bladder contains a Foley catheter and is decompressed. Stomach/Bowel: There is suggestion of diffuse bowel wall congestion and edema without obstruction. No free intraperitoneal air identified. Lymphatic: No enlarged lymph nodes identified in the abdomen or pelvis. Reproductive: Uterus and bilateral adnexa are unremarkable. Other: Small volume ascites scattered throughout the peritoneal cavity. Severe anasarca involving the body wall. No discrete abscess is seen. Musculoskeletal: No acute or significant osseous findings. Review of the MIP images confirms the above findings. IMPRESSION: 1. No evidence of aortic dissection or pulmonary embolism. 2. Significant cardiac enlargement primarily involving right-sided chambers with massive dilatation of the right atrium and significant right ventricular dilatation. Associated prominent reflux of contrast into the intrahepatic IVC and hepatic veins is consistent with right heart failure and elevated right heart pressures. 3. Moderate right and small left pleural effusions with associated atelectasis of both lower lobes, right greater than left. 4. Underlying chronic cystic and fibrotic lung disease with probable component of pulmonary edema. 5. Mediastinal lymphadenopathy which is similar to the prior study but potentially slightly worse compared to the prior study. These are all presumably reactive lymph nodes related to underlying chronic lung disease but underlying lymphoproliferative disease is not entirely excluded. 6. Small volume ascites scattered throughout the peritoneal cavity. 7. Severe anasarca involving the body wall. 8. Suggestion of diffuse bowel wall congestion and edema without obstruction or perforation. 9. Somewhat nodular contour of the liver suggestive of underlying cirrhosis. Electronically  Signed   By: Irish Lack M.D.   On: 06/22/2020 16:29   CT ANGIO NECK CODE STROKE  Result Date: 06/22/2020 CLINICAL DATA:  Abdominal pain. Syncope, unresponsive. Hypotension. Hypoglycemia. Rule out dissection EXAM: CT ANGIOGRAPHY HEAD AND NECK TECHNIQUE: Multidetector CT imaging of the head and neck was performed using the standard protocol during bolus administration of intravenous contrast. Multiplanar CT image reconstructions and MIPs were obtained to evaluate the vascular anatomy. Carotid stenosis measurements (when applicable) are obtained utilizing NASCET criteria, using the distal internal carotid diameter as the denominator. CONTRAST:  5mL OMNIPAQUE IOHEXOL 350 MG/ML SOLN COMPARISON:  CT head 06/22/2020 FINDINGS: CTA NECK FINDINGS Aortic arch: Limited evaluation of the aortic arch. See separate CT angio chest report from today. Proximal great vessels widely patent.  Right carotid system: Right carotid widely patent. Negative for stenosis or dissection. Left carotid system: Left carotid widely patent without stenosis or dissection. Vertebral arteries: Both vertebral arteries widely patent to the basilar without stenosis or dissection. Skeleton: No acute skeletal abnormality. Other neck: Right jugular central venous catheter. Soft tissue swelling right neck due to hematoma. There is gas in the subcutaneous tissues bilaterally which may be venous gas from prior line placement. There is gas in the central veins. Patient is intubated with endotracheal tube above the carina. Upper chest: Chest CT reported separately Review of the MIP images confirms the above findings CTA HEAD FINDINGS Anterior circulation: Cavernous carotid patent bilaterally without atherosclerotic disease or stenosis. Anterior and middle cerebral arteries are small in caliber diffusely likely due to hypotension. There is mild to moderate stenosis of the right middle cerebral artery. Mild to moderate stenosis superior division of the left  middle cerebral artery. No large vessel occlusion or aneurysm. Posterior circulation: Small caliber posterior circulation likely due to hypotension. Both vertebral arteries patent to the basilar. Basilar patent. Superior cerebellar and posterior cerebral arteries are patent. Limited intracranial vascular evaluation due to small caliber vessels and decreased contrast enhancement. Venous sinuses: Minimal venous contrast due none to arterial phase scanning Anatomic variants: None Review of the MIP images confirms the above findings IMPRESSION: 1. No significant carotid or vertebral artery stenosis in the neck 2. Small caliber intracranial circulation anteriorly and posteriorly likely due to hypotension. This limits evaluation. There is mild to moderate stenosis in the middle cerebral arteries bilaterally. No large vessel occlusion or aneurysm identified. 3. Right jugular central venous catheter with probable surrounding hematoma. 4. Gas in the soft tissues the neck likely venous gas from IV infusion. 5. Endotracheal tube above the carina. Electronically Signed   By: Marlan Palau M.D.   On: 06/22/2020 16:11     Medications:     Scheduled Medications: . chlorhexidine gluconate (MEDLINE KIT)  15 mL Mouth Rinse BID  . Chlorhexidine Gluconate Cloth  6 each Topical Daily  . digoxin  0.125 mg Per Tube Daily  . feeding supplement (PROSource TF)  90 mL Per Tube BID  . furosemide  80 mg Intravenous TID  . insulin aspart  0-9 Units Subcutaneous Q4H  . insulin aspart  3 Units Subcutaneous Q4H  . lactulose  30 g Per Tube TID  . mouth rinse  15 mL Mouth Rinse 10 times per day  . [START ON 06/25/2020] methylPREDNISolone (SOLU-MEDROL) injection  80 mg Intravenous Daily  . pantoprazole sodium  40 mg Per Tube Daily    Infusions: . sodium chloride    . amiodarone    . feeding supplement (VITAL 1.5 CAL) 1,000 mL (06/23/20 2344)  . fentaNYL infusion INTRAVENOUS 100 mcg/hr (06/24/20 0800)  . heparin 900 Units/hr  (06/24/20 0800)  . methylPREDNISolone (SOLU-MEDROL) injection Stopped (06/23/20 2224)  . milrinone 0.125 mcg/kg/min (06/24/20 0800)  . norepinephrine (LEVOPHED) Adult infusion 6 mcg/min (06/24/20 0800)  . piperacillin-tazobactam (ZOSYN)  IV Stopped (06/24/20 8015)    PRN Medications: Place/Maintain arterial line **AND** sodium chloride, docusate, fentaNYL, midazolam, midazolam, polyethylene glycol    Plan/Discussion:    1. 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  - RH cath in 12/20 with mild PAH with normal RV function. - Echo 06/22/20 Echo LV small compressed from RV EF 45-50% RV massively dilated and severely hypokinetic. Severe TR RVSP 76 Severe D-sign. Moderate pericardial effusion  - PFTs much better  than I expected based on CT chest. 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 - She may also have component of CTEPH with recent PE but CT this admit negative for acute PE. Will eventually need repeat RHC and VQ - Continues w/ evidence of severe RHF and volume overload. - Continue to support RV with milrinone and digoxin.  - Push diuresis. Increase IV Lasix to 80 tid, aggressively supp K  - Initial CTD serology a bit underwhelming but I am still concerned for a flare as the unifying diagnosis  - Continue solumedrol 1g burst x 3 days.  - Plan RHC next week to further adjust meds.  - Can use iNO as needed though given degree of underlying lung disease have to watch closely for shunting  2. Acute hypoxic respiratory failure - multifactorial including PAH, ILD, large R effusion, pulmonary edema and possible aspiration  - CCM managing - support RV as above.  - continue milrinone and diuresis  3. 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 On  lactulose  4. Profound hypoglycemia - suspect related to liver failure  5. CTD with h/o RA and SLE - As above, I am concerned about possible CTD flare. Will treat with solumedrol 1 gram burst x 3 days for now - follow serology  6. DVT/PE - diagnosed 6/21 at [place] - CT this admit. Negative for acute PE - continue heparin   7. ? VT vs Afib w/ Aberrancy  - V-rates 170s-190s - on dual inotrope's w/ milrinone + NE  - start IV amio w/ 150 mg bolus followed by gtt at 60/hr (discussed w/ Dr. Lynetta Mare at bedside)  - aggressively supp K (2.9) discussed w/ pharmacy. Mg stable 2.1  - keep K > 4, Mg >2  - try to wean NE as tolerated, can use midodrine per tube to help facilitate if needed  - continue IV heparin   8. Hypokalemia - K 2.9 - give 3 runs IV K + 40 PO tid - repeat BMP @ 1500     Length of Stay: 2   Brittainy Simmons 06/24/2020, 9:03 AM  Advanced Heart Failure Team Pager [phone] (M-F; 7a - 4p)  Please contact [facility] for night-coverage after hours (4p -7a ) and weekends on [email or web address]  Agree with above.   Events of yesterday noted with hypotension and hypoxia requiring initiation of NE in addition to milrinone. EEG with ? Seizure activity. This am had atrial tach/AFL and IV amio started.   Remains sedated on vent on NE and milrinone. Co-ox 79% Creatinine 1.5    Inflammatory markers notable only for markedly elevated RF. ANA titers pending.   General:  Ill appearing. Sedated on vent HEENT: normal + ETT Neck: supple. JVP to jaw. Carotids 2+ bilat; no bruits. No lymphadenopathy or thryomegaly appreciated. Cor: PMI nondisplaced. Regular rate + RV lift Lungs: clear Abdomen: soft, nontender, ++ distended. No hepatosplenomegaly. No bruits or masses. Good bowel sounds. Extremities: no cyanosis, clubbing, rash, 3+ edema wound on RLE Neuro: intubated sedated  She remains critically ill. Inflammatory markers not convincing for CTD flare. She has severe RV failure  and agree with inotropic support. May need to consider increasing milrinone or adding iNO soon. Continue diuresis. Will need RHC when improved. Continue amio. Supp electrolytes.   CRITICAL CARE Performed by: Glori Bickers  Total critical care time: 35 minutes  Critical care time was exclusive of separately billable procedures and treating other patients.  Critical care was necessary to treat or prevent imminent or life-threatening deterioration.  Critical care was time spent personally by me (independent of midlevel providers or residents) on the following activities: development of treatment plan with patient and/or surrogate as well as nursing, discussions with consultants, evaluation of patient's response to treatment, examination of patient, obtaining history from patient or surrogate, ordering and performing treatments and interventions, ordering and review of laboratory studies, ordering and review of radiographic studies, pulse oximetry and re-evaluation of patient's condition.  Glori Bickers, MD  5:34 PM

## 2020-06-24 NOTE — Procedures (Addendum)
Patient Name: Carolyn Proctor  MRN: [id number]  Epilepsy Attending: [name]  Referring Physician/Provider: Dr Kipp Brood Duration: 06/23/2020 1411 to 06/24/2020 1411  Patient history: 49o F with ams. EEG to evaluate for seizure.  Level of alertness: awake, asleep/sedated  AEDs during EEG study: None  Technical aspects: This EEG study was done with scalp electrodes positioned according to the 10-20 International system of electrode placement. Electrical activity was acquired at a sampling rate of 500Hz  and reviewed with a high frequency filter of 70Hz  and a low frequency filter of 1Hz . EEG data were recorded continuously and digitally stored.   Description: EEG showed continuous generalized 3 to 6 Hz theta-delta slowing admixed with 13-15Hz  beta activity in frontocentral region. left anterior temporal, maximal F7 spikes were also noted. Hyperventilation and photic stimulation were not performed.     Event button was pressed on 06/23/2020 at 1747 for unclear reason. On video patient was noted to open her eyes. Concomitant eeg before, during and after the event didn't show an eeg change to suggest seizure.  ABNORMALITY - Spikes, left anterior temporal - Continuous slow, generalized  IMPRESSION: This study showed evidence of potential epileptogenicity from left anterior temporal region as well as  severe diffuse encephalopathy, nonspecific etiology but likely related to sedation. No seizures were seen throughout the recording.  Event button was pressed on 06/23/2020 at 1747 as described above without concomitant eeg change and was likely not epileptic.   Tifani Dack [name]

## 2020-06-24 NOTE — Progress Notes (Signed)
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), Afib, protein C deficiency  No Known Allergies  Patient Measurements: Height: 5\' 11"  (180.3 cm) Weight: 77.9 kg (171 lb 11.8 oz) IBW/kg (Calculated) : 70.8 Heparin Dosing Weight: 74.2 kg  Vital Signs: Temp: 97.5 F (36.4 C) (09/17 1800) BP: 106/72 (09/17 1800) Pulse Rate: 109 (09/17 2101)  Labs: Recent Labs    06/22/20 1139 06/22/20 1140 06/22/20 1358 06/22/20 1508 06/23/20 0534 06/23/20 0534 06/23/20 0541 06/23/20 0541 06/23/20 1011 06/23/20 1316 06/23/20 1716 06/24/20 0119 06/24/20 0415 06/24/20 0743 06/24/20 0919 06/24/20 1552 06/24/20 1555  HGB 13.2   < >  --    < > 12.5   < > 14.3   < >  --  15.6*  --   --  12.2  --   --   --   --   HCT 46.3*   < >  --    < > 38.3   < > 42.0  --   --  46.0  --   --  37.2  --   --   --   --   PLT 248  --   --   --  254  --   --   --   --   --   --   --  294  --   --   --   --   APTT  --   --   --   --   --   --   --   --  35  --    < > 138*  --   --  89* 58*  --   LABPROT  --   --  42.6*  --  29.6*  --   --   --   --   --   --   --   --   --   --   --   --   INR  --   --  4.7*  --  2.9*  --   --   --   --   --   --   --   --   --   --   --   --   HEPARINUNFRC  --   --   --   --   --   --   --   --  1.86*  --   --  2.08*  --   --   --   --   --   CREATININE 1.38*   < >  --    < > 1.32*   < >  --   --  1.45*  --   --   --   --  1.54*  --   --  1.57*  TROPONINIHS 34*  --  125*  --   --   --   --   --   --   --   --   --   --   --   --   --   --    < > = values in this interval not displayed.    Estimated Creatinine Clearance: 44.7 mL/min (A) (by C-G formula based on SCr of 1.57 mg/dL (H)).  Medications:  Infusions:  . sodium chloride    . amiodarone 30 mg/hr (06/24/20 2045)  . feeding supplement (VITAL 1.5 CAL) 1,000 mL (06/23/20 2344)  . fentaNYL infusion INTRAVENOUS 100  mcg/hr (06/24/20 2000)  . furosemide  (LASIX) infusion 10 mg/hr (06/24/20 1900)  . heparin 900 Units/hr (06/24/20 1900)  . levETIRAcetam Stopped (06/24/20 1550)  . methylPREDNISolone (SOLU-MEDROL) injection Stopped (06/23/20 2224)  . milrinone 0.125 mcg/kg/min (06/24/20 1900)  . norepinephrine (LEVOPHED) Adult infusion 8 mcg/min (06/24/20 1900)  . piperacillin-tazobactam (ZOSYN)  IV 12.5 mL/hr at 06/24/20 1900    Assessment: The patient was on Apixaban PTA for recent hx DVT/PE in June 2021, also with hx Afib with the last dose 9/14 PM. Now on hold and on Heparin for bridging.  The patient's aPTT 58sec < goal on heparin drip 900 uts/hr. CBC stable - no bleeding noted at this time.   Goal of Therapy:  Heparin level 0.3-0.7 units/ml aPTT 66-102 seconds Monitor platelets by anticoagulation protocol: Yes   Plan:  - Increase Heparin drip 1000 units/hr - Daily HL/aPTT until correlated - Will continue to monitor for any signs/symptoms of bleeding and will follow up with heparin level in 6 hours to confirm therapeutic   [place].D. CPP, BCPS Clinical Pharmacist [phone] 06/24/2020 9:04 PM     **Pharmacist phone directory can now be found on [email or web address] ([email or web address]).  Listed under [place].

## 2020-06-24 NOTE — Progress Notes (Signed)
LTM maint complete - no skin breakdown under: Fp1 F3 F7

## 2020-06-24 NOTE — Plan of Care (Signed)
  Problem: Nutrition: Goal: Adequate nutrition will be maintained Outcome: Progressing   

## 2020-06-24 NOTE — Progress Notes (Signed)
ANTICOAGULATION CONSULT NOTE - Follow Up Consult  Pharmacy Consult for heparin Indication: Afib and recent VTE w/ protein C deficiency  Labs: Recent Labs    06/22/20 1139 06/22/20 1140 06/22/20 1358 06/22/20 1508 06/22/20 2110 06/22/20 2215 06/23/20 0534 06/23/20 0534 06/23/20 0541 06/23/20 1011 06/23/20 1316 06/23/20 1716 06/24/20 0119  HGB 13.2   < >  --    < >  --    < > 12.5   < > 14.3  --  15.6*  --   --   HCT 46.3*   < >  --    < >  --    < > 38.3  --  42.0  --  46.0  --   --   PLT 248  --   --   --   --   --  254  --   --   --   --   --   --   APTT  --   --   --   --   --   --   --   --   --  35  --  105* 138*  LABPROT  --   --  42.6*  --   --   --  29.6*  --   --   --   --   --   --   INR  --   --  4.7*  --   --   --  2.9*  --   --   --   --   --   --   HEPARINUNFRC  --   --   --   --   --   --   --   --   --  1.86*  --   --  2.08*  CREATININE 1.38*   < >  --    < > 1.36*  --  1.32*  --   --  1.45*  --   --   --   TROPONINIHS 34*  --  125*  --   --   --   --   --   --   --   --   --   --    < > = values in this interval not displayed.    Assessment: 59yo female supratherapeutic on heparin with higher PTT despite rate decrease though initial lab was drawn early and so that lab was not truly accurate; no gtt issues or signs of bleeding per RN.  Goal of Therapy:  aPTT 66-102 seconds   Plan:  Will decrease heparin gtt by 3 units/kg/hr to 900 units/hr and check PTT in 8 hours.    Wynona Neat, PharmD, BCPS  06/24/2020,2:21 AM

## 2020-06-25 ENCOUNTER — Inpatient Hospital Stay (HOSPITAL_COMMUNITY): Payer: BC Managed Care – PPO

## 2020-06-25 DIAGNOSIS — N390 Urinary tract infection, site not specified: Secondary | ICD-10-CM

## 2020-06-25 DIAGNOSIS — N170 Acute kidney failure with tubular necrosis: Secondary | ICD-10-CM

## 2020-06-25 DIAGNOSIS — R0902 Hypoxemia: Secondary | ICD-10-CM | POA: Diagnosis not present

## 2020-06-25 DIAGNOSIS — I50810 Right heart failure, unspecified: Secondary | ICD-10-CM

## 2020-06-25 DIAGNOSIS — J9600 Acute respiratory failure, unspecified whether with hypoxia or hypercapnia: Secondary | ICD-10-CM | POA: Diagnosis not present

## 2020-06-25 DIAGNOSIS — R570 Cardiogenic shock: Secondary | ICD-10-CM | POA: Diagnosis not present

## 2020-06-25 DIAGNOSIS — I272 Pulmonary hypertension, unspecified: Secondary | ICD-10-CM

## 2020-06-25 DIAGNOSIS — B9620 Unspecified Escherichia coli [E. coli] as the cause of diseases classified elsewhere: Secondary | ICD-10-CM

## 2020-06-25 LAB — CBC
HCT: 37.4 % (ref 36.0–46.0)
Hemoglobin: 12.2 g/dL (ref 12.0–15.0)
MCH: 32.3 pg (ref 26.0–34.0)
MCHC: 32.6 g/dL (ref 30.0–36.0)
MCV: 98.9 fL (ref 80.0–100.0)
Platelets: 260 10*3/uL (ref 150–400)
RBC: 3.78 MIL/uL — ABNORMAL LOW (ref 3.87–5.11)
RDW: 19.4 % — ABNORMAL HIGH (ref 11.5–15.5)
WBC: 20.4 10*3/uL — ABNORMAL HIGH (ref 4.0–10.5)
nRBC: 0.1 % (ref 0.0–0.2)

## 2020-06-25 LAB — URINE CULTURE: Culture: >=100000 — AB

## 2020-06-25 LAB — COMPREHENSIVE METABOLIC PANEL
ALT: 20 U/L (ref 0–44)
AST: 24 U/L (ref 15–41)
Albumin: 1.9 g/dL — ABNORMAL LOW (ref 3.5–5.0)
Alkaline Phosphatase: 103 U/L (ref 38–126)
Anion gap: 14 (ref 5–15)
BUN: 30 mg/dL — ABNORMAL HIGH (ref 6–20)
CO2: 22 mmol/L (ref 22–32)
Calcium: 7.9 mg/dL — ABNORMAL LOW (ref 8.9–10.3)
Chloride: 101 mmol/L (ref 98–111)
Creatinine, Ser: 1.72 mg/dL — ABNORMAL HIGH (ref 0.44–1.00)
GFR calc Af Amer: 38 mL/min — ABNORMAL LOW (ref >60)
GFR calc non Af Amer: 33 mL/min — ABNORMAL LOW (ref >60)
Glucose, Bld: 190 mg/dL — ABNORMAL HIGH (ref 70–99)
Potassium: 3.9 mmol/L (ref 3.5–5.1)
Sodium: 137 mmol/L (ref 135–145)
Total Bilirubin: 1.5 mg/dL — ABNORMAL HIGH (ref 0.3–1.2)
Total Protein: 6.3 g/dL — ABNORMAL LOW (ref 6.5–8.1)

## 2020-06-25 LAB — GLUCOSE, CAPILLARY
Glucose-Capillary: 75 mg/dL (ref 70–99)
Glucose-Capillary: 159 mg/dL — ABNORMAL HIGH (ref 70–99)
Glucose-Capillary: 215 mg/dL — ABNORMAL HIGH (ref 70–99)
Glucose-Capillary: 140 mg/dL — ABNORMAL HIGH (ref 70–99)
Glucose-Capillary: 127 mg/dL — ABNORMAL HIGH (ref 70–99)
Glucose-Capillary: 172 mg/dL — ABNORMAL HIGH (ref 70–99)

## 2020-06-25 LAB — CULTURE, RESPIRATORY W GRAM STAIN: Culture: NORMAL

## 2020-06-25 LAB — COMPLEMENT, TOTAL: Compl, Total (CH50): 19 U/mL — ABNORMAL LOW (ref >41)

## 2020-06-25 LAB — APTT
aPTT: 93 seconds — ABNORMAL HIGH (ref 24–36)
aPTT: 70 seconds — ABNORMAL HIGH (ref 24–36)

## 2020-06-25 LAB — HEPARIN LEVEL (UNFRACTIONATED): Heparin Unfractionated: 1.02 IU/mL — ABNORMAL HIGH (ref 0.30–0.70)

## 2020-06-25 LAB — CYCLIC CITRUL PEPTIDE ANTIBODY, IGG/IGA: CCP Antibodies IgG/IgA: >250 units — ABNORMAL HIGH (ref 0–19)

## 2020-06-25 MED ORDER — FUROSEMIDE 10 MG/ML IJ SOLN
120.0000 mg | Freq: Once | INTRAVENOUS | Status: AC
  Administered 2020-06-25: 120 mg via INTRAVENOUS
  Filled 2020-06-25: qty 2

## 2020-06-25 MED ORDER — INFLUENZA VAC SPLIT QUAD 0.5 ML IM SUSY
0.5000 mL | PREFILLED_SYRINGE | INTRAMUSCULAR | Status: DC
  Filled 2020-06-25 (×3): qty 0.5

## 2020-06-25 MED ORDER — METHYLPREDNISOLONE SODIUM SUCC 125 MG IJ SOLR: 80.0000 mg | Freq: Every day | INTRAMUSCULAR | Status: DC

## 2020-06-25 MED ORDER — MILRINONE LACTATE IN DEXTROSE 20-5 MG/100ML-% IV SOLN
0.1250 ug/kg/min | INTRAVENOUS | Status: DC
  Administered 2020-06-25 – 2020-07-03 (×8): 0.125 ug/kg/min via INTRAVENOUS
  Filled 2020-06-25 (×6): qty 100

## 2020-06-25 MED ORDER — METHYLPREDNISOLONE SODIUM SUCC 125 MG IJ SOLR
80.0000 mg | Freq: Every day | INTRAMUSCULAR | Status: AC
  Administered 2020-06-26 – 2020-06-29 (×4): 80 mg via INTRAVENOUS
  Filled 2020-06-25 (×4): qty 2

## 2020-06-25 MED ORDER — AMIODARONE HCL 200 MG PO TABS
400.0000 mg | ORAL_TABLET | Freq: Three times a day (TID) | ORAL | Status: DC
  Administered 2020-06-25 – 2020-06-26 (×4): 400 mg
  Filled 2020-06-25 (×4): qty 2

## 2020-06-25 MED ORDER — DEXTROSE 5 % IV SOLN
500.0000 mg | Freq: Once | INTRAVENOUS | Status: AC
  Administered 2020-06-25: 500 mg via INTRAVENOUS
  Filled 2020-06-25: qty 500

## 2020-06-25 NOTE — Procedures (Addendum)
Patient Name:Carolyn Proctor QDI:[id number] Epilepsy Attending:Riaz Onorato [name] Referring Physician/Provider:Dr Kipp Brood Duration:06/24/2020 1411 to 06/25/2020 0944  Patient history:58o F with ams. EEG to evaluate for seizure.  Level of alertness:awake, asleep/sedated  AEDs during EEG study:keppra  Technical aspects: This EEG study was done with scalp electrodes positioned according to the 10-20 International system of electrode placement. Electrical activity was acquired at a sampling rate of 500Hz  and reviewed with a high frequency filter of 70Hz  and a low frequency filter of 1Hz . EEG data were recorded continuously and digitally stored.   Description: EEG showed continuous generalized 3 to 6 Hz theta-delta slowingadmixed with 13-15Hz  beta activity in frontocentral region.left anterior temporal, maximal F7 spikes were also noted. Hyperventilation and photic stimulation were not performed.   ABNORMALITY - Spikes, left anterior temporal - Continuousslow, generalized  IMPRESSION: This study showed evidence of potential epileptogenicity from left anterior temporal region as well as  severe diffuse encephalopathy, nonspecific etiology but likely related to sedation. No seizures were seen throughout the recording.  Carolyn Proctor [name]

## 2020-06-25 NOTE — Progress Notes (Signed)
Subjective: No further seizures  Exam: Vitals:   06/25/20 0900 06/25/20 1000  BP: (!) 82/57 109/71  Pulse: 86 (!) 103  Resp: (!) 4 10  Temp: (!) 97.5 F (36.4 C) 98.1 F (36.7 C)  SpO2: 100% 99%   Gen: In bed, NAD Resp: non-labored breathing, no acute distress Abd: soft, nt  Neuro: MS: awake, follows some simple commands MB:[id number], VOR intact, but does not look either left or right.  Motor: moves all extremities spontaneously and to commands.  Sensory:endorses sensation bilaterally.   Pertinent Labs: eGFR 38  Impression: 59 year old female with new onset seizures in the setting of respiratory failure due to interstitial lung disease with decompensated CHF with cardiogenic shock.  Given the presence of interictal epileptiform discharges on EEG, she will need to be maintained on antiepileptic therapy, likely indefinitely.  Also given the focal nature and the new onset of her seizures, she will need further imaging with MRI.  She has not had seizures and multiple days of recording, and therefore we can discontinue her continuous EEG at this time.  Recommendations: 1) MRI brain 2) discontinue continuous EEG 3) continue Keppra 500 twice daily.  Roland Rack, MD [facility] [phone]  If 7pm- 7am, please page neurology on call as listed in [place].

## 2020-06-25 NOTE — Progress Notes (Signed)
LTM maint complete - no skin breakdown under: F2 F4 Fz

## 2020-06-25 NOTE — Progress Notes (Signed)
NAME:  Carolyn Proctor, MRN:  [id number], DOB:  07-03-1961, LOS: 3 ADMISSION DATE:  06/22/2020, CONSULTATION DATE:  06/22/2020 REFERRING MD:  Dr. Tyrone Nine, CHIEF COMPLAINT:  AMS/ refractory hypoglycemia  Brief History   84 yoF at home who complained to family she felt like she was having a stroke, then developed left facial droop and went unresponsive.  Required BVM by EMS and noted to be hypoglycemic and hypotensive.  Intubated in ER for airway protection.  CTH negative.  Refractory hypoglycemia despite multiple D50 amps now on drip.  PCCM called to admit.   History of present illness   HPI obtained from medical chart review and daughter, Carolyn Proctor at beside, as patient is currently intubated and sedated on mechanical ventilation.  59 year old female with prior history of PAF, DVT, and PE on Eliquis, RA,  hypercoagulable state (increased protein S antigen, decreased protein C activity, and decreased Antithrombin III), HFpEF, CKD stage III, centrilobular emphysema and pulmonary fibrosis on daily prednisone- never smoker, chronic venous insufficiency w/ chronic foot ulcers, and diverticulitis.   Daughter reports patient has not felt well since her hospitalization in June at [facility] where she was diagnosed with PE/ DVT and placed on Eliquis in which daughter states she is compliant with.  Since, patient also has been struggling with weight loss, early satiety, muscle weakness, and ongoing lower leg edema/ wounds.  Seen by GI and put on miralax per daughter for constipation.  Daughter also reports her prednisone has been recently tapered.    Patient was at home this morning around 1020, when she reported to her family that she felt like she was having a stroke. Family noticed a left facial droop and then patient proceeded to become unresponsive.  On EMS arrival, patient required continuous BVM. CBG noted 49, afebrile and was hypotensive requiring fluid bolus.  On arrival to ER, she remained altered  and required intubation for airway protection. 2L NS bolus given for hypotension.  She remained hypoglycemic despite multiple D50 requiring D10 gtt.  A right IJ central line was placed in ER. Labs noted for normal WBC/ Hgb, Na 132, CO2 10, sCr 1.38, AG 19, albumin 2.2, normal AST/ ALT, t. Bili 2.9, trop hs 34, LA 10.8, ETOH neg, ammonia 157. CT head without acute process.  CXR showing satisfactory position of ETT, layering right pleural effusion, diffuse interstitial thickening c/w hx of fibrosis.  ABG post intubation 6.954/ 43.4/ 31.3/ 9.6.  Patient remained normotensive but temp 95 requiring bairhugger.  PCCM called for admit.   Past Medical History  PAF, DVT, PE on Eliquis HFpEF CKD stage III RA Hypercoagulable state (increased protein S antigen, decreased protein C activity, and decreased Antithrombin III) Centrilobular emphysema and pulmonary fibrosis- non smoker Chronic venous insufficiency w/ chronic foot ulcers  Diverticulitis   Significant Hospital Events   9/15 admitted/ intubated  Consults:   Procedures:  9/15 ETT >> 9/15 R IJ TL CVC >> 9/15 OGT/ foley  Significant Diagnostic Tests:  9/15 CTH >> negative  9/15 CXR >> ETT 5.3 above carina, layering right pleural effusion, diffuse interstitial thickening c/w hx of fibrosis  9/15 CTA chest/ abd/ pelvis >> significant dilatation of RA and RV consistent with R heart failure; moderate R and small L pleural effusions with associated atelectasis of both lower lobes; underlying chronic cystic and fibrotic lung disease; severe anasarca; nodular contour of liver suggestive of possible cirrhosis 9/15 CTA head and neck >> no significant carotid or vertebral artery stenosis in neck,  no LVO, right jugular central venous catheter with probable surrounding hematoma  9/16 CXR >> stable mild b/l pulmonary edema with small pleural effusions. No PTX noted  Micro Data:  9/15 BCx2 >> 9/15 SARS2 >> neg 9/15 UC >> E coli  Antimicrobials:    9/15 vanc >> 9/17 9/15 zosyn >>  Interim history/subjective:  Mental status improved compared to yesterday. Able to follow simple commands such as squeezing fingers and wiggling toes.  Profoundly volume overloaded and low UOP despite lasix drip. Will plan for more aggressive diuresis today.  Will plan for MRI today as per neuro.  Objective   Blood pressure 109/71, pulse (!) 103, temperature 98.1 F (36.7 C), resp. rate 10, height '5\' 11"'  (1.803 m), weight 77.9 kg, last menstrual period 02/19/2013, SpO2 99 %. CVP:  [21 mmHg-30 mmHg] 30 mmHg  Vent Mode: PRVC FiO2 (%):  [40 %] 40 % Set Rate:  [28 bmp] 28 bmp Vt Set:  [500 mL] 500 mL PEEP:  [5 cmH20] 5 cmH20 Plateau Pressure:  [24 cmH20-25 cmH20] 25 cmH20   Intake/Output Summary (Last 24 hours) at 06/25/2020 1019 Last data filed at 06/25/2020 1000 Gross per 24 hour  Intake 3277.61 ml  Output 823 ml  Net 2454.61 ml   Filed Weights   06/23/20 0500 06/24/20 0408 06/25/20 0500  Weight: 74.2 kg 77.9 kg 77.9 kg    Examination: General: ill-appearing adult female, intubated and mechanically ventilated, in NAD. HENT: mucous membranes pink but slightly dry, ETT and OGT in place.  Eyes: Pupils sluggishly reactive to light. CV: regular rate and irregular rhythm. 2/6 systolic murmur/TR noted. Pulm: mechanically ventilated breath sounds bilaterally, bibasilar crackles noted. GI: slightly firm and slightly distended abdomen. +BS. Neuro: Opens eyes to voice and is able to appropriately blink eyes to yes or no questions. Weak hand grip bilaterally. Able to wiggle toes bilaterally. Unable to raise head off of bed. 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 CXR showing persistent bilateral pulmonary edema. - full MV support, PRVC 6-8 cc/kg, FiO2 40% - wean FiO2 for sat goal > 90 - VAP bundle/PPI   - discontinued milrinone drip - completed 3 days of pulse dose steroids - will begin steroid taper with 5 day course of solumedrol 39m, then transition to prednisone 453m- sputum cx showing rare GPC in pairs consistent with normal respiratory flora, no pseudomonas - leukocytosis (WBC 20) likely from steroid therapy - on zosyn for UTI - f/u blood cx  Cardiogenic shock 2/2 severe RV failure/cor pulmonale Pulmonary edema from fluid overload Hx of HFpEF, HTN Profoundly volume overloaded. - cardiology following - ECHO 9/15 showing LVEF 45-50% with compression of LV from RV; RV massively dilated and severely hypokinetic; severe tricuspid regurg with RVSP 76; moderate pericardial effusion - goal map > 65 - f/u blood cx - discontinued milrinone - off vasopressors, stable BP at this time - did not respond to lasix drip yesterday, will require more aggressive diuresis - increased lasix drip to 20cc/hr - give lasix bolus 12026m give diuril bolus 500m69mce  Encephalopathy Seizure Initial stroke symptoms most likely attributed to hypoglycemia but given hypercoagulable hx needs further workup.  CTH neg for acute changes or bleed.  - CTA  head/neck not showing any acute findings - EEG showing potential epileptogenicity from left anterior temporal region and severe diffuse encephalopathy (nonspecific etiology), but no signs of seizure activity. - neurology following - stopped LTM - MRI today, as per neuro - serial neuro checks  VT vs Afib w/Aberrancy - ventricular rates 170s - 190s - stopped milrinone and off levo - tele monitoring  - stopped amiodarone drip and transitioned to PO amiodarone 473m TID - continue heparin ppx as per pharmacy  Hypokalemia - K 3.9 today - receiving lasix so will need to continue monitoring BMP and  replacing electrolytes as needed  UTI - Urine culture showing >100,000 colonies of E. Coli, sensitive to zosyn - continue zosyn  CKD stage III Possible cardiorenal syndrome in the setting of RV failure Baseline sCr ~1.15 - 1.39. - Cr 1.54 --> 1.72 - foley in place, only 0.9L UOP despite diuresis - trend BMP - strict I/Os, trend renal indices   Hyperammonemia, unclear etiology given normal LFTs except t.bili 2.9,  - CT abd/ pelvis showing somewhat nodular contour of liver, suggestive of possible cirrhosis - will stop lactulose at this time due to multiple loose stools  - trend LFTs  Hx DVT, PE, and +hypercoagulable workup - checking coags - continue heparin ppx as per pharmacy  Prolonged QT - avoid QTc prolonging agents - tele monitoring  Best practice:  Diet: NPO, TF Pain/Anxiety/Delirium protocol (if indicated): fentanyl/versed VAP protocol (if indicated): yes DVT prophylaxis: Heparin per pharmacy GI prophylaxis: PPI Glucose control: hourly and prn still stable Mobility: bedrest Code Status: full  Family Communication: Daughter, MAlwyn [phone] updated at bedside Disposition: ICU  Labs   CBC: Recent Labs  Lab 06/22/20 1139 06/22/20 1140 06/23/20 0534 06/23/20 0541 06/23/20 1316 06/24/20 0415 06/25/20 0415  WBC 9.3  --  9.4  --   --  17.5* 20.4*  NEUTROABS 7.4  --   --   --   --   --   --   HGB 13.2   < > 12.5 14.3 15.6* 12.2 12.2  HCT 46.3*   < > 38.3 42.0 46.0 37.2 37.4  MCV 111.0*  --  96.7  --   --  97.1 98.9  PLT 248  --  254  --   --  294 260   < > = values in this interval not displayed.    Basic Metabolic Panel: Recent Labs  Lab 06/22/20 1358 06/22/20 1508 06/23/20 0534 06/23/20 0541 06/23/20 1011 06/23/20 1316 06/23/20 1716 06/24/20 0415 06/24/20 0743 06/24/20 1555 06/25/20 0805  NA  --    < > 133*   < > 134* 133*  --   --  136 137 137  K  --    < > 3.1*   < > 3.7 4.0  --   --  2.9* 3.8 3.9  CL  --    < > 99  --  99  --   --    --  98 100 101  CO2  --    < > 23  --  23  --   --   --  '23 23 22  ' GLUCOSE  --    < > 220*  --  232*  --   --   --  226* 167* 190*  BUN  --    < > 16  --  15  --   --   --  23* 25* 30*  CREATININE  --    < >  1.32*  --  1.45*  --   --   --  1.54* 1.57* 1.72*  CALCIUM  --    < > 7.2*  --  7.4*  --   --   --  7.4* 7.6* 7.9*  MG 1.5*  --   --   --  1.4*  --  1.5* 2.1  --  2.7*  --   PHOS 5.7*  --   --   --  3.4  --  3.2 3.7  --  3.1  --    < > = values in this interval not displayed.   GFR: Estimated Creatinine Clearance: 40.8 mL/min (A) (by C-G formula based on SCr of 1.72 mg/dL (H)). Recent Labs  Lab 06/22/20 1139 06/22/20 1147 06/22/20 1429 06/23/20 0534 06/23/20 1352 06/23/20 2214 06/24/20 0415 06/24/20 1552 06/25/20 0415  PROCALCITON  --   --   --  11.22  --   --   --   --   --   WBC 9.3  --   --  9.4  --   --  17.5*  --  20.4*  LATICACIDVEN  --    < > 5.8*  --  7.9* 4.9*  --  3.1*  --    < > = values in this interval not displayed.    Liver Function Tests: Recent Labs  Lab 06/22/20 1139 06/23/20 0534 06/25/20 0805  AST 37 43* 24  ALT '15 20 20  ' ALKPHOS 112 91 103  BILITOT 2.9* 2.1* 1.5*  PROT 6.9 5.2* 6.3*  ALBUMIN 2.2* 1.7* 1.9*   No results for input(s): LIPASE, AMYLASE in the last 168 hours. Recent Labs  Lab 06/22/20 1147  AMMONIA 157*    ABG    Component Value Date/Time   PHART 7.271 (L) 06/23/2020 1316   PCO2ART 33.0 06/23/2020 1316   PO2ART 357 (H) 06/23/2020 1316   HCO3 15.2 (L) 06/23/2020 1316   TCO2 16 (L) 06/23/2020 1316   ACIDBASEDEF 11.0 (H) 06/23/2020 1316   O2SAT 79.4 06/24/2020 0415     Coagulation Profile: Recent Labs  Lab 06/22/20 1358 06/23/20 0534  INR 4.7* 2.9*    Cardiac Enzymes: No results for input(s): CKTOTAL, CKMB, CKMBINDEX, TROPONINI in the last 168 hours.  HbA1C: Hgb A1c MFr Bld  Date/Time Value Ref Range Status  06/23/2020 05:34 AM 5.5 4.8 - 5.6 % Final    Comment:    (NOTE) Pre diabetes:           5.7%-6.4%  Diabetes:              >6.4%  Glycemic control for   <7.0% adults with diabetes   07/17/2007 02:46 PM   Final   5.3 (NOTE)   The ADA recommends the following therapeutic goals for glycemic   control related to Hgb A1C measurement:   Goal of Therapy:   < 7.0% Hgb A1C   Action Suggested:  > 8.0% Hgb A1C   Ref:  Diabetes Care, 22, Suppl. 1, 1999    CBG: Recent Labs  Lab 06/24/20 1538 06/24/20 1946 06/24/20 2343 06/25/20 0325 06/25/20 0835  GLUCAP 150* 152* 79 75 159*    Review of Systems:   Unable to perform  Past Medical History  She,  has a past medical history of Acute myopericarditis (02/24/2013), Atrial fibrillation ([place]), Atrial fibrillation with RVR ([place]) (02/22/2013), Chronic anticoagulation, Xarelto (04/02/2013), Chronic diastolic CHF (congestive heart failure) ([place]) (06/26/2019), Diverticulitis, Elevated troponin- secondary to Myopericarditis (02/25/2013), Emphysema  (07/07/2019),  Hypertension, Lupus ([place]) (09/14/2011), 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   Surgical History    Past Surgical History:  Procedure Laterality Date  . BONE MARROW BIOPSY  2010  . CARDIAC CATHETERIZATION    . LEFT HEART CATHETERIZATION WITH CORONARY ANGIOGRAM N/A 02/24/2013   Procedure: LEFT HEART CATHETERIZATION WITH CORONARY ANGIOGRAM;  Surgeon: Leonie Man, MD;  Location: [facility] CATH LAB;  Service: Cardiovascular;  Laterality: N/A;  . RIGHT/LEFT HEART CATH AND CORONARY ANGIOGRAPHY N/A 09/09/2019   Procedure: RIGHT/LEFT HEART CATH AND CORONARY ANGIOGRAPHY;  Surgeon: Jettie Booze, MD;  Location:  [place] CV LAB;  Service: Cardiovascular;  Laterality: N/A;     Social History   reports that she has never smoked. She has never used smokeless tobacco. She reports that she does not drink alcohol and does not use drugs.   Family History   Her family history includes Hypertension in her maternal grandmother; Liver disease in her mother; Lupus in her brother and mother.   Allergies No Known Allergies   Home Medications  Prior to Admission medications   Medication Sig Start Date End Date Taking? Authorizing Provider  albuterol (VENTOLIN HFA) 108 (90 Base) MCG/ACT inhaler Inhale 2 puffs into the lungs every 6 (six) hours as needed for wheezing or shortness of breath. 06/26/19   [place] Donnamarie Poag, DO  Calcium Carbonate-Vit D-Min (CALCIUM 1200 PO) Take 1 capsule by mouth daily.    [provider]  Cholecalciferol (VITAMIN D) 50 MCG (2000 UT) tablet Take 2,000 Units by mouth daily.     [provider]  Cyanocobalamin (VITAMIN B-12) 1000 MCG SUBL Place 1,000 mcg under the tongue.    [provider]  furosemide (LASIX) 20 MG tablet Take 1 tablet (20 mg total) by mouth 3 (three) times a week. 09/23/19   Jettie Booze, MD  pantoprazole (PROTONIX) 40 MG tablet Take 1 tablet (40 mg total) by mouth daily. 09/28/19 12/27/19  Margaretha Seeds, MD  predniSONE ([place]) 5 MG tablet TAKE 2 TABLETS BY MOUTH EVERY DAY 01/18/20   Margaretha Seeds, MD     Critical care time: 46 mins     Virl Axe, MD 06/25/2020, 10:19 AM Pager: [phone]

## 2020-06-25 NOTE — Progress Notes (Signed)
LTM EEG discontinued - no skin breakdown at unhook.   

## 2020-06-25 NOTE — Progress Notes (Signed)
RT transported patient from 4N31 to MRI and back with RN. No complications. RT will continue to monitor.

## 2020-06-25 NOTE — Progress Notes (Addendum)
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), Afib, protein C deficiency  No Known Allergies  Patient Measurements: Height: 5\' 11"  (180.3 cm) Weight: 77.9 kg (171 lb 11.8 oz) IBW/kg (Calculated) : 70.8 Heparin Dosing Weight: 74.2 kg  Vital Signs: Temp: 98.1 F (36.7 C) (09/18 1102) Temp Source: Bladder (09/18 0800) BP: 95/73 (09/18 1102) Pulse Rate: 125 (09/18 1102)  Labs: Recent Labs    06/22/20 1358 06/22/20 1508 06/23/20 0534 06/23/20 0541 06/23/20 1011 06/23/20 1011 06/23/20 1316 06/23/20 1316 06/23/20 1716 06/24/20 0119 06/24/20 0119 06/24/20 0415 06/24/20 0743 06/24/20 0919 06/24/20 1552 06/24/20 1555 06/25/20 0415 06/25/20 0805  HGB  --    < > 12.5   < >  --   --  15.6*   < >  --   --   --  12.2  --   --   --   --  12.2  --   HCT  --    < > 38.3   < >  --   --  46.0  --   --   --   --  37.2  --   --   --   --  37.4  --   PLT  --   --  254  --   --   --   --   --   --   --   --  294  --   --   --   --  260  --   APTT  --   --   --   --  35  --   --   --    < > 138*   < >  --   --  89* 58*  --  93*  --   LABPROT 42.6*  --  29.6*  --   --   --   --   --   --   --   --   --   --   --   --   --   --   --   INR 4.7*  --  2.9*  --   --   --   --   --   --   --   --   --   --   --   --   --   --   --   HEPARINUNFRC  --   --   --   --  1.86*  --   --   --   --  2.08*  --   --   --   --   --   --  1.02*  --   CREATININE  --    < > 1.32*   < > 1.45*   < >  --   --   --   --   --   --  1.54*  --   --  1.57*  --  1.72*  TROPONINIHS 125*  --   --   --   --   --   --   --   --   --   --   --   --   --   --   --   --   --    < > = values in this interval not displayed.    Estimated Creatinine Clearance: 40.8 mL/min (A) (by C-G formula based on SCr of 1.72 mg/dL (  H)).  Medications:  Infusions:  . sodium chloride    . feeding supplement (VITAL 1.5 CAL) 1,000 mL (06/25/20 0042)  . fentaNYL infusion  INTRAVENOUS 100 mcg/hr (06/25/20 1000)  . furosemide (LASIX) infusion 20 mg/hr (06/25/20 1000)  . heparin 1,000 Units/hr (06/25/20 1000)  . levETIRAcetam Stopped (06/25/20 0309)  . milrinone 0.125 mcg/kg/min (06/25/20 1106)  . norepinephrine (LEVOPHED) Adult infusion Stopped (06/25/20 0022)  . piperacillin-tazobactam (ZOSYN)  IV 12.5 mL/hr at 06/25/20 1000    Assessment: 67 yof on Apixaban PTA for recent hx DVT/PE in June 2021, also with hx Afib with the last dose 9/14 PM. Now on hold and continuing on heparin for bridging. Following aPTT while apixaban continues to influence heparin level.  APTT therapeutic at 93 this morning. CBC stable. No bleeding or issues with infusion per discussion with RN.  Goal of Therapy:  Heparin level 0.3-0.7 units/ml aPTT 66-102 seconds Monitor platelets by anticoagulation protocol: Yes   Plan:  Continue heparin drip at 1000 units/hr F/u confirmatory 6hr heparin level Monitor daily heparin level/aPTT/CBC, s/sx bleeding   Arturo Morton, PharmD, BCPS Please check AMION for all [place] contact numbers Clinical Pharmacist 06/25/2020 11:58 AM   ADDENDUM: Confirmatory aPTT this afternoon remains therapeutic at 70 seconds. Will continue heparin at current rate. No active bleed issues reported.   Arturo Morton, PharmD, BCPS Please check AMION for all [place] contact numbers Clinical Pharmacist 06/25/2020 3:04 PM

## 2020-06-25 NOTE — Progress Notes (Addendum)
Advanced Heart Failure Rounding Note   Subjective:    Milrinone and NE stopped. (unclear why milrinone stopped)  Now on IV amio due to AFL/atrial tach. No recurrent event.   Remains intubated sedated.   Only modest urine output. CVP 28-30 with prominent v waves  Cr up 1.5 -> 1.7  Objective:   Weight Range:  Vital Signs:   Temp:  [95.9 F (35.5 C)-98.8 F (37.1 C)] 97.5 F (36.4 C) (09/18 0900) Pulse Rate:  [72-111] 86 (09/18 0900) Resp:  [0-28] 4 (09/18 0900) BP: (77-116)/(55-83) 82/57 (09/18 0900) SpO2:  [93 %-100 %] 100 % (09/18 0900) Arterial Line BP: (94-151)/(59-90) 94/59 (09/18 0900) FiO2 (%):  [40 %] 40 % (09/18 0737) Weight:  [77.9 kg] 77.9 kg (09/18 0500) Last BM Date: 06/24/20  Weight change: Filed Weights   06/23/20 0500 06/24/20 0408 06/25/20 0500  Weight: 74.2 kg 77.9 kg 77.9 kg    Intake/Output:   Intake/Output Summary (Last 24 hours) at 06/25/2020 1012 Last data filed at 06/25/2020 0900 Gross per 24 hour  Intake 3210.24 ml  Output 823 ml  Net 2387.24 ml     Physical Exam: General: Intubated/sedated HEENT: normal Neck: supple. RIJ central line  CP to ear Carotids 2+ bilat; no bruits. No lymphadenopathy or thryomegaly appreciated. Cor: PMI nondisplaced. Regular rate & rhythm. 2/6 TR + RV lift  Lungs: coarse Abdomen: soft, nontender, + distended. No hepatosplenomegaly. No bruits or masses. Good bowel sounds. Extremities: no cyanosis, clubbing, rash, 3+ edema with RLE wound Neuro: intubated/sedated Telemetry: sinus 80-90s Personally reviewed  Labs: Basic Metabolic Panel: Recent Labs  Lab 06/22/20 1358 06/22/20 1508 06/23/20 0534 06/23/20 0541 06/23/20 1011 06/23/20 1011 06/23/20 1316 06/23/20 1716 06/24/20 0415 06/24/20 0743 06/24/20 1555 06/25/20 0805  NA  --    < > 133*   < > 134*  --  133*  --   --  136 137 137  K  --    < > 3.1*   < > 3.7  --  4.0  --   --  2.9* 3.8 3.9  CL  --    < > 99  --  99  --   --   --   --  98 100  101  CO2  --    < > 23  --  23  --   --   --   --  _0 GLUCOSE  --    < > 220*  --  232*  --   --   --   --  226* 167* 190*  BUN  --    < > 16  --  15  --   --   --   --  23* 25* 30*  CREATININE  --    < > 1.32*  --  1.45*  --   --   --   --  1.54* 1.57* 1.72*  CALCIUM  --    < > 7.2*   < > 7.4*   < >  --   --   --  7.4* 7.6* 7.9*  MG 1.5*  --   --   --  1.4*  --   --  1.5* 2.1  --  2.7*  --   PHOS 5.7*  --   --   --  3.4  --   --  3.2 3.7  --  3.1  --    < > = values in this interval  not displayed.    Liver Function Tests: Recent Labs  Lab 06/22/20 1139 06/23/20 0534 06/25/20 0805  AST 37 43* 24  ALT _0 ALKPHOS 112 91 103  BILITOT 2.9* 2.1* 1.5*  PROT 6.9 5.2* 6.3*  ALBUMIN 2.2* 1.7* 1.9*   No results for input(s): LIPASE, AMYLASE in the last 168 hours. Recent Labs  Lab 06/22/20 1147  AMMONIA 157*    CBC: Recent Labs  Lab 06/22/20 1139 06/22/20 1140 06/23/20 0534 06/23/20 0541 06/23/20 1316 06/24/20 0415 06/25/20 0415  WBC 9.3  --  9.4  --   --  17.5* 20.4*  NEUTROABS 7.4  --   --   --   --   --   --   HGB 13.2   < > 12.5 14.3 15.6* 12.2 12.2  HCT 46.3*   < > 38.3 42.0 46.0 37.2 37.4  MCV 111.0*  --  96.7  --   --  97.1 98.9  PLT 248  --  254  --   --  294 260   < > = values in this interval not displayed.    Cardiac Enzymes: No results for input(s): CKTOTAL, CKMB, CKMBINDEX, TROPONINI in the last 168 hours.  BNP: BNP (last 3 results) No results for input(s): BNP in the last 8760 hours.  ProBNP (last 3 results) No results for input(s): PROBNP in the last 8760 hours.    Other results:  Imaging: DG CHEST PORT 1 VIEW  Result Date: 06/23/2020 CLINICAL DATA:  Respiratory arrest, oxygen desaturation EXAM: PORTABLE CHEST 1 VIEW COMPARISON:  Portable exam 1325 hours compared to 06/22/2020 FINDINGS: Tip of endotracheal tube projects 4.1 cm above carina. Nasogastric tube extends into stomach. External pacing leads project over chest. RIGHT  jugular line with tip projecting over SVC. Enlargement of cardiac silhouette with vascular congestion. Diffuse BILATERAL pulmonary infiltrates unchanged favor pulmonary edema. Small probable BILATERAL pleural effusions. No pneumothorax. IMPRESSION: Persistent BILATERAL pulmonary edema and probable small BILATERAL pleural effusions. Electronically Signed   By: Lavonia Dana M.D.   On: 06/23/2020 13:40   Overnight EEG with video  Result Date: 06/24/2020 [name], MD     06/25/2020  8:24 AM Patient Name: JILDA KRESS MRN: [id number] Epilepsy Attending: [name] Referring Physician/Provider: Dr Kipp Brood Duration: 06/23/2020 1411 to 06/24/2020 1411  Patient history: 89o F with ams. EEG to evaluate for seizure.  Level of alertness: awake, asleep/sedated  AEDs during EEG study: None  Technical aspects: This EEG study was done with scalp electrodes positioned according to the 10-20 International system of electrode placement. Electrical activity was acquired at a sampling rate of 500Hz and reviewed with a high frequency filter of 70Hz and a low frequency filter of 1Hz. EEG data were recorded continuously and digitally stored.  Description: EEG showed continuous generalized 3 to 6 Hz theta-delta slowing admixed with 13-15Hz beta activity in frontocentral region. left anterior temporal, maximal F7 spikes were also noted. Hyperventilation and photic stimulation were not performed.   Event button was pressed on 06/23/2020 at 1747 for unclear reason. On video patient was noted to open her eyes. Concomitant eeg before, during and after the event didn't show an eeg change to suggest seizure.  ABNORMALITY - Spikes, left anterior temporal - Continuous slow, generalized  IMPRESSION: This study showed evidence of potential epileptogenicity from left anterior temporal region as well as  severe diffuse encephalopathy, nonspecific etiology but likely related to sedation. No seizures were seen throughout the  recording. Event button was pressed on 06/23/2020 at 1747 as described above without concomitant eeg change and was likely not epileptic.  [name]     Medications:     Scheduled Medications: . amiodarone  400 mg Per Tube TID  . chlorhexidine gluconate (MEDLINE KIT)  15 mL Mouth Rinse BID  . Chlorhexidine Gluconate Cloth  6 each Topical Q0600  . digoxin  0.125 mg Per Tube Daily  . feeding supplement (PROSource TF)  90 mL Per Tube BID  . [START ON 06/26/2020] influenza vac split quadrivalent PF  0.5 mL Intramuscular Tomorrow-1000  . insulin aspart  0-9 Units Subcutaneous Q4H  . insulin aspart  3 Units Subcutaneous Q4H  . lactulose  30 g Per Tube TID  . mouth rinse  15 mL Mouth Rinse 10 times per day  . [START ON 06/26/2020] methylPREDNISolone (SOLU-MEDROL) injection  80 mg Intravenous Daily  . pantoprazole sodium  40 mg Per Tube Daily  . potassium chloride  40 mEq Per Tube TID    Infusions: . sodium chloride    . chlorothiazide (DIURIL) IV    . feeding supplement (VITAL 1.5 CAL) 1,000 mL (06/25/20 0042)  . fentaNYL infusion INTRAVENOUS 100 mcg/hr (06/25/20 0900)  . furosemide 120 mg (06/25/20 0936)  . furosemide (LASIX) infusion 20 mg/hr (06/25/20 0900)  . heparin 1,000 Units/hr (06/25/20 0900)  . levETIRAcetam Stopped (06/25/20 0309)  . norepinephrine (LEVOPHED) Adult infusion Stopped (06/25/20 0022)  . piperacillin-tazobactam (ZOSYN)  IV 3.375 g (06/25/20 0934)    PRN Medications: Place/Maintain arterial line **AND** sodium chloride, docusate, fentaNYL, midazolam, midazolam, polyethylene glycol    Plan/Discussion:    1. 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 Will eventually need repeat RHC and VQ  - RH cath in 12/20 with mild PAH with normal RV function. - Echo 06/22/20 Echo LV small compressed from RV EF 45-50% RV  massively dilated and severely hypokinetic. Severe TR RVSP 76 Severe D-sign. Moderate pericardial effusion  - PFTs much better than I expected based on CT chest. DLCO markedly reduced - Continues w/ evidence of severe RHF and volume overload. (CVP 28-30 - checked personally) - Agree with increasing IV lasix. Can add metolazone - Co-ox 79% on milrinone 0.125. CCM has stopped milrinone this am. Given degree of RV dysfunction and PAH I would favor continuing milrinone to support RV and lower PA pressures while trying to diurese. Will defer to their management  - Can use iNO as needed though given degree of underlying lung disease have to watch closely for shunting should be ok while on vent as we can watch closely.  - Will need RHC prior to d/c (consider swan if not improving) - She remains critically ill. Inflammatory markers not convincing for CTD flare. She has severe RV failure and agree with inotropic support. May need to consider increasing milrinone or adding iNO soon. Continue diuresis. Will need RHC when improved.  2. Acute hypoxic respiratory failure - multifactorial including PAH, ILD, large R effusion, pulmonary edema and possible aspiration  - CCM managing - support RV as above.   3. 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 On lactulose - Suggest recheck  4. Profound hypoglycemia - suspect related to liver failure  5. CTD with h/o RA and SLE - As  above, I am concerned about possible CTD flare. Will treat with solumedrol 1 gram burst x 3 days for now - follow serology  6. DVT/PE - diagnosed 6/21 at [facility] - CT this admit. Negative for acute PE - continue heparin   7. AFL/atrial tach with runs NSVT  - improved on amio. CCM has switched to po today - Keep K> 4.0 Mg > 2.0  8. Hypokalemia - resolved  CRITICAL CARE Performed by: Glori Bickers  Total critical care time: 35 minutes  Critical care time was  exclusive of separately billable procedures and treating other patients.  Critical care was necessary to treat or prevent imminent or life-threatening deterioration.  Critical care was time spent personally by me (independent of midlevel providers or residents) on the following activities: development of treatment plan with patient and/or surrogate as well as nursing, discussions with consultants, evaluation of patient's response to treatment, examination of patient, obtaining history from patient or surrogate, ordering and performing treatments and interventions, ordering and review of laboratory studies, ordering and review of radiographic studies, pulse oximetry and re-evaluation of patient's condition.     Length of Stay: 3   Dacey Milberger [place] 06/25/2020, 10:12 AM  Advanced Heart Failure Team Pager [phone] (M-F; 7a - 4p)  Please contact [facility] for night-coverage after hours (4p -7a ) and weekends on [email or web address]

## 2020-06-26 DIAGNOSIS — G9340 Encephalopathy, unspecified: Secondary | ICD-10-CM | POA: Diagnosis not present

## 2020-06-26 DIAGNOSIS — J9601 Acute respiratory failure with hypoxia: Secondary | ICD-10-CM

## 2020-06-26 DIAGNOSIS — R570 Cardiogenic shock: Secondary | ICD-10-CM | POA: Diagnosis not present

## 2020-06-26 LAB — APTT
aPTT: 64 seconds — ABNORMAL HIGH (ref 24–36)
aPTT: 163 seconds (ref 24–36)
aPTT: 108 seconds — ABNORMAL HIGH (ref 24–36)

## 2020-06-26 LAB — HEPARIN LEVEL (UNFRACTIONATED)
Heparin Unfractionated: 0.53 IU/mL (ref 0.30–0.70)
Heparin Unfractionated: 0.6 IU/mL (ref 0.30–0.70)

## 2020-06-26 LAB — CBC
HCT: 34.4 % — ABNORMAL LOW (ref 36.0–46.0)
Hemoglobin: 10.8 g/dL — ABNORMAL LOW (ref 12.0–15.0)
MCH: 31.4 pg (ref 26.0–34.0)
MCHC: 31.4 g/dL (ref 30.0–36.0)
MCV: 100 fL (ref 80.0–100.0)
Platelets: 233 10*3/uL (ref 150–400)
RBC: 3.44 MIL/uL — ABNORMAL LOW (ref 3.87–5.11)
RDW: 19.4 % — ABNORMAL HIGH (ref 11.5–15.5)
WBC: 15.5 10*3/uL — ABNORMAL HIGH (ref 4.0–10.5)
nRBC: 0.3 % — ABNORMAL HIGH (ref 0.0–0.2)

## 2020-06-26 LAB — COMPREHENSIVE METABOLIC PANEL
ALT: 20 U/L (ref 0–44)
AST: 22 U/L (ref 15–41)
Albumin: 2 g/dL — ABNORMAL LOW (ref 3.5–5.0)
Alkaline Phosphatase: 123 U/L (ref 38–126)
Anion gap: 11 (ref 5–15)
BUN: 42 mg/dL — ABNORMAL HIGH (ref 6–20)
CO2: 23 mmol/L (ref 22–32)
Calcium: 8.5 mg/dL — ABNORMAL LOW (ref 8.9–10.3)
Chloride: 103 mmol/L (ref 98–111)
Creatinine, Ser: 2.09 mg/dL — ABNORMAL HIGH (ref 0.44–1.00)
GFR calc Af Amer: 30 mL/min — ABNORMAL LOW (ref >60)
GFR calc non Af Amer: 26 mL/min — ABNORMAL LOW (ref >60)
Glucose, Bld: 155 mg/dL — ABNORMAL HIGH (ref 70–99)
Potassium: 4.5 mmol/L (ref 3.5–5.1)
Sodium: 137 mmol/L (ref 135–145)
Total Bilirubin: 1.3 mg/dL — ABNORMAL HIGH (ref 0.3–1.2)
Total Protein: 6.6 g/dL (ref 6.5–8.1)

## 2020-06-26 LAB — COOXEMETRY PANEL
Carboxyhemoglobin: 1.7 % — ABNORMAL HIGH (ref 0.5–1.5)
Methemoglobin: 0.9 % (ref 0.0–1.5)
O2 Saturation: 74.6 %
Total hemoglobin: 11 g/dL — ABNORMAL LOW (ref 12.0–16.0)

## 2020-06-26 LAB — GLUCOSE, CAPILLARY
Glucose-Capillary: 162 mg/dL — ABNORMAL HIGH (ref 70–99)
Glucose-Capillary: 154 mg/dL — ABNORMAL HIGH (ref 70–99)
Glucose-Capillary: 187 mg/dL — ABNORMAL HIGH (ref 70–99)
Glucose-Capillary: 197 mg/dL — ABNORMAL HIGH (ref 70–99)
Glucose-Capillary: 169 mg/dL — ABNORMAL HIGH (ref 70–99)

## 2020-06-26 MED ORDER — METOLAZONE 5 MG PO TABS
5.0000 mg | ORAL_TABLET | Freq: Every day | ORAL | Status: DC
  Filled 2020-06-26: qty 1

## 2020-06-26 MED ORDER — FUROSEMIDE 10 MG/ML IJ SOLN
120.0000 mg | Freq: Once | INTRAVENOUS | Status: AC
  Administered 2020-06-26: 120 mg via INTRAVENOUS
  Filled 2020-06-26: qty 10

## 2020-06-26 MED ORDER — AMIODARONE HCL 200 MG PO TABS
400.0000 mg | ORAL_TABLET | Freq: Two times a day (BID) | ORAL | Status: DC
  Administered 2020-06-26 – 2020-07-04 (×16): 400 mg
  Filled 2020-06-26 (×18): qty 2

## 2020-06-26 MED ORDER — METOLAZONE 5 MG PO TABS
5.0000 mg | ORAL_TABLET | Freq: Every day | ORAL | Status: DC
  Administered 2020-06-26 – 2020-06-27 (×2): 5 mg
  Filled 2020-06-26 (×3): qty 1

## 2020-06-26 MED ORDER — DEXTROSE 5 % IV SOLN
500.0000 mg | Freq: Once | INTRAVENOUS | Status: AC
  Administered 2020-06-26: 500 mg via INTRAVENOUS
  Filled 2020-06-26: qty 500

## 2020-06-26 NOTE — Progress Notes (Signed)
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), Afib, protein C deficiency  No Known Allergies  Patient Measurements: Height: 5\' 11"  (180.3 cm) Weight: 77.9 kg (171 lb 11.8 oz) IBW/kg (Calculated) : 70.8 Heparin Dosing Weight: 74.2 kg  Vital Signs: Temp: 99 F (37.2 C) (09/19 0800) Temp Source: Bladder (09/19 0730) BP: 110/80 (09/19 0800) Pulse Rate: 100 (09/19 0800)  Labs: Recent Labs    06/23/20 1316 06/24/20 0119 06/24/20 0415 06/24/20 0415 06/24/20 0743 06/24/20 1555 06/25/20 0415 06/25/20 0805 06/25/20 1218 06/26/20 0500 06/26/20 0829  HGB   < >  --  12.2   < >  --   --  12.2  --   --  10.8*  --   HCT   < >  --  37.2  --   --   --  37.4  --   --  34.4*  --   PLT  --   --  294  --   --   --  260  --   --  233  --   APTT  --  138*  --    < >   < >  --  93*  --  70* 64*  --   HEPARINUNFRC  --  2.08*  --   --   --   --  1.02*  --   --  0.53  --   CREATININE  --   --   --   --    < > 1.57*  --  1.72*  --   --  2.09*   < > = values in this interval not displayed.    Estimated Creatinine Clearance: 33.6 mL/min (A) (by C-G formula based on SCr of 2.09 mg/dL (H)).  Medications:  Infusions:  . sodium chloride    . chlorothiazide (DIURIL) IV    . feeding supplement (VITAL 1.5 CAL) 45 mL/hr at 06/25/20 1300  . fentaNYL infusion INTRAVENOUS 75 mcg/hr (06/26/20 0800)  . furosemide 120 mg (06/26/20 0934)  . furosemide (LASIX) infusion 20 mg/hr (06/26/20 0800)  . heparin 1,000 Units/hr (06/26/20 0800)  . levETIRAcetam Stopped (06/26/20 0302)  . milrinone 0.125 mcg/kg/min (06/26/20 0800)  . norepinephrine (LEVOPHED) Adult infusion 2 mcg/min (06/25/20 2144)  . piperacillin-tazobactam (ZOSYN)  IV 3.375 g (06/26/20 0925)    Assessment: 19 yof on Apixaban PTA for recent hx DVT/PE in June 2021, also with hx Afib with the last dose 9/14 PM. Now on hold and continuing on heparin for bridging. Following  aPTT while apixaban continues to influence heparin level.  APTT slightly subtherapeutic at 64 this morning. Heparin level 0.53, improved but not yet correlating with aPTT. CBC stable. No bleeding or issues with infusion per discussion with RN.  Goal of Therapy:  Heparin level 0.3-0.7 units/ml aPTT 66-102 seconds Monitor platelets by anticoagulation protocol: Yes   Plan:  Increase heparin drip to 1100 units/hr F/u 6hr heparin level Monitor daily heparin level/aPTT/CBC, s/sx bleeding   Arturo Morton, PharmD, BCPS Please check AMION for all [place] contact numbers Clinical Pharmacist 06/26/2020 10:20 AM

## 2020-06-26 NOTE — Progress Notes (Signed)
NAME:  Carolyn Proctor, MRN:  [id number], DOB:  1961-05-29, LOS: 4 ADMISSION DATE:  06/22/2020, CONSULTATION DATE:  06/22/2020 REFERRING MD:  Dr. Tyrone Nine, CHIEF COMPLAINT:  AMS/ refractory hypoglycemia  Brief History   42 yoF at home who complained to family she felt like she was having a stroke, then developed left facial droop and went unresponsive.  Required BVM by EMS and noted to be hypoglycemic and hypotensive.  Intubated in ER for airway protection.  CTH negative.  Refractory hypoglycemia despite multiple D50 amps now on drip.  PCCM called to admit.   History of present illness   HPI obtained from medical chart review and daughter, Carolyn Proctor at beside, as patient is currently intubated and sedated on mechanical ventilation.  59 year old female with prior history of PAF, DVT, and PE on Eliquis, RA,  hypercoagulable state (increased protein S antigen, decreased protein C activity, and decreased Antithrombin III), HFpEF, CKD stage III, centrilobular emphysema and pulmonary fibrosis on daily prednisone- never smoker, chronic venous insufficiency w/ chronic foot ulcers, and diverticulitis.   Daughter reports patient has not felt well since her hospitalization in June at [facility] where she was diagnosed with PE/ DVT and placed on Eliquis in which daughter states she is compliant with.  Since, patient also has been struggling with weight loss, early satiety, muscle weakness, and ongoing lower leg edema/ wounds.  Seen by GI and put on miralax per daughter for constipation.  Daughter also reports her prednisone has been recently tapered.    Patient was at home this morning around 1020, when she reported to her family that she felt like she was having a stroke. Family noticed a left facial droop and then patient proceeded to become unresponsive.  On EMS arrival, patient required continuous BVM. CBG noted 49, afebrile and was hypotensive requiring fluid bolus.  On arrival to ER, she remained altered  and required intubation for airway protection. 2L NS bolus given for hypotension.  She remained hypoglycemic despite multiple D50 requiring D10 gtt.  A right IJ central line was placed in ER. Labs noted for normal WBC/ Hgb, Na 132, CO2 10, sCr 1.38, AG 19, albumin 2.2, normal AST/ ALT, t. Bili 2.9, trop hs 34, LA 10.8, ETOH neg, ammonia 157. CT head without acute process.  CXR showing satisfactory position of ETT, layering right pleural effusion, diffuse interstitial thickening c/w hx of fibrosis.  ABG post intubation 6.954/ 43.4/ 31.3/ 9.6.  Patient remained normotensive but temp 95 requiring bairhugger.  PCCM called for admit.   Past Medical History  PAF, DVT, PE on Eliquis HFpEF CKD stage III RA Hypercoagulable state (increased protein S antigen, decreased protein C activity, and decreased Antithrombin III) Centrilobular emphysema and pulmonary fibrosis- non smoker Chronic venous insufficiency w/ chronic foot ulcers  Diverticulitis   Significant Hospital Events   9/15 admitted/ intubated  Consults:   Procedures:  9/15 ETT >> 9/15 R IJ TL CVC >> 9/15 OGT/ foley  Significant Diagnostic Tests:  9/15 CTH >> negative  9/15 CXR >> ETT 5.3 above carina, layering right pleural effusion, diffuse interstitial thickening c/w hx of fibrosis  9/15 CTA chest/ abd/ pelvis >> significant dilatation of RA and RV consistent with R heart failure; moderate R and small L pleural effusions with associated atelectasis of both lower lobes; underlying chronic cystic and fibrotic lung disease; severe anasarca; nodular contour of liver suggestive of possible cirrhosis 9/15 CTA head and neck >> no significant carotid or vertebral artery stenosis in neck,  no LVO, right jugular central venous catheter with probable surrounding hematoma  9/16 CXR >> stable mild b/l pulmonary edema with small pleural effusions. No PTX noted  Micro Data:  9/15 BCx2 >> 9/15 SARS2 >> neg 9/15 UC >> E coli  Antimicrobials:    9/15 vanc >> 9/17 9/15 zosyn >>  Interim history/subjective:  Mental status improved compared to yesterday. Able to follow simple commands such as squeezing fingers and wiggling toes.  Profoundly volume overloaded and low UOP despite lasix drip. Will plan for more aggressive diuresis today.  Will plan for MRI today as per neuro.  Objective   Blood pressure 130/85, pulse 99, temperature 98.1 F (36.7 C), resp. rate (!) 28, height '5\' 11"'  (1.803 m), weight 77.9 kg, last menstrual period 02/19/2013, SpO2 98 %. CVP:  [19 mmHg-27 mmHg] 27 mmHg  Vent Mode: PRVC FiO2 (%):  [40 %] 40 % Set Rate:  [28 bmp] 28 bmp Vt Set:  [500 mL] 500 mL PEEP:  [5 cmH20] 5 cmH20 Plateau Pressure:  [22 cmH20-25 cmH20] 25 cmH20   Intake/Output Summary (Last 24 hours) at 06/26/2020 1257 Last data filed at 06/26/2020 1200 Gross per 24 hour  Intake 2480.7 ml  Output 3375 ml  Net -894.3 ml   Filed Weights   06/23/20 0500 06/24/20 0408 06/25/20 0500  Weight: 74.2 kg 77.9 kg 77.9 kg    Examination: General: ill-appearing adult female, intubated and mechanically ventilated, in NAD. HENT: mucous membranes pink but slightly dry, ETT and OGT in place.  Eyes: Pupils sluggishly reactive to light. CV: regular rate and irregular rhythm. 2/6 systolic murmur/TR noted. Pulm: mechanically ventilated breath sounds bilaterally, bibasilar crackles noted. GI: slightly firm and slightly distended abdomen. +BS. Neuro: Opens eyes to voice and is able to appropriately blink eyes to yes or no questions. Weak hand grip bilaterally. Able to wiggle toes bilaterally.  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 RHC 2020 PAmean 35, PCWP 12, PVR 4.5 WU, preserved CO and CI. TTE this admission  with profound RV failure with volume/pressure overload. She has signs of L heart failure with bilateral pleural effusions and pulmonary edema. UOP is poor, marked volume overload on exam. --Milrinone for inotrope and RV afterload reduction, fortunately not shunting much through chronic lung disease with systemic therapy, SvO2 encouraging --Continue lasix 20 mg/hr, re-bolus IV lasix 120 mg and Diuril  --Consider inhaled NO if UOP not improving  Acute hypoxemic respiratory failure: ILD, volume overload. --Continue ventilator --s/p pulse steroids, decrease to solumedrol 80 mg iv daily --Diurese  E. Coli UTI: --Zosyn, plan 5 days  AKI: due to venous congestion and low CO. CO hopefully improved now feels warm. Still with volume overload.  --UOP picking up with aggressive lasix drip, lasix bolus, diuril   Best practice:  Diet: NPO, TF Pain/Anxiety/Delirium protocol (if indicated): fentanyl/versed VAP protocol (if indicated): yes DVT prophylaxis: Heparin per pharmacy GI prophylaxis: PPI Glucose control: insulin Mobility: bedrest Code Status: full  Family Communication: Daughter updated at bedside Disposition: ICU  Labs   CBC: Recent Labs  Lab 06/22/20 1139 06/22/20 1140 06/23/20 0534 06/23/20 0534 06/23/20 0541 06/23/20 1316 06/24/20 0415 06/25/20 0415 06/26/20 0500  WBC 9.3  --  9.4  --   --   --  17.5* 20.4* 15.5*  NEUTROABS 7.4  --   --   --   --   --   --   --   --   HGB 13.2   < > 12.5   < > 14.3 15.6* 12.2 12.2 10.8*  HCT 46.3*   < > 38.3   < > 42.0 46.0 37.2 37.4 34.4*  MCV 111.0*  --  96.7  --   --   --  97.1 98.9 100.0  PLT 248  --  254  --   --   --  294 260 233   < > = values in this interval not displayed.    Basic Metabolic Panel: Recent Labs  Lab 06/22/20 1358 06/22/20 1508 06/23/20 1011 06/23/20 1011 06/23/20 1316 06/23/20 1716 06/24/20 0415 06/24/20 0743 06/24/20 1555 06/25/20 0805 06/26/20 0829  NA  --    < > 134*   < > 133*  --   --  136  137 137 137  K  --    < > 3.7   < > 4.0  --   --  2.9* 3.8 3.9 4.5  CL  --    < > 99  --   --   --   --  98 100 101 103  CO2  --    < > 23  --   --   --   --  '23 23 22 23  ' GLUCOSE  --    < > 232*  --   --   --   --  226* 167* 190* 155*  BUN  --    < > 15  --   --   --   --  23* 25* 30* 42*  CREATININE  --    < > 1.45*  --   --   --   --  1.54* 1.57* 1.72* 2.09*  CALCIUM  --    < > 7.4*  --   --   --   --  7.4* 7.6* 7.9* 8.5*  MG 1.5*  --  1.4*  --   --  1.5* 2.1  --  2.7*  --   --   PHOS 5.7*  --  3.4  --   --  3.2 3.7  --  3.1  --   --    < > = values in this interval not displayed.   GFR: Estimated Creatinine Clearance: 33.6 mL/min (A) (by C-G formula based on SCr of 2.09 mg/dL (H)). Recent Labs  Lab 06/22/20 1139 06/22/20 1429 06/23/20 0534 06/23/20 1352 06/23/20 2214 06/24/20 0415 06/24/20 1552 06/25/20 0415 06/26/20 0500  PROCALCITON  --   --  11.22  --   --   --   --   --   --   WBC   < >  --  9.4  --   --  17.5*  --  20.4* 15.5*  LATICACIDVEN  --  5.8*  --  7.9* 4.9*  --  3.1*  --   --    < > = values in this interval not displayed.    Liver Function Tests: Recent Labs  Lab 06/22/20 1139 06/23/20 0534 06/25/20 0805 06/26/20 0829  AST 37 43* 24 22  ALT '15 20 20 20  ' ALKPHOS 112 91 103 123  BILITOT 2.9* 2.1* 1.5* 1.3*  PROT 6.9 5.2* 6.3* 6.6  ALBUMIN 2.2* 1.7* 1.9* 2.0*   No results for input(s): LIPASE, AMYLASE in the last  168 hours. Recent Labs  Lab 06/22/20 1147  AMMONIA 157*    ABG    Component Value Date/Time   PHART 7.271 (L) 06/23/2020 1316   PCO2ART 33.0 06/23/2020 1316   PO2ART 357 (H) 06/23/2020 1316   HCO3 15.2 (L) 06/23/2020 1316   TCO2 16 (L) 06/23/2020 1316   ACIDBASEDEF 11.0 (H) 06/23/2020 1316   O2SAT 74.6 06/26/2020 0520     Coagulation Profile: Recent Labs  Lab 06/22/20 1358 06/23/20 0534  INR 4.7* 2.9*    Cardiac Enzymes: No results for input(s): CKTOTAL, CKMB, CKMBINDEX, TROPONINI in the last 168 hours.  HbA1C: Hgb  A1c MFr Bld  Date/Time Value Ref Range Status  06/23/2020 05:34 AM 5.5 4.8 - 5.6 % Final    Comment:    (NOTE) Pre diabetes:          5.7%-6.4%  Diabetes:              >6.4%  Glycemic control for   <7.0% adults with diabetes   07/17/2007 02:46 PM   Final   5.3 (NOTE)   The ADA recommends the following therapeutic goals for glycemic   control related to Hgb A1C measurement:   Goal of Therapy:   < 7.0% Hgb A1C   Action Suggested:  > 8.0% Hgb A1C   Ref:  Diabetes Care, 22, Suppl. 1, 1999    CBG: Recent Labs  Lab 06/25/20 1936 06/25/20 2325 06/26/20 0340 06/26/20 0834 06/26/20 1148  GLUCAP 127* 172* 162* 154* 187*    Review of Systems:   Unable to perform  Past Medical History  She,  has a past medical history of Acute myopericarditis (02/24/2013), Atrial fibrillation ([place]), Atrial fibrillation with RVR ([place]) (02/22/2013), Chronic anticoagulation, Xarelto (04/02/2013), Chronic diastolic CHF (congestive heart failure) ([place]) (06/26/2019), Diverticulitis, Elevated troponin- secondary to Myopericarditis (02/25/2013), Emphysema  (07/07/2019), Hypertension, Lupus ([place]) (09/14/2011), 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   Surgical History    Past Surgical History:  Procedure Laterality Date  . BONE MARROW BIOPSY  2010  . CARDIAC CATHETERIZATION    . LEFT HEART CATHETERIZATION WITH CORONARY ANGIOGRAM N/A 02/24/2013   Procedure: LEFT HEART CATHETERIZATION WITH CORONARY ANGIOGRAM;  Surgeon: Leonie Man, MD;  Location: [facility] CATH LAB;  Service: Cardiovascular;  Laterality: N/A;  . RIGHT/LEFT HEART CATH AND  CORONARY ANGIOGRAPHY N/A 09/09/2019   Procedure: RIGHT/LEFT HEART CATH AND CORONARY ANGIOGRAPHY;  Surgeon: Jettie Booze, MD;  Location: [facility] CV LAB;  Service: Cardiovascular;  Laterality: N/A;     Social History   reports that she has never smoked. She has never used smokeless tobacco. She reports that she does not drink alcohol and does not use drugs.   Family History   Her family history includes Hypertension in her maternal grandmother; Liver disease in her mother; Lupus in her brother and mother.   Allergies No Known Allergies   Home Medications  Prior to Admission medications   Medication Sig Start Date End Date Taking? Authorizing Provider  albuterol (VENTOLIN HFA) 108 (90 Base) MCG/ACT inhaler Inhale 2 puffs into the lungs every 6 (six) hours as needed for wheezing or shortness of breath. 06/26/19   [place] Donnamarie Poag, DO  Calcium Carbonate-Vit D-Min (CALCIUM  1200 PO) Take 1 capsule by mouth daily.    [provider]  Cholecalciferol (VITAMIN D) 50 MCG (2000 UT) tablet Take 2,000 Units by mouth daily.     [provider]  Cyanocobalamin (VITAMIN B-12) 1000 MCG SUBL Place 1,000 mcg under the tongue.    [provider]  furosemide (LASIX) 20 MG tablet Take 1 tablet (20 mg total) by mouth 3 (three) times a week. 09/23/19   Jettie Booze, MD  pantoprazole (PROTONIX) 40 MG tablet Take 1 tablet (40 mg total) by mouth daily. 09/28/19 12/27/19  Margaretha Seeds, MD  predniSONE ([place]) 5 MG tablet TAKE 2 TABLETS BY MOUTH EVERY DAY 01/18/20   Margaretha Seeds, MD     Critical care time: 34 mins     Quamir Willemsen, Bonna Gains, MD 06/26/2020, 12:57 PM Pager: [phone]

## 2020-06-26 NOTE — Progress Notes (Signed)
MRI brain reviewed and is negative.  At this point I am unclear on why she has a focal area of epileptogenicity, but in any case she will need to stay on antiepileptic therapy indefinitely.  Continue Keppra 500 twice daily, neurology will be available on an as-needed basis moving forward please call with further questions or concerns.  Roland Rack, MD [facility] [phone]  If 7pm- 7am, please page neurology on call as listed in [place].

## 2020-06-26 NOTE — Progress Notes (Signed)
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), Afib, protein C deficiency  No Known Allergies  Patient Measurements: Height: 5\' 11"  (180.3 cm) Weight: 77.9 kg (171 lb 11.8 oz) IBW/kg (Calculated) : 70.8 Heparin Dosing Weight: 74.2 kg  Vital Signs: Temp: 97.5 F (36.4 C) (09/19 1900) Temp Source: Bladder (09/19 1600) BP: 103/65 (09/19 1900) Pulse Rate: 71 (09/19 1900)  Labs: Recent Labs    06/24/20 0119 06/24/20 0415 06/24/20 0743 06/24/20 1555 06/25/20 0415 06/25/20 0805 06/25/20 1218 06/26/20 0500 06/26/20 0829 06/26/20 1710 06/26/20 1902  HGB   < > 12.2  --   --  12.2  --   --  10.8*  --   --   --   HCT  --  37.2  --   --  37.4  --   --  34.4*  --   --   --   PLT  --  294  --   --  260  --   --  233  --   --   --   APTT   < >  --    < >  --  93*  --    < > 64*  --  163* 108*  HEPARINUNFRC   < >  --   --   --  1.02*  --   --  0.53  --   --  0.60  CREATININE  --   --    < > 1.57*  --  1.72*  --   --  2.09*  --   --    < > = values in this interval not displayed.    Estimated Creatinine Clearance: 33.6 mL/min (A) (by C-G formula based on SCr of 2.09 mg/dL (H)).  Medications:  Infusions:  . sodium chloride    . feeding supplement (VITAL 1.5 CAL) 45 mL/hr at 06/25/20 1300  . fentaNYL infusion INTRAVENOUS 150 mcg/hr (06/26/20 1900)  . furosemide (LASIX) infusion 20 mg/hr (06/26/20 1900)  . heparin 1,100 Units/hr (06/26/20 1900)  . levETIRAcetam Stopped (06/26/20 1658)  . milrinone 0.125 mcg/kg/min (06/26/20 1900)  . norepinephrine (LEVOPHED) Adult infusion 2 mcg/min (06/25/20 2144)  . piperacillin-tazobactam (ZOSYN)  IV 12.5 mL/hr at 06/26/20 1900    Assessment: 67 yof on Apixaban PTA for recent hx DVT/PE in June 2021, also with hx Afib with the last dose 9/14 PM. Now on hold and continuing on heparin for bridging. Following aPTT while apixaban influenced heparin level.  APTT 108 sec with  heparin level 0.6 both correlating now and will dose heparin drip on Hl moving forward.  Heparin drip 1100 uts/hr  CBC stable. No bleeding or issues with infusion per discussion with RN.  Goal of Therapy:  Heparin level 0.3-0.7 units/ml aPTT 66-102 seconds Monitor platelets by anticoagulation protocol: Yes   Plan:  Continue  heparin drip 1100 units/hr Monitor daily heparin level/aPTT/CBC, s/sx bleeding   Bonnita Nasuti Pharm.D. CPP, BCPS Clinical Pharmacist [phone] 06/26/2020 7:55 PM

## 2020-06-26 NOTE — Progress Notes (Signed)
Advanced Heart Failure Rounding Note   Subjective:    On milrinone 0.125 and lasix 20/min. No weight recorded. I/Os even. Co-ox 74%  Remains intubated/sedated. CVP 25  Cr up 1.5 -> 1.7 -> 2.1  Objective:   Weight Range:  Vital Signs:   Temp:  [96.1 F (35.6 C)-99.5 F (37.5 C)] 98.1 F (36.7 C) (09/19 1300) Pulse Rate:  [70-111] 81 (09/19 1300) Resp:  [25-28] 28 (09/19 1300) BP: (83-133)/(57-92) 110/73 (09/19 1300) SpO2:  [95 %-100 %] 100 % (09/19 1300) Arterial Line BP: (84-158)/(56-93) 123/70 (09/19 1300) FiO2 (%):  [40 %] 40 % (09/19 0730) Last BM Date: 06/26/20  Weight change: Filed Weights   06/23/20 0500 06/24/20 0408 06/25/20 0500  Weight: 74.2 kg 77.9 kg 77.9 kg    Intake/Output:   Intake/Output Summary (Last 24 hours) at 06/26/2020 1341 Last data filed at 06/26/2020 1333 Gross per 24 hour  Intake 2264.93 ml  Output 3700 ml  Net -1435.07 ml     Physical Exam: General:  Intubated. Awake and follows commands HEENT: normal +ETT Neck: supple. RIJ TLC Carotids 2+ bilat; no bruits. No lymphadenopathy or thryomegaly appreciated. Cor: PMI nondisplaced. Regular rate & rhythm. 2/6 TR + RV lift Lungs: clear Abdomen: soft, nontender, + distended. No hepatosplenomegaly. No bruits or masses. Good bowel sounds. Extremities: no cyanosis, clubbing, rash, 3-4+ edema + weeping sores Neuro: awake on vent     Telemetry: sinus 80-90s Personally reviewed   Labs: Basic Metabolic Panel: Recent Labs  Lab 06/22/20 1358 06/22/20 1508 06/23/20 1011 06/23/20 1011 06/23/20 1316 06/23/20 1716 06/24/20 0415 06/24/20 0743 06/24/20 0743 06/24/20 1555 06/25/20 0805 06/26/20 0829  NA  --    < > 134*   < > 133*  --   --  136  --  137 137 137  K  --    < > 3.7   < > 4.0  --   --  2.9*  --  3.8 3.9 4.5  CL  --    < > 99  --   --   --   --  98  --  100 101 103  CO2  --    < > 23  --   --   --   --  23  --  '23 22 23  ' GLUCOSE  --    < > 232*  --   --   --   --  226*  --   167* 190* 155*  BUN  --    < > 15  --   --   --   --  23*  --  25* 30* 42*  CREATININE  --    < > 1.45*  --   --   --   --  1.54*  --  1.57* 1.72* 2.09*  CALCIUM  --    < > 7.4*   < >  --   --   --  7.4*   < > 7.6* 7.9* 8.5*  MG 1.5*  --  1.4*  --   --  1.5* 2.1  --   --  2.7*  --   --   PHOS 5.7*  --  3.4  --   --  3.2 3.7  --   --  3.1  --   --    < > = values in this interval not displayed.    Liver Function Tests: Recent Labs  Lab 06/22/20 1139 06/23/20 0534 06/25/20 0805  06/26/20 0829  AST 37 43* 24 22  ALT '15 20 20 20  ' ALKPHOS 112 91 103 123  BILITOT 2.9* 2.1* 1.5* 1.3*  PROT 6.9 5.2* 6.3* 6.6  ALBUMIN 2.2* 1.7* 1.9* 2.0*   No results for input(s): LIPASE, AMYLASE in the last 168 hours. Recent Labs  Lab 06/22/20 1147  AMMONIA 157*    CBC: Recent Labs  Lab 06/22/20 1139 06/22/20 1140 06/23/20 0534 06/23/20 0534 06/23/20 0541 06/23/20 1316 06/24/20 0415 06/25/20 0415 06/26/20 0500  WBC 9.3  --  9.4  --   --   --  17.5* 20.4* 15.5*  NEUTROABS 7.4  --   --   --   --   --   --   --   --   HGB 13.2   < > 12.5   < > 14.3 15.6* 12.2 12.2 10.8*  HCT 46.3*   < > 38.3   < > 42.0 46.0 37.2 37.4 34.4*  MCV 111.0*  --  96.7  --   --   --  97.1 98.9 100.0  PLT 248  --  254  --   --   --  294 260 233   < > = values in this interval not displayed.    Cardiac Enzymes: No results for input(s): CKTOTAL, CKMB, CKMBINDEX, TROPONINI in the last 168 hours.  BNP: BNP (last 3 results) No results for input(s): BNP in the last 8760 hours.  ProBNP (last 3 results) No results for input(s): PROBNP in the last 8760 hours.    Other results:  Imaging: MR BRAIN WO CONTRAST  Result Date: 06/25/2020 CLINICAL DATA:  59 year old female with recent altered mental status. Seizure. EXAM: MRI HEAD WITHOUT CONTRAST TECHNIQUE: Multiplanar, multiecho pulse sequences of the brain and surrounding structures were obtained without intravenous contrast. COMPARISON:  CT head, CTA head and neck  06/22/2020. Brain MRI 07/17/2007. FINDINGS: Brain: Generalized cerebral volume loss since 2008. No restricted diffusion to suggest acute infarction. No midline shift, mass effect, evidence of mass lesion, ventriculomegaly, extra-axial collection or acute intracranial hemorrhage. Cervicomedullary junction and pituitary are within normal limits. On thin slice coronal images mesial temporal lobe structures appear fairly symmetric and within normal limits. And elsewhere gray and white matter signal is within normal limits for age. No cortical encephalomalacia. No definite chronic cerebral blood products. The deep gray nuclei, brainstem and cerebellum are unremarkable. Vascular: Major intracranial vascular flow voids are stable since 2008. Skull and upper cervical spine: Negative visible cervical spine. Visualized bone marrow signal is within normal limits. Sinuses/Orbits: Disconjugate gaze but otherwise negative. Other: Intubated. Fluid in the pharynx. Mild right greater than left mastoid air cell fluid. Visible scalp and face appear negative. IMPRESSION: 1. No acute intracranial abnormality. 2. Generalized cerebral volume loss since 2008, otherwise unremarkable for age noncontrast MRI appearance of the brain. Electronically Signed   By: Genevie Ann M.D.   On: 06/25/2020 15:11     Medications:     Scheduled Medications: . amiodarone  400 mg Per Tube TID  . chlorhexidine gluconate (MEDLINE KIT)  15 mL Mouth Rinse BID  . Chlorhexidine Gluconate Cloth  6 each Topical Q0600  . digoxin  0.125 mg Per Tube Daily  . feeding supplement (PROSource TF)  90 mL Per Tube BID  . influenza vac split quadrivalent PF  0.5 mL Intramuscular Tomorrow-1000  . insulin aspart  0-9 Units Subcutaneous Q4H  . insulin aspart  3 Units Subcutaneous Q4H  . lactulose  30 g Per  Tube TID  . mouth rinse  15 mL Mouth Rinse 10 times per day  . methylPREDNISolone (SOLU-MEDROL) injection  80 mg Intravenous Daily  . pantoprazole sodium  40 mg  Per Tube Daily    Infusions: . sodium chloride    . feeding supplement (VITAL 1.5 CAL) 45 mL/hr at 06/25/20 1300  . fentaNYL infusion INTRAVENOUS 150 mcg/hr (06/26/20 1300)  . furosemide (LASIX) infusion 20 mg/hr (06/26/20 1300)  . heparin 1,100 Units/hr (06/26/20 1300)  . levETIRAcetam Stopped (06/26/20 0302)  . milrinone 0.125 mcg/kg/min (06/26/20 1300)  . norepinephrine (LEVOPHED) Adult infusion 2 mcg/min (06/25/20 2144)  . piperacillin-tazobactam (ZOSYN)  IV 12.5 mL/hr at 06/26/20 1300    PRN Medications: Place/Maintain arterial line **AND** sodium chloride, docusate, fentaNYL, midazolam, midazolam, polyethylene glycol    Plan/Discussion:    1. 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 Will eventually need repeat RHC and VQ  - RH cath in 12/20 with mild PAH with normal RV function. - Echo 06/22/20 Echo LV small compressed from RV EF 45-50% RV massively dilated and severely hypokinetic. Severe TR RVSP 76 Severe D-sign. Moderate pericardial effusion  - PFTs much better than I expected based on CT chest. DLCO markedly reduced - Continues w/ evidence of severe RHF and volume overload. (CVP 28-30 - checked personally) - Co-ox 74% on milrinone 0.125. - Urine output picked up with increase lasix gtt to 20 but still severely overloaded. Add metolazone 5 daily> place TED hose - Renal function worsening. Have to watch closely - Can use iNO as needed though given degree of underlying lung disease have to watch closely for shunting should be ok while on vent as we can watch closely.  - Will need RHC prior to d/c (consider [place]an if not improving) - She remains critically ill. Inflammatory markers not convincing for CTD flare. She has severe RV failure and agree with inotropic support. May need to consider increasing milrinone or adding iNO soon.  Continue diuresis. Will need RHC when improved.  2. Acute hypoxic respiratory failure - multifactorial including PAH, ILD, large R effusion, pulmonary edema and possible aspiration  - CCM managing - support RV as above.   3. 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 On lactulose - Suggest recheck  4. Profound hypoglycemia - suspect related to liver failure  5. CTD with h/o RA and SLE - As above, I am concerned about possible CTD flare. Will treat with solumedrol 1 gram burst x 3 days for now - follow serology  6. DVT/PE - diagnosed 6/21 at [place] - CT this admit. Negative for acute PE - continue heparin   7. AFL/atrial tach with runs NSVT  - improved on amio. - Keep K> 4.0 Mg > 2.0  8. Hypokalemia - resolved  CRITICAL CARE Performed by: Glori Bickers  Total critical care time: 35 minutes  Critical care time was exclusive of separately billable procedures and treating other patients.  Critical care was necessary to treat or prevent imminent or life-threatening deterioration.  Critical care was time spent personally by me (independent of midlevel providers or residents) on the following activities: development of treatment plan with patient and/or surrogate as well as nursing, discussions with consultants, evaluation of patient's response to treatment, examination of patient, obtaining history from patient or surrogate,  ordering and performing treatments and interventions, ordering and review of laboratory studies, ordering and review of radiographic studies, pulse oximetry and re-evaluation of patient's condition.     Length of Stay: [place] 06/26/2020, 1:41 PM  Advanced Heart Failure Team Pager [phone] (M-F; 7a - 4p)  Please contact [facility] for night-coverage after hours (4p -7a ) and weekends on [email or web address]

## 2020-06-27 ENCOUNTER — Inpatient Hospital Stay (HOSPITAL_COMMUNITY): Payer: BC Managed Care – PPO

## 2020-06-27 DIAGNOSIS — N390 Urinary tract infection, site not specified: Secondary | ICD-10-CM | POA: Diagnosis not present

## 2020-06-27 DIAGNOSIS — G9340 Encephalopathy, unspecified: Secondary | ICD-10-CM | POA: Diagnosis not present

## 2020-06-27 LAB — CULTURE, BLOOD (ROUTINE X 2)
Culture: NO GROWTH
Special Requests: ADEQUATE

## 2020-06-27 LAB — CBC
HCT: 35.2 % — ABNORMAL LOW (ref 36.0–46.0)
Hemoglobin: 11.5 g/dL — ABNORMAL LOW (ref 12.0–15.0)
MCH: 32.4 pg (ref 26.0–34.0)
MCHC: 32.7 g/dL (ref 30.0–36.0)
MCV: 99.2 fL (ref 80.0–100.0)
Platelets: 240 10*3/uL (ref 150–400)
RBC: 3.55 MIL/uL — ABNORMAL LOW (ref 3.87–5.11)
RDW: 19.2 % — ABNORMAL HIGH (ref 11.5–15.5)
WBC: 17.6 10*3/uL — ABNORMAL HIGH (ref 4.0–10.5)
nRBC: 0.2 % (ref 0.0–0.2)

## 2020-06-27 LAB — COMPREHENSIVE METABOLIC PANEL
ALT: 19 U/L (ref 0–44)
AST: 24 U/L (ref 15–41)
Albumin: 1.8 g/dL — ABNORMAL LOW (ref 3.5–5.0)
Alkaline Phosphatase: 116 U/L (ref 38–126)
Anion gap: 13 (ref 5–15)
BUN: 55 mg/dL — ABNORMAL HIGH (ref 6–20)
CO2: 23 mmol/L (ref 22–32)
Calcium: 8.6 mg/dL — ABNORMAL LOW (ref 8.9–10.3)
Chloride: 102 mmol/L (ref 98–111)
Creatinine, Ser: 2.21 mg/dL — ABNORMAL HIGH (ref 0.44–1.00)
GFR calc Af Amer: 28 mL/min — ABNORMAL LOW (ref >60)
GFR calc non Af Amer: 24 mL/min — ABNORMAL LOW (ref >60)
Glucose, Bld: 195 mg/dL — ABNORMAL HIGH (ref 70–99)
Potassium: 3.2 mmol/L — ABNORMAL LOW (ref 3.5–5.1)
Sodium: 138 mmol/L (ref 135–145)
Total Bilirubin: 1.4 mg/dL — ABNORMAL HIGH (ref 0.3–1.2)
Total Protein: 6.4 g/dL — ABNORMAL LOW (ref 6.5–8.1)

## 2020-06-27 LAB — COOXEMETRY PANEL
Carboxyhemoglobin: 2 % — ABNORMAL HIGH (ref 0.5–1.5)
Carboxyhemoglobin: 1.5 % (ref 0.5–1.5)
Methemoglobin: 0.9 % (ref 0.0–1.5)
Methemoglobin: 1 % (ref 0.0–1.5)
O2 Saturation: 99.6 %
O2 Saturation: 67.6 %
Total hemoglobin: 11.8 g/dL — ABNORMAL LOW (ref 12.0–16.0)
Total hemoglobin: 11.6 g/dL — ABNORMAL LOW (ref 12.0–16.0)

## 2020-06-27 LAB — GLUCOSE, CAPILLARY
Glucose-Capillary: 148 mg/dL — ABNORMAL HIGH (ref 70–99)
Glucose-Capillary: 153 mg/dL — ABNORMAL HIGH (ref 70–99)
Glucose-Capillary: 154 mg/dL — ABNORMAL HIGH (ref 70–99)
Glucose-Capillary: 161 mg/dL — ABNORMAL HIGH (ref 70–99)
Glucose-Capillary: 163 mg/dL — ABNORMAL HIGH (ref 70–99)
Glucose-Capillary: 181 mg/dL — ABNORMAL HIGH (ref 70–99)
Glucose-Capillary: 200 mg/dL — ABNORMAL HIGH (ref 70–99)

## 2020-06-27 LAB — MAGNESIUM: Magnesium: 2.3 mg/dL (ref 1.7–2.4)

## 2020-06-27 LAB — ANCA TITERS
Atypical P-ANCA titer: <1:20 {titer}
C-ANCA: <1:20 {titer}
P-ANCA: <1:20 {titer}

## 2020-06-27 LAB — PHOSPHORUS: Phosphorus: 3.3 mg/dL (ref 2.5–4.6)

## 2020-06-27 LAB — APTT: aPTT: 69 seconds — ABNORMAL HIGH (ref 24–36)

## 2020-06-27 LAB — AMMONIA: Ammonia: 18 umol/L (ref 9–35)

## 2020-06-27 LAB — HEPARIN LEVEL (UNFRACTIONATED): Heparin Unfractionated: 0.36 IU/mL (ref 0.30–0.70)

## 2020-06-27 MED ORDER — POTASSIUM CHLORIDE 10 MEQ/50ML IV SOLN
10.0000 meq | INTRAVENOUS | Status: AC
  Administered 2020-06-27 (×4): 10 meq via INTRAVENOUS
  Filled 2020-06-27 (×4): qty 50

## 2020-06-27 MED ORDER — LEVETIRACETAM 100 MG/ML PO SOLN
500.0000 mg | Freq: Two times a day (BID) | ORAL | Status: DC
  Administered 2020-06-27 – 2020-07-26 (×59): 500 mg
  Filled 2020-06-27 (×60): qty 5

## 2020-06-27 MED ORDER — DIGOXIN 0.0625 MG HALF TABLET
0.0625 mg | ORAL_TABLET | Freq: Every day | ORAL | Status: DC
  Administered 2020-06-29 – 2020-07-01 (×3): 0.0625 mg
  Filled 2020-06-27 (×3): qty 1

## 2020-06-27 NOTE — Plan of Care (Signed)
  Problem: Education: Goal: Knowledge of General Education information will improve Description: Including pain rating scale, medication(s)/side effects and non-pharmacologic comfort measures Outcome: Not Progressing   Problem: Health Behavior/Discharge Planning: Goal: Ability to manage health-related needs will improve Outcome: Not Progressing   Problem: Clinical Measurements: Goal: Ability to maintain clinical measurements within normal limits will improve Outcome: Progressing Goal: Will remain free from infection Outcome: Not Progressing Goal: Respiratory complications will improve Outcome: Progressing   Problem: Clinical Measurements: Goal: Will remain free from infection Outcome: Not Progressing   Problem: Clinical Measurements: Goal: Respiratory complications will improve Outcome: Progressing   Problem: Activity: Goal: Risk for activity intolerance will decrease Outcome: Not Progressing

## 2020-06-27 NOTE — Progress Notes (Addendum)
Advanced Heart Failure Rounding Note   Subjective:    Remains intubated on milrinone and lasix gtt. Got metolazone yesterday with very brisk diuresis -4L. No weights recorded.   Co-ox inaccurate at 99% Creatinine 2.1 -> 2.2 Lasix gtt cut back to 10.   CVP much improved now 12-13 (was 30 on admit)  Objective:   Weight Range:  Vital Signs:   Temp:  [95.9 F (35.5 C)-98.2 F (36.8 C)] 96.8 F (36 C) (09/20 1000) Pulse Rate:  [58-103] 67 (09/20 1000) Resp:  [12-28] 12 (09/20 1000) BP: (101-130)/(59-90) 106/67 (09/20 1000) SpO2:  [83 %-100 %] 100 % (09/20 1057) Arterial Line BP: (105-163)/(57-93) 120/66 (09/20 1000) FiO2 (%):  [40 %] 40 % (09/20 1057) Last BM Date: 06/26/20  Weight change: Filed Weights   06/23/20 0500 06/24/20 0408 06/25/20 0500  Weight: 74.2 kg 77.9 kg 77.9 kg    Intake/Output:   Intake/Output Summary (Last 24 hours) at 06/27/2020 1106 Last data filed at 06/27/2020 1005 Gross per 24 hour  Intake 2073.72 ml  Output 6570 ml  Net -4496.28 ml     Physical Exam: General:  Intubated. Awake and follows commands HEENT: normal +ETT Neck: supple. + jvp to ear . Carotids 2+ bilat; no bruits. No lymphadenopathy or thryomegaly appreciated. Cor: PMI nondisplaced. Regular rate & rhythm. 2/6 TR + RV lift Lungs: clear Abdomen: soft, nontender, + distended. No hepatosplenomegaly. No bruits or masses. Good bowel sounds. Extremities: no cyanosis, clubbing, rash, 2+ edema with wounds + TEDs Neuro: awake on vent will follow commands   Telemetry: sinus 60-70s Personally reviewed    Labs: Basic Metabolic Panel: Recent Labs  Lab 06/22/20 1358 06/22/20 1508 06/23/20 1011 06/23/20 1011 06/23/20 1316 06/23/20 1716 06/24/20 0415 06/24/20 0743 06/24/20 0743 06/24/20 1555 06/24/20 1555 06/25/20 0805 06/26/20 0829 06/27/20 0544  NA  --    < > 134*   < >   < >  --   --  136  --  137  --  137 137 138  K  --    < > 3.7   < >   < >  --   --  2.9*  --  3.8   --  3.9 4.5 3.2*  CL  --    < > 99   < >  --   --   --  98  --  100  --  101 103 102  CO2  --    < > 23   < >  --   --   --  23  --  23  --  _0 GLUCOSE  --    < > 232*   < >  --   --   --  226*  --  167*  --  190* 155* 195*  BUN  --    < > 15   < >  --   --   --  23*  --  25*  --  30* 42* 55*  CREATININE  --    < > 1.45*   < >  --   --   --  1.54*  --  1.57*  --  1.72* 2.09* 2.21*  CALCIUM  --    < > 7.4*   < >  --   --   --  7.4*   < > 7.6*   < > 7.9* 8.5* 8.6*  MG 1.5*  --  1.4*  --   --  1.5* 2.1  --   --  2.7*  --   --   --   --   PHOS 5.7*  --  3.4  --   --  3.2 3.7  --   --  3.1  --   --   --   --    < > = values in this interval not displayed.    Liver Function Tests: Recent Labs  Lab 06/22/20 1139 06/23/20 0534 06/25/20 0805 06/26/20 0829 06/27/20 0544  AST 37 43* _0 ALT _1 ALKPHOS 112 91 103 123 116  BILITOT 2.9* 2.1* 1.5* 1.3* 1.4*  PROT 6.9 5.2* 6.3* 6.6 6.4*  ALBUMIN 2.2* 1.7* 1.9* 2.0* 1.8*   No results for input(s): LIPASE, AMYLASE in the last 168 hours. Recent Labs  Lab 06/22/20 1147  AMMONIA 157*    CBC: Recent Labs  Lab 06/22/20 1139 06/22/20 1140 06/23/20 0534 06/23/20 0541 06/23/20 1316 06/24/20 0415 06/25/20 0415 06/26/20 0500 06/27/20 0544  WBC 9.3   < > 9.4  --   --  17.5* 20.4* 15.5* 17.6*  NEUTROABS 7.4  --   --   --   --   --   --   --   --   HGB 13.2   < > 12.5   < > 15.6* 12.2 12.2 10.8* 11.5*  HCT 46.3*   < > 38.3   < > 46.0 37.2 37.4 34.4* 35.2*  MCV 111.0*   < > 96.7  --   --  97.1 98.9 100.0 99.2  PLT 248   < > 254  --   --  294 260 233 240   < > = values in this interval not displayed.    Cardiac Enzymes: No results for input(s): CKTOTAL, CKMB, CKMBINDEX, TROPONINI in the last 168 hours.  BNP: BNP (last 3 results) No results for input(s): BNP in the last 8760 hours.  ProBNP (last 3 results) No results for input(s): PROBNP in the last 8760 hours.    Other results:  Imaging: MR BRAIN WO  CONTRAST  Result Date: 06/25/2020 CLINICAL DATA:  59 year old female with recent altered mental status. Seizure. EXAM: MRI HEAD WITHOUT CONTRAST TECHNIQUE: Multiplanar, multiecho pulse sequences of the brain and surrounding structures were obtained without intravenous contrast. COMPARISON:  CT head, CTA head and neck 06/22/2020. Brain MRI 07/17/2007. FINDINGS: Brain: Generalized cerebral volume loss since 2008. No restricted diffusion to suggest acute infarction. No midline shift, mass effect, evidence of mass lesion, ventriculomegaly, extra-axial collection or acute intracranial hemorrhage. Cervicomedullary junction and pituitary are within normal limits. On thin slice coronal images mesial temporal lobe structures appear fairly symmetric and within normal limits. And elsewhere gray and white matter signal is within normal limits for age. No cortical encephalomalacia. No definite chronic cerebral blood products. The deep gray nuclei, brainstem and cerebellum are unremarkable. Vascular: Major intracranial vascular flow voids are stable since 2008. Skull and upper cervical spine: Negative visible cervical spine. Visualized bone marrow signal is within normal limits. Sinuses/Orbits: Disconjugate gaze but otherwise negative. Other: Intubated. Fluid in the pharynx. Mild right greater than left mastoid air cell fluid. Visible scalp and face appear negative. IMPRESSION: 1. No acute intracranial abnormality. 2. Generalized cerebral volume loss since 2008, otherwise unremarkable for age noncontrast MRI appearance of the brain. Electronically Signed   By: Genevie Ann M.D.   On: 06/25/2020 15:11   DG CHEST PORT 1 VIEW  Result Date: 06/27/2020 CLINICAL  DATA:  Respiratory arrest. EXAM: PORTABLE CHEST 1 VIEW COMPARISON:  June 23, 2020. FINDINGS: Stable cardiomediastinal silhouette. Endotracheal and nasogastric tubes are unchanged in position. Right internal jugular catheter is unchanged. No pneumothorax is noted.  Interstitial densities are noted in both lung bases concerning for pulmonary edema with small pleural effusions. Bony thorax is unremarkable. IMPRESSION: Stable support apparatus. Stable bibasilar interstitial densities are noted concerning for pulmonary edema with small pleural effusions. Electronically Signed   By: Marijo Conception M.D.   On: 06/27/2020 08:37     Medications:     Scheduled Medications: . amiodarone  400 mg Per Tube BID  . chlorhexidine gluconate (MEDLINE KIT)  15 mL Mouth Rinse BID  . Chlorhexidine Gluconate Cloth  6 each Topical Q0600  . digoxin  0.125 mg Per Tube Daily  . feeding supplement (PROSource TF)  90 mL Per Tube BID  . influenza vac split quadrivalent PF  0.5 mL Intramuscular Tomorrow-1000  . insulin aspart  0-9 Units Subcutaneous Q4H  . insulin aspart  3 Units Subcutaneous Q4H  . lactulose  30 g Per Tube TID  . mouth rinse  15 mL Mouth Rinse 10 times per day  . methylPREDNISolone (SOLU-MEDROL) injection  80 mg Intravenous Daily  . metolazone  5 mg Per Tube Daily  . pantoprazole sodium  40 mg Per Tube Daily    Infusions: . sodium chloride    . feeding supplement (VITAL 1.5 CAL) 45 mL/hr at 06/25/20 1300  . fentaNYL infusion INTRAVENOUS 200 mcg/hr (06/27/20 1000)  . furosemide (LASIX) infusion 10 mg/hr (06/27/20 1000)  . heparin 1,100 Units/hr (06/27/20 1000)  . levETIRAcetam Stopped (06/27/20 0335)  . milrinone 0.125 mcg/kg/min (06/27/20 1000)  . piperacillin-tazobactam (ZOSYN)  IV 12.5 mL/hr at 06/27/20 1000  . potassium chloride 10 mEq (06/27/20 1005)    PRN Medications: Place/Maintain arterial line **AND** sodium chloride, docusate, fentaNYL, midazolam, midazolam, polyethylene glycol    Plan/Discussion:    1. 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 Will eventually need repeat RHC and VQ  - RH  cath in 12/20 with mild PAH with normal RV function. - Echo 06/22/20 Echo LV small compressed from RV EF 45-50% RV massively dilated and severely hypokinetic. Severe TR RVSP 76 Severe D-sign. Moderate pericardial effusion  - PFTs much better than I expected based on CT chest. DLCO markedly reduced - Continues w/ evidence of severe RHF and volume overload.  - Remains on milrinone with improvement in co-ox  - Now mobilizing fluid. Would continue lasix gtt and daily metolazone at least one more day. Agree with runing down lasix slightly Check daily weights - Renal function slightly worse. Have to watch closely - Hopefully we can get her extubated soon. Will need RHC this week   2. Acute hypoxic respiratory failure - multifactorial including PAH, ILD, large R effusion, pulmonary edema and possible aspiration  - CCM managing vent - support RV as above.   3. 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 On lactulose - I will recheck today  4. CTD with h/o RA and SLE - As above, I am concerned about possible CTD flare. Will treat with solumedrol 1 gram burst x 3 days for now - follow serology  5. DVT/PE - diagnosed 6/21 at [place] - CT this  admit. Negative for acute PE - continue heparin   6. AFL/atrial tach with runs NSVT  - improved on amio.Now on po. Tele ok  - Keep K> 4.0 Mg > 2.0  7. UTI with ESBL (e.coli) - per CCM   CRITICAL CARE Performed by: Glori Bickers  Total critical care time: 35 minutes  Critical care time was exclusive of separately billable procedures and treating other patients.  Critical care was necessary to treat or prevent imminent or life-threatening deterioration.  Critical care was time spent personally by me (independent of midlevel providers or residents) on the following activities: development of treatment plan with patient and/or surrogate as well as nursing, discussions with consultants,  evaluation of patient's response to treatment, examination of patient, obtaining history from patient or surrogate, ordering and performing treatments and interventions, ordering and review of laboratory studies, ordering and review of radiographic studies, pulse oximetry and re-evaluation of patient's condition.     Length of Stay: [place] 06/27/2020, 11:06 AM  Advanced Heart Failure Team Pager [phone] (M-F; 7a - 4p)  Please contact [facility] for night-coverage after hours (4p -7a ) and weekends on [email or web address]

## 2020-06-27 NOTE — Progress Notes (Signed)
NAME:  Carolyn Proctor, MRN:  [id number], DOB:  1961-05-28, LOS: 5 ADMISSION DATE:  06/22/2020, CONSULTATION DATE:  06/22/2020 REFERRING MD:  Dr. Tyrone Nine, CHIEF COMPLAINT:  AMS/ refractory hypoglycemia  Brief History   37 yoF at home who complained to family she felt like she was having a stroke, then developed left facial droop and went unresponsive.  Required BVM by EMS and noted to be hypoglycemic and hypotensive.  Intubated in ER for airway protection.  CTH negative.  Refractory hypoglycemia despite multiple D50 amps now on drip.  PCCM called to admit.   History of present illness   HPI obtained from medical chart review and daughter, [name] at beside, as patient is currently intubated and sedated on mechanical ventilation.  59 year old female with prior history of PAF, DVT, and PE on Eliquis, RA,  hypercoagulable state (increased protein S antigen, decreased protein C activity, and decreased Antithrombin III), HFpEF, CKD stage III, centrilobular emphysema and pulmonary fibrosis on daily prednisone- never smoker, chronic venous insufficiency w/ chronic foot ulcers, and diverticulitis.   Daughter reports patient has not felt well since her hospitalization in June at [facility] where she was diagnosed with PE/ DVT and placed on Eliquis in which daughter states she is compliant with.  Since, patient also has been struggling with weight loss, early satiety, muscle weakness, and ongoing lower leg edema/ wounds.  Seen by GI and put on miralax per daughter for constipation.  Daughter also reports her prednisone has been recently tapered.    Patient was at home this morning around 1020, when she reported to her family that she felt like she was having a stroke. Family noticed a left facial droop and then patient proceeded to become unresponsive.  On EMS arrival, patient required continuous BVM. CBG noted 49, afebrile and was hypotensive requiring fluid bolus.  On arrival to ER, she remained altered  and required intubation for airway protection. 2L NS bolus given for hypotension.  She remained hypoglycemic despite multiple D50 requiring D10 gtt.  A right IJ central line was placed in ER. Labs noted for normal WBC/ Hgb, Na 132, CO2 10, sCr 1.38, AG 19, albumin 2.2, normal AST/ ALT, t. Bili 2.9, trop hs 34, LA 10.8, ETOH neg, ammonia 157. CT head without acute process.  CXR showing satisfactory position of ETT, layering right pleural effusion, diffuse interstitial thickening c/w hx of fibrosis.  ABG post intubation 6.954/ 43.4/ 31.3/ 9.6.  Patient remained normotensive but temp 95 requiring bairhugger.  PCCM called for admit.   Past Medical History  PAF, DVT, PE on Eliquis HFpEF CKD stage III RA Hypercoagulable state (increased protein S antigen, decreased protein C activity, and decreased Antithrombin III) Centrilobular emphysema and pulmonary fibrosis- non smoker Chronic venous insufficiency w/ chronic foot ulcers  Diverticulitis   Significant Hospital Events   9/15 admitted/ intubated  Consults:   Procedures:  9/15 ETT >> 9/15 R IJ TL CVC >> 9/15 OGT/ foley  Significant Diagnostic Tests:  9/15 CTH >> negative  9/15 CXR >> ETT 5.3 above carina, layering right pleural effusion, diffuse interstitial thickening c/w hx of fibrosis  9/15 CTA chest/ abd/ pelvis >> significant dilatation of RA and RV consistent with R heart failure; moderate R and small L pleural effusions with associated atelectasis of both lower lobes; underlying chronic cystic and fibrotic lung disease; severe anasarca; nodular contour of liver suggestive of possible cirrhosis 9/15 CTA head and neck >> no significant carotid or vertebral artery stenosis in neck,  no LVO, right jugular central venous catheter with probable surrounding hematoma  9/16 CXR >> stable mild b/l pulmonary edema with small pleural effusions. No PTX noted  Micro Data:  9/15 BCx2 >> 9/15 SARS2 >> neg 9/15 UC >> E coli  Antimicrobials:    9/15 vanc >> 9/17 9/15 zosyn >>  Interim history/subjective:  Mental status improved compared to yesterday. Able to follow simple commands such as squeezing fingers and wiggling toes.  Profoundly volume overloaded and low UOP despite lasix drip. Will plan for more aggressive diuresis today.  Will plan for MRI today as per neuro.  Objective   Blood pressure 106/67, pulse 67, temperature (!) 96.8 F (36 C), resp. rate 12, height _0  (1.803 m), weight 77.9 kg, last menstrual period 02/19/2013, SpO2 100 %. CVP:  [10 mmHg-31 mmHg] 16 mmHg  Vent Mode: PRVC FiO2 (%):  [40 %] 40 % Set Rate:  [28 bmp] 28 bmp Vt Set:  [500 mL] 500 mL PEEP:  [5 cmH20] 5 cmH20 Plateau Pressure:  [19 cmH20-25 cmH20] 25 cmH20   Intake/Output Summary (Last 24 hours) at 06/27/2020 1050 Last data filed at 06/27/2020 1005 Gross per 24 hour  Intake 2118.72 ml  Output 6570 ml  Net -4451.28 ml   Filed Weights   06/23/20 0500 06/24/20 0408 06/25/20 0500  Weight: 74.2 kg 77.9 kg 77.9 kg    Examination: General: ill-appearing adult female, intubated and mechanically ventilated, in NAD. HENT: mucous membranes pink but slightly dry, ETT and OGT in place.  Eyes: Pupils sluggishly reactive to light. CV: regular rate and irregular rhythm. 2/6 systolic murmur/TR noted. Pulm: mechanically ventilated breath sounds bilaterally, bibasilar crackles noted. GI: slightly firm and slightly distended abdomen. +BS. Neuro: Opens eyes to voice and is able to appropriately blink eyes to yes or no questions.   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 RHC 2020 PAmean 35, PCWP 12, PVR 4.5 WU, preserved CO and CI. TTE this admission with profound RV failure with volume/pressure overload.  She has signs of L heart failure with bilateral pleural effusions and pulmonary edema. UOP improved with increased lasix dosing, robust lat 24 hours.Marland Kitchen --Milrinone for inotrope and RV afterload reduction, fortunately not shunting much through chronic lung disease with systemic therapy, SvO2 encouraging --Decrease lasix gtt to 10 mg/hr , goal negative 1-2L --Continue metolazone  Acute hypoxemic respiratory failure: ILD, volume overload. --Continue ventilator --s/p pulse steroids, decrease to solumedrol 80 mg IV daily x 5 days, plan to wean aggressively thereafter --Diurese  E. Coli UTI: --Zosyn, plan 5 days, end date in  AKI: due to venous congestion and low CO. CO hopefully improved now feels warm. Still with volume overload. Cr seems to be plateauing --UOP robust  Best practice:  Diet: NPO, TF Pain/Anxiety/Delirium protocol (if indicated): fentanyl/versed VAP protocol (if indicated): yes DVT prophylaxis: Heparin per pharmacy GI prophylaxis: PPI Glucose control: insulin Mobility: bedrest Code Status: full  Family Communication: Daughter updated at bedside Disposition: ICU  Labs   CBC: Recent Labs  Lab 06/22/20 1139 06/22/20 1140 06/23/20 0534 06/23/20 0541 06/23/20 1316 06/24/20 0415 06/25/20 0415 06/26/20 0500 06/27/20 0544  WBC 9.3   < > 9.4  --   --  17.5* 20.4* 15.5* 17.6*  NEUTROABS 7.4  --   --   --   --   --   --   --   --  HGB 13.2   < > 12.5   < > 15.6* 12.2 12.2 10.8* 11.5*  HCT 46.3*   < > 38.3   < > 46.0 37.2 37.4 34.4* 35.2*  MCV 111.0*   < > 96.7  --   --  97.1 98.9 100.0 99.2  PLT 248   < > 254  --   --  294 260 233 240   < > = values in this interval not displayed.    Basic Metabolic Panel: Recent Labs  Lab 06/22/20 1358 06/22/20 1508 06/23/20 1011 06/23/20 1011 06/23/20 1316 06/23/20 1716 06/24/20 0415 06/24/20 0743 06/24/20 1555 06/25/20 0805 06/26/20 0829 06/27/20 0544  NA  --    < > 134*   < >   < >  --   --  136 137 137 137 138    K  --    < > 3.7   < >   < >  --   --  2.9* 3.8 3.9 4.5 3.2*  CL  --    < > 99   < >  --   --   --  98 100 101 103 102  CO2  --    < > 23   < >  --   --   --  _0 GLUCOSE  --    < > 232*   < >  --   --   --  226* 167* 190* 155* 195*  BUN  --    < > 15   < >  --   --   --  23* 25* 30* 42* 55*  CREATININE  --    < > 1.45*   < >  --   --   --  1.54* 1.57* 1.72* 2.09* 2.21*  CALCIUM  --    < > 7.4*   < >  --   --   --  7.4* 7.6* 7.9* 8.5* 8.6*  MG 1.5*  --  1.4*  --   --  1.5* 2.1  --  2.7*  --   --   --   PHOS 5.7*  --  3.4  --   --  3.2 3.7  --  3.1  --   --   --    < > = values in this interval not displayed.   GFR: Estimated Creatinine Clearance: 31.8 mL/min (A) (by C-G formula based on SCr of 2.21 mg/dL (H)). Recent Labs  Lab 06/22/20 1139 06/22/20 1429 06/23/20 0534 06/23/20 0534 06/23/20 1352 06/23/20 2214 06/24/20 0415 06/24/20 1552 06/25/20 0415 06/26/20 0500 06/27/20 0544  PROCALCITON  --   --  11.22  --   --   --   --   --   --   --   --   WBC   < >  --  9.4   < >  --   --  17.5*  --  20.4* 15.5* 17.6*  LATICACIDVEN  --  5.8*  --   --  7.9* 4.9*  --  3.1*  --   --   --    < > = values in this interval not displayed.    Liver Function Tests: Recent Labs  Lab 06/22/20 1139 06/23/20 0534 06/25/20 0805 06/26/20 0829 06/27/20 0544  AST 37 43* _1 ALT _2 ALKPHOS 112 91 103 123 116  BILITOT 2.9* 2.1* 1.5* 1.3* 1.4*  PROT 6.9 5.2* 6.3* 6.6 6.4*  ALBUMIN 2.2* 1.7* 1.9* 2.0* 1.8*   No results for input(s): LIPASE, AMYLASE in the last 168 hours. Recent Labs  Lab 06/22/20 1147  AMMONIA 157*    ABG    Component Value Date/Time   PHART 7.271 (L) 06/23/2020 1316   PCO2ART 33.0 06/23/2020 1316   PO2ART 357 (H) 06/23/2020 1316   HCO3 15.2 (L) 06/23/2020 1316   TCO2 16 (L) 06/23/2020 1316   ACIDBASEDEF 11.0 (H) 06/23/2020 1316   O2SAT 99.6 06/27/2020 0544     Coagulation Profile: Recent Labs  Lab 06/22/20 1358 06/23/20 0534   INR 4.7* 2.9*    Cardiac Enzymes: No results for input(s): CKTOTAL, CKMB, CKMBINDEX, TROPONINI in the last 168 hours.  HbA1C: Hgb A1c MFr Bld  Date/Time Value Ref Range Status  06/23/2020 05:34 AM 5.5 4.8 - 5.6 % Final    Comment:    (NOTE) Pre diabetes:          5.7%-6.4%  Diabetes:              >6.4%  Glycemic control for   <7.0% adults with diabetes   07/17/2007 02:46 PM   Final   5.3 (NOTE)   The ADA recommends the following therapeutic goals for glycemic   control related to Hgb A1C measurement:   Goal of Therapy:   < 7.0% Hgb A1C   Action Suggested:  > 8.0% Hgb A1C   Ref:  Diabetes Care, 22, Suppl. 1, 1999    CBG: Recent Labs  Lab 06/26/20 1635 06/26/20 2012 06/27/20 0013 06/27/20 0402 06/27/20 0826  GLUCAP 197* 169* 163* 200* 148*    Review of Systems:   Unable to perform  Past Medical History  She,  has a past medical history of Acute myopericarditis (02/24/2013), Atrial fibrillation ([place]), Atrial fibrillation with RVR ([place]) (02/22/2013), Chronic anticoagulation, Xarelto (04/02/2013), Chronic diastolic CHF (congestive heart failure) ([place]) (06/26/2019), Diverticulitis, Elevated troponin- secondary to Myopericarditis (02/25/2013), Emphysema  (07/07/2019), Hypertension, Lupus ([place]) (09/14/2011), 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   Surgical History    Past Surgical History:  Procedure Laterality Date  . BONE MARROW BIOPSY  2010  . CARDIAC CATHETERIZATION    . LEFT HEART CATHETERIZATION WITH CORONARY ANGIOGRAM N/A 02/24/2013   Procedure: LEFT HEART CATHETERIZATION WITH CORONARY  ANGIOGRAM;  Surgeon: Leonie Man, MD;  Location: [facility] CATH LAB;  Service: Cardiovascular;  Laterality: N/A;  . RIGHT/LEFT HEART CATH AND CORONARY ANGIOGRAPHY N/A 09/09/2019   Procedure: RIGHT/LEFT HEART CATH AND CORONARY ANGIOGRAPHY;  Surgeon: Jettie Booze, MD;  Location: [place] CV LAB;  Service: Cardiovascular;  Laterality: N/A;     Social History   reports that she has never smoked. She has never used smokeless tobacco. She reports that she does not drink alcohol and does not use drugs.   Family History   Her family history includes Hypertension in her maternal grandmother; Liver disease in her mother; Lupus in her brother and mother.   Allergies No Known Allergies   Home Medications  Prior to Admission medications   Medication Sig Start Date End Date Taking? Authorizing Provider  albuterol (VENTOLIN HFA) 108 (90 Base) MCG/ACT inhaler Inhale 2 puffs into the lungs  every 6 (six) hours as needed for wheezing or shortness of breath. 06/26/19   [place] Donnamarie Poag, DO  Calcium Carbonate-Vit D-Min (CALCIUM 1200 PO) Take 1 capsule by mouth daily.    [provider]  Cholecalciferol (VITAMIN D) 50 MCG (2000 UT) tablet Take 2,000 Units by mouth daily.     [provider]  Cyanocobalamin (VITAMIN B-12) 1000 MCG SUBL Place 1,000 mcg under the tongue.    [provider]  furosemide (LASIX) 20 MG tablet Take 1 tablet (20 mg total) by mouth 3 (three) times a week. 09/23/19   Jettie Booze, MD  pantoprazole (PROTONIX) 40 MG tablet Take 1 tablet (40 mg total) by mouth daily. 09/28/19 12/27/19  Margaretha Seeds, MD  predniSONE ([place]) 5 MG tablet TAKE 2 TABLETS BY MOUTH EVERY DAY 01/18/20   Margaretha Seeds, MD     Critical care time: CRITICAL CARE Performed by: Lanier Clam   Total critical care time: 31 minutes  Critical care time was exclusive of separately billable procedures and treating other patients.  Critical care was necessary to  treat or prevent imminent or life-threatening deterioration.  Critical care was time spent personally by me on the following activities: development of treatment plan with patient and/or surrogate as well as nursing, discussions with consultants, evaluation of patient's response to treatment, examination of patient, obtaining history from patient or surrogate, ordering and performing treatments and interventions, ordering and review of laboratory studies, ordering and review of radiographic studies, pulse oximetry and re-evaluation of patient's condition.

## 2020-06-27 NOTE — Progress Notes (Signed)
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), Afib, protein C deficiency  No Known Allergies  Patient Measurements: Height: 5\' 11"  (180.3 cm) Weight: 77.9 kg (171 lb 11.8 oz) IBW/kg (Calculated) : 70.8 Heparin Dosing Weight: 74.2 kg  Vital Signs: Temp: 96.8 F (36 C) (09/20 1000) Temp Source: Bladder (09/20 0800) BP: 106/67 (09/20 1000) Pulse Rate: 67 (09/20 1000)  Labs: Recent Labs    06/24/20 1555 06/25/20 0415 06/25/20 0805 06/25/20 1218 06/26/20 0500 06/26/20 0500 06/26/20 0829 06/26/20 1710 06/26/20 1902 06/27/20 0544  HGB   < > 12.2  --   --  10.8*  --   --   --   --  11.5*  HCT  --  37.4  --   --  34.4*  --   --   --   --  35.2*  PLT  --  260  --   --  233  --   --   --   --  240  APTT  --  93*  --    < > 64*   < >  --  163* 108* 69*  HEPARINUNFRC   < > 1.02*  --   --  0.53  --   --   --  0.60 0.36  CREATININE   < >  --  1.72*  --   --   --  2.09*  --   --  2.21*   < > = values in this interval not displayed.    Estimated Creatinine Clearance: 31.8 mL/min (A) (by C-G formula based on SCr of 2.21 mg/dL (H)).  Medications:  Infusions:  . sodium chloride    . feeding supplement (VITAL 1.5 CAL) 45 mL/hr at 06/25/20 1300  . fentaNYL infusion INTRAVENOUS 200 mcg/hr (06/27/20 1000)  . furosemide (LASIX) infusion 10 mg/hr (06/27/20 1000)  . heparin 1,100 Units/hr (06/27/20 1000)  . levETIRAcetam Stopped (06/27/20 0335)  . milrinone 0.125 mcg/kg/min (06/27/20 1000)  . piperacillin-tazobactam (ZOSYN)  IV 12.5 mL/hr at 06/27/20 1000  . potassium chloride 10 mEq (06/27/20 1005)    Assessment: 62 yof on Apixaban PTA for recent hx DVT/PE in June 2021, also with hx Afib with the last dose 9/14 PM. Now on hold and continuing on heparin for bridging. Will follow heparin levels only now with apixaban no longer influencing heparin levels.  APTT therapeutic at 69 this morning, correlating with  heparin level also therapeutic at 0.36. CBC stable. No bleeding or issues with infusion per discussion with RN.  Goal of Therapy:  Heparin level 0.3-0.7 units/ml Monitor platelets by anticoagulation protocol: Yes   Plan:  Continue heparin drip at 1100 units/hr D/c aPTT monitoring as correlating with heparin levels Monitor daily heparin level/CBC, s/sx bleeding   Arturo Morton, PharmD, BCPS Please check AMION for all [place] contact numbers Clinical Pharmacist 06/27/2020 11:00 AM

## 2020-06-28 DIAGNOSIS — G9340 Encephalopathy, unspecified: Secondary | ICD-10-CM | POA: Diagnosis not present

## 2020-06-28 DIAGNOSIS — N170 Acute kidney failure with tubular necrosis: Secondary | ICD-10-CM | POA: Diagnosis not present

## 2020-06-28 DIAGNOSIS — R570 Cardiogenic shock: Secondary | ICD-10-CM | POA: Diagnosis not present

## 2020-06-28 LAB — COOXEMETRY PANEL
Carboxyhemoglobin: 1.3 % (ref 0.5–1.5)
Carboxyhemoglobin: 1 % (ref 0.5–1.5)
Methemoglobin: 0.6 % (ref 0.0–1.5)
Methemoglobin: 0.6 % (ref 0.0–1.5)
O2 Saturation: 99.6 %
O2 Saturation: 68 %
Total hemoglobin: 13 g/dL (ref 12.0–16.0)
Total hemoglobin: 12.9 g/dL (ref 12.0–16.0)

## 2020-06-28 LAB — CBC
HCT: 38.6 % (ref 36.0–46.0)
Hemoglobin: 12.6 g/dL (ref 12.0–15.0)
MCH: 32 pg (ref 26.0–34.0)
MCHC: 32.6 g/dL (ref 30.0–36.0)
MCV: 98 fL (ref 80.0–100.0)
Platelets: 306 10*3/uL (ref 150–400)
RBC: 3.94 MIL/uL (ref 3.87–5.11)
RDW: 19.4 % — ABNORMAL HIGH (ref 11.5–15.5)
WBC: 22.4 10*3/uL — ABNORMAL HIGH (ref 4.0–10.5)
nRBC: 0.1 % (ref 0.0–0.2)

## 2020-06-28 LAB — COMPREHENSIVE METABOLIC PANEL
ALT: 23 U/L (ref 0–44)
AST: 25 U/L (ref 15–41)
Albumin: 2 g/dL — ABNORMAL LOW (ref 3.5–5.0)
Alkaline Phosphatase: 127 U/L — ABNORMAL HIGH (ref 38–126)
Anion gap: 15 (ref 5–15)
BUN: 65 mg/dL — ABNORMAL HIGH (ref 6–20)
CO2: 24 mmol/L (ref 22–32)
Calcium: 8.9 mg/dL (ref 8.9–10.3)
Chloride: 100 mmol/L (ref 98–111)
Creatinine, Ser: 2.28 mg/dL — ABNORMAL HIGH (ref 0.44–1.00)
GFR calc Af Amer: 27 mL/min — ABNORMAL LOW (ref >60)
GFR calc non Af Amer: 23 mL/min — ABNORMAL LOW (ref >60)
Glucose, Bld: 143 mg/dL — ABNORMAL HIGH (ref 70–99)
Potassium: 3 mmol/L — ABNORMAL LOW (ref 3.5–5.1)
Sodium: 139 mmol/L (ref 135–145)
Total Bilirubin: 1.2 mg/dL (ref 0.3–1.2)
Total Protein: 6.5 g/dL (ref 6.5–8.1)

## 2020-06-28 LAB — BASIC METABOLIC PANEL
Anion gap: 18 — ABNORMAL HIGH (ref 5–15)
BUN: 73 mg/dL — ABNORMAL HIGH (ref 6–20)
CO2: 22 mmol/L (ref 22–32)
Calcium: 8.7 mg/dL — ABNORMAL LOW (ref 8.9–10.3)
Chloride: 98 mmol/L (ref 98–111)
Creatinine, Ser: 2.44 mg/dL — ABNORMAL HIGH (ref 0.44–1.00)
GFR calc Af Amer: 25 mL/min — ABNORMAL LOW (ref >60)
GFR calc non Af Amer: 21 mL/min — ABNORMAL LOW (ref >60)
Glucose, Bld: 279 mg/dL — ABNORMAL HIGH (ref 70–99)
Potassium: 3.5 mmol/L (ref 3.5–5.1)
Sodium: 138 mmol/L (ref 135–145)

## 2020-06-28 LAB — GLUCOSE, CAPILLARY
Glucose-Capillary: 111 mg/dL — ABNORMAL HIGH (ref 70–99)
Glucose-Capillary: 119 mg/dL — ABNORMAL HIGH (ref 70–99)
Glucose-Capillary: 212 mg/dL — ABNORMAL HIGH (ref 70–99)
Glucose-Capillary: 216 mg/dL — ABNORMAL HIGH (ref 70–99)
Glucose-Capillary: 224 mg/dL — ABNORMAL HIGH (ref 70–99)
Glucose-Capillary: 84 mg/dL (ref 70–99)

## 2020-06-28 LAB — HEPARIN LEVEL (UNFRACTIONATED): Heparin Unfractionated: 0.37 IU/mL (ref 0.30–0.70)

## 2020-06-28 MED ORDER — METOLAZONE 5 MG PO TABS
5.0000 mg | ORAL_TABLET | Freq: Every day | ORAL | Status: DC
  Administered 2020-06-28 – 2020-06-29 (×2): 5 mg
  Filled 2020-06-28 (×3): qty 1

## 2020-06-28 MED ORDER — FUROSEMIDE 10 MG/ML IJ SOLN
10.0000 mg/h | INTRAVENOUS | Status: DC
  Administered 2020-06-28 – 2020-06-29 (×2): 10 mg/h via INTRAVENOUS
  Filled 2020-06-28 (×3): qty 25

## 2020-06-28 MED ORDER — POTASSIUM CHLORIDE 20 MEQ PO PACK
40.0000 meq | PACK | Freq: Two times a day (BID) | ORAL | Status: DC
  Administered 2020-06-28 – 2020-06-30 (×5): 40 meq
  Filled 2020-06-28 (×5): qty 2

## 2020-06-28 NOTE — Progress Notes (Signed)
  Advanced Heart Failure Rounding Note   Subjective:    Remains intubated on milrinone and lasix gtt also getting metolazone. Weight down 14 pounds CVP 12-13  Co-ox inaccurate at 99% was 68% yesterday BMET pending    Awake on vent. Following commands    Objective:   Weight Range:  Vital Signs:   Temp:  [96.1 F (35.6 C)-98.4 F (36.9 C)] 97.7 F (36.5 C) (09/21 0600) Pulse Rate:  [34-85] 70 (09/21 0600) Resp:  [12-28] 28 (09/21 0600) BP: (99-152)/(49-89) 125/80 (09/21 0600) SpO2:  [98 %-100 %] 100 % (09/21 0600) Arterial Line BP: (112-165)/(50-87) 154/77 (09/21 0600) FiO2 (%):  [40 %] 40 % (09/21 0545) Weight:  [71.5 kg] 71.5 kg (09/21 0500) Last BM Date: 06/27/20  Weight change: Filed Weights   06/24/20 0408 06/25/20 0500 06/28/20 0500  Weight: 77.9 kg 77.9 kg 71.5 kg    Intake/Output:   Intake/Output Summary (Last 24 hours) at 06/28/2020 0720 Last data filed at 06/28/2020 0609 Gross per 24 hour  Intake 1436.82 ml  Output 6455 ml  Net -5018.18 ml     Physical Exam: General:  Intubated. Awake and follows commands HEENT: normal +ETT Neck: supple.JVP to jaw  Carotids 2+ bilat; no bruits. No lymphadenopathy or thryomegaly appreciated. Cor: PMI nondisplaced. Regular rate & rhythm. 2/6 TR Lungs: clear Abdomen: soft, nontender, nondistended. No hepatosplenomegaly. No bruits or masses. Good bowel sounds. Extremities: no cyanosis, clubbing, rash, 2+ edema + wounds Neuro: awake on vent following commands   Telemetry: sinus 70s Personally reviewed   Labs: Basic Metabolic Panel: Recent Labs  Lab 06/23/20 1011 06/23/20 1011 06/23/20 1316 06/23/20 1716 06/24/20 0415 06/24/20 0743 06/24/20 0743 06/24/20 1555 06/24/20 1555 06/25/20 0805 06/26/20 0829 06/27/20 0544 06/27/20 1130  NA 134*   < >   < >  --   --  136  --  137  --  137 137 138  --   K 3.7   < >   < >  --   --  2.9*  --  3.8  --  3.9 4.5 3.2*  --   CL 99   < >  --   --   --  98  --  100  --   101 103 102  --   CO2 23   < >  --   --   --  23  --  23  --  22 23 23  --   GLUCOSE 232*   < >  --   --   --  226*  --  167*  --  190* 155* 195*  --   BUN 15   < >  --   --   --  23*  --  25*  --  30* 42* 55*  --   CREATININE 1.45*   < >  --   --   --  1.54*  --  1.57*  --  1.72* 2.09* 2.21*  --   CALCIUM 7.4*   < >  --   --   --  7.4*   < > 7.6*   < > 7.9* 8.5* 8.6*  --   MG 1.4*  --   --  1.5* 2.1  --   --  2.7*  --   --   --   --  2.3  PHOS 3.4  --   --  3.2 3.7  --   --  3.1  --   --   --   --    3.3   < > = values in this interval not displayed.    Liver Function Tests: Recent Labs  Lab 06/22/20 1139 06/23/20 0534 06/25/20 0805 06/26/20 0829 06/27/20 0544  AST 37 43* _0 ALT _1 ALKPHOS 112 91 103 123 116  BILITOT 2.9* 2.1* 1.5* 1.3* 1.4*  PROT 6.9 5.2* 6.3* 6.6 6.4*  ALBUMIN 2.2* 1.7* 1.9* 2.0* 1.8*   No results for input(s): LIPASE, AMYLASE in the last 168 hours. Recent Labs  Lab 06/22/20 1147 06/27/20 1130  AMMONIA 157* 18    CBC: Recent Labs  Lab 06/22/20 1139 06/22/20 1140 06/24/20 0415 06/25/20 0415 06/26/20 0500 06/27/20 0544 06/28/20 0629  WBC 9.3   < > 17.5* 20.4* 15.5* 17.6* 22.4*  NEUTROABS 7.4  --   --   --   --   --   --   HGB 13.2   < > 12.2 12.2 10.8* 11.5* 12.6  HCT 46.3*   < > 37.2 37.4 34.4* 35.2* 38.6  MCV 111.0*   < > 97.1 98.9 100.0 99.2 98.0  PLT 248   < > 294 260 233 240 306   < > = values in this interval not displayed.    Cardiac Enzymes: No results for input(s): CKTOTAL, CKMB, CKMBINDEX, TROPONINI in the last 168 hours.  BNP: BNP (last 3 results) No results for input(s): BNP in the last 8760 hours.  ProBNP (last 3 results) No results for input(s): PROBNP in the last 8760 hours.    Other results:  Imaging: DG CHEST PORT 1 VIEW  Result Date: 06/27/2020 CLINICAL DATA:  Respiratory arrest. EXAM: PORTABLE CHEST 1 VIEW COMPARISON:  June 23, 2020. FINDINGS: Stable cardiomediastinal silhouette.  Endotracheal and nasogastric tubes are unchanged in position. Right internal jugular catheter is unchanged. No pneumothorax is noted. Interstitial densities are noted in both lung bases concerning for pulmonary edema with small pleural effusions. Bony thorax is unremarkable. IMPRESSION: Stable support apparatus. Stable bibasilar interstitial densities are noted concerning for pulmonary edema with small pleural effusions. Electronically Signed   By: Marijo Conception M.D.   On: 06/27/2020 08:37     Medications:     Scheduled Medications: . amiodarone  400 mg Per Tube BID  . chlorhexidine gluconate (MEDLINE KIT)  15 mL Mouth Rinse BID  . Chlorhexidine Gluconate Cloth  6 each Topical Q0600  . [START ON 06/29/2020] digoxin  0.0625 mg Per Tube Daily  . feeding supplement (PROSource TF)  90 mL Per Tube BID  . influenza vac split quadrivalent PF  0.5 mL Intramuscular Tomorrow-1000  . insulin aspart  0-9 Units Subcutaneous Q4H  . insulin aspart  3 Units Subcutaneous Q4H  . lactulose  30 g Per Tube TID  . levETIRAcetam  500 mg Per Tube BID  . mouth rinse  15 mL Mouth Rinse 10 times per day  . methylPREDNISolone (SOLU-MEDROL) injection  80 mg Intravenous Daily  . metolazone  5 mg Per Tube Daily  . pantoprazole sodium  40 mg Per Tube Daily    Infusions: . sodium chloride    . feeding supplement (VITAL 1.5 CAL) 45 mL/hr at 06/25/20 1300  . fentaNYL infusion INTRAVENOUS 200 mcg/hr (06/28/20 0609)  . furosemide (LASIX) infusion 10 mg/hr (06/28/20 0227)  . heparin 1,100 Units/hr (06/27/20 2000)  . milrinone 0.125 mcg/kg/min (06/27/20 2000)    PRN Medications: Place/Maintain arterial line **AND** sodium chloride, docusate, fentaNYL, midazolam, midazolam, polyethylene glycol    Plan/Discussion:  1. 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 Will  eventually need repeat RHC and VQ  - RH cath in 12/20 with mild PAH with normal RV function. - Echo 06/22/20 Echo LV small compressed from RV EF 45-50% RV massively dilated and severely hypokinetic. Severe TR RVSP 76 Severe D-sign. Moderate pericardial effusion  - PFTs much better than I expected based on CT chest. DLCO markedly reduced - She has severe RV failure.  - Improving with milrinone and IV lasix + metolazone. CO-ox 66% - Now mobilizing fluid. Would continue lasix gtt and daily metolazone at least one more day. D/w Dr. Carney Corners - Renal function stable - Hopefully we can get her extubated soon. RHC later this week   2. Acute hypoxic respiratory failure - multifactorial including PAH, ILD, large R effusion, pulmonary edema and possible aspiration  - CCM managing vent - support RV as above.   3. 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 On lactulose - Ammonia today 18  - wil stop lactulose for now. Recheck ammonia in several days  4. CTD with h/o RA and SLE - most markers negative  - not sure this is a flare.  - weaning steroids.   5. DVT/PE - diagnosed 6/21 at [place] - CT this admit. Negative for acute PE - continue heparin   6. AFL/atrial tach with runs NSVT  - quiescent on po amio - Keep K> 4.0 Mg > 2.0  7. UTI with ESBL (e.coli) - per CCM   CRITICAL CARE Performed by: Glori Bickers  Total critical care time: 35 minutes  Critical care time was exclusive of separately billable procedures and treating other patients.  Critical care was necessary to treat or prevent imminent or life-threatening deterioration.  Critical care was time spent personally by me (independent of midlevel providers or residents) on the following activities: development of treatment plan with patient and/or surrogate as well as nursing, discussions with consultants, evaluation of patient's response to treatment, examination  of patient, obtaining history from patient or surrogate, ordering and performing treatments and interventions, ordering and review of laboratory studies, ordering and review of radiographic studies, pulse oximetry and re-evaluation of patient's condition.     Length of Stay: [place] 06/28/2020, 7:20 AM  Advanced Heart Failure Team Pager [phone] (M-F; 7a - 4p)  Please contact [facility] for night-coverage after hours (4p -7a ) and weekends on [email or web address]

## 2020-06-28 NOTE — Progress Notes (Signed)
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), Afib, protein C deficiency  No Known Allergies  Patient Measurements: Height: 5\' 11"  (180.3 cm) Weight: 71.5 kg (157 lb 10.1 oz) IBW/kg (Calculated) : 70.8 Heparin Dosing Weight: 74.2 kg  Vital Signs: Temp: 97.7 F (36.5 C) (09/21 0600) BP: 125/80 (09/21 0600) Pulse Rate: 70 (09/21 0600)  Labs: Recent Labs    06/25/20 0805 06/26/20 0500 06/26/20 0500 06/26/20 0829 06/26/20 1710 06/26/20 1902 06/27/20 0544 06/28/20 0629  HGB  --  10.8*   < >  --   --   --  11.5* 12.6  HCT  --  34.4*  --   --   --   --  35.2* 38.6  PLT  --  233  --   --   --   --  240 306  APTT  --  64*   < >  --  163* 108* 69*  --   HEPARINUNFRC  --  0.53   < >  --   --  0.60 0.36 0.37  CREATININE   < >  --   --  2.09*  --   --  2.21* 2.28*   < > = values in this interval not displayed.    Estimated Creatinine Clearance: 30.8 mL/min (A) (by C-G formula based on SCr of 2.28 mg/dL (H)).  Medications:  Infusions:  . sodium chloride    . feeding supplement (VITAL 1.5 CAL) 45 mL/hr at 06/25/20 1300  . fentaNYL infusion INTRAVENOUS 200 mcg/hr (06/28/20 0609)  . furosemide (LASIX) infusion    . heparin 1,100 Units/hr (06/27/20 2000)  . milrinone 0.125 mcg/kg/min (06/27/20 2000)    Assessment: 56 yof on Apixaban PTA for recent hx DVT/PE in June 2021, also with hx Afib with the last dose 9/14 PM. Now on hold and continuing on heparin for bridging. Will follow heparin levels only now with apixaban no longer influencing heparin levels.  HL 0.37 at goal this morning on heparin drip 1100 uts/hr, CBC stable. No bleeding or issues with infusion per discussion with RN.  Goal of Therapy:  Heparin level 0.3-0.7 units/ml Monitor platelets by anticoagulation protocol: Yes   Plan:  Continue heparin drip at 1100 units/hr Monitor daily heparin level/CBC, s/sx bleeding   Bonnita Nasuti Pharm.D.  CPP, BCPS Clinical Pharmacist [phone] 06/28/2020 8:05 AM    Clinical Pharmacist 06/28/2020 8:00 AM

## 2020-06-28 NOTE — Progress Notes (Signed)
Nutrition Follow-up  DOCUMENTATION CODES:   Severe malnutrition in context of chronic illness  INTERVENTION:   Tube feeding via OG tube: Vital 1.5 at 45 ml/h (1080 ml per day) Prosource TF 90 ml BID  Provides 1780 kcal, 117 gm protein, 820 ml free water daily   NUTRITION DIAGNOSIS:   Severe Malnutrition related to chronic illness as evidenced by severe muscle depletion, energy intake < or equal to 75% for > or equal to 1 month, moderate fat depletion, edema.  Ongoing.   GOAL:   Patient will meet greater than or equal to 90% of their needs  Met with TF.   MONITOR:   TF tolerance, I & O's  REASON FOR ASSESSMENT:   Consult, Ventilator Enteral/tube feeding initiation and management  ASSESSMENT:   Pt with PMH of PAF, DVT and PE dx 03/2019 on Eliquis, RA, HF, centrilobular emphysema, pulmonary fibrosis on daily prednisone, and chronic venous insufficiency with chronic foot ulcers.Admitted with cardiogenic shock due to severe RV failure/cor pulmonale CTD flare.  Pt discussed during ICU rounds and with RN.  Spoke with pt's daughter who gives history. Per daughter pt began to lose weight in June 2021. Her usual weight prior to this was 140-145 lb. Chart review confirms this. She reports that the patient lost her appetite and instead of eating 3 meals per day was only able to eat a few bites of food at each meal. She reports that they determined that pt was very constipated and was put on miralax 2 days PTA. Per daughter pt has had a lot of stool output since admission. No abd xray since admission.   Per daughter pt did not have issues with edema PTA or her heart, reports all of these issues are new for the patient.   Pt is currently fluid overloaded so current weight is inaccurate. Pt with moderate to severe edema on BLE and moderate edema on BUE.    Patient is currently intubated on ventilator support MV: 14.1 L/min Temp (24hrs), Avg:98 F (36.7 C), Min:97.3 F (36.3 C),  Max:98.4 F (36.9 C)  Medications reviewed and include: SSI, 3 units novolog every 4 hours, solu-medrol, KCl 40 mEq daily Fentanyl Lasix Milrinone  Labs reviewed: K+ 3  CBG's: [phone]   I&O's: +138 ml UOP: 4595 ml  Stool: 1860 ml   NUTRITION - FOCUSED PHYSICAL EXAM:    Most Recent Value  Orbital Region Moderate depletion  Upper Arm Region No depletion  Thoracic and Lumbar Region No depletion  Buccal Region Unable to assess  Temple Region Severe depletion  Clavicle Bone Region Severe depletion  Clavicle and Acromion Bone Region Severe depletion  Scapular Bone Region Severe depletion  Dorsal Hand No depletion  Patellar Region No depletion  Anterior Thigh Region No depletion  Posterior Calf Region No depletion  Edema (RD Assessment) Severe  Hair Reviewed  Eyes Reviewed  Mouth Unable to assess  Skin Reviewed  Nails Reviewed       Diet Order:   Diet Order            Diet NPO time specified  Diet effective now                 EDUCATION NEEDS:   No education needs have been identified at this time  Skin:  Skin Assessment: Reviewed RN Assessment  Last BM:  1860 ml via rectal tube  Height:   Ht Readings from Last 1 Encounters:  06/22/20 '5\' 11"'  (1.803 m)    Weight:  Wt Readings from Last 1 Encounters:  06/28/20 71.5 kg    Ideal Body Weight:  70.4 kg  BMI:  Body mass index is 21.98 kg/m.  Estimated Nutritional Needs:   Kcal:  1700-1900  Protein:  100-130 grams  Fluid:  > 1.5 L/day  Lockie Pares., RD, LDN, CNSC See AMiON for contact information

## 2020-06-28 NOTE — Progress Notes (Addendum)
NAME:  Carolyn Proctor, MRN:  [id number], DOB:  1960/10/27, LOS: 6 ADMISSION DATE:  06/22/2020, CONSULTATION DATE:  06/22/2020 REFERRING MD:  Dr. Tyrone Nine, CHIEF COMPLAINT:  AMS/ refractory hypoglycemia  Brief History   28 yoF at home who complained to family she felt like she was having a stroke, then developed left facial droop and went unresponsive.  Required BVM by EMS and noted to be hypoglycemic and hypotensive.  Intubated in ER for airway protection.  CTH negative.  Refractory hypoglycemia despite multiple D50 amps now on drip.  PCCM called to admit.   History of present illness   HPI obtained from medical chart review and daughter, [name] at beside, as patient is currently intubated and sedated on mechanical ventilation.  59 year old female with prior history of PAF, DVT, and PE on Eliquis, RA,  hypercoagulable state (increased protein S antigen, decreased protein C activity, and decreased Antithrombin III), HFpEF, CKD stage III, centrilobular emphysema and pulmonary fibrosis on daily prednisone- never smoker, chronic venous insufficiency w/ chronic foot ulcers, and diverticulitis.   Daughter reports patient has not felt well since her hospitalization in June at [facility] where she was diagnosed with PE/ DVT and placed on Eliquis in which daughter states she is compliant with.  Since, patient also has been struggling with weight loss, early satiety, muscle weakness, and ongoing lower leg edema/ wounds.  Seen by GI and put on miralax per daughter for constipation.  Daughter also reports her prednisone has been recently tapered.    Patient was at home this morning around 1020, when she reported to her family that she felt like she was having a stroke. Family noticed a left facial droop and then patient proceeded to become unresponsive.  On EMS arrival, patient required continuous BVM. CBG noted 49, afebrile and was hypotensive requiring fluid bolus.  On arrival to ER, she remained altered  and required intubation for airway protection. 2L NS bolus given for hypotension.  She remained hypoglycemic despite multiple D50 requiring D10 gtt.  A right IJ central line was placed in ER. Labs noted for normal WBC/ Hgb, Na 132, CO2 10, sCr 1.38, AG 19, albumin 2.2, normal AST/ ALT, t. Bili 2.9, trop hs 34, LA 10.8, ETOH neg, ammonia 157. CT head without acute process.  CXR showing satisfactory position of ETT, layering right pleural effusion, diffuse interstitial thickening c/w hx of fibrosis.  ABG post intubation 6.954/ 43.4/ 31.3/ 9.6.  Patient remained normotensive but temp 95 requiring bairhugger.  PCCM called for admit.   Past Medical History  PAF, DVT, PE on Eliquis HFpEF CKD stage III RA Hypercoagulable state (increased protein S antigen, decreased protein C activity, and decreased Antithrombin III) Centrilobular emphysema and pulmonary fibrosis- non smoker Chronic venous insufficiency w/ chronic foot ulcers  Diverticulitis   Significant Hospital Events   9/15 admitted/ intubated  Consults:   Procedures:  9/15 ETT >> 9/15 R IJ TL CVC >> 9/15 OGT/ foley  Significant Diagnostic Tests:  9/15 CTH >> negative  9/15 CXR >> ETT 5.3 above carina, layering right pleural effusion, diffuse interstitial thickening c/w hx of fibrosis  9/15 CTA chest/ abd/ pelvis >> significant dilatation of RA and RV consistent with R heart failure; moderate R and small L pleural effusions with associated atelectasis of both lower lobes; underlying chronic cystic and fibrotic lung disease; severe anasarca; nodular contour of liver suggestive of possible cirrhosis 9/15 CTA head and neck >> no significant carotid or vertebral artery stenosis in neck,  no LVO, right jugular central venous catheter with probable surrounding hematoma  9/16 CXR >> stable mild b/l pulmonary edema with small pleural effusions. No PTX noted  9/18 MR BRAIN>> no acute intracranial abnormality  9/20 CXR>> stable b/l pulm edema and  pleural effusions   Micro Data:  9/15 BCx2 central line >> NG5D 9/15 BCx2 PIV >> Strept mitis 9/15 SARS2 >> neg 9/15 UC >> E coli 9/16 Trach asp cult >> Normal flora 9/18 UC >> ESBL E Coli  Antimicrobials:  9/15 vanc >> 9/17 9/15 zosyn >> 9/20  Interim history/subjective:  Able to follow simple commands such as squeezing fingers and wiggling toes.   Down 4.5 L UOP yesterday, with 2 BM.   MRI negative for intracranial abnormality. Neuro recs Keppra 582m BID indefintely and signed off  Objective   Blood pressure 125/78, pulse 68, temperature 98.2 F (36.8 C), resp. rate (!) 28, height _0  (1.803 m), weight 71.5 kg, last menstrual period 02/19/2013, SpO2 100 %. CVP:  [5 mmHg-24 mmHg] 5 mmHg  Vent Mode: PRVC FiO2 (%):  [40 %] 40 % Set Rate:  [28 bmp] 28 bmp Vt Set:  [500 mL] 500 mL PEEP:  [5 cmH20] 5 cmH20 Plateau Pressure:  [20 cmH20-26 cmH20] 20 cmH20   Intake/Output Summary (Last 24 hours) at 06/28/2020 1017 Last data filed at 06/28/2020 1000 Gross per 24 hour  Intake 2411.29 ml  Output 5385 ml  Net -2973.71 ml   Filed Weights   06/24/20 0408 06/25/20 0500 06/28/20 0500  Weight: 77.9 kg 77.9 kg 71.5 kg    Examination: General: ill-appearing adult female, intubated and mechanically ventilated, in NAD. HENT: mucous membranes pink but slightly dry, ETT and OGT in place.  Eyes: PERRLA, EOM not tracking but intact CV: regular rate and irregular rhythm. 2/6 systolic murmur/TR noted. Pulm: mechanically ventilated breath sounds bilaterally, bibasilar crackles noted. GI: slightly firm and slightly distended abdomen. +BS. Neuro: Opens eyes to voice and is able to appropriately blink eyes to yes or no questions. Squeezes hands to command. 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 RHC 2020 PAmean 35, PCWP 12, PVR 4.5 WU, preserved CO and CI. TTE this admission with profound RV failure with volume/pressure overload. She has signs of L heart failure with bilateral pleural effusions and pulmonary edema. UOP improved with increased lasix dosing, robust lat 24 hours.. -- Continue Milrinone for inotrope and RV afterload reduction, fortunately not shunting much through chronic lung disease with systemic therapy, SvO2 encouraging --Continue lasix gtt to 10 mg/hr, goal negative 1-2L --Continue metolazone  Acute hypoxemic respiratory failure: ILD, volume overload. --PSV during day, work towards SBT --s/p pulse steroids, decrease to solumedrol 80 mg IV daily x 5 days, plan to wean aggressively thereafter --Diurese  Acute on Chronic liver failure: NH3 elevated at 157 on admission, now down to 18 --D/c Lactulose given significant stool output  ESBL E. Coli UTI: --WBC up at 22 from 17 but afebrile, normal HR, normotensive, will track CBC daily -- Zosyn complete 06/27/20  AKI: due to venous congestion and low CO. CO hopefully improved now feels warm. Still with volume overload. Cr seems to be plateauing --UOP robust --Replaced  K   Best practice:  Diet: NPO, TF Pain/Anxiety/Delirium protocol (if indicated): fentanyl/versed VAP protocol (if indicated): yes DVT prophylaxis: Heparin per pharmacy GI prophylaxis: PPI Glucose control: insulin Mobility: bedrest Code Status: full  Family Communication: Daughter updated at bedside Disposition: ICU  Labs   CBC: Recent Labs  Lab 06/22/20 1139 06/22/20 1140 06/24/20 0415 06/25/20 0415 06/26/20 0500 06/27/20 0544 06/28/20 0629  WBC 9.3   < > 17.5* 20.4* 15.5* 17.6* 22.4*  NEUTROABS 7.4  --   --   --   --   --   --   HGB 13.2   < > 12.2 12.2 10.8* 11.5* 12.6  HCT 46.3*   < > 37.2 37.4 34.4*  35.2* 38.6  MCV 111.0*   < > 97.1 98.9 100.0 99.2 98.0  PLT 248   < > 294 260 233 240 306   < > = values in this interval not displayed.    Basic Metabolic Panel: Recent Labs  Lab 06/23/20 1011 06/23/20 1316 06/23/20 1716 06/24/20 0415 06/24/20 0743 06/24/20 1555 06/25/20 0805 06/26/20 0829 06/27/20 0544 06/27/20 1130 06/28/20 0629  NA 134*   < >  --   --    < > 137 137 137 138  --  139  K 3.7   < >  --   --    < > 3.8 3.9 4.5 3.2*  --  3.0*  CL 99  --   --   --    < > 100 101 103 102  --  100  CO2 23  --   --   --    < > _0 --  24  GLUCOSE 232*  --   --   --    < > 167* 190* 155* 195*  --  143*  BUN 15  --   --   --    < > 25* 30* 42* 55*  --  65*  CREATININE 1.45*  --   --   --    < > 1.57* 1.72* 2.09* 2.21*  --  2.28*  CALCIUM 7.4*  --   --   --    < > 7.6* 7.9* 8.5* 8.6*  --  8.9  MG 1.4*  --  1.5* 2.1  --  2.7*  --   --   --  2.3  --   PHOS 3.4  --  3.2 3.7  --  3.1  --   --   --  3.3  --    < > = values in this interval not displayed.   GFR: Estimated Creatinine Clearance: 30.8 mL/min (A) (by C-G formula based on SCr of 2.28 mg/dL (H)). Recent Labs  Lab 06/22/20 1139 06/22/20 1429 06/23/20 0534 06/23/20 1352 06/23/20 2214 06/24/20 0415 06/24/20 1552 06/25/20 0415 06/26/20 0500 06/27/20 0544 06/28/20 0629  PROCALCITON  --   --  11.22  --   --   --   --   --   --   --   --   WBC   < >  --  9.4  --   --    < >  --  20.4* 15.5* 17.6* 22.4*  LATICACIDVEN  --  5.8*  --  7.9* 4.9*  --  3.1*  --   --   --   --    < > = values in this interval not displayed.    Liver Function Tests: Recent Labs  Lab  06/23/20 0534 06/25/20 0805 06/26/20 0829 06/27/20 0544 06/28/20 0629  AST 43* _0 ALT _1 ALKPHOS 91 103 123 116 127*  BILITOT 2.1* 1.5* 1.3* 1.4* 1.2  PROT 5.2* 6.3* 6.6 6.4* 6.5  ALBUMIN 1.7* 1.9* 2.0* 1.8* 2.0*   No results for input(s): LIPASE, AMYLASE in the last 168 hours. Recent Labs  Lab 06/22/20 1147  06/27/20 1130  AMMONIA 157* 18    ABG    Component Value Date/Time   PHART 7.271 (L) 06/23/2020 1316   PCO2ART 33.0 06/23/2020 1316   PO2ART 357 (H) 06/23/2020 1316   HCO3 15.2 (L) 06/23/2020 1316   TCO2 16 (L) 06/23/2020 1316   ACIDBASEDEF 11.0 (H) 06/23/2020 1316   O2SAT 68.0 06/28/2020 0831     Coagulation Profile: Recent Labs  Lab 06/22/20 1358 06/23/20 0534  INR 4.7* 2.9*    Cardiac Enzymes: No results for input(s): CKTOTAL, CKMB, CKMBINDEX, TROPONINI in the last 168 hours.  HbA1C: Hgb A1c MFr Bld  Date/Time Value Ref Range Status  06/23/2020 05:34 AM 5.5 4.8 - 5.6 % Final    Comment:    (NOTE) Pre diabetes:          5.7%-6.4%  Diabetes:              >6.4%  Glycemic control for   <7.0% adults with diabetes   07/17/2007 02:46 PM   Final   5.3 (NOTE)   The ADA recommends the following therapeutic goals for glycemic   control related to Hgb A1C measurement:   Goal of Therapy:   < 7.0% Hgb A1C   Action Suggested:  > 8.0% Hgb A1C   Ref:  Diabetes Care, 22, Suppl. 1, 1999    CBG: Recent Labs  Lab 06/27/20 1604 06/27/20 2006 06/27/20 2353 06/28/20 0343 06/28/20 0809  GLUCAP 153* 161* 154* 119* 84    Review of Systems:   Unable to perform  Past Medical History  She,  has a past medical history of Acute myopericarditis (02/24/2013), Atrial fibrillation ([place]), Atrial fibrillation with RVR ([place]) (02/22/2013), Chronic anticoagulation, Xarelto (04/02/2013), Chronic diastolic CHF (congestive heart failure) ([place]) (06/26/2019), Diverticulitis, Elevated troponin- secondary to Myopericarditis (02/25/2013), Emphysema  (07/07/2019), Hypertension, Lupus ([place]) (09/14/2011), 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   Surgical History    Past Surgical History:  Procedure Laterality Date  . BONE MARROW BIOPSY  2010  . CARDIAC CATHETERIZATION    . LEFT HEART CATHETERIZATION WITH CORONARY ANGIOGRAM N/A 02/24/2013   Procedure: LEFT HEART CATHETERIZATION WITH CORONARY ANGIOGRAM;  Surgeon: Leonie Man, MD;  Location: [facility] CATH LAB;  Service: Cardiovascular;  Laterality: N/A;  . RIGHT/LEFT HEART CATH AND CORONARY ANGIOGRAPHY N/A 09/09/2019   Procedure: RIGHT/LEFT HEART CATH AND CORONARY ANGIOGRAPHY;  Surgeon: Jettie Booze, MD;  Location: [facility] LAB;  Service: Cardiovascular;  Laterality: N/A;     Social History   reports that she has never smoked. She has never used smokeless tobacco. She reports that she does not drink alcohol and does not use drugs.   Family History   Her family history includes Hypertension in her maternal grandmother; Liver disease in her mother; Lupus in her brother and  mother.   Allergies No Known Allergies   Home Medications  Prior to Admission medications   Medication Sig Start Date End Date Taking? Authorizing Provider  albuterol (VENTOLIN HFA) 108 (90 Base) MCG/ACT inhaler Inhale 2 puffs into the lungs every 6 (six) hours as needed for wheezing or shortness of breath. 06/26/19   [place] Donnamarie Poag, DO  Calcium Carbonate-Vit D-Min (CALCIUM 1200 PO) Take 1 capsule by mouth daily.    [provider]  Cholecalciferol (VITAMIN D) 50 MCG (2000 UT) tablet Take 2,000 Units by mouth daily.     [provider]  Cyanocobalamin (VITAMIN B-12) 1000 MCG SUBL Place 1,000 mcg under the tongue.    [provider]  furosemide (LASIX) 20 MG tablet Take 1 tablet (20 mg total) by mouth 3 (three) times a week. 09/23/19   Jettie Booze, MD  pantoprazole (PROTONIX) 40 MG tablet Take 1 tablet (40 mg total) by mouth daily. 09/28/19  12/27/19  Margaretha Seeds, MD  predniSONE ([place]) 5 MG tablet TAKE 2 TABLETS BY MOUTH EVERY DAY 01/18/20   Margaretha Seeds, MD    CRITICAL CARE Performed by: Bonna Gains Daimion Adamcik   Total critical care time: 32 minutes  Critical care time was exclusive of separately billable procedures and treating other patients.  Critical care was necessary to treat or prevent imminent or life-threatening deterioration.  Critical care was time spent personally by me on the following activities: development of treatment plan with patient and/or surrogate as well as nursing, discussions with consultants, evaluation of patient's response to treatment, examination of patient, obtaining history from patient or surrogate, ordering and performing treatments and interventions, ordering and review of laboratory studies, ordering and review of radiographic studies, pulse oximetry and re-evaluation of patient's condition.

## 2020-06-29 DIAGNOSIS — E43 Unspecified severe protein-calorie malnutrition: Secondary | ICD-10-CM | POA: Insufficient documentation

## 2020-06-29 LAB — COOXEMETRY PANEL
Carboxyhemoglobin: 1.4 % (ref 0.5–1.5)
Carboxyhemoglobin: 1.1 % (ref 0.5–1.5)
Methemoglobin: 0.6 % (ref 0.0–1.5)
Methemoglobin: 0.5 % (ref 0.0–1.5)
O2 Saturation: 99.8 %
O2 Saturation: 63.1 %
Total hemoglobin: 12.4 g/dL (ref 12.0–16.0)
Total hemoglobin: 12.3 g/dL (ref 12.0–16.0)

## 2020-06-29 LAB — CBC
HCT: 38.3 % (ref 36.0–46.0)
Hemoglobin: 12.1 g/dL (ref 12.0–15.0)
MCH: 31.3 pg (ref 26.0–34.0)
MCHC: 31.6 g/dL (ref 30.0–36.0)
MCV: 99.2 fL (ref 80.0–100.0)
Platelets: 314 10*3/uL (ref 150–400)
RBC: 3.86 MIL/uL — ABNORMAL LOW (ref 3.87–5.11)
RDW: 19.9 % — ABNORMAL HIGH (ref 11.5–15.5)
WBC: 20.8 10*3/uL — ABNORMAL HIGH (ref 4.0–10.5)
nRBC: 0.1 % (ref 0.0–0.2)

## 2020-06-29 LAB — AMMONIA: Ammonia: 31 umol/L (ref 9–35)

## 2020-06-29 LAB — COMPREHENSIVE METABOLIC PANEL
ALT: 23 U/L (ref 0–44)
AST: 23 U/L (ref 15–41)
Albumin: 2 g/dL — ABNORMAL LOW (ref 3.5–5.0)
Alkaline Phosphatase: 110 U/L (ref 38–126)
Anion gap: 15 (ref 5–15)
BUN: 76 mg/dL — ABNORMAL HIGH (ref 6–20)
CO2: 24 mmol/L (ref 22–32)
Calcium: 8.8 mg/dL — ABNORMAL LOW (ref 8.9–10.3)
Chloride: 100 mmol/L (ref 98–111)
Creatinine, Ser: 2.41 mg/dL — ABNORMAL HIGH (ref 0.44–1.00)
GFR calc Af Amer: 25 mL/min — ABNORMAL LOW (ref >60)
GFR calc non Af Amer: 22 mL/min — ABNORMAL LOW (ref >60)
Glucose, Bld: 258 mg/dL — ABNORMAL HIGH (ref 70–99)
Potassium: 3.7 mmol/L (ref 3.5–5.1)
Sodium: 139 mmol/L (ref 135–145)
Total Bilirubin: 1.1 mg/dL (ref 0.3–1.2)
Total Protein: 6.1 g/dL — ABNORMAL LOW (ref 6.5–8.1)

## 2020-06-29 LAB — GLUCOSE, CAPILLARY
Glucose-Capillary: 134 mg/dL — ABNORMAL HIGH (ref 70–99)
Glucose-Capillary: 154 mg/dL — ABNORMAL HIGH (ref 70–99)
Glucose-Capillary: 174 mg/dL — ABNORMAL HIGH (ref 70–99)
Glucose-Capillary: 184 mg/dL — ABNORMAL HIGH (ref 70–99)
Glucose-Capillary: 188 mg/dL — ABNORMAL HIGH (ref 70–99)
Glucose-Capillary: 245 mg/dL — ABNORMAL HIGH (ref 70–99)

## 2020-06-29 LAB — TRIGLYCERIDES: Triglycerides: 61 mg/dL (ref <150)

## 2020-06-29 LAB — HEPARIN LEVEL (UNFRACTIONATED): Heparin Unfractionated: 0.32 IU/mL (ref 0.30–0.70)

## 2020-06-29 MED ORDER — PROPOFOL 1000 MG/100ML IV EMUL
5.0000 ug/kg/min | INTRAVENOUS | Status: DC
  Administered 2020-06-30 – 2020-07-01 (×3): 20 ug/kg/min via INTRAVENOUS
  Filled 2020-06-29 (×3): qty 100

## 2020-06-29 MED ORDER — PREDNISONE 20 MG PO TABS
60.0000 mg | ORAL_TABLET | Freq: Every day | ORAL | Status: AC
  Administered 2020-06-29 – 2020-07-01 (×3): 60 mg
  Filled 2020-06-29 (×3): qty 3

## 2020-06-29 MED ORDER — PREDNISONE 20 MG PO TABS: 20.0000 mg | ORAL_TABLET | Freq: Every day | ORAL | Status: DC

## 2020-06-29 MED ORDER — PROPOFOL 1000 MG/100ML IV EMUL
INTRAVENOUS | Status: AC
  Administered 2020-06-29: 10 ug/kg/min via INTRAVENOUS
  Filled 2020-06-29: qty 100

## 2020-06-29 MED ORDER — PREDNISONE 20 MG PO TABS
40.0000 mg | ORAL_TABLET | Freq: Every day | ORAL | Status: AC
  Administered 2020-07-02 – 2020-07-04 (×3): 40 mg
  Filled 2020-06-29 (×3): qty 2

## 2020-06-29 NOTE — Progress Notes (Signed)
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), Afib, protein C deficiency  No Known Allergies  Patient Measurements: Height: 5\' 11"  (180.3 cm) Weight: 70.1 kg (154 lb 8.7 oz) IBW/kg (Calculated) : 70.8 Heparin Dosing Weight: 74.2 kg  Vital Signs: Temp: 98.1 F (36.7 C) (09/22 1100) BP: 131/95 (09/22 1100) Pulse Rate: 100 (09/22 1100)  Labs: Recent Labs    06/26/20 1710 06/26/20 1902 06/26/20 1902 06/27/20 0544 06/27/20 0544 06/28/20 0629 06/28/20 1724 06/29/20 0511  HGB  --   --   --  11.5*   < > 12.6  --  12.1  HCT  --   --   --  35.2*  --  38.6  --  38.3  PLT  --   --   --  240  --  306  --  314  APTT 163* 108*  --  69*  --   --   --   --   HEPARINUNFRC  --  0.60   < > 0.36  --  0.37  --  0.32  CREATININE  --   --   --  2.21*   < > 2.28* 2.44* 2.41*   < > = values in this interval not displayed.    Estimated Creatinine Clearance: 28.8 mL/min (A) (by C-G formula based on SCr of 2.41 mg/dL (H)).  Medications:  Infusions:  . sodium chloride    . feeding supplement (VITAL 1.5 CAL) 1,000 mL (06/29/20 1113)  . fentaNYL infusion INTRAVENOUS 200 mcg/hr (06/29/20 1100)  . furosemide (LASIX) infusion 10 mg/hr (06/29/20 1100)  . heparin 1,100 Units/hr (06/29/20 1100)  . milrinone 0.125 mcg/kg/min (06/29/20 1100)    Assessment: 13 yof on Apixaban PTA for recent hx DVT/PE in June 2021, also with hx Afib with the last dose 9/14 PM. Now on hold and continuing on heparin for bridging. Will follow heparin levels only now with apixaban no longer influencing heparin levels.  HL 0.32 at goal this morning on heparin drip 1100 uts/hr, h/h stable pltc stable. No bleeding or issues with infusion per discussion with RN.  Goal of Therapy:  Heparin level 0.3-0.7 units/ml Monitor platelets by anticoagulation protocol: Yes   Plan:  Continue heparin drip at 1100 units/hr Monitor daily heparin level/CBC, s/sx  bleeding   Bonnita Nasuti Pharm.D. CPP, BCPS Clinical Pharmacist [phone] 06/29/2020 12:24 PM

## 2020-06-29 NOTE — Progress Notes (Signed)
[facility] Progress Note Patient Name: Carolyn Proctor DOB: [birth year] MRN: [id number]   Date of Service  06/29/2020  HPI/Events of Note  Notified of agitation despite Fentanyl drip. Just given Versed push and Fentanyl bolus and patient appears awake. BP 155/82 HR 81  eICU Interventions  Ordered to start Propofol drip     Intervention Category Major Interventions: Respiratory failure - evaluation and management;Hypertension - evaluation and management Minor Interventions: Agitation / anxiety - evaluation and management  Judd Lien 06/29/2020, 8:18 PM

## 2020-06-29 NOTE — Progress Notes (Addendum)
Advanced Heart Failure Rounding Note   Subjective:    Remains intubated. Awake on vent but not following commands.   Fluid status improved. CVP 10. Continues on lasix gtt at 10 mg/hr. -3.7L in UOP yesterday. SCr stable at 2.4. K 3.7   Co-ox 63% on milrinone 0.125.   Objective:   Weight Range:  Vital Signs:   Temp:  [97.7 F (36.5 C)-99.3 F (37.4 C)] 98.1 F (36.7 C) (09/22 1100) Pulse Rate:  [64-145] 100 (09/22 1100) Resp:  [13-30] 28 (09/22 1100) BP: (102-167)/(60-112) 131/95 (09/22 1100) SpO2:  [96 %-100 %] 100 % (09/22 1100) Arterial Line BP: (117-201)/(62-122) 161/88 (09/22 1100) FiO2 (%):  [40 %] 40 % (09/22 1056) Weight:  [70.1 kg] 70.1 kg (09/22 0500) Last BM Date: 06/28/20  Weight change: Filed Weights   06/25/20 0500 06/28/20 0500 06/29/20 0500  Weight: 77.9 kg 71.5 kg 70.1 kg    Intake/Output:   Intake/Output Summary (Last 24 hours) at 06/29/2020 1237 Last data filed at 06/29/2020 1100 Gross per 24 hour  Intake 2057.75 ml  Output 4750 ml  Net -2692.25 ml     Physical Exam: CVP 10  General:  Thin/ chronically ill appearing. Intubated, awake on vent but not following commands HEENT: normal +ETT Neck: supple. JVP ~10 cm  Carotids 2+ bilat; no bruits. No lymphadenopathy or thryomegaly appreciated. Cor: PMI nondisplaced. Regular rhythm, tachy rate. 2/6 TR Lungs: intubated and clear Abdomen: soft, nontender, nondistended. No hepatosplenomegaly. No bruits or masses. Good bowel sounds. Extremities: no cyanosis, clubbing, rash,trace bilateral LEE + bilateral TED hoses Neuro: awake on vent. Moves extremities but not following commands   Telemetry: sinus tach low 100s    Labs: Basic Metabolic Panel: Recent Labs  Lab 06/23/20 1011 06/23/20 1316 06/23/20 1716 06/24/20 0415 06/24/20 0743 06/24/20 1555 06/25/20 0805 06/26/20 0829 06/26/20 0829 06/27/20 0544 06/27/20 0544 06/27/20 1130 06/28/20 0629 06/28/20 1724 06/29/20 0511  NA 134*   < >   --   --    < > 137   < > 137  --  138  --   --  139 138 139  K 3.7   < >  --   --    < > 3.8   < > 4.5  --  3.2*  --   --  3.0* 3.5 3.7  CL 99  --   --   --    < > 100   < > 103  --  102  --   --  100 98 100  CO2 23  --   --   --    < > 23   < > 23  --  23  --   --  _0 GLUCOSE 232*  --   --   --    < > 167*   < > 155*  --  195*  --   --  143* 279* 258*  BUN 15  --   --   --    < > 25*   < > 42*  --  55*  --   --  65* 73* 76*  CREATININE 1.45*  --   --   --    < > 1.57*   < > 2.09*  --  2.21*  --   --  2.28* 2.44* 2.41*  CALCIUM 7.4*  --   --   --    < > 7.6*   < > 8.5*   < >  8.6*   < >  --  8.9 8.7* 8.8*  MG 1.4*  --  1.5* 2.1  --  2.7*  --   --   --   --   --  2.3  --   --   --   PHOS 3.4  --  3.2 3.7  --  3.1  --   --   --   --   --  3.3  --   --   --    < > = values in this interval not displayed.    Liver Function Tests: Recent Labs  Lab 06/25/20 0805 06/26/20 0829 06/27/20 0544 06/28/20 0629 06/29/20 0511  AST _0 ALT _1 ALKPHOS 103 123 116 127* 110  BILITOT 1.5* 1.3* 1.4* 1.2 1.1  PROT 6.3* 6.6 6.4* 6.5 6.1*  ALBUMIN 1.9* 2.0* 1.8* 2.0* 2.0*   No results for input(s): LIPASE, AMYLASE in the last 168 hours. Recent Labs  Lab 06/27/20 1130  AMMONIA 18    CBC: Recent Labs  Lab 06/25/20 0415 06/26/20 0500 06/27/20 0544 06/28/20 0629 06/29/20 0511  WBC 20.4* 15.5* 17.6* 22.4* 20.8*  HGB 12.2 10.8* 11.5* 12.6 12.1  HCT 37.4 34.4* 35.2* 38.6 38.3  MCV 98.9 100.0 99.2 98.0 99.2  PLT 260 233 240 306 314    Cardiac Enzymes: No results for input(s): CKTOTAL, CKMB, CKMBINDEX, TROPONINI in the last 168 hours.  BNP: BNP (last 3 results) No results for input(s): BNP in the last 8760 hours.  ProBNP (last 3 results) No results for input(s): PROBNP in the last 8760 hours.    Other results:  Imaging: No results found.   Medications:     Scheduled Medications: . amiodarone  400 mg Per Tube BID  . chlorhexidine gluconate  (MEDLINE KIT)  15 mL Mouth Rinse BID  . Chlorhexidine Gluconate Cloth  6 each Topical Q0600  . digoxin  0.0625 mg Per Tube Daily  . feeding supplement (PROSource TF)  90 mL Per Tube BID  . influenza vac split quadrivalent PF  0.5 mL Intramuscular Tomorrow-1000  . insulin aspart  0-9 Units Subcutaneous Q4H  . insulin aspart  3 Units Subcutaneous Q4H  . levETIRAcetam  500 mg Per Tube BID  . mouth rinse  15 mL Mouth Rinse 10 times per day  . metolazone  5 mg Per Tube Daily  . pantoprazole sodium  40 mg Per Tube Daily  . potassium chloride  40 mEq Per Tube BID  . predniSONE  60 mg Per Tube Daily   Followed by  . [START ON 07/02/2020] predniSONE  40 mg Per Tube Daily   Followed by  . [START ON 07/05/2020] predniSONE  20 mg Per Tube Daily    Infusions: . sodium chloride    . feeding supplement (VITAL 1.5 CAL) 1,000 mL (06/29/20 1113)  . fentaNYL infusion INTRAVENOUS 200 mcg/hr (06/29/20 1100)  . furosemide (LASIX) infusion 10 mg/hr (06/29/20 1100)  . heparin 1,100 Units/hr (06/29/20 1100)  . milrinone 0.125 mcg/kg/min (06/29/20 1100)    PRN Medications: Place/Maintain arterial line **AND** sodium chloride, docusate, fentaNYL, midazolam, midazolam, polyethylene glycol    Plan/Discussion:    1. 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 Will eventually need repeat RHC and VQ  - RH cath in 12/20 with mild PAH  with normal RV function. - Echo 06/22/20 Echo LV small compressed from RV EF 45-50% RV massively dilated and severely hypokinetic. Severe TR RVSP 76 Severe D-sign. Moderate pericardial effusion  - PFTs much better than I expected based on CT chest. DLCO markedly reduced - She has severe RV failure.  - Improving with milrinone and IV lasix + metolazone. CO-ox 63% - Now mobilizing fluid. CVP down to 10. Continue lasix gtt at 10 mg/hr  -  Renal function stable - Hopefully we can get her extubated soon. RHC later this week   2. Acute hypoxic respiratory failure - multifactorial including PAH, ILD, large R effusion, pulmonary edema and possible aspiration  - CCM managing vent - support RV as above.   3. Cirrhosis with acute on chronic liver failure - suspect due to chronic RV failure - NH3 157 on admit, treated w lactulose (now off) - repeat Ammonia level today. Restart lactulose if elevated  4. CTD with h/o RA and SLE - most markers negative  - not sure this is a flare.  - weaning steroids.   5. DVT/PE - diagnosed 6/21 at [place] - CT this admit. Negative for acute PE - continue heparin   6. AFL/atrial tach with runs NSVT  - quiescent on po amio - Keep K> 4.0 Mg > 2.0  7. UTI with ESBL (e.coli) - per CCM    Length of Stay: [phone], PA-C  06/29/2020, 12:37 PM  Advanced Heart Failure Team Pager [phone] (M-F; 7a - 4p)  Please contact [facility] for night-coverage after hours (4p -7a ) and weekends on [email or web address]  Agree with above. Remains intubated. More encephalopathic today. Will move purposefully but will not follow commands.   Remains on milrinone and IV lasix. Co-ox ok. Volume status improving. Rhythm has been stable on po amio.   General:  Ill appearing. On vent. Will awaken but not follow commands. Grabs at ETT HEENT: normal + ETT Neck: supple. JVp 10 Carotids 2+ bilat; no bruits. No lymphadenopathy or thryomegaly appreciated. Cor: PMI nondisplaced. Regular rate & rhythm. 2/6 TR Lungs: clear Abdomen: soft, nontender, nondistended. No hepatosplenomegaly. No bruits or masses. Good bowel sounds. Extremities: no cyanosis, clubbing, rash, 2+ edema with wounds. + TED Neuro: On vent. Will awaken but not follow commands. Grabs at ETT  RV failure and volume status improving. Continue lasix gtt and milrinone for now. Lactulose stop so will recheck ammonia. Will need RHC +/- repeat echo  once extubated and less encephalopathic.   CRITICAL CARE Performed by: Glori Bickers  Total critical care time: 35 minutes  Critical care time was exclusive of separately billable procedures and treating other patients.  Critical care was necessary to treat or prevent imminent or life-threatening deterioration.  Critical care was time spent personally by me (independent of midlevel providers or residents) on the following activities: development of treatment plan with patient and/or surrogate as well as nursing, discussions with consultants, evaluation of patient's response to treatment, examination of patient, obtaining history from patient or surrogate, ordering and performing treatments and interventions, ordering and review of laboratory studies, ordering and review of radiographic studies, pulse oximetry and re-evaluation of patient's condition.  Glori Bickers, MD  2:58 PM

## 2020-06-29 NOTE — Progress Notes (Signed)
NAME:  Carolyn Proctor, MRN:  [id number], DOB:  02/02/1961, LOS: 7 ADMISSION DATE:  06/22/2020, CONSULTATION DATE:  06/22/2020 REFERRING MD:  Dr. Tyrone Nine, CHIEF COMPLAINT:  AMS/ refractory hypoglycemia  Brief History   78 yoF at home who complained to family she felt like she was having a stroke, then developed left facial droop and went unresponsive.  Required BVM by EMS and noted to be hypoglycemic and hypotensive.  Intubated in ER for airway protection.  CTH negative.  Refractory hypoglycemia despite multiple D50 amps now on drip.  PCCM called to admit.   History of present illness   HPI obtained from medical chart review and daughter, Carolyn Proctor at beside, as patient is currently intubated and sedated on mechanical ventilation.  59 year old female with prior history of PAF, DVT, and PE on Eliquis, RA,  hypercoagulable state (increased protein S antigen, decreased protein C activity, and decreased Antithrombin III), HFpEF, CKD stage III, centrilobular emphysema and pulmonary fibrosis on daily prednisone- never smoker, chronic venous insufficiency w/ chronic foot ulcers, and diverticulitis.   Daughter reports patient has not felt well since her hospitalization in June at [facility] where she was diagnosed with PE/ DVT and placed on Eliquis in which daughter states she is compliant with.  Since, patient also has been struggling with weight loss, early satiety, muscle weakness, and ongoing lower leg edema/ wounds.  Seen by GI and put on miralax per daughter for constipation.  Daughter also reports her prednisone has been recently tapered.    Patient was at home this morning around 1020, when she reported to her family that she felt like she was having a stroke. Family noticed a left facial droop and then patient proceeded to become unresponsive.  On EMS arrival, patient required continuous BVM. CBG noted 49, afebrile and was hypotensive requiring fluid bolus.  On arrival to ER, she remained altered  and required intubation for airway protection. 2L NS bolus given for hypotension.  She remained hypoglycemic despite multiple D50 requiring D10 gtt.  A right IJ central line was placed in ER. Labs noted for normal WBC/ Hgb, Na 132, CO2 10, sCr 1.38, AG 19, albumin 2.2, normal AST/ ALT, t. Bili 2.9, trop hs 34, LA 10.8, ETOH neg, ammonia 157. CT head without acute process.  CXR showing satisfactory position of ETT, layering right pleural effusion, diffuse interstitial thickening c/w hx of fibrosis.  ABG post intubation 6.954/ 43.4/ 31.3/ 9.6.  Patient remained normotensive but temp 95 requiring bairhugger.  PCCM called for admit.   Past Medical History  PAF, DVT, PE on Eliquis HFpEF CKD stage III RA Hypercoagulable state (increased protein S antigen, decreased protein C activity, and decreased Antithrombin III) Centrilobular emphysema and pulmonary fibrosis- non smoker Chronic venous insufficiency w/ chronic foot ulcers  Diverticulitis   Significant Hospital Events   9/15 admitted/ intubated  Consults:   Procedures:  9/15 ETT >> 9/15 R IJ TL CVC >> 9/15 OGT/ foley  Significant Diagnostic Tests:  9/15 CTH >> negative  9/15 CXR >> ETT 5.3 above carina, layering right pleural effusion, diffuse interstitial thickening c/w hx of fibrosis  9/15 CTA chest/ abd/ pelvis >> significant dilatation of RA and RV consistent with R heart failure; moderate R and small L pleural effusions with associated atelectasis of both lower lobes; underlying chronic cystic and fibrotic lung disease; severe anasarca; nodular contour of liver suggestive of possible cirrhosis 9/15 CTA head and neck >> no significant carotid or vertebral artery stenosis in neck,  no LVO, right jugular central venous catheter with probable surrounding hematoma  9/16 CXR >> stable mild b/l pulmonary edema with small pleural effusions. No PTX noted  9/18 MR BRAIN>> no acute intracranial abnormality  9/20 CXR>> stable b/l pulm edema and  pleural effusions   Micro Data:  9/15 BCx2 central line >> NG5D 9/15 BCx2 PIV >> Strept mitis 9/15 SARS2 >> neg 9/15 UC >> ESBL E coli 9/16 Trach asp cult >> Normal flora   Antimicrobials:  9/15 vanc >> 9/17 9/15 zosyn >> 9/20  Interim history/subjective:  9/22: not following commands. Becomes htn-ive and tachycardic when sedation weaned. Remains on milrinone and lasix gtt. coox being repeated. ~3.6L uop yesterday  Objective   Blood pressure (!) 165/112, pulse 64, temperature 98.2 F (36.8 C), resp. rate (!) 28, height 5\' 11"  (1.803 m), weight 70.1 kg, last menstrual period 02/19/2013, SpO2 100 %. CVP:  [5 mmHg-23 mmHg] 23 mmHg  Vent Mode: PRVC FiO2 (%):  [40 %] 40 % Set Rate:  [28 bmp] 28 bmp Vt Set:  [500 mL] 500 mL PEEP:  [5 cmH20] 5 cmH20 Plateau Pressure:  [20 cmH20-26 cmH20] 24 cmH20   Intake/Output Summary (Last 24 hours) at 06/29/2020 0803 Last data filed at 06/29/2020 0600 Gross per 24 hour  Intake 1484.49 ml  Output 3700 ml  Net -2215.51 ml   Filed Weights   06/25/20 0500 06/28/20 0500 06/29/20 0500  Weight: 77.9 kg 71.5 kg 70.1 kg    Examination: General: ill-appearing adult female, intubated and mechanically ventilated, in NAD. HENT: mucous membranes pink but slightly dry, ETT and OGT in place.  Eyes: PERRLA, an icteric CV: regular rate and irregular rhythm. 2/6 systolic murmur/TR noted. Pulm: mechanically ventilated breath sounds bilaterally, bibasilar crackles noted. GI: mildly distended abdomen +BS. Neuro: attempts to lift head when tactile and verbally stimulated but not following commands or opening eyes.  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 RHC 2020 PAmean 35, PCWP 12, PVR 4.5 WU, preserved CO and CI. TTE this admission with profound RV failure with volume/pressure overload. She has signs of L heart failure with bilateral pleural effusions and pulmonary edema. UOP improved with increased lasix dosing, robust lat 24 hours.. -- Continue Milrinone for inotrope and RV afterload reduction per hf team, fortunately not shunting much through chronic lung disease with systemic therapy -? If able to hold milrinone in light of when sedation weaned htn-ive and tachycardic ? If that is contributing.  --Continue lasix gtt to 10 mg/hr, goal negative 1-2L, per HF team --Continue metolazone  Acute hypoxemic respiratory failure: ILD, volume overload. --PSV during day, work towards SBT --s/p pulse steroids, decrease to solumedrol 80 mg IV daily x 5 days... taper po steroids placed --Diurese  Acute on Chronic liver failure: -improved ammonia at last check -noted nodular appearance on imaging.   ESBL E. Coli UTI: --WBC up at 20.8 but downtrending -remains afebrile -- Zosyn complete 06/27/20  AKI: due to venous congestion and low CO. CO hopefully improved now feels warm. Still with volume overload. Cr seems to be plateauing --UOP robust --Replaced K as needed  Best practice:  Diet: NPO, TF Pain/Anxiety/Delirium protocol (if indicated): fentanyl/versed  VAP protocol (if indicated): yes DVT prophylaxis: Heparin per pharmacy GI prophylaxis: PPI Glucose control: insulin Mobility: bedrest Code Status: full  Family Communication: Daughter updated via phone Disposition: ICU  Labs   CBC: Recent Labs  Lab 06/22/20 1139 06/22/20 1140 06/25/20 0415 06/26/20 0500 06/27/20 0544 06/28/20 0629 06/29/20 0511  WBC 9.3   < > 20.4* 15.5* 17.6* 22.4* 20.8*  NEUTROABS 7.4  --   --   --   --   --   --   HGB 13.2   < > 12.2 10.8* 11.5* 12.6 12.1  HCT 46.3*   < > 37.4 34.4* 35.2* 38.6 38.3  MCV 111.0*   < > 98.9 100.0 99.2 98.0 99.2  PLT  248   < > 260 233 240 306 314   < > = values in this interval not displayed.    Basic Metabolic Panel: Recent Labs  Lab 06/23/20 1011 06/23/20 1316 06/23/20 1716 06/24/20 0415 06/24/20 0743 06/24/20 1555 06/25/20 0805 06/26/20 0829 06/27/20 0544 06/27/20 1130 06/28/20 0629 06/28/20 1724 06/29/20 0511  NA 134*   < >  --   --    < > 137   < > 137 138  --  139 138 139  K 3.7   < >  --   --    < > 3.8   < > 4.5 3.2*  --  3.0* 3.5 3.7  CL 99  --   --   --    < > 100   < > 103 102  --  100 98 100  CO2 23  --   --   --    < > 23   < > 23 23  --  24 22 24   GLUCOSE 232*  --   --   --    < > 167*   < > 155* 195*  --  143* 279* 258*  BUN 15  --   --   --    < > 25*   < > 42* 55*  --  65* 73* 76*  CREATININE 1.45*  --   --   --    < > 1.57*   < > 2.09* 2.21*  --  2.28* 2.44* 2.41*  CALCIUM 7.4*  --   --   --    < > 7.6*   < > 8.5* 8.6*  --  8.9 8.7* 8.8*  MG 1.4*  --  1.5* 2.1  --  2.7*  --   --   --  2.3  --   --   --   PHOS 3.4  --  3.2 3.7  --  3.1  --   --   --  3.3  --   --   --    < > = values in this interval not displayed.   GFR: Estimated Creatinine Clearance: 28.8 mL/min (A) (by C-G formula based on SCr of 2.41 mg/dL (H)). Recent Labs  Lab 06/22/20 1139 06/22/20 1429 06/23/20 0534 06/23/20 1352 06/23/20 2214 06/24/20 0415 06/24/20 1552 06/25/20 0415 06/26/20 0500 06/27/20 0544 06/28/20 0629 06/29/20 0511  PROCALCITON  --   --  11.22  --   --   --   --   --   --   --   --   --   WBC   < >  --  9.4  --   --    < >  --    < > 15.5*  17.6* 22.4* 20.8*  LATICACIDVEN  --  5.8*  --  7.9* 4.9*  --  3.1*  --   --   --   --   --    < > = values in this interval not displayed.    Liver Function Tests: Recent Labs  Lab 06/25/20 0805 06/26/20 0829 06/27/20 0544 06/28/20 0629 06/29/20 0511  AST 24 22 24 25 23   ALT 20 20 19 23 23   ALKPHOS 103 123 116 127* 110  BILITOT 1.5* 1.3* 1.4* 1.2 1.1  PROT 6.3* 6.6 6.4* 6.5 6.1*  ALBUMIN 1.9* 2.0* 1.8* 2.0* 2.0*   No results  for input(s): LIPASE, AMYLASE in the last 168 hours. Recent Labs  Lab 06/22/20 1147 06/27/20 1130  AMMONIA 157* 18    ABG    Component Value Date/Time   PHART 7.271 (L) 06/23/2020 1316   PCO2ART 33.0 06/23/2020 1316   PO2ART 357 (H) 06/23/2020 1316   HCO3 15.2 (L) 06/23/2020 1316   TCO2 16 (L) 06/23/2020 1316   ACIDBASEDEF 11.0 (H) 06/23/2020 1316   O2SAT 99.8 06/29/2020 0629     Coagulation Profile: Recent Labs  Lab 06/22/20 1358 06/23/20 0534  INR 4.7* 2.9*    Cardiac Enzymes: No results for input(s): CKTOTAL, CKMB, CKMBINDEX, TROPONINI in the last 168 hours.  HbA1C: Hgb A1c MFr Bld  Date/Time Value Ref Range Status  06/23/2020 05:34 AM 5.5 4.8 - 5.6 % Final    Comment:    (NOTE) Pre diabetes:          5.7%-6.4%  Diabetes:              >6.4%  Glycemic control for   <7.0% adults with diabetes   07/17/2007 02:46 PM   Final   5.3 (NOTE)   The ADA recommends the following therapeutic goals for glycemic   control related to Hgb A1C measurement:   Goal of Therapy:   < 7.0% Hgb A1C   Action Suggested:  > 8.0% Hgb A1C   Ref:  Diabetes Care, 22, Suppl. 1, 1999    CBG: Recent Labs  Lab 06/28/20 1612 06/28/20 2004 06/28/20 2351 06/29/20 0347 06/29/20 0727  GLUCAP 224* 212* 216* 245* 184*     CRITICAL CARE Performed by: Audria Nine   Total critical care time: 34 minutes  Critical care time was exclusive of separately billable procedures and treating other patients.  Critical care was necessary to treat or prevent imminent or life-threatening deterioration.  Critical care was time spent personally by me on the following activities: development of treatment plan with patient and/or surrogate as well as nursing, discussions with consultants, evaluation of patient's response to treatment, examination of patient, obtaining history from patient or surrogate, ordering and performing treatments and interventions, ordering and review of laboratory studies,  ordering and review of radiographic studies, pulse oximetry and re-evaluation of patient's condition.

## 2020-06-30 LAB — GLUCOSE, CAPILLARY
Glucose-Capillary: 153 mg/dL — ABNORMAL HIGH (ref 70–99)
Glucose-Capillary: 105 mg/dL — ABNORMAL HIGH (ref 70–99)
Glucose-Capillary: 231 mg/dL — ABNORMAL HIGH (ref 70–99)
Glucose-Capillary: 145 mg/dL — ABNORMAL HIGH (ref 70–99)
Glucose-Capillary: 131 mg/dL — ABNORMAL HIGH (ref 70–99)
Glucose-Capillary: 161 mg/dL — ABNORMAL HIGH (ref 70–99)

## 2020-06-30 LAB — COOXEMETRY PANEL
Carboxyhemoglobin: 2 % — ABNORMAL HIGH (ref 0.5–1.5)
Carboxyhemoglobin: 1.7 % — ABNORMAL HIGH (ref 0.5–1.5)
Methemoglobin: 1 % (ref 0.0–1.5)
Methemoglobin: 1 % (ref 0.0–1.5)
O2 Saturation: 99.6 %
O2 Saturation: 73.9 %
Total hemoglobin: 14.6 g/dL (ref 12.0–16.0)
Total hemoglobin: 14.6 g/dL (ref 12.0–16.0)

## 2020-06-30 LAB — CBC
HCT: 44.6 % (ref 36.0–46.0)
Hemoglobin: 14.5 g/dL (ref 12.0–15.0)
MCH: 31.9 pg (ref 26.0–34.0)
MCHC: 32.5 g/dL (ref 30.0–36.0)
MCV: 98 fL (ref 80.0–100.0)
Platelets: 391 10*3/uL (ref 150–400)
RBC: 4.55 MIL/uL (ref 3.87–5.11)
RDW: 19.8 % — ABNORMAL HIGH (ref 11.5–15.5)
WBC: 20.2 10*3/uL — ABNORMAL HIGH (ref 4.0–10.5)
nRBC: 0.1 % (ref 0.0–0.2)

## 2020-06-30 LAB — COMPREHENSIVE METABOLIC PANEL
ALT: 24 U/L (ref 0–44)
AST: 24 U/L (ref 15–41)
Albumin: 2.1 g/dL — ABNORMAL LOW (ref 3.5–5.0)
Alkaline Phosphatase: 124 U/L (ref 38–126)
Anion gap: 16 — ABNORMAL HIGH (ref 5–15)
BUN: 89 mg/dL — ABNORMAL HIGH (ref 6–20)
CO2: 26 mmol/L (ref 22–32)
Calcium: 8.9 mg/dL (ref 8.9–10.3)
Chloride: 94 mmol/L — ABNORMAL LOW (ref 98–111)
Creatinine, Ser: 2.16 mg/dL — ABNORMAL HIGH (ref 0.44–1.00)
GFR calc Af Amer: 29 mL/min — ABNORMAL LOW (ref >60)
GFR calc non Af Amer: 25 mL/min — ABNORMAL LOW (ref >60)
Glucose, Bld: 255 mg/dL — ABNORMAL HIGH (ref 70–99)
Potassium: 4.6 mmol/L (ref 3.5–5.1)
Sodium: 136 mmol/L (ref 135–145)
Total Bilirubin: 1.3 mg/dL — ABNORMAL HIGH (ref 0.3–1.2)
Total Protein: 6.9 g/dL (ref 6.5–8.1)

## 2020-06-30 LAB — AMMONIA: Ammonia: 41 umol/L — ABNORMAL HIGH (ref 9–35)

## 2020-06-30 LAB — HEPARIN LEVEL (UNFRACTIONATED): Heparin Unfractionated: 0.35 IU/mL (ref 0.30–0.70)

## 2020-06-30 LAB — MAGNESIUM: Magnesium: 2.1 mg/dL (ref 1.7–2.4)

## 2020-06-30 MED ORDER — LACTULOSE 10 GM/15ML PO SOLN
30.0000 g | Freq: Two times a day (BID) | ORAL | Status: DC
  Administered 2020-06-30 – 2020-07-07 (×16): 30 g
  Filled 2020-06-30 (×16): qty 45

## 2020-06-30 MED ORDER — POTASSIUM CHLORIDE 20 MEQ PO PACK: 40.0000 meq | PACK | Freq: Every day | ORAL | Status: DC

## 2020-06-30 NOTE — Progress Notes (Signed)
NAME:  Carolyn Proctor, MRN:  [id number], DOB:  04/30/1961, LOS: 8 ADMISSION DATE:  06/22/2020, CONSULTATION DATE:  06/22/2020 REFERRING MD:  Dr. Tyrone Nine, CHIEF COMPLAINT:  AMS/ refractory hypoglycemia  Brief History   49 yoF at home who complained to family she felt like she was having a stroke, then developed left facial droop and went unresponsive.  Required BVM by EMS and noted to be hypoglycemic and hypotensive.  Intubated in ER for airway protection.  CTH negative.  Refractory hypoglycemia despite multiple D50 amps now on drip.  PCCM called to admit.   History of present illness   HPI obtained from medical chart review and daughter, [name] at beside, as patient is currently intubated and sedated on mechanical ventilation.  59 year old female with prior history of PAF, DVT, and PE on Eliquis, RA,  hypercoagulable state (increased protein S antigen, decreased protein C activity, and decreased Antithrombin III), HFpEF, CKD stage III, centrilobular emphysema and pulmonary fibrosis on daily prednisone- never smoker, chronic venous insufficiency w/ chronic foot ulcers, and diverticulitis.   Daughter reports patient has not felt well since her hospitalization in June at [facility] where she was diagnosed with PE/ DVT and placed on Eliquis in which daughter states she is compliant with.  Since, patient also has been struggling with weight loss, early satiety, muscle weakness, and ongoing lower leg edema/ wounds.  Seen by GI and put on miralax per daughter for constipation.  Daughter also reports her prednisone has been recently tapered.    Patient was at home this morning around 1020, when she reported to her family that she felt like she was having a stroke. Family noticed a left facial droop and then patient proceeded to become unresponsive.  On EMS arrival, patient required continuous BVM. CBG noted 49, afebrile and was hypotensive requiring fluid bolus.  On arrival to ER, she remained altered  and required intubation for airway protection. 2L NS bolus given for hypotension.  She remained hypoglycemic despite multiple D50 requiring D10 gtt.  A right IJ central line was placed in ER. Labs noted for normal WBC/ Hgb, Na 132, CO2 10, sCr 1.38, AG 19, albumin 2.2, normal AST/ ALT, t. Bili 2.9, trop hs 34, LA 10.8, ETOH neg, ammonia 157. CT head without acute process.  CXR showing satisfactory position of ETT, layering right pleural effusion, diffuse interstitial thickening c/w hx of fibrosis.  ABG post intubation 6.954/ 43.4/ 31.3/ 9.6.  Patient remained normotensive but temp 95 requiring bairhugger.  PCCM called for admit.   Past Medical History  PAF, DVT, PE on Eliquis HFpEF CKD stage III RA Hypercoagulable state (increased protein S antigen, decreased protein C activity, and decreased Antithrombin III) Centrilobular emphysema and pulmonary fibrosis- non smoker Chronic venous insufficiency w/ chronic foot ulcers  Diverticulitis   Significant Hospital Events   9/15 admitted/ intubated  Consults:   Procedures:  9/15 ETT >> 9/15 R IJ TL CVC >> 9/15 OGT/ foley  Significant Diagnostic Tests:  9/15 CTH >> negative  9/15 CXR >> ETT 5.3 above carina, layering right pleural effusion, diffuse interstitial thickening c/w hx of fibrosis  9/15 CTA chest/ abd/ pelvis >> significant dilatation of RA and RV consistent with R heart failure; moderate R and small L pleural effusions with associated atelectasis of both lower lobes; underlying chronic cystic and fibrotic lung disease; severe anasarca; nodular contour of liver suggestive of possible cirrhosis 9/15 CTA head and neck >> no significant carotid or vertebral artery stenosis in neck,  no LVO, right jugular central venous catheter with probable surrounding hematoma  9/16 CXR >> stable mild b/l pulmonary edema with small pleural effusions. No PTX noted  9/18 MR BRAIN>> no acute intracranial abnormality  9/20 CXR>> stable b/l pulm edema and  pleural effusions   Micro Data:  9/15 BCx2 central line >> NG5D 9/15 BCx2 PIV >> Strept mitis 9/15 SARS2 >> neg 9/15 UC >> ESBL E coli 9/16 Trach asp cult >> Normal flora   Antimicrobials:  9/15 vanc >> 9/17 9/15 zosyn >> 9/20  Interim history/subjective:  9/23: net negative 3.7L yesterday with >6L uop. Remains on milrinone and lasix gtt. Ammonia negative, still not following commands. ? If uremia at BUN 89 is playing role. Will send for cth. coox again 99... unsure where they are drawing the coox from in the early am as it appears to be the same issue. Recheck.  9/22: not following commands. Becomes htn-ive and tachycardic when sedation weaned. Remains on milrinone and lasix gtt. coox being repeated. ~3.6L uop yesterday  Objective   Blood pressure 140/82, pulse 73, temperature 98.1 F (36.7 C), resp. rate (!) 28, height 5\' 11"  (1.803 m), weight 67.9 kg, last menstrual period 02/19/2013, SpO2 98 %. CVP:  [6 mmHg-23 mmHg] 11 mmHg  Vent Mode: PRVC FiO2 (%):  [40 %] 40 % Set Rate:  [28 bmp] 28 bmp Vt Set:  [500 mL] 500 mL PEEP:  [5 cmH20] 5 cmH20 Plateau Pressure:  [22 cmH20-27 cmH20] 22 cmH20   Intake/Output Summary (Last 24 hours) at 06/30/2020 0824 Last data filed at 06/30/2020 0800 Gross per 24 hour  Intake 2488.81 ml  Output 5900 ml  Net -3411.19 ml   Filed Weights   06/28/20 0500 06/29/20 0500 06/30/20 0500  Weight: 71.5 kg 70.1 kg 67.9 kg    Examination: General: ill-appearing adult female, intubated and mechanically ventilated, in NAD. HENT: mucous membranes pink but slightly dry, ETT and OGT in place.  Eyes: PERRLA, an icteric CV: regular rate and irregular rhythm. 2/6 systolic murmur/TR noted. Pulm: mechanically ventilated breath sounds bilaterally, bibasilar crackles noted. GI: mildly distended abdomen +BS. Neuro: not following commands and poorly responsive today on fentanyl.  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 RHC 2020 PAmean 35, PCWP 12, PVR 4.5 WU, preserved CO and CI. TTE this admission with profound RV failure with volume/pressure overload. She has signs of L heart failure with bilateral pleural effusions and pulmonary edema. UOP improved with increased lasix dosing, robust lat 24 hours >6L -- Continue Milrinone for inotrope and RV afterload reduction per hf team, fortunately not shunting much through chronic lung disease with systemic therapy  --remains on lasix gtt to 10 mg/hr, goal negative 1-2L, per HF team --Continue metolazone  Acute hypoxemic respiratory failure: ILD, volume overload. --PSV during day, work towards SBT --prednisone taper --Diurese  Acute on Chronic liver failure: -improved ammonia at last check, still wnl but slightly increased from previous 18->31 -noted nodular appearance on imaging.   Acute encephalopathy of undetermined etiology:  -likely multifactorial with metabolic issues, and recent infection -ammonia negative -BUN is elevated at 89 -remains off sedation -likely cth vs mri brain warranted again if no  improvement soon  ESBL E. Coli UTI: --WBC stable around 20 -remains afebrile -- Zosyn complete 06/27/20  AKI: due to venous congestion and low CO. CO hopefully improved now feels warm. Still with volume overload. Cr seems to be plateauing but BUN is continuing to rise  --follow indices -follow uop -? If BUN of 89 is contributing to mental status.   Best practice:  Diet: NPO, TF Pain/Anxiety/Delirium protocol (if indicated): fentanyl/versed VAP protocol (if indicated): yes DVT prophylaxis: Heparin per pharmacy GI prophylaxis: PPI Glucose control: insulin Mobility: bedrest Code  Status: full  Family Communication: Aunt Ms. Jimmye Norman, updated via phone Disposition: ICU  Labs   CBC: Recent Labs  Lab 06/26/20 0500 06/27/20 0544 06/28/20 0629 06/29/20 0511 06/30/20 0615  WBC 15.5* 17.6* 22.4* 20.8* 20.2*  HGB 10.8* 11.5* 12.6 12.1 14.5  HCT 34.4* 35.2* 38.6 38.3 44.6  MCV 100.0 99.2 98.0 99.2 98.0  PLT 233 240 306 314 209    Basic Metabolic Panel: Recent Labs  Lab 06/23/20 1011 06/23/20 1316 06/23/20 1716 06/24/20 0415 06/24/20 0743 06/24/20 1555 06/25/20 0805 06/27/20 0544 06/27/20 1130 06/28/20 0629 06/28/20 1724 06/29/20 0511 06/30/20 0615  NA 134*   < >  --   --    < > 137   < > 138  --  139 138 139 136  K 3.7   < >  --   --    < > 3.8   < > 3.2*  --  3.0* 3.5 3.7 4.6  CL 99  --   --   --    < > 100   < > 102  --  100 98 100 94*  CO2 23  --   --   --    < > 23   < > 23  --  24 22 24 26   GLUCOSE 232*  --   --   --    < > 167*   < > 195*  --  143* 279* 258* 255*  BUN 15  --   --   --    < > 25*   < > 55*  --  65* 73* 76* 89*  CREATININE 1.45*  --   --   --    < > 1.57*   < > 2.21*  --  2.28* 2.44* 2.41* 2.16*  CALCIUM 7.4*  --   --   --    < > 7.6*   < > 8.6*  --  8.9 8.7* 8.8* 8.9  MG 1.4*   < > 1.5* 2.1  --  2.7*  --   --  2.3  --   --   --  2.1  PHOS 3.4  --  3.2 3.7  --  3.1  --   --  3.3  --   --   --   --    < > = values in this interval not displayed.   GFR: Estimated Creatinine Clearance: 30.8 mL/min (A) (by C-G formula based on SCr of 2.16 mg/dL (H)). Recent Labs  Lab 06/23/20 1352 06/23/20 2214 06/24/20 0415 06/24/20 1552 06/25/20 0415 06/27/20 0544 06/28/20 0629 06/29/20 0511 06/30/20 0615  WBC  --   --    < >  --    < > 17.6* 22.4* 20.8* 20.2*  LATICACIDVEN 7.9* 4.9*  --  3.1*  --   --   --   --   --    < > = values in this interval  not displayed.    Liver Function Tests: Recent Labs  Lab 06/26/20 0829 06/27/20 0544 06/28/20 0629 06/29/20 0511 06/30/20 0615  AST 22 24 25 23 24   ALT 20 19 23 23 24   ALKPHOS  123 116 127* 110 124  BILITOT 1.3* 1.4* 1.2 1.1 1.3*  PROT 6.6 6.4* 6.5 6.1* 6.9  ALBUMIN 2.0* 1.8* 2.0* 2.0* 2.1*   No results for input(s): LIPASE, AMYLASE in the last 168 hours. Recent Labs  Lab 06/27/20 1130 06/29/20 1319  AMMONIA 18 31    ABG    Component Value Date/Time   PHART 7.271 (L) 06/23/2020 1316   PCO2ART 33.0 06/23/2020 1316   PO2ART 357 (H) 06/23/2020 1316   HCO3 15.2 (L) 06/23/2020 1316   TCO2 16 (L) 06/23/2020 1316   ACIDBASEDEF 11.0 (H) 06/23/2020 1316   O2SAT 99.6 06/30/2020 0615     Coagulation Profile: No results for input(s): INR, PROTIME in the last 168 hours.  Cardiac Enzymes: No results for input(s): CKTOTAL, CKMB, CKMBINDEX, TROPONINI in the last 168 hours.  HbA1C: Hgb A1c MFr Bld  Date/Time Value Ref Range Status  06/23/2020 05:34 AM 5.5 4.8 - 5.6 % Final    Comment:    (NOTE) Pre diabetes:          5.7%-6.4%  Diabetes:              >6.4%  Glycemic control for   <7.0% adults with diabetes   07/17/2007 02:46 PM   Final   5.3 (NOTE)   The ADA recommends the following therapeutic goals for glycemic   control related to Hgb A1C measurement:   Goal of Therapy:   < 7.0% Hgb A1C   Action Suggested:  > 8.0% Hgb A1C   Ref:  Diabetes Care, 22, Suppl. 1, 1999    CBG: Recent Labs  Lab 06/29/20 1147 06/29/20 1603 06/29/20 1956 06/29/20 2353 06/30/20 0347  GLUCAP 134* 174* 188* 154* 153*     CRITICAL CARE Performed by: Audria Nine   Total critical care time: 39 minutes  Critical care time was exclusive of separately billable procedures and treating other patients.  Critical care was necessary to treat or prevent imminent or life-threatening deterioration.  Critical care was time spent personally by me on the following activities: development of treatment plan with patient and/or surrogate as well as nursing, discussions with consultants, evaluation of patient's response to treatment, examination of patient, obtaining history  from patient or surrogate, ordering and performing treatments and interventions, ordering and review of laboratory studies, ordering and review of radiographic studies, pulse oximetry and re-evaluation of patient's condition.

## 2020-06-30 NOTE — Progress Notes (Signed)
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), Afib, protein C deficiency  No Known Allergies  Patient Measurements: Height: 5\' 11"  (180.3 cm) Weight: 67.9 kg (149 lb 11.1 oz) IBW/kg (Calculated) : 70.8 Heparin Dosing Weight: 74.2 kg  Vital Signs: Temp: 98.2 F (36.8 C) (09/23 1000) BP: 116/68 (09/23 1000) Pulse Rate: 63 (09/23 1000)  Labs: Recent Labs    06/28/20 0629 06/28/20 0629 06/28/20 1724 06/29/20 0511 06/30/20 0615  HGB 12.6   < >  --  12.1 14.5  HCT 38.6  --   --  38.3 44.6  PLT 306  --   --  314 391  HEPARINUNFRC 0.37  --   --  0.32 0.35  CREATININE 2.28*   < > 2.44* 2.41* 2.16*   < > = values in this interval not displayed.    Estimated Creatinine Clearance: 30.8 mL/min (A) (by C-G formula based on SCr of 2.16 mg/dL (H)).  Medications:  Infusions:  . sodium chloride    . feeding supplement (VITAL 1.5 CAL) 45 mL/hr at 06/29/20 1900  . fentaNYL infusion INTRAVENOUS 150 mcg/hr (06/30/20 1000)  . heparin 1,100 Units/hr (06/30/20 1000)  . milrinone 0.125 mcg/kg/min (06/30/20 1000)  . propofol (DIPRIVAN) infusion 20 mcg/kg/min (06/30/20 1000)    Assessment: 38 yof on Apixaban PTA for recent hx DVT/PE in June 2021, also with hx Afib with the last dose 9/14 PM. Now on hold and continuing on heparin for bridging. Will follow heparin levels only now with apixaban no longer influencing heparin levels.  HL 0.35 at goal this morning on heparin drip 1100 uts/hr, h/h stable pltc stable. No bleeding or issues with infusion per discussion with RN.  Goal of Therapy:  Heparin level 0.3-0.7 units/ml Monitor platelets by anticoagulation protocol: Yes   Plan:  Continue heparin drip at 1100 units/hr Monitor daily heparin level/CBC, s/sx bleeding   Bonnita Nasuti Pharm.D. CPP, BCPS Clinical Pharmacist [phone] 06/30/2020 10:19 AM

## 2020-06-30 NOTE — Progress Notes (Addendum)
Advanced Heart Failure Rounding Note   Subjective:    Remains intubated. Awake on vent but not following commands. Ammonia level rechecked yesterday and normal at 31.   Volume status continues to improve, wt down an additional 5 lb. -6.3L in UOP yesterday. CVP down to 5 but still w/ thigh edema   SCr improving, 2.41>>2.16 however BUN is trending up, 76>>89.   Co-ox 74% on 0.125 of milrinone.   Remains in NSR on PO amio.   Objective:   Weight Range:  Vital Signs:   Temp:  [96.6 F (35.9 C)-98.1 F (36.7 C)] 98.1 F (36.7 C) (09/23 0800) Pulse Rate:  [55-114] 73 (09/23 0800) Resp:  [0-32] 28 (09/23 0800) BP: (100-159)/(60-123) 140/82 (09/23 0800) SpO2:  [96 %-100 %] 98 % (09/23 0800) Arterial Line BP: (121-196)/(65-125) 164/81 (09/23 0800) FiO2 (%):  [40 %] 40 % (09/23 0724) Weight:  [67.9 kg] 67.9 kg (09/23 0500) Last BM Date: 06/29/20  Weight change: Filed Weights   06/28/20 0500 06/29/20 0500 06/30/20 0500  Weight: 71.5 kg 70.1 kg 67.9 kg    Intake/Output:   Intake/Output Summary (Last 24 hours) at 06/30/2020 0836 Last data filed at 06/30/2020 0800 Gross per 24 hour  Intake 2488.81 ml  Output 5900 ml  Net -3411.19 ml     Physical Exam: CVP 5 General: chronically ill appearing. Intubated, awake on vent but not following commands HEENT: normal +ETT Neck: supple. JVP ~8 cm Carotids 2+ bilat; no bruits. No lymphadenopathy or thryomegaly appreciated. Cor: PMI nondisplaced. Regular rhythm, tachy rate. 2/6 TR Lungs: intubated, slightly course BS anteriorly  Abdomen: soft, nontender, nondistended. No hepatosplenomegaly. No bruits or masses. Good bowel sounds. Extremities: no cyanosis, clubbing, rash, 1+ bilateral LEE in thighs  Neuro: awake on vent. Not following commands   Telemetry: NSR 90s   Labs: Basic Metabolic Panel: Recent Labs  Lab 06/23/20 1011 06/23/20 1316 06/23/20 1716 06/24/20 0415 06/24/20 0743 06/24/20 1555 06/25/20 0805  06/27/20 0544 06/27/20 0544 06/27/20 1130 06/28/20 0629 06/28/20 0629 06/28/20 1724 06/29/20 0511 06/30/20 0615  NA 134*   < >  --   --    < > 137   < > 138  --   --  139  --  138 139 136  K 3.7   < >  --   --    < > 3.8   < > 3.2*  --   --  3.0*  --  3.5 3.7 4.6  CL 99  --   --   --    < > 100   < > 102  --   --  100  --  98 100 94*  CO2 23  --   --   --    < > 23   < > 23  --   --  24  --  _0 GLUCOSE 232*  --   --   --    < > 167*   < > 195*  --   --  143*  --  279* 258* 255*  BUN 15  --   --   --    < > 25*   < > 55*  --   --  65*  --  73* 76* 89*  CREATININE 1.45*  --   --   --    < > 1.57*   < > 2.21*  --   --  2.28*  --  2.44* 2.41* 2.16*  CALCIUM 7.4*  --   --   --    < > 7.6*   < > 8.6*   < >  --  8.9   < > 8.7* 8.8* 8.9  MG 1.4*   < > 1.5* 2.1  --  2.7*  --   --   --  2.3  --   --   --   --  2.1  PHOS 3.4  --  3.2 3.7  --  3.1  --   --   --  3.3  --   --   --   --   --    < > = values in this interval not displayed.    Liver Function Tests: Recent Labs  Lab 06/26/20 0829 06/27/20 0544 06/28/20 0629 06/29/20 0511 06/30/20 0615  AST _0 ALT _1 ALKPHOS 123 116 127* 110 124  BILITOT 1.3* 1.4* 1.2 1.1 1.3*  PROT 6.6 6.4* 6.5 6.1* 6.9  ALBUMIN 2.0* 1.8* 2.0* 2.0* 2.1*   No results for input(s): LIPASE, AMYLASE in the last 168 hours. Recent Labs  Lab 06/27/20 1130 06/29/20 1319  AMMONIA 18 31    CBC: Recent Labs  Lab 06/26/20 0500 06/27/20 0544 06/28/20 0629 06/29/20 0511 06/30/20 0615  WBC 15.5* 17.6* 22.4* 20.8* 20.2*  HGB 10.8* 11.5* 12.6 12.1 14.5  HCT 34.4* 35.2* 38.6 38.3 44.6  MCV 100.0 99.2 98.0 99.2 98.0  PLT 233 240 306 314 391    Cardiac Enzymes: No results for input(s): CKTOTAL, CKMB, CKMBINDEX, TROPONINI in the last 168 hours.  BNP: BNP (last 3 results) No results for input(s): BNP in the last 8760 hours.  ProBNP (last 3 results) No results for input(s): PROBNP in the last 8760 hours.    Other  results:  Imaging: No results found.   Medications:     Scheduled Medications: . amiodarone  400 mg Per Tube BID  . chlorhexidine gluconate (MEDLINE KIT)  15 mL Mouth Rinse BID  . Chlorhexidine Gluconate Cloth  6 each Topical Q0600  . digoxin  0.0625 mg Per Tube Daily  . feeding supplement (PROSource TF)  90 mL Per Tube BID  . influenza vac split quadrivalent PF  0.5 mL Intramuscular Tomorrow-1000  . insulin aspart  0-9 Units Subcutaneous Q4H  . insulin aspart  3 Units Subcutaneous Q4H  . levETIRAcetam  500 mg Per Tube BID  . mouth rinse  15 mL Mouth Rinse 10 times per day  . metolazone  5 mg Per Tube Daily  . pantoprazole sodium  40 mg Per Tube Daily  . potassium chloride  40 mEq Per Tube BID  . predniSONE  60 mg Per Tube Daily   Followed by  . [START ON 07/02/2020] predniSONE  40 mg Per Tube Daily   Followed by  . [START ON 07/05/2020] predniSONE  20 mg Per Tube Daily    Infusions: . sodium chloride    . feeding supplement (VITAL 1.5 CAL) 45 mL/hr at 06/29/20 1900  . fentaNYL infusion INTRAVENOUS 200 mcg/hr (06/30/20 0800)  . furosemide (LASIX) infusion 10 mg/hr (06/30/20 0800)  . heparin 1,100 Units/hr (06/30/20 0800)  . milrinone 0.125 mcg/kg/min (06/30/20 0800)  . propofol (DIPRIVAN) infusion 10 mcg/kg/min (06/30/20 0800)    PRN Medications: Place/Maintain arterial line **AND** sodium chloride, docusate, fentaNYL, midazolam, polyethylene glycol    Plan/Discussion:    1. Cardiogenic shock due to severe RV failure/cor pulmonale - suspect WHO group  1,3 predominantly. 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 Will eventually need repeat RHC and VQ  - RH cath in 12/20 with mild PAH with normal RV function. - Echo 06/22/20 Echo LV small compressed from RV EF 45-50% RV massively dilated and severely hypokinetic. Severe TR RVSP 76 Severe D-sign. Moderate pericardial effusion  - PFTs much better than I expected based on  CT chest. DLCO markedly reduced - She has severe RV failure.  - Improving with milrinone and IV lasix + metolazone. CO-ox 74% - Now mobilizing fluid. CVP down to 5 but continues w/ thigh edema (3rd spacing). BUN however trending  up. Will hold metolazone today and continue lasix gtt at 10 mg/hr. Continue LE compression. - Follow SCr/BUN  - Hopefully we can get her extubated soon. RHC after extubation  2. Acute hypoxic respiratory failure - multifactorial including PAH, ILD, large R effusion, pulmonary edema and possible aspiration  - CCM managing vent - support RV as above.   3. Cirrhosis with acute on chronic liver failure - suspect due to chronic RV failure - NH3 157 on admit, treated w lactulose (now off) - repeat Ammonia level again today. Restart lactulose if elevated  4. CTD with h/o RA and SLE - most markers negative  - not sure this is a flare.  - weaning steroids.   5. DVT/PE - diagnosed 6/21 at [place] - CT this admit. Negative for acute PE - continue heparin   6. AFL/atrial tach with runs NSVT  - quiescent on po amio - Keep K> 4.0 Mg > 2.0  7. UTI with ESBL (e.coli) - per CCM   Length of Stay: [address] PA-C  06/30/2020, 8:36 AM  Advanced Heart Failure Team Pager [phone] (M-F; 7a - 4p)  Please contact [facility] for night-coverage after hours (4p -7a ) and weekends on [email or web address]  Agree with above  Remains on vent. Sedated. Not following commands. On milrinone and lasix gtt at 10. CVP 5. Co-ox ok   General: Sedated on vent. Not arousing HEENT: normal Neck: supple. no JVD. Carotids 2+ bilat; no bruits. No lymphadenopathy or thryomegaly appreciated. Cor: PMI nondisplaced. Regular rate & rhythm. 2/6 TR Lungs: clear Abdomen: soft, nontender, nondistended. No hepatosplenomegaly. No bruits or masses. Good bowel sounds. Extremities: no cyanosis, clubbing, rash, edema Neuro: sedated on vent  Volume status much improved. CVP down. Edema  resolved. Co-ox looks good on milrinone. Will stop lasix (will not tolerate lower CVP with RV failure). Likely can start po torsemide in next day or two. Will wean milrinone tomorrow.   Remains encephalopathic. CCM managing. Hopefully can extubate soon. We will restart lactulose given rising ammonia. Rhythm stable on amio. Continue heparin. Discussed dosing with PharmD personally.   CRITICAL CARE Performed by: Glori Bickers  Total critical care time: 35 minutes  Critical care time was exclusive of separately billable procedures and treating other patients.  Critical care was necessary to treat or prevent imminent or life-threatening deterioration.  Critical care was time spent personally by me (independent of midlevel providers or residents) on the following activities: development of treatment plan with patient and/or surrogate as well as nursing, discussions with consultants, evaluation of patient's response to treatment, examination of patient, obtaining history from patient or surrogate, ordering and performing treatments and interventions, ordering and review of laboratory studies, ordering and review of radiographic studies, pulse oximetry and re-evaluation of patient's condition.  Glori Bickers, MD  2:32 PM

## 2020-07-01 ENCOUNTER — Inpatient Hospital Stay (HOSPITAL_COMMUNITY): Payer: BC Managed Care – PPO

## 2020-07-01 LAB — COMPREHENSIVE METABOLIC PANEL
ALT: 27 U/L (ref 0–44)
AST: 29 U/L (ref 15–41)
Albumin: 2.1 g/dL — ABNORMAL LOW (ref 3.5–5.0)
Alkaline Phosphatase: 121 U/L (ref 38–126)
Anion gap: 13 (ref 5–15)
BUN: 95 mg/dL — ABNORMAL HIGH (ref 6–20)
CO2: 27 mmol/L (ref 22–32)
Calcium: 8.9 mg/dL (ref 8.9–10.3)
Chloride: 101 mmol/L (ref 98–111)
Creatinine, Ser: 2.07 mg/dL — ABNORMAL HIGH (ref 0.44–1.00)
GFR calc Af Amer: 30 mL/min — ABNORMAL LOW (ref >60)
GFR calc non Af Amer: 26 mL/min — ABNORMAL LOW (ref >60)
Glucose, Bld: 143 mg/dL — ABNORMAL HIGH (ref 70–99)
Potassium: 4 mmol/L (ref 3.5–5.1)
Sodium: 141 mmol/L (ref 135–145)
Total Bilirubin: 1.3 mg/dL — ABNORMAL HIGH (ref 0.3–1.2)
Total Protein: 6.3 g/dL — ABNORMAL LOW (ref 6.5–8.1)

## 2020-07-01 LAB — COOXEMETRY PANEL
Carboxyhemoglobin: 1.7 % — ABNORMAL HIGH (ref 0.5–1.5)
Methemoglobin: 1 % (ref 0.0–1.5)
O2 Saturation: 76.5 %
Total hemoglobin: 14 g/dL (ref 12.0–16.0)

## 2020-07-01 LAB — GLUCOSE, CAPILLARY
Glucose-Capillary: 126 mg/dL — ABNORMAL HIGH (ref 70–99)
Glucose-Capillary: 137 mg/dL — ABNORMAL HIGH (ref 70–99)
Glucose-Capillary: 220 mg/dL — ABNORMAL HIGH (ref 70–99)
Glucose-Capillary: 221 mg/dL — ABNORMAL HIGH (ref 70–99)
Glucose-Capillary: 244 mg/dL — ABNORMAL HIGH (ref 70–99)
Glucose-Capillary: 98 mg/dL (ref 70–99)

## 2020-07-01 LAB — CBC
HCT: 42.2 % (ref 36.0–46.0)
Hemoglobin: 13.3 g/dL (ref 12.0–15.0)
MCH: 31.6 pg (ref 26.0–34.0)
MCHC: 31.5 g/dL (ref 30.0–36.0)
MCV: 100.2 fL — ABNORMAL HIGH (ref 80.0–100.0)
Platelets: 386 10*3/uL (ref 150–400)
RBC: 4.21 MIL/uL (ref 3.87–5.11)
RDW: 20.1 % — ABNORMAL HIGH (ref 11.5–15.5)
WBC: 22 10*3/uL — ABNORMAL HIGH (ref 4.0–10.5)
nRBC: 0.1 % (ref 0.0–0.2)

## 2020-07-01 LAB — DIGOXIN LEVEL: Digoxin Level: 1.6 ng/mL (ref 1.0–2.0)

## 2020-07-01 LAB — HEPARIN LEVEL (UNFRACTIONATED): Heparin Unfractionated: 0.45 IU/mL (ref 0.30–0.70)

## 2020-07-01 MED ORDER — DEXMEDETOMIDINE HCL IN NACL 400 MCG/100ML IV SOLN
0.4000 ug/kg/h | INTRAVENOUS | Status: DC
  Administered 2020-07-01: 0.4 ug/kg/h via INTRAVENOUS
  Administered 2020-07-01: 0.9 ug/kg/h via INTRAVENOUS
  Administered 2020-07-02: 0.8 ug/kg/h via INTRAVENOUS
  Administered 2020-07-02 – 2020-07-04 (×9): 0.9 ug/kg/h via INTRAVENOUS
  Administered 2020-07-05 – 2020-07-07 (×8): 0.8 ug/kg/h via INTRAVENOUS
  Administered 2020-07-08 (×2): 0.6 ug/kg/h via INTRAVENOUS
  Administered 2020-07-08: 0.8 ug/kg/h via INTRAVENOUS
  Administered 2020-07-09: 0.7 ug/kg/h via INTRAVENOUS
  Administered 2020-07-09: 0.6 ug/kg/h via INTRAVENOUS
  Administered 2020-07-10 (×2): 0.8 ug/kg/h via INTRAVENOUS
  Administered 2020-07-11: 0.5 ug/kg/h via INTRAVENOUS
  Administered 2020-07-12: 0.6 ug/kg/h via INTRAVENOUS
  Administered 2020-07-13: 0.3 ug/kg/h via INTRAVENOUS
  Filled 2020-07-01 (×35): qty 100

## 2020-07-01 NOTE — Progress Notes (Signed)
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), Afib, protein C deficiency  No Known Allergies  Patient Measurements: Height: 5\' 11"  (180.3 cm) Weight: 67.9 kg (149 lb 11.1 oz) IBW/kg (Calculated) : 70.8 Heparin Dosing Weight: 74.2 kg  Vital Signs: Temp: 98.6 F (37 C) (09/24 1200) Temp Source: Bladder (09/24 0400) BP: 124/70 (09/24 1200) Pulse Rate: 75 (09/24 1200)  Labs: Recent Labs    06/29/20 0511 06/29/20 0511 06/30/20 0615 07/01/20 0430 07/01/20 0843  HGB 12.1   < > 14.5 13.3  --   HCT 38.3  --  44.6 42.2  --   PLT 314  --  391 386  --   HEPARINUNFRC 0.32  --  0.35 0.45  --   CREATININE 2.41*  --  2.16*  --  2.07*   < > = values in this interval not displayed.    Estimated Creatinine Clearance: 32.1 mL/min (A) (by C-G formula based on SCr of 2.07 mg/dL (H)).  Medications:  Infusions:  . sodium chloride    . dexmedetomidine (PRECEDEX) IV infusion 0.4 mcg/kg/hr (07/01/20 1200)  . feeding supplement (VITAL 1.5 CAL) 45 mL/hr at 06/30/20 2000  . fentaNYL infusion INTRAVENOUS 100 mcg/hr (07/01/20 1200)  . heparin 1,100 Units/hr (07/01/20 1200)  . milrinone 0.125 mcg/kg/min (07/01/20 1200)  . propofol (DIPRIVAN) infusion Stopped (07/01/20 1051)    Assessment: 73 yof on Apixaban PTA for recent hx DVT/PE in June 2021, also with hx Afib with the last dose 9/14 PM. Now on hold and continuing on heparin for bridging. Will follow heparin levels only now with apixaban no longer influencing heparin levels.  HL 0.45 at goal this morning on heparin drip 1100 uts/hr, H/H stable pltc stable. No bleeding or issues with infusion per discussion with RN.  Goal of Therapy:  Heparin level 0.3-0.7 units/ml Monitor platelets by anticoagulation protocol: Yes   Plan:  Continue heparin drip at 1100 units/hr Monitor daily heparin level/CBC, s/sx bleeding  [place] Crane, Roylene Reason, [facility] Clinical  Pharmacist  07/01/2020 12:59 PM   [facility] pharmacy phone numbers are listed on [email or web address]

## 2020-07-01 NOTE — Progress Notes (Signed)
Advanced Heart Failure Rounding Note   Subjective:    Remains intubated. Awake but still not following commands.   Head CT today no acute process.   Now off lasix. Milrinone continues at 0.125.   Rhythm stable on po amio.   Objective:   Weight Range:  Vital Signs:   Temp:  [97.2 F (36.2 C)-99.3 F (37.4 C)] 99 F (37.2 C) (09/24 1600) Pulse Rate:  [63-102] 67 (09/24 1600) Resp:  [16-33] 28 (09/24 1600) BP: (88-139)/(51-96) 125/82 (09/24 1600) SpO2:  [100 %] 100 % (09/24 1600) Arterial Line BP: (94-149)/(48-80) 149/80 (09/24 1600) FiO2 (%):  [40 %] 40 % (09/24 1516) Last BM Date: 06/30/20  Weight change: Filed Weights   06/28/20 0500 06/29/20 0500 06/30/20 0500  Weight: 71.5 kg 70.1 kg 67.9 kg    Intake/Output:   Intake/Output Summary (Last 24 hours) at 07/01/2020 1722 Last data filed at 07/01/2020 1600 Gross per 24 hour  Intake 1569.85 ml  Output 2975 ml  Net -1405.15 ml     Physical Exam: CVP 7 General: chronically ill appearing. Intubated, awake on vent but not following commands HEENT: normal + ETT Neck: supple.  JVP 6-7 Carotids 2+ bilat; no bruits. No lymphadenopathy or thryomegaly appreciated. Cor: PMI nondisplaced. Regular rate & rhythm. 2/6 TR Lungs: clear Abdomen: soft, nontender, nondistended. No hepatosplenomegaly. No bruits or masses. Good bowel sounds. Extremities: no cyanosis, clubbing, rash, tr edema +TED + LE wounds Neuro: awake on vent. Not following comamnds  Telemetry: NSR 60-70s Personally reviewed   Labs: Basic Metabolic Panel: Recent Labs  Lab 06/27/20 0544 06/27/20 1130 06/28/20 0629 06/28/20 0629 06/28/20 1724 06/28/20 1724 06/29/20 0511 06/30/20 0615 07/01/20 0843  NA   < >  --  139  --  138  --  139 136 141  K   < >  --  3.0*  --  3.5  --  3.7 4.6 4.0  CL   < >  --  100  --  98  --  100 94* 101  CO2   < >  --  24  --  22  --  '24 26 27  ' GLUCOSE   < >  --  143*  --  279*  --  258* 255* 143*  BUN   < >  --  65*   --  73*  --  76* 89* 95*  CREATININE   < >  --  2.28*  --  2.44*  --  2.41* 2.16* 2.07*  CALCIUM   < >  --  8.9   < > 8.7*   < > 8.8* 8.9 8.9  MG  --  2.3  --   --   --   --   --  2.1  --   PHOS  --  3.3  --   --   --   --   --   --   --    < > = values in this interval not displayed.    Liver Function Tests: Recent Labs  Lab 06/27/20 0544 06/28/20 0629 06/29/20 0511 06/30/20 0615 07/01/20 0843  AST '24 25 23 24 29  ' ALT '19 23 23 24 27  ' ALKPHOS 116 127* 110 124 121  BILITOT 1.4* 1.2 1.1 1.3* 1.3*  PROT 6.4* 6.5 6.1* 6.9 6.3*  ALBUMIN 1.8* 2.0* 2.0* 2.1* 2.1*   No results for input(s): LIPASE, AMYLASE in the last 168 hours. Recent Labs  Lab 06/27/20 1130 06/29/20 1319 06/30/20  0957  AMMONIA 18 31 41*    CBC: Recent Labs  Lab 06/27/20 0544 06/28/20 0629 06/29/20 0511 06/30/20 0615 07/01/20 0430  WBC 17.6* 22.4* 20.8* 20.2* 22.0*  HGB 11.5* 12.6 12.1 14.5 13.3  HCT 35.2* 38.6 38.3 44.6 42.2  MCV 99.2 98.0 99.2 98.0 100.2*  PLT 240 306 314 391 386    Cardiac Enzymes: No results for input(s): CKTOTAL, CKMB, CKMBINDEX, TROPONINI in the last 168 hours.  BNP: BNP (last 3 results) No results for input(s): BNP in the last 8760 hours.  ProBNP (last 3 results) No results for input(s): PROBNP in the last 8760 hours.    Other results:  Imaging: CT HEAD WO CONTRAST  Result Date: 07/01/2020 CLINICAL DATA:  Neurologic deficit EXAM: CT HEAD WITHOUT CONTRAST TECHNIQUE: Contiguous axial images were obtained from the base of the skull through the vertex without intravenous contrast. COMPARISON:  MRI 06/25/2020, CT 06/22/2020 FINDINGS: Brain: No evidence of acute infarction, hemorrhage, hydrocephalus, extra-axial collection or mass lesion/mass effect. Mild cerebral volume loss. Vascular: No hyperdense vessel or unexpected calcification. Skull: Normal. Negative for fracture or focal lesion. Sinuses/Orbits: Unchanged partial bilateral mastoid opacification, right greater than  left. Paranasal sinuses are clear. Orbital structures unremarkable. Other: Fluid and debris within the posterior nasopharynx. IMPRESSION: 1. No acute intracranial findings. 2. Unchanged partial bilateral mastoid opacification, right greater than left. 3. Fluid and debris within the posterior nasopharynx. Electronically Signed   By: Davina Poke D.O.   On: 07/01/2020 10:55     Medications:     Scheduled Medications: . amiodarone  400 mg Per Tube BID  . chlorhexidine gluconate (MEDLINE KIT)  15 mL Mouth Rinse BID  . Chlorhexidine Gluconate Cloth  6 each Topical Q0600  . feeding supplement (PROSource TF)  90 mL Per Tube BID  . influenza vac split quadrivalent PF  0.5 mL Intramuscular Tomorrow-1000  . insulin aspart  0-9 Units Subcutaneous Q4H  . insulin aspart  3 Units Subcutaneous Q4H  . lactulose  30 g Per Tube BID  . levETIRAcetam  500 mg Per Tube BID  . mouth rinse  15 mL Mouth Rinse 10 times per day  . pantoprazole sodium  40 mg Per Tube Daily  . [START ON 07/02/2020] predniSONE  40 mg Per Tube Daily   Followed by  . [START ON 07/05/2020] predniSONE  20 mg Per Tube Daily    Infusions: . sodium chloride    . dexmedetomidine (PRECEDEX) IV infusion 0.9 mcg/kg/hr (07/01/20 1600)  . feeding supplement (VITAL 1.5 CAL) 1,000 mL (07/01/20 1656)  . fentaNYL infusion INTRAVENOUS 125 mcg/hr (07/01/20 1600)  . heparin 1,100 Units/hr (07/01/20 1600)  . milrinone 0.125 mcg/kg/min (07/01/20 1649)  . propofol (DIPRIVAN) infusion Stopped (07/01/20 1051)    PRN Medications: Place/Maintain arterial line **AND** sodium chloride, docusate, fentaNYL, midazolam, polyethylene glycol    Plan/Discussion:    1. 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 Will eventually need repeat RHC and VQ  - RH cath in 12/20 with mild PAH with normal RV function. -  Echo 06/22/20 Echo LV small compressed from RV EF 45-50% RV massively dilated and severely hypokinetic. Severe TR RVSP 76 Severe D-sign. Moderate pericardial effusion  - PFTs much better than I expected based on CT chest. DLCO markedly reduced - She has severe RV failure. Much improved with milrinone. Co-ox 75%. Likely can  stop soon.  - Volume status much improved. Weight down 22 pounds. Lasix stopped 9/23. Can restart po torsemide soon  - Renal function improving - Extubation limited by encephalopathy RHC after extubation  2. Severe encephalopathy - etiology unclear - initially improved now worse again - repeat head CT unrevealing.  - brain MRI on 9/18 with no evidence vasculitis (has received steroids) - may need repeat MRI and consider increasing steroids again - D/w Dr. Ruthann Cancer  3. Acute hypoxic respiratory failure - multifactorial including PAH, ILD, large R effusion, pulmonary edema and possible aspiration  - CCM managing vent. Now limited mostly by encephalopathy   4. 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 Now trending back up.  - lactulose restarted  5. CTD with h/o RA and SLE - most markers negative  - not clear evidence that this was a flare.  - weaning steroids.   6. DVT/PE - diagnosed 6/21 at [facility] - CT this admit. Negative for acute PE - continue heparin   6. AFL/atrial tach with runs NSVT  - quiescent on po amio - Keep K> 4.0 Mg > 2.0  7. UTI with ESBL (e.coli) - per CCM  CRITICAL CARE Performed by: Glori Bickers  Total critical care time: 35 minutes  Critical care time was exclusive of separately billable procedures and treating other patients.  Critical care was necessary to treat or prevent imminent or life-threatening deterioration.  Critical care was time spent personally by me (independent of midlevel providers or residents) on the following activities:  development of treatment plan with patient and/or surrogate as well as nursing, discussions with consultants, evaluation of patient's response to treatment, examination of patient, obtaining history from patient or surrogate, ordering and performing treatments and interventions, ordering and review of laboratory studies, ordering and review of radiographic studies, pulse oximetry and re-evaluation of patient's condition.    Length of Stay: 9   Glori Bickers, MD 07/01/2020, 5:22 PM  Advanced Heart Failure Team Pager [phone] (M-F; [place])  Please contact [facility] for night-coverage after hours (4p -7a ) and weekends on [email or web address]

## 2020-07-01 NOTE — Progress Notes (Signed)
NAME:  Carolyn Proctor, MRN:  [id number], DOB:  1961/03/02, LOS: 9 ADMISSION DATE:  06/22/2020, CONSULTATION DATE:  06/22/2020 REFERRING MD:  Dr. Tyrone Nine, CHIEF COMPLAINT:  AMS/ refractory hypoglycemia  Brief History   59 yoF at home who complained to family she felt like she was having a stroke, then developed left facial droop and went unresponsive.  Required BVM by EMS and noted to be hypoglycemic and hypotensive.  Intubated in ER for airway protection.  CTH negative.  Refractory hypoglycemia despite multiple D50 amps now on drip.  PCCM called to admit.   History of present illness   HPI obtained from medical chart review and daughter, [name] at beside, as patient is currently intubated and sedated on mechanical ventilation.  59 year old female with prior history of PAF, DVT, and PE on Eliquis, RA,  hypercoagulable state (increased protein S antigen, decreased protein C activity, and decreased Antithrombin III), HFpEF, CKD stage III, centrilobular emphysema and pulmonary fibrosis on daily prednisone- never smoker, chronic venous insufficiency w/ chronic foot ulcers, and diverticulitis.   Daughter reports patient has not felt well since her hospitalization in June at [facility] where she was diagnosed with PE/ DVT and placed on Eliquis in which daughter states she is compliant with.  Since, patient also has been struggling with weight loss, early satiety, muscle weakness, and ongoing lower leg edema/ wounds.  Seen by GI and put on miralax per daughter for constipation.  Daughter also reports her prednisone has been recently tapered.    Patient was at home this morning around 1020, when she reported to her family that she felt like she was having a stroke. Family noticed a left facial droop and then patient proceeded to become unresponsive.  On EMS arrival, patient required continuous BVM. CBG noted 49, afebrile and was hypotensive requiring fluid bolus.  On arrival to ER, she remained altered  and required intubation for airway protection. 2L NS bolus given for hypotension.  She remained hypoglycemic despite multiple D50 requiring D10 gtt.  A right IJ central line was placed in ER. Labs noted for normal WBC/ Hgb, Na 132, CO2 10, sCr 1.38, AG 19, albumin 2.2, normal AST/ ALT, t. Bili 2.9, trop hs 34, LA 10.8, ETOH neg, ammonia 157. CT head without acute process.  CXR showing satisfactory position of ETT, layering right pleural effusion, diffuse interstitial thickening c/w hx of fibrosis.  ABG post intubation 6.954/ 43.4/ 31.3/ 9.6.  Patient remained normotensive but temp 95 requiring bairhugger.  PCCM called for admit.   Past Medical History  PAF, DVT, PE on Eliquis HFpEF CKD stage III RA Hypercoagulable state (increased protein S antigen, decreased protein C activity, and decreased Antithrombin III) Centrilobular emphysema and pulmonary fibrosis- non smoker Chronic venous insufficiency w/ chronic foot ulcers  Diverticulitis   Significant Hospital Events   9/15 admitted/ intubated  Consults:   Procedures:  9/15 ETT >> 9/15 R IJ TL CVC >> 9/15 OGT/ foley  Significant Diagnostic Tests:  9/15 CTH >> negative  9/15 CXR >> ETT 5.3 above carina, layering right pleural effusion, diffuse interstitial thickening c/w hx of fibrosis  9/15 CTA chest/ abd/ pelvis >> significant dilatation of RA and RV consistent with R heart failure; moderate R and small L pleural effusions with associated atelectasis of both lower lobes; underlying chronic cystic and fibrotic lung disease; severe anasarca; nodular contour of liver suggestive of possible cirrhosis 9/15 CTA head and neck >> no significant carotid or vertebral artery stenosis in neck,  no LVO, right jugular central venous catheter with probable surrounding hematoma  9/16 CXR >> stable mild b/l pulmonary edema with small pleural effusions. No PTX noted  9/18 MR BRAIN>> no acute intracranial abnormality  9/20 CXR>> stable b/l pulm edema and  pleural effusions   Micro Data:  9/15 BCx2 central line >> NG5D 9/15 BCx2 PIV >> Strept mitis 9/15 SARS2 >> neg 9/15 UC >> ESBL E coli 9/16 Trach asp cult >> Normal flora   Antimicrobials:  9/15 vanc >> 9/17 9/15 zosyn >> 9/20  Interim history/subjective:  9/24: coox appropriate this am! Remains on milrinone at this time. Still not following commands, will cont with sbt despite this as tolerated. Ammonia slightly up to 41 today despite lactulose dosing yesterday 9/23: net negative 3.7L yesterday with >6L uop. Remains on milrinone and lasix gtt. Ammonia negative, still not following commands. ? If uremia at BUN 89 is playing role. Will send for cth. coox again 99... unsure where they are drawing the coox from in the early am as it appears to be the same issue. Recheck.  9/22: not following commands. Becomes htn-ive and tachycardic when sedation weaned. Remains on milrinone and lasix gtt. coox being repeated. ~3.6L uop yesterday  Objective   Blood pressure 98/60, pulse 63, temperature (!) 97.2 F (36.2 C), resp. rate (!) 28, height 5\' 11"  (1.803 m), weight 67.9 kg, last menstrual period 02/19/2013, SpO2 100 %. CVP:  [2 mmHg-10 mmHg] 2 mmHg  Vent Mode: PRVC FiO2 (%):  [40 %] 40 % Set Rate:  [28 bmp] 28 bmp Vt Set:  [500 mL] 500 mL PEEP:  [5 cmH20] 5 cmH20 Plateau Pressure:  [10 cmH20-29 cmH20] 27 cmH20   Intake/Output Summary (Last 24 hours) at 07/01/2020 0816 Last data filed at 07/01/2020 0600 Gross per 24 hour  Intake 1582.18 ml  Output 3250 ml  Net -1667.82 ml   Filed Weights   06/28/20 0500 06/29/20 0500 06/30/20 0500  Weight: 71.5 kg 70.1 kg 67.9 kg    Examination: General: ill-appearing adult female, intubated and mechanically ventilated, in NAD. HENT: mucous membranes pink and moist, ETT and OGT in place.  Eyes: PERRLA, an icteric eomi but not tracking CV: regular rate and irregular rhythm. 2/6 systolic murmur/TR noted. Pulm: mechanically ventilated breath sounds  bilaterally, cta GI: no guarding, soft, non-distended +BS. Neuro: not following commands again today. Only spontaneously moving head, opens eyes to verbal stim and blinks to threat but no tracking or purposeful movement.  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 RHC 2020 PAmean 35, PCWP 12, PVR 4.5 WU, preserved CO and CI. TTE this admission with profound RV failure with volume/pressure overload. She has signs of L heart failure with bilateral pleural effusions and pulmonary edema. UOP improved with increased lasix dosing, robust lat 24 hours >6L -- remains on  Milrinone for inotrope and RV afterload reduction per hf team, fortunately not shunting much through chronic lung disease with systemic therapy  -with 3.6L uop yesterday off lasix and metolazone -stop scheduled potassium at this time with cessation of lasix and last K 4.6.Marland KitchenMarland Kitchen await BMP  Acute hypoxemic respiratory failure: ILD, volume overload. --PSV during day, work towards SBT --  prednisone taper   Acute on Chronic liver failure: -cont lactulose, ammonia 41 this am.  -noted nodular appearance on imaging.   Acute encephalopathy of undetermined etiology:  -likely multifactorial with metabolic issues, and recent infection -ammonia up: on lactulose -BUN is elevated, awaiting repeat this am.  -remains off sedation -cth today.  -change sedation to precedex once cth complete to wean off propofol.  -minimize sedation as able.   ESBL E. Coli UTI: --WBC up a bit from 20->22 -remains afebrile -- Zosyn complete 06/27/20  AKI: due to venous congestion and low CO. CO hopefully improved now feels warm. Cr seems to be plateauing  but BUN is continuing to rise  --follow indices -follow uop -? If BUN contributing to mental status.   Best practice:  Diet: NPO, TF Pain/Anxiety/Delirium protocol (if indicated): fentanyl, propofol VAP protocol (if indicated): yes DVT prophylaxis: Heparin per pharmacy GI prophylaxis: PPI Glucose control: insulin Mobility: bedrest Code Status: full  Family Communication: Aunt Ms. Jimmye Norman, updated at bedside.  Disposition: ICU  Labs   CBC: Recent Labs  Lab 06/27/20 0544 06/28/20 0629 06/29/20 0511 06/30/20 0615 07/01/20 0430  WBC 17.6* 22.4* 20.8* 20.2* 22.0*  HGB 11.5* 12.6 12.1 14.5 13.3  HCT 35.2* 38.6 38.3 44.6 42.2  MCV 99.2 98.0 99.2 98.0 100.2*  PLT 240 306 314 391 527    Basic Metabolic Panel: Recent Labs  Lab 06/24/20 1555 06/25/20 0805 06/27/20 0544 06/27/20 1130 06/28/20 0629 06/28/20 1724 06/29/20 0511 06/30/20 0615  NA 137   < > 138  --  139 138 139 136  K 3.8   < > 3.2*  --  3.0* 3.5 3.7 4.6  CL 100   < > 102  --  100 98 100 94*  CO2 23   < > 23  --  24 22 24 26   GLUCOSE 167*   < > 195*  --  143* 279* 258* 255*  BUN 25*   < > 55*  --  65* 73* 76* 89*  CREATININE 1.57*   < > 2.21*  --  2.28* 2.44* 2.41* 2.16*  CALCIUM 7.6*   < > 8.6*  --  8.9 8.7* 8.8* 8.9  MG 2.7*  --   --  2.3  --   --   --  2.1  PHOS 3.1  --   --  3.3  --   --   --   --    < > = values in this interval not displayed.   GFR: Estimated Creatinine Clearance: 30.8 mL/min (A) (by C-G formula based on SCr of 2.16 mg/dL (H)). Recent Labs  Lab 06/24/20 1552 06/25/20 0415 06/28/20 0629 06/29/20 0511 06/30/20 0615 07/01/20 0430  WBC  --    < > 22.4* 20.8* 20.2* 22.0*  LATICACIDVEN 3.1*  --   --   --   --   --    < > = values in this interval not displayed.    Liver Function Tests: Recent Labs  Lab 06/26/20 0829 06/27/20 0544 06/28/20 0629 06/29/20 0511 06/30/20 0615  AST 22 24 25 23 24   ALT 20 19 23 23 24   ALKPHOS 123 116 127* 110 124  BILITOT 1.3* 1.4* 1.2 1.1  1.3*  PROT 6.6 6.4* 6.5 6.1* 6.9  ALBUMIN 2.0* 1.8* 2.0* 2.0* 2.1*   No results for input(s): LIPASE, AMYLASE in the last 168 hours. Recent Labs  Lab 06/27/20 1130 06/29/20 1319 06/30/20 0957  AMMONIA 18 31 41*    ABG  Component Value Date/Time   PHART 7.271 (L) 06/23/2020 1316   PCO2ART 33.0 06/23/2020 1316   PO2ART 357 (H) 06/23/2020 1316   HCO3 15.2 (L) 06/23/2020 1316   TCO2 16 (L) 06/23/2020 1316   ACIDBASEDEF 11.0 (H) 06/23/2020 1316   O2SAT 76.5 07/01/2020 0430     Coagulation Profile: No results for input(s): INR, PROTIME in the last 168 hours.  Cardiac Enzymes: No results for input(s): CKTOTAL, CKMB, CKMBINDEX, TROPONINI in the last 168 hours.  HbA1C: Hgb A1c MFr Bld  Date/Time Value Ref Range Status  06/23/2020 05:34 AM 5.5 4.8 - 5.6 % Final    Comment:    (NOTE) Pre diabetes:          5.7%-6.4%  Diabetes:              >6.4%  Glycemic control for   <7.0% adults with diabetes   07/17/2007 02:46 PM   Final   5.3 (NOTE)   The ADA recommends the following therapeutic goals for glycemic   control related to Hgb A1C measurement:   Goal of Therapy:   < 7.0% Hgb A1C   Action Suggested:  > 8.0% Hgb A1C   Ref:  Diabetes Care, 22, Suppl. 1, 1999    CBG: Recent Labs  Lab 06/30/20 1558 06/30/20 1946 06/30/20 2327 07/01/20 0356 07/01/20 0742  GLUCAP 145* 131* 161* 126* 137*     CRITICAL CARE Performed by: Audria Nine   Total critical care time: 37 minutes  Critical care time was exclusive of separately billable procedures and treating other patients.  Critical care was necessary to treat or prevent imminent or life-threatening deterioration.  Critical care was time spent personally by me on the following activities: development of treatment plan with patient and/or surrogate as well as nursing, discussions with consultants, evaluation of patient's response to treatment, examination of patient, obtaining history from patient or surrogate,  ordering and performing treatments and interventions, ordering and review of laboratory studies, ordering and review of radiographic studies, pulse oximetry and re-evaluation of patient's condition.

## 2020-07-01 NOTE — Progress Notes (Signed)
Patient transported to CT and back without complications. RN at bedside.  

## 2020-07-02 DIAGNOSIS — E43 Unspecified severe protein-calorie malnutrition: Secondary | ICD-10-CM

## 2020-07-02 LAB — CBC
HCT: 44.1 % (ref 36.0–46.0)
Hemoglobin: 14.2 g/dL (ref 12.0–15.0)
MCH: 32.5 pg (ref 26.0–34.0)
MCHC: 32.2 g/dL (ref 30.0–36.0)
MCV: 100.9 fL — ABNORMAL HIGH (ref 80.0–100.0)
Platelets: 399 10*3/uL (ref 150–400)
RBC: 4.37 MIL/uL (ref 3.87–5.11)
RDW: 19.9 % — ABNORMAL HIGH (ref 11.5–15.5)
WBC: 18.8 10*3/uL — ABNORMAL HIGH (ref 4.0–10.5)
nRBC: 0.1 % (ref 0.0–0.2)

## 2020-07-02 LAB — POCT I-STAT 7, (LYTES, BLD GAS, ICA,H+H)
Acid-Base Excess: 5 mmol/L — ABNORMAL HIGH (ref 0.0–2.0)
Bicarbonate: 28.7 mmol/L — ABNORMAL HIGH (ref 20.0–28.0)
Calcium, Ion: 1.25 mmol/L (ref 1.15–1.40)
HCT: 47 % — ABNORMAL HIGH (ref 36.0–46.0)
Hemoglobin: 16 g/dL — ABNORMAL HIGH (ref 12.0–15.0)
O2 Saturation: 99 %
Potassium: 4.7 mmol/L (ref 3.5–5.1)
Sodium: 142 mmol/L (ref 135–145)
TCO2: 30 mmol/L (ref 22–32)
pCO2 arterial: 36.6 mmHg (ref 32.0–48.0)
pH, Arterial: 7.502 — ABNORMAL HIGH (ref 7.350–7.450)
pO2, Arterial: 139 mmHg — ABNORMAL HIGH (ref 83.0–108.0)

## 2020-07-02 LAB — COOXEMETRY PANEL
Carboxyhemoglobin: 2.1 % — ABNORMAL HIGH (ref 0.5–1.5)
Methemoglobin: 1 % (ref 0.0–1.5)
O2 Saturation: 99.8 %
Total hemoglobin: 14.4 g/dL (ref 12.0–16.0)

## 2020-07-02 LAB — COMPREHENSIVE METABOLIC PANEL
ALT: 26 U/L (ref 0–44)
AST: 24 U/L (ref 15–41)
Albumin: 2.1 g/dL — ABNORMAL LOW (ref 3.5–5.0)
Alkaline Phosphatase: 111 U/L (ref 38–126)
Anion gap: 14 (ref 5–15)
BUN: 93 mg/dL — ABNORMAL HIGH (ref 6–20)
CO2: 25 mmol/L (ref 22–32)
Calcium: 8.8 mg/dL — ABNORMAL LOW (ref 8.9–10.3)
Chloride: 102 mmol/L (ref 98–111)
Creatinine, Ser: 1.73 mg/dL — ABNORMAL HIGH (ref 0.44–1.00)
GFR calc Af Amer: 37 mL/min — ABNORMAL LOW (ref >60)
GFR calc non Af Amer: 32 mL/min — ABNORMAL LOW (ref >60)
Glucose, Bld: 133 mg/dL — ABNORMAL HIGH (ref 70–99)
Potassium: 3.7 mmol/L (ref 3.5–5.1)
Sodium: 141 mmol/L (ref 135–145)
Total Bilirubin: 1.5 mg/dL — ABNORMAL HIGH (ref 0.3–1.2)
Total Protein: 6.1 g/dL — ABNORMAL LOW (ref 6.5–8.1)

## 2020-07-02 LAB — GLUCOSE, CAPILLARY
Glucose-Capillary: 129 mg/dL — ABNORMAL HIGH (ref 70–99)
Glucose-Capillary: 105 mg/dL — ABNORMAL HIGH (ref 70–99)
Glucose-Capillary: 119 mg/dL — ABNORMAL HIGH (ref 70–99)
Glucose-Capillary: 92 mg/dL (ref 70–99)
Glucose-Capillary: 106 mg/dL — ABNORMAL HIGH (ref 70–99)

## 2020-07-02 LAB — HEPARIN LEVEL (UNFRACTIONATED): Heparin Unfractionated: 0.26 IU/mL — ABNORMAL LOW (ref 0.30–0.70)

## 2020-07-02 MED ORDER — POTASSIUM CHLORIDE 20 MEQ PO PACK
40.0000 meq | PACK | Freq: Once | ORAL | Status: AC
  Administered 2020-07-02: 40 meq
  Filled 2020-07-02: qty 2

## 2020-07-02 MED ORDER — ACETAMINOPHEN 160 MG/5ML PO SOLN
650.0000 mg | Freq: Four times a day (QID) | ORAL | Status: DC | PRN
  Administered 2020-07-02 – 2020-07-03 (×4): 650 mg
  Filled 2020-07-02 (×4): qty 20.3

## 2020-07-02 MED ORDER — SODIUM CHLORIDE 0.9 % IV SOLN
INTRAVENOUS | Status: DC | PRN
  Administered 2020-07-17: 10 mL via INTRA_ARTERIAL

## 2020-07-02 NOTE — Progress Notes (Signed)
NAME:  Carolyn Proctor, MRN:  [id number], DOB:  [birth year], LOS: 30 ADMISSION DATE:  06/22/2020, CONSULTATION DATE:  06/22/2020 REFERRING MD:  Dr. Tyrone Nine, CHIEF COMPLAINT:  AMS/ refractory hypoglycemia  Brief History   59 yoF at home who complained to family she felt like she was having a stroke, then developed left facial droop and went unresponsive.  Required BVM by EMS and noted to be hypoglycemic and hypotensive.  Intubated in ER for airway protection.  CTH negative.  Refractory hypoglycemia despite multiple D50 amps now on drip.  PCCM called to admit.   History of present illness   HPI obtained from medical chart review and daughter, [name] at beside, as patient is currently intubated and sedated on mechanical ventilation.  59 year old female with prior history of PAF, DVT, and PE on Eliquis, RA,  hypercoagulable state (increased protein S antigen, decreased protein C activity, and decreased Antithrombin III), HFpEF, CKD stage III, centrilobular emphysema and pulmonary fibrosis on daily prednisone- never smoker, chronic venous insufficiency w/ chronic foot ulcers, and diverticulitis.   Daughter reports patient has not felt well since her hospitalization in June at [facility] where she was diagnosed with PE/ DVT and placed on Eliquis in which daughter states she is compliant with.  Since, patient also has been struggling with weight loss, early satiety, muscle weakness, and ongoing lower leg edema/ wounds.  Seen by GI and put on miralax per daughter for constipation.  Daughter also reports her prednisone has been recently tapered.    Patient was at home this morning around 1020, when she reported to her family that she felt like she was having a stroke. Family noticed a left facial droop and then patient proceeded to become unresponsive.  On EMS arrival, patient required continuous BVM. CBG noted 49, afebrile and was hypotensive requiring fluid bolus.  On arrival to ER, she remained altered  and required intubation for airway protection. 2L NS bolus given for hypotension.  She remained hypoglycemic despite multiple D50 requiring D10 gtt.  A right IJ central line was placed in ER. Labs noted for normal WBC/ Hgb, Na 132, CO2 10, sCr 1.38, AG 19, albumin 2.2, normal AST/ ALT, t. Bili 2.9, trop hs 34, LA 10.8, ETOH neg, ammonia 157. CT head without acute process.  CXR showing satisfactory position of ETT, layering right pleural effusion, diffuse interstitial thickening c/w hx of fibrosis.  ABG post intubation 6.954/ 43.4/ 31.3/ 9.6.  Patient remained normotensive but temp 95 requiring bairhugger.  PCCM called for admit.   Past Medical History  PAF, DVT, PE on Eliquis HFpEF CKD stage III RA Hypercoagulable state (increased protein S antigen, decreased protein C activity, and decreased Antithrombin III) Centrilobular emphysema and pulmonary fibrosis- non smoker Chronic venous insufficiency w/ chronic foot ulcers  Diverticulitis   Significant Hospital Events   9/15 admitted/ intubated 9/25-on full vent support, encephalopathy precluding extubation  Consults:   Procedures:  9/15 ETT >> 9/15 R IJ TL CVC >> 9/15 OGT/ foley  Significant Diagnostic Tests:  9/15 CTH >> negative  9/15 CXR >> ETT 5.3 above carina, layering right pleural effusion, diffuse interstitial thickening c/w hx of fibrosis  9/15 CTA chest/ abd/ pelvis >> significant dilatation of RA and RV consistent with R heart failure; moderate R and small L pleural effusions with associated atelectasis of both lower lobes; underlying chronic cystic and fibrotic lung disease; severe anasarca; nodular contour of liver suggestive of possible cirrhosis 9/15 CTA head and neck >> no significant  carotid or vertebral artery stenosis in neck, no LVO, right jugular central venous catheter with probable surrounding hematoma  9/16 CXR >> stable mild b/l pulmonary edema with small pleural effusions. No PTX noted  9/18 MR BRAIN>> no acute  intracranial abnormality  9/20 CXR>> stable b/l pulm edema and pleural effusions   Micro Data:  9/15 BCx2 central line >> NG5D 9/15 BCx2 PIV >> Strept mitis 9/15 SARS2 >> neg 9/15 UC >> ESBL E coli 9/16 Trach asp cult >> Normal flora  Antimicrobials:  9/15 vanc >> 9/17 9/15 zosyn >> 9/20  Interim history/subjective:  9/25: Lethargic, was able to follow nurses instructions this morning, coming down on dose of fentanyl, coming down on dose of Precedex 9/24: coox appropriate this am! Remains on milrinone at this time. Still not following commands, will cont with sbt despite this as tolerated. Ammonia slightly up to 41 today despite lactulose dosing yesterday 9/23: net negative 3.7L yesterday with >6L uop. Remains on milrinone and lasix gtt. Ammonia negative, still not following commands. ? If uremia at BUN 89 is playing role. Will send for cth. coox again 99... unsure where they are drawing the coox from in the early am as it appears to be the same issue. Recheck.  9/22: not following commands. Becomes htn-ive and tachycardic when sedation weaned. Remains on milrinone and lasix gtt. coox being repeated. ~3.6L uop yesterday  Objective   Blood pressure 136/75, pulse 62, temperature 100.2 F (37.9 C), temperature source Bladder, resp. rate (!) 28, height 5\' 11"  (1.803 m), weight 68.2 kg, last menstrual period 02/19/2013, SpO2 99 %. CVP:  [4 mmHg-10 mmHg] 4 mmHg  Vent Mode: PRVC FiO2 (%):  [40 %] 40 % Set Rate:  [28 bmp] 28 bmp Vt Set:  [500 mL] 500 mL PEEP:  [5 cmH20] 5 cmH20 Plateau Pressure:  [20 cmH20] 20 cmH20   Intake/Output Summary (Last 24 hours) at 07/02/2020 7867 Last data filed at 07/02/2020 0800 Gross per 24 hour  Intake 2036.82 ml  Output 1755 ml  Net 281.82 ml   Filed Weights   06/29/20 0500 06/30/20 0500 07/02/20 0442  Weight: 70.1 kg 67.9 kg 68.2 kg    Examination: General: Chronically ill-appearing, temporal muscle wasting, on vent  HENT: Moist oral mucosa,  endotracheal tube in place Eyes: Pale, pupils reactive CV: S1-S2 appreciated, systolic murmur Pulm: Clear to auscultation anteriorly  GI: Bowel sounds appreciated Neuro: Was able to follow commands this morning Extremities: 1+ bilateral edema   Resolved Hospital Problem list    Assessment & Plan:  Cardiogenic shock -Severe biventricular failure/cor pulmonale -Continues on milrinone -Volume status is better -Stable renal function -Off Lasix  Encephalopathy -Etiology remains unclear -On lactulose for hyperammonemia -CT head unrevealing -Brain MRI on 9/18 unrevealing -BUN/creatinine is stable-no prerenal picture -Minimize sedation as possible -Weaning off fentanyl and Precedex  Acute hypoxic respiratory failure -SBT as tolerated -Prednisone taper  Acute on chronic liver failure -Continue lactulose -Monitor ammonia -This may be contributing to encephalopathy  ESBL E. coli UTI -Completed Zosyn -Leukocytosis continues to improve  AKI -Related to hypoperfusion -Continue to trend -Avoid nephrotoxic's  Patient remains acutely ill Discussed with daughter at bedside Obtain ABG  Best practice:  Diet: Tube feeds Pain/Anxiety/Delirium protocol (if indicated): Fentanyl, Precedex VAP protocol (if indicated): yes DVT prophylaxis: Continue heparin GI prophylaxis: PPI Glucose control: insulin Mobility: bedrest Code Status: full  Family Communication: Discussed with daughter at bedside Disposition: ICU  Labs   CBC: Recent Labs  Lab 06/28/20 [phone] 06/29/20  7353 06/30/20 0615 07/01/20 0430 07/02/20 0452  WBC 22.4* 20.8* 20.2* 22.0* 18.8*  HGB 12.6 12.1 14.5 13.3 14.2  HCT 38.6 38.3 44.6 42.2 44.1  MCV 98.0 99.2 98.0 100.2* 100.9*  PLT 306 314 391 386 299    Basic Metabolic Panel: Recent Labs  Lab 06/27/20 1130 06/28/20 0629 06/28/20 1724 06/29/20 0511 06/30/20 0615 07/01/20 0843 07/02/20 0452  NA  --    < > 138 139 136 141 141  K  --    < > 3.5 3.7  4.6 4.0 3.7  CL  --    < > 98 100 94* 101 102  CO2  --    < > 22 24 26 27 25   GLUCOSE  --    < > 279* 258* 255* 143* 133*  BUN  --    < > 73* 76* 89* 95* 93*  CREATININE  --    < > 2.44* 2.41* 2.16* 2.07* 1.73*  CALCIUM  --    < > 8.7* 8.8* 8.9 8.9 8.8*  MG 2.3  --   --   --  2.1  --   --   PHOS 3.3  --   --   --   --   --   --    < > = values in this interval not displayed.   GFR: Estimated Creatinine Clearance: 38.6 mL/min (A) (by C-G formula based on SCr of 1.73 mg/dL (H)). Recent Labs  Lab 06/29/20 0511 06/30/20 0615 07/01/20 0430 07/02/20 0452  WBC 20.8* 20.2* 22.0* 18.8*    Liver Function Tests: Recent Labs  Lab 06/28/20 0629 06/29/20 0511 06/30/20 0615 07/01/20 0843 07/02/20 0452  AST 25 23 24 29 24   ALT 23 23 24 27 26   ALKPHOS 127* 110 124 121 111  BILITOT 1.2 1.1 1.3* 1.3* 1.5*  PROT 6.5 6.1* 6.9 6.3* 6.1*  ALBUMIN 2.0* 2.0* 2.1* 2.1* 2.1*   No results for input(s): LIPASE, AMYLASE in the last 168 hours. Recent Labs  Lab 06/27/20 1130 06/29/20 1319 06/30/20 0957  AMMONIA 18 31 41*    ABG    Component Value Date/Time   PHART 7.271 (L) 06/23/2020 1316   PCO2ART 33.0 06/23/2020 1316   PO2ART 357 (H) 06/23/2020 1316   HCO3 15.2 (L) 06/23/2020 1316   TCO2 16 (L) 06/23/2020 1316   ACIDBASEDEF 11.0 (H) 06/23/2020 1316   O2SAT 99.8 07/02/2020 0458     Coagulation Profile: No results for input(s): INR, PROTIME in the last 168 hours.  Cardiac Enzymes: No results for input(s): CKTOTAL, CKMB, CKMBINDEX, TROPONINI in the last 168 hours.  HbA1C: Hgb A1c MFr Bld  Date/Time Value Ref Range Status  06/23/2020 05:34 AM 5.5 4.8 - 5.6 % Final    Comment:    (NOTE) Pre diabetes:          5.7%-6.4%  Diabetes:              >6.4%  Glycemic control for   <7.0% adults with diabetes   07/17/2007 02:46 PM   Final   5.3 (NOTE)   The ADA recommends the following therapeutic goals for glycemic   control related to Hgb A1C measurement:   Goal of Therapy:   <  7.0% Hgb A1C   Action Suggested:  > 8.0% Hgb A1C   Ref:  Diabetes Care, 22, Suppl. 1, 1999    CBG: Recent Labs  Lab 07/01/20 1629 07/01/20 1949 07/01/20 2341 07/02/20 0332 07/02/20 0827  GLUCAP 244* 221* 220*  129* 105*    The patient is critically ill with multiple organ systems failure and requires high complexity decision making for assessment and support, frequent evaluation and titration of therapies, application of advanced monitoring technologies and extensive interpretation of multiple databases. Critical Care Time devoted to patient care services described in this note independent of APP/resident time (if applicable)  is 32 minutes.   Sherrilyn Rist MD [facility] Personal pager: [phone] If unanswered, please page CCM On-call: [phone]

## 2020-07-02 NOTE — Progress Notes (Signed)
Rt unable to place arterial line. Dr. Ander Slade notified and will attempt to place line this afternoon. ABG drawn and results given to Dr. Ander Slade. Verbal order received to decrease RR to 20. RN notified of changes. RT will continue to monitor.

## 2020-07-02 NOTE — Procedures (Signed)
Arterial Catheter Insertion Procedure Note  SARAHANN HORRELL  [id number]  [birth year]  Date:07/02/20  Time:1:56 PM    Provider Performing: Laurin Coder    Procedure: Insertion of Arterial Line [phone]) with US guidance ([id number])   Indication(s) Blood pressure monitoring and/or need for frequent ABGs  Consent Unable to obtain consent due to emergent nature of procedure.  Anesthesia None   Time Out Verified patient identification, verified procedure, site/side was marked, verified correct patient position, special equipment/implants available, medications/allergies/relevant history reviewed, required imaging and test results available.   Sterile Technique Maximal sterile technique including full sterile barrier drape, hand hygiene, sterile gown, sterile gloves, mask, hair covering, sterile ultrasound probe cover (if used).   Procedure Description Area of catheter insertion was cleaned with chlorhexidine and draped in sterile fashion. With real-time ultrasound guidance an arterial catheter was placed into the right radial artery.  Appropriate arterial tracings confirmed on monitor.     Complications/Tolerance None; patient tolerated the procedure well.   EBL Minimal   Specimen(s) None

## 2020-07-02 NOTE — Progress Notes (Signed)
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), Afib, protein C deficiency  No Known Allergies  Patient Measurements: Height: 5\' 11"  (180.3 cm) Weight: 68.2 kg (150 lb 5.7 oz) IBW/kg (Calculated) : 70.8 Heparin Dosing Weight: 74.2 kg  Vital Signs: Temp: 100 F (37.8 C) (09/25 0600) Temp Source: Oral (09/25 0400) BP: 119/70 (09/25 0600) Pulse Rate: 62 (09/25 0600)  Labs: Recent Labs    06/30/20 0615 06/30/20 0615 07/01/20 0430 07/01/20 0843 07/02/20 0452  HGB 14.5   < > 13.3  --  14.2  HCT 44.6  --  42.2  --  44.1  PLT 391  --  386  --  399  HEPARINUNFRC 0.35  --  0.45  --  0.26*  CREATININE 2.16*  --   --  2.07* 1.73*   < > = values in this interval not displayed.    Estimated Creatinine Clearance: 38.6 mL/min (A) (by C-G formula based on SCr of 1.73 mg/dL (H)).  Assessment: 14 yof on Apixaban PTA for recent hx DVT/PE in June 2021, also with hx Afib with the last dose 9/14 PM. Now on hold and continuing on heparin for bridging. Will follow heparin levels only now with apixaban no longer influencing heparin levels.  Heparin level sub-therapeutic this AM at 0.26 units/mL.  No issue with heparin infusion nor bleeding per discussion with RN.  Goal of Therapy:  Heparin level 0.3-0.7 units/ml Monitor platelets by anticoagulation protocol: Yes   Plan:  Increase heparin gtt to 1200 units/hr Daily heparin level and CBC  Ciaran Begay D. Mina Marble, PharmD, BCPS, [place] 07/02/2020, 7:18 AM

## 2020-07-03 ENCOUNTER — Inpatient Hospital Stay (HOSPITAL_COMMUNITY): Payer: BC Managed Care – PPO

## 2020-07-03 ENCOUNTER — Other Ambulatory Visit (HOSPITAL_COMMUNITY): Payer: BC Managed Care – PPO

## 2020-07-03 LAB — GLUCOSE, CAPILLARY
Glucose-Capillary: 203 mg/dL — ABNORMAL HIGH (ref 70–99)
Glucose-Capillary: 100 mg/dL — ABNORMAL HIGH (ref 70–99)
Glucose-Capillary: 249 mg/dL — ABNORMAL HIGH (ref 70–99)
Glucose-Capillary: 298 mg/dL — ABNORMAL HIGH (ref 70–99)
Glucose-Capillary: 245 mg/dL — ABNORMAL HIGH (ref 70–99)
Glucose-Capillary: 217 mg/dL — ABNORMAL HIGH (ref 70–99)
Glucose-Capillary: 163 mg/dL — ABNORMAL HIGH (ref 70–99)

## 2020-07-03 LAB — COMPREHENSIVE METABOLIC PANEL
ALT: 33 U/L (ref 0–44)
AST: 24 U/L (ref 15–41)
Albumin: 2 g/dL — ABNORMAL LOW (ref 3.5–5.0)
Alkaline Phosphatase: 110 U/L (ref 38–126)
Anion gap: 13 (ref 5–15)
BUN: 81 mg/dL — ABNORMAL HIGH (ref 6–20)
CO2: 24 mmol/L (ref 22–32)
Calcium: 8.9 mg/dL (ref 8.9–10.3)
Chloride: 105 mmol/L (ref 98–111)
Creatinine, Ser: 1.44 mg/dL — ABNORMAL HIGH (ref 0.44–1.00)
GFR calc Af Amer: 47 mL/min — ABNORMAL LOW (ref >60)
GFR calc non Af Amer: 40 mL/min — ABNORMAL LOW (ref >60)
Glucose, Bld: 264 mg/dL — ABNORMAL HIGH (ref 70–99)
Potassium: 4.1 mmol/L (ref 3.5–5.1)
Sodium: 142 mmol/L (ref 135–145)
Total Bilirubin: 1.9 mg/dL — ABNORMAL HIGH (ref 0.3–1.2)
Total Protein: 6.3 g/dL — ABNORMAL LOW (ref 6.5–8.1)

## 2020-07-03 LAB — URINALYSIS, ROUTINE W REFLEX MICROSCOPIC
Bilirubin Urine: NEGATIVE
Glucose, UA: NEGATIVE mg/dL
Ketones, ur: NEGATIVE mg/dL
Nitrite: NEGATIVE
Protein, ur: 100 mg/dL — AB
RBC / HPF: >50 RBC/hpf — ABNORMAL HIGH (ref 0–5)
Specific Gravity, Urine: 1.021 (ref 1.005–1.030)
WBC, UA: >50 WBC/hpf — ABNORMAL HIGH (ref 0–5)
pH: 5 (ref 5.0–8.0)

## 2020-07-03 LAB — CBC
HCT: 45.1 % (ref 36.0–46.0)
Hemoglobin: 13.8 g/dL (ref 12.0–15.0)
MCH: 31.9 pg (ref 26.0–34.0)
MCHC: 30.6 g/dL (ref 30.0–36.0)
MCV: 104.4 fL — ABNORMAL HIGH (ref 80.0–100.0)
Platelets: 405 10*3/uL — ABNORMAL HIGH (ref 150–400)
RBC: 4.32 MIL/uL (ref 3.87–5.11)
RDW: 19.5 % — ABNORMAL HIGH (ref 11.5–15.5)
WBC: 26.5 10*3/uL — ABNORMAL HIGH (ref 4.0–10.5)
nRBC: 0 % (ref 0.0–0.2)

## 2020-07-03 LAB — POCT I-STAT 7, (LYTES, BLD GAS, ICA,H+H)
Acid-Base Excess: 2 mmol/L (ref 0.0–2.0)
Bicarbonate: 25.1 mmol/L (ref 20.0–28.0)
Calcium, Ion: 1.22 mmol/L (ref 1.15–1.40)
HCT: 44 % (ref 36.0–46.0)
Hemoglobin: 15 g/dL (ref 12.0–15.0)
O2 Saturation: 99 %
Potassium: 4 mmol/L (ref 3.5–5.1)
Sodium: 143 mmol/L (ref 135–145)
TCO2: 26 mmol/L (ref 22–32)
pCO2 arterial: 33.2 mmHg (ref 32.0–48.0)
pH, Arterial: 7.485 — ABNORMAL HIGH (ref 7.350–7.450)
pO2, Arterial: 116 mmHg — ABNORMAL HIGH (ref 83.0–108.0)

## 2020-07-03 LAB — COOXEMETRY PANEL
Carboxyhemoglobin: 2.5 % — ABNORMAL HIGH (ref 0.5–1.5)
Carboxyhemoglobin: 2.3 % — ABNORMAL HIGH (ref 0.5–1.5)
Methemoglobin: 1 % (ref 0.0–1.5)
Methemoglobin: 0.8 % (ref 0.0–1.5)
O2 Saturation: 98.9 %
O2 Saturation: 93.9 %
Total hemoglobin: 14.1 g/dL (ref 12.0–16.0)
Total hemoglobin: 13.6 g/dL (ref 12.0–16.0)

## 2020-07-03 LAB — TSH: TSH: 2.384 u[IU]/mL (ref 0.350–4.500)

## 2020-07-03 LAB — HEPARIN LEVEL (UNFRACTIONATED)
Heparin Unfractionated: 0.26 IU/mL — ABNORMAL LOW (ref 0.30–0.70)
Heparin Unfractionated: 0.3 IU/mL (ref 0.30–0.70)

## 2020-07-03 LAB — CK: Total CK: 22 U/L — ABNORMAL LOW (ref 38–234)

## 2020-07-03 LAB — LACTIC ACID, PLASMA: Lactic Acid, Venous: 2.8 mmol/L (ref 0.5–1.9)

## 2020-07-03 LAB — T4, FREE: Free T4: 0.82 ng/dL (ref 0.61–1.12)

## 2020-07-03 LAB — LACTATE DEHYDROGENASE: LDH: 254 U/L — ABNORMAL HIGH (ref 98–192)

## 2020-07-03 LAB — TRIGLYCERIDES: Triglycerides: 59 mg/dL (ref <150)

## 2020-07-03 LAB — IRON AND TIBC
Iron: 32 ug/dL (ref 28–170)
Saturation Ratios: 12 % (ref 10.4–31.8)
TIBC: 262 ug/dL (ref 250–450)
UIBC: 230 ug/dL

## 2020-07-03 LAB — AMMONIA: Ammonia: 43 umol/L — ABNORMAL HIGH (ref 9–35)

## 2020-07-03 MED ORDER — SODIUM CHLORIDE 0.9 % IV SOLN: INTRAVENOUS | Status: AC

## 2020-07-03 MED ORDER — DEXTROSE 50 % IV SOLN: 50.0000 mL | Freq: Once | INTRAVENOUS | Status: DC

## 2020-07-03 NOTE — Progress Notes (Signed)
Bladder temp 99 Rectal temp 98.6 Cooling blanket switched to monitor mode at this time.  Patient more alert and following commands.

## 2020-07-03 NOTE — Progress Notes (Signed)
Lactate noted to be elevated  Give 250 of saline  Have to be cautious with fluid resuscitation with her heart failure

## 2020-07-03 NOTE — Progress Notes (Signed)
Patient with axillary temp 102.4. Bladder probe temp reading 104.9 Patient diaphoretic, core is warm to touch, extremities X4 ice cold. Left hand mottled. Palpable pulses X4.  PRN tylenol given, ice packs applied to core. CCM at bedside. VSS.

## 2020-07-03 NOTE — Progress Notes (Signed)
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), Afib, protein C deficiency  No Known Allergies  Patient Measurements: Height: 5\' 11"  (180.3 cm) Weight: 65.7 kg (144 lb 13.5 oz) IBW/kg (Calculated) : 70.8 Heparin Dosing Weight: 74.2 kg  Vital Signs: Temp: 104.9 F (40.5 C) (09/26 0801) Temp Source: Bladder (09/26 0801) BP: 112/63 (09/26 0801) Pulse Rate: 90 (09/26 0801)  Labs: Recent Labs    07/01/20 0430 07/01/20 0430 07/01/20 0843 07/02/20 0452 07/02/20 0452 07/02/20 1200 07/03/20 0401  HGB 13.3   < >  --  14.2   < > 16.0* 13.8  HCT 42.2   < >  --  44.1  --  47.0* 45.1  PLT 386  --   --  399  --   --  405*  HEPARINUNFRC 0.45  --   --  0.26*  --   --  0.26*  CREATININE  --   --  2.07* 1.73*  --   --  1.44*   < > = values in this interval not displayed.    Estimated Creatinine Clearance: 44.7 mL/min (A) (by C-G formula based on SCr of 1.44 mg/dL (H)).  Assessment: 18 yof on Apixaban PTA for recent hx DVT/PE in June 2021, also with hx Afib with the last dose 9/14 PM. Now on hold and continuing on heparin for bridging. Will follow heparin levels only now with apixaban no longer influencing heparin levels.  Heparin level remains sub-therapeutic this AM at 0.26 units/mL despite rate increase yesterday.  No issue with heparin infusion nor bleeding per discussion with RN.  Patient's CBGs (fingerstick vs A-line) and temperature (core vs periphery) are not correlating.  Unsure how accurate heparin level is given that she was previously therapeutic x6 days on heparin at 1100 units/hr >> stat repeat heparin level drawn from A-line is therapeutic at 0.3 units/mL.    Goal of Therapy:  Heparin level 0.3-0.7 units/ml Monitor platelets by anticoagulation protocol: Yes   Plan:  Increase heparin gtt slightly to 1250 units/hr Daily heparin level and CBC  Carolyn Proctor D. Carolyn Proctor, PharmD, BCPS, [place] 07/03/2020, 10:37 AM

## 2020-07-03 NOTE — Progress Notes (Signed)
Hypoglycemic Event  CBG: 41   Treatment: D50 amp   Symptoms: None   Follow-up CBG: Time 0220 CBG Result:  Possible Reasons for Event: Paused for extended time from previous bath.   Comments/MD notified: Elink RN notified.     Margaret Pyle

## 2020-07-03 NOTE — Progress Notes (Addendum)
EEG complete - results pending 

## 2020-07-03 NOTE — Progress Notes (Signed)
Tech retook blood sugar again but using arterial blood. CBG was in the 200's.  Spoke with Elink RN and he said to stick with arterial blood b/c she may be fluid overloaded and therefore diluted capillary blood. D50 not given, another CBG will be taken at 0330

## 2020-07-03 NOTE — Progress Notes (Addendum)
NAME:  Carolyn Proctor, MRN:  [id number], DOB:  1960-12-05, LOS: 11 ADMISSION DATE:  06/22/2020, CONSULTATION DATE:  06/22/2020 REFERRING MD:  Dr. Tyrone Nine, CHIEF COMPLAINT:  AMS/ refractory hypoglycemia  Brief History   27 yoF at home who complained to family she felt like she was having a stroke, then developed left facial droop and went unresponsive.  Required BVM by EMS and noted to be hypoglycemic and hypotensive.  Intubated in ER for airway protection.  CTH negative.  Refractory hypoglycemia despite multiple D50 amps now on drip.  PCCM called to admit.   History of present illness   HPI obtained from medical chart review and daughter, [name] at beside, as patient is currently intubated and sedated on mechanical ventilation.  59 year old female with prior history of PAF, DVT, and PE on Eliquis, RA,  hypercoagulable state (increased protein S antigen, decreased protein C activity, and decreased Antithrombin III), HFpEF, CKD stage III, centrilobular emphysema and pulmonary fibrosis on daily prednisone- never smoker, chronic venous insufficiency w/ chronic foot ulcers, and diverticulitis.   Daughter reports patient has not felt well since her hospitalization in June at [facility] where she was diagnosed with PE/ DVT and placed on Eliquis in which daughter states she is compliant with.  Since, patient also has been struggling with weight loss, early satiety, muscle weakness, and ongoing lower leg edema/ wounds.  Seen by GI and put on miralax per daughter for constipation.  Daughter also reports her prednisone has been recently tapered.    Patient was at home this morning around 1020, when she reported to her family that she felt like she was having a stroke. Family noticed a left facial droop and then patient proceeded to become unresponsive.  On EMS arrival, patient required continuous BVM. CBG noted 49, afebrile and was hypotensive requiring fluid bolus.  On arrival to ER, she remained altered  and required intubation for airway protection. 2L NS bolus given for hypotension.  She remained hypoglycemic despite multiple D50 requiring D10 gtt.  A right IJ central line was placed in ER. Labs noted for normal WBC/ Hgb, Na 132, CO2 10, sCr 1.38, AG 19, albumin 2.2, normal AST/ ALT, t. Bili 2.9, trop hs 34, LA 10.8, ETOH neg, ammonia 157. CT head without acute process.  CXR showing satisfactory position of ETT, layering right pleural effusion, diffuse interstitial thickening c/w hx of fibrosis.  ABG post intubation 6.954/ 43.4/ 31.3/ 9.6.  Patient remained normotensive but temp 95 requiring bairhugger.  PCCM called for admit.   Past Medical History  PAF, DVT, PE on Eliquis HFpEF CKD stage III RA Hypercoagulable state (increased protein S antigen, decreased protein C activity, and decreased Antithrombin III) Centrilobular emphysema and pulmonary fibrosis- non smoker Chronic venous insufficiency w/ chronic foot ulcers  Diverticulitis   Significant Hospital Events   9/15 admitted/ intubated 9/25-on full vent support, encephalopathy precluding extubation 9/26-not weaning, encephalopathic, dysautonomia  Consults:   Procedures:  9/15 ETT >> 9/15 R IJ TL CVC >> 9/15 OGT/ foley  Significant Diagnostic Tests:  9/15 CTH >> negative  9/15 CXR >> ETT 5.3 above carina, layering right pleural effusion, diffuse interstitial thickening c/w hx of fibrosis  9/15 CTA chest/ abd/ pelvis >> significant dilatation of RA and RV consistent with R heart failure; moderate R and small L pleural effusions with associated atelectasis of both lower lobes; underlying chronic cystic and fibrotic lung disease; severe anasarca; nodular contour of liver suggestive of possible cirrhosis 9/15 CTA head and  neck >> no significant carotid or vertebral artery stenosis in neck, no LVO, right jugular central venous catheter with probable surrounding hematoma  9/16 CXR >> stable mild b/l pulmonary edema with small pleural  effusions. No PTX noted  9/18 MR BRAIN>> no acute intracranial abnormality  9/20 CXR>> stable b/l pulm edema and pleural effusions   Micro Data:  9/15 BCx2 central line >> NG5D 9/15 BCx2 PIV >> Strept mitis 9/15 SARS2 >> neg 9/15 UC >> ESBL E coli 9/16 Trach asp cult >> Normal flora  Antimicrobials:  9/15 vanc >> 9/17 9/15 zosyn >> 9/20  Interim history/subjective:  9/26: No overnight events, Lethargic.  Issues with hyperthermia and dysautonomic symptoms 9/25: Lethargic, was able to follow nurses instructions this morning, coming down on dose of fentanyl, coming down on dose of Precedex 9/24: coox appropriate this am! Remains on milrinone at this time. Still not following commands, will cont with sbt despite this as tolerated. Ammonia slightly up to 41 today despite lactulose dosing yesterday 9/23: net negative 3.7L yesterday with >6L uop. Remains on milrinone and lasix gtt. Ammonia negative, still not following commands. ? If uremia at BUN 89 is playing role. Will send for cth. coox again 99... unsure where they are drawing the coox from in the early am as it appears to be the same issue. Recheck.  9/22: not following commands. Becomes htn-ive and tachycardic when sedation weaned. Remains on milrinone and lasix gtt. coox being repeated. ~3.6L uop yesterday  Objective   Blood pressure 112/63, pulse 90, temperature (!) 104.9 F (40.5 C), temperature source Bladder, resp. rate 20, height 5\' 11"  (1.803 m), weight 65.7 kg, last menstrual period 02/19/2013, SpO2 100 %. CVP:  [5 mmHg-10 mmHg] 5 mmHg  Vent Mode: PRVC FiO2 (%):  [40 %] 40 % Set Rate:  [20 bmp] 20 bmp Vt Set:  [500 mL] 500 mL PEEP:  [5 cmH20] 5 cmH20 Plateau Pressure:  [18 cmH20] 18 cmH20   Intake/Output Summary (Last 24 hours) at 07/03/2020 1610 Last data filed at 07/03/2020 0800 Gross per 24 hour  Intake 2020.25 ml  Output 2880 ml  Net -859.75 ml   Filed Weights   06/30/20 0500 07/02/20 0442 07/03/20 0422  Weight:  67.9 kg 68.2 kg 65.7 kg    Examination: General: Chronically ill-appearing, temporal muscle wasting, on a ventilator HENT: Moist oral mucosa, endotracheal tube in place Eyes: Reactive pupils, pale CV: S1-S2 appreciated Pulm: Clear to auscultation GI: Bowel sounds appreciated Neuro: Was able to follow commands this morning Extremities: 1+ bilateral edema Stiffness  Resolved Hospital Problem list    Assessment & Plan:  Cardiogenic shock -Severe right heart failure/cor pulmonale -On milrinone -Volume status better -Stable renal function -Off diuretics  Encephalopathy -Etiology remains unclear -Lactulose for hyperammonemia -CT head, MRI unrevealing -BUN/creatinine stabilizing -Weaning sedation  Acute hypoxic respiratory failure -SBT as tolerated -ABG noted for respiratory alkalosis, metabolic alkalosis  Acute on chronic liver failure -Continue lactulose -Ammonia level 43  ESBL E. coli UTI -Completed Zosyn  AKI -Related to hypoperfusion -Continue to trend -Avoid nephrotoxic's  Hyperthermia -This appears related to some autonomic dysfunction -Cool peripheries with warm cool -Send LDH, obtain lactate, obtain CK -Not on any medications to precipitate neuroleptic malignant syndrome  Continue to monitor closely Guarded prognosis although trending poorly  Best practice:  Diet: Tube feeds Pain/Anxiety/Delirium protocol (if indicated): Fentanyl, Precedex VAP protocol (if indicated): yes DVT prophylaxis: Continue heparin GI prophylaxis: PPI Glucose control: insulin Mobility: bedrest Code Status: full  Family Communication:  Discussed with daughter at bedside Disposition: ICU  Labs   CBC: Recent Labs  Lab 06/29/20 0511 06/29/20 0511 06/30/20 0615 07/01/20 0430 07/02/20 0452 07/02/20 1200 07/03/20 0401  WBC 20.8*  --  20.2* 22.0* 18.8*  --  26.5*  HGB 12.1   < > 14.5 13.3 14.2 16.0* 13.8  HCT 38.3   < > 44.6 42.2 44.1 47.0* 45.1  MCV 99.2  --  98.0  100.2* 100.9*  --  104.4*  PLT 314  --  391 386 399  --  405*   < > = values in this interval not displayed.    Basic Metabolic Panel: Recent Labs  Lab 06/27/20 1130 06/28/20 0629 06/29/20 0511 06/29/20 0511 06/30/20 0615 07/01/20 0843 07/02/20 0452 07/02/20 1200 07/03/20 0401  NA  --    < > 139   < > 136 141 141 142 142  K  --    < > 3.7   < > 4.6 4.0 3.7 4.7 4.1  CL  --    < > 100  --  94* 101 102  --  105  CO2  --    < > 24  --  26 27 25   --  24  GLUCOSE  --    < > 258*  --  255* 143* 133*  --  264*  BUN  --    < > 76*  --  89* 95* 93*  --  81*  CREATININE  --    < > 2.41*  --  2.16* 2.07* 1.73*  --  1.44*  CALCIUM  --    < > 8.8*  --  8.9 8.9 8.8*  --  8.9  MG 2.3  --   --   --  2.1  --   --   --   --   PHOS 3.3  --   --   --   --   --   --   --   --    < > = values in this interval not displayed.   GFR: Estimated Creatinine Clearance: 44.7 mL/min (A) (by C-G formula based on SCr of 1.44 mg/dL (H)). Recent Labs  Lab 06/30/20 0615 07/01/20 0430 07/02/20 0452 07/03/20 0401  WBC 20.2* 22.0* 18.8* 26.5*    Liver Function Tests: Recent Labs  Lab 06/29/20 0511 06/30/20 0615 07/01/20 0843 07/02/20 0452 07/03/20 0401  AST 23 24 29 24 24   ALT 23 24 27 26  33  ALKPHOS 110 124 121 111 110  BILITOT 1.1 1.3* 1.3* 1.5* 1.9*  PROT 6.1* 6.9 6.3* 6.1* 6.3*  ALBUMIN 2.0* 2.1* 2.1* 2.1* 2.0*   No results for input(s): LIPASE, AMYLASE in the last 168 hours. Recent Labs  Lab 06/27/20 1130 06/29/20 1319 06/30/20 0957 07/03/20 0401  AMMONIA 18 31 41* 43*    ABG    Component Value Date/Time   PHART 7.502 (H) 07/02/2020 1200   PCO2ART 36.6 07/02/2020 1200   PO2ART 139 (H) 07/02/2020 1200   HCO3 28.7 (H) 07/02/2020 1200   TCO2 30 07/02/2020 1200   ACIDBASEDEF 11.0 (H) 06/23/2020 1316   O2SAT 98.9 07/03/2020 0401     Coagulation Profile: No results for input(s): INR, PROTIME in the last 168 hours.  Cardiac Enzymes: No results for input(s): CKTOTAL, CKMB,  CKMBINDEX, TROPONINI in the last 168 hours.  HbA1C: Hgb A1c MFr Bld  Date/Time Value Ref Range Status  06/23/2020 05:34 AM 5.5 4.8 - 5.6 % Final    Comment:    (  NOTE) Pre diabetes:          5.7%-6.4%  Diabetes:              >6.4%  Glycemic control for   <7.0% adults with diabetes   07/17/2007 02:46 PM   Final   5.3 (NOTE)   The ADA recommends the following therapeutic goals for glycemic   control related to Hgb A1C measurement:   Goal of Therapy:   < 7.0% Hgb A1C   Action Suggested:  > 8.0% Hgb A1C   Ref:  Diabetes Care, 22, Suppl. 1, 1999    CBG: Recent Labs  Lab 07/03/20 0156 07/03/20 0203 07/03/20 0359 07/03/20 0403 07/03/20 0742  GLUCAP 41* 203* 100* 249* 298*    The patient is critically ill with multiple organ systems failure and requires high complexity decision making for assessment and support, frequent evaluation and titration of therapies, application of advanced monitoring technologies and extensive interpretation of multiple databases. Critical Care Time devoted to patient care services described in this note independent of APP/resident time (if applicable)  is 40 minutes.   Sherrilyn Rist MD [facility] Personal pager: [phone] If unanswered, please page CCM On-call: [phone]

## 2020-07-03 NOTE — Progress Notes (Addendum)
Neurology Progress Note   Recalled back for altered mental status, central fever with cold extremities.  HPI: Carolyn Proctor is a 59 y.o. female pertinent risk factor in PHx  PAF, DVT, and PE on Eliquis, SLE, RA,  hypercoagulable state (increased protein S antigen, decreased protein C activity, and decreased Antithrombin III), HFpEF, CKD  stage III, centrilobular emphysema and pulmonary fibrosis on daily prednisone (recently tapered off), chronic venous insufficiency w/ chronic foot ulcers  initially admitted for acute respiratory failure secondary to ILD w/combined decompensated RHF w/cardiogenic shock and pulmonary edema.   Neurology team was consulted last week for witnessed seizure activity, LTM demonstrated left frontal sharps from L aterior temporal region with severe diffuse encephalopathy which has been treated/managed on keppra.  After resolution of seizure activity, neurology team signed off.  Neurology team was asked to re-evaluate patient today for concern of autonomic dysfunction and/or possible neuroleptic malignant syndrome. Over the last 24 hours, patient has gone from following commands and being fairly alert to now being somnolent, not following commands, no longer withdraws to pain on extremities. Pt has also developed a central fever-rectal temperature is 104 but extremities are extremely cold.    Attending addendum Patient seen and examined. Patient's daughter at bedside: Reports that the patient has been inflicted with rheumatoid arthritis as well as SLE for many many years and has been on multiple medications.  Rheumatology notes mention that she was on Rituxan as an outpatient and the last note from January 2021 says to continue Rituxan.  Past Medical History:  Diagnosis Date  . Acute myopericarditis 02/24/2013  . Atrial fibrillation ([place])   . Atrial fibrillation with RVR ([place]) 02/22/2013  . Chronic anticoagulation, Xarelto 04/02/2013  . Chronic diastolic CHF (congestive  heart failure) ([place]) 06/26/2019  . Diverticulitis   . Elevated troponin- secondary to Myopericarditis 02/25/2013  . Emphysema  07/07/2019  . Hypertension   . Lupus ([place]) 09/14/2011  . 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 PAF (paroxysmal atrial fibrillation) ([place]) 04/02/2013  . Pleuritic chest pain 02/22/2013  . 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 Rheumatoid arthritis ([place]) 07/07/2019  . 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 Right ventricular dysfunction 08/05/2019  . Thrombocytosis ([place]) 09/14/2011    Family History  Problem Relation Age of Onset  . Lupus Mother   . Liver disease Mother   . Hypertension Maternal Grandmother   . Lupus Brother    Social History:   reports that she has never smoked. She has never used smokeless tobacco. She reports that she does not drink alcohol and does not use drugs.  Medications  Current Facility-Administered Medications:  .  Place/Maintain arterial line, , , Until Discontinued **AND** 0.9 %  sodium chloride infusion, , Intra-arterial, PRN, Agarwala, Ravi, MD .  Place/Maintain arterial line, , , Until Discontinued **AND** 0.9 %  sodium chloride infusion, , Intra-arterial, PRN, Olalere, Adewale A, MD .  acetaminophen (TYLENOL) 160 MG/5ML solution 650 mg, 650 mg, Per Tube, Q6H PRN, Olalere, Adewale A, MD, 650 mg at 07/03/20 0750 .  amiodarone (PACERONE) tablet 400 mg, 400 mg, Per Tube, BID, Bensimhon, Shaune Pascal, MD, 400 mg at 07/03/20 1204 .  chlorhexidine gluconate (MEDLINE KIT) (PERIDEX) 0.12 % solution 15 mL, 15 mL, Mouth Rinse, BID, Simpson, Paula B, NP, 15 mL at 07/03/20 0758 .  Chlorhexidine Gluconate  Cloth 2 % PADS 6 each, 6 each, Topical, Q0600, Kipp Brood, MD, 6 each at 07/02/20 2330 .  dexmedetomidine (PRECEDEX) 400 MCG/100ML (4 mcg/mL) infusion, 0.4-1.8 mcg/kg/hr, Intravenous, Titrated,  Audria Nine, DO, Last Rate: 15.28 mL/hr at 07/03/20 1300, 0.9 mcg/kg/hr at 07/03/20 1300 .  dextrose 50 % solution 50 mL, 50 mL, Intravenous, Once, [place], Shona Needles, MD .  docusate (COLACE) 50 MG/5ML liquid 100 mg, 100 mg, Per Tube, BID PRN, Audria Nine, DO .  feeding supplement (PROSource TF) liquid 90 mL, 90 mL, Per Tube, BID, Agarwala, Ravi, MD, 90 mL at 07/03/20 0959 .  feeding supplement (VITAL 1.5 CAL) liquid 1,000 mL, 1,000 mL, Per Tube, Continuous, Agarwala, Ravi, MD, Last Rate: 45 mL/hr at 07/02/20 1630, 1,000 mL at 07/02/20 1630 .  fentaNYL (SUBLIMAZE) bolus via infusion 50 mcg, 50 mcg, Intravenous, Q15 min PRN, Deno Etienne, DO, 50 mcg at 06/27/20 1852 .  fentaNYL 2526mg in NS 2568m(1072mml) infusion-PREMIX, 50-200 mcg/hr, Intravenous, Continuous, FloDeno EtienneO, Last Rate: 2.5 mL/hr at 07/03/20 1300, 25 mcg/hr at 07/03/20 1300 .  heparin ADULT infusion 100 units/mL (25000 units/250m64mdium chloride 0.45%), 1,250 Units/hr, Intravenous, Continuous, Dang, Thuy D, RPH, Last Rate: 12.5 mL/hr at 07/03/20 1300, 1,250 Units/hr at 07/03/20 1300 .  influenza vac split quadrivalent PF (FLUARIX) injection 0.5 mL, 0.5 mL, Intramuscular, Tomorrow-1000, Agarwala, Ravi, MD .  insulin aspart (novoLOG) injection 0-9 Units, 0-9 Units, Subcutaneous, Q4H, AgarKipp Brood, 3 Units at 07/03/20 1204 .  insulin aspart (novoLOG) injection 3 Units, 3 Units, Subcutaneous, Q4H, Agarwala, Ravi, MD, 3 Units at 07/03/20 1204 .  lactulose (CHRONULAC) 10 GM/15ML solution 30 g, 30 g, Per Tube, BID, Bensimhon, DaniShaune Pascal, 30 g at 07/03/20 0958 .  levETIRAcetam (KEPPRA) 100 MG/ML solution 500 mg, 500 mg, Per Tube, BID, [place]ucker, MattBonna Gains, 500 mg at 07/03/20 0959 .  MEDLINE mouth rinse, 15 mL, Mouth Rinse, 10 times per day, SimpJennelle HumanNP, 15 mL at 07/03/20 1142 .  midazolam (VERSED) injection 2 mg, 2 mg, Intravenous, Q2H PRN, FloyDeno Etienne, 2 mg at 06/29/20 2000 .  pantoprazole sodium  (PROTONIX) 40 mg/20 mL oral suspension 40 mg, 40 mg, Per Tube, Daily, Agarwala, Ravi, MD, 40 mg at 07/03/20 0958 .  polyethylene glycol (MIRALAX / GLYCOLAX) packet 17 g, 17 g, Per Tube, Daily PRN, MarsAudria Nine .  [COMPLETED] predniSONE (DELTASONE) tablet 60 mg, 60 mg, Per Tube, Daily, 60 mg at 07/01/20 1059 **FOLLOWED BY** predniSONE (DELTASONE) tablet 40 mg, 40 mg, Per Tube, Daily, 40 mg at 07/03/20 0959 **FOLLOWED BY** [START ON 07/05/2020] predniSONE (DELTASONE) tablet 20 mg, 20 mg, Per Tube, Daily, MarsAudria Nine  ROS:   Unable to obtain due to altered mental status.   Exam: Current vital signs: BP (!) 122/91   Pulse (!) 115   Temp (!) 103.2 F (39.6 C) (Axillary)   Resp (!) 36   Ht '5\' 11"'  (1.803 m)   Wt 65.7 kg   LMP 02/19/2013   SpO2 100%   BMI 20.20 kg/m  Vital signs in last 24 hours: Temp:  [98.3 F (36.8 C)-104.9 F (40.5 C)] 103.2 F (39.6 C) (09/26 1318) Pulse Rate:  [68-117] 115 (09/26 1300) Resp:  [20-36] 36 (09/26 1300) BP: (98-131)/(59-91) 122/91 (09/26 1300) SpO2:  [99 %-100 %] 100 % (09/26 1300) Arterial Line BP: (125-173)/(51-75) 156/70 (09/26 1300) FiO2 (%):  [40 %] 40 % (09/26 0400) Weight:  [65.7 kg] 65.7 kg (09/26 0422)  Constitutional:  Appears thin with bitemporal wasting.  She is extremely cold to touch on her extremities when the rectal monitor shows a temperature of upwards of 40 C. Psych: pt is sedated Eyes: No scleral injection HENT: ET tube in place. Head: Normocephalic.  LTM leads in place Cardiovascular: Normal rate and regular rhythm.  Respiratory: mechanical breath sounds bilat   Neuro: (exam limited d/t being intubated and sedated) Mental Status: Patient is asleep, opens eyes to loud voice. Not following commands. Cranial Nerves: II: UTA. Blinks to threat III,IV, VI: Her eyes are relatively midline, with a slight exotropia which worsens as she becomes more sleepy V: VII blinks to eyelid stimulation bilaterally Motor:  Spontaneously moving right upper extremity more than left upper extremity-but not to command.  Some withdrawal bilaterally to lower extremities. Sensory: As above Reflexes: patient has generalized areflexia.  Normal tone.  Mute toes.  Cerebellar: UTA  Labs I have reviewed labs in epic and the results pertinent to this consultation are:  CBC    Component Value Date/Time   WBC 26.5 (H) 07/03/2020 0401   RBC 4.32 07/03/2020 0401   HGB 15.0 07/03/2020 0909   HGB 14.0 08/17/2019 1520   HGB 13.8 04/02/2013 0901   HCT 44.0 07/03/2020 0909   HCT 42.4 08/17/2019 1520   HCT 40.9 04/02/2013 0901   PLT 405 (H) 07/03/2020 0401   PLT 449 08/17/2019 1520   MCV 104.4 (H) 07/03/2020 0401   MCV 90 08/17/2019 1520   MCV 99 04/02/2013 0901   MCH 31.9 07/03/2020 0401   MCHC 30.6 07/03/2020 0401   RDW 19.5 (H) 07/03/2020 0401   RDW 16.3 (H) 08/17/2019 1520   RDW 12.7 04/02/2013 0901   LYMPHSABS 0.7 06/22/2020 1139   LYMPHSABS 2.0 04/02/2013 0901   MONOABS 0.7 06/22/2020 1139   EOSABS 0.4 06/22/2020 1139   EOSABS 0.5 04/02/2013 0901   BASOSABS 0.1 06/22/2020 1139   BASOSABS 0.1 04/02/2013 0901    CMP     Component Value Date/Time   NA 143 07/03/2020 0909   NA 139 08/17/2019 1520   K 4.0 07/03/2020 0909   CL 105 07/03/2020 0401   CO2 24 07/03/2020 0401   GLUCOSE 264 (H) 07/03/2020 0401   BUN 81 (H) 07/03/2020 0401   BUN 18 08/17/2019 1520   CREATININE 1.44 (H) 07/03/2020 0401   CREATININE 0.75 03/31/2013 1536   CALCIUM 8.9 07/03/2020 0401   PROT 6.3 (L) 07/03/2020 0401   ALBUMIN 2.0 (L) 07/03/2020 0401   AST 24 07/03/2020 0401   ALT 33 07/03/2020 0401   ALKPHOS 110 07/03/2020 0401   BILITOT 1.9 (H) 07/03/2020 0401   GFRNONAA 40 (L) 07/03/2020 0401   GFRAA 47 (L) 07/03/2020 0401    Lipid Panel     Component Value Date/Time   CHOL  07/18/2007 0305    137        ATP III CLASSIFICATION:  <200     mg/dL   Desirable  200-239  mg/dL   Borderline High  >=240    mg/dL   High    TRIG 59 07/02/2020 2111   HDL 39 (L) 07/18/2007 0305   CHOLHDL 3.5 07/18/2007 0305   VLDL 10 07/18/2007 0305   LDLCALC  07/18/2007 0305    88        Total Cholesterol/HDL:CHD Risk Coronary Heart Disease Risk Table                     Men   Women  1/2 Average Risk   3.4   3.3    Imaging I have reviewed the images obtained:  CT-scan of the brain 9/24--  1. No acute intracranial findings. 2. Unchanged partial bilateral mastoid opacification, right greater than left. 3. Fluid and debris within the posterior nasopharynx.  MRI examination of the brain-  9/18-- 1. No acute intracranial abnormality. 2. Generalized cerebral volume loss since 2008, otherwise unremarkable for age noncontrast MRI appearance of the brain.  Assessment:  59 yo F with PMH of PAF, DVT, and PE on Eliquis, RA, SLE, hypercoagulable state, HFpEF, CKD stage III, centrilobular emphysema and pulmonary fibrosis, chronic venous insufficiency w/ chronic foot ulcers initially admitted for acute respiratory failure secondary to ILD w/combined decompensated RHF w/cardiogenic shock and pulmonary edema with a witnessed seizure, requiring initiation on keppra.  Neurology recalled because of waxing and waning and possibly worsening mental status with central fever and cold extremities.  Dysautonomia is likely given her history of lupus but other causes of temperature dysregulation should also be looked at. In particular amiodarone, which can affect thyroid function could be a culprit.  Pure autonomic dysautonomia is usually present with blood pressure variations but infiltrates possible to have a thermal dysregulation type of dysautonomia as well. That said, given her history of SLE, being on Rituxan in the past, other etiologies such as infection or lupus cerebritis should also be explored.  Strokes are also common in patients with lupus-imaging thus far has been unremarkable MRI examination of the brain 06/25/2020 did not show any  acute changes.  Impression:  Altered mental status, possible dysautonomia - patient presentation likely due to multifactorial toxic metabolic encephalopathy, could be due to new stroke vs lupus cerebritis vs seizures vs infectious etiology given immunocompromise status as well as heart failure.   Recommendations: - continue keppra - repeat routine EEG - MRI w/wo contrast ordered to be completed tomorrow when kidney function improves-GFR is just at 40 today. - repeat CXR, UA -Check TSH, T3-T4 given she is on amiodarone to test for any thyroid function issues at this time.   Neurology team will continue to follow  Posey Pronto PA-C [facility] [phone]  Discussed with attending neurologist review of note to follow from Dr. Rory Percy.   Attending Neurohospitalist Addendum Patient seen and examined with APP/Resident. Agree with the history and physical as documented above. Agree with the plan as documented, which I helped formulate. I have independently reviewed the chart, obtained history, review of systems and examined the patient.I have personally reviewed pertinent head/neck/spine imaging (CT/MRI). Please feel free to call with any questions. --- Amie Portland, MD [facility] Pager: [phone]  If 7pm to 7am, please call on call as listed on AMION.

## 2020-07-03 NOTE — Procedures (Signed)
Patient Name: Carolyn Proctor  MRN: [id number]  Epilepsy Attending: [name]  Referring Physician/Provider: Posey Pronto, PA Date: 07/03/2020 Duration: 28.42 mins  Patient history: 32o F with ams. EEG to evaluate for seizure.  Level of alertness: comatose  AEDs during EEG study: LEV  Technical aspects: This EEG study was done with scalp electrodes positioned according to the 10-20 International system of electrode placement. Electrical activity was acquired at a sampling rate of 500Hz  and reviewed with a high frequency filter of 70Hz  and a low frequency filter of 1Hz . EEG data were recorded continuously and digitally stored.   Description: EEG showed continuous generalized at times sharply contoured 6-8hz  theta-alpha activity as well as brief periods of generalized low amplitude 13-15hz  beta activity.  Hyperventilation and photic stimulation were not performed.     ABNORMALITY -Continuous slow, generalized  IMPRESSION: This study is suggestive of severe diffuse encephalopathy, nonspecific etiology. No seizures or epileptiform discharges were seen throughout the recording.   Carolyn Proctor Carolyn Proctor

## 2020-07-03 NOTE — Progress Notes (Addendum)
Cooling blanket placed to trunk area at this time.  Rectal temp currently 103.1

## 2020-07-04 ENCOUNTER — Inpatient Hospital Stay (HOSPITAL_COMMUNITY): Payer: BC Managed Care – PPO

## 2020-07-04 ENCOUNTER — Inpatient Hospital Stay: Payer: Self-pay

## 2020-07-04 LAB — COOXEMETRY PANEL
Carboxyhemoglobin: 2.3 % — ABNORMAL HIGH (ref 0.5–1.5)
Carboxyhemoglobin: 1.9 % — ABNORMAL HIGH (ref 0.5–1.5)
Methemoglobin: 1.1 % (ref 0.0–1.5)
Methemoglobin: 1 % (ref 0.0–1.5)
O2 Saturation: 98.9 %
O2 Saturation: 69.1 %
Total hemoglobin: 12.3 g/dL (ref 12.0–16.0)
Total hemoglobin: 12.4 g/dL (ref 12.0–16.0)

## 2020-07-04 LAB — GLUCOSE, CAPILLARY
Glucose-Capillary: 107 mg/dL — ABNORMAL HIGH (ref 70–99)
Glucose-Capillary: 127 mg/dL — ABNORMAL HIGH (ref 70–99)
Glucose-Capillary: 138 mg/dL — ABNORMAL HIGH (ref 70–99)
Glucose-Capillary: 150 mg/dL — ABNORMAL HIGH (ref 70–99)
Glucose-Capillary: 200 mg/dL — ABNORMAL HIGH (ref 70–99)
Glucose-Capillary: 217 mg/dL — ABNORMAL HIGH (ref 70–99)
Glucose-Capillary: 245 mg/dL — ABNORMAL HIGH (ref 70–99)

## 2020-07-04 LAB — COMPREHENSIVE METABOLIC PANEL
ALT: 44 U/L (ref 0–44)
AST: 29 U/L (ref 15–41)
Albumin: 1.8 g/dL — ABNORMAL LOW (ref 3.5–5.0)
Alkaline Phosphatase: 87 U/L (ref 38–126)
Anion gap: 10 (ref 5–15)
BUN: 82 mg/dL — ABNORMAL HIGH (ref 6–20)
CO2: 23 mmol/L (ref 22–32)
Calcium: 8.3 mg/dL — ABNORMAL LOW (ref 8.9–10.3)
Chloride: 113 mmol/L — ABNORMAL HIGH (ref 98–111)
Creatinine, Ser: 1.44 mg/dL — ABNORMAL HIGH (ref 0.44–1.00)
GFR calc Af Amer: 47 mL/min — ABNORMAL LOW (ref >60)
GFR calc non Af Amer: 40 mL/min — ABNORMAL LOW (ref >60)
Glucose, Bld: 228 mg/dL — ABNORMAL HIGH (ref 70–99)
Potassium: 3.8 mmol/L (ref 3.5–5.1)
Sodium: 146 mmol/L — ABNORMAL HIGH (ref 135–145)
Total Bilirubin: 1.9 mg/dL — ABNORMAL HIGH (ref 0.3–1.2)
Total Protein: 5.9 g/dL — ABNORMAL LOW (ref 6.5–8.1)

## 2020-07-04 LAB — CBC
HCT: 39.5 % (ref 36.0–46.0)
HCT: 39.5 % (ref 36.0–46.0)
Hemoglobin: 12.1 g/dL (ref 12.0–15.0)
Hemoglobin: 12.1 g/dL (ref 12.0–15.0)
MCH: 32.6 pg (ref 26.0–34.0)
MCH: 32.4 pg (ref 26.0–34.0)
MCHC: 30.6 g/dL (ref 30.0–36.0)
MCHC: 30.6 g/dL (ref 30.0–36.0)
MCV: 106.5 fL — ABNORMAL HIGH (ref 80.0–100.0)
MCV: 105.9 fL — ABNORMAL HIGH (ref 80.0–100.0)
Platelets: 328 10*3/uL (ref 150–400)
Platelets: 330 10*3/uL (ref 150–400)
RBC: 3.71 MIL/uL — ABNORMAL LOW (ref 3.87–5.11)
RBC: 3.73 MIL/uL — ABNORMAL LOW (ref 3.87–5.11)
RDW: 19.4 % — ABNORMAL HIGH (ref 11.5–15.5)
RDW: 19.4 % — ABNORMAL HIGH (ref 11.5–15.5)
WBC: 35.6 10*3/uL — ABNORMAL HIGH (ref 4.0–10.5)
WBC: 37.2 10*3/uL — ABNORMAL HIGH (ref 4.0–10.5)
nRBC: 0.1 % (ref 0.0–0.2)
nRBC: 0.1 % (ref 0.0–0.2)

## 2020-07-04 LAB — HEPARIN LEVEL (UNFRACTIONATED): Heparin Unfractionated: 0.47 IU/mL (ref 0.30–0.70)

## 2020-07-04 LAB — LACTIC ACID, PLASMA: Lactic Acid, Venous: 2.1 mmol/L (ref 0.5–1.9)

## 2020-07-04 MED ORDER — VANCOMYCIN HCL 500 MG/100ML IV SOLN
500.0000 mg | Freq: Two times a day (BID) | INTRAVENOUS | Status: DC
  Filled 2020-07-04: qty 100

## 2020-07-04 MED ORDER — SODIUM CHLORIDE 0.9% FLUSH
10.0000 mL | INTRAVENOUS | Status: DC | PRN
  Administered 2020-07-25: 10 mL

## 2020-07-04 MED ORDER — VANCOMYCIN HCL IN DEXTROSE 1-5 GM/200ML-% IV SOLN
1000.0000 mg | Freq: Once | INTRAVENOUS | Status: AC
  Administered 2020-07-04: 1000 mg via INTRAVENOUS

## 2020-07-04 MED ORDER — SODIUM CHLORIDE 0.9% FLUSH
10.0000 mL | Freq: Two times a day (BID) | INTRAVENOUS | Status: DC
  Administered 2020-07-04 – 2020-07-16 (×22): 10 mL
  Administered 2020-07-16: 30 mL
  Administered 2020-07-17 – 2020-07-18 (×4): 10 mL
  Administered 2020-07-19: 20 mL
  Administered 2020-07-19: 10 mL
  Administered 2020-07-20: 20 mL
  Administered 2020-07-20 – 2020-07-23 (×6): 10 mL
  Administered 2020-07-23: 30 mL
  Administered 2020-07-24: 10 mL
  Administered 2020-07-24: 30 mL
  Administered 2020-07-25 – 2020-07-26 (×2): 10 mL

## 2020-07-04 MED ORDER — AMIODARONE HCL 200 MG PO TABS
200.0000 mg | ORAL_TABLET | Freq: Two times a day (BID) | ORAL | Status: DC
  Administered 2020-07-04: 200 mg
  Filled 2020-07-04: qty 1

## 2020-07-04 MED ORDER — INSULIN ASPART 100 UNIT/ML ~~LOC~~ SOLN
0.0000 [IU] | SUBCUTANEOUS | Status: DC
  Administered 2020-07-04: 2 [IU] via SUBCUTANEOUS
  Administered 2020-07-04: 3 [IU] via SUBCUTANEOUS
  Administered 2020-07-04: 5 [IU] via SUBCUTANEOUS
  Administered 2020-07-05 (×2): 3 [IU] via SUBCUTANEOUS
  Administered 2020-07-05: 5 [IU] via SUBCUTANEOUS
  Administered 2020-07-05: 8 [IU] via SUBCUTANEOUS
  Administered 2020-07-06 (×2): 2 [IU] via SUBCUTANEOUS
  Administered 2020-07-06 (×2): 3 [IU] via SUBCUTANEOUS
  Administered 2020-07-06: 2 [IU] via SUBCUTANEOUS
  Administered 2020-07-06: 3 [IU] via SUBCUTANEOUS
  Administered 2020-07-07 (×2): 2 [IU] via SUBCUTANEOUS
  Administered 2020-07-07 (×2): 3 [IU] via SUBCUTANEOUS
  Administered 2020-07-07 – 2020-07-08 (×3): 2 [IU] via SUBCUTANEOUS
  Administered 2020-07-08 – 2020-07-09 (×2): 3 [IU] via SUBCUTANEOUS
  Administered 2020-07-09: 2 [IU] via SUBCUTANEOUS
  Administered 2020-07-09 (×2): 3 [IU] via SUBCUTANEOUS
  Administered 2020-07-10: 2 [IU] via SUBCUTANEOUS
  Administered 2020-07-10 – 2020-07-11 (×4): 3 [IU] via SUBCUTANEOUS
  Administered 2020-07-11: 2 [IU] via SUBCUTANEOUS
  Administered 2020-07-12: 3 [IU] via SUBCUTANEOUS
  Administered 2020-07-12 (×2): 2 [IU] via SUBCUTANEOUS
  Administered 2020-07-13: 3 [IU] via SUBCUTANEOUS
  Administered 2020-07-13 – 2020-07-14 (×2): 2 [IU] via SUBCUTANEOUS
  Administered 2020-07-14 (×2): 3 [IU] via SUBCUTANEOUS
  Administered 2020-07-15: 2 [IU] via SUBCUTANEOUS
  Administered 2020-07-15 (×2): 3 [IU] via SUBCUTANEOUS
  Administered 2020-07-16 (×3): 2 [IU] via SUBCUTANEOUS
  Administered 2020-07-17 – 2020-07-18 (×2): 3 [IU] via SUBCUTANEOUS
  Administered 2020-07-18 – 2020-07-19 (×2): 2 [IU] via SUBCUTANEOUS

## 2020-07-04 MED ORDER — ACETAMINOPHEN 160 MG/5ML PO SOLN
650.0000 mg | ORAL | Status: DC | PRN
  Administered 2020-07-04 – 2020-07-12 (×3): 650 mg
  Filled 2020-07-04 (×3): qty 20.3

## 2020-07-04 MED ORDER — SODIUM CHLORIDE 0.9 % IV SOLN
2.0000 g | Freq: Two times a day (BID) | INTRAVENOUS | Status: DC
  Administered 2020-07-04 – 2020-07-06 (×3): 2 g via INTRAVENOUS
  Filled 2020-07-04 (×4): qty 2

## 2020-07-04 MED ORDER — SODIUM CHLORIDE 0.9 % IV BOLUS
500.0000 mL | Freq: Once | INTRAVENOUS | Status: AC
  Administered 2020-07-04: 500 mL via INTRAVENOUS

## 2020-07-04 MED ORDER — VANCOMYCIN HCL IN DEXTROSE 1-5 GM/200ML-% IV SOLN
1000.0000 mg | Freq: Once | INTRAVENOUS | Status: DC
  Filled 2020-07-04: qty 200

## 2020-07-04 MED ORDER — NOREPINEPHRINE 4 MG/250ML-% IV SOLN
0.0000 ug/min | INTRAVENOUS | Status: DC
  Administered 2020-07-04: 2 ug/min via INTRAVENOUS
  Filled 2020-07-04: qty 250

## 2020-07-04 NOTE — Progress Notes (Signed)
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), Afib, protein C deficiency  No Known Allergies  Patient Measurements: Height: 5\' 11"  (180.3 cm) Weight: 63.2 kg (139 lb 5.3 oz) IBW/kg (Calculated) : 70.8 Heparin Dosing Weight: 74.2 kg  Vital Signs: Temp: 99.7 F (37.6 C) (09/27 0926) Temp Source: Rectal (09/27 0926) BP: 97/56 (09/27 0900) Pulse Rate: 88 (09/27 0844)  Labs: Recent Labs    07/02/20 0452 07/02/20 1200 07/03/20 0401 07/03/20 0401 07/03/20 0908 07/03/20 0909 07/04/20 0545 07/04/20 0840  HGB 14.2   < > 13.8   < >  --  15.0 12.1  --   HCT 44.1   < > 45.1  --   --  44.0 39.5  --   PLT 399  --  405*  --   --   --  328  --   HEPARINUNFRC 0.26*  --  0.26*  --  0.30  --   --  0.47  CREATININE 1.73*  --  1.44*  --   --   --  1.44*  --   CKTOTAL  --   --   --   --  22*  --   --   --    < > = values in this interval not displayed.    Estimated Creatinine Clearance: 43 mL/min (A) (by C-G formula based on SCr of 1.44 mg/dL (H)).  Assessment: 59 year old Female on Apixaban PTA for recent hx DVT/PE in June 2021, also with hx Afib with the last dose of apixaban on 9/14 PM. Now on hold and continuing on heparin for bridging and anticoagulation. Will follow heparin levels only now with apixaban no longer influencing heparin levels.   Heparin level therapeutic 0.47 today AM. No problems with active bleeding, issues with the lines, or drawing the lab per RN.  CBC 12.1 ; plt 328  Goal of Therapy:  Heparin level 0.3-0.7 units/ml Monitor platelets by anticoagulation protocol: Yes   Plan:  Continue heparin gtt at 1250 units/hr Daily heparin level and CBC  Wilson Singer, PharmD PGY1 Pharmacy Resident \\9 /27/2021 9:31 AM

## 2020-07-04 NOTE — Progress Notes (Signed)
Peripherally Inserted Central Catheter Placement  The IV Nurse has discussed with the patient and/or persons authorized to consent for the patient, the purpose of this procedure and the potential benefits and risks involved with this procedure.  The benefits include less needle sticks, lab draws from the catheter, and the patient may be discharged home with the catheter. Risks include, but not limited to, infection, bleeding, blood clot (thrombus formation), and puncture of an artery; nerve damage and irregular heartbeat and possibility to perform a PICC exchange if needed/ordered by physician.  Alternatives to this procedure were also discussed.  Bard Power PICC patient education guide, fact sheet on infection prevention and patient information card has been provided to patient /or left at bedside.   Consent obtained via telephone with daughter Bailyn Spackman .  PICC Placement Documentation  PICC Triple Lumen 07/04/20 PICC Right Brachial 37 cm 0 cm (Active)  Indication for Insertion or Continuance of Line Prolonged intravenous therapies 07/04/20 1300  Exposed Catheter (cm) 0 cm 07/04/20 1300  Site Assessment Clean;Dry;Intact 07/04/20 1300  Lumen #1 Status Flushed;Saline locked;Blood return noted 07/04/20 1300  Lumen #2 Status Flushed;Saline locked;Blood return noted 07/04/20 1300  Lumen #3 Status Flushed;Saline locked;Blood return noted 07/04/20 1300  Dressing Type Transparent;Securing device 07/04/20 1300  Dressing Status Clean;Dry;Intact 07/04/20 1300  Antimicrobial disc in place? Yes 07/04/20 1300  Line Care Connections checked and tightened 07/04/20 1300  Dressing Intervention New dressing 07/04/20 1300  Dressing Change Due 07/11/20 07/04/20 1300       Holley Bouche Renee 07/04/2020, 1:15 PM

## 2020-07-04 NOTE — Progress Notes (Signed)
Patient noted with more hematuria in Foley than this AM.  Heparin rate unchanged. Vitals stable. CCM aware, STAT CBC ordered and obtained.

## 2020-07-04 NOTE — Progress Notes (Signed)
[facility] Progress Note Patient Name: Carolyn Proctor DOB: [birth year] MRN: [id number]   Date of Service  07/04/2020  HPI/Events of Note  Notified of core temp 104.9. Reportedly has been having fevers the past 3 days. Physical cooling measures ongoing. Off antibiotics.  eICU Interventions  Increase tylenol to q4 Blood cultures x 2 sets     Intervention Category Major Interventions: Other:  Judd Lien 07/04/2020, 1:27 AM

## 2020-07-04 NOTE — Progress Notes (Addendum)
Pharmacy Antibiotic Note  Carolyn Proctor is a 59 y.o. female admitted on 06/22/2020 with sepsis.  Pharmacy has been consulted for vancomycin and cefepime dosing. Patient has been spiking fevers with Tmax 104F with elevated WBC of 35.6. Given LOT of 13 days, concern for nosocomial-related sepsis. Respiratory and blood cultures have been ordered.   Plan: Vancomcyin load of 1000mg  IV x1 followed by Vancomycin 500 IV every 12 hours.  Goal trough 15-20 mcg/mL.  Cefepime 2g IV q12h  Monitor renal function, CBC, clinical progress, and micro data Monitor vanc troughs at steady state  Height: 5\' 11"  (180.3 cm) Weight: 63.2 kg (139 lb 5.3 oz) IBW/kg (Calculated) : 70.8  Temp (24hrs), Avg:102.4 F (39.1 C), Min:98.6 F (37 C), Max:104.9 F (40.5 C)  Recent Labs  Lab 06/30/20 0615 07/01/20 0430 07/01/20 0843 07/02/20 0452 07/03/20 0401 07/03/20 0908 07/04/20 0545  WBC 20.2* 22.0*  --  18.8* 26.5*  --  35.6*  CREATININE 2.16*  --  2.07* 1.73* 1.44*  --  1.44*  LATICACIDVEN  --   --   --   --   --  2.8*  --     Estimated Creatinine Clearance: 43 mL/min (A) (by C-G formula based on SCr of 1.44 mg/dL (H)).    No Known Allergies  Antimicrobials this admission: Vanc 9/15 x 1; restart 9/16 >> 9/17; 9/27> Zosyn 9/15 x 1; restart 9/16 >> 9/20 Cefepime 9/27>  9/15 BCx >> 2/4 Strep mitis/oralis (likely contaminant) 9/15 UCx >> 100k col ESBL Ecoli (s-zosyn) 9/16 RCx (trach aspirate) - negative 9/16 MRSA- negative  9/26: Bcx order placed 9/26: RCx order placed   Thank you for allowing pharmacy to be a part of this patient's care.  Carolin Guernsey  PGY1 pharmacy resident 07/04/2020 9:16 AM

## 2020-07-04 NOTE — Progress Notes (Signed)
Per CCM, kidney function unchanged. Hold MRI until tomorrow pending kidney function. MRI aware.

## 2020-07-04 NOTE — Progress Notes (Addendum)
Phlebotomy unable to obtain blood cx. Vanco not given yet until blood cultures can be obtained. Patient's temp improving at this time. CCM aware.  Will send another phleb to attempt blood cx.  PICC to be placed, but not allowed to draw a set of blood cultures off new PICC per IV team.

## 2020-07-04 NOTE — Progress Notes (Addendum)
Advanced Heart Failure Rounding Note   Subjective:    Remains intubated. Not weaning due to encephalopathy  Diuretics stopped 9/24  Milrinone stopped 9/26. CO-OX 99%.    Febrile today. WBC trending up 26>35    Objective:   Weight Range:  Vital Signs:   Temp:  [98.6 F (37 C)-104.9 F (40.5 C)] 102.2 F (39 C) (09/27 0900) Pulse Rate:  [80-115] 88 (09/27 0844) Resp:  [17-36] 23 (09/27 0900) BP: (90-129)/(54-91) 97/56 (09/27 0900) SpO2:  [86 %-100 %] 100 % (09/27 0900) Arterial Line BP: (130-177)/(44-70) 130/48 (09/27 0900) FiO2 (%):  [40 %] 40 % (09/27 0844) Weight:  [63.2 kg] 63.2 kg (09/27 0500) Last BM Date: 07/03/20  Weight change: Filed Weights   07/02/20 0442 07/03/20 0422 07/04/20 0500  Weight: 68.2 kg 65.7 kg 63.2 kg    Intake/Output:   Intake/Output Summary (Last 24 hours) at 07/04/2020 0924 Last data filed at 07/04/2020 0900 Gross per 24 hour  Intake 2506.89 ml  Output 2500 ml  Net 6.89 ml    CVP 7  Physical Exam: General:  Intubated.  HEENT: ETT  Neck: supple.  Carotids 2+ bilat; no bruits. No lymphadenopathy or thryomegaly appreciated. RIJ Cor: PMI nondisplaced. Regular rate & rhythm. No rubs, gallops or murmurs. Lungs: Rhonchi throughout.  Abdomen: soft, nontender, nondistended. No hepatosplenomegaly. No bruits or masses. Good bowel sounds. Extremities: no cyanosis, clubbing, rash, edema Neuro: sedated on vent  GU: Foley appears red with thick sediment.   Telemetry: NSR 60-70s No VT  Labs: Basic Metabolic Panel: Recent Labs  Lab 06/27/20 1130 06/28/20 0629 06/30/20 0615 06/30/20 0615 07/01/20 7654 07/01/20 6503 07/02/20 0452 07/02/20 1200 07/03/20 0401 07/03/20 0909 07/04/20 0545  NA  --    < > 136   < > 141   < > 141 142 142 143 146*  K  --    < > 4.6   < > 4.0   < > 3.7 4.7 4.1 4.0 3.8  CL  --    < > 94*  --  101  --  102  --  105  --  113*  CO2  --    < > 26  --  27  --  25  --  24  --  23  GLUCOSE  --    < > 255*  --   143*  --  133*  --  264*  --  228*  BUN  --    < > 89*  --  95*  --  93*  --  81*  --  82*  CREATININE  --    < > 2.16*  --  2.07*  --  1.73*  --  1.44*  --  1.44*  CALCIUM  --    < > 8.9   < > 8.9   < > 8.8*  --  8.9  --  8.3*  MG 2.3  --  2.1  --   --   --   --   --   --   --   --   PHOS 3.3  --   --   --   --   --   --   --   --   --   --    < > = values in this interval not displayed.    Liver Function Tests: Recent Labs  Lab 06/30/20 0615 07/01/20 0843 07/02/20 0452 07/03/20 0401 07/04/20 0545  AST 24 29 24  24 29  ALT _0 33 44  ALKPHOS 124 121 111 110 87  BILITOT 1.3* 1.3* 1.5* 1.9* 1.9*  PROT 6.9 6.3* 6.1* 6.3* 5.9*  ALBUMIN 2.1* 2.1* 2.1* 2.0* 1.8*   No results for input(s): LIPASE, AMYLASE in the last 168 hours. Recent Labs  Lab 06/29/20 1319 06/30/20 0957 07/03/20 0401  AMMONIA 31 41* 43*    CBC: Recent Labs  Lab 06/30/20 0615 06/30/20 0615 07/01/20 0430 07/01/20 0430 07/02/20 0452 07/02/20 1200 07/03/20 0401 07/03/20 0909 07/04/20 0545  WBC 20.2*  --  22.0*  --  18.8*  --  26.5*  --  35.6*  HGB 14.5   < > 13.3   < > 14.2 16.0* 13.8 15.0 12.1  HCT 44.6   < > 42.2   < > 44.1 47.0* 45.1 44.0 39.5  MCV 98.0  --  100.2*  --  100.9*  --  104.4*  --  106.5*  PLT 391  --  386  --  399  --  405*  --  328   < > = values in this interval not displayed.    Cardiac Enzymes: Recent Labs  Lab 07/03/20 0908  CKTOTAL 22*    BNP: BNP (last 3 results) No results for input(s): BNP in the last 8760 hours.  ProBNP (last 3 results) No results for input(s): PROBNP in the last 8760 hours.    Other results:  Imaging: DG Chest 1 View  Result Date: 07/03/2020 CLINICAL DATA:  Cough. EXAM: CHEST  1 VIEW COMPARISON:  06/27/2020 CT 06/22/2020 FINDINGS: Tip of the enteric tube 5.4 cm from the carina. The enteric tube is in place with tip below the diaphragm not included in the field of view. Right internal jugular central line tip in the upper SVC. Small  pleural effusions, left greater than right with slight improvement from prior exam. There is improvement in interstitial thickening. Probable fibrotic change in the lung bases. No pneumothorax or evidence of pneumomediastinum. Mild cardiomegaly is similar. IMPRESSION: 1. Improving interstitial thickening, likely improving pulmonary edema. 2. Small pleural effusions, left greater than right, with slight improvement from prior exam. 3. Support apparatus unchanged. Electronically Signed   By: [name] M.D.   On: 07/03/2020 15:23   EEG adult  Result Date: 07/03/2020 [name], MD     07/03/2020  8:23 PM Patient Name: Carolyn Proctor MRN: [id number] Epilepsy Attending: [name] Referring Physician/Provider: Posey Pronto, PA Date: 07/03/2020 Duration: 28.42 mins Patient history: 38o F with ams. EEG to evaluate for seizure. Level of alertness: comatose AEDs during EEG study: LEV Technical aspects: This EEG study was done with scalp electrodes positioned according to the 10-20 International system of electrode placement. Electrical activity was acquired at a sampling rate of 500Hz and reviewed with a high frequency filter of 70Hz and a low frequency filter of 1Hz. EEG data were recorded continuously and digitally stored. Description: EEG showed continuous generalized at times sharply contoured 6-8hz theta-alpha activity as well as brief periods of generalized low amplitude 13-15hz beta activity.  Hyperventilation and photic stimulation were not performed.   ABNORMALITY -Continuous slow, generalized IMPRESSION: This study is suggestive of severe diffuse encephalopathy, nonspecific etiology. No seizures or epileptiform discharges were seen throughout the recording. [name]     Medications:     Scheduled Medications: . amiodarone  400 mg Per Tube BID  . chlorhexidine gluconate (MEDLINE KIT)  15 mL Mouth Rinse BID  . Chlorhexidine Gluconate Cloth  6 each Topical Q0600  .  dextrose  50 mL Intravenous Once  . feeding supplement (PROSource TF)  90 mL Per Tube BID  . influenza vac split quadrivalent PF  0.5 mL Intramuscular Tomorrow-1000  . insulin aspart  0-9 Units Subcutaneous Q4H  . insulin aspart  3 Units Subcutaneous Q4H  . lactulose  30 g Per Tube BID  . levETIRAcetam  500 mg Per Tube BID  . mouth rinse  15 mL Mouth Rinse 10 times per day  . pantoprazole sodium  40 mg Per Tube Daily  . [START ON 07/05/2020] predniSONE  20 mg Per Tube Daily    Infusions: . sodium chloride    . sodium chloride    . dexmedetomidine (PRECEDEX) IV infusion 0.9 mcg/kg/hr (07/04/20 0910)  . feeding supplement (VITAL 1.5 CAL) 1,000 mL (07/03/20 1621)  . fentaNYL infusion INTRAVENOUS Stopped (07/03/20 1512)  . heparin 1,250 Units/hr (07/04/20 0916)    PRN Medications: Place/Maintain arterial line **AND** sodium chloride, Place/Maintain arterial line **AND** sodium chloride, acetaminophen (TYLENOL) oral liquid 160 mg/5 mL, docusate, fentaNYL, midazolam, polyethylene glycol    Plan/Discussion:    1. 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 Will eventually need repeat RHC and VQ  - RH cath in 12/20 with mild PAH with normal RV function. - Echo 06/22/20 Echo LV small compressed from RV EF 45-50% RV massively dilated and severely hypokinetic. Severe TR RVSP 76 Severe D-sign. Moderate pericardial effusion  - PFTs much better than I expected based on CT chest. DLCO markedly reduced - She has severe RV failure. Milrinone stopped 9/26  - Diuretics stopped 07/01/20. CVP 7. No diuretics for now  - Unable to wean due to encephalopathy -.  RHC after extubation  2. Severe encephalopathy - etiology unclear - repeat head CT unrevealing.  - brain MRI on 9/18 with no evidence vasculitis (has received steroids) - may need repeat MRI and  consider increasing steroids again -EEG- suggestive of severe diffuse encephalopathy, nonspecific etiology. No seizures or epileptiform discharges were seen.   3. Acute hypoxic respiratory failure - multifactorial including PAH, ILD, large R effusion, pulmonary edema and possible aspiration  - CCM managing vent. Unable to wean due to encephalopathy  4. 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 Down to 49.  - Remains on lactulose   5. CTD with h/o RA and SLE - most markers negative  - not clear evidence that this was a flare.  - weaning steroids.   6. DVT/PE - diagnosed 6/21 at [facility] - CT this admit. Negative for acute PE - continue heparin   6. AFL/atrial tach with runs NSVT  - SR over night.  - Cut amio to 200 mg twice a day.  - Keep K> 4.0 Mg > 2.0  7. UTI with ESBL (e.coli) - per CCM - 9/26 UA with moderate leukocytes.  Rare bacteria.   8. ID Febrile.  WBC 26>35  Respiratory and blood culutres obtained today.   9. Hypernatremia Sodium trending up 143>146   Length of Stay: [place], NP-C  07/04/2020, 9:24 AM  Advanced Heart Failure Team Pager [phone] (M-F; 7a - 4p)  Please contact [facility] for night-coverage after hours (4p -7a ) and weekends on [email or web address]  Agree with  above.   Awake on vent this am and following commands but had high fever and was profoundly hypotensive. Now on NE. WBC 35K. RV severely dilated and hypokinetic on bedside echo done by Dr. Lake Bells.   CVP 10. Co-ox 69%  General: Frail. Ill-appearing on vent. Awake. Following commands HEENT: normal + ETT Neck: supple. JVP 10  Carotids 2+ bilat; no bruits. No lymphadenopathy or thryomegaly appreciated. Cor: PMI nondisplaced. Regular rate & rhythm. 2/6 TR Lungs: clear Abdomen: soft, nontender, nondistended. No hepatosplenomegaly. No bruits or masses. Good bowel sounds. Extremities: no cyanosis, clubbing,  rash, edema + TED hose Neuro: awake on vent follows commands  Case d/w with Dr. Lake Bells. She has had rapid progression of RV failure along with mental status changes and lung infiltrates in setting of known CTD. Agree that this is mostly likely CTD flare. Volume status now much improved with diuresis. Concern for sepsis this am. Agree with cultures, broad spectrum abx. Continue steroids. Will likely need prolonged taper. RHC when off vent and more stable.   CRITICAL CARE Performed by: Glori Bickers  Total critical care time: 35 minutes  Critical care time was exclusive of separately billable procedures and treating other patients.  Critical care was necessary to treat or prevent imminent or life-threatening deterioration.  Critical care was time spent personally by me (independent of midlevel providers or residents) on the following activities: development of treatment plan with patient and/or surrogate as well as nursing, discussions with consultants, evaluation of patient's response to treatment, examination of patient, obtaining history from patient or surrogate, ordering and performing treatments and interventions, ordering and review of laboratory studies, ordering and review of radiographic studies, pulse oximetry and re-evaluation of patient's condition.  Glori Bickers, MD  2:13 PM

## 2020-07-04 NOTE — Progress Notes (Signed)
Phleb called three times about blood cultures. Phleb changing shifts, will send new phleb up asap. CCM aware.

## 2020-07-04 NOTE — Progress Notes (Signed)
NAME:  Carolyn Proctor, MRN:  [id number], DOB:  Jan 20, 1961, LOS: 12 ADMISSION DATE:  06/22/2020, CONSULTATION DATE:  9/15 REFERRING MD:  Tyrone Nine, CHIEF COMPLAINT:  Confusion, refractory hypoglycemia   Brief History   6 yoF at home who complained to family she felt like she was having a stroke, then developed left facial droop and went unresponsive.  Required BVM by EMS and noted to be hypoglycemic and hypotensive.  Intubated in ER for airway protection.  CTH negative.  Refractory hypoglycemia despite multiple D50 amps and required a drip.  PCCM called to admit.   Past Medical History  PAF, DVT, PE on Eliquis HFpEF CKD stage III RA Hypercoagulable state (increased protein S antigen, decreased protein C activity, and decreased Antithrombin III) Centrilobular emphysema and pulmonary fibrosis- non smoker Chronic venous insufficiency w/ chronic foot ulcers  Diverticulitis   Significant Hospital Events   9/15 admitted/ intubated ~pulse dose steroids, diuresis, cardiogenic shock support 9/25-on full vent support, encephalopathy precluding extubation 9/26-not weaning, encephalopathic, dysautonomia, off milrinonoe  Consults:  Advanced heart failure  Procedures:  9/15 ETT >> 9/15 R IJ TL CVC >> 9/15 OGT/ foley  Significant Diagnostic Tests:  9/15 CTH >> negative 9/15 CTA chest/ abd/ pelvis >> significant dilatation of RA and RV consistent with R heart failure; moderate R and small L pleural effusions with associated atelectasis of both lower lobes; underlying chronic cystic and fibrotic lung disease; severe anasarca; nodular contour of liver suggestive of possible cirrhosis 9/15 Echo> LVEF 35-40%, mod decreased LV function, RVSP 75, severely reduced RV systolic function; R to left shunt noted, suggestive of PFO, RA severely dilated, mod pericardial effusion 9/15 CTA head and neck >> no significant carotid or vertebral artery stenosis in neck, no LVO, right jugular central venous catheter  with probable surrounding hematoma 9/18 MR BRAIN>> no acute intracranial abnormality  Micro Data:  9/15 BCx2 central line >> NG5D 9/15 BCx2 PIV >> Strept mitis 9/15 SARS2 >> neg 9/15 UC >> ESBL E coli 9/16 Trach asp cult >> Normal flora   Antimicrobials:  9/15 vanc >> 9/17 9/15 zosyn >> 9/20    Interim history/subjective:   [place] 99% Hyperthermia over the weekend Followed some commands this weekend  Objective   Blood pressure (!) 96/59, pulse 89, temperature (!) 104 F (40 C), resp. rate (!) 25, height 5\' 11"  (1.803 m), weight 63.2 kg, last menstrual period 02/19/2013, SpO2 (!) 86 %. CVP:  [5 mmHg-16 mmHg] 6 mmHg  Vent Mode: PRVC FiO2 (%):  [40 %] 40 % Set Rate:  [20 bmp] 20 bmp Vt Set:  [500 mL] 500 mL PEEP:  [5 cmH20] 5 cmH20 Plateau Pressure:  [18 cmH20-29 cmH20] 29 cmH20   Intake/Output Summary (Last 24 hours) at 07/04/2020 0748 Last data filed at 07/04/2020 0700 Gross per 24 hour  Intake 2518.48 ml  Output 2480 ml  Net 38.48 ml   Filed Weights   07/02/20 0442 07/03/20 0422 07/04/20 0500  Weight: 68.2 kg 65.7 kg 63.2 kg    Examination:  General:  In bed on vent HENT: NCAT ETT in place PULM: Rhonchi B, vent supported breathing CV: RRR, no mgr GI: BS+, soft, nontender MSK: normal bulk and tone Derm: scleridactily Neuro: sedated on vent    Resolved Hospital Problem list     Assessment & Plan:  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 Continue prednisone for now as appears septic Consider pulse steroids again this week if cultures clear  Cardiogenic shock/severe pulmonary hypertension WHO group 1, 3> now off milrinone as of 9/26 Discuss with cardiology, for now hold off milrinone as COOX  OK  Acute encephalopathy> lupus flare? MRI Brain when kidney function improved (today?)  Acute pulmonary embolism diagnosed June 2021 Heparin infusion  Sepsis: could be nosocomial (HCAP, line) Remove CVL Blood, resp cultures vanc/zosyn  Acute hypoxemic respiratory failure CTD-ILD with possible acute pneumonitis Full mechanical vent support VAP prevention Daily WUA/SBT  Acute on chronic liver failure (cirrhosis) Hepatic encephalopathy Continue lactulose scheduled  ESBL E. Coli UTI> completed antibiotics Monitor fever/sepsis work up today  AKI> improving Monitor BMET and UOP Replace electrolytes as needed  Hyperthermia: sepsis? F/u fever work up from today  Hyperglycemia Adjust SSI to moderate scale   Best practice:  Diet: tube feeding Pain/Anxiety/Delirium protocol (if indicated): yes, precedex, RASS goal 0 to -1 VAP protocol (if indicated): yes DVT prophylaxis: heparin in fusion GI prophylaxis: Pantoprazole for stress ulcer prophylaxis Glucose control: as above Mobility: bed rest Code Status: full Family Communication: updated her aunt by phone on 9/27 Disposition: remain I ICU  Labs   CBC: Recent Labs  Lab 06/30/20 0615 06/30/20 0615 07/01/20 0430 07/01/20 0430 07/02/20 0452 07/02/20 1200 07/03/20 0401 07/03/20 0909 07/04/20 0545  WBC 20.2*  --  22.0*  --  18.8*  --  26.5*  --  35.6*  HGB 14.5   < > 13.3   < > 14.2 16.0* 13.8 15.0 12.1  HCT 44.6   < > 42.2   < > 44.1 47.0* 45.1 44.0 39.5  MCV 98.0  --  100.2*  --  100.9*  --  104.4*  --  106.5*  PLT 391  --  386  --  399  --  405*  --  328   < > = values in this interval not displayed.    Basic Metabolic Panel: Recent Labs  Lab 06/27/20 1130 06/28/20 0629 06/30/20 0615 06/30/20 0615 07/01/20 1062 07/01/20 6948 07/02/20 0452 07/02/20 1200 07/03/20 0401 07/03/20 0909 07/04/20 0545  NA  --    < > 136   < > 141   < > 141 142 142 143 146*  K  --    < > 4.6   < > 4.0   < > 3.7 4.7 4.1  4.0 3.8  CL  --    < > 94*  --  101  --  102  --  105  --  113*  CO2  --    < > 26  --  27  --  25  --  24  --  23  GLUCOSE  --    < > 255*  --  143*  --  133*  --  264*  --  228*  BUN  --    < > 89*  --  95*  --  93*  --  81*  --  82*  CREATININE  --    < > 2.16*  --  2.07*  --  1.73*  --  1.44*  --  1.44*  CALCIUM  --    < > 8.9  --  8.9  --  8.8*  --  8.9  --  8.3*  MG 2.3  --  2.1  --   --   --   --   --   --   --   --  PHOS 3.3  --   --   --   --   --   --   --   --   --   --    < > = values in this interval not displayed.   GFR: Estimated Creatinine Clearance: 43 mL/min (A) (by C-G formula based on SCr of 1.44 mg/dL (H)). Recent Labs  Lab 07/01/20 0430 07/02/20 0452 07/03/20 0401 07/03/20 0908 07/04/20 0545  WBC 22.0* 18.8* 26.5*  --  35.6*  LATICACIDVEN  --   --   --  2.8*  --     Liver Function Tests: Recent Labs  Lab 06/30/20 0615 07/01/20 0843 07/02/20 0452 07/03/20 0401 07/04/20 0545  AST 24 29 24 24 29   ALT 24 27 26  33 44  ALKPHOS 124 121 111 110 87  BILITOT 1.3* 1.3* 1.5* 1.9* 1.9*  PROT 6.9 6.3* 6.1* 6.3* 5.9*  ALBUMIN 2.1* 2.1* 2.1* 2.0* 1.8*   No results for input(s): LIPASE, AMYLASE in the last 168 hours. Recent Labs  Lab 06/27/20 1130 06/29/20 1319 06/30/20 0957 07/03/20 0401  AMMONIA 18 31 41* 43*    ABG    Component Value Date/Time   PHART 7.485 (H) 07/03/2020 0909   PCO2ART 33.2 07/03/2020 0909   PO2ART 116 (H) 07/03/2020 0909   HCO3 25.1 07/03/2020 0909   TCO2 26 07/03/2020 0909   ACIDBASEDEF 11.0 (H) 06/23/2020 1316   O2SAT 98.9 07/04/2020 0545     Coagulation Profile: No results for input(s): INR, PROTIME in the last 168 hours.  Cardiac Enzymes: Recent Labs  Lab 07/03/20 0908  CKTOTAL 22*    HbA1C: Hgb A1c MFr Bld  Date/Time Value Ref Range Status  06/23/2020 05:34 AM 5.5 4.8 - 5.6 % Final    Comment:    (NOTE) Pre diabetes:          5.7%-6.4%  Diabetes:              >6.4%  Glycemic control for   <7.0% adults  with diabetes   07/17/2007 02:46 PM   Final   5.3 (NOTE)   The ADA recommends the following therapeutic goals for glycemic   control related to Hgb A1C measurement:   Goal of Therapy:   < 7.0% Hgb A1C   Action Suggested:  > 8.0% Hgb A1C   Ref:  Diabetes Care, 22, Suppl. 1, 1999    CBG: Recent Labs  Lab 07/03/20 1154 07/03/20 1614 07/03/20 2005 07/04/20 0003 07/04/20 0346  GLUCAP 245* 217* 163* 127* 217*     Critical care time: 35 minutes    Roselie Awkward, MD [facility] Pager: [phone] Cell: [phone] If no response, call [phone]

## 2020-07-05 ENCOUNTER — Inpatient Hospital Stay (HOSPITAL_COMMUNITY): Payer: BC Managed Care – PPO

## 2020-07-05 LAB — COOXEMETRY PANEL
Carboxyhemoglobin: 1.8 % — ABNORMAL HIGH (ref 0.5–1.5)
Methemoglobin: 1 % (ref 0.0–1.5)
O2 Saturation: 75.2 %
Total hemoglobin: 11.8 g/dL — ABNORMAL LOW (ref 12.0–16.0)

## 2020-07-05 LAB — COMPREHENSIVE METABOLIC PANEL
ALT: 35 U/L (ref 0–44)
AST: 23 U/L (ref 15–41)
Albumin: 1.6 g/dL — ABNORMAL LOW (ref 3.5–5.0)
Alkaline Phosphatase: 95 U/L (ref 38–126)
Anion gap: 11 (ref 5–15)
BUN: 76 mg/dL — ABNORMAL HIGH (ref 6–20)
CO2: 21 mmol/L — ABNORMAL LOW (ref 22–32)
Calcium: 8.4 mg/dL — ABNORMAL LOW (ref 8.9–10.3)
Chloride: 114 mmol/L — ABNORMAL HIGH (ref 98–111)
Creatinine, Ser: 1.23 mg/dL — ABNORMAL HIGH (ref 0.44–1.00)
GFR calc Af Amer: 56 mL/min — ABNORMAL LOW (ref >60)
GFR calc non Af Amer: 49 mL/min — ABNORMAL LOW (ref >60)
Glucose, Bld: 228 mg/dL — ABNORMAL HIGH (ref 70–99)
Potassium: 3.6 mmol/L (ref 3.5–5.1)
Sodium: 146 mmol/L — ABNORMAL HIGH (ref 135–145)
Total Bilirubin: 1.3 mg/dL — ABNORMAL HIGH (ref 0.3–1.2)
Total Protein: 5.8 g/dL — ABNORMAL LOW (ref 6.5–8.1)

## 2020-07-05 LAB — GLUCOSE, CAPILLARY
Glucose-Capillary: 230 mg/dL — ABNORMAL HIGH (ref 70–99)
Glucose-Capillary: 197 mg/dL — ABNORMAL HIGH (ref 70–99)
Glucose-Capillary: 255 mg/dL — ABNORMAL HIGH (ref 70–99)
Glucose-Capillary: 189 mg/dL — ABNORMAL HIGH (ref 70–99)
Glucose-Capillary: 100 mg/dL — ABNORMAL HIGH (ref 70–99)

## 2020-07-05 LAB — CBC WITH DIFFERENTIAL/PLATELET
Abs Immature Granulocytes: 0 10*3/uL (ref 0.00–0.07)
Basophils Absolute: 0 10*3/uL (ref 0.0–0.1)
Basophils Relative: 0 %
Eosinophils Absolute: 0 10*3/uL (ref 0.0–0.5)
Eosinophils Relative: 0 %
HCT: 38.9 % (ref 36.0–46.0)
Hemoglobin: 11.8 g/dL — ABNORMAL LOW (ref 12.0–15.0)
Lymphocytes Relative: 0 %
Lymphs Abs: 0 10*3/uL — ABNORMAL LOW (ref 0.7–4.0)
MCH: 32.5 pg (ref 26.0–34.0)
MCHC: 30.3 g/dL (ref 30.0–36.0)
MCV: 107.2 fL — ABNORMAL HIGH (ref 80.0–100.0)
Monocytes Absolute: 1.1 10*3/uL — ABNORMAL HIGH (ref 0.1–1.0)
Monocytes Relative: 4 %
Neutro Abs: 26.8 10*3/uL — ABNORMAL HIGH (ref 1.7–7.7)
Neutrophils Relative %: 96 %
Platelets: 307 10*3/uL (ref 150–400)
RBC: 3.63 MIL/uL — ABNORMAL LOW (ref 3.87–5.11)
RDW: 19.8 % — ABNORMAL HIGH (ref 11.5–15.5)
WBC: 27.9 10*3/uL — ABNORMAL HIGH (ref 4.0–10.5)
nRBC: 0 % (ref 0.0–0.2)
nRBC: 0 /100 WBC

## 2020-07-05 LAB — HEPARIN LEVEL (UNFRACTIONATED): Heparin Unfractionated: 0.18 IU/mL — ABNORMAL LOW (ref 0.30–0.70)

## 2020-07-05 LAB — T3: T3, Total: 37 ng/dL — ABNORMAL LOW (ref 71–180)

## 2020-07-05 MED ORDER — INSULIN ASPART 100 UNIT/ML ~~LOC~~ SOLN
5.0000 [IU] | SUBCUTANEOUS | Status: DC
  Administered 2020-07-05 – 2020-07-14 (×41): 5 [IU] via SUBCUTANEOUS

## 2020-07-05 MED ORDER — VITAL 1.5 CAL PO LIQD
1000.0000 mL | ORAL | Status: DC
  Administered 2020-07-05 – 2020-07-11 (×4): 1000 mL
  Filled 2020-07-05 (×4): qty 1000

## 2020-07-05 MED ORDER — VITAL 1.5 CAL PO LIQD: 1000.0000 mL | ORAL | Status: DC

## 2020-07-05 MED ORDER — APIXABAN 5 MG PO TABS
5.0000 mg | ORAL_TABLET | Freq: Two times a day (BID) | ORAL | Status: DC
  Administered 2020-07-05 – 2020-07-07 (×5): 5 mg
  Filled 2020-07-05 (×5): qty 1

## 2020-07-05 MED ORDER — GADOBUTROL 1 MMOL/ML IV SOLN
6.4000 mL | Freq: Once | INTRAVENOUS | Status: AC | PRN
  Administered 2020-07-05: 6.4 mL via INTRAVENOUS

## 2020-07-05 MED ORDER — PREDNISONE 20 MG PO TABS
40.0000 mg | ORAL_TABLET | Freq: Every day | ORAL | Status: DC
  Administered 2020-07-05 – 2020-07-20 (×16): 40 mg
  Filled 2020-07-05 (×16): qty 2

## 2020-07-05 MED ORDER — FUROSEMIDE 10 MG/ML IJ SOLN
40.0000 mg | Freq: Once | INTRAMUSCULAR | Status: AC
  Administered 2020-07-05: 40 mg via INTRAVENOUS
  Filled 2020-07-05: qty 4

## 2020-07-05 MED ORDER — PROSOURCE TF PO LIQD
45.0000 mL | Freq: Three times a day (TID) | ORAL | Status: DC
  Administered 2020-07-05 – 2020-07-14 (×26): 45 mL
  Filled 2020-07-05 (×25): qty 45

## 2020-07-05 MED ORDER — AMIODARONE HCL 200 MG PO TABS
200.0000 mg | ORAL_TABLET | Freq: Every day | ORAL | Status: DC
  Administered 2020-07-05 – 2020-07-26 (×22): 200 mg
  Filled 2020-07-05 (×22): qty 1

## 2020-07-05 NOTE — Progress Notes (Addendum)
Advanced Heart Failure Rounding Note   Subjective:    Remains intubated. Now more alert on vent. Off NE and milrinone. Co-ox 75%  Cefepime and vanc. WBC 37 -> 27  Fevers coming down. RNs report when fevers spike she is less responsive. Distal extremities very cold at that time and centrally very hot.    Objective:   Weight Range:  Vital Signs:   Temp:  [99 F (37.2 C)-104 F (40 C)] 99 F (37.2 C) (09/28 0600) Pulse Rate:  [58-93] 60 (09/28 0400) Resp:  [20-31] 23 (09/28 0600) BP: (96-153)/(52-82) 105/59 (09/28 0600) SpO2:  [86 %-100 %] 100 % (09/28 0400) Arterial Line BP: (130-201)/(48-196) 154/62 (09/28 0400) FiO2 (%):  [40 %] 40 % (09/28 0400) Weight:  [64 kg] 64 kg (09/28 0500) Last BM Date: 07/04/20  Weight change: Filed Weights   07/03/20 0422 07/04/20 0500 07/05/20 0500  Weight: 65.7 kg 63.2 kg 64 kg    Intake/Output:   Intake/Output Summary (Last 24 hours) at 07/05/2020 0648 Last data filed at 07/05/2020 0600 Gross per 24 hour  Intake 2214.05 ml  Output 2145 ml  Net 69.05 ml     Physical Exam: General:  Weak appearing. No resp difficulty HEENT: normal Neck: supple. JVP 9 + prominent v-waves Carotids 2+ bilat; no bruits. No lymphadenopathy or thryomegaly appreciated. Cor: PMI nondisplaced. Regular rate & rhythm. No rubs, gallops or murmurs. Lungs: clear Abdomen: soft, nontender, + distended. No hepatosplenomegaly. No bruits or masses. Good bowel sounds. Extremities: no cyanosis, clubbing, rash, 1+ edema Neuro: awake on vent following commands  Telemetry: NSR 60-70s Personally reviewed   Labs: Basic Metabolic Panel: Recent Labs  Lab 06/30/20 0615 06/30/20 0615 07/01/20 0843 07/01/20 0843 07/02/20 0452 07/02/20 0452 07/02/20 1200 07/03/20 0401 07/03/20 0909 07/04/20 0545 07/05/20 0513  NA 136   < > 141   < > 141   < > 142 142 143 146* 146*  K 4.6   < > 4.0   < > 3.7   < > 4.7 4.1 4.0 3.8 3.6  CL 94*   < > 101  --  102  --   --  105   --  113* 114*  CO2 26   < > 27  --  25  --   --  24  --  23 21*  GLUCOSE 255*   < > 143*  --  133*  --   --  264*  --  228* 228*  BUN 89*   < > 95*  --  93*  --   --  81*  --  82* 76*  CREATININE 2.16*   < > 2.07*  --  1.73*  --   --  1.44*  --  1.44* 1.23*  CALCIUM 8.9   < > 8.9   < > 8.8*   < >  --  8.9  --  8.3* 8.4*  MG 2.1  --   --   --   --   --   --   --   --   --   --    < > = values in this interval not displayed.    Liver Function Tests: Recent Labs  Lab 07/01/20 0843 07/02/20 0452 07/03/20 0401 07/04/20 0545 07/05/20 0513  AST _0 ALT 27 26 33 44 35  ALKPHOS 121 111 110 87 95  BILITOT 1.3* 1.5* 1.9* 1.9* 1.3*  PROT 6.3* 6.1* 6.3* 5.9* 5.8*  ALBUMIN 2.1* 2.1* 2.0* 1.8* 1.6*   No results for input(s): LIPASE, AMYLASE in the last 168 hours. Recent Labs  Lab 06/29/20 1319 06/30/20 0957 07/03/20 0401  AMMONIA 31 41* 43*    CBC: Recent Labs  Lab 07/02/20 0452 07/02/20 1200 07/03/20 0401 07/03/20 0909 07/04/20 0545 07/04/20 1808 07/05/20 0513  WBC 18.8*  --  26.5*  --  35.6* 37.2* 27.9*  NEUTROABS  --   --   --   --   --   --  PENDING  HGB 14.2   < > 13.8 15.0 12.1 12.1 11.8*  HCT 44.1   < > 45.1 44.0 39.5 39.5 38.9  MCV 100.9*  --  104.4*  --  106.5* 105.9* 107.2*  PLT 399  --  405*  --  328 330 307   < > = values in this interval not displayed.    Cardiac Enzymes: Recent Labs  Lab 07/03/20 0908  CKTOTAL 22*    BNP: BNP (last 3 results) No results for input(s): BNP in the last 8760 hours.  ProBNP (last 3 results) No results for input(s): PROBNP in the last 8760 hours.    Other results:  Imaging: DG Chest 1 View  Result Date: 07/03/2020 CLINICAL DATA:  Cough. EXAM: CHEST  1 VIEW COMPARISON:  06/27/2020 CT 06/22/2020 FINDINGS: Tip of the enteric tube 5.4 cm from the carina. The enteric tube is in place with tip below the diaphragm not included in the field of view. Right internal jugular central line tip in the upper SVC. Small  pleural effusions, left greater than right with slight improvement from prior exam. There is improvement in interstitial thickening. Probable fibrotic change in the lung bases. No pneumothorax or evidence of pneumomediastinum. Mild cardiomegaly is similar. IMPRESSION: 1. Improving interstitial thickening, likely improving pulmonary edema. 2. Small pleural effusions, left greater than right, with slight improvement from prior exam. 3. Support apparatus unchanged. Electronically Signed   By: [name] M.D.   On: 07/03/2020 15:23   DG Chest Port 1 View  Result Date: 07/04/2020 CLINICAL DATA:  59 year old female with COVID-19. EXAM: PORTABLE CHEST 1 VIEW COMPARISON:  Portable chest 07/03/2020 and earlier, including chest CTA 06/22/2020 FINDINGS: Portable AP semi upright view at 1005 hours. Stable lines and tubes. Mildly larger lung volumes. Stable mediastinal contours including cardiomegaly. Substantially regressed coarse bilateral pulmonary interstitial opacity since 06/22/2020, ventilation stable since yesterday. Largely resolved bilateral pleural effusions. No pneumothorax, consolidation, or area of worsening ventilation. No acute osseous abnormality identified. Negative visible bowel gas pattern. IMPRESSION: 1. Stable lines and tubes. 2. Stable ventilation since yesterday. Substantially improved since 06/22/2020. 3. No new cardiopulmonary abnormality. Electronically Signed   By: Genevie Ann M.D.   On: 07/04/2020 10:16   EEG adult  Result Date: 07/03/2020 [name], MD     07/03/2020  8:23 PM Patient Name: Carolyn Proctor MRN: [id number] Epilepsy Attending: [name] Referring Physician/Provider: Posey Pronto, PA Date: 07/03/2020 Duration: 28.42 mins Patient history: 62o F with ams. EEG to evaluate for seizure. Level of alertness: comatose AEDs during EEG study: LEV Technical aspects: This EEG study was done with scalp electrodes positioned according to the 10-20 International system of  electrode placement. Electrical activity was acquired at a sampling rate of 500Hz and reviewed with a high frequency filter of 70Hz and a low frequency filter of 1Hz. EEG data were recorded continuously and digitally stored. Description: EEG showed continuous generalized at times sharply contoured 6-8hz theta-alpha activity as  well as brief periods of generalized low amplitude 13-15hz beta activity.  Hyperventilation and photic stimulation were not performed.   ABNORMALITY -Continuous slow, generalized IMPRESSION: This study is suggestive of severe diffuse encephalopathy, nonspecific etiology. No seizures or epileptiform discharges were seen throughout the recording. Priyanka O Yadav   [place] EKG SITE RITE  Result Date: 07/04/2020 If [facility] image not attached, placement could not be confirmed due to current cardiac rhythm.    Medications:     Scheduled Medications: . amiodarone  200 mg Per Tube BID  . chlorhexidine gluconate (MEDLINE KIT)  15 mL Mouth Rinse BID  . Chlorhexidine Gluconate Cloth  6 each Topical Q0600  . dextrose  50 mL Intravenous Once  . feeding supplement (PROSource TF)  90 mL Per Tube BID  . influenza vac split quadrivalent PF  0.5 mL Intramuscular Tomorrow-1000  . insulin aspart  0-15 Units Subcutaneous Q4H  . insulin aspart  3 Units Subcutaneous Q4H  . lactulose  30 g Per Tube BID  . levETIRAcetam  500 mg Per Tube BID  . mouth rinse  15 mL Mouth Rinse 10 times per day  . pantoprazole sodium  40 mg Per Tube Daily  . predniSONE  20 mg Per Tube Daily  . sodium chloride flush  10-40 mL Intracatheter Q12H    Infusions: . sodium chloride    . sodium chloride    . ceFEPime (MAXIPIME) IV Stopped (07/04/20 2214)  . dexmedetomidine (PRECEDEX) IV infusion 0.9 mcg/kg/hr (07/05/20 0600)  . feeding supplement (VITAL 1.5 CAL) 1,000 mL (07/04/20 1800)  . fentaNYL infusion INTRAVENOUS Stopped (07/03/20 1512)  . heparin 1,250 Units/hr (07/05/20 0600)  . norepinephrine (LEVOPHED)  Adult infusion Stopped (07/04/20 1744)  . vancomycin      PRN Medications: Place/Maintain arterial line **AND** sodium chloride, Place/Maintain arterial line **AND** sodium chloride, acetaminophen (TYLENOL) oral liquid 160 mg/5 mL, docusate, fentaNYL, midazolam, polyethylene glycol, sodium chloride flush    Plan/Discussion:    1. Shock  - initially cardiogenic shock due to severe RV failure/cor pulmonale  - Echo 06/22/20 Echo LV small compressed from RV EF 45-50% RV massively dilated and severely hypokinetic. Severe TR RVSP 76 Severe D-sign. Moderate pericardial effusion  - improved with milrinone and diuresis - On 9/27 developed component of septic shock. BCx pending. GNR in sputum - WBC improving on cefepime/vanc. Off NE - suspect fevers may also be related to vasculitic flare with peripheral vasoconstriction   2. Coleman RV/failure - RH cath in 12/20 with mild PAH with normal RV function. - PFTs much better than I expected based on CT chest. DLCO markedly reduced - Echo 06/22/20 Echo LV small compressed from RV EF 45-50% RV massively dilated and severely hypokinetic. Severe TR RVSP 76 Severe D-sign. Moderate pericardial effusion  - Milrinone stopped 9/26. Co-ox remains ok  - Volume going back up. Lasix 40 IV x 1 today . -  RHC after extubation  3. Severe encephalopathy - etiology unclear - repeat head CT unrevealing.  - brain MRI on 9/18 with no evidence vasculitis (has received steroids) - improved with increasing steroids - may need repeat MRI    4. Acute hypoxic respiratory failure - multifactorial including PAH, ILD, large R effusion, pulmonary edema and possible aspiration  - CCM managing vent. Unable to wean due to encephalopathy and sepsis - improving. Hopefully getting close to extubation   5. Cirrhosis with acute on chronic liver failure - suspect due to chronic RV failure - NH3 157 on admit but  tended down. Down to 67.  - Remains on lactulose   6. CTD with h/o RA  and SLE - Case d/w with Dr. [place] Bells. She has had rapid progression of RV failure along with mental status changes and lung infiltrates in setting of known CTD. Several markers elevated- - Agree that this is mostly likely CTD flare.  - Steroids have been increased  7. DVT/PE - diagnosed 6/21 at [facility] - CT this admit. Negative for acute PE - continue heparin   8. AFL/atrial tach with runs NSVT  - in NSR - Cut amio to 200daily  9. Hypernatremia Sodium stable at 416  CRITICAL CARE Performed by: Glori Bickers  Total critical care time: 35 minutes  Critical care time was exclusive of separately billable procedures and treating other patients.  Critical care was necessary to treat or prevent imminent or life-threatening deterioration.  Critical care was time spent personally by me (independent of midlevel providers or residents) on the following activities: development of treatment plan with patient and/or surrogate as well as nursing, discussions with consultants, evaluation of patient's response to treatment, examination of patient, obtaining history from patient or surrogate, ordering and performing treatments and interventions, ordering and review of laboratory studies, ordering and review of radiographic studies, pulse oximetry and re-evaluation of patient's condition.    Length of Stay: [place], MD 07/05/2020, 6:48 AM  Advanced Heart Failure Team Pager [phone] (M-F; 7a - 4p)  Please contact [facility] for night-coverage after hours (4p -7a ) and weekends on [email or web address]

## 2020-07-05 NOTE — Progress Notes (Signed)
ANTICOAGULATION CONSULT NOTE - Follow Up Consult  Pharmacy Consult for heparin Indication: history of pulmonary embolus and DVT and history of afib   No Known Allergies  Patient Measurements: Height: 5\' 11"  (180.3 cm) Weight: 64 kg (141 lb 1.5 oz) IBW/kg (Calculated) : 70.8 Heparin Dosing Weight: 64kg  Vital Signs: Temp: 99 F (37.2 C) (09/28 0600) Temp Source: Bladder (09/28 0400) BP: 105/59 (09/28 0600) Pulse Rate: 60 (09/28 0400)  Labs: Recent Labs    07/03/20 0401 07/03/20 0401 07/03/20 0908 07/03/20 0909 07/04/20 0545 07/04/20 0545 07/04/20 0840 07/04/20 1808 07/05/20 0513  HGB 13.8  --   --    < > 12.1   < >  --  12.1 11.8*  HCT 45.1  --   --    < > 39.5  --   --  39.5 38.9  PLT 405*   < >  --   --  328  --   --  330 307  HEPARINUNFRC 0.26*   < > 0.30  --   --   --  0.47  --  0.18*  CREATININE 1.44*  --   --   --  1.44*  --   --   --  1.23*  CKTOTAL  --   --  22*  --   --   --   --   --   --    < > = values in this interval not displayed.    Estimated Creatinine Clearance: 51 mL/min (A) (by C-G formula based on SCr of 1.23 mg/dL (H)).  Assessment: 59 year old Female on Apixaban PTA for recent hx DVT/PE in June 2021, also with hx Afib with the last dose of apixaban on 9/14 PM. Now on hold and continuing on heparin for bridging and anticoagulation. Following heparin levels only now with apixaban no longer influencing heparin levels.   Heparin level subtherapeutic 0.18 today AM. No indication that heparin was held overnight.  RN does report some hematuria vs. amber urine  Slight decrease on CBC 11.8; plt 307  Goal of Therapy:  Heparin level 0.3-0.7 units/ml Monitor platelets by anticoagulation protocol: Yes   Plan:  Increase heparin to 1350 units/hr 6hr heparin level at 1400 and daily heparin levels Monitor for s/sx bleeding, CBC, and heparin levels   Wilson Singer, PharmD PGY1 Pharmacy Resident 07/05/2020 8:07 AM

## 2020-07-05 NOTE — Progress Notes (Signed)
NAME:  Carolyn Proctor, MRN:  [id number], DOB:  05/01/1961, LOS: 51 ADMISSION DATE:  06/22/2020, CONSULTATION DATE:  9/15 REFERRING MD:  Tyrone Nine, CHIEF COMPLAINT:  Confusion, refractory hypoglycemia   Brief History   25 yoF at home who complained to family she felt like she was having a stroke, then developed left facial droop and went unresponsive.  Required BVM by EMS and noted to be hypoglycemic and hypotensive.  Intubated in ER for airway protection.  CTH negative.  Refractory hypoglycemia despite multiple D50 amps and required a drip.  PCCM called to admit.   Past Medical History  PAF, DVT, PE on Eliquis HFpEF CKD stage III RA Hypercoagulable state (increased protein S antigen, decreased protein C activity, and decreased Antithrombin III) Centrilobular emphysema and pulmonary fibrosis- non smoker Chronic venous insufficiency w/ chronic foot ulcers  Diverticulitis   Significant Hospital Events   9/15 admitted/ intubated ~pulse dose steroids, diuresis, cardiogenic shock support 9/25-on full vent support, encephalopathy precluding extubation 9/26-not weaning, encephalopathic, dysautonomia, off milrinonoe  Consults:  Advanced heart failure  Procedures:  9/15 ETT >> 9/15 R IJ TL CVC >>9/27 9/15 OGT/ foley 9/27 PICC >   Significant Diagnostic Tests:  9/15 CTH >> negative 9/15 CTA chest/ abd/ pelvis >> significant dilatation of RA and RV consistent with R heart failure; moderate R and small L pleural effusions with associated atelectasis of both lower lobes; underlying chronic cystic and fibrotic lung disease; severe anasarca; nodular contour of liver suggestive of possible cirrhosis 9/15 Echo> LVEF 35-40%, mod decreased LV function, RVSP 75, severely reduced RV systolic function; R to left shunt noted, suggestive of PFO, RA severely dilated, mod pericardial effusion 9/15 CTA head and neck >> no significant carotid or vertebral artery stenosis in neck, no LVO, right jugular central  venous catheter with probable surrounding hematoma 9/18 MR BRAIN>> no acute intracranial abnormality  Micro Data:  9/15 BCx2 central line >> NG5D 9/15 BCx2 PIV >> Strept mitis 9/15 SARS2 >> neg 9/15 UC >> ESBL E coli 9/16 Trach asp cult >> Normal flora 9/27 blood >  9/27 resp > GNR   Antimicrobials:  9/15 vanc >> 9/17 9/15 zosyn >> 9/20   9/27 vanc > 9/28 9/27 cefepime >   Interim history/subjective:   Fever curve improved Pulled CVL Started antibiotics More awake this morning   Objective   Blood pressure (!) 105/59, pulse 60, temperature 99 F (37.2 C), resp. rate (!) 23, height 5\' 11"  (1.803 m), weight 64 kg, last menstrual period 02/19/2013, SpO2 100 %. CVP:  [1 mmHg-12 mmHg] 10 mmHg  Vent Mode: PRVC FiO2 (%):  [40 %] 40 % Set Rate:  [20 bmp] 20 bmp Vt Set:  [500 mL] 500 mL PEEP:  [5 cmH20] 5 cmH20 Plateau Pressure:  [17 cmH20-23 cmH20] 20 cmH20   Intake/Output Summary (Last 24 hours) at 07/05/2020 0724 Last data filed at 07/05/2020 0600 Gross per 24 hour  Intake 1943.76 ml  Output 2145 ml  Net -201.24 ml   Filed Weights   07/03/20 0422 07/04/20 0500 07/05/20 0500  Weight: 65.7 kg 63.2 kg 64 kg    Examination:  General:  In bed on vent HENT: NCAT ETT in place PULM: CTA B, vent supported breathing CV: RRR, no mgr GI: BS+, soft, nontender MSK: normal bulk and tone Neuro: sedated on vent     Resolved Hospital Problem list     Assessment & Plan:  Connective tissue disease: known rheumatoid arthritis and SLE: clinical picture consistent with  mixed connective tissue disease, features of small vessel vasculitis with underlying lupus and RA (pneumonitis, nephritis with active urinary sediment), PR3 ELISA positive, RNP positive, RA positive labs, CRP elevated. Continue prednisone 40mg  daily for now Consider pulse dose steroids if she doesn't have HCAP or other nosocomial infection (if cultures negative)  Cardiogenic shock/severe pulmonary hypertension  WHO group 1, 3> now off milrinone as of 9/26 Hold off milrinone, monitor COOX and CVP  Acute encephalopathy> lupus flare? MRI brain today  Acute pulmonary embolism diagnosed June 2021 Heparin infusion> change to eliquis  Sepsis/feer 9/26 > no clear source yet, GNR in resp culture f/u cultures Stop vanc Continue empiric cefepime  Acute hypoxemic respiratory failure > improved? CTD-ILD with possible acute pneumonitis Continue prednisone Pressure support wean today VAP prevention Daily WUA/SBT  Acute on chronic liver failure (cirrhosis) Hepatic encephalopathy Continue lactulose scheduled  ESBL E. Coli UTI> completed antibiotics Monitor fever/sepsis work up   [employer] improving Monitor BMET and UOP Replace electrolytes as needed  Hyperglycemia Continue SSI Add tube feeding coverage   Best practice:  Diet: tube feeding Pain/Anxiety/Delirium protocol (if indicated): yes, precedex, RASS goal 0 to -1 VAP protocol (if indicated): yes DVT prophylaxis: heparin in fusion GI prophylaxis: Pantoprazole for stress ulcer prophylaxis Glucose control: as above Mobility: bed rest Code Status: full Family Communication: spoke with aunt on 9/27; called daughter 9/27 and could not get through Disposition: remain I ICU  Labs   CBC: Recent Labs  Lab 07/02/20 0452 07/02/20 1200 07/03/20 0401 07/03/20 0909 07/04/20 0545 07/04/20 1808 07/05/20 0513  WBC 18.8*  --  26.5*  --  35.6* 37.2* 27.9*  NEUTROABS  --   --   --   --   --   --  26.8*  HGB 14.2   < > 13.8 15.0 12.1 12.1 11.8*  HCT 44.1   < > 45.1 44.0 39.5 39.5 38.9  MCV 100.9*  --  104.4*  --  106.5* 105.9* 107.2*  PLT 399  --  405*  --  328 330 307   < > = values in this interval not displayed.    Basic Metabolic Panel: Recent Labs  Lab 06/30/20 0615 06/30/20 0615 07/01/20 3329 07/01/20 0843 07/02/20 0452 07/02/20 0452 07/02/20 1200 07/03/20 0401 07/03/20 0909 07/04/20 0545 07/05/20 0513  NA 136   < > 141   <  > 141   < > 142 142 143 146* 146*  K 4.6   < > 4.0   < > 3.7   < > 4.7 4.1 4.0 3.8 3.6  CL 94*   < > 101  --  102  --   --  105  --  113* 114*  CO2 26   < > 27  --  25  --   --  24  --  23 21*  GLUCOSE 255*   < > 143*  --  133*  --   --  264*  --  228* 228*  BUN 89*   < > 95*  --  93*  --   --  81*  --  82* 76*  CREATININE 2.16*   < > 2.07*  --  1.73*  --   --  1.44*  --  1.44* 1.23*  CALCIUM 8.9   < > 8.9  --  8.8*  --   --  8.9  --  8.3* 8.4*  MG 2.1  --   --   --   --   --   --   --   --   --   --    < > =  values in this interval not displayed.   GFR: Estimated Creatinine Clearance: 51 mL/min (A) (by C-G formula based on SCr of 1.23 mg/dL (H)). Recent Labs  Lab 07/03/20 0401 07/03/20 0908 07/04/20 0545 07/04/20 0927 07/04/20 1808 07/05/20 0513  WBC 26.5*  --  35.6*  --  37.2* 27.9*  LATICACIDVEN  --  2.8*  --  2.1*  --   --     Liver Function Tests: Recent Labs  Lab 07/01/20 0843 07/02/20 0452 07/03/20 0401 07/04/20 0545 07/05/20 0513  AST 29 24 24 29 23   ALT 27 26 33 44 35  ALKPHOS 121 111 110 87 95  BILITOT 1.3* 1.5* 1.9* 1.9* 1.3*  PROT 6.3* 6.1* 6.3* 5.9* 5.8*  ALBUMIN 2.1* 2.1* 2.0* 1.8* 1.6*   No results for input(s): LIPASE, AMYLASE in the last 168 hours. Recent Labs  Lab 06/29/20 1319 06/30/20 0957 07/03/20 0401  AMMONIA 31 41* 43*    ABG    Component Value Date/Time   PHART 7.485 (H) 07/03/2020 0909   PCO2ART 33.2 07/03/2020 0909   PO2ART 116 (H) 07/03/2020 0909   HCO3 25.1 07/03/2020 0909   TCO2 26 07/03/2020 0909   ACIDBASEDEF 11.0 (H) 06/23/2020 1316   O2SAT 75.2 07/05/2020 0513     Coagulation Profile: No results for input(s): INR, PROTIME in the last 168 hours.  Cardiac Enzymes: Recent Labs  Lab 07/03/20 0908  CKTOTAL 22*    HbA1C: Hgb A1c MFr Bld  Date/Time Value Ref Range Status  06/23/2020 05:34 AM 5.5 4.8 - 5.6 % Final    Comment:    (NOTE) Pre diabetes:          5.7%-6.4%  Diabetes:              >6.4%  Glycemic  control for   <7.0% adults with diabetes   07/17/2007 02:46 PM   Final   5.3 (NOTE)   The ADA recommends the following therapeutic goals for glycemic   control related to Hgb A1C measurement:   Goal of Therapy:   < 7.0% Hgb A1C   Action Suggested:  > 8.0% Hgb A1C   Ref:  Diabetes Care, 22, Suppl. 1, 1999    CBG: Recent Labs  Lab 07/04/20 1203 07/04/20 1627 07/04/20 2004 07/04/20 2336 07/05/20 0336  GLUCAP 200* 245* 138* 107* 230*     Critical care time: 35 minutes    Roselie Awkward, MD [facility] Pager: [phone] Cell: [phone] If no response, call [phone]

## 2020-07-05 NOTE — Progress Notes (Signed)
Nutrition Follow-up  DOCUMENTATION CODES:   Severe malnutrition in context of chronic illness  INTERVENTION:   Tube feeding via OG tube: Vital 1.5 at 50 ml/hr Pro-Source 45 mL TID Provides 114 g of protein, 1920 kcals, 912 mL of free water Meets 100% estimated calorie and protein needs   NUTRITION DIAGNOSIS:   Severe Malnutrition related to chronic illness as evidenced by severe muscle depletion, energy intake < or equal to 75% for > or equal to 1 month, moderate fat depletion, edema.  Being addressed via TF   GOAL:   Patient will meet greater than or equal to 90% of their needs  Progressing  MONITOR:   TF tolerance, I & O's  REASON FOR ASSESSMENT:   Consult, Ventilator Enteral/tube feeding initiation and management  ASSESSMENT:   Pt with PMH of PAF, DVT and PE dx 03/2019 on Eliquis, RA, HF, centrilobular emphysema, pulmonary fibrosis on daily prednisone, and chronic venous insufficiency with chronic foot ulcers.Admitted with cardiogenic shock due to severe RV failure/cor pulmonale CTD flare.  9/15 Admitted  Noted plan for MRI brain today  Patient is currently intubated on ventilator support; sedated on precedex MV: 11.8 L/min Temp (24hrs), Avg:99.8 F (37.7 C), Min:99 F (37.2 C), Max:100.8 F (38.2 C)  Tolerating Vital 1.5 at 45 ml/hr via OG tube  Net negative 8.5 L per day; current wt 64 kg. Admit weight 74 kg  Labs: sodium 146 (H), Creatinine 1.23, BUN 76, CBGs 107-245 (ICU goal 140-180) Meds: ss novolog, novolog, lactulose, prednisone,   Diet Order:   Diet Order            Diet NPO time specified  Diet effective now                 EDUCATION NEEDS:   No education needs have been identified at this time  Skin:  Skin Assessment: Reviewed RN Assessment  Last BM:  500 mL via rectal tube  Height:   Ht Readings from Last 1 Encounters:  06/22/20 5\' 11"  (1.803 m)    Weight:   Wt Readings from Last 1 Encounters:  07/05/20 64 kg     Ideal Body Weight:  70.4 kg  BMI:  Body mass index is 19.68 kg/m.  Estimated Nutritional Needs:   Kcal:  1890-2080 kcals  Protein:  100-130 grams  Fluid:  >/= 1.8 L   Kerman Passey MS, RDN, LDN, CNSC Registered Dietitian III Clinical Nutrition RD Pager and On-Call Pager Number Located in [place]

## 2020-07-05 NOTE — Progress Notes (Signed)
Pt transported to MRI and back from 4N 31 on the ventilator without incident.

## 2020-07-05 NOTE — Progress Notes (Signed)
Blood cultures obtained earlier in the shift, Elink gave permission given to pull off PICC, as it was recently placed.

## 2020-07-05 NOTE — Progress Notes (Signed)
ANTICOAGULATION CONSULT NOTE - Follow Up Consult  Pharmacy Consult for heparin to apixaban transition  Indication: history of pulmonary embolus and DVT and history of afib  No Known Allergies  Patient Measurements: Height: 5\' 11"  (180.3 cm) Weight: 64 kg (141 lb 1.5 oz) IBW/kg (Calculated) : 70.8  Vital Signs: Temp: 99 F (37.2 C) (09/28 1000) Temp Source: Bladder (09/28 0800) BP: 113/66 (09/28 1000) Pulse Rate: 60 (09/28 0400)  Labs: Recent Labs    07/03/20 0401 07/03/20 0401 07/03/20 0908 07/03/20 0909 07/04/20 0545 07/04/20 0545 07/04/20 0840 07/04/20 1808 07/05/20 0513  HGB 13.8  --   --    < > 12.1   < >  --  12.1 11.8*  HCT 45.1  --   --    < > 39.5  --   --  39.5 38.9  PLT 405*   < >  --   --  328  --   --  330 307  HEPARINUNFRC 0.26*   < > 0.30  --   --   --  0.47  --  0.18*  CREATININE 1.44*  --   --   --  1.44*  --   --   --  1.23*  CKTOTAL  --   --  22*  --   --   --   --   --   --    < > = values in this interval not displayed.    Estimated Creatinine Clearance: 51 mL/min (A) (by C-G formula based on SCr of 1.23 mg/dL (H)).   Assessment: 59year old Femaleon Apixaban PTA for recent hx DVT/PE in June 2021, also with hx Afib with the last doseof apixaban on9/14 PM. Inpatient, she was started on a heparin drip for anticoagulation. Now, since no pending procedures pharmacy has been consulted to transition from heparin drip to apixaban.   Slight decrease on CBC 11.8; plt 307  Goal of Therapy:  Monitor platelets by anticoagulation protocol: Yes   Plan:  Discontinue heparin drip and heparin levels Restart apixaban 5mg  by mouth twice daily (as prior to admission) at time of heparin discontinuation Monitor s/sx bleeding, CBC, and any surgical plans   Wilson Singer, PharmD PGY1 Pharmacy Resident 07/05/2020 10:41 AM

## 2020-07-06 LAB — GLUCOSE, CAPILLARY
Glucose-Capillary: 100 mg/dL — ABNORMAL HIGH (ref 70–99)
Glucose-Capillary: 121 mg/dL — ABNORMAL HIGH (ref 70–99)
Glucose-Capillary: 139 mg/dL — ABNORMAL HIGH (ref 70–99)
Glucose-Capillary: 145 mg/dL — ABNORMAL HIGH (ref 70–99)
Glucose-Capillary: 153 mg/dL — ABNORMAL HIGH (ref 70–99)
Glucose-Capillary: 169 mg/dL — ABNORMAL HIGH (ref 70–99)

## 2020-07-06 LAB — CBC
HCT: 39 % (ref 36.0–46.0)
Hemoglobin: 11.6 g/dL — ABNORMAL LOW (ref 12.0–15.0)
MCH: 32.4 pg (ref 26.0–34.0)
MCHC: 29.7 g/dL — ABNORMAL LOW (ref 30.0–36.0)
MCV: 108.9 fL — ABNORMAL HIGH (ref 80.0–100.0)
Platelets: 296 10*3/uL (ref 150–400)
RBC: 3.58 MIL/uL — ABNORMAL LOW (ref 3.87–5.11)
RDW: 19.6 % — ABNORMAL HIGH (ref 11.5–15.5)
WBC: 23 10*3/uL — ABNORMAL HIGH (ref 4.0–10.5)
nRBC: 0 % (ref 0.0–0.2)

## 2020-07-06 LAB — COOXEMETRY PANEL
Carboxyhemoglobin: 1.2 % (ref 0.5–1.5)
Methemoglobin: 0.7 % (ref 0.0–1.5)
O2 Saturation: 65 %
Total hemoglobin: 11.7 g/dL — ABNORMAL LOW (ref 12.0–16.0)

## 2020-07-06 LAB — COMPREHENSIVE METABOLIC PANEL
ALT: 33 U/L (ref 0–44)
AST: 26 U/L (ref 15–41)
Albumin: 1.5 g/dL — ABNORMAL LOW (ref 3.5–5.0)
Alkaline Phosphatase: 89 U/L (ref 38–126)
Anion gap: 8 (ref 5–15)
BUN: 81 mg/dL — ABNORMAL HIGH (ref 6–20)
CO2: 25 mmol/L (ref 22–32)
Calcium: 8.5 mg/dL — ABNORMAL LOW (ref 8.9–10.3)
Chloride: 116 mmol/L — ABNORMAL HIGH (ref 98–111)
Creatinine, Ser: 1.09 mg/dL — ABNORMAL HIGH (ref 0.44–1.00)
GFR calc Af Amer: >60 mL/min (ref >60)
GFR calc non Af Amer: 56 mL/min — ABNORMAL LOW (ref >60)
Glucose, Bld: 85 mg/dL (ref 70–99)
Potassium: 3.6 mmol/L (ref 3.5–5.1)
Sodium: 149 mmol/L — ABNORMAL HIGH (ref 135–145)
Total Bilirubin: 1 mg/dL (ref 0.3–1.2)
Total Protein: 5.3 g/dL — ABNORMAL LOW (ref 6.5–8.1)

## 2020-07-06 LAB — CULTURE, RESPIRATORY W GRAM STAIN

## 2020-07-06 MED ORDER — SODIUM CHLORIDE 0.9 % IV SOLN
2.0000 g | Freq: Three times a day (TID) | INTRAVENOUS | Status: AC
  Administered 2020-07-06 – 2020-07-12 (×20): 2 g via INTRAVENOUS
  Filled 2020-07-06 (×23): qty 2

## 2020-07-06 MED ORDER — FUROSEMIDE 10 MG/ML IJ SOLN
40.0000 mg | Freq: Once | INTRAMUSCULAR | Status: AC
  Administered 2020-07-06: 40 mg via INTRAVENOUS
  Filled 2020-07-06: qty 4

## 2020-07-06 NOTE — Progress Notes (Signed)
Pharmacy Antibiotic Note  Carolyn Proctor is a 59 y.o. female admitted on 06/22/2020 with pneumonia, sepsis and UTI.  There was concern for development of pneumonia, and patient was started empirically on vancomycin and cefepime. Once TA culture grew GNR, vancomycin was discontinued. Now, pharmacy has been consulted for meropenem dosing. This is day 1 of meropenem. Patient has been previously treated with 6 days of zosyn for ESBL Ecoli UTI. Pt now has ESBL EColi in tracheal aspirate culture. Pt is afebrile with elevated but down trending WBC of 23.0.   Plan: Discontinue cefepime meropenem 2g IV q8h  Monitor clinical progress, CBC, micro data, renal function, and fever trends.  Height: 5\' 11"  (180.3 cm) Weight: 64 kg (141 lb 1.5 oz) IBW/kg (Calculated) : 70.8  Temp (24hrs), Avg:99.6 F (37.6 C), Min:99.1 F (37.3 C), Max:100.2 F (37.9 C)  Recent Labs  Lab 07/02/20 0452 07/02/20 0452 07/03/20 0401 07/03/20 0908 07/04/20 0545 07/04/20 0927 07/04/20 1808 07/05/20 0513 07/06/20 0532  WBC 18.8*   < > 26.5*  --  35.6*  --  37.2* 27.9* 23.0*  CREATININE 1.73*  --  1.44*  --  1.44*  --   --  1.23* 1.09*  LATICACIDVEN  --   --   --  2.8*  --  2.1*  --   --   --    < > = values in this interval not displayed.    Estimated Creatinine Clearance: 57.5 mL/min (A) (by C-G formula based on SCr of 1.09 mg/dL (H)).    No Known Allergies  Antimicrobials this admission: Vanc 9/15 x 1; restart 9/16 >> 9/17; 9/27>9/28 Zosyn 9/15 x 1; restart 9/16 >> 9/20 Cefepime 9/27>9/29 Meropenem 9/29>  9/15 BCx >> 2/4 Strep mitis/oralis (likely contaminant) 9/15 UCx >> 100k col ESBL Ecoli (s-zosyn) 9/16 RCx (trach aspirate) - negative 9/16 MRSA- negative  9/26: Bcx : ng 2 days 9/26: Rcx: E coli, ESBL   Thank you for allowing pharmacy to be a part of this patient's care.  Wilson Singer, PharmD PGY1 Pharmacy Resident 07/06/2020 11:33 AM

## 2020-07-06 NOTE — Progress Notes (Signed)
Advanced Heart Failure Rounding Note   Subjective:    Awake on vent. Weaning. Now on PS. Doing well.   CVP 12. Tmax 100.2. Co-ox 65% (off milrinone). Remains in NSR.   Brain MRI showed diffuse pachymeningeal dural thickening and enhancement  Objective:   Weight Range:  Vital Signs:   Temp:  [99.1 F (37.3 C)-100.2 F (37.9 C)] 99.7 F (37.6 C) (09/29 1000) Pulse Rate:  [62-147] 147 (09/29 1000) Resp:  [18-35] 30 (09/29 1000) BP: (100-123)/(59-72) 122/67 (09/29 1000) SpO2:  [89 %-100 %] 89 % (09/29 1000) Arterial Line BP: (140-170)/(58-71) 170/71 (09/29 1000) FiO2 (%):  [40 %] 40 % (09/29 0817) Last BM Date: 07/06/20 (flexiseal with current output)  Weight change: Filed Weights   07/03/20 0422 07/04/20 0500 07/05/20 0500  Weight: 65.7 kg 63.2 kg 64 kg    Intake/Output:   Intake/Output Summary (Last 24 hours) at 07/06/2020 1108 Last data filed at 07/06/2020 0928 Gross per 24 hour  Intake 741.36 ml  Output 2875 ml  Net -2133.64 ml     Physical Exam: General:  Awake on vent  HEENT: normal + ETT Neck: supple. JVP 12. Carotids 2+ bilat; no bruits. No lymphadenopathy or thryomegaly appreciated. Cor: PMI nondisplaced. Regular rate & rhythm. 2/6 TR Lungs: clear Abdomen: soft, nontender, nondistended. No hepatosplenomegaly. No bruits or masses. Good bowel sounds. Extremities: no cyanosis, clubbing, rash, edema + TED Neuro: alert & orientedx3, cranial nerves grossly intact. moves all 4 extremities w/o difficulty. Affect pleasant   Telemetry: NSR 60s Personally reviewed   Labs: Basic Metabolic Panel: Recent Labs  Lab 06/30/20 0615 07/01/20 0843 07/02/20 0452 07/02/20 1200 07/03/20 0401 07/03/20 0401 07/03/20 0909 07/04/20 0545 07/05/20 0513 07/06/20 0532  NA 136   < > 141   < > 142  --  143 146* 146* 149*  K 4.6   < > 3.7   < > 4.1  --  4.0 3.8 3.6 3.6  CL 94*   < > 102  --  105  --   --  113* 114* 116*  CO2 26   < > 25  --  24  --   --  23 21* 25    GLUCOSE 255*   < > 133*  --  264*  --   --  228* 228* 85  BUN 89*   < > 93*  --  81*  --   --  82* 76* 81*  CREATININE 2.16*   < > 1.73*  --  1.44*  --   --  1.44* 1.23* 1.09*  CALCIUM 8.9   < > 8.8*  --  8.9   < >  --  8.3* 8.4* 8.5*  MG 2.1  --   --   --   --   --   --   --   --   --    < > = values in this interval not displayed.    Liver Function Tests: Recent Labs  Lab 07/02/20 0452 07/03/20 0401 07/04/20 0545 07/05/20 0513 07/06/20 0532  AST _0 ALT 26 33 44 35 33  ALKPHOS 111 110 87 95 89  BILITOT 1.5* 1.9* 1.9* 1.3* 1.0  PROT 6.1* 6.3* 5.9* 5.8* 5.3*  ALBUMIN 2.1* 2.0* 1.8* 1.6* 1.5*   No results for input(s): LIPASE, AMYLASE in the last 168 hours. Recent Labs  Lab 06/29/20 1319 06/30/20 0957 07/03/20 0401  AMMONIA 31 41* 43*    CBC: Recent  Labs  Lab 07/03/20 0401 07/03/20 0401 07/03/20 0909 07/04/20 0545 07/04/20 1808 07/05/20 0513 07/06/20 0532  WBC 26.5*  --   --  35.6* 37.2* 27.9* 23.0*  NEUTROABS  --   --   --   --   --  26.8*  --   HGB 13.8   < > 15.0 12.1 12.1 11.8* 11.6*  HCT 45.1   < > 44.0 39.5 39.5 38.9 39.0  MCV 104.4*  --   --  106.5* 105.9* 107.2* 108.9*  PLT 405*  --   --  328 330 307 296   < > = values in this interval not displayed.    Cardiac Enzymes: Recent Labs  Lab 07/03/20 0908  CKTOTAL 22*    BNP: BNP (last 3 results) No results for input(s): BNP in the last 8760 hours.  ProBNP (last 3 results) No results for input(s): PROBNP in the last 8760 hours.    Other results:  Imaging: MR BRAIN W WO CONTRAST  Addendum Date: 07/05/2020   ADDENDUM REPORT: 07/05/2020 17:47 ADDENDUM: These results were called by telephone at the time of interpretation on 07/05/2020 at 5:47 pm to provider Dr. Wilnette Kales, who verbally acknowledged these results. Electronically Signed   By: Kellie Simmering DO   On: 07/05/2020 17:47   Result Date: 07/05/2020 CLINICAL DATA:  Provided history: Mental status change, unknown cause. EXAM: MRI  HEAD WITHOUT AND WITH CONTRAST TECHNIQUE: Multiplanar, multiecho pulse sequences of the brain and surrounding structures were obtained without and with intravenous contrast. CONTRAST:  6.37m GADAVIST GADOBUTROL 1 MMOL/ML IV SOLN COMPARISON:  Head CT 07/01/2020, brain MRI 06/25/2020. FINDINGS: Brain: Stable, mild generalized cerebral atrophy. New from the prior brain MRI of 06/25/2020, there is thin T2/FLAIR hyperintensity overlying the bilateral cerebral convexities measuring up to 3 mm in greatest thickness. On the postcontrast imaging, this appears to predominantly corresponding diffuse pachymeningeal dural thickening and enhancement. However, small bilateral subdural effusions are difficult to exclude. Also new from the prior MRI, there is a 9 mm focus of T1 hyperintensity along the right temporal occipital lobes, which may reflect a small focus of subacute extra-axial blood products (series 16, image 15). Stable mild multifocal T2/FLAIR hyperintensity within the cerebral white matter which is nonspecific. There is no acute infarct. No evidence of intracranial mass. No midline shift. Vascular: Expected proximal arterial flow voids. Skull and upper cervical spine: No focal marrow lesion. Sinuses/Orbits: Visualized orbits show no acute finding. No significant paranasal sinus disease at the imaged levels. Bilateral mastoid effusions. IMPRESSION: New from the brain MRI of 06/25/2020, there is T2/FLAIR hyperintense signal abnormality overlying the bilateral cerebral convexities measuring up to 3 mm in greatest thickness. On the postcontrast imaging, this appears to predominantly correspond with diffuse pachymeningeal dural thickening and enhancement. However, small bilateral subdural effusions are difficult to exclude. Diffuse pachymeningeal dural thickening and enhancement is a nonspecific finding with broad differential considerations, but is most commonly related to recent lumbar puncture or craniospinal  hypotension. Clinical correlation is recommended. Subcentimeter extra-axial focus of T1 hyperintensity along the right temporal occipital lobe, new as compared to the prior MRI. This may reflect a small focus of subacute blood products. Unchanged mild generalized cerebral atrophy and mild nonspecific cerebral white matter disease. Electronically Signed: By: KKellie SimmeringDO On: 07/05/2020 17:25     Medications:     Scheduled Medications: . amiodarone  200 mg Per Tube Daily  . apixaban  5 mg Per Tube BID  . chlorhexidine gluconate (MEDLINE KIT)  15 mL Mouth Rinse BID  . Chlorhexidine Gluconate Cloth  6 each Topical Q0600  . dextrose  50 mL Intravenous Once  . feeding supplement (PROSource TF)  45 mL Per Tube TID  . influenza vac split quadrivalent PF  0.5 mL Intramuscular Tomorrow-1000  . insulin aspart  0-15 Units Subcutaneous Q4H  . insulin aspart  5 Units Subcutaneous Q4H  . lactulose  30 g Per Tube BID  . levETIRAcetam  500 mg Per Tube BID  . mouth rinse  15 mL Mouth Rinse 10 times per day  . pantoprazole sodium  40 mg Per Tube Daily  . predniSONE  40 mg Per Tube Q breakfast  . sodium chloride flush  10-40 mL Intracatheter Q12H    Infusions: . sodium chloride    . sodium chloride    . ceFEPime (MAXIPIME) IV 2 g (07/06/20 0928)  . dexmedetomidine (PRECEDEX) IV infusion 0.8 mcg/kg/hr (07/06/20 0928)  . feeding supplement (VITAL 1.5 CAL) 50 mL/hr at 07/05/20 1900  . fentaNYL infusion INTRAVENOUS Stopped (07/03/20 1512)  . norepinephrine (LEVOPHED) Adult infusion Stopped (07/04/20 1744)    PRN Medications: Place/Maintain arterial line **AND** sodium chloride, Place/Maintain arterial line **AND** sodium chloride, acetaminophen (TYLENOL) oral liquid 160 mg/5 mL, docusate, fentaNYL, midazolam, polyethylene glycol, sodium chloride flush    Plan/Discussion:    1. Shock  - initially cardiogenic shock due to severe RV failure/cor pulmonale  - Echo 06/22/20 Echo LV small compressed  from RV EF 45-50% RV massively dilated and severely hypokinetic. Severe TR RVSP 76 Severe D-sign. Moderate pericardial effusion  - improved with milrinone and diuresis - On 9/27 developed component of septic shock. BCx pending. E coli (ESBL) in sputum - WBC improving on cefepime/vanc. ESBL resistant to cefepime. Switch to mero - suspect fevers may also be related to vasculitic flare with peripheral vasoconstriction   2. [place] RV/failure - RH cath in 12/20 with mild PAH with normal RV function. - PFTs much better than I expected based on CT chest. DLCO markedly reduced - Echo 06/22/20 Echo LV small compressed from RV EF 45-50% RV massively dilated and severely hypokinetic. Severe TR RVSP 76 Severe D-sign. Moderate pericardial effusion  - Milrinone stopped 9/26. Co-ox remains ok at 65% - Volume status remains elevated. CVP 12 Lasix 40 IV x 1 today . Start torsemide tomorrow -  RHC after extubation either tomorrow or Monday   3. Severe encephalopathy - etiology unclear - repeat head CT unrevealing.  - brain MRI on 9/18 with no evidence vasculitis (has received steroids). Repeat brain MRI with findings as above. (unclear significance)  - improved with increasing steroids   4. Acute hypoxic respiratory failure - multifactorial including PAH, ILD, large R effusion, pulmonary edema and possible aspiration  - Tracheal aspirate now with ESBL E.coli. Switching to meropenum - improving. Hopefully can be extubated today or tomorrow. D/w Dr. Lake Bells at bedside  5. 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 Down to 12.  - Remains on lactulose   6. CTD with h/o RA and SLE - Case d/w with Dr. [place] Bells. She has had rapid progression of RV failure along with mental status changes and lung infiltrates in setting of known CTD. Several markers elevated- - Agree that this is mostly likely CTD flare.  - Steroids have been increased.  Would continue for extended course - Dr. Sherrin Daisy will f/u with her outpatient Rheumatologist  7. DVT/PE -  diagnosed 6/21 at [place] this admit. Negative for acute PE - back on Eliquis  8. AFL/atrial tach with runs NSVT  - in NSR - Keep amio at 200daily  9. Hypernatremia -0 sodium 146 -> 149  CRITICAL CARE Performed by: Glori Bickers  Total critical care time: 35 minutes  Critical care time was exclusive of separately billable procedures and treating other patients.  Critical care was necessary to treat or prevent imminent or life-threatening deterioration.  Critical care was time spent personally by me (independent of midlevel providers or residents) on the following activities: development of treatment plan with patient and/or surrogate as well as nursing, discussions with consultants, evaluation of patient's response to treatment, examination of patient, obtaining history from patient or surrogate, ordering and performing treatments and interventions, ordering and review of laboratory studies, ordering and review of radiographic studies, pulse oximetry and re-evaluation of patient's condition.    Length of Stay: [place], MD 07/06/2020, 11:08 AM  Advanced Heart Failure Team Pager [phone] (M-F; 7a - 4p)  Please contact [facility] for night-coverage after hours (4p -7a ) and weekends on [email or web address]

## 2020-07-06 NOTE — Progress Notes (Signed)
NAME:  Carolyn Proctor, MRN:  [id number], DOB:  April 27, 1961, LOS: 27 ADMISSION DATE:  06/22/2020, CONSULTATION DATE:  9/15 REFERRING MD:  Tyrone Nine, CHIEF COMPLAINT:  Confusion, refractory hypoglycemia   Brief History   53 yoF at home who complained to family she felt like she was having a stroke, then developed left facial droop and went unresponsive.  Required BVM by EMS and noted to be hypoglycemic and hypotensive.  Intubated in ER for airway protection.  CTH negative.  Refractory hypoglycemia despite multiple D50 amps and required a drip.  PCCM called to admit.   Past Medical History  PAF, DVT, PE on Eliquis HFpEF CKD stage III RA Hypercoagulable state (increased protein S antigen, decreased protein C activity, and decreased Antithrombin III) Centrilobular emphysema and pulmonary fibrosis- non smoker Chronic venous insufficiency w/ chronic foot ulcers  Diverticulitis   Significant Hospital Events   9/15 admitted/ intubated ~pulse dose steroids, diuresis, cardiogenic shock support 9/25-on full vent support, encephalopathy precluding extubation 9/26-not weaning, encephalopathic, dysautonomia, off milrinonoe  Consults:  Advanced heart failure  Procedures:  9/15 ETT >> 9/15 R IJ TL CVC >>9/27 9/15 OGT/ foley 9/27 PICC >   Significant Diagnostic Tests:  9/15 CTH >> negative 9/15 CTA chest/ abd/ pelvis >> significant dilatation of RA and RV consistent with R heart failure; moderate R and small L pleural effusions with associated atelectasis of both lower lobes; underlying chronic cystic and fibrotic lung disease; severe anasarca; nodular contour of liver suggestive of possible cirrhosis 9/15 Echo> LVEF 35-40%, mod decreased LV function, RVSP 75, severely reduced RV systolic function; R to left shunt noted, suggestive of PFO, RA severely dilated, mod pericardial effusion 9/15 CTA head and neck >> no significant carotid or vertebral artery stenosis in neck, no LVO, right jugular central  venous catheter with probable surrounding hematoma 9/18 MR BRAIN>> no acute intracranial abnormality 9/28 MRI brain > diffuse pachymeningeal enhancement, bilateral cerebral convexities, sub centimeter extra axial focus of T1 hyperintensity along R temporal occiptal lobe, new  Micro Data:  9/15 BCx2 central line >> NG5D 9/15 BCx2 PIV >> Strept mitis 9/15 SARS2 >> neg 9/15 UC >> ESBL E coli 9/16 Trach asp cult >> Normal flora 9/27 blood >  9/27 resp > e coli ESBL   Antimicrobials:  9/15 vanc >> 9/17 9/15 zosyn >> 9/20   9/27 vanc > 9/28 9/27 cefepime > 9/29 9/29 meropenem >   Interim history/subjective:   Weaned on vent yesterday Can't lift head off pillow More awake  Objective   Blood pressure 117/67, pulse 62, temperature 99.5 F (37.5 C), resp. rate (!) 25, height 5\' 11"  (1.803 m), weight 64 kg, last menstrual period 02/19/2013, SpO2 94 %. CVP:  [2 mmHg-14 mmHg] 7 mmHg  Vent Mode: PRVC FiO2 (%):  [40 %] 40 % Set Rate:  [20 bmp] 20 bmp Vt Set:  [500 mL] 500 mL PEEP:  [5 cmH20] 5 cmH20 Pressure Support:  [10 cmH20] 10 cmH20 Plateau Pressure:  [17 cmH20-21 cmH20] 17 cmH20   Intake/Output Summary (Last 24 hours) at 07/06/2020 0723 Last data filed at 07/06/2020 0600 Gross per 24 hour  Intake 1175.58 ml  Output 2650 ml  Net -1474.42 ml   Filed Weights   07/03/20 0422 07/04/20 0500 07/05/20 0500  Weight: 65.7 kg 63.2 kg 64 kg    Examination:  General:  In bed on vent HENT: NCAT ETT in place PULM: CTA B, vent supported breathing CV: RRR, no mgr GI: BS+, soft, nontender MSK: normal  bulk and tone Neuro:awake, nods head to questions, very weak, can't lift arms or head off of bed    Resolved Hospital Problem list   ESBL E. 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 Continue prednisone 40mg  daily for now Will discuss case with her rheumatologist  Cardiogenic shock/severe pulmonary hypertension WHO group 1, 3> now off milrinone as of 9/26 Hold off milrinone Lasix x1 today Needs RHC  Acute encephalopathy> lupus flare? Not sure of significance of MRI findings F/u neurology recommendations  Acute pulmonary embolism diagnosed June 2021 Continue eliquis  E coli HCAP, esbl Change antibiotics to meropenem, plan 5 days  Acute hypoxemic respiratory failure > improved? CTD-ILD with possible acute pneumonitis Continue prednisone Continue pressure support ventilation VAP prevention Daily WUA/SBT  Acute on chronic liver failure (cirrhosis) Hepatic encephalopathy Continue lactulose  AKI> improving Monitor BMET and UOP Replace electrolytes as needed  Hyperglycemia Continue ssi and tube feeding coverage   Best practice:  Diet: tube feeding Pain/Anxiety/Delirium protocol (if indicated): yes, precedex, RASS goal 0 to -1 VAP protocol (if indicated): yes DVT prophylaxis: heparin in fusion GI prophylaxis: Pantoprazole for stress ulcer prophylaxis Glucose control: as above Mobility: bed rest Code Status: full Family Communication: spoke with aunt on 9/27, no answer on 9/28 when I called Disposition: remain in ICU  Labs   CBC: Recent Labs  Lab 07/03/20 0401 07/03/20 0401 07/03/20 0909 07/04/20 0545 07/04/20 1808 07/05/20 0513 07/06/20 0532  WBC 26.5*  --   --  35.6* 37.2* 27.9* 23.0*  NEUTROABS  --   --   --   --   --  26.8*  --   HGB 13.8   < > 15.0 12.1 12.1 11.8* 11.6*  HCT 45.1   < > 44.0 39.5 39.5 38.9 39.0  MCV 104.4*  --   --  106.5* 105.9* 107.2* 108.9*  PLT 405*  --   --  328 330 307 296   < > = values in this interval not displayed.    Basic Metabolic Panel: Recent Labs  Lab 06/30/20 0615  07/01/20 0843 07/02/20 0452 07/02/20 1200 07/03/20 0401 07/03/20 0909 07/04/20 0545 07/05/20 0513 07/06/20 0532  NA 136   < > 141   < > 142 143 146* 146* 149*  K 4.6   < > 3.7   < > 4.1 4.0 3.8 3.6 3.6  CL 94*   < > 102  --  105  --  113* 114* 116*  CO2 26   < > 25  --  24  --  23 21* 25  GLUCOSE 255*   < > 133*  --  264*  --  228* 228* 85  BUN 89*   < > 93*  --  81*  --  82* 76* 81*  CREATININE 2.16*   < > 1.73*  --  1.44*  --  1.44* 1.23* 1.09*  CALCIUM 8.9   < > 8.8*  --  8.9  --  8.3* 8.4* 8.5*  MG 2.1  --   --   --   --   --   --   --   --    < > = values in this interval not displayed.   GFR: Estimated Creatinine  Clearance: 57.5 mL/min (A) (by C-G formula based on SCr of 1.09 mg/dL (H)). Recent Labs  Lab 07/03/20 0401 07/03/20 0908 07/04/20 0545 07/04/20 0927 07/04/20 1808 07/05/20 0513 07/06/20 0532  WBC   < >  --  35.6*  --  37.2* 27.9* 23.0*  LATICACIDVEN  --  2.8*  --  2.1*  --   --   --    < > = values in this interval not displayed.    Liver Function Tests: Recent Labs  Lab 07/02/20 0452 07/03/20 0401 07/04/20 0545 07/05/20 0513 07/06/20 0532  AST 24 24 29 23 26   ALT 26 33 44 35 33  ALKPHOS 111 110 87 95 89  BILITOT 1.5* 1.9* 1.9* 1.3* 1.0  PROT 6.1* 6.3* 5.9* 5.8* 5.3*  ALBUMIN 2.1* 2.0* 1.8* 1.6* 1.5*   No results for input(s): LIPASE, AMYLASE in the last 168 hours. Recent Labs  Lab 06/29/20 1319 06/30/20 0957 07/03/20 0401  AMMONIA 31 41* 43*    ABG    Component Value Date/Time   PHART 7.485 (H) 07/03/2020 0909   PCO2ART 33.2 07/03/2020 0909   PO2ART 116 (H) 07/03/2020 0909   HCO3 25.1 07/03/2020 0909   TCO2 26 07/03/2020 0909   ACIDBASEDEF 11.0 (H) 06/23/2020 1316   O2SAT 65.0 07/06/2020 0532     Coagulation Profile: No results for input(s): INR, PROTIME in the last 168 hours.  Cardiac Enzymes: Recent Labs  Lab 07/03/20 0908  CKTOTAL 22*    HbA1C: Hgb A1c MFr Bld  Date/Time Value Ref Range Status  06/23/2020 05:34 AM  5.5 4.8 - 5.6 % Final    Comment:    (NOTE) Pre diabetes:          5.7%-6.4%  Diabetes:              >6.4%  Glycemic control for   <7.0% adults with diabetes   07/17/2007 02:46 PM   Final   5.3 (NOTE)   The ADA recommends the following therapeutic goals for glycemic   control related to Hgb A1C measurement:   Goal of Therapy:   < 7.0% Hgb A1C   Action Suggested:  > 8.0% Hgb A1C   Ref:  Diabetes Care, 22, Suppl. 1, 1999    CBG: Recent Labs  Lab 07/05/20 1203 07/05/20 1535 07/05/20 1958 07/06/20 0027 07/06/20 0304  GLUCAP 255* 189* 100* 139* 121*     Critical care time: 35 minutes    Roselie Awkward, MD [facility] Pager: [phone] Cell: [phone] If no response, call [phone]

## 2020-07-06 NOTE — Progress Notes (Signed)
[facility]  Case discussed with Dr. Posey Pronto who has seen her previously and coordinated her rituximab infusions previously (last was in November 2020).  He feels that she has a vasculitis picture given the elevated PR-3, active urinary sediment and elevated RNP with her baseline RA and SLE.  He says that the exact diagnosis is uncertain given the lack of renal biopsy to confirm the pattern of vasculitis.  This picture could fit with a flare of her underlying SLE or RA or this could be consistent with mixed connective tissue disease.  Regardless given her underlying infections (currently ESBL) we can't use more aggressive immunosuppression other than prednisone at this time.  He would be happy to see her in consultation again after hospital discharge.  Roselie Awkward, MD [facility] Pager: [phone] Cell: [phone] If no response, call [phone]

## 2020-07-07 ENCOUNTER — Inpatient Hospital Stay (HOSPITAL_COMMUNITY): Payer: BC Managed Care – PPO

## 2020-07-07 LAB — GLUCOSE, CAPILLARY
Glucose-Capillary: 112 mg/dL — ABNORMAL HIGH (ref 70–99)
Glucose-Capillary: 121 mg/dL — ABNORMAL HIGH (ref 70–99)
Glucose-Capillary: 134 mg/dL — ABNORMAL HIGH (ref 70–99)
Glucose-Capillary: 149 mg/dL — ABNORMAL HIGH (ref 70–99)
Glucose-Capillary: 160 mg/dL — ABNORMAL HIGH (ref 70–99)
Glucose-Capillary: 41 mg/dL — CL (ref 70–99)

## 2020-07-07 LAB — COMPREHENSIVE METABOLIC PANEL
ALT: 43 U/L (ref 0–44)
AST: 31 U/L (ref 15–41)
Albumin: 1.5 g/dL — ABNORMAL LOW (ref 3.5–5.0)
Alkaline Phosphatase: 126 U/L (ref 38–126)
Anion gap: 9 (ref 5–15)
BUN: 93 mg/dL — ABNORMAL HIGH (ref 6–20)
CO2: 23 mmol/L (ref 22–32)
Calcium: 8.8 mg/dL — ABNORMAL LOW (ref 8.9–10.3)
Chloride: 119 mmol/L — ABNORMAL HIGH (ref 98–111)
Creatinine, Ser: 1.13 mg/dL — ABNORMAL HIGH (ref 0.44–1.00)
GFR calc Af Amer: >60 mL/min (ref >60)
GFR calc non Af Amer: 54 mL/min — ABNORMAL LOW (ref >60)
Glucose, Bld: 131 mg/dL — ABNORMAL HIGH (ref 70–99)
Potassium: 3.8 mmol/L (ref 3.5–5.1)
Sodium: 151 mmol/L — ABNORMAL HIGH (ref 135–145)
Total Bilirubin: 1 mg/dL (ref 0.3–1.2)
Total Protein: 5.7 g/dL — ABNORMAL LOW (ref 6.5–8.1)

## 2020-07-07 LAB — COOXEMETRY PANEL
Carboxyhemoglobin: 1.2 % (ref 0.5–1.5)
Methemoglobin: 0.9 % (ref 0.0–1.5)
O2 Saturation: 64.5 %
Total hemoglobin: 11.5 g/dL — ABNORMAL LOW (ref 12.0–16.0)

## 2020-07-07 LAB — CBC WITH DIFFERENTIAL/PLATELET
Abs Immature Granulocytes: 0.4 10*3/uL — ABNORMAL HIGH (ref 0.00–0.07)
Basophils Absolute: 0 10*3/uL (ref 0.0–0.1)
Basophils Relative: 0 %
Eosinophils Absolute: 0 10*3/uL (ref 0.0–0.5)
Eosinophils Relative: 0 %
HCT: 38.5 % (ref 36.0–46.0)
Hemoglobin: 11.7 g/dL — ABNORMAL LOW (ref 12.0–15.0)
Immature Granulocytes: 2 %
Lymphocytes Relative: 2 %
Lymphs Abs: 0.3 10*3/uL — ABNORMAL LOW (ref 0.7–4.0)
MCH: 32 pg (ref 26.0–34.0)
MCHC: 30.4 g/dL (ref 30.0–36.0)
MCV: 105.2 fL — ABNORMAL HIGH (ref 80.0–100.0)
Monocytes Absolute: 0.6 10*3/uL (ref 0.1–1.0)
Monocytes Relative: 3 %
Neutro Abs: 19.1 10*3/uL — ABNORMAL HIGH (ref 1.7–7.7)
Neutrophils Relative %: 93 %
Platelets: 303 10*3/uL (ref 150–400)
RBC: 3.66 MIL/uL — ABNORMAL LOW (ref 3.87–5.11)
RDW: 19.7 % — ABNORMAL HIGH (ref 11.5–15.5)
WBC: 20.5 10*3/uL — ABNORMAL HIGH (ref 4.0–10.5)
nRBC: 0.1 % (ref 0.0–0.2)

## 2020-07-07 MED ORDER — APIXABAN 5 MG PO TABS
5.0000 mg | ORAL_TABLET | Freq: Two times a day (BID) | ORAL | Status: AC
  Administered 2020-07-07 – 2020-07-08 (×3): 5 mg
  Filled 2020-07-07 (×3): qty 1

## 2020-07-07 MED ORDER — FREE WATER
200.0000 mL | Status: DC
  Administered 2020-07-07 – 2020-07-12 (×29): 200 mL

## 2020-07-07 NOTE — Progress Notes (Signed)
NAME:  Carolyn Proctor, MRN:  [id number], DOB:  1960/10/14, LOS: 44 ADMISSION DATE:  06/22/2020, CONSULTATION DATE:  9/15 REFERRING MD:  Tyrone Nine, CHIEF COMPLAINT:  Confusion, refractory hypoglycemia   Brief History   28 yoF at home who complained to family she felt like she was having a stroke, then developed left facial droop and went unresponsive.  Required BVM by EMS and noted to be hypoglycemic and hypotensive.  Intubated in ER for airway protection.  CTH negative.  Refractory hypoglycemia despite multiple D50 amps and required a drip.  PCCM called to admit.   Past Medical History  PAF, DVT, PE on Eliquis HFpEF CKD stage III RA Hypercoagulable state (increased protein S antigen, decreased protein C activity, and decreased Antithrombin III) Centrilobular emphysema and pulmonary fibrosis- non smoker Chronic venous insufficiency w/ chronic foot ulcers  Diverticulitis   Significant Hospital Events   9/15 admitted/ intubated ~pulse dose steroids, diuresis, cardiogenic shock support 9/25-on full vent support, encephalopathy precluding extubation 9/26-not weaning, encephalopathic, dysautonomia, off milrinonoe  Consults:  Advanced heart failure  Procedures:  9/15 ETT >> 9/15 R IJ TL CVC >>9/27 9/15 OGT/ foley 9/27 PICC >   Significant Diagnostic Tests:  9/15 CTH >> negative 9/15 CTA chest/ abd/ pelvis >> significant dilatation of RA and RV consistent with R heart failure; moderate R and small L pleural effusions with associated atelectasis of both lower lobes; underlying chronic cystic and fibrotic lung disease; severe anasarca; nodular contour of liver suggestive of possible cirrhosis 9/15 Echo> LVEF 35-40%, mod decreased LV function, RVSP 75, severely reduced RV systolic function; R to left shunt noted, suggestive of PFO, RA severely dilated, mod pericardial effusion 9/15 CTA head and neck >> no significant carotid or vertebral artery stenosis in neck, no LVO, right jugular central  venous catheter with probable surrounding hematoma 9/18 MR BRAIN>> no acute intracranial abnormality 9/28 MRI brain > diffuse pachymeningeal enhancement, bilateral cerebral convexities, sub centimeter extra axial focus of T1 hyperintensity along R temporal occiptal lobe, new  Micro Data:  9/15 BCx2 central line >> NG5D 9/15 BCx2 PIV >> Strept mitis 9/15 SARS2 >> neg 9/15 UC >> ESBL E coli 9/16 Trach asp cult >> Normal flora 9/27 blood >  9/27 resp > e coli ESBL   Antimicrobials:  9/15 vanc >> 9/17 9/15 zosyn >> 9/20   9/27 vanc > 9/28 9/27 cefepime > 9/29 9/29 meropenem >   Interim history/subjective:   Very weak Can wean on ventilator all day but can't lift head off pillow  Objective   Blood pressure 102/61, pulse 73, temperature 99.1 F (37.3 C), resp. rate (!) 22, height 5\' 11"  (1.803 m), weight 64 kg, last menstrual period 02/19/2013, SpO2 93 %. CVP:  [2 mmHg-14 mmHg] 4 mmHg  Vent Mode: PRVC FiO2 (%):  [40 %] 40 % Set Rate:  [20 bmp] 20 bmp Vt Set:  [500 mL] 500 mL PEEP:  [5 cmH20] 5 cmH20 Pressure Support:  [10 cmH20] 10 cmH20 Plateau Pressure:  [22 QQP61-95 cmH20] 22 cmH20   Intake/Output Summary (Last 24 hours) at 07/07/2020 0736 Last data filed at 07/07/2020 0700 Gross per 24 hour  Intake 1919.51 ml  Output 2425 ml  Net -505.49 ml   Filed Weights   07/03/20 0422 07/04/20 0500 07/05/20 0500  Weight: 65.7 kg 63.2 kg 64 kg    Examination:  General:  In bed on vent HENT: NCAT ETT in place PULM: CTA B, vent supported breathing CV: RRR, no mgr GI: BS+, soft,  nontender MSK: normal bulk and tone Neuro: awake, following commands but can't lift head off pillow     Resolved Hospital Problem list   ESBL E. 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 Continue prednisone 40mg  daily Too weak and has recurrent infections she cannot tolerate more immunosuppression  She will need to follow up with Dr. Posey Pronto with [facility]   Cardiogenic shock/severe pulmonary hypertension WHO group 1, 3> now off milrinone as of 9/26  Hold off milrinone Lasix x1 today Needs RHC  Acute encephalopathy> lupus flare? Not sure of significance of MRI findings F/u neurology recommendations  Acute pulmonary embolism diagnosed June 2021  Continue eliquis  E coli HCAP, esbl  Change antibiotics to meropenem, plan 5 days  Acute hypoxemic respiratory failure > improved? CTD-ILD with possible acute pneumonitis  Continue prednisone Continue pressure support ventilation VAP prevention Daily WUA/SBT  Acute on chronic liver failure (cirrhosis)  Hepatic encephalopathy Continue lactulose  AKI> improving  Monitor BMET and UOP Replace electrolytes as needed  Hyperglycemia  Continue ssi and tube feeding coverage   Best practice:  Diet: tube feeding Pain/Anxiety/Delirium protocol (if indicated): yes, precedex, RASS goal 0 to -1 VAP protocol (if indicated): yes DVT prophylaxis: heparin in fusion GI prophylaxis: Pantoprazole for stress ulcer prophylaxis Glucose control: as above Mobility: bed rest Code Status: full Family Communication: spoke with aunt on 9/27, no answer on 9/28 when I called Disposition: remain in ICU  Labs   CBC: Recent Labs  Lab 07/04/20 0545 07/04/20 1808 07/05/20 0513 07/06/20 0532 07/07/20 0500  WBC 35.6* 37.2* 27.9* 23.0* 20.5*  NEUTROABS  --   --  26.8*  --  19.1*  HGB 12.1 12.1 11.8* 11.6* 11.7*  HCT 39.5 39.5 38.9 39.0 38.5  MCV 106.5* 105.9* 107.2* 108.9* 105.2*  PLT 328 330 307 296 924    Basic Metabolic Panel: Recent Labs  Lab 07/03/20 0401 07/03/20 0401  07/03/20 0909 07/04/20 0545 07/05/20 0513 07/06/20 0532 07/07/20 0500  NA 142   < > 143 146* 146* 149* 151*  K 4.1   < > 4.0 3.8 3.6 3.6 3.8  CL 105  --   --  113* 114* 116* 119*  CO2 24  --   --  23 21* 25 23  GLUCOSE 264*  --   --  228* 228* 85 131*  BUN 81*  --   --  82* 76* 81* 93*  CREATININE 1.44*  --   --  1.44* 1.23* 1.09* 1.13*  CALCIUM 8.9  --   --  8.3* 8.4* 8.5* 8.8*   < > = values in this interval not displayed.   GFR: Estimated Creatinine Clearance: 55.5 mL/min (A) (by C-G formula based on SCr of 1.13 mg/dL (H)). Recent Labs  Lab 07/03/20 0908 07/04/20 0545 07/04/20 0927 07/04/20 1808 07/05/20 0513 07/06/20 0532 07/07/20 0500  WBC  --    < >  --  37.2* 27.9* 23.0* 20.5*  LATICACIDVEN 2.8*  --  2.1*  --   --   --   --    < > = values in this interval not displayed.    Liver Function Tests: Recent Labs  Lab 07/03/20 0401 07/04/20 0545 07/05/20 0513 07/06/20 0532 07/07/20 0500  AST 24 29 23 26 31   ALT 33 44 35 33 43  ALKPHOS 110 87 95 89 126  BILITOT 1.9* 1.9* 1.3* 1.0 1.0  PROT 6.3* 5.9* 5.8* 5.3* 5.7*  ALBUMIN 2.0* 1.8* 1.6* 1.5* 1.5*   No results for input(s): LIPASE, AMYLASE in the last 168 hours. Recent Labs  Lab 06/30/20 0957 07/03/20 0401  AMMONIA 41* 43*    ABG    Component Value Date/Time   PHART 7.485 (H) 07/03/2020 0909   PCO2ART 33.2 07/03/2020 0909   PO2ART 116 (H) 07/03/2020 0909   HCO3 25.1 07/03/2020 0909   TCO2 26 07/03/2020 0909   ACIDBASEDEF 11.0 (H) 06/23/2020 1316   O2SAT 64.5 07/07/2020 0500     Coagulation Profile: No results for input(s): INR, PROTIME in the last 168 hours.  Cardiac Enzymes: Recent Labs  Lab 07/03/20 0908  CKTOTAL 22*    HbA1C: Hgb A1c MFr Bld  Date/Time Value Ref Range Status  06/23/2020 05:34 AM 5.5 4.8 - 5.6 % Final    Comment:    (NOTE) Pre diabetes:          5.7%-6.4%  Diabetes:              >6.4%  Glycemic control for   <7.0% adults with diabetes   07/17/2007 02:46 PM    Final   5.3 (NOTE)   The ADA recommends the following therapeutic goals for glycemic   control related to Hgb A1C measurement:   Goal of Therapy:   < 7.0% Hgb A1C   Action Suggested:  > 8.0% Hgb A1C   Ref:  Diabetes Care, 22, Suppl. 1, 1999    CBG: Recent Labs  Lab 07/06/20 1239 07/06/20 1605 07/06/20 2002 07/06/20 2322 07/07/20 0416  GLUCAP 153* 100* 169* 145* 121*     Critical care time: n/a minutes    Roselie Awkward, MD [facility] Pager: [phone] Cell: [phone] If no response, call [phone]

## 2020-07-07 NOTE — Progress Notes (Addendum)
Advanced Heart Failure Rounding Note   Subjective:    Remains intubated. Trying to wean off vent. She is following commands.   Co-ox stable off milrinone at 65% today. -2.5L in UOP yesterday w/ IV Lasix. CVP 2-3 on my read. SCr stable but BUN trending up 76>>81>>93. Na 151  Continues on abx. mTemp 100.4 WBC trending down.    Objective:   Weight Range:  Vital Signs:   Temp:  [98.8 F (37.1 C)-100.4 F (38 C)] 98.8 F (37.1 C) (09/30 0700) Pulse Rate:  [62-147] 72 (09/30 0700) Resp:  [20-34] 21 (09/30 0700) BP: (98-134)/(59-82) 107/65 (09/30 0700) SpO2:  [83 %-100 %] 83 % (09/30 0700) Arterial Line BP: (126-171)/(56-80) 134/60 (09/30 0700) FiO2 (%):  [40 %] 40 % (09/30 0028) Last BM Date: 07/06/20 (flexiseal with current output)  Weight change: Filed Weights   07/03/20 0422 07/04/20 0500 07/05/20 0500  Weight: 65.7 kg 63.2 kg 64 kg    Intake/Output:   Intake/Output Summary (Last 24 hours) at 07/07/2020 0732 Last data filed at 07/07/2020 0700 Gross per 24 hour  Intake 1919.51 ml  Output 2425 ml  Net -505.49 ml     Physical Exam: CVP 2-3  General:  Chronically ill/ cachetic appearing female, awake on vent  HEENT: normal + ETT Neck: supple. JVD assessment difficult (neck bandaged). Carotids 2+ bilat; no bruits. No lymphadenopathy or thryomegaly appreciated. Cor: PMI nondisplaced. RRR. 2/6 TR Lungs: intubated. Clear. No wheezing  Abdomen: soft, nontender, nondistended. No hepatosplenomegaly. No bruits or masses. Good bowel sounds. Extremities: no cyanosis, clubbing, rash, trace bilateral LE ankle edema + TED Neuro: intubated but awake on vent, alert, cranial nerves grossly intact. moves all 4 extremities w/o difficulty. Affect pleasant   Telemetry: NSR 70s Personally reviewed   Labs: Basic Metabolic Panel: Recent Labs  Lab 07/03/20 0401 07/03/20 0401 07/03/20 0909 07/04/20 0545 07/04/20 0545 07/05/20 0513 07/06/20 0532 07/07/20 0500  NA 142   < >  143 146*  --  146* 149* 151*  K 4.1   < > 4.0 3.8  --  3.6 3.6 3.8  CL 105  --   --  113*  --  114* 116* 119*  CO2 24  --   --  23  --  21* 25 23  GLUCOSE 264*  --   --  228*  --  228* 85 131*  BUN 81*  --   --  82*  --  76* 81* 93*  CREATININE 1.44*  --   --  1.44*  --  1.23* 1.09* 1.13*  CALCIUM 8.9   < >  --  8.3*   < > 8.4* 8.5* 8.8*   < > = values in this interval not displayed.    Liver Function Tests: Recent Labs  Lab 07/03/20 0401 07/04/20 0545 07/05/20 0513 07/06/20 0532 07/07/20 0500  AST '24 29 23 26 31  ' ALT 33 44 35 33 43  ALKPHOS 110 87 95 89 126  BILITOT 1.9* 1.9* 1.3* 1.0 1.0  PROT 6.3* 5.9* 5.8* 5.3* 5.7*  ALBUMIN 2.0* 1.8* 1.6* 1.5* 1.5*   No results for input(s): LIPASE, AMYLASE in the last 168 hours. Recent Labs  Lab 06/30/20 0957 07/03/20 0401  AMMONIA 41* 43*    CBC: Recent Labs  Lab 07/04/20 0545 07/04/20 1808 07/05/20 0513 07/06/20 0532 07/07/20 0500  WBC 35.6* 37.2* 27.9* 23.0* 20.5*  NEUTROABS  --   --  26.8*  --  19.1*  HGB 12.1 12.1 11.8* 11.6*  11.7*  HCT 39.5 39.5 38.9 39.0 38.5  MCV 106.5* 105.9* 107.2* 108.9* 105.2*  PLT 328 330 307 296 303    Cardiac Enzymes: Recent Labs  Lab 07/03/20 0908  CKTOTAL 22*    BNP: BNP (last 3 results) No results for input(s): BNP in the last 8760 hours.  ProBNP (last 3 results) No results for input(s): PROBNP in the last 8760 hours.    Other results:  Imaging: MR BRAIN W WO CONTRAST  Addendum Date: 07/05/2020   ADDENDUM REPORT: 07/05/2020 17:47 ADDENDUM: These results were called by telephone at the time of interpretation on 07/05/2020 at 5:47 pm to provider Dr. Wilnette Kales, who verbally acknowledged these results. Electronically Signed   By: Kellie Simmering DO   On: 07/05/2020 17:47   Result Date: 07/05/2020 CLINICAL DATA:  Provided history: Mental status change, unknown cause. EXAM: MRI HEAD WITHOUT AND WITH CONTRAST TECHNIQUE: Multiplanar, multiecho pulse sequences of the brain and  surrounding structures were obtained without and with intravenous contrast. CONTRAST:  6.71m GADAVIST GADOBUTROL 1 MMOL/ML IV SOLN COMPARISON:  Head CT 07/01/2020, brain MRI 06/25/2020. FINDINGS: Brain: Stable, mild generalized cerebral atrophy. New from the prior brain MRI of 06/25/2020, there is thin T2/FLAIR hyperintensity overlying the bilateral cerebral convexities measuring up to 3 mm in greatest thickness. On the postcontrast imaging, this appears to predominantly corresponding diffuse pachymeningeal dural thickening and enhancement. However, small bilateral subdural effusions are difficult to exclude. Also new from the prior MRI, there is a 9 mm focus of T1 hyperintensity along the right temporal occipital lobes, which may reflect a small focus of subacute extra-axial blood products (series 16, image 15). Stable mild multifocal T2/FLAIR hyperintensity within the cerebral white matter which is nonspecific. There is no acute infarct. No evidence of intracranial mass. No midline shift. Vascular: Expected proximal arterial flow voids. Skull and upper cervical spine: No focal marrow lesion. Sinuses/Orbits: Visualized orbits show no acute finding. No significant paranasal sinus disease at the imaged levels. Bilateral mastoid effusions. IMPRESSION: New from the brain MRI of 06/25/2020, there is T2/FLAIR hyperintense signal abnormality overlying the bilateral cerebral convexities measuring up to 3 mm in greatest thickness. On the postcontrast imaging, this appears to predominantly correspond with diffuse pachymeningeal dural thickening and enhancement. However, small bilateral subdural effusions are difficult to exclude. Diffuse pachymeningeal dural thickening and enhancement is a nonspecific finding with broad differential considerations, but is most commonly related to recent lumbar puncture or craniospinal hypotension. Clinical correlation is recommended. Subcentimeter extra-axial focus of T1 hyperintensity along  the right temporal occipital lobe, new as compared to the prior MRI. This may reflect a small focus of subacute blood products. Unchanged mild generalized cerebral atrophy and mild nonspecific cerebral white matter disease. Electronically Signed: By: KKellie SimmeringDO On: 07/05/2020 17:25   DG Chest Port 1 View  Result Date: 07/07/2020 CLINICAL DATA:  Acute respiratory failure.  Hypoxia. EXAM: PORTABLE CHEST 1 VIEW COMPARISON:  07/04/2020.  CT 06/22/2020. FINDINGS: Endotracheal tube, NG tube, right PICC line in stable position. Stable cardiomegaly. No pulmonary venous congestion. Unchanged bilateral interstitial infiltrates/edema no pleural effusion or pneumothorax. IMPRESSION: 1. Lines and tubes in stable position. 2. Stable cardiomegaly. 3. Unchanged bilateral interstitial infiltrates/edema. Electronically Signed   By: TMarcello Moores Register   On: 07/07/2020 05:43     Medications:     Scheduled Medications: . amiodarone  200 mg Per Tube Daily  . apixaban  5 mg Per Tube BID  . chlorhexidine gluconate (MEDLINE KIT)  15 mL Mouth Rinse  BID  . Chlorhexidine Gluconate Cloth  6 each Topical Q0600  . feeding supplement (PROSource TF)  45 mL Per Tube TID  . influenza vac split quadrivalent PF  0.5 mL Intramuscular Tomorrow-1000  . insulin aspart  0-15 Units Subcutaneous Q4H  . insulin aspart  5 Units Subcutaneous Q4H  . lactulose  30 g Per Tube BID  . levETIRAcetam  500 mg Per Tube BID  . mouth rinse  15 mL Mouth Rinse 10 times per day  . pantoprazole sodium  40 mg Per Tube Daily  . predniSONE  40 mg Per Tube Q breakfast  . sodium chloride flush  10-40 mL Intracatheter Q12H    Infusions: . sodium chloride    . sodium chloride    . dexmedetomidine (PRECEDEX) IV infusion 0.8 mcg/kg/hr (07/07/20 0700)  . feeding supplement (VITAL 1.5 CAL) 50 mL/hr at 07/07/20 0400  . meropenem (MERREM) IV Stopped (07/07/20 0542)    PRN Medications: Place/Maintain arterial line **AND** sodium chloride,  Place/Maintain arterial line **AND** sodium chloride, acetaminophen (TYLENOL) oral liquid 160 mg/5 mL, docusate, fentaNYL, midazolam, polyethylene glycol, sodium chloride flush    Plan/Discussion:    1. Shock  - initially cardiogenic shock due to severe RV failure/cor pulmonale  - Echo 06/22/20 Echo LV small compressed from RV EF 45-50% RV massively dilated and severely hypokinetic. Severe TR RVSP 76 Severe D-sign. Moderate pericardial effusion  - improved with milrinone and diuresis - On 9/27 developed component of septic shock. BCx NGTD. E coli (ESBL) in sputum - WBC improving on cefepime/vanc. ESBL resistant to cefepime =>Switched to mero - suspect fevers may also be related to vasculitic flare with peripheral vasoconstriction   2. Parker RV/failure - RH cath in 12/20 with mild PAH with normal RV function. - PFTs much better than I expected based on CT chest. DLCO markedly reduced - Echo 06/22/20 Echo LV small compressed from RV EF 45-50% RV massively dilated and severely hypokinetic. Severe TR RVSP 76 Severe D-sign. Moderate pericardial effusion  - Milrinone stopped 9/26. Co-ox remains ok at 65% - CVP ~3 on my read. BUN trending up, 93 today. Hold diuretics for now - RHC after extubation, hopefully tomorrow or Monday   3. Severe encephalopathy - etiology unclear - repeat head CT unrevealing.  - brain MRI on 9/18 with no evidence vasculitis (has received steroids). Repeat brain MRI with findings as above. (unclear significance)  - improved with increasing steroids   4. Acute hypoxic respiratory failure - multifactorial including PAH, ILD, large R effusion, pulmonary edema and possible aspiration  - Tracheal aspirate now with ESBL E.coli. Continue abx therapy w/ meropenum - improving. Hopefully can be extubated today or tomorrow. Vent management per PCCM   5. 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 Down  to 5.  - Remains on lactulose   6. CTD with h/o RA and SLE - Case d/w with Dr. [place] Bells. She has had rapid progression of RV failure along with mental status changes and lung infiltrates in setting of known CTD. Several markers elevated- - Agree that this is mostly likely CTD flare.  - Steroids have been increased. Would continue for extended course - Dr. Sherrin Daisy has discussed w/ her outpatient Rheumatologist. He feels that she has a vasculitis picture. - Given her underlying infections (currently ESBL) we can't use more aggressive immunosuppression other than prednisone at this time.  7. DVT/PE - diagnosed 6/[facility] -  CT this admit. Negative for acute PE - back on Eliquis  8. AFL/atrial tach with runs NSVT  - in NSR - Keep amio at 200 daily  9. Hypernatremia - sodium 146 -> 149->151 - hold diuretics today - consider free water boluses    Length of Stay: [address], PA-C  07/07/2020, 7:32 AM  Advanced Heart Failure Team Pager [phone] (M-F; 7a - 4p)  Please contact [facility] for night-coverage after hours (4p -7a ) and weekends on [email or web address]  Agree with above.   Awake on vent. Following commands. Weaning. Got IV lasix yesterday with good output. CVP low. Serum sodium climbing. Rhythm stable,   General:  Awake on vent  HEENT: normal Neck: supple. no JVD. Carotids 2+ bilat; no bruits. No lymphadenopathy or thryomegaly appreciated. Cor: PMI nondisplaced. Regular rate & rhythm. 2/6 TR Lungs: clear Abdomen: soft, nontender, nondistended. No hepatosplenomegaly. No bruits or masses. Good bowel sounds. Extremities: no cyanosis, clubbing, rash, edema + wounds Neuro: alert & orientedx3, cranial nerves grossly intact. moves all 4 extremities w/o difficulty. Affect pleasant  Remains intubated. Co-ox ok at 65%. Volume status low. Rhythm stable.   Hold diuretics. Plan RHC early next week once extubated. Continue steroids for CTD flare.   CRITICAL  CARE Performed by: Glori Bickers  Total critical care time: 35 minutes  Critical care time was exclusive of separately billable procedures and treating other patients.  Critical care was necessary to treat or prevent imminent or life-threatening deterioration.  Critical care was time spent personally by me (independent of midlevel providers or residents) on the following activities: development of treatment plan with patient and/or surrogate as well as nursing, discussions with consultants, evaluation of patient's response to treatment, examination of patient, obtaining history from patient or surrogate, ordering and performing treatments and interventions, ordering and review of laboratory studies, ordering and review of radiographic studies, pulse oximetry and re-evaluation of patient's condition.  Glori Bickers, MD  4:13 PM

## 2020-07-08 ENCOUNTER — Inpatient Hospital Stay (HOSPITAL_COMMUNITY): Payer: BC Managed Care – PPO

## 2020-07-08 DIAGNOSIS — J189 Pneumonia, unspecified organism: Secondary | ICD-10-CM

## 2020-07-08 DIAGNOSIS — J155 Pneumonia due to Escherichia coli: Secondary | ICD-10-CM

## 2020-07-08 LAB — COMPREHENSIVE METABOLIC PANEL
ALT: 53 U/L — ABNORMAL HIGH (ref 0–44)
AST: 43 U/L — ABNORMAL HIGH (ref 15–41)
Albumin: 1.4 g/dL — ABNORMAL LOW (ref 3.5–5.0)
Alkaline Phosphatase: 128 U/L — ABNORMAL HIGH (ref 38–126)
Anion gap: 8 (ref 5–15)
BUN: 88 mg/dL — ABNORMAL HIGH (ref 6–20)
CO2: 24 mmol/L (ref 22–32)
Calcium: 8.7 mg/dL — ABNORMAL LOW (ref 8.9–10.3)
Chloride: 118 mmol/L — ABNORMAL HIGH (ref 98–111)
Creatinine, Ser: 1.07 mg/dL — ABNORMAL HIGH (ref 0.44–1.00)
GFR calc Af Amer: >60 mL/min (ref >60)
GFR calc non Af Amer: 58 mL/min — ABNORMAL LOW (ref >60)
Glucose, Bld: 149 mg/dL — ABNORMAL HIGH (ref 70–99)
Potassium: 4.3 mmol/L (ref 3.5–5.1)
Sodium: 150 mmol/L — ABNORMAL HIGH (ref 135–145)
Total Bilirubin: 0.9 mg/dL (ref 0.3–1.2)
Total Protein: 5.2 g/dL — ABNORMAL LOW (ref 6.5–8.1)

## 2020-07-08 LAB — CBC WITH DIFFERENTIAL/PLATELET
Abs Immature Granulocytes: 0.44 10*3/uL — ABNORMAL HIGH (ref 0.00–0.07)
Basophils Absolute: 0.1 10*3/uL (ref 0.0–0.1)
Basophils Relative: 0 %
Eosinophils Absolute: 0 10*3/uL (ref 0.0–0.5)
Eosinophils Relative: 0 %
HCT: 38.2 % (ref 36.0–46.0)
Hemoglobin: 11.6 g/dL — ABNORMAL LOW (ref 12.0–15.0)
Immature Granulocytes: 2 %
Lymphocytes Relative: 1 %
Lymphs Abs: 0.3 10*3/uL — ABNORMAL LOW (ref 0.7–4.0)
MCH: 32.6 pg (ref 26.0–34.0)
MCHC: 30.4 g/dL (ref 30.0–36.0)
MCV: 107.3 fL — ABNORMAL HIGH (ref 80.0–100.0)
Monocytes Absolute: 0.7 10*3/uL (ref 0.1–1.0)
Monocytes Relative: 3 %
Neutro Abs: 23.5 10*3/uL — ABNORMAL HIGH (ref 1.7–7.7)
Neutrophils Relative %: 94 %
Platelets: 294 10*3/uL (ref 150–400)
RBC: 3.56 MIL/uL — ABNORMAL LOW (ref 3.87–5.11)
RDW: 19.5 % — ABNORMAL HIGH (ref 11.5–15.5)
WBC: 25.2 10*3/uL — ABNORMAL HIGH (ref 4.0–10.5)
nRBC: 0 % (ref 0.0–0.2)

## 2020-07-08 LAB — COOXEMETRY PANEL
Carboxyhemoglobin: 1.5 % (ref 0.5–1.5)
Methemoglobin: 0.8 % (ref 0.0–1.5)
O2 Saturation: 99.4 %
Total hemoglobin: 12.2 g/dL (ref 12.0–16.0)

## 2020-07-08 LAB — GLUCOSE, CAPILLARY
Glucose-Capillary: 117 mg/dL — ABNORMAL HIGH (ref 70–99)
Glucose-Capillary: 127 mg/dL — ABNORMAL HIGH (ref 70–99)
Glucose-Capillary: 85 mg/dL (ref 70–99)
Glucose-Capillary: 155 mg/dL — ABNORMAL HIGH (ref 70–99)
Glucose-Capillary: 149 mg/dL — ABNORMAL HIGH (ref 70–99)
Glucose-Capillary: 94 mg/dL (ref 70–99)

## 2020-07-08 MED ORDER — LACTULOSE 10 GM/15ML PO SOLN
30.0000 g | Freq: Every day | ORAL | Status: DC
  Administered 2020-07-08 – 2020-07-22 (×14): 30 g
  Filled 2020-07-08 (×3): qty 60
  Filled 2020-07-08: qty 45
  Filled 2020-07-08: qty 60
  Filled 2020-07-08: qty 45
  Filled 2020-07-08: qty 60
  Filled 2020-07-08: qty 45
  Filled 2020-07-08 (×2): qty 60
  Filled 2020-07-08: qty 45
  Filled 2020-07-08: qty 60
  Filled 2020-07-08: qty 45
  Filled 2020-07-08: qty 60

## 2020-07-08 NOTE — Procedures (Signed)
Cortrak  Person Inserting Tube:  Chelesea Weiand E, RD Tube Type:  Cortrak - 43 inches Tube Location:  Left nare Initial Placement:  Stomach Secured by: Bridle Technique Used to Measure Tube Placement:  Documented cm marking at nare/ corner of mouth Cortrak Secured At:  70 cm   Cortrak Tube Team Note:  Consult received to place a Cortrak feeding tube.   No x-ray is required. RN may begin using tube.   If the tube becomes dislodged please keep the tube and contact the Cortrak team at [email or web address] ([email or web address]) for replacement.  If after hours and replacement cannot be delayed, place a NG tube and confirm placement with an abdominal x-ray.    Alexandr Yaworski, MS, RD, LDN RD pager number and weekend/on-call pager number located in Amion.    

## 2020-07-08 NOTE — Plan of Care (Signed)
  Problem: Pain Managment: Goal: General experience of comfort will improve Outcome: Progressing   

## 2020-07-08 NOTE — Progress Notes (Signed)
  Stable from cardiac perspective.   Plans for trach on Monday noted.   We will see again on Monday unless called over the weekend.   Plan RHC prior to d/c.  Glori Bickers, MD  7:31 PM

## 2020-07-08 NOTE — Progress Notes (Signed)
NAME:  Carolyn Proctor, MRN:  [id number], DOB:  02-16-1961, LOS: 81 ADMISSION DATE:  06/22/2020, CONSULTATION DATE:  9/15 REFERRING MD:  Tyrone Nine, CHIEF COMPLAINT:  Confusion, refractory hypoglycemia   Brief History   33 yoF at home who complained to family she felt like she was having a stroke, then developed left facial droop and went unresponsive.  Required BVM by EMS and noted to be hypoglycemic and hypotensive.  Intubated in ER for airway protection.  CTH negative.  Refractory hypoglycemia despite multiple D50 amps and required a drip.  PCCM called to admit.   Past Medical History  PAF, DVT, PE on Eliquis HFpEF CKD stage III RA Hypercoagulable state (increased protein S antigen, decreased protein C activity, and decreased Antithrombin III) Centrilobular emphysema and pulmonary fibrosis- non smoker Chronic venous insufficiency w/ chronic foot ulcers  Diverticulitis   Significant Hospital Events   9/15 admitted/ intubated >> pulse dose steroids, diuresis, cardiogenic shock support 9/25 on full vent support, encephalopathy precluding extubation 9/26 not weaning, encephalopathic, dysautonomia, off milrinonoe  Consults:  Neurology Advanced heart failure  Procedures:  9/15 ETT >> 9/15 R IJ TL CVC >>9/27 9/27 PICC >   Significant Diagnostic Tests:   9/15 CTH >> negative  9/15 CTA chest/ abd/ pelvis >> significant dilatation of RA and RV consistent with R heart failure; moderate R and small L pleural effusions with associated atelectasis of both lower lobes; underlying chronic cystic and fibrotic lung disease; severe anasarca; nodular contour of liver suggestive of possible cirrhosis  9/15 Echo> LVEF 35-40%, mod decreased LV function, RVSP 75, severely reduced RV systolic function; R to left shunt noted, suggestive of PFO, RA severely dilated, mod pericardial effusion  9/15 CTA head and neck >> no significant carotid or vertebral artery stenosis in neck, no LVO, right jugular  central venous catheter with probable surrounding hematoma  9/18 MR BRAIN>> no acute intracranial abnormality  9/28 MRI brain > diffuse pachymeningeal enhancement, bilateral cerebral convexities, sub centimeter extra axial focus of T1 hyperintensity along R temporal occiptal lobe, new  Micro Data:  9/15 BCx2 central line >> NG5D 9/15 BCx2 PIV >> Strept mitis 9/15 SARS2 >> neg 9/15 UC >> ESBL E coli 9/16 Trach asp cult >> Normal flora 9/27 blood >  9/27 resp > e coli ESBL  Antimicrobials:  9/15 vanc >> 9/17 9/15 zosyn >> 9/20  9/27 vanc > 9/28 9/27 cefepime > 9/29 9/29 meropenem >   Interim history/subjective:  Low Vt with SBT.  Objective   Blood pressure (!) 98/56, pulse 78, temperature (!) 96.6 F (35.9 C), resp. rate 20, height 5\' 11"  (1.803 m), weight 64 kg, last menstrual period 02/19/2013, SpO2 97 %. CVP:  [3 mmHg-12 mmHg] 3 mmHg  Vent Mode: PRVC FiO2 (%):  [40 %] 40 % Set Rate:  [20 bmp] 20 bmp Vt Set:  [500 mL] 500 mL PEEP:  [5 cmH20] 5 cmH20 Pressure Support:  [10 cmH20] 10 cmH20 Plateau Pressure:  [19 cmH20-22 cmH20] 19 cmH20   Intake/Output Summary (Last 24 hours) at 07/08/2020 0738 Last data filed at 07/08/2020 0600 Gross per 24 hour  Intake 1917.21 ml  Output 2275 ml  Net -357.79 ml   Filed Weights   07/03/20 0422 07/04/20 0500 07/05/20 0500  Weight: 65.7 kg 63.2 kg 64 kg    Examination:  General - thin Eyes - pupils reactive ENT - ETT in place Cardiac - regular rate/rhythm, no murmur Chest - scattered rhonchi Abdomen - soft, non tender, + bowel  sounds Extremities - no cyanosis, clubbing, or edema Skin - no rashes Neuro - RASS 0       Resolved Hospital Problem list   ESBL E. 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 - followed by Dr. Ephriam Jenkins with [facility] - previously treated with rituximab >> last in November 2020 - defer further immunosuppressive therapy while she is being treated for infection  Pulmonary artery hypertension. Acute systolic CHF. Atrial flutter/atrial tachycardia with runs of NSVT - new this admission. - WHO group 1, 2, 3 - advanced heart failure team following - continue amiodarone  Acute metabolic encephalopathy from hypoglycemia, hypotension, hypoxia. Seizure in setting of hypoxia with respiratory failure. - RASS goal 0 to -1 - continue keppra for now  Hypernatremia. - free water - f/u BMET  Pulmonary embolism in June 2021. - continue eliquis  Liver cirrhosis. - f/u LFTs intermittently - change chronulac to daily  Steroid induced hyperglycemia. - SSI  Severe protein calorie malnutrition. - tube feeds  Best practice:  Diet: tube feeding DVT prophylaxis: eliquis GI prophylaxis: protonix Mobility: bed rest Code Status: full Disposition: ICU  Labs    CMP Latest Ref Rng & Units 07/08/2020 07/07/2020 07/06/2020  Glucose 70 - 99 mg/dL 149(H) 131(H) 85  BUN 6 - 20 mg/dL 88(H) 93(H) 81(H)  Creatinine 0.44 - 1.00 mg/dL 1.07(H) 1.13(H) 1.09(H)  Sodium 135 - 145 mmol/L 150(H) 151(H) 149(H)  Potassium 3.5 - 5.1 mmol/L 4.3 3.8 3.6  Chloride 98 - 111 mmol/L 118(H) 119(H) 116(H)  CO2 22 - 32 mmol/L 24 23 25   Calcium 8.9 - 10.3 mg/dL 8.7(L) 8.8(L) 8.5(L)  Total Protein 6.5 - 8.1 g/dL 5.2(L) 5.7(L) 5.3(L)  Total Bilirubin 0.3 - 1.2 mg/dL 0.9 1.0 1.0  Alkaline Phos 38 - 126 U/L 128(H) 126 89  AST 15 - 41 U/L  43(H) 31 26  ALT 0 - 44 U/L 53(H) 43 33    CBC Latest Ref Rng & Units 07/08/2020 07/07/2020 07/06/2020  WBC 4.0 - 10.5 K/uL 25.2(H) 20.5(H) 23.0(H)  Hemoglobin 12.0 - 15.0 g/dL 11.6(L) 11.7(L) 11.6(L)  Hematocrit 36 - 46 % 38.2 38.5 39.0  Platelets 150 - 400 K/uL 294 303 296    ABG    Component Value Date/Time   PHART 7.485 (H) 07/03/2020 0909   PCO2ART 33.2 07/03/2020 0909   PO2ART 116 (H) 07/03/2020 0909   HCO3 25.1 07/03/2020 0909   TCO2 26 07/03/2020 0909   ACIDBASEDEF 11.0 (H) 06/23/2020 1316   O2SAT 99.4 07/08/2020 0517    CBG (last 3)  Recent Labs    07/07/20 2330 07/08/20 0407 07/08/20 0801  GLUCAP 112* 117* 127*    Critical care time: 39 minutes  Chesley Mires, MD [place] Pager - [phone] 07/08/2020, 8:06 AM

## 2020-07-08 NOTE — Progress Notes (Signed)
Spoke with pt's daughter.  Updated about current treatment plan.  Discussed risks/benefits of tracheostomy and plan for procedure on 07/11/20 with Dr. Tamala Julian.  She is agreeable to have procedure done.  Chesley Mires, MD [place] Pager - [phone] 07/08/2020, 10:51 AM

## 2020-07-09 LAB — BASIC METABOLIC PANEL
Anion gap: 11 (ref 5–15)
BUN: 80 mg/dL — ABNORMAL HIGH (ref 6–20)
CO2: 24 mmol/L (ref 22–32)
Calcium: 8.5 mg/dL — ABNORMAL LOW (ref 8.9–10.3)
Chloride: 114 mmol/L — ABNORMAL HIGH (ref 98–111)
Creatinine, Ser: 1.16 mg/dL — ABNORMAL HIGH (ref 0.44–1.00)
GFR calc Af Amer: >60 mL/min (ref >60)
GFR calc non Af Amer: 52 mL/min — ABNORMAL LOW (ref >60)
Glucose, Bld: 192 mg/dL — ABNORMAL HIGH (ref 70–99)
Potassium: 4.4 mmol/L (ref 3.5–5.1)
Sodium: 149 mmol/L — ABNORMAL HIGH (ref 135–145)

## 2020-07-09 LAB — CULTURE, BLOOD (ROUTINE X 2)
Culture: NO GROWTH
Culture: NO GROWTH
Special Requests: ADEQUATE

## 2020-07-09 LAB — GLUCOSE, CAPILLARY
Glucose-Capillary: 182 mg/dL — ABNORMAL HIGH (ref 70–99)
Glucose-Capillary: 164 mg/dL — ABNORMAL HIGH (ref 70–99)
Glucose-Capillary: 166 mg/dL — ABNORMAL HIGH (ref 70–99)
Glucose-Capillary: 163 mg/dL — ABNORMAL HIGH (ref 70–99)
Glucose-Capillary: 129 mg/dL — ABNORMAL HIGH (ref 70–99)
Glucose-Capillary: 103 mg/dL — ABNORMAL HIGH (ref 70–99)
Glucose-Capillary: 162 mg/dL — ABNORMAL HIGH (ref 70–99)

## 2020-07-09 LAB — COOXEMETRY PANEL
Carboxyhemoglobin: 1.5 % (ref 0.5–1.5)
Methemoglobin: 0.8 % (ref 0.0–1.5)
O2 Saturation: 74.2 %
Total hemoglobin: 12.4 g/dL (ref 12.0–16.0)

## 2020-07-09 MED ORDER — WHITE PETROLATUM EX OINT
TOPICAL_OINTMENT | CUTANEOUS | Status: DC | PRN
  Administered 2020-07-18: 0.2 via TOPICAL
  Filled 2020-07-09 (×2): qty 28.35

## 2020-07-09 NOTE — Progress Notes (Signed)
NAME:  Carolyn Proctor, MRN:  [id number], DOB:  02/24/1961, LOS: 74 ADMISSION DATE:  06/22/2020, CONSULTATION DATE:  9/15 REFERRING MD:  Tyrone Nine, CHIEF COMPLAINT:  Confusion, refractory hypoglycemia   Brief History   60 yoF at home who complained to family she felt like she was having a stroke, then developed left facial droop and went unresponsive.  Required BVM by EMS and noted to be hypoglycemic and hypotensive.  Intubated in ER for airway protection.  CTH negative.  Refractory hypoglycemia despite multiple D50 amps and required a drip.  PCCM called to admit.   Past Medical History  PAF, DVT, PE on Eliquis HFpEF CKD stage III RA Hypercoagulable state (increased protein S antigen, decreased protein C activity, and decreased Antithrombin III) Centrilobular emphysema and pulmonary fibrosis- non smoker Chronic venous insufficiency w/ chronic foot ulcers  Diverticulitis   Significant Hospital Events   9/15 admitted/ intubated >> pulse dose steroids, diuresis, cardiogenic shock support 9/25 on full vent support, encephalopathy precluding extubation 9/26 not weaning, encephalopathic, dysautonomia, off milrinonoe  Consults:  Neurology Advanced heart failure  Procedures:  9/15 ETT >> 9/15 R IJ TL CVC >>9/27 9/27 PICC >   Significant Diagnostic Tests:   9/15 CTH >> negative  9/15 CTA chest/ abd/ pelvis >> significant dilatation of RA and RV consistent with R heart failure; moderate R and small L pleural effusions with associated atelectasis of both lower lobes; underlying chronic cystic and fibrotic lung disease; severe anasarca; nodular contour of liver suggestive of possible cirrhosis  9/15 Echo> LVEF 35-40%, mod decreased LV function, RVSP 75, severely reduced RV systolic function; R to left shunt noted, suggestive of PFO, RA severely dilated, mod pericardial effusion  9/15 CTA head and neck >> no significant carotid or vertebral artery stenosis in neck, no LVO, right jugular  central venous catheter with probable surrounding hematoma  9/18 MR BRAIN>> no acute intracranial abnormality  9/28 MRI brain > diffuse pachymeningeal enhancement, bilateral cerebral convexities, sub centimeter extra axial focus of T1 hyperintensity along R temporal occiptal lobe, new  Micro Data:  9/15 BCx2 central line >> NG5D 9/15 BCx2 PIV >> Strept mitis 9/15 SARS2 >> negative 9/15 UC >> ESBL E coli 9/16 Trach asp cult >> Normal flora 9/27 blood > negative 9/27 resp > e coli ESBL  Antimicrobials:  9/15 vanc >> 9/17 9/15 zosyn >> 9/20  9/27 vanc > 9/28 9/27 cefepime > 9/29 9/29 meropenem >   Interim history/subjective:  Increased RR with SBT.  Objective   Blood pressure 110/71, pulse 89, temperature 98.6 F (37 C), resp. rate (!) 25, height 5\' 11"  (1.803 m), weight 63.7 kg, last menstrual period 02/19/2013, SpO2 100 %. CVP:  [7 mmHg-9 mmHg] 7 mmHg  Vent Mode: PSV;CPAP FiO2 (%):  [40 %] 40 % Set Rate:  [20 bmp] 20 bmp Vt Set:  [500 mL] 500 mL PEEP:  [5 cmH20] 5 cmH20 Pressure Support:  [5 cmH20] 5 cmH20 Plateau Pressure:  [17 cmH20-22 cmH20] 17 cmH20   Intake/Output Summary (Last 24 hours) at 07/09/2020 0940 Last data filed at 07/09/2020 0910 Gross per 24 hour  Intake 1804.26 ml  Output 2555 ml  Net -750.74 ml   Filed Weights   07/04/20 0500 07/05/20 0500 07/09/20 0500  Weight: 63.2 kg 64 kg 63.7 kg    Examination:  General - cachectic Eyes - pupils reactive ENT - ETT in place Cardiac - regular rate/rhythm, no murmur Chest - b/l rhonchi Abdomen - soft, non tender, + bowel sounds  Extremities - 1+ edema Neuro - RASS 0   Resolved Hospital Problem list   ESBL E. 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 - followed by Dr. Ephriam Jenkins with [facility] - previously treated with rituximab >> last in November 2020 - defer further immunosuppressive therapy while she is being treated for infection  Pulmonary artery hypertension. Acute systolic CHF. Atrial flutter/atrial tachycardia with runs of NSVT - new this admission. - WHO group 1, 2, 3 - advanced heart failure team following - continue amiodarone  Acute metabolic encephalopathy from hypoglycemia, hypotension, hypoxia. Seizure in setting of hypoxia with respiratory failure. - RASS goal 0 to -1 - continue keppra for now  Hypernatremia. - free water - f/u BMET  Pulmonary embolism in June 2021. - holding eliquis in anticipation of tracheostomy on 10/04  Liver cirrhosis. - f/u LFTs - continue chronulac  Steroid induced hyperglycemia. - SSI  Severe protein calorie malnutrition. - tube feeds  Best practice:  Diet: tube feeding DVT prophylaxis: eliquis GI prophylaxis: protonix Mobility: bed rest Code Status: full Disposition: ICU  Labs    CMP Latest Ref Rng & Units 07/09/2020 07/08/2020 07/07/2020  Glucose 70 - 99 mg/dL 192(H) 149(H) 131(H)  BUN 6 - 20 mg/dL 80(H) 88(H) 93(H)  Creatinine 0.44 - 1.00 mg/dL 1.16(H) 1.07(H) 1.13(H)  Sodium 135 - 145 mmol/L 149(H) 150(H) 151(H)  Potassium 3.5 - 5.1 mmol/L 4.4 4.3 3.8  Chloride 98 - 111 mmol/L 114(H) 118(H) 119(H)  CO2 22 - 32 mmol/L 24 24 23   Calcium 8.9 - 10.3 mg/dL 8.5(L) 8.7(L) 8.8(L)  Total Protein 6.5 - 8.1 g/dL - 5.2(L) 5.7(L)  Total Bilirubin 0.3 - 1.2 mg/dL -  0.9 1.0  Alkaline Phos 38 - 126 U/L - 128(H) 126  AST 15 - 41 U/L - 43(H) 31  ALT 0 - 44 U/L - 53(H) 43    CBC Latest Ref Rng & Units 07/08/2020 07/07/2020 07/06/2020  WBC 4.0 - 10.5 K/uL 25.2(H) 20.5(H) 23.0(H)  Hemoglobin 12.0 - 15.0 g/dL 11.6(L) 11.7(L) 11.6(L)  Hematocrit 36 - 46 % 38.2 38.5 39.0  Platelets 150 - 400 K/uL 294 303 296    ABG    Component Value Date/Time   PHART 7.485 (H) 07/03/2020 0909   PCO2ART 33.2 07/03/2020 0909   PO2ART 116 (H) 07/03/2020 0909   HCO3 25.1 07/03/2020 0909   TCO2 26 07/03/2020 0909   ACIDBASEDEF 11.0 (H) 06/23/2020 1316   O2SAT 74.2 07/09/2020 0459    CBG (last 3)  Recent Labs    07/09/20 0336 07/09/20 0746 07/09/20 0749  GLUCAP 182* 166* 164*    Critical care time: 34 minutes  Chesley Mires, MD [place] Pager - [phone] 07/09/2020, 9:40 AM

## 2020-07-09 NOTE — Progress Notes (Signed)
Pharmacy Antibiotic Note  Carolyn Proctor is a 59 y.o. female admitted on 06/22/2020 with pneumonia, sepsis and UTI.  There was concern for development of pneumonia, and patient was started empirically on vancomycin and cefepime. Once TA culture grew GNR, vancomycin was discontinued. Now, pharmacy has been consulted for meropenem dosing. This is day 4 of meropenem. Patient has been previously treated with 6 days of Zosyn for ESBL Ecoli UTI. Pt now has ESBL EColi in tracheal aspirate culture. Pt is afebrile, WBC up 25.2. Planning 7 days per CCM note.  Plan: Meropenem 2g IV q8h  Monitor clinical progress, CBC, micro data, renal function, and fever trends.  Height: 5\' 11"  (180.3 cm) Weight: 63.7 kg (140 lb 6.9 oz) IBW/kg (Calculated) : 70.8  Temp (24hrs), Avg:98.6 F (37 C), Min:97.3 F (36.3 C), Max:99 F (37.2 C)  Recent Labs  Lab 07/03/20 0908 07/04/20 0545 07/04/20 0927 07/04/20 1808 07/05/20 0513 07/06/20 0532 07/07/20 0500 07/08/20 0500 07/09/20 0459  WBC  --    < >  --  37.2* 27.9* 23.0* 20.5* 25.2*  --   CREATININE  --    < >  --   --  1.23* 1.09* 1.13* 1.07* 1.16*  LATICACIDVEN 2.8*  --  2.1*  --   --   --   --   --   --    < > = values in this interval not displayed.    Estimated Creatinine Clearance: 53.8 mL/min (A) (by C-G formula based on SCr of 1.16 mg/dL (H)).    No Known Allergies  Antimicrobials this admission: Vanc 9/15 x 1; restart 9/16 >> 9/17; 9/27>9/28 Zosyn 9/15 x 1; restart 9/16 >> 9/20 Cefepime 9/27>9/29 Meropenem 9/29>  9/15 BCx >> 2/4 Strep mitis/oralis (likely contaminant) 9/15 UCx >> 100k col ESBL Ecoli (s-zosyn) 9/16 RCx (trach aspirate) - negative 9/16 MRSA- negative  9/26: Bcx : ng 2 days 9/26: Rcx: E coli, ESBL   Thank you for involving pharmacy in this patient's care.  Renold Genta, PharmD, BCPS Clinical Pharmacist Clinical phone for 07/09/2020 until 3p is [phone] 07/09/2020 11:54 AM  **Pharmacist phone directory can be found on  [email or web address] listed under [place]**

## 2020-07-09 NOTE — Progress Notes (Signed)
Assisted tele visit to patient with daughter.  Alver Sorrow, RN

## 2020-07-10 ENCOUNTER — Inpatient Hospital Stay (HOSPITAL_COMMUNITY): Payer: BC Managed Care – PPO

## 2020-07-10 LAB — BASIC METABOLIC PANEL
Anion gap: 11 (ref 5–15)
BUN: 64 mg/dL — ABNORMAL HIGH (ref 6–20)
CO2: 25 mmol/L (ref 22–32)
Calcium: 7 mg/dL — ABNORMAL LOW (ref 8.9–10.3)
Chloride: 114 mmol/L — ABNORMAL HIGH (ref 98–111)
Creatinine, Ser: 0.95 mg/dL (ref 0.44–1.00)
GFR calc Af Amer: >60 mL/min (ref >60)
GFR calc non Af Amer: >60 mL/min (ref >60)
Glucose, Bld: 129 mg/dL — ABNORMAL HIGH (ref 70–99)
Potassium: 4 mmol/L (ref 3.5–5.1)
Sodium: 150 mmol/L — ABNORMAL HIGH (ref 135–145)

## 2020-07-10 LAB — GLUCOSE, CAPILLARY
Glucose-Capillary: 151 mg/dL — ABNORMAL HIGH (ref 70–99)
Glucose-Capillary: 113 mg/dL — ABNORMAL HIGH (ref 70–99)
Glucose-Capillary: 96 mg/dL (ref 70–99)
Glucose-Capillary: 192 mg/dL — ABNORMAL HIGH (ref 70–99)
Glucose-Capillary: 121 mg/dL — ABNORMAL HIGH (ref 70–99)

## 2020-07-10 LAB — COOXEMETRY PANEL
Carboxyhemoglobin: 1.5 % (ref 0.5–1.5)
Methemoglobin: 0.8 % (ref 0.0–1.5)
O2 Saturation: 77.6 %
Total hemoglobin: 9.9 g/dL — ABNORMAL LOW (ref 12.0–16.0)

## 2020-07-10 LAB — CBC
HCT: 32.7 % — ABNORMAL LOW (ref 36.0–46.0)
Hemoglobin: 9.5 g/dL — ABNORMAL LOW (ref 12.0–15.0)
MCH: 32.2 pg (ref 26.0–34.0)
MCHC: 29.1 g/dL — ABNORMAL LOW (ref 30.0–36.0)
MCV: 110.8 fL — ABNORMAL HIGH (ref 80.0–100.0)
Platelets: 235 10*3/uL (ref 150–400)
RBC: 2.95 MIL/uL — ABNORMAL LOW (ref 3.87–5.11)
RDW: 19.3 % — ABNORMAL HIGH (ref 11.5–15.5)
WBC: 20.9 10*3/uL — ABNORMAL HIGH (ref 4.0–10.5)
nRBC: 0 % (ref 0.0–0.2)

## 2020-07-10 MED ORDER — DEXTROSE 5 % IV SOLN: INTRAVENOUS | Status: DC

## 2020-07-10 NOTE — Plan of Care (Signed)
  Problem: Clinical Measurements: Goal: Cardiovascular complication will be avoided Outcome: Progressing   

## 2020-07-10 NOTE — Progress Notes (Signed)
NAME:  Carolyn Proctor, MRN:  [id number], DOB:  [birth year], LOS: 51 ADMISSION DATE:  06/22/2020, CONSULTATION DATE:  9/15 REFERRING MD:  Tyrone Nine, CHIEF COMPLAINT:  Confusion, refractory hypoglycemia   Brief History   55 yoF at home who complained to family she felt like she was having a stroke, then developed left facial droop and went unresponsive.  Required BVM by EMS and noted to be hypoglycemic and hypotensive.  Intubated in ER for airway protection.  CTH negative.  Refractory hypoglycemia despite multiple D50 amps and required a drip.  PCCM called to admit.   Past Medical History  PAF, DVT, PE on Eliquis HFpEF CKD stage III RA Hypercoagulable state (increased protein S antigen, decreased protein C activity, and decreased Antithrombin III) Centrilobular emphysema and pulmonary fibrosis- non smoker Chronic venous insufficiency w/ chronic foot ulcers  Diverticulitis   Significant Hospital Events   9/15 admitted/ intubated >> pulse dose steroids, diuresis, cardiogenic shock support 9/25 on full vent support, encephalopathy precluding extubation 9/26 not weaning, encephalopathic, dysautonomia, off milrinonoe  Consults:  Neurology Advanced heart failure  Procedures:  9/15 ETT >> 9/15 R IJ TL CVC >>9/27 9/27 PICC >   Significant Diagnostic Tests:   9/15 CTH >> negative  9/15 CTA chest/ abd/ pelvis >> significant dilatation of RA and RV consistent with R heart failure; moderate R and small L pleural effusions with associated atelectasis of both lower lobes; underlying chronic cystic and fibrotic lung disease; severe anasarca; nodular contour of liver suggestive of possible cirrhosis  9/15 Echo> LVEF 35-40%, mod decreased LV function, RVSP 75, severely reduced RV systolic function; R to left shunt noted, suggestive of PFO, RA severely dilated, mod pericardial effusion  9/15 CTA head and neck >> no significant carotid or vertebral artery stenosis in neck, no LVO, right jugular  central venous catheter with probable surrounding hematoma  9/18 MR BRAIN>> no acute intracranial abnormality  9/28 MRI brain > diffuse pachymeningeal enhancement, bilateral cerebral convexities, sub centimeter extra axial focus of T1 hyperintensity along R temporal occiptal lobe, new  Micro Data:  9/15 BCx2 central line >> NG5D 9/15 BCx2 PIV >> Strept mitis 9/15 SARS2 >> negative 9/15 UC >> ESBL E coli 9/16 Trach asp cult >> Normal flora 9/27 blood > negative 9/27 resp > e coli ESBL  Antimicrobials:  9/15 vanc >> 9/17 9/15 zosyn >> 9/20  9/27 vanc > 9/28 9/27 cefepime > 9/29 9/29 meropenem >   Interim history/subjective:  Can tolerate pressure support for about 1 hour, then needs full support.  Denies chest/abdominal pain.  Objective   Blood pressure (!) 86/54, pulse 73, temperature 98.7 F (37.1 C), temperature source Rectal, resp. rate (!) 21, height 5\' 11"  (1.803 m), weight 64 kg, last menstrual period 02/19/2013, SpO2 98 %. CVP:  [10 mmHg-11 mmHg] 10 mmHg  Vent Mode: CPAP;PSV FiO2 (%):  [40 %] 40 % Set Rate:  [20 bmp] 20 bmp Vt Set:  [500 mL] 500 mL PEEP:  [5 cmH20] 5 cmH20 Pressure Support:  [10 cmH20] 10 cmH20 Plateau Pressure:  [18 cmH20-20 cmH20] 18 cmH20   Intake/Output Summary (Last 24 hours) at 07/10/2020 0846 Last data filed at 07/10/2020 0600 Gross per 24 hour  Intake 2235.14 ml  Output 1306 ml  Net 929.14 ml   Filed Weights   07/05/20 0500 07/09/20 0500 07/10/20 0500  Weight: 64 kg 63.7 kg 64 kg    Examination:  General - cachectic Eyes - pupils reactive ENT - ETT in place Cardiac -  regular rate/rhythm, no murmur Chest - scattered rhonchi Abdomen - soft, non tender, + bowel sounds Extremities - 1+ edema Skin - no rashes Neuro - RASS 0, moves extremities   Resolved Hospital Problem list   ESBL E. 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 - followed by Dr. Ephriam Jenkins with [facility] - previously treated with rituximab >> last in November 2020 - defer further immunosuppressive therapy while she is being treated for infection  Pulmonary artery hypertension. Acute systolic CHF. Atrial flutter/atrial tachycardia with runs of NSVT - new this admission. - WHO group 1, 2, 3 - advanced heart failure team following - continue amiodarone  Acute metabolic encephalopathy from hypoglycemia, hypotension, hypoxia. Seizure in setting of hypoxia with respiratory failure. - RASS goal 0 to -1 - continue keppra  Hypernatremia. - free water - add D5W at 40 mg/hr - f/u BMET  Pulmonary embolism in June 2021. - holding eliquis in anticipation of tracheostomy on 10/04  Liver cirrhosis. - f/u LFTs intermittently - continue chronulac  Steroid induced hyperglycemia. - SSI  Severe protein calorie malnutrition. - tube feeds  Best practice:  Diet: tube feeding DVT prophylaxis: eliquis GI prophylaxis: protonix Mobility: bed rest Code Status: full Disposition: ICU  Labs    CMP Latest Ref Rng & Units 07/10/2020 07/09/2020 07/08/2020  Glucose 70 - 99 mg/dL 129(H) 192(H) 149(H)  BUN 6 - 20 mg/dL 64(H) 80(H) 88(H)  Creatinine 0.44 - 1.00 mg/dL 0.95 1.16(H) 1.07(H)  Sodium 135 - 145 mmol/L 150(H) 149(H) 150(H)  Potassium 3.5 - 5.1 mmol/L 4.0 4.4 4.3  Chloride 98 - 111 mmol/L  114(H) 114(H) 118(H)  CO2 22 - 32 mmol/L 25 24 24   Calcium 8.9 - 10.3 mg/dL 7.0(L) 8.5(L) 8.7(L)  Total Protein 6.5 - 8.1 g/dL - - 5.2(L)  Total Bilirubin 0.3 - 1.2 mg/dL - - 0.9  Alkaline Phos 38 - 126 U/L - - 128(H)  AST 15 - 41 U/L - - 43(H)  ALT 0 - 44 U/L - - 53(H)    CBC Latest Ref Rng & Units 07/10/2020 07/08/2020 07/07/2020  WBC 4.0 - 10.5 K/uL 20.9(H) 25.2(H) 20.5(H)  Hemoglobin 12.0 - 15.0 g/dL 9.5(L) 11.6(L) 11.7(L)  Hematocrit 36 - 46 % 32.7(L) 38.2 38.5  Platelets 150 - 400 K/uL 235 294 303    ABG    Component Value Date/Time   PHART 7.485 (H) 07/03/2020 0909   PCO2ART 33.2 07/03/2020 0909   PO2ART 116 (H) 07/03/2020 0909   HCO3 25.1 07/03/2020 0909   TCO2 26 07/03/2020 0909   ACIDBASEDEF 11.0 (H) 06/23/2020 1316   O2SAT 77.6 07/10/2020 0518    CBG (last 3)  Recent Labs    07/09/20 1955 07/09/20 2327 07/10/20 0352  GLUCAP 103* 162* 151*    Critical care time: 33 minutes  Chesley Mires, MD [place] Pager - [phone]  07/10/2020, 8:46 AM

## 2020-07-11 ENCOUNTER — Inpatient Hospital Stay (HOSPITAL_COMMUNITY): Payer: BC Managed Care – PPO

## 2020-07-11 DIAGNOSIS — R570 Cardiogenic shock: Secondary | ICD-10-CM | POA: Diagnosis not present

## 2020-07-11 DIAGNOSIS — I50810 Right heart failure, unspecified: Secondary | ICD-10-CM | POA: Diagnosis not present

## 2020-07-11 DIAGNOSIS — N170 Acute kidney failure with tubular necrosis: Secondary | ICD-10-CM | POA: Diagnosis not present

## 2020-07-11 DIAGNOSIS — J9601 Acute respiratory failure with hypoxia: Secondary | ICD-10-CM | POA: Diagnosis not present

## 2020-07-11 DIAGNOSIS — J80 Acute respiratory distress syndrome: Secondary | ICD-10-CM

## 2020-07-11 DIAGNOSIS — U071 COVID-19: Secondary | ICD-10-CM

## 2020-07-11 DIAGNOSIS — I272 Pulmonary hypertension, unspecified: Secondary | ICD-10-CM | POA: Diagnosis not present

## 2020-07-11 LAB — BASIC METABOLIC PANEL
Anion gap: 3 — ABNORMAL LOW (ref 5–15)
BUN: 64 mg/dL — ABNORMAL HIGH (ref 6–20)
CO2: 29 mmol/L (ref 22–32)
Calcium: 8.2 mg/dL — ABNORMAL LOW (ref 8.9–10.3)
Chloride: 112 mmol/L — ABNORMAL HIGH (ref 98–111)
Creatinine, Ser: 1.17 mg/dL — ABNORMAL HIGH (ref 0.44–1.00)
GFR calc Af Amer: 60 mL/min — ABNORMAL LOW (ref >60)
GFR calc non Af Amer: 52 mL/min — ABNORMAL LOW (ref >60)
Glucose, Bld: 105 mg/dL — ABNORMAL HIGH (ref 70–99)
Potassium: 5.1 mmol/L (ref 3.5–5.1)
Sodium: 144 mmol/L (ref 135–145)

## 2020-07-11 LAB — CBC
HCT: 35.5 % — ABNORMAL LOW (ref 36.0–46.0)
Hemoglobin: 10.5 g/dL — ABNORMAL LOW (ref 12.0–15.0)
MCH: 32.3 pg (ref 26.0–34.0)
MCHC: 29.6 g/dL — ABNORMAL LOW (ref 30.0–36.0)
MCV: 109.2 fL — ABNORMAL HIGH (ref 80.0–100.0)
Platelets: 263 10*3/uL (ref 150–400)
RBC: 3.25 MIL/uL — ABNORMAL LOW (ref 3.87–5.11)
RDW: 18.9 % — ABNORMAL HIGH (ref 11.5–15.5)
WBC: 17.9 10*3/uL — ABNORMAL HIGH (ref 4.0–10.5)
nRBC: 0 % (ref 0.0–0.2)

## 2020-07-11 LAB — COOXEMETRY PANEL
Carboxyhemoglobin: 1.3 % (ref 0.5–1.5)
Methemoglobin: 0.7 % (ref 0.0–1.5)
O2 Saturation: 82 %
Total hemoglobin: 11 g/dL — ABNORMAL LOW (ref 12.0–16.0)

## 2020-07-11 LAB — GLUCOSE, CAPILLARY
Glucose-Capillary: 109 mg/dL — ABNORMAL HIGH (ref 70–99)
Glucose-Capillary: 109 mg/dL — ABNORMAL HIGH (ref 70–99)
Glucose-Capillary: 123 mg/dL — ABNORMAL HIGH (ref 70–99)
Glucose-Capillary: 170 mg/dL — ABNORMAL HIGH (ref 70–99)
Glucose-Capillary: 78 mg/dL (ref 70–99)
Glucose-Capillary: 82 mg/dL (ref 70–99)

## 2020-07-11 LAB — PROTIME-INR
INR: 1.5 — ABNORMAL HIGH (ref 0.8–1.2)
Prothrombin Time: 17.2 seconds — ABNORMAL HIGH (ref 11.4–15.2)

## 2020-07-11 LAB — APTT: aPTT: 29 seconds (ref 24–36)

## 2020-07-11 MED ORDER — NOREPINEPHRINE 4 MG/250ML-% IV SOLN
INTRAVENOUS | Status: AC
  Administered 2020-07-11: 4 mg
  Filled 2020-07-11: qty 250

## 2020-07-11 MED ORDER — VECURONIUM BROMIDE 10 MG IV SOLR
10.0000 mg | Freq: Once | INTRAVENOUS | Status: AC
  Administered 2020-07-11: 10 mg via INTRAVENOUS
  Filled 2020-07-11: qty 10

## 2020-07-11 MED ORDER — FENTANYL CITRATE (PF) 100 MCG/2ML IJ SOLN
100.0000 ug | Freq: Once | INTRAMUSCULAR | Status: AC
  Administered 2020-07-11: 100 ug via INTRAVENOUS
  Filled 2020-07-11: qty 2

## 2020-07-11 MED ORDER — HEPARIN (PORCINE) 25000 UT/250ML-% IV SOLN
1000.0000 [IU]/h | INTRAVENOUS | Status: DC
  Filled 2020-07-11: qty 250

## 2020-07-11 MED ORDER — PHENYLEPHRINE 40 MCG/ML (10ML) SYRINGE FOR IV PUSH (FOR BLOOD PRESSURE SUPPORT)
PREFILLED_SYRINGE | INTRAVENOUS | Status: AC
  Filled 2020-07-11: qty 10

## 2020-07-11 MED ORDER — ETOMIDATE 2 MG/ML IV SOLN
20.0000 mg | Freq: Once | INTRAVENOUS | Status: AC
  Administered 2020-07-11: 20 mg via INTRAVENOUS
  Filled 2020-07-11: qty 10

## 2020-07-11 NOTE — Progress Notes (Signed)
NAME:  Carolyn Proctor, MRN:  [id number], DOB:  1961-07-24, LOS: 20 ADMISSION DATE:  06/22/2020, CONSULTATION DATE:  9/15 REFERRING MD:  Tyrone Nine, CHIEF COMPLAINT:  Confusion, refractory hypoglycemia   Brief History   69 yoF at home who complained to family she felt like she was having a stroke, then developed left facial droop and went unresponsive.  Required BVM by EMS and noted to be hypoglycemic and hypotensive.  Intubated in ER for airway protection.  CTH negative.  Refractory hypoglycemia despite multiple D50 amps and required a drip.  PCCM called to admit.   Past Medical History  PAF, DVT, PE on Eliquis HFpEF CKD stage III RA Hypercoagulable state (increased protein S antigen, decreased protein C activity, and decreased Antithrombin III) Centrilobular emphysema and pulmonary fibrosis- non smoker Chronic venous insufficiency w/ chronic foot ulcers  Diverticulitis   Significant Hospital Events   9/15 admitted/ intubated >> pulse dose steroids, diuresis, cardiogenic shock support 9/25 on full vent support, encephalopathy precluding extubation 9/26 not weaning, encephalopathic, dysautonomia, off milrinonoe 10/4 Trach planned  Consults:  Neurology Advanced heart failure  Procedures:  9/15 ETT >> 9/15 R IJ TL CVC >>9/27 9/27 PICC >  10/4 Trach planned  Significant Diagnostic Tests:   9/15 CTH >> negative  9/15 CTA chest/ abd/ pelvis >> significant dilatation of RA and RV consistent with R heart failure; moderate R and small L pleural effusions with associated atelectasis of both lower lobes; underlying chronic cystic and fibrotic lung disease; severe anasarca; nodular contour of liver suggestive of possible cirrhosis  9/15 Echo> LVEF 35-40%, mod decreased LV function, RVSP 75, severely reduced RV systolic function; R to left shunt noted, suggestive of PFO, RA severely dilated, mod pericardial effusion  9/15 CTA head and neck >> no significant carotid or vertebral artery  stenosis in neck, no LVO, right jugular central venous catheter with probable surrounding hematoma  9/18 MR BRAIN>> no acute intracranial abnormality  9/28 MRI brain > diffuse pachymeningeal enhancement, bilateral cerebral convexities, sub centimeter extra axial focus of T1 hyperintensity along R temporal occiptal lobe, new  Micro Data:  9/15 BCx2 central line >> NG5D 9/15 BCx2 PIV >> Strept mitis 9/15 SARS2 >> negative 9/15 UC >> ESBL E coli 9/16 Trach asp cult >> Normal flora 9/27 blood > negative 9/27 resp > e coli ESBL  Antimicrobials:  9/15 vanc >> 9/17 9/15 zosyn >> 9/20  9/27 vanc > 9/28 9/27 cefepime > 9/29 9/29 meropenem >   Interim history/subjective:  No acute events. Trach at 1pm today.  Objective   Blood pressure 103/68, pulse (!) 54, temperature 98.4 F (36.9 C), temperature source Rectal, resp. rate 12, height 5\' 11"  (1.803 m), weight 64 kg, last menstrual period 02/19/2013, SpO2 100 %. CVP:  [5 mmHg-7 mmHg] 6 mmHg  Vent Mode: PRVC FiO2 (%):  [40 %] 40 % Set Rate:  [20 bmp] 20 bmp Vt Set:  [500 mL] 500 mL PEEP:  [5 cmH20] 5 cmH20 Plateau Pressure:  [18 cmH20-20 cmH20] 19 cmH20   Intake/Output Summary (Last 24 hours) at 07/11/2020 0844 Last data filed at 07/11/2020 0600 Gross per 24 hour  Intake 3350.18 ml  Output 800 ml  Net 2550.18 ml   Filed Weights   07/05/20 0500 07/09/20 0500 07/10/20 0500  Weight: 64 kg 63.7 kg 64 kg    Examination: General: Adult female, frail, in NAD. Neuro: Sedated, MAE's. HEENT: /AT. Sclerae anicteric. ETT in place. Cardiovascular: RRR, no M/R/G.  Lungs: Respirations even and unlabored.  CTA bilaterally, No W/R/R. Abdomen: BS x 4, soft, NT/ND.  Musculoskeletal: No gross deformities, no edema.  Skin: Intact, warm, no rashes.   Resolved Hospital Problem list   ESBL E. 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 Ephriam Jenkins with [facility]). 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 Atrial flutter/atrial tachycardia with runs of NSVT - new this admission. - advanced heart failure team following - continue amiodarone  Acute metabolic encephalopathy from hypoglycemia, hypotension, hypoxia. Seizure in setting of hypoxia with respiratory failure. - RASS goal 0 to -1 - continue keppra  Hypernatremia. - free water, D5W - f/u BMET  Pulmonary embolism in June 2021. - holding eliquis in anticipation of tracheostomy on 10/04  Liver cirrhosis. - f/u LFTs intermittently - continue chronulac  Steroid induced hyperglycemia. - SSI  Severe protein calorie malnutrition. - tube feeds  Best practice:  Diet: tube feeding DVT prophylaxis: eliquis on hold for trach GI prophylaxis: protonix Mobility: bed rest Code Status: full Disposition: ICU   Critical care time: 30 minutes   Montey Hora, [facility] 07/11/2020, 8:53 AM

## 2020-07-11 NOTE — Progress Notes (Signed)
[facility] Progress Note Patient Name: Carolyn Proctor DOB: November 07, 1960 MRN: [id number]   Date of Service  07/11/2020  HPI/Events of Note  Nursing reports blackened digits: L hand - Index finger nail bed, middle finger nail bed and tip, ring finger tip and 5th finger nailbed and tip. Radial and ulner pulses present on L arm. R hand - index, middle and ring finger nailbed and tip. Thumb and 5th digit spared. R Radial pulse - good waveform on A-line and nurse not able to palpated R ulnar pulse.   eICU Interventions  Nurse to doppler R ulnar artery. However, digits should get collateral circulation from R radial artery.      Intervention Category Major Interventions: Other:  Lysle Dingwall 07/11/2020, 9:59 PM

## 2020-07-11 NOTE — Progress Notes (Addendum)
Advanced Heart Failure Rounding Note   Subjective:    Remains intubated. On for trach today. She is following commands.   Diuretics held over the weekend for low CVP. Wt stable ~141 lb. CVP 6 today. Co-ox 82% off milrinone.   BUN trending down, 80>>64. SCr 1.17. K 5.1  WBC trending down, 25>>20>>17. AF.  Day 6/7 on meropenum  Daughter present at bedside.    Objective:   Weight Range:  Vital Signs:   Temp:  [97.7 F (36.5 C)-98.9 F (37.2 C)] 98.4 F (36.9 C) (10/04 0400) Pulse Rate:  [54-108] 61 (10/04 0935) Resp:  [12-30] 19 (10/04 0935) BP: (91-135)/(50-83) 103/68 (10/04 0700) SpO2:  [100 %] 100 % (10/04 0935) Arterial Line BP: (98-118)/(50-65) 103/55 (10/04 0500) FiO2 (%):  [40 %] 40 % (10/04 0935) Last BM Date: 07/10/20  Weight change: Filed Weights   07/05/20 0500 07/09/20 0500 07/10/20 0500  Weight: 64 kg 63.7 kg 64 kg    Intake/Output:   Intake/Output Summary (Last 24 hours) at 07/11/2020 1028 Last data filed at 07/11/2020 0600 Gross per 24 hour  Intake 3273.41 ml  Output 800 ml  Net 2473.41 ml     Physical Exam: CVP 6 General:  Chronically ill/ cachetic appearing female, wake on vent HEENT: normal + ETT + cor-trak  Neck: supple. No JVD. Carotids 2+ bilat; no bruits. No lymphadenopathy or thryomegaly appreciated. Cor: PMI nondisplaced. Regular rate and rhythm. 2/6 TR Lungs: intubated. CTAB Abdomen: soft, nontender, nondistended. No hepatosplenomegaly. No bruits or masses. Good bowel sounds. Extremities: no cyanosis, clubbing, rash, 1+ bilateral LE ankle/pedal edema Neuro: intubated but awake on vent, alert, cranial nerves grossly intact. moves all 4 extremities w/o difficulty.    Telemetry: NSR 60s Personally reviewed   Labs: Basic Metabolic Panel: Recent Labs  Lab 07/07/20 0500 07/07/20 0500 07/08/20 0500 07/08/20 0500 07/09/20 0459 07/10/20 0518 07/11/20 0523  NA 151*  --  150*  --  149* 150* 144  K 3.8  --  4.3  --  4.4 4.0 5.1    CL 119*  --  118*  --  114* 114* 112*  CO2 23  --  24  --  '24 25 29  ' GLUCOSE 131*  --  149*  --  192* 129* 105*  BUN 93*  --  88*  --  80* 64* 64*  CREATININE 1.13*  --  1.07*  --  1.16* 0.95 1.17*  CALCIUM 8.8*   < > 8.7*   < > 8.5* 7.0* 8.2*   < > = values in this interval not displayed.    Liver Function Tests: Recent Labs  Lab 07/05/20 0513 07/06/20 0532 07/07/20 0500 07/08/20 0500  AST '23 26 31 ' 43*  ALT 35 33 43 53*  ALKPHOS 95 89 126 128*  BILITOT 1.3* 1.0 1.0 0.9  PROT 5.8* 5.3* 5.7* 5.2*  ALBUMIN 1.6* 1.5* 1.5* 1.4*   No results for input(s): LIPASE, AMYLASE in the last 168 hours. No results for input(s): AMMONIA in the last 168 hours.  CBC: Recent Labs  Lab 07/05/20 0513 07/05/20 0513 07/06/20 0532 07/07/20 0500 07/08/20 0500 07/10/20 0518 07/11/20 0523  WBC 27.9*   < > 23.0* 20.5* 25.2* 20.9* 17.9*  NEUTROABS 26.8*  --   --  19.1* 23.5*  --   --   HGB 11.8*   < > 11.6* 11.7* 11.6* 9.5* 10.5*  HCT 38.9   < > 39.0 38.5 38.2 32.7* 35.5*  MCV 107.2*   < >  108.9* 105.2* 107.3* 110.8* 109.2*  PLT 307   < > 296 303 294 235 263   < > = values in this interval not displayed.    Cardiac Enzymes: No results for input(s): CKTOTAL, CKMB, CKMBINDEX, TROPONINI in the last 168 hours.  BNP: BNP (last 3 results) No results for input(s): BNP in the last 8760 hours.  ProBNP (last 3 results) No results for input(s): PROBNP in the last 8760 hours.    Other results:  Imaging: DG Chest Port 1 View  Result Date: 07/10/2020 CLINICAL DATA:  Intubated EXAM: PORTABLE CHEST 1 VIEW COMPARISON:  07/08/2020 chest radiograph. FINDINGS: Endotracheal tube tip is 4.9 cm above the carina. Enteric tube enters stomach with the tip not seen on this image. Right PICC terminates over the lower third of the SVC. Stable cardiomediastinal silhouette with mild cardiomegaly. No pneumothorax. No pleural effusion. No overt pulmonary edema. Scattered reticular opacities in both lungs,  unchanged. IMPRESSION: 1. Well-positioned support structures. 2. Stable mild cardiomegaly without overt pulmonary edema. 3. Stable scattered reticular opacities in both lungs, favor scarring or atelectasis. Electronically Signed   By: Ilona Sorrel M.D.   On: 07/10/2020 07:25     Medications:     Scheduled Medications: . amiodarone  200 mg Per Tube Daily  . chlorhexidine gluconate (MEDLINE KIT)  15 mL Mouth Rinse BID  . Chlorhexidine Gluconate Cloth  6 each Topical Q0600  . feeding supplement (PROSource TF)  45 mL Per Tube TID  . free water  200 mL Per Tube Q4H  . influenza vac split quadrivalent PF  0.5 mL Intramuscular Tomorrow-1000  . insulin aspart  0-15 Units Subcutaneous Q4H  . insulin aspart  5 Units Subcutaneous Q4H  . lactulose  30 g Per Tube Daily  . levETIRAcetam  500 mg Per Tube BID  . mouth rinse  15 mL Mouth Rinse 10 times per day  . pantoprazole sodium  40 mg Per Tube Daily  . predniSONE  40 mg Per Tube Q breakfast  . sodium chloride flush  10-40 mL Intracatheter Q12H    Infusions: . sodium chloride Stopped (07/11/20 0533)  . dexmedetomidine (PRECEDEX) IV infusion 0.7 mcg/kg/hr (07/11/20 0600)  . dextrose 40 mL/hr at 07/11/20 0600  . feeding supplement (VITAL 1.5 CAL) Stopped (07/11/20 0008)  . meropenem (MERREM) IV 200 mL/hr at 07/11/20 0600    PRN Medications: Place/Maintain arterial line **AND** sodium chloride, acetaminophen (TYLENOL) oral liquid 160 mg/5 mL, fentaNYL, midazolam, polyethylene glycol, sodium chloride flush, white petrolatum    Plan/Discussion:    1. Shock  - initially cardiogenic shock due to severe RV failure/cor pulmonale  - Echo 06/22/20 Echo LV small compressed from RV EF 45-50% RV massively dilated and severely hypokinetic. Severe TR RVSP 76 Severe D-sign. Moderate pericardial effusion  - improved with milrinone and diuresis - On 9/27 developed component of septic shock. BCx NGTD. E coli (ESBL) in sputum - WBC improving on  cefepime/vanc. ESBL resistant to cefepime =>Switched to mero, day 6/7  - suspect fevers may also be related to vasculitic flare with peripheral vasoconstriction   2. [place] RV/failure - RH cath in 12/20 with mild PAH with normal RV function. - PFTs much better than I expected based on CT chest. DLCO markedly reduced - Echo 06/22/20 Echo LV small compressed from RV EF 45-50% RV massively dilated and severely hypokinetic. Severe TR RVSP 76 Severe D-sign. Moderate pericardial effusion  - Milrinone stopped 9/26. Co-ox remains stable at 82% - CVP 6. Wt stable.  BUN trending down. Continue to hold diuretics today  - RHC after extubation, hopefully tomorrow  3. Severe encephalopathy - etiology unclear - repeat head CT unrevealing.  - brain MRI on 9/18 with no evidence vasculitis (has received steroids). Repeat brain MRI with findings as above. (unclear significance)  - improved with increasing steroids   4. Acute hypoxic respiratory failure - multifactorial including PAH, ILD, large R effusion, pulmonary edema and possible aspiration  - Tracheal aspirate now with ESBL E.coli. Continue abx therapy w/ meropenum - planning trach today    5. 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 Down to 94 on 9/26.  - Remains on lactulose   6. CTD with h/o RA and SLE - Case d/w with Dr. [place] Bells. She has had rapid progression of RV failure along with mental status changes and lung infiltrates in setting of known CTD. Several markers elevated- - Agree that this is mostly likely CTD flare.  - Steroids have been increased. Would continue for extended course - Dr. Sherrin Daisy has discussed w/ her outpatient Rheumatologist. He feels that she has a vasculitis picture. - Given her underlying infections (currently ESBL) we can't use more aggressive immunosuppression other than prednisone at this time.  7. DVT/PE - diagnosed 6/21 at [facility] - CT  this admit negative for acute PE - Eliquis currently on hold for Trach   8. AFL/atrial tach with runs NSVT  - in NSR - Keep amio at 200 daily  9. Hypernatremia - sodium 146 -> 149->151->144 - continue to hold diuretics today - continue free water boluses    Length of Stay: [address], PA-C  07/11/2020, 10:28 AM  Advanced Heart Failure Team Pager [phone] (M-F; 7a - 4p)  Please contact [facility] for night-coverage after hours (4p -7a ) and weekends on [email or web address]  Agree with above.  Unable to wean from vent over the weekend due to respiratory muscle weakness. Remains on prednisone for CTD flare. Diuretics held due to hypernatremia. CVP 6-7 (checked personally). Co-ox 82% off milrinone. Follows commands and then falls back to sleep. Maintaining NSR. For trach today. Heparin on hold   General:  Intubated, Awake on vent. Weak appearing. No resp difficulty HEENT: normal + ETT Neck: supple. JVP 6-7 Carotids 2+ bilat; no bruits. No lymphadenopathy or thryomegaly appreciated. Cor: PMI nondisplaced. Regular rate & rhythm. 2/6 TR Lungs: clear Abdomen: soft, nontender, nondistended. No hepatosplenomegaly. No bruits or masses. Good bowel sounds. Extremities: no cyanosis, clubbing, rash, tr edema + dressings Neuro: awake on vent. Follows commands  Has failed vent wean. For trach today. Diuretics on hold. Hypernatremia has resolved. Volume status ok. Rhythm stable. Plan RHC later in week when more stable. Ok to leave off heparin for 24 hours if needed post-trach (on for PAF which has been quiescent recently and previous PE several months ago).   CRITICAL CARE Performed by: Glori Bickers  Total critical care time: 35 minutes  Critical care time was exclusive of separately billable procedures and treating other patients.  Critical care was necessary to treat or prevent imminent or life-threatening deterioration.  Critical care was time spent personally by me  (independent of midlevel providers or residents) on the following activities: development of treatment plan with patient and/or surrogate as well as nursing, discussions with consultants, evaluation of patient's response to treatment, examination of patient, obtaining history from patient or surrogate, ordering and performing treatments and interventions,  ordering and review of laboratory studies, ordering and review of radiographic studies, pulse oximetry and re-evaluation of patient's condition.  Glori Bickers, MD  9:45 PM

## 2020-07-11 NOTE — Progress Notes (Signed)
Contacted [facility] around 2055 to make them aware that trach site is still bleeding, and I was making sure that CCM still wishes to proceed with heparin drip. Contacted [facility] again around 2117 to let them know several of patient's fingertips on both hands are black and discolored. No new orders at this time. Will continue to monitor.

## 2020-07-11 NOTE — Progress Notes (Signed)
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), Afib, protein C deficiency  No Known Allergies  Patient Measurements: Height: 5\' 11"  (180.3 cm) Weight: 64 kg (141 lb 1.5 oz) IBW/kg (Calculated) : 70.8 Heparin Dosing Weight: 74.2 kg  Vital Signs: Temp: 98.4 F (36.9 C) (10/04 0400) Temp Source: Rectal (10/04 0400) BP: 103/68 (10/04 0700) Pulse Rate: 61 (10/04 0935)  Labs: Recent Labs    07/09/20 0459 07/10/20 0518 07/11/20 0523  HGB  --  9.5* 10.5*  HCT  --  32.7* 35.5*  PLT  --  235 263  APTT  --   --  29  LABPROT  --   --  17.2*  INR  --   --  1.5*  CREATININE 1.16* 0.95 1.17*    Estimated Creatinine Clearance: 53.6 mL/min (A) (by C-G formula based on SCr of 1.17 mg/dL (H)).  Medications:  Infusions:  . sodium chloride Stopped (07/11/20 0533)  . dexmedetomidine (PRECEDEX) IV infusion 0.7 mcg/kg/hr (07/11/20 0600)  . dextrose 40 mL/hr at 07/11/20 0600  . feeding supplement (VITAL 1.5 CAL) Stopped (07/11/20 0008)  . meropenem (MERREM) IV 200 mL/hr at 07/11/20 0600    Assessment: 88 yof on Apixaban  for recent hx DVT/PE in June 2021, also with hx Afib and protein S deficiency. Apixban was held after the 10/1 PM dose for planned trach 10/4. Pharmacy consulted to start a Heparin drip post-procedure while monitoring for bleeding with new trach. If stable - can transition back to Apixaban.   The patient's trach was placed around 1400 today. Per discussion with CCM, the plan is to resume Heparin ~6 hours after trach placement.  The patient was previously therapeutic at rates of 1000-1250 units/hr earlier this admit. Will monitor with both aPTT and HL initially given recent Apixaban use.   Goal of Therapy:  Heparin level 0.3-0.7 units/ml aPTT 66-102 seconds Monitor platelets by anticoagulation protocol: Yes   Plan:  - Start Heparin at 1000 units/hr (10 ml/hr) starting at 2000 this evening - Will  monitor both aPTT and HL initially given recent Apixaban use - until correlated - Will continue to monitor for any signs/symptoms of bleeding and will follow up with aPTT and HL 6 hours after initiation  Thank you for allowing pharmacy to be a part of this patient's care.  Alycia Rossetti, PharmD, BCPS Clinical Pharmacist Clinical phone for 07/11/2020: [phone] 07/11/2020 3:16 PM   **Pharmacist phone directory can now be found on [email or web address] ([email or web address]).  Listed under [place].

## 2020-07-11 NOTE — Procedures (Signed)
Diagnostic Bronchoscopy  Carolyn Proctor  [id number]  [birth year]  Date:07/11/20  Time:4:42 PM   Provider Performing:Yaxiel Minnie C Rai Severns   Procedure: Diagnostic Bronchoscopy (00511)  Indication(s) Assist with direct visualization of tracheostomy placement  Consent Risks of the procedure as well as the alternatives and risks of each were explained to the patient and/or caregiver.  Consent for the procedure was obtained.   Anesthesia See separate tracheostomy note   Time Out Verified patient identification, verified procedure, site/side was marked, verified correct patient position, special equipment/implants available, medications/allergies/relevant history reviewed, required imaging and test results available.   Sterile Technique Usual hand hygiene, masks, gowns, and gloves were used   Procedure Description Bronchoscope advanced through endotracheal tube and into airway.  After suctioning out tracheal secretions, bronchoscope used to provide direct visualization of tracheostomy placement.   Complications/Tolerance None; patient tolerated the procedure well.   EBL None  Specimen(s) None

## 2020-07-11 NOTE — Procedures (Signed)
Percutaneous Tracheostomy Procedure Note   Carolyn Proctor  [id number]  1960-10-12  Date:07/11/20  Time:2:29 PM   Provider Performing:Soha Thorup  Procedure: Percutaneous Tracheostomy with Bronchoscopic Guidance (31600)  Indication(s) Acute Hypoxic respiratory failure  Consent Risks of the procedure as well as the alternatives and risks of each were explained to the patient and/or caregiver.  Consent for the procedure was obtained.  Anesthesia Etomidate, Versed, Fentanyl, Vecuronium   Time Out Verified patient identification, verified procedure, site/side was marked, verified correct patient position, special equipment/implants available, medications/allergies/relevant history reviewed, required imaging and test results available.   Sterile Technique Maximal sterile technique including sterile barrier drape, hand hygiene, sterile gown, sterile gloves, mask, hair covering.    Procedure Description Appropriate anatomy identified by palpation.  Patient's neck prepped and draped in sterile fashion.  1% lidocaine with epinephrine was used to anesthetize skin overlying neck.  1.5cm incision made and blunt dissection performed until tracheal rings could be easily palpated.   Then a size 6 Shiley tracheostomy was placed under bronchoscopic visualization using usual Seldinger technique and serial dilation.   Bronchoscope confirmed placement above the carina.  Tracheostomy was sutured in place with adhesive pad to protect skin under pressure.    Patient connected to ventilator.   Complications/Tolerance None; patient tolerated the procedure well. Chest X-ray is ordered to confirm no post-procedural complication.   EBL Minimal   Specimen(s) None

## 2020-07-11 NOTE — Progress Notes (Signed)
[facility] Physician-Brief Progress Note Patient Name: Carolyn Proctor DOB: March 31, 1961 MRN: [id number]   Date of Service  07/11/2020  HPI/Events of Note  Patient bleeding from tracheostomy done today.   eICU Interventions  Plan: 1. Hold Heparin IV infusion until the patient is re-evaluated by the rounding team in AM.      Intervention Category Major Interventions: Other:  Colbi Schiltz Cornelia Copa 07/11/2020, 9:30 PM

## 2020-07-12 ENCOUNTER — Inpatient Hospital Stay (HOSPITAL_COMMUNITY): Payer: BC Managed Care – PPO

## 2020-07-12 DIAGNOSIS — I50810 Right heart failure, unspecified: Secondary | ICD-10-CM | POA: Diagnosis not present

## 2020-07-12 DIAGNOSIS — R570 Cardiogenic shock: Secondary | ICD-10-CM | POA: Diagnosis not present

## 2020-07-12 DIAGNOSIS — U071 COVID-19: Secondary | ICD-10-CM | POA: Diagnosis not present

## 2020-07-12 DIAGNOSIS — I272 Pulmonary hypertension, unspecified: Secondary | ICD-10-CM | POA: Diagnosis not present

## 2020-07-12 LAB — CBC
HCT: 38.2 % (ref 36.0–46.0)
Hemoglobin: 11.7 g/dL — ABNORMAL LOW (ref 12.0–15.0)
MCH: 32.7 pg (ref 26.0–34.0)
MCHC: 30.6 g/dL (ref 30.0–36.0)
MCV: 106.7 fL — ABNORMAL HIGH (ref 80.0–100.0)
Platelets: 336 10*3/uL (ref 150–400)
RBC: 3.58 MIL/uL — ABNORMAL LOW (ref 3.87–5.11)
RDW: 18.7 % — ABNORMAL HIGH (ref 11.5–15.5)
WBC: 17.6 10*3/uL — ABNORMAL HIGH (ref 4.0–10.5)
nRBC: 0 % (ref 0.0–0.2)

## 2020-07-12 LAB — HEPARIN LEVEL (UNFRACTIONATED): Heparin Unfractionated: 0.32 IU/mL (ref 0.30–0.70)

## 2020-07-12 LAB — COOXEMETRY PANEL
Carboxyhemoglobin: 1.3 % (ref 0.5–1.5)
Methemoglobin: 0.8 % (ref 0.0–1.5)
O2 Saturation: 80.2 %
Total hemoglobin: 9.5 g/dL — ABNORMAL LOW (ref 12.0–16.0)

## 2020-07-12 LAB — BASIC METABOLIC PANEL
Anion gap: 10 (ref 5–15)
BUN: 56 mg/dL — ABNORMAL HIGH (ref 6–20)
CO2: 19 mmol/L — ABNORMAL LOW (ref 22–32)
Calcium: 8 mg/dL — ABNORMAL LOW (ref 8.9–10.3)
Chloride: 111 mmol/L (ref 98–111)
Creatinine, Ser: 1.07 mg/dL — ABNORMAL HIGH (ref 0.44–1.00)
GFR calc Af Amer: >60 mL/min (ref >60)
GFR calc non Af Amer: 58 mL/min — ABNORMAL LOW (ref >60)
Glucose, Bld: 98 mg/dL (ref 70–99)
Potassium: 5 mmol/L (ref 3.5–5.1)
Sodium: 140 mmol/L (ref 135–145)

## 2020-07-12 LAB — GLUCOSE, CAPILLARY
Glucose-Capillary: 137 mg/dL — ABNORMAL HIGH (ref 70–99)
Glucose-Capillary: 92 mg/dL (ref 70–99)
Glucose-Capillary: 172 mg/dL — ABNORMAL HIGH (ref 70–99)
Glucose-Capillary: 95 mg/dL (ref 70–99)
Glucose-Capillary: 141 mg/dL — ABNORMAL HIGH (ref 70–99)

## 2020-07-12 LAB — MAGNESIUM: Magnesium: 2.2 mg/dL (ref 1.7–2.4)

## 2020-07-12 LAB — APTT: aPTT: 54 seconds — ABNORMAL HIGH (ref 24–36)

## 2020-07-12 LAB — PHOSPHORUS: Phosphorus: 3.7 mg/dL (ref 2.5–4.6)

## 2020-07-12 MED ORDER — SILVER SULFADIAZINE 1 % EX CREA
TOPICAL_CREAM | Freq: Two times a day (BID) | CUTANEOUS | Status: DC
  Administered 2020-07-12 – 2020-07-20 (×5): 1 via TOPICAL
  Filled 2020-07-12 (×3): qty 85

## 2020-07-12 MED ORDER — HEPARIN (PORCINE) 25000 UT/250ML-% IV SOLN
1350.0000 [IU]/h | INTRAVENOUS | Status: AC
  Administered 2020-07-12: 1000 [IU]/h via INTRAVENOUS
  Administered 2020-07-13: 1200 [IU]/h via INTRAVENOUS
  Administered 2020-07-14 – 2020-07-15 (×2): 1300 [IU]/h via INTRAVENOUS
  Administered 2020-07-16: 1350 [IU]/h via INTRAVENOUS
  Administered 2020-07-18 – 2020-07-19 (×3): 1450 [IU]/h via INTRAVENOUS
  Administered 2020-07-20: 1350 [IU]/h via INTRAVENOUS
  Filled 2020-07-12 (×9): qty 250

## 2020-07-12 MED ORDER — FUROSEMIDE 10 MG/ML IJ SOLN
20.0000 mg | Freq: Every day | INTRAMUSCULAR | Status: DC
  Administered 2020-07-12 – 2020-07-18 (×7): 20 mg via INTRAVENOUS
  Filled 2020-07-12: qty 2
  Filled 2020-07-12 (×4): qty 4
  Filled 2020-07-12: qty 2
  Filled 2020-07-12: qty 4

## 2020-07-12 MED ORDER — VITAL 1.5 CAL PO LIQD
1000.0000 mL | ORAL | Status: DC
  Administered 2020-07-12 – 2020-07-13 (×2): 1000 mL
  Filled 2020-07-12 (×3): qty 1000

## 2020-07-12 NOTE — Progress Notes (Addendum)
NAME:  Carolyn Proctor, MRN:  [id number], DOB:  [birth year], LOS: 48 ADMISSION DATE:  06/22/2020, CONSULTATION DATE:  9/15 REFERRING MD:  Tyrone Nine, CHIEF COMPLAINT:  Confusion, refractory hypoglycemia   Brief History   38 yoF at home who complained to family she felt like she was having a stroke, then developed left facial droop and went unresponsive.  Required BVM by EMS and noted to be hypoglycemic and hypotensive.  Intubated in ER for airway protection.  CTH negative.  Refractory hypoglycemia despite multiple D50 amps and required a drip.  PCCM called to admit.   Past Medical History  PAF, DVT, PE on Eliquis HFpEF CKD stage III RA Hypercoagulable state (increased protein S antigen, decreased protein C activity, and decreased Antithrombin III) Centrilobular emphysema and pulmonary fibrosis- non smoker Chronic venous insufficiency w/ chronic foot ulcers  Diverticulitis   Significant Hospital Events   9/15 admitted/ intubated >> pulse dose steroids, diuresis, cardiogenic shock support 9/25 on full vent support, encephalopathy precluding extubation 9/26 not weaning, encephalopathic, dysautonomia, off milrinonoe 10/4 Trach   Consults:  Neurology Advanced heart failure  Procedures:  9/15 ETT >> 9/15 R IJ TL CVC >>9/27 9/27 PICC >  10/4 Trach planned  Significant Diagnostic Tests:   9/15 CTH >> negative  9/15 CTA chest/ abd/ pelvis >> significant dilatation of RA and RV consistent with R heart failure; moderate R and small L pleural effusions with associated atelectasis of both lower lobes; underlying chronic cystic and fibrotic lung disease; severe anasarca; nodular contour of liver suggestive of possible cirrhosis  9/15 Echo> LVEF 35-40%, mod decreased LV function, RVSP 75, severely reduced RV systolic function; R to left shunt noted, suggestive of PFO, RA severely dilated, mod pericardial effusion  9/15 CTA head and neck >> no significant carotid or vertebral artery stenosis in  neck, no LVO, right jugular central venous catheter with probable surrounding hematoma  9/18 MR BRAIN>> no acute intracranial abnormality  9/28 MRI brain > diffuse pachymeningeal enhancement, bilateral cerebral convexities, sub centimeter extra axial focus of T1 hyperintensity along R temporal occiptal lobe, new  Micro Data:  9/15 BCx2 central line >> NG5D 9/15 BCx2 PIV >> Strept mitis 9/15 SARS2 >> negative 9/15 UC >> ESBL E coli 9/16 Trach asp cult >> Normal flora 9/27 blood > negative 9/27 resp > e coli ESBL  Antimicrobials:  9/15 vanc >> 9/17 9/15 zosyn >> 9/20  9/27 vanc > 9/28 9/27 cefepime > 9/29 9/29 meropenem > 10/5  Interim history/subjective:  Patient received trach yesterday due to continued hypoxia. No complications with procedure. Overnight patient was noted to have mild bleeding at trach site however Hb has continued to uptrend throughout the night and into the morning. No concerning for significant/ongoing blood loss. Heparin to be restarted.   No acute events overnight. This morning patient is sitting in bed with trach in place breathing easily over ventilator. Trach site appears without active bleeding. Vitals stable. Afebrile. Down-trending WBC. Minimal Vent settings with O2 sats 93%. Improving mental status.   Objective   Blood pressure 95/66, pulse 95, temperature 99 F (37.2 C), temperature source Rectal, resp. rate (!) 23, height 5\' 11"  (1.803 m), weight 64 kg, last menstrual period 02/19/2013, SpO2 97 %. CVP:  [0 mmHg-11 mmHg] 11 mmHg  Vent Mode: PSV;CPAP FiO2 (%):  [40 %] 40 % Set Rate:  [20 bmp] 20 bmp Vt Set:  [500 mL-510 mL] 510 mL PEEP:  [5 cmH20] 5 cmH20 Pressure Support:  [5 cmH20] 5 cmH20  Plateau Pressure:  [13 YPP50-93 cmH20] 13 cmH20   Intake/Output Summary (Last 24 hours) at 07/12/2020 1038 Last data filed at 07/12/2020 1000 Gross per 24 hour  Intake 2632.56 ml  Output 1600 ml  Net 1032.56 ml   Filed Weights   07/05/20 0500 07/09/20  0500 07/10/20 0500  Weight: 64 kg 63.7 kg 64 kg    Examination: General: frail women lying in bed, ventilated, NAD Neuro: A&Ox3, improving mental status compared to days prior, indicated by thumbs up/down or head nods HEENT: ETT  In place, dry/cracked lips, anicteric sclerae, EOMI,  Cardiovascular: RRR, normal S1/S2, no m/r/g Lungs: equal and symmetric chest rise, no increased WOB, clear to ausculation bilaterally, Abdomen: soft, bowel sounds appreciated Extremities: right distal finger tips appear gangrenous, hands and lower extremities appear edematous, Doppler's performed on all extremities with pulses intact Skin: several ulcers/wounds bandaged on lower extremities   Resolved Hospital Problem list   ESBL E. 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 Ephriam Jenkins with [facility]). 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 Atrial flutter/atrial tachycardia with runs of NSVT - new this admission. - advanced heart failure team following - continue amiodarone  Acute metabolic encephalopathy from hypoglycemia, hypotension, hypoxia. Seizure in setting of hypoxia with respiratory  failure. - RASS goal 0 to -1 - continue keppra  Pulmonary embolism in June 2021. - restarting heparin gtt   Bilateral Pressure Ulcers - Wound Consult - BID Dressing Change w/ silvadene  Hypernatremia (Resolved) - free water, D5W - f/u BMET  Liver cirrhosis. - f/u LFTs intermittently - continue lactulose, titrate~200 mL of stool per rectal tube  Steroid induced hyperglycemia. - SSI  Severe protein calorie malnutrition. - tube feeds  Best practice:  Diet: tube feeding DVT prophylaxis: heparin gtt GI prophylaxis: protonix Mobility: bed rest Code Status: full Disposition: ICU   Critical care time: 30 minutes   Carolyne Littles, MS4

## 2020-07-12 NOTE — Progress Notes (Addendum)
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), Afib, Protein C Deficiency  No Known Allergies  Patient Measurements: Height: 5\' 11"  (180.3 cm) Weight: 64 kg (141 lb 1.5 oz) IBW/kg (Calculated) : 70.8 Heparin Dosing Weight:  64 kg  Vital Signs: Temp: 98 F (36.7 C) (10/05 1600) Temp Source: Oral (10/05 1600) BP: 127/77 (10/05 1900) Pulse Rate: 103 (10/05 1900)  Labs: Recent Labs    07/10/20 0518 07/10/20 0518 07/11/20 0523 07/12/20 0538 07/12/20 1810  HGB 9.5*   < > 10.5* 11.7*  --   HCT 32.7*  --  35.5* 38.2  --   PLT 235  --  263 336  --   APTT  --   --  29  --  54*  LABPROT  --   --  17.2*  --   --   INR  --   --  1.5*  --   --   HEPARINUNFRC  --   --   --   --  0.32  CREATININE 0.95  --  1.17* 1.07*  --    < > = values in this interval not displayed.    Estimated Creatinine Clearance: 58.6 mL/min (A) (by C-G formula based on SCr of 1.07 mg/dL (H)).  Assessment: 59 yr old female on apixaban for recent hx DVT/PE in June 2021, also with hx afib and protein C deficiency. Apixaban was held after the 10/1 PM dose for planned trach 10/4. Pharmacy consulted to start heparin infusion post-procedure while monitoring for bleeding with new trach. If stable - can transition back to apixaban.   Heparin initiation was held on 10/4, due to bleeding around the trach site. Pharmacy discussed with CCM this AM and okay to start IV heparin today. Will monitor for bleeding and CBC. Will monitor anticoagulation with both aPTT and HL initially, given recent apixaban use.   Heparin level and aPTT ~6 hrs after heparin initiated at 1000 units/hr were 0.32 units/ml and 54 sec, respectively (aPTT is below the goal range for this pt; heparin still with some false elevation, due to recent apixaban use). H/H 11.7/38.2, platelets 336. Per RN, no issues with IV or bleeding observed; no bleeding around trach site.  Goal of  Therapy:  Heparin level 0.3-0.7 units/ml aPTT 66-102 seconds Monitor platelets by anticoagulation protocol: Yes   Plan:  Increase heparin infusion to 1200 units/hr Check aPTT, heparin level in 6 hrs Monitor daily aPTT, heparin level, CBC Monitor for signs/symptoms of bleeding  Thank you for allowing pharmacy to be a part of this patient's care.  Gillermina Hu, PharmD, BCPS, [facility] Clinical Pharmacist 07/12/2020 7:23 PM

## 2020-07-12 NOTE — Consult Note (Signed)
WOC Nurse Consult Note: Patient receiving care in [place]. Reason for Consult: "venous ulcers, bilateral lower extremities"  Wound type: Patient is third spacing fluid. The right foot is cold to touch. The left foot is warm to touch. Neither foot has a palpable DP or PT pulse. The RLE, just below the knee, has an area of purpura.  The left dorsal foot has two healed areas that have blackened areas around the healed wounds. The toes of the left foot are darkened.  The toes on the right foot are darkened, but not as dark as those on the left foot. Etiology perhaps due to third spacing.  There is some BLE pitting edema. Perhaps related to arterial insufficiency.  I saw an empty norepinephrine IV bag on the sink; med is not currently running.  Perhaps with a vasculitis component. The LLE at the ankle has intact fluid filled bullae. Some have been unroofed revealing a white wound bed. The lateral RLE just below the knee has a wound that is primarily pink, but there is some scattered black islands.  Pressure Injury POA: Yes/No/NA Measurement: Wound bed: Drainage (amount, consistency, odor) none Periwound: intact but fragile Dressing procedure/placement/frequency:  Apply Silvadene to RLE wound, cover with Mepitel Kellie Simmering [phone]), secure with Kerlex. Perform twice daily. LLE, apply Mepitel, secure with kerlex, perform daily. I have also ordered Prevalon heel lift boots. Monitor the wound area(s) for worsening of condition such as: Signs/symptoms of infection,  Increase in size,  Development of or worsening of odor, Development of pain, or increased pain at the affected locations.  Notify the medical team if any of these develop.  Thank you for the consult.  Discussed plan of care with the patient and bedside nurse.  [facility] nurse will not follow at this time.  Please re-consult the [place] team if needed.  Val Riles, RN, MSN, CWOCN, CNS-BC, pager [phone]

## 2020-07-12 NOTE — Progress Notes (Signed)
Nutrition Follow-up  DOCUMENTATION CODES:   Severe malnutrition in context of chronic illness  INTERVENTION:   Tube Feeding via Cortrak: Increase Vital 1.5 to 55 ml/hr Pro-Source 45 mL TID Provides 122 g of protein, 2100 kcals, 1003 mL of free water Meets 100% estimated calorie and protein needs  Total free water with TF plus free water flushes of 200 mL q 4 hours: 2203 mL   NUTRITION DIAGNOSIS:   Severe Malnutrition related to chronic illness as evidenced by severe muscle depletion, energy intake < or equal to 75% for > or equal to 1 month, moderate fat depletion, edema.  Being addressed via TF   GOAL:   Patient will meet greater than or equal to 90% of their needs  Met  MONITOR:   TF tolerance, I & O's  REASON FOR ASSESSMENT:   Consult, Ventilator Enteral/tube feeding initiation and management  ASSESSMENT:   Pt with PMH of PAF, DVT and PE dx 03/2019 on Eliquis, RA, HF, centrilobular emphysema, pulmonary fibrosis on daily prednisone, and chronic venous insufficiency with chronic foot ulcers.Admitted with cardiogenic shock due to severe RV failure/cor pulmonale CTD flare.  09/15 Admitted  10/01 Cortrak placed 10/04 Trach placed  Pt remains on vent support via trach, ATC trials as able  Tolerating Vital 1.5 at 50 ml/hr, Pro-Source TF 45 ml, free water 200 mL q 4 hours, TID via Cortrak   Current wt 64 kg; weight has been relatively stable over the past 1 to 2 weeks. Net negative 5 L; noted some missing urine occurrences. Admit weight 74 kg  Noted dry gangrene of left fingers  Pt with liquid stool via rectal tube but on lactulose for cirrhosis  Hypernatremia has resolved with free water administration  Labs: sodium 140 (wdl), potassium 5.0 (wdl), Creatinine 1.07, BUN 56 Meds: D5 at 25 ml/hr, lasix, ss novolog, novolog q 4 hours, lactulose, prednisone   Diet Order:   Diet Order            Diet NPO time specified  Diet effective midnight                  EDUCATION NEEDS:   No education needs have been identified at this time  Skin:  Skin Assessment: Skin Integrity Issues: Skin Integrity Issues:: Other (Comment) Other: bilateral blisters/ulcers to legs, dry gangrene L fingers  Last BM:  10/5 200 mL rectal tube  Height:   Ht Readings from Last 1 Encounters:  06/22/20 '5\' 11"'  (1.803 m)    Weight:   Wt Readings from Last 1 Encounters:  07/10/20 64 kg    Ideal Body Weight:  70.4 kg  BMI:  Body mass index is 19.68 kg/m.  Estimated Nutritional Needs:   Kcal:  7290-2111 kcals  Protein:  100-130 grams  Fluid:  >/= 1.8 L   Kerman Passey MS, RDN, LDN, CNSC Registered Dietitian III Clinical Nutrition RD Pager and On-Call Pager Number Located in [place]

## 2020-07-12 NOTE — Progress Notes (Signed)
Advanced Heart Failure Rounding Note   Subjective:    Underwent trach yesterday. Had some bleeding initially from site but now resolved. Heparin restarted.  On vent through trach. Awake but seems somewhat lethargic.   Co-ox 80% CVP 6-7. Weight up 1 pound.  Objective:   Weight Range:  Vital Signs:   Temp:  [97.4 F (36.3 C)-99 F (37.2 C)] 97.4 F (36.3 C) (10/05 2000) Pulse Rate:  [70-103] 89 (10/05 2000) Resp:  [15-39] 15 (10/05 2100) BP: (94-152)/(61-80) 123/80 (10/05 2100) SpO2:  [93 %-100 %] 97 % (10/05 2100) Arterial Line BP: (111-155)/(54-78) 155/75 (10/05 2100) FiO2 (%):  [40 %] 40 % (10/05 2000) Last BM Date: 07/12/20  Weight change: Filed Weights   07/05/20 0500 07/09/20 0500 07/10/20 0500  Weight: 64 kg 63.7 kg 64 kg    Intake/Output:   Intake/Output Summary (Last 24 hours) at 07/12/2020 2139 Last data filed at 07/12/2020 2137 Gross per 24 hour  Intake 3475.21 ml  Output 2150 ml  Net 1325.21 ml     Physical Exam: General:  Chronically ill/ cachetic appearing female. On vent through trach. Awake but lethargic HEENT: normal Neck: supple.JVP 6 + trach  Carotids 2+ bilat; no bruits. No lymphadenopathy or thryomegaly appreciated. Cor: PMI nondisplaced. Regular rate & rhythm. 2/6 TR Lungs: clear Abdomen: soft, nontender, nondistended. No hepatosplenomegaly. No bruits or masses. Good bowel sounds. Extremities: no cyanosis, clubbing, rash, 1-2+ edema + TED hose  Neuro: awake on vent. Lethargic but follows commands   Telemetry: NSR 80-90 Personally reviewed    Labs: Basic Metabolic Panel: Recent Labs  Lab 07/08/20 0500 07/08/20 0500 07/09/20 0459 07/09/20 0459 07/10/20 0518 07/11/20 0523 07/12/20 0538  NA 150*  --  149*  --  150* 144 140  K 4.3  --  4.4  --  4.0 5.1 5.0  CL 118*  --  114*  --  114* 112* 111  CO2 24  --  24  --  25 29 19*  GLUCOSE 149*  --  192*  --  129* 105* 98  BUN 88*  --  80*  --  64* 64* 56*  CREATININE 1.07*  --   1.16*  --  0.95 1.17* 1.07*  CALCIUM 8.7*   < > 8.5*   < > 7.0* 8.2* 8.0*  MG  --   --   --   --   --   --  2.2  PHOS  --   --   --   --   --   --  3.7   < > = values in this interval not displayed.    Liver Function Tests: Recent Labs  Lab 07/06/20 0532 07/07/20 0500 07/08/20 0500  AST 26 31 43*  ALT 33 43 53*  ALKPHOS 89 126 128*  BILITOT 1.0 1.0 0.9  PROT 5.3* 5.7* 5.2*  ALBUMIN 1.5* 1.5* 1.4*   No results for input(s): LIPASE, AMYLASE in the last 168 hours. No results for input(s): AMMONIA in the last 168 hours.  CBC: Recent Labs  Lab 07/07/20 0500 07/08/20 0500 07/10/20 0518 07/11/20 0523 07/12/20 0538  WBC 20.5* 25.2* 20.9* 17.9* 17.6*  NEUTROABS 19.1* 23.5*  --   --   --   HGB 11.7* 11.6* 9.5* 10.5* 11.7*  HCT 38.5 38.2 32.7* 35.5* 38.2  MCV 105.2* 107.3* 110.8* 109.2* 106.7*  PLT 303 294 235 263 336    Cardiac Enzymes: No results for input(s): CKTOTAL, CKMB, CKMBINDEX, TROPONINI in the last 168  hours.  BNP: BNP (last 3 results) No results for input(s): BNP in the last 8760 hours.  ProBNP (last 3 results) No results for input(s): PROBNP in the last 8760 hours.    Other results:  Imaging: DG Chest Port 1 View  Result Date: 07/12/2020 CLINICAL DATA:  Respiratory failure. EXAM: PORTABLE CHEST 1 VIEW COMPARISON:  07/11/2020 FINDINGS: 0545 hours. Tracheostomy tube again noted in situ. A feeding tube passes into the stomach although the distal tip position is not included on the film. Lungs are hyperexpanded. Interstitial markings are diffusely coarsened with chronic features. Right PICC line tip overlies the distal SVC. Telemetry leads overlie the chest. IMPRESSION: 1. Stable exam. Hyperexpansion with chronic interstitial coarsening. No new or progressive findings. 2. Support apparatus as described. Electronically Signed   By: Misty Stanley M.D.   On: 07/12/2020 07:33   DG CHEST PORT 1 VIEW  Result Date: 07/11/2020 CLINICAL DATA:  Post tracheal plasty  EXAM: PORTABLE CHEST 1 VIEW COMPARISON:  Portable exam 1523 hours compared to 07/10/2020 FINDINGS: New tracheostomy tube with tip projecting over tracheal air column 6.8 cm above carina. Feeding tube extends into stomach. RIGHT arm PICC line tip projecting over SVC. Upper normal size of cardiac silhouette. Mediastinal contours normal. Persistent interstitial prominence throughout both lungs slightly greater on RIGHT which could represent chronic interstitial disease, atypical infection or edema unchanged. No pleural effusion or pneumothorax. IMPRESSION: New tracheostomy tube. Chronic interstitial thickening throughout both lungs question chronic interstitial disease though cannot completely exclude atypical infection and pulmonary edema. Electronically Signed   By: Lavonia Dana M.D.   On: 07/11/2020 15:41     Medications:     Scheduled Medications: . amiodarone  200 mg Per Tube Daily  . chlorhexidine gluconate (MEDLINE KIT)  15 mL Mouth Rinse BID  . Chlorhexidine Gluconate Cloth  6 each Topical Q0600  . feeding supplement (PROSource TF)  45 mL Per Tube TID  . free water  200 mL Per Tube Q4H  . furosemide  20 mg Intravenous Daily  . influenza vac split quadrivalent PF  0.5 mL Intramuscular Tomorrow-1000  . insulin aspart  0-15 Units Subcutaneous Q4H  . insulin aspart  5 Units Subcutaneous Q4H  . lactulose  30 g Per Tube Daily  . levETIRAcetam  500 mg Per Tube BID  . mouth rinse  15 mL Mouth Rinse 10 times per day  . pantoprazole sodium  40 mg Per Tube Daily  . predniSONE  40 mg Per Tube Q breakfast  . silver sulfADIAZINE   Topical BID  . sodium chloride flush  10-40 mL Intracatheter Q12H    Infusions: . sodium chloride Stopped (07/12/20 1918)  . dexmedetomidine (PRECEDEX) IV infusion 0.3 mcg/kg/hr (07/12/20 2100)  . dextrose Stopped (07/12/20 0103)  . feeding supplement (VITAL 1.5 CAL) 1,000 mL (07/12/20 1820)  . heparin 1,200 Units/hr (07/12/20 2100)  . meropenem (MERREM) IV Stopped  (07/12/20 1532)    PRN Medications: Place/Maintain arterial line **AND** sodium chloride, acetaminophen (TYLENOL) oral liquid 160 mg/5 mL, fentaNYL, midazolam, polyethylene glycol, sodium chloride flush, white petrolatum    Plan/Discussion:    1. Shock  - initially cardiogenic shock due to severe RV failure/cor pulmonale  - Echo 06/22/20 Echo LV small compressed from RV EF 45-50% RV massively dilated and severely hypokinetic. Severe TR RVSP 76 Severe D-sign. Moderate pericardial effusion  - improved with milrinone and diuresis. Now off milrinone. Co-ox 80% - On 9/27 developed component of septic shock. BCx NGTD. E coli (ESBL) in  sputum - WBC improving on cefepime/vanc. ESBL resistant to cefepime =>Switched to mero, day 7/7. T max 99.0  2. [place] RV/failure - RH cath in 12/20 with mild PAH with normal RV function. - PFTs much better than I expected based on CT chest. DLCO markedly reduced - Echo 06/22/20 Echo LV small compressed from RV EF 45-50% RV massively dilated and severely hypokinetic. Severe TR RVSP 76 Severe D-sign. Moderate pericardial effusion  - Milrinone stopped 9/26. Co-ox remains stable at 80% - CVP 6. Wt stable. Developing recurrent edema. Got 1 dose IV lasix today. I will stop free water for now.  - will eventually need RHC as improved. Potentially Friday?  - would be careful not to wean steroids too quickly given severity of CTD fare  3. Severe encephalopathy - etiology unclear - repeat head CT unrevealing.  - brain MRI on 9/18 with no evidence vasculitis (has received steroids). Repeat brain MRI with findings as above. (unclear significance)  - improved with increasing steroids  4. Acute hypoxic respiratory failure - multifactorial including PAH, ILD, large R effusion, pulmonary edema and possible aspiration  - Tracheal aspirate now with ESBL E.coli. Continue abx therapy w/ meropenum - s/p trech 10/4  5. Cirrhosis with acute on chronic liver failure - suspect due  to chronic RV failure - NH3 157 on admit but trended down. Down to 84 on 9/26.  - Remains on lactulose   6. CTD with h/o RA and SLE - Case d/w with Dr. [place] Bells. She has had rapid progression of RV failure along with mental status changes and lung infiltrates in setting of known CTD. Several markers elevated- - Agree that this is mostly likely CTD flare.  - Steroids have been increased. Would continue for extended course - Dr. Sherrin Daisy has discussed w/ her outpatient Rheumatologist. He feels that she has a vasculitis picture. - Given her underlying infections (currently ESBL) we can't use more aggressive immunosuppression other than prednisone at this time. Would be careful with steroid taper  7. DVT/PE - diagnosed 6/21 at [facility] - CT this admit negative for acute PE - Back on heparin. Can switch to Eliquis soon   8. AFL/atrial tach with runs NSVT  - in NSR - Keep amio at 200 daily  9. Hypernatremia - sodium 146 -> 149->151->144-> 140 - continue to hold diuretics today - will stop free water for now   CRITICAL CARE Performed by: Glori Bickers  Total critical care time: 35 minutes  Critical care time was exclusive of separately billable procedures and treating other patients.  Critical care was necessary to treat or prevent imminent or life-threatening deterioration.  Critical care was time spent personally by me (independent of midlevel providers or residents) on the following activities: development of treatment plan with patient and/or surrogate as well as nursing, discussions with consultants, evaluation of patient's response to treatment, examination of patient, obtaining history from patient or surrogate, ordering and performing treatments and interventions, ordering and review of laboratory studies, ordering and review of radiographic studies, pulse oximetry and re-evaluation of patient's condition.   Length of Stay: A[place], MD 07/12/2020, 9:39  PM  Advanced Heart Failure Team Pager [phone] (M-F; 7a - 4p)  Please contact [facility] for night-coverage after hours (4p -7a ) and weekends on [email or web address]

## 2020-07-12 NOTE — Progress Notes (Signed)
Pt placed on 40% ATC and is tolerating well at this time. RN aware. RT to continue to monitor. 

## 2020-07-12 NOTE — Progress Notes (Signed)
2 IV [facility] RNs assessed patient with [place] IV. Poor veins noted in both arms. Current PICC still working. RN aware.

## 2020-07-12 NOTE — Progress Notes (Signed)
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), Afib, protein C deficiency  No Known Allergies  Patient Measurements: Height: 5\' 11"  (180.3 cm) Weight: 64 kg (141 lb 1.5 oz) IBW/kg (Calculated) : 70.8 Heparin Dosing Weight: 74.2 kg  Vital Signs: BP: 106/68 (10/05 0800) Pulse Rate: 87 (10/05 0800)  Labs: Recent Labs    07/10/20 0518 07/10/20 0518 07/11/20 0523 07/12/20 0538  HGB 9.5*   < > 10.5* 11.7*  HCT 32.7*  --  35.5* 38.2  PLT 235  --  263 336  APTT  --   --  29  --   LABPROT  --   --  17.2*  --   INR  --   --  1.5*  --   CREATININE 0.95  --  1.17* 1.07*   < > = values in this interval not displayed.    Estimated Creatinine Clearance: 58.6 mL/min (A) (by C-G formula based on SCr of 1.07 mg/dL (H)).  Medications:  Infusions:  . sodium chloride Stopped (07/12/20 0728)  . dexmedetomidine (PRECEDEX) IV infusion 0.6 mcg/kg/hr (07/12/20 0915)  . dextrose Stopped (07/12/20 0103)  . feeding supplement (VITAL 1.5 CAL) 1,000 mL (07/11/20 2232)  . heparin    . meropenem (MERREM) IV 200 mL/hr at 07/12/20 0800    Assessment: 23 yof on Apixaban  for recent hx DVT/PE in June 2021, also with hx Afib and protein S deficiency. Apixban was held after the 10/1 PM dose for planned trach 10/4. Pharmacy consulted to start a Heparin drip post-procedure while monitoring for bleeding with new trach. If stable - can transition back to Apixaban.   Heparin initiation was held on 10/4 due to bleeding around the trach site. Discussed with CCM this AM and okay to start Heparin drip. Will monitor for bleeding and CBC.   The patient was previously therapeutic at rates of 1000-1250 units/hr earlier this admit. Will monitor with both aPTT and HL initially given recent Apixaban use.   Goal of Therapy:  Heparin level 0.3-0.7 units/ml aPTT 66-102 seconds Monitor platelets by anticoagulation protocol: Yes   Plan:  - Start  Heparin at 1000 units/hr (10 ml/hr) - Will continue to monitor for any signs/symptoms of bleeding and will follow up with aPTT and HL 6 hours after initiation  Thank you for allowing pharmacy to be a part of this patient's care.  Alycia Rossetti, PharmD, BCPS Clinical Pharmacist Clinical phone for 07/12/2020: [phone] 07/12/2020 9:26 AM   **Pharmacist phone directory can now be found on [email or web address] ([email or web address]).  Listed under [place].

## 2020-07-13 LAB — COOXEMETRY PANEL
Carboxyhemoglobin: 1.3 % (ref 0.5–1.5)
Methemoglobin: 0.7 % (ref 0.0–1.5)
O2 Saturation: 66 %
Total hemoglobin: 11.3 g/dL — ABNORMAL LOW (ref 12.0–16.0)

## 2020-07-13 LAB — CBC
HCT: 38.6 % (ref 36.0–46.0)
Hemoglobin: 12.2 g/dL (ref 12.0–15.0)
MCH: 33.2 pg (ref 26.0–34.0)
MCHC: 31.6 g/dL (ref 30.0–36.0)
MCV: 104.9 fL — ABNORMAL HIGH (ref 80.0–100.0)
Platelets: 391 10*3/uL (ref 150–400)
RBC: 3.68 MIL/uL — ABNORMAL LOW (ref 3.87–5.11)
RDW: 19.2 % — ABNORMAL HIGH (ref 11.5–15.5)
WBC: 22.7 10*3/uL — ABNORMAL HIGH (ref 4.0–10.5)
nRBC: 0 % (ref 0.0–0.2)

## 2020-07-13 LAB — GLUCOSE, CAPILLARY
Glucose-Capillary: 104 mg/dL — ABNORMAL HIGH (ref 70–99)
Glucose-Capillary: 119 mg/dL — ABNORMAL HIGH (ref 70–99)
Glucose-Capillary: 128 mg/dL — ABNORMAL HIGH (ref 70–99)
Glucose-Capillary: 131 mg/dL — ABNORMAL HIGH (ref 70–99)
Glucose-Capillary: 132 mg/dL — ABNORMAL HIGH (ref 70–99)
Glucose-Capillary: 139 mg/dL — ABNORMAL HIGH (ref 70–99)
Glucose-Capillary: 147 mg/dL — ABNORMAL HIGH (ref 70–99)
Glucose-Capillary: 63 mg/dL — ABNORMAL LOW (ref 70–99)
Glucose-Capillary: 65 mg/dL — ABNORMAL LOW (ref 70–99)
Glucose-Capillary: 67 mg/dL — ABNORMAL LOW (ref 70–99)
Glucose-Capillary: 92 mg/dL (ref 70–99)

## 2020-07-13 LAB — BASIC METABOLIC PANEL
Anion gap: 10 (ref 5–15)
BUN: 52 mg/dL — ABNORMAL HIGH (ref 6–20)
CO2: 20 mmol/L — ABNORMAL LOW (ref 22–32)
Calcium: 8.1 mg/dL — ABNORMAL LOW (ref 8.9–10.3)
Chloride: 110 mmol/L (ref 98–111)
Creatinine, Ser: 1.13 mg/dL — ABNORMAL HIGH (ref 0.44–1.00)
GFR calc non Af Amer: 54 mL/min — ABNORMAL LOW (ref >60)
Glucose, Bld: 119 mg/dL — ABNORMAL HIGH (ref 70–99)
Potassium: 4.9 mmol/L (ref 3.5–5.1)
Sodium: 140 mmol/L (ref 135–145)

## 2020-07-13 LAB — HEPARIN LEVEL (UNFRACTIONATED)
Heparin Unfractionated: 0.5 IU/mL (ref 0.30–0.70)
Heparin Unfractionated: 0.41 IU/mL (ref 0.30–0.70)

## 2020-07-13 LAB — APTT
aPTT: 75 seconds — ABNORMAL HIGH (ref 24–36)
aPTT: 64 seconds — ABNORMAL HIGH (ref 24–36)
aPTT: 37 seconds — ABNORMAL HIGH (ref 24–36)

## 2020-07-13 MED ORDER — DEXTROSE 50 % IV SOLN
INTRAVENOUS | Status: AC
  Filled 2020-07-13: qty 50

## 2020-07-13 MED ORDER — DEXTROSE 50 % IV SOLN
12.5000 g | Freq: Once | INTRAVENOUS | Status: AC
  Administered 2020-07-13: 12.5 g via INTRAVENOUS
  Filled 2020-07-13: qty 50

## 2020-07-13 MED ORDER — FENTANYL CITRATE (PF) 100 MCG/2ML IJ SOLN: 50.0000 ug | Freq: Once | INTRAMUSCULAR | Status: AC

## 2020-07-13 MED ORDER — MELATONIN 3 MG PO TABS
3.0000 mg | ORAL_TABLET | Freq: Every evening | ORAL | Status: DC | PRN
  Administered 2020-07-16 – 2020-07-18 (×3): 3 mg
  Filled 2020-07-13 (×5): qty 1

## 2020-07-13 MED ORDER — FENTANYL CITRATE (PF) 100 MCG/2ML IJ SOLN
INTRAMUSCULAR | Status: AC
  Administered 2020-07-13: 50 ug via INTRAVENOUS
  Filled 2020-07-13: qty 2

## 2020-07-13 NOTE — Progress Notes (Signed)
Inpatient Diabetes Program Recommendations  AACE/ADA: New Consensus Statement on Inpatient Glycemic Control (2015)  Target Ranges:  Prepandial:   less than 140 mg/dL      Peak postprandial:   less than 180 mg/dL (1-2 hours)      Critically ill patients:  140 - 180 mg/dL   Lab Results  Component Value Date   GLUCAP 132 (H) 07/13/2020   HGBA1C 5.5 06/23/2020    Review of Glycemic Control Results for Carolyn Proctor, Carolyn Proctor (MRN [id number]) as of 07/13/2020 10:50  Ref. Range 07/13/2020 03:14 07/13/2020 03:54 07/13/2020 07:47 07/13/2020 07:55 07/13/2020 07:56  Glucose-Capillary Latest Ref Range: 70 - 99 mg/dL 65 (L) 139 (H) 63 (L) 128 (H) 132 (H)    Inpatient Diabetes Program Recommendations:    Novlog 0-9 units q4h  Will continue to follow while inpatient.  Thank you, Reche Dixon, RN, BSN Diabetes Coordinator Inpatient Diabetes Program [phone] (team pager from 8a-5p)

## 2020-07-13 NOTE — Progress Notes (Signed)
ANTICOAGULATION CONSULT NOTE - Follow Up Consult  Pharmacy Consult for heparin Indication: protein C deficiency, Afib, and recent h/o VTE  Labs: Recent Labs    07/10/20 0518 07/10/20 0518 07/11/20 0523 07/12/20 0538 07/12/20 1810 07/13/20 0206  HGB 9.5*   < > 10.5* 11.7*  --   --   HCT 32.7*  --  35.5* 38.2  --   --   PLT 235  --  263 336  --   --   APTT  --   --  29  --  54* 75*  LABPROT  --   --  17.2*  --   --   --   INR  --   --  1.5*  --   --   --   HEPARINUNFRC  --   --   --   --  0.32 0.50  CREATININE 0.95  --  1.17* 1.07*  --   --    < > = values in this interval not displayed.    Assessment/Plan:  59yo female therapeutic on heparin after rate change. Will continue gtt at current rate and confirm stable with additional labs.   Wynona Neat, PharmD, BCPS  07/13/2020,3:20 AM

## 2020-07-13 NOTE — Progress Notes (Signed)
[facility] Progress Note Patient Name: Carolyn Proctor DOB: Mar 13, 1961 MRN: [id number]   Date of Service  07/13/2020  HPI/Events of Note  Request for sleep aid.  eICU Interventions  Plan: 1. Melatonin 3 mg per tube Q HS PRN sleep.      Intervention Category Major Interventions: Other:  Lysle Dingwall 07/13/2020, 9:41 PM

## 2020-07-13 NOTE — Progress Notes (Signed)
Upon insertion of foley catheter, bloody urine noted. Heparin gtt turned off and notified MD, awaiting reply. Report given to night shift. Will continue to monitor closely.

## 2020-07-13 NOTE — Progress Notes (Signed)
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), Afib, protein C deficiency  No Known Allergies  Patient Measurements: Height: 5\' 11"  (180.3 cm) Weight: 64 kg (141 lb 1.5 oz) IBW/kg (Calculated) : 70.8 Heparin Dosing Weight: 74.2 kg  Vital Signs: Temp: 99.8 F (37.7 C) (10/06 1600) Temp Source: Rectal (10/06 1600) BP: 123/72 (10/06 1700) Pulse Rate: 107 (10/06 1700)  Labs: Recent Labs    07/11/20 0523 07/11/20 0523 07/12/20 0538 07/12/20 1810 07/12/20 1810 07/13/20 0206 07/13/20 1055 07/13/20 1943  HGB 10.5*   < > 11.7*  --   --   --  12.2  --   HCT 35.5*  --  38.2  --   --   --  38.6  --   PLT 263  --  336  --   --   --  391  --   APTT 29   < >  --  54*   < > 75* 64* 37*  LABPROT 17.2*  --   --   --   --   --   --   --   INR 1.5*  --   --   --   --   --   --   --   HEPARINUNFRC  --   --   --  0.32  --  0.50 0.41  --   CREATININE 1.17*  --  1.07*  --   --   --  1.13*  --    < > = values in this interval not displayed.    Estimated Creatinine Clearance: 55.5 mL/min (A) (by C-G formula based on SCr of 1.13 mg/dL (H)).  Medications:  Infusions:  . sodium chloride Stopped (07/13/20 0007)  . dextrose Stopped (07/12/20 0103)  . feeding supplement (VITAL 1.5 CAL) 1,000 mL (07/13/20 1658)  . heparin Stopped (07/13/20 1845)    Assessment: 10 yof on Apixaban  for recent hx DVT/PE in June 2021, also with hx Afib and protein S deficiency. Apixban was held after the 10/1 PM dose for planned trach 10/4. Pharmacy consulted to start a Heparin drip post-procedure while monitoring for bleeding with new trach. If stable - can transition back to Apixaban.   Heparin initiation was held on 10/4 due to bleeding around the trach site but resumed on 10/5 per discussion with CCM.  Patient with bloody urine after foley catheter and heparin turned off. aPTT tonight is low but drawn 45 minutes after infusion stopped  (aPTT 37s, goal of 66-102). Patient with bloody urine after foley catheter and heparin turned off. Nurse noted contacting MD, will continue to follow peripherally for now.   Goal of Therapy:  Heparin level 0.3-0.7 units/ml aPTT 66-102 seconds Monitor platelets by anticoagulation protocol: Yes   Plan:  Will follow up anticoagulation plan once bleeding improved/stable  Thank you for allowing pharmacy to be a part of this patient's care.  Erin Hearing PharmD., BCPS Clinical Pharmacist 07/13/2020 8:17 PM  **Pharmacist phone directory can now be found on [email or web address] ([email or web address]).  Listed under [place].

## 2020-07-13 NOTE — Progress Notes (Signed)
Updated E-Link RN about patient's bloody urine and bloody secretions from trach. Heparin drip was stopped on day shift d/t bloody urine. This RN is inquiring about whether MD wishes to resume heparin drip, and whether to continue with transfer orders. Patient has not been sleeping well the previous couple of nights for this RN, so I also asked about getting something to help with sleep. Also, no per tube pain medications, tylenol order is for fever. I asked about getting a per tube pain medication. Will continue to monitor.

## 2020-07-13 NOTE — Progress Notes (Addendum)
Stage 2 pressure injuries identifeed intravaginally. Notified Dr. Tacy Learn and received verbal order to place foley. Updated patient's daughter, [name].

## 2020-07-13 NOTE — Progress Notes (Signed)
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), Afib, protein C deficiency  No Known Allergies  Patient Measurements: Height: 5\' 11"  (180.3 cm) Weight: 64 kg (141 lb 1.5 oz) IBW/kg (Calculated) : 70.8 Heparin Dosing Weight: 74.2 kg  Vital Signs: Temp: 98.7 F (37.1 C) (10/06 0800) Temp Source: Axillary (10/06 0800) BP: 111/82 (10/06 0814) Pulse Rate: 101 (10/06 0814)  Labs: Recent Labs    07/11/20 0523 07/12/20 0538 07/12/20 1810 07/13/20 0206  HGB 10.5* 11.7*  --   --   HCT 35.5* 38.2  --   --   PLT 263 336  --   --   APTT 29  --  54* 75*  LABPROT 17.2*  --   --   --   INR 1.5*  --   --   --   HEPARINUNFRC  --   --  0.32 0.50  CREATININE 1.17* 1.07*  --   --     Estimated Creatinine Clearance: 58.6 mL/min (A) (by C-G formula based on SCr of 1.07 mg/dL (H)).  Medications:  Infusions:  . sodium chloride Stopped (07/13/20 0007)  . dextrose Stopped (07/12/20 0103)  . feeding supplement (VITAL 1.5 CAL) 1,000 mL (07/12/20 1820)  . heparin 1,200 Units/hr (07/13/20 0900)    Assessment: 74 yof on Apixaban  for recent hx DVT/PE in June 2021, also with hx Afib and protein S deficiency. Apixban was held after the 10/1 PM dose for planned trach 10/4. Pharmacy consulted to start a Heparin drip post-procedure while monitoring for bleeding with new trach. If stable - can transition back to Apixaban.   Heparin initiation was held on 10/4 due to bleeding around the trach site but resumed on 10/5 per discussion with CCM.  aPTT this morning is slightly SUBtherapeutic (aPTT 64, goal of 66-102). Heparin level remains falsely elevated due to recent Apixaban use. No bleeding noted around trach site.   Goal of Therapy:  Heparin level 0.3-0.7 units/ml aPTT 66-102 seconds Monitor platelets by anticoagulation protocol: Yes   Plan:  - Increase Heparin to 1300 units/hr (13 ml/hr) - Will wait to recheck HL in the AM  for correlation - Will continue to monitor for any signs/symptoms of bleeding and will follow up with aPTT level in 6 hours   Thank you for allowing pharmacy to be a part of this patient's care.  Alycia Rossetti, PharmD, BCPS Clinical Pharmacist Clinical phone for 07/13/2020: [phone] 07/13/2020 10:10 AM   **Pharmacist phone directory can now be found on [email or web address] ([email or web address]).  Listed under [place].

## 2020-07-13 NOTE — Progress Notes (Signed)
NAME:  Carolyn Proctor, MRN:  [id number], DOB:  Dec 05, 1960, LOS: 21 ADMISSION DATE:  06/22/2020, CONSULTATION DATE:  9/15 REFERRING MD:  Tyrone Nine, CHIEF COMPLAINT:  Confusion, refractory hypoglycemia   Brief History   32 yoF at home who complained to family she felt like she was having a stroke, then developed left facial droop and went unresponsive.  Required BVM by EMS and noted to be hypoglycemic and hypotensive.  Intubated in ER for airway protection.  CTH negative.  Refractory hypoglycemia despite multiple D50 amps and required a drip.  PCCM called to admit.   Past Medical History  PAF, DVT, PE on Eliquis HFpEF CKD stage III RA Hypercoagulable state (increased protein S antigen, decreased protein C activity, and decreased Antithrombin III) Centrilobular emphysema and pulmonary fibrosis- non smoker Chronic venous insufficiency w/ chronic foot ulcers  Diverticulitis   Significant Hospital Events   9/15 admitted/ intubated >> pulse dose steroids, diuresis, cardiogenic shock support 9/25 on full vent support, encephalopathy precluding extubation 9/26 not weaning, encephalopathic, dysautonomia, off milrinonoe 10/4 Trach    Consults:  Neurology Advanced heart failure  Procedures:  9/15 ETT >> 9/15 R IJ TL CVC >>9/27 9/27 PICC >  10/4 Trach  Significant Diagnostic Tests:   9/15 CTH >> negative  9/15 CTA chest/ abd/ pelvis >> significant dilatation of RA and RV consistent with R heart failure; moderate R and small L pleural effusions with associated atelectasis of both lower lobes; underlying chronic cystic and fibrotic lung disease; severe anasarca; nodular contour of liver suggestive of possible cirrhosis  9/15 Echo> LVEF 35-40%, mod decreased LV function, RVSP 75, severely reduced RV systolic function; R to left shunt noted, suggestive of PFO, RA severely dilated, mod pericardial effusion  9/15 CTA head and neck >> no significant carotid or vertebral artery stenosis in neck, no  LVO, right jugular central venous catheter with probable surrounding hematoma   9/18 MR BRAIN>> no acute intracranial abnormality  9/28 MRI brain > diffuse pachymeningeal enhancement, bilateral cerebral convexities, sub centimeter extra axial focus of T1 hyperintensity along R temporal occiptal lobe, new  Micro Data:  9/15 BCx2 central line >> NG5D 9/15 BCx2 PIV >> Strept mitis 9/15 SARS2 >> negative 9/15 UC >> ESBL E coli 9/16 Trach asp cult >> Normal flora 9/27 blood > negative 9/27 resp > e coli ESBL  Antimicrobials:  9/15 vanc >> 9/17 9/15 zosyn >> 9/20  9/27 vanc > 9/28 9/27 cefepime > 9/29 9/29 meropenem > 10/5  Interim history/subjective:  Patient resting comfortably in bed with niece at bedside. Patient doing well with no reported acute events overnight. Currently on Trach Collar and no ventilatory support, satting 99% on 40% FiO2. Patient denies pain although is indicates she is cold and is visibly shivering. Afebrile although she is mildly tachycardic, Other vitals have stable. Mental status slightly worsened compared to previous day; she is more unsure of her current location/date/time although she is alert and readily responsive. Morning labs pending. Plan to transfer patient to floor.   Objective   Blood pressure 111/82, pulse (!) 101, temperature 98.7 F (37.1 C), temperature source Axillary, resp. rate (!) 27, height 5\' 11"  (1.803 m), weight 64 kg, last menstrual period 02/19/2013, SpO2 96 %. CVP:  [3 mmHg-7 mmHg] 3 mmHg  FiO2 (%):  [40 %] 40 %   Intake/Output Summary (Last 24 hours) at 07/13/2020 0913 Last data filed at 07/13/2020 0700 Gross per 24 hour  Intake 2746.33 ml  Output 2000 ml  Net 746.33  ml   Filed Weights   07/05/20 0500 07/09/20 0500 07/10/20 0500  Weight: 64 kg 63.7 kg 64 kg    Examination: General: NAD, older woman lying comfortably bed Neuro: alert and oriented x2, requires cuing, patient responsive with shakes of head, attempting  vocalization HEENT: trach collar in place, anicteric sclerae, MMM Cardiovascular: Regular rate/rhythm, normal S1/S2, no m/r/g Lungs:chest rise equal bilaterally, no increased WOB, clear to ausculation bilaterally Abdomen: bowel sounds appreciated Extremities: right fingertips unchanged from previous exam Skin: newly changed dressings on bilateral lower extremities   Resolved Hospital Problem list   ESBL E. 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 Ephriam Jenkins with [facility]). 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 Atrial flutter/atrial tachycardia with runs of NSVT - new this admission. - advanced heart failure team following - continue amiodarone - restarted home lasix 20 mg   Acute metabolic encephalopathy from hypoglycemia, hypotension, hypoxia. Seizure in setting of hypoxia with respiratory failure. - RASS goal 0 to -1 - continue keppra  Pulmonary embolism in June 2021. -  Continue heparin gtt, consider bridging back to eliquis  Bilateral Pressure Ulcers - Wound Consult - BID Dressing Change w/ silvadene  Hypernatremia  (Resolved) - free water, D5W - f/u BMET  Liver cirrhosis. - f/u LFTs intermittently - continue lactulose, titrate~200 mL of stool per rectal tube  Steroid induced hyperglycemia. - SSI  Severe protein calorie malnutrition. - tube feeds  Best practice:  Diet: tube feeding DVT prophylaxis: heparin gtt GI prophylaxis: protonix Mobility: bed rest Code Status: full Disposition: ICU   Critical care time: 30 minutes   Carolyne Littles, MS4

## 2020-07-14 ENCOUNTER — Inpatient Hospital Stay (HOSPITAL_COMMUNITY): Payer: BC Managed Care – PPO

## 2020-07-14 DIAGNOSIS — I272 Pulmonary hypertension, unspecified: Secondary | ICD-10-CM | POA: Diagnosis not present

## 2020-07-14 DIAGNOSIS — G9340 Encephalopathy, unspecified: Secondary | ICD-10-CM | POA: Diagnosis not present

## 2020-07-14 DIAGNOSIS — R570 Cardiogenic shock: Secondary | ICD-10-CM | POA: Diagnosis not present

## 2020-07-14 DIAGNOSIS — I50810 Right heart failure, unspecified: Secondary | ICD-10-CM | POA: Diagnosis not present

## 2020-07-14 LAB — CBC
HCT: 35.9 % — ABNORMAL LOW (ref 36.0–46.0)
Hemoglobin: 11.2 g/dL — ABNORMAL LOW (ref 12.0–15.0)
MCH: 33.2 pg (ref 26.0–34.0)
MCHC: 31.2 g/dL (ref 30.0–36.0)
MCV: 106.5 fL — ABNORMAL HIGH (ref 80.0–100.0)
Platelets: 367 10*3/uL (ref 150–400)
RBC: 3.37 MIL/uL — ABNORMAL LOW (ref 3.87–5.11)
RDW: 19.7 % — ABNORMAL HIGH (ref 11.5–15.5)
WBC: 21.5 10*3/uL — ABNORMAL HIGH (ref 4.0–10.5)
nRBC: 0.1 % (ref 0.0–0.2)

## 2020-07-14 LAB — URINALYSIS, ROUTINE W REFLEX MICROSCOPIC
Bilirubin Urine: NEGATIVE
Glucose, UA: NEGATIVE mg/dL
Ketones, ur: NEGATIVE mg/dL
Nitrite: NEGATIVE
Protein, ur: 100 mg/dL — AB
RBC / HPF: >50 RBC/hpf — ABNORMAL HIGH (ref 0–5)
Specific Gravity, Urine: 1.019 (ref 1.005–1.030)
WBC, UA: >50 WBC/hpf — ABNORMAL HIGH (ref 0–5)
pH: 6 (ref 5.0–8.0)

## 2020-07-14 LAB — GLUCOSE, CAPILLARY
Glucose-Capillary: 113 mg/dL — ABNORMAL HIGH (ref 70–99)
Glucose-Capillary: 137 mg/dL — ABNORMAL HIGH (ref 70–99)
Glucose-Capillary: 159 mg/dL — ABNORMAL HIGH (ref 70–99)
Glucose-Capillary: 188 mg/dL — ABNORMAL HIGH (ref 70–99)
Glucose-Capillary: 81 mg/dL (ref 70–99)
Glucose-Capillary: 88 mg/dL (ref 70–99)

## 2020-07-14 LAB — HEPARIN LEVEL (UNFRACTIONATED)
Heparin Unfractionated: 0.46 IU/mL (ref 0.30–0.70)
Heparin Unfractionated: 0.55 IU/mL (ref 0.30–0.70)

## 2020-07-14 LAB — BASIC METABOLIC PANEL
Anion gap: 12 (ref 5–15)
BUN: 52 mg/dL — ABNORMAL HIGH (ref 6–20)
CO2: 21 mmol/L — ABNORMAL LOW (ref 22–32)
Calcium: 8.2 mg/dL — ABNORMAL LOW (ref 8.9–10.3)
Chloride: 109 mmol/L (ref 98–111)
Creatinine, Ser: 1.2 mg/dL — ABNORMAL HIGH (ref 0.44–1.00)
GFR calc non Af Amer: 50 mL/min — ABNORMAL LOW (ref >60)
Glucose, Bld: 120 mg/dL — ABNORMAL HIGH (ref 70–99)
Potassium: 4.7 mmol/L (ref 3.5–5.1)
Sodium: 142 mmol/L (ref 135–145)

## 2020-07-14 LAB — PHOSPHORUS: Phosphorus: 3 mg/dL (ref 2.5–4.6)

## 2020-07-14 LAB — MAGNESIUM: Magnesium: 2 mg/dL (ref 1.7–2.4)

## 2020-07-14 LAB — APTT: aPTT: 85 seconds — ABNORMAL HIGH (ref 24–36)

## 2020-07-14 MED ORDER — ACETAMINOPHEN 160 MG/5ML PO SOLN
650.0000 mg | Freq: Four times a day (QID) | ORAL | Status: DC | PRN
  Administered 2020-07-14 – 2020-07-15 (×3): 650 mg via ORAL
  Filled 2020-07-14 (×3): qty 20.3

## 2020-07-14 MED ORDER — OSMOLITE 1.5 CAL PO LIQD
1000.0000 mL | ORAL | Status: DC
  Administered 2020-07-14 – 2020-07-24 (×12): 1000 mL
  Filled 2020-07-14 (×9): qty 1000

## 2020-07-14 MED ORDER — LEVALBUTEROL HCL 0.63 MG/3ML IN NEBU
0.6300 mg | INHALATION_SOLUTION | Freq: Four times a day (QID) | RESPIRATORY_TRACT | Status: DC
  Administered 2020-07-14 (×2): 0.63 mg via RESPIRATORY_TRACT
  Filled 2020-07-14 (×2): qty 3

## 2020-07-14 MED ORDER — POTASSIUM CHLORIDE 10 MEQ/100ML IV SOLN
10.0000 meq | INTRAVENOUS | Status: AC
  Administered 2020-07-14 (×6): 10 meq via INTRAVENOUS
  Filled 2020-07-14 (×2): qty 100

## 2020-07-14 MED ORDER — PROSOURCE TF PO LIQD
45.0000 mL | Freq: Every day | ORAL | Status: DC
  Administered 2020-07-15 – 2020-07-26 (×12): 45 mL
  Filled 2020-07-14 (×12): qty 45

## 2020-07-14 MED ORDER — LEVALBUTEROL HCL 0.63 MG/3ML IN NEBU
0.6300 mg | INHALATION_SOLUTION | Freq: Three times a day (TID) | RESPIRATORY_TRACT | Status: DC
  Administered 2020-07-15 – 2020-07-17 (×6): 0.63 mg via RESPIRATORY_TRACT
  Filled 2020-07-14 (×6): qty 3

## 2020-07-14 MED ORDER — ACETAMINOPHEN 160 MG/5ML PO SOLN: 650.0000 mg | Freq: Four times a day (QID) | ORAL | Status: DC | PRN

## 2020-07-14 MED ORDER — IPRATROPIUM BROMIDE 0.02 % IN SOLN
0.5000 mg | Freq: Three times a day (TID) | RESPIRATORY_TRACT | Status: DC
  Administered 2020-07-15 – 2020-07-17 (×6): 0.5 mg via RESPIRATORY_TRACT
  Filled 2020-07-14 (×6): qty 2.5

## 2020-07-14 MED ORDER — FREE WATER
150.0000 mL | Status: DC
  Administered 2020-07-14 – 2020-07-18 (×24): 150 mL

## 2020-07-14 MED ORDER — IPRATROPIUM BROMIDE 0.02 % IN SOLN
0.5000 mg | Freq: Four times a day (QID) | RESPIRATORY_TRACT | Status: DC
  Administered 2020-07-14 (×2): 0.5 mg via RESPIRATORY_TRACT
  Filled 2020-07-14 (×2): qty 2.5

## 2020-07-14 MED ORDER — INSULIN ASPART 100 UNIT/ML ~~LOC~~ SOLN
3.0000 [IU] | SUBCUTANEOUS | Status: DC
  Administered 2020-07-14 – 2020-07-21 (×30): 3 [IU] via SUBCUTANEOUS

## 2020-07-14 MED ORDER — SODIUM CHLORIDE 0.9 % IV SOLN
2.0000 g | Freq: Three times a day (TID) | INTRAVENOUS | Status: DC
  Administered 2020-07-14 – 2020-07-22 (×23): 2 g via INTRAVENOUS
  Filled 2020-07-14 (×31): qty 2

## 2020-07-14 NOTE — Progress Notes (Signed)
Advanced Heart Failure Rounding Note   Subjective:    Moved out of ICU. On 3W.   Remains on vent through trach. Much more lethargic today but will follow commands.  No co-ox. Getting lasix 20 IV daily. Remains febrile with Tmax 102.   Objective:   Weight Range:  Vital Signs:   Temp:  [98.5 F (36.9 C)-102 F (38.9 C)] 102 F (38.9 C) (10/07 1223) Pulse Rate:  [104-123] 106 (10/07 1222) Resp:  [16-30] 28 (10/07 1222) BP: (110-143)/(59-82) 121/68 (10/07 1222) SpO2:  [94 %-100 %] 97 % (10/07 1222) FiO2 (%):  [35 %-40 %] 35 % (10/07 1222) Last BM Date: 07/13/20  Weight change: Filed Weights   07/05/20 0500 07/09/20 0500 07/10/20 0500  Weight: 64 kg 63.7 kg 64 kg    Intake/Output:   Intake/Output Summary (Last 24 hours) at 07/14/2020 1302 Last data filed at 07/14/2020 0600 Gross per 24 hour  Intake 1099.85 ml  Output 2040 ml  Net -940.15 ml     Physical Exam: General:  Chronically ill/ cachetic appearing female. On vent through trach. Awake but lethargic able to follow comamnds HEENT: + temporal wasting Neck: supple. + trach  Cor: PMI nondisplaced. Regular rate & rhythm. 2/6 TR Lungs: diminished throughout  Abdomen: soft, nontender, nondistended. No hepatosplenomegaly. No bruits or masses. Good bowel sounds. Extremities: no cyanosis, clubbing, rash, tr-1+ edema ulcer on right shin Neuro: as above  Telemetry: NSR 90-110 Personally reviewed     Labs: Basic Metabolic Panel: Recent Labs  Lab 07/10/20 0518 07/10/20 0518 07/11/20 0523 07/11/20 0523 07/12/20 0538 07/13/20 1055 07/14/20 0514  NA 150*  --  144  --  140 140 142  K 4.0  --  5.1  --  5.0 4.9 4.7  CL 114*  --  112*  --  111 110 109  CO2 25  --  29  --  19* 20* 21*  GLUCOSE 129*  --  105*  --  98 119* 120*  BUN 64*  --  64*  --  56* 52* 52*  CREATININE 0.95  --  1.17*  --  1.07* 1.13* 1.20*  CALCIUM 7.0*   < > 8.2*   < > 8.0* 8.1* 8.2*  MG  --   --   --   --  2.2  --  2.0  PHOS  --   --    --   --  3.7  --  3.0   < > = values in this interval not displayed.    Liver Function Tests: Recent Labs  Lab 07/08/20 0500  AST 43*  ALT 53*  ALKPHOS 128*  BILITOT 0.9  PROT 5.2*  ALBUMIN 1.4*   No results for input(s): LIPASE, AMYLASE in the last 168 hours. No results for input(s): AMMONIA in the last 168 hours.  CBC: Recent Labs  Lab 07/08/20 0500 07/08/20 0500 07/10/20 0518 07/11/20 0523 07/12/20 0538 07/13/20 1055 07/14/20 0514  WBC 25.2*   < > 20.9* 17.9* 17.6* 22.7* 21.5*  NEUTROABS 23.5*  --   --   --   --   --   --   HGB 11.6*   < > 9.5* 10.5* 11.7* 12.2 11.2*  HCT 38.2   < > 32.7* 35.5* 38.2 38.6 35.9*  MCV 107.3*   < > 110.8* 109.2* 106.7* 104.9* 106.5*  PLT 294   < > 235 263 336 391 367   < > = values in this interval not displayed.  Cardiac Enzymes: No results for input(s): CKTOTAL, CKMB, CKMBINDEX, TROPONINI in the last 168 hours.  BNP: BNP (last 3 results) No results for input(s): BNP in the last 8760 hours.  ProBNP (last 3 results) No results for input(s): PROBNP in the last 8760 hours.    Other results:  Imaging: DG CHEST PORT 1 VIEW  Result Date: 07/14/2020 CLINICAL DATA:  Shortness of breath. EXAM: PORTABLE CHEST 1 VIEW COMPARISON:  Chest x-ray dated July 12, 2020. FINDINGS: Unchanged tracheostomy tube, feeding tube, and right upper extremity PICC line. Stable cardiomegaly. Unchanged diffuse interstitial coarsening. No focal consolidation, pleural effusion, or pneumothorax. No acute osseous abnormality. IMPRESSION: 1. Unchanged chronic interstitial lung disease. Electronically Signed   By: Titus Dubin M.D.   On: 07/14/2020 11:42     Medications:     Scheduled Medications: . amiodarone  200 mg Per Tube Daily  . chlorhexidine gluconate (MEDLINE KIT)  15 mL Mouth Rinse BID  . Chlorhexidine Gluconate Cloth  6 each Topical Q0600  . [START ON 07/15/2020] feeding supplement (PROSource TF)  45 mL Per Tube Daily  . free water  150 mL  Per Tube Q4H  . furosemide  20 mg Intravenous Daily  . influenza vac split quadrivalent PF  0.5 mL Intramuscular Tomorrow-1000  . insulin aspart  0-15 Units Subcutaneous Q4H  . insulin aspart  3 Units Subcutaneous Q4H  . ipratropium  0.5 mg Nebulization Q6H  . lactulose  30 g Per Tube Daily  . levalbuterol  0.63 mg Nebulization Q6H  . levETIRAcetam  500 mg Per Tube BID  . mouth rinse  15 mL Mouth Rinse 10 times per day  . pantoprazole sodium  40 mg Per Tube Daily  . predniSONE  40 mg Per Tube Q breakfast  . silver sulfADIAZINE   Topical BID  . sodium chloride flush  10-40 mL Intracatheter Q12H    Infusions: . sodium chloride Stopped (07/13/20 0007)  . dextrose Stopped (07/12/20 0103)  . feeding supplement (OSMOLITE 1.5 CAL) 1,000 mL (07/14/20 1237)  . heparin 1,300 Units/hr (07/14/20 0527)  . potassium chloride 10 mEq (07/14/20 1151)    PRN Medications: Place/Maintain arterial line **AND** sodium chloride, acetaminophen (TYLENOL) oral liquid 160 mg/5 mL, melatonin, midazolam, polyethylene glycol, sodium chloride flush, white petrolatum    Plan/Discussion:    1. Shock  - initially cardiogenic shock due to severe RV failure/cor pulmonale  - Echo 06/22/20 Echo LV small compressed from RV EF 45-50% RV massively dilated and severely hypokinetic. Severe TR RVSP 76 Severe D-sign. Moderate pericardial effusion  - improved with milrinone and diuresis. - On 9/27 developed component of septic shock. BCx NGTD. E coli (ESBL) in sputum - ESBL resistant to cefepime =>Switched to mero, complete day 7 on 10/5 - Remains febrile concern for ongoing infection vs ongoing CTD flare.  - TRH managing. Would keep on prednisone 40 daily for now and not wean.   2. [place] RV/failure - RH cath in 12/20 with mild PAH with normal RV function. - PFTs much better than I expected based on CT chest. DLCO markedly reduced - Echo 06/22/20 Echo LV small compressed from RV EF 45-50% RV massively dilated and severely  hypokinetic. Severe TR RVSP 76 Severe D-sign. Moderate pericardial effusion  - Milrinone stopped 9/26. Co-ox has been stable  - Volume status looks ok - Ideally need RHC but currently not a candidate. We will continue to follow peripherally to assess timing  3. Severe encephalopathy - etiology unclear - repeat head CT  unrevealing.  - brain MRI on 9/18 with no evidence vasculitis (has received steroids). Repeat brain MRI with findings as above. (unclear significance)  - improved with increasing steroids  4. Acute hypoxic respiratory failure - multifactorial including PAH, ILD, large R effusion, pulmonary edema and possible aspiration  - Tracheal aspirate now with ESBL E.coli. Continue abx therapy w/ meropenum - s/p trach 10/4  5. 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 Down to 29 on 9/26.  - Remains on lactulose . TRH managing   6. CTD with h/o RA and SLE - Case d/w with Dr. [place] Bells. She has had rapid progression of RV failure along with mental status changes and lung infiltrates in setting of known CTD. Several markers elevated- - Agree that this is mostly likely CTD flare.  - Steroids have been increased. Would continue for extended course - Dr. Sherrin Daisy has discussed w/ her outpatient Rheumatologist. He feels that she has a vasculitis picture. - Given her underlying infections (currently ESBL) we can't use more aggressive immunosuppression other than prednisone at this time. Would be careful with steroid taper. Continue prednisone 40 daily for now   7. DVT/PE - diagnosed 6/21 at [facility] - CT this admit negative for acute PE - Back on heparin. Ok to switch back to Eliquis from my standpoint   8. AFL/atrial tach with runs NSVT  - in NSR - Keep amio at 200 daily  9. Hypernatremia - sodium 142 - off free water   Length of Stay: [place], MD 07/14/2020, 1:02 PM  Advanced Heart  Failure Team Pager [phone] (M-F; 7a - 4p)  Please contact [facility] for night-coverage after hours (4p -7a ) and weekends on [email or web address]

## 2020-07-14 NOTE — Progress Notes (Signed)
Nutrition Follow-up  DOCUMENTATION CODES:   Severe malnutrition in context of chronic illness  INTERVENTION:  Continue TF via Cortrak: -Transition to Osmolite 1.5 cal @ 5m/hr (14431m -4551mrosource TF daily -150m26mee water Q4H  Provides 2200 kcals, 101 grams protein, 1097ml29me water (1997ml 69ml free water with flushes)   NUTRITION DIAGNOSIS:   Severe Malnutrition related to chronic illness as evidenced by severe muscle depletion, energy intake < or equal to 75% for > or equal to 1 month, moderate fat depletion, edema.  Ongoing  GOAL:   Patient will meet greater than or equal to 90% of their needs  Met with TF  MONITOR:   TF tolerance, I & O's  REASON FOR ASSESSMENT:   Consult, Ventilator Enteral/tube feeding initiation and management  ASSESSMENT:   Pt with PMH of PAF, DVT and PE dx 03/2019 on Eliquis, RA, HF, centrilobular emphysema, pulmonary fibrosis on daily prednisone, and chronic venous insufficiency with chronic foot ulcers.Admitted with cardiogenic shock due to severe RV failure/cor pulmonale CTD flare.  09/15 Admitted  10/01 Cortrak placed (gastric) 10/04 Trach placed  Pt tolerating trach collar for over 24 hours with no ventilator support and no more bleeding from trach site.   Current TF via Cortrak: Vital 1.5 to 55 ml/hr with Pro-Source 45 mL TID and 200ml f104mwater Q4H. Provides 122 g of protein, 2100 kcals, 2203 mL of free water. Meets 100% estimated calorie and protein needs. Will transition pt to Osmolite now that pt is out of the ICU.  Admit wt: 65.3 kg Current wt: 64 kg  UOP: 1640ml x265murs I/O: -5015.8ml sinc70mdmit 400ml stoo51ma rectal tube x24 hours 1x unmeasured stool occurrence   Labs: CBGs 88-137 Medications: lasix, novolog, chronulac, protonix, deltasone  Diet Order:   Diet Order            Diet NPO time specified  Diet effective midnight                 EDUCATION NEEDS:   No education needs have been  identified at this time  Skin:  Skin Assessment: Skin Integrity Issues: Skin Integrity Issues:: Other (Comment), Stage II Stage II: labia Other: MASD R/L buttocks  Last BM:  10/6  400ml recta102mbe  Height:   Ht Readings from Last 1 Encounters:  06/22/20 '5\' 11"'  (1.803 m)    Weight:   Wt Readings from Last 1 Encounters:  07/10/20 64 kg    Ideal Body Weight:  70.4 kg  BMI:  Body mass index is 19.68 kg/m.  Estimated Nutritional Needs:   Kcal:  1920-2240 [id number]:  100-130 grams  Fluid:  >/= 1.8 L   Mickie Kozikowski AverLarkin InaDN RD pager number and weekend/on-call pager number located in [email or web address]

## 2020-07-14 NOTE — Progress Notes (Signed)
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), Afib, protein C deficiency  No Known Allergies  Patient Measurements: Height: 5\' 11"  (180.3 cm) Weight: 64 kg (141 lb 1.5 oz) IBW/kg (Calculated) : 70.8 Heparin Dosing Weight: 74.2 kg  Vital Signs: Temp: 101.5 F (38.6 C) (10/07 1448) Temp Source: Axillary (10/07 1448) BP: 121/68 (10/07 1222) Pulse Rate: 106 (10/07 1222)  Labs: Recent Labs    07/12/20 0538 07/12/20 0538 07/12/20 1810 07/13/20 0206 07/13/20 1055 07/13/20 1943 07/14/20 0514 07/14/20 1406  HGB 11.7*   < >  --   --  12.2  --  11.2*  --   HCT 38.2  --   --   --  38.6  --  35.9*  --   PLT 336  --   --   --  391  --  367  --   APTT  --    < >   < > 75* 64* 37*  --  85*  HEPARINUNFRC  --   --    < > 0.50 0.41  --   --  0.46  CREATININE 1.07*  --   --   --  1.13*  --  1.20*  --    < > = values in this interval not displayed.    Estimated Creatinine Clearance: 52.3 mL/min (A) (by C-G formula based on SCr of 1.2 mg/dL (H)).  Medications:  Infusions:  . sodium chloride Stopped (07/13/20 0007)  . dextrose Stopped (07/12/20 0103)  . feeding supplement (OSMOLITE 1.5 CAL) 1,000 mL (07/14/20 1237)  . heparin 1,300 Units/hr (07/14/20 0527)  . meropenem (MERREM) IV 2 g (07/14/20 1456)    Assessment: 54 yof on Apixaban  for recent hx DVT/PE in June 2021, also with hx Afib and protein S deficiency. Apixban was held after the 10/1 PM dose for planned trach 10/4. Pharmacy consulted to start a Heparin drip post-procedure while monitoring for bleeding with new trach. If stable - can transition back to Apixaban.   Heparin initiation was held on 10/4 due to bleeding around the trach site but resumed on 10/7.  Heparin drip restarted at ~0530 this AM. APTT after restart was 85 sec with HL at 0.46 which correlates. Both values are in desired range.   Goal of Therapy:  Heparin level 0.3-0.7 units/ml aPTT  66-102 seconds Monitor platelets by anticoagulation protocol: Yes   Plan:  Continue heparin drip at 1300 units/hr Confirmatory heparin level in 6 hours Daily Heparin level in AM     Abbi Mancini A. Levada Dy, PharmD, BCPS, FNKF Clinical Pharmacist [place] Please utilize Amion for appropriate phone number to reach the unit pharmacist ([email or web address])

## 2020-07-14 NOTE — Progress Notes (Signed)
PROGRESS NOTE    Carolyn Proctor  GYJ:[id number] DOB: [birth year] DOA: 06/22/2020 PCP: Lilian Coma., MD   Brief Narrative:  Patient is a 59 year old chronically ill-appearing African-American female with a past medical history significant for proximal atrial fibrillation, history of DVT, history of PE anticoagulated Eliquis, history of heart failure with preserved ejection fraction, history of CKD stage III, history of rheumatoid arthritis, hypercoagulable state including increased protein S antigen, decreased protein C activity and decreased Antithrombin III, history of centrilobular emphysema and pulmonary fibrosis, history of chronic venous insufficiency with chronic foot ulcers, history of diverticulitis, history of mixed connective tissue disease and other comorbidities who felt like she was having a stroke and then developed a left facial droop and went unresponsive.  She subsequently required bag valve mask by EMS and was noted to be hypoglycemic and hypotensive.  She was intubated in the ED for airway protection and head CT was done which was negative.  And because she had refractory hypoglycemia despite multiple D 50 a.m. she was requiring a drip.  PCCM was called to admit for this patient.  She is admitted on 06/22/2020 and initiated on pulse dose steroids and diuresis given that she was in cardiogenic shock.  On 07/02/2020 she was on full ventilatory support and was encephalopathic precluding extubation.  926 she is but not weaning and encephalopathic with dysautonomia and off amiodarone drip and subsequently tracheostomy was placed on 07/11/2020.  Further work-up revealed that she had acute hypoxic respiratory failure with inability to protect her airway in the setting of hypoglycemia and subsequently developing HCAP with an ESBL E. coli and she has interstitial lung disease with connective tissue disease with acute pneumonitis and pulmonary edema.  She was also found to be subsequently an acute  systolic CHF and advanced heart failure team was consulted.  During her hospitalization she also developed atrial flutter with atrial tachycardia runs of nonsustained ventricular tachycardia.  Subsequently she has been placed on many antibiotics and escalated to meropenem given her ESBL.  She has now been on trach collar and she is transferred to [facility] service on 07/14/2020 after a prolonged ICU stay.  Today she spiked a temperature of 102 and became tachycardic and tachypneic.  We will panculture her again and resume antibiotics with IV meropenem.  Pulmonary still will be following for trach care.   Assessment & Plan:   Active Problems:   Pulmonary HTN (HCC)   Acute encephalopathy   Acute respiratory failure (HCC)   Oxygen desaturation   Cardiogenic shock (HCC)   Acute renal failure with tubular necrosis (HCC)   RVF (right ventricular failure) (HCC)   E. 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 - patient doing well on minimal ventilation settings, trach collar trial - completed meropenem abx treatment because she spiked another temperature will resume for now and likely prolonged treatment - continue prednisone 40 mg daily for acute pneumonitis in setting of ILD, consider slow steroid taper - Follow CXR -Mains on trach collar now -SpO2: 95 % O2 Flow Rate (L/min): 8 L/min FiO2 (%): 35 % -Repeat chest x-ray in the a.m. and repeat chest x-ray today showed "Unchanged tracheostomy tube, feeding tube, and right upper extremity PICC line. Stable cardiomegaly. Unchanged diffuse interstitial coarsening.  No focal consolidation, pleural effusion, or pneumothorax. No acute osseous abnormality." -Dr. Lake Bells discussed with her  outpatient rheumatologist and they feel that this is a vasculitis picture and given her underlying infections and they cannot use more aggressive immunosuppression other than prednisone at this time -Continue with breathing treatment and continue with Xopenex Atrovent  Fever and SIRS -Temperature of 102 today and was tachycardic and tachypneic -Currently no clear source of infection and she had just completed treatment for HCAP with ESBL E Coli -likely this is in the setting of this again  -panculture and repeat blood cultures x2, urinalysis, urine culture and chest x-ray -Resumed IV meropenem  -follow cultures and data monitor temperature curve and WBC now-WBC remains elevated and went from 17.6 and went to 22.7 yesterday and today is 21.5  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 Ephriam Jenkins with [facility]). - previously treated with rituximab >> last in November 2020 - defer further immunosuppressive therapy while she is being treated for infection -She will need close rheumatology follow-up at discharge.   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 Atrial flutter/atrial tachycardia with runs of NSVT - new this admission. - advanced heart failure team following; initially she was in cardiogenic shock due to right ventricular failure/cor pulmonale and echo was repeated and she is improved with milrinone and diuresis -Sequently developed septic shock with ESBL E. coli and was changed from cefepime to meropenem - continue amiodarone 200 mg daily -Cardiology recommending that she needs a right heart catheter currently not a candidate and they are not to pursue this on Friday -Continues to get IV Lasix 20 mg daily  Acute metabolic encephalopathy from hypoglycemia, hypotension, hypoxia. Seizure in setting of hypoxia with respiratory failure. -continue  keppra -Delirium precautions -Brain MRI with no evidence of vasculitis  -continue with steroids  Pulmonary embolism in June 2021. -  Continue heparin gtt, consider bridging back to eliquis as she is diagnosed back in June of this year  Bilateral Pressure Ulcers - Wound Consult - BID Dressing Change w/ silvadene  Hypernatremia (Resolved) - free water not been discontinued, D5W at 25 mL's per hour -Sodium is now 142 -Continue monitor carefully and repeat CMP in the a.m.  Liver cirrhosis Abnormal LFTs, mildly elevated on last check - f/u LFTs intermittently and will repeat CMP in the a.m. -continue lactulose, titrate~200 mL of stool per rectal tube  Steroid induced hyperglycemia. - SSI with moderate NovoLog sliding scale insulin every 4 hours as well as 3 units subcu every 4 hours scheduled  Severe protein calorie malnutrition. - tube feeds with Osmolite 1.5 at 60 mils per hour, 45 mill liters of Prosource tube feeding daily, 150 mils of free water every 4  DVT prophylaxis: Anticoagulated with a heparin drip Code Status: FULL CODE  Family Communication: Discussed with Aunt at bedside  Disposition Plan: Pending further improvement of her clinical status and resolution of her fevers.  Will need clearance by pulmonary as well as cardiology and may require an inpatient right heart cath if she improves  Status is: Inpatient  Remains inpatient appropriate because:Ongoing diagnostic testing needed not appropriate for outpatient work up, Unsafe d/c plan, IV treatments appropriate due to intensity of illness or inability to take PO and Inpatient level of care appropriate due to severity of illness   Dispo: The patient is from: Home  Anticipated d/c is to: LTAC              Anticipated d/c date is: > 3 days              Patient currently is not medically stable to d/c.  Consultants:   PCCM Transfer  Cardiology  Neurology  Procedures:  9/15 ETT >> 9/15 R IJ  TL CVC >>9/27 9/27 PICC >  10/4 Trach  Significant Diagnostic Tests:   9/15 CTH >> negative  9/15 CTA chest/ abd/ pelvis >> significant dilatation of RA and RV consistent with R heart failure; moderate R and small L pleural effusions with associated atelectasis of both lower lobes; underlying chronic cystic and fibrotic lung disease; severe anasarca; nodular contour of liver suggestive of possible cirrhosis  9/15 Echo> LVEF 35-40%, mod decreased LV function, RVSP 75, severely reduced RV systolic function; R to left shunt noted, suggestive of PFO, RA severely dilated, mod pericardial effusion  9/15 CTA head and neck >> no significant carotid or vertebral artery stenosis in neck, no LVO, right jugular central venous catheter with probable surrounding hematoma  9/18 MR BRAIN>> no acute intracranial abnormality  9/28 MRI brain > diffuse pachymeningeal enhancement, bilateral cerebral convexities, sub centimeter extra axial focus of T1 hyperintensity along R temporal occiptal lobe, new  Micro Data:  9/15 BCx2 central line >> NG5D 9/15 BCx2 PIV >> Strept mitis 9/15 SARS2 >> negative 9/15 UC >> ESBL E coli 9/16 Trach asp cult >> Normal flora 9/27 blood > negative 9/27 resp > e coli ESBL  Antimicrobials:  9/15 vanc >> 9/17 9/15 zosyn >> 9/20  9/27 vanc > 9/28 9/27 cefepime > 9/29 9/29 meropenem > 10/5     Antimicrobials:  Anti-infectives (From admission, onward)   Start     Dose/Rate Route Frequency Ordered Stop   07/14/20 1400  meropenem (MERREM) 2 g in sodium chloride 0.9 % 100 mL IVPB        2 g 200 mL/hr over 30 Minutes Intravenous Every 8 hours 07/14/20 1339     07/06/20 1400  meropenem (MERREM) 2 g in sodium chloride 0.9 % 100 mL IVPB        2 g 200 mL/hr over 30 Minutes Intravenous Every 8 hours 07/06/20 1124 07/12/20 2230   07/05/20 1000  vancomycin (VANCOREADY) IVPB 500 mg/100 mL  Status:  Discontinued        500 mg 100 mL/hr over 60 Minutes Intravenous Every 12  hours 07/04/20 2127 07/05/20 0902   07/04/20 2200  ceFEPIme (MAXIPIME) 2 g in sodium chloride 0.9 % 100 mL IVPB  Status:  Discontinued        2 g 200 mL/hr over 30 Minutes Intravenous Every 12 hours 07/04/20 0929 07/06/20 1124   07/04/20 2200  vancomycin (VANCOREADY) IVPB 500 mg/100 mL  Status:  Discontinued        500 mg 100 mL/hr over 60 Minutes Intravenous Every 12 hours 07/04/20 1149 07/04/20 2127   07/04/20 2200  vancomycin (VANCOCIN) IVPB 1000 mg/200 mL premix        1,000 mg 200 mL/hr over 60 Minutes Intravenous  Once 07/04/20 2127 07/05/20 0032   07/04/20 0930  vancomycin (VANCOCIN) IVPB 1000 mg/200 mL premix  Status:  Discontinued        1,000 mg 200 mL/hr over 60 Minutes Intravenous  Once 07/04/20 0929 07/04/20 2127   07/04/20 0930  vancomycin (VANCOREADY) IVPB 500 mg/100 mL  Status:  Discontinued  500 mg 100 mL/hr over 60 Minutes Intravenous Every 12 hours 07/04/20 0929 07/04/20 1149   06/23/20 1015  vancomycin (VANCOREADY) IVPB 750 mg/150 mL  Status:  Discontinued        750 mg 150 mL/hr over 60 Minutes Intravenous Every 12 hours 06/23/20 1007 06/24/20 0849   06/23/20 1000  piperacillin-tazobactam (ZOSYN) IVPB 3.375 g        3.375 g 12.5 mL/hr over 240 Minutes Intravenous Every 8 hours 06/23/20 0948 06/28/20 0805   06/22/20 1230  vancomycin (VANCOREADY) IVPB 1250 mg/250 mL        1,250 mg 166.7 mL/hr over 90 Minutes Intravenous  Once 06/22/20 1226 06/22/20 1535   06/22/20 1230  piperacillin-tazobactam (ZOSYN) IVPB 3.375 g        3.375 g 100 mL/hr over 30 Minutes Intravenous  Once 06/22/20 1226 06/22/20 1502       Subjective: Seen and examined at bedside and she was a little response of the day and did not complain of any pain.  Did not really interact and aunt at bedside feels that she is more pale today not as interactive as she was yesterday.  Patient's aunt feels that she is paler today.  No complaints noted and her respiratory status waxes and wanes.  She did  spike a temperature later today causing elevation in her heart rate and blood pressure.  Objective: Vitals:   07/14/20 1223 07/14/20 1342 07/14/20 1448 07/14/20 1524  BP:    114/62  Pulse:    (!) 103  Resp:    (!) 30  Temp: (!) 102 F (38.9 C) (!) 101.8 F (38.8 C) (!) 101.5 F (38.6 C)   TempSrc: Axillary Axillary Axillary   SpO2:    95%  Weight:      Height:        Intake/Output Summary (Last 24 hours) at 07/14/2020 1704 Last data filed at 07/14/2020 0600 Gross per 24 hour  Intake 827.81 ml  Output 840 ml  Net -12.19 ml   Filed Weights   07/05/20 0500 07/09/20 0500 07/10/20 0500  Weight: 64 kg 63.7 kg 64 kg   Examination: Physical Exam:  Constitutional: Thin chronically ill-appearing African-American female currently in NAD and appears calm but does appear uncomfortable Eyes: Lids and conjunctivae normal, sclerae anicteric  ENMT: External Ears, Nose appear normal. Grossly normal hearing.  Core track in place Neck: Tracheostomy and is wearing ATC Respiratory: Diminished to auscultation bilaterally with coarse breath sounds and some rhonchi and some slight crackles.  She is tachypneic and minimally using accessory muscles Cardiovascular: Tachycardic rate but regular rhythm, no murmurs / rubs / gallops.  Has 1+ lower extremity edema  Abdomen: Soft, non-tender, non-distended. Bowel sounds positive.  GU: Deferred.  Foley catheter in place and a rectal Flexi-Seal Musculoskeletal: No clubbing / cyanosis of digits/nails. No joint deformity upper and lower extremities.  Skin: Has bilateral pressure ulcers. No induration; Warm and dry.  Neurologic: CN 2-12 grossly intact with no focal deficits Psychiatric: Impaired judgment and insight.  She is awake but not fully oriented. Normal mood and appropriate affect.   Data Reviewed: I have personally reviewed following labs and imaging studies  CBC: Recent Labs  Lab 07/08/20 0500 07/08/20 0500 07/10/20 0518 07/11/20 0523  07/12/20 0538 07/13/20 1055 07/14/20 0514  WBC 25.2*   < > 20.9* 17.9* 17.6* 22.7* 21.5*  NEUTROABS 23.5*  --   --   --   --   --   --   HGB 11.6*   < >  9.5* 10.5* 11.7* 12.2 11.2*  HCT 38.2   < > 32.7* 35.5* 38.2 38.6 35.9*  MCV 107.3*   < > 110.8* 109.2* 106.7* 104.9* 106.5*  PLT 294   < > 235 263 336 391 367   < > = values in this interval not displayed.   Basic Metabolic Panel: Recent Labs  Lab 07/10/20 0518 07/11/20 0523 07/12/20 0538 07/13/20 1055 07/14/20 0514  NA 150* 144 140 140 142  K 4.0 5.1 5.0 4.9 4.7  CL 114* 112* 111 110 109  CO2 25 29 19* 20* 21*  GLUCOSE 129* 105* 98 119* 120*  BUN 64* 64* 56* 52* 52*  CREATININE 0.95 1.17* 1.07* 1.13* 1.20*  CALCIUM 7.0* 8.2* 8.0* 8.1* 8.2*  MG  --   --  2.2  --  2.0  PHOS  --   --  3.7  --  3.0   GFR: Estimated Creatinine Clearance: 52.3 mL/min (A) (by C-G formula based on SCr of 1.2 mg/dL (H)). Liver Function Tests: Recent Labs  Lab 07/08/20 0500  AST 43*  ALT 53*  ALKPHOS 128*  BILITOT 0.9  PROT 5.2*  ALBUMIN 1.4*   No results for input(s): LIPASE, AMYLASE in the last 168 hours. No results for input(s): AMMONIA in the last 168 hours. Coagulation Profile: Recent Labs  Lab 07/11/20 0523  INR 1.5*   Cardiac Enzymes: No results for input(s): CKTOTAL, CKMB, CKMBINDEX, TROPONINI in the last 168 hours. BNP (last 3 results) No results for input(s): PROBNP in the last 8760 hours. HbA1C: No results for input(s): HGBA1C in the last 72 hours. CBG: Recent Labs  Lab 07/13/20 2019 07/13/20 2357 07/14/20 0352 07/14/20 0815 07/14/20 1208  GLUCAP 147* 104* 137* 88 188*   Lipid Profile: No results for input(s): CHOL, HDL, LDLCALC, TRIG, CHOLHDL, LDLDIRECT in the last 72 hours. Thyroid Function Tests: No results for input(s): TSH, T4TOTAL, FREET4, T3FREE, THYROIDAB in the last 72 hours. Anemia Panel: No results for input(s): VITAMINB12, FOLATE, FERRITIN, TIBC, IRON, RETICCTPCT in the last 72 hours. Sepsis  Labs: No results for input(s): PROCALCITON, LATICACIDVEN in the last 168 hours.  Recent Results (from the past 240 hour(s))  Culture, blood (Routine X 2) w Reflex to ID Panel     Status: None   Collection Time: 07/04/20  8:31 PM   Specimen: BLOOD  Result Value Ref Range Status   Specimen Description BLOOD RIGHT ARM  Final   Special Requests   Final    BOTTLES DRAWN AEROBIC ONLY Blood Culture results may not be optimal due to an inadequate volume of blood received in culture bottles   Culture   Final    NO GROWTH 5 DAYS Performed at [facility], [place] [address], [place], [place] [address]    Report Status 07/09/2020 FINAL  Final  Culture, blood (Routine X 2) w Reflex to ID Panel     Status: None   Collection Time: 07/04/20  8:31 PM   Specimen: BLOOD  Result Value Ref Range Status   Specimen Description BLOOD PICC LINE  Final   Special Requests   Final    BOTTLES DRAWN AEROBIC AND ANAEROBIC Blood Culture adequate volume   Culture   Final    NO GROWTH 5 DAYS Performed at [facility], [place] [address] [place], [place] [address]    Report Status 07/09/2020 FINAL  Final  Culture, blood (routine x 2)     Status: None (Preliminary result)   Collection Time: 07/14/20  2:06 PM   Specimen: BLOOD LEFT ARM  Result Value Ref Range Status   Specimen Description BLOOD LEFT ARM  Final   Special Requests   Final    BOTTLES DRAWN AEROBIC ONLY Blood Culture adequate volume   Culture   Final    NO GROWTH <12 HOURS Performed at [facility], [address] [place], [place] [address]    Report Status PENDING  Incomplete  Culture, blood (routine x 2)     Status: None (Preliminary result)   Collection Time: 07/14/20  2:10 PM   Specimen: BLOOD LEFT HAND  Result Value Ref Range Status   Specimen Description BLOOD LEFT HAND  Final   Special Requests   Final    BOTTLES DRAWN AEROBIC ONLY Blood Culture adequate volume   Culture   Final    NO GROWTH <12 HOURS Performed at [facility], [place] [address] [place], [place] [address]    Report Status PENDING  Incomplete     RN Pressure Injury Documentation:     Estimated body mass index is 19.68 kg/m as calculated from the following:   Height as of this encounter: '5\' 11"'  (1.803 m).   Weight as of this encounter: 64 kg.  Malnutrition Type:  Nutrition Problem: Severe Malnutrition Etiology: chronic illness   Malnutrition Characteristics:  Signs/Symptoms: severe muscle depletion, energy intake < or equal to 75% for > or equal to 1 month, moderate fat depletion, edema   Nutrition Interventions:  Interventions: Tube feeding, Prostat   Radiology Studies: DG CHEST PORT 1 VIEW  Result Date: 07/14/2020 CLINICAL DATA:  Shortness of breath. EXAM: PORTABLE CHEST 1 VIEW COMPARISON:  Chest x-ray dated July 12, 2020. FINDINGS: Unchanged tracheostomy tube, feeding tube, and right upper extremity PICC line. Stable cardiomegaly. Unchanged diffuse interstitial coarsening. No focal consolidation, pleural effusion, or pneumothorax. No acute osseous abnormality. IMPRESSION: 1. Unchanged chronic interstitial lung disease. Electronically Signed   By: Titus Dubin M.D.   On: 07/14/2020 11:42   Scheduled Meds:  amiodarone  200 mg Per Tube Daily   chlorhexidine gluconate (MEDLINE KIT)  15 mL Mouth Rinse BID   Chlorhexidine Gluconate Cloth  6 each Topical Q0600   [START ON 07/15/2020] feeding supplement (PROSource TF)  45 mL Per Tube Daily   free water  150 mL Per Tube Q4H   furosemide  20 mg Intravenous Daily   influenza vac split quadrivalent PF  0.5 mL Intramuscular Tomorrow-1000   insulin aspart  0-15 Units Subcutaneous Q4H   insulin aspart  3 Units Subcutaneous Q4H   ipratropium  0.5 mg Nebulization Q6H   lactulose  30 g Per Tube Daily   levalbuterol  0.63 mg Nebulization Q6H   levETIRAcetam  500 mg Per Tube BID   mouth rinse  15 mL Mouth Rinse 10 times per day   pantoprazole sodium  40 mg Per  Tube Daily   predniSONE  40 mg Per Tube Q breakfast   silver sulfADIAZINE   Topical BID   sodium chloride flush  10-40 mL Intracatheter Q12H   Continuous Infusions:  sodium chloride Stopped (07/13/20 0007)   dextrose Stopped (07/12/20 0103)   feeding supplement (OSMOLITE 1.5 CAL) 1,000 mL (07/14/20 1237)   heparin 1,300 Units/hr (07/14/20 0527)   meropenem (MERREM) IV 2 g (07/14/20 1456)    LOS: 22 days   Kerney Elbe, DO [facility] PAGER is on AMION  If 7PM-7AM, please contact night-coverage [email or web address]

## 2020-07-14 NOTE — Progress Notes (Signed)
Inpatient Diabetes Program Recommendations  AACE/ADA: New Consensus Statement on Inpatient Glycemic Control (2015)  Target Ranges:  Prepandial:   less than 140 mg/dL      Peak postprandial:   less than 180 mg/dL (1-2 hours)      Critically ill patients:  140 - 180 mg/dL   Lab Results  Component Value Date   GLUCAP 88 07/14/2020   HGBA1C 5.5 06/23/2020    Review of Glycemic Control Results for HEYLEE, TANT (MRN [id number]) as of 07/14/2020 11:33  Ref. Range 07/13/2020 19:53 07/13/2020 20:19 07/13/2020 23:57 07/14/2020 03:52 07/14/2020 08:15  Glucose-Capillary Latest Ref Range: 70 - 99 mg/dL 67 (L) 147 (H) 104 (H) 137 (H) 88   Diabetes history: No DM hx noted Outpatient Diabetes medications: none Current orders for Inpatient glycemic control: Novolog 5 units Q4H, Novolog 0-15 units Q4H Prednisone 40 mg QAM Osmolite @ 60 ml/hr  Inpatient Diabetes Program Recommendations:    Consider decreasing tube feed coverage to Novolog 3 units Q4H (to be stopped in the event tube feeds are held or stopped).   Thanks, Bronson Curb, MSN, RNC-OB Diabetes Coordinator [phone] (8a-5p)

## 2020-07-14 NOTE — Progress Notes (Signed)
Pharmacy Antibiotic Note  Carolyn Proctor is a 59 y.o. female admitted on 06/22/2020 with pneumonia, sepsis and UTI.  There was concern for development of pneumonia, and patient was started empirically on vancomycin and cefepime. Once TA culture grew GNR, vancomycin was discontinued. Patient found to have ESBL E.coli in trach aspirate on 9/29 and treated for 7 days with meropenem. Now, pharmacy has been reconsulted for meropenem dosing. Pt is now febrile, WBC up 21.5. Meropenem restarted at recommendation of CCM. Patient to be recultured  Plan: Meropenem 2g IV q8h  Monitor clinical progress, CBC, micro data, renal function, and fever trends.  Height: 5\' 11"  (180.3 cm) Weight: 64 kg (141 lb 1.5 oz) IBW/kg (Calculated) : 70.8  Temp (24hrs), Avg:100.4 F (38 C), Min:98.5 F (36.9 C), Max:102 F (38.9 C)  Recent Labs  Lab 07/10/20 0518 07/11/20 0523 07/12/20 0538 07/13/20 1055 07/14/20 0514  WBC 20.9* 17.9* 17.6* 22.7* 21.5*  CREATININE 0.95 1.17* 1.07* 1.13* 1.20*    Estimated Creatinine Clearance: 52.3 mL/min (A) (by C-G formula based on SCr of 1.2 mg/dL (H)).    No Known Allergies  Antimicrobials this admission: Vanc 9/15 x 1; restart 9/16 >> 9/17; 9/27>9/28 Zosyn 9/15 x 1; restart 9/16 >> 9/20 Cefepime 9/27>9/29 Meropenem 9/29>10/5, 10/7>>  9/15 BCx >> 2/4 Strep mitis/oralis (likely contaminant) 9/15 UCx >> 100k col ESBL Ecoli (s-zosyn) 9/16 RCx (trach aspirate) - negative 9/16 MRSA- negative  9/26: Bcx : ng 2 days 9/26: Rcx: E coli, ESBL   Ledger Heindl A. Levada Dy, PharmD, BCPS, FNKF Clinical Pharmacist [place] Please utilize Amion for appropriate phone number to reach the unit pharmacist ([place])

## 2020-07-14 NOTE — Progress Notes (Signed)
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), Afib, protein C deficiency  No Known Allergies  Patient Measurements: Height: 5\' 11"  (180.3 cm) Weight: 64 kg (141 lb 1.5 oz) IBW/kg (Calculated) : 70.8 Heparin Dosing Weight: 74.2 kg  Vital Signs: Temp: 99.1 F (37.3 C) (10/07 1939) Temp Source: Axillary (10/07 1939) BP: 124/66 (10/07 1939) Pulse Rate: 104 (10/07 2001)  Labs: Recent Labs    07/12/20 0538 07/12/20 1810 07/13/20 1055 07/13/20 1943 07/14/20 0514 07/14/20 1406 07/14/20 2001  HGB 11.7*  --  12.2  --  11.2*  --   --   HCT 38.2  --  38.6  --  35.9*  --   --   PLT 336  --  391  --  367  --   --   APTT  --    < > 64* 37*  --  85*  --   HEPARINUNFRC  --    < > 0.41  --   --  0.46 0.55  CREATININE 1.07*  --  1.13*  --  1.20*  --   --    < > = values in this interval not displayed.    Estimated Creatinine Clearance: 52.3 mL/min (A) (by C-G formula based on SCr of 1.2 mg/dL (H)).  Medications:  Infusions:  . sodium chloride Stopped (07/13/20 0007)  . dextrose Stopped (07/12/20 0103)  . feeding supplement (OSMOLITE 1.5 CAL) 1,000 mL (07/14/20 1237)  . heparin 1,300 Units/hr (07/14/20 0527)  . 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 Apixban was held after the 10/1 PM dose for planned trach 10/4. Pharmacy consulted to start a Heparin drip post-procedure while monitoring for bleeding with new trach. If stable - can transition back to Apixaban.   Heparin initiation was held on 10/4 due to bleeding around the trach site but resumed on 10/7. Heparin was resumed at ~0530 this AM. Heparin level and aPTT were correlating and therapeutic on prior check.   Heparin level is therapeutic this PM at 0.55 on current rate of 1300 units/hr.   Goal of Therapy:   Monitor platelets by  anticoagulation protocol: Yes   Plan:  Continue heparin drip at 1300 units/hr Daily Heparin level in AM  Sloan Leiter, PharmD, BCPS, BCCCP Clinical Pharmacist Please refer to [facility] for [place] numbers 07/14/2020, 8:33 PM

## 2020-07-14 NOTE — TOC Progression Note (Signed)
Transition of Care [facility]) - Progression Note    Patient Details  Name: Carolyn Proctor MRN: [id number] Date of Birth: January 16, 1961  [facility] CM/SW Contact  Pollie Friar, RN Phone Number: 07/14/2020, 2:27 PM  Clinical Narrative:    CM received referral for LTACH. CM called and spoke to pts daughter, Carolyn Proctor over the phone and went over the recommendations for LTACH. She is in agreement with both LTACH's in the [place] area looking to see if they could offer a bed.  CM will reach out to both [facility] and [facility] to review pts information for potential bed offers.  TOC following.   Expected Discharge Plan: Long Term Acute Care (LTAC) Barriers to Discharge: Continued Medical Work up  Expected Discharge Plan and Services Expected Discharge Plan: [place] (LTAC)                                               Social Determinants of Health (SDOH) Interventions    Readmission Risk Interventions No flowsheet data found.

## 2020-07-15 ENCOUNTER — Inpatient Hospital Stay (HOSPITAL_COMMUNITY): Payer: BC Managed Care – PPO

## 2020-07-15 DIAGNOSIS — G9340 Encephalopathy, unspecified: Secondary | ICD-10-CM | POA: Diagnosis not present

## 2020-07-15 DIAGNOSIS — I272 Pulmonary hypertension, unspecified: Secondary | ICD-10-CM | POA: Diagnosis not present

## 2020-07-15 DIAGNOSIS — R570 Cardiogenic shock: Secondary | ICD-10-CM | POA: Diagnosis not present

## 2020-07-15 DIAGNOSIS — U071 COVID-19: Secondary | ICD-10-CM | POA: Diagnosis not present

## 2020-07-15 DIAGNOSIS — I50810 Right heart failure, unspecified: Secondary | ICD-10-CM | POA: Diagnosis not present

## 2020-07-15 LAB — CBC WITH DIFFERENTIAL/PLATELET
Abs Immature Granulocytes: 0.58 10*3/uL — ABNORMAL HIGH (ref 0.00–0.07)
Basophils Absolute: 0.1 10*3/uL (ref 0.0–0.1)
Basophils Relative: 1 %
Eosinophils Absolute: 0.3 10*3/uL (ref 0.0–0.5)
Eosinophils Relative: 2 %
HCT: 35.3 % — ABNORMAL LOW (ref 36.0–46.0)
Hemoglobin: 11 g/dL — ABNORMAL LOW (ref 12.0–15.0)
Immature Granulocytes: 4 %
Lymphocytes Relative: 3 %
Lymphs Abs: 0.5 10*3/uL — ABNORMAL LOW (ref 0.7–4.0)
MCH: 33.2 pg (ref 26.0–34.0)
MCHC: 31.2 g/dL (ref 30.0–36.0)
MCV: 106.6 fL — ABNORMAL HIGH (ref 80.0–100.0)
Monocytes Absolute: 0.6 10*3/uL (ref 0.1–1.0)
Monocytes Relative: 4 %
Neutro Abs: 14.7 10*3/uL — ABNORMAL HIGH (ref 1.7–7.7)
Neutrophils Relative %: 86 %
Platelets: 314 10*3/uL (ref 150–400)
RBC: 3.31 MIL/uL — ABNORMAL LOW (ref 3.87–5.11)
RDW: 19.8 % — ABNORMAL HIGH (ref 11.5–15.5)
WBC: 16.8 10*3/uL — ABNORMAL HIGH (ref 4.0–10.5)
nRBC: 0.1 % (ref 0.0–0.2)

## 2020-07-15 LAB — COMPREHENSIVE METABOLIC PANEL
ALT: 67 U/L — ABNORMAL HIGH (ref 0–44)
AST: 45 U/L — ABNORMAL HIGH (ref 15–41)
Albumin: 1.5 g/dL — ABNORMAL LOW (ref 3.5–5.0)
Alkaline Phosphatase: 210 U/L — ABNORMAL HIGH (ref 38–126)
Anion gap: 10 (ref 5–15)
BUN: 47 mg/dL — ABNORMAL HIGH (ref 6–20)
CO2: 19 mmol/L — ABNORMAL LOW (ref 22–32)
Calcium: 7.9 mg/dL — ABNORMAL LOW (ref 8.9–10.3)
Chloride: 110 mmol/L (ref 98–111)
Creatinine, Ser: 1.17 mg/dL — ABNORMAL HIGH (ref 0.44–1.00)
GFR calc non Af Amer: 52 mL/min — ABNORMAL LOW (ref >60)
Glucose, Bld: 138 mg/dL — ABNORMAL HIGH (ref 70–99)
Potassium: 5.2 mmol/L — ABNORMAL HIGH (ref 3.5–5.1)
Sodium: 139 mmol/L (ref 135–145)
Total Bilirubin: 0.7 mg/dL (ref 0.3–1.2)
Total Protein: 5.3 g/dL — ABNORMAL LOW (ref 6.5–8.1)

## 2020-07-15 LAB — GLUCOSE, CAPILLARY
Glucose-Capillary: 104 mg/dL — ABNORMAL HIGH (ref 70–99)
Glucose-Capillary: 111 mg/dL — ABNORMAL HIGH (ref 70–99)
Glucose-Capillary: 168 mg/dL — ABNORMAL HIGH (ref 70–99)
Glucose-Capillary: 204 mg/dL — ABNORMAL HIGH (ref 70–99)
Glucose-Capillary: 194 mg/dL — ABNORMAL HIGH (ref 70–99)

## 2020-07-15 LAB — HEPARIN LEVEL (UNFRACTIONATED): Heparin Unfractionated: 0.38 IU/mL (ref 0.30–0.70)

## 2020-07-15 LAB — MAGNESIUM: Magnesium: 2 mg/dL (ref 1.7–2.4)

## 2020-07-15 LAB — TROPONIN I (HIGH SENSITIVITY)
Troponin I (High Sensitivity): 103 ng/L (ref <18)
Troponin I (High Sensitivity): 86 ng/L — ABNORMAL HIGH (ref <18)

## 2020-07-15 LAB — URINE CULTURE: Culture: NO GROWTH

## 2020-07-15 LAB — PHOSPHORUS: Phosphorus: 2.7 mg/dL (ref 2.5–4.6)

## 2020-07-15 MED ORDER — MORPHINE SULFATE (PF) 2 MG/ML IV SOLN
2.0000 mg | INTRAVENOUS | Status: DC | PRN
  Administered 2020-07-15 – 2020-07-16 (×4): 2 mg via INTRAVENOUS
  Filled 2020-07-15 (×4): qty 1

## 2020-07-15 MED ORDER — NITROGLYCERIN 0.4 MG SL SUBL: 0.4000 mg | SUBLINGUAL_TABLET | SUBLINGUAL | Status: DC | PRN

## 2020-07-15 MED ORDER — ONDANSETRON HCL 4 MG/2ML IJ SOLN
4.0000 mg | Freq: Four times a day (QID) | INTRAMUSCULAR | Status: DC | PRN
  Administered 2020-07-15 – 2020-07-24 (×2): 4 mg via INTRAVENOUS
  Filled 2020-07-15 (×2): qty 2

## 2020-07-15 MED ORDER — ACETAMINOPHEN 160 MG/5ML PO SOLN
650.0000 mg | Freq: Four times a day (QID) | ORAL | Status: DC | PRN
  Administered 2020-07-16 – 2020-07-17 (×2): 650 mg
  Filled 2020-07-15 (×3): qty 20.3

## 2020-07-15 NOTE — Plan of Care (Signed)
  Problem: Education: Goal: Knowledge of General Education information will improve Description Including pain rating scale, medication(s)/side effects and non-pharmacologic comfort measures Outcome: Progressing   

## 2020-07-15 NOTE — Progress Notes (Signed)
PROGRESS NOTE    Carolyn Proctor  DPO:[id number] DOB: 09-Dec-1960 DOA: 06/22/2020 PCP: Lilian Coma., MD   Brief Narrative:  Patient is a 59 year old chronically ill-appearing African-American female with a past medical history significant for proximal atrial fibrillation, history of DVT, history of PE anticoagulated Eliquis, history of heart failure with preserved ejection fraction, history of CKD stage III, history of rheumatoid arthritis, hypercoagulable state including increased protein S antigen, decreased protein C activity and decreased Antithrombin III, history of centrilobular emphysema and pulmonary fibrosis, history of chronic venous insufficiency with chronic foot ulcers, history of diverticulitis, history of mixed connective tissue disease and other comorbidities who felt like she was having a stroke and then developed a left facial droop and went unresponsive.  She subsequently required bag valve mask by EMS and was noted to be hypoglycemic and hypotensive.  She was intubated in the ED for airway protection and head CT was done which was negative.  And because she had refractory hypoglycemia despite multiple D 50 a.m. she was requiring a drip.  PCCM was called to admit for this patient.  She is admitted on 06/22/2020 and initiated on pulse dose steroids and diuresis given that she was in cardiogenic shock.  On 07/02/2020 she was on full ventilatory support and was encephalopathic precluding extubation.  926 she is but not weaning and encephalopathic with dysautonomia and off amiodarone drip and subsequently tracheostomy was placed on 07/11/2020.  Further work-up revealed that she had acute hypoxic respiratory failure with inability to protect her airway in the setting of hypoglycemia and subsequently developing HCAP with an ESBL E. coli and she has interstitial lung disease with connective tissue disease with acute pneumonitis and pulmonary edema.  She was also found to be subsequently an acute  systolic CHF and advanced heart failure team was consulted.  During her hospitalization she also developed atrial flutter with atrial tachycardia runs of nonsustained ventricular tachycardia.  Subsequently she has been placed on many antibiotics and escalated to meropenem given her ESBL.  She has now been on trach collar and she is transferred to [facility] service on 07/14/2020 after a prolonged ICU stay.  Today she spiked a temperature of 102 and became tachycardic and tachypneic.  We will panculture her again and resume antibiotics with IV meropenem. Pulmonary still will be following for trach care.  07/15/2020: She developed emesis this morning as well as chest pain and was given antimedics.  Pulmonary was reconsulted given her worsening respiratory status but she is stabilized.  Family is interested in [facility] [place].  Assessment & Plan:   Active Problems:   Pulmonary HTN (HCC)   Acute encephalopathy   Acute respiratory failure (HCC)   Oxygen desaturation   Cardiogenic shock (HCC)   Acute renal failure with tubular necrosis (HCC)   RVF (right ventricular failure) (HCC)   E. 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 - patient doing well on minimal ventilation settings, trach collar trial - completed meropenem abx treatment because she spiked another temperature will resume for now and likely prolonged treatment and continue for 14 days total  - continue prednisone 40 mg daily for acute pneumonitis in setting of ILD, consider slow steroid taper - Follow CXR -Mains on trach collar now -  SpO2: 100 % O2 Flow Rate (L/min): 8 L/min FiO2 (%): 35 % -Repeat chest x-ray in the a.m. and repeat chest x-ray today showed "Lines and tubes in  stable position. Stable cardiomegaly. Diffuse severe bilateral interstitial prominence again noted. Interim slight increase in interstitial markings noted on today's exam suggesting the possibility of a component of interstitial edema. Tiny left pleural effusion cannot be excluded on today's exam." -Dr. Lake Bells discussed with her outpatient rheumatologist and they feel that this is a vasculitis picture and given her underlying infections and they cannot use more aggressive immunosuppression other than prednisone at this time -Continue with breathing treatment and continue with Xopenex Atrovent -Patient had a emesis episode and likely aspirated PCCM was reconsulted -Recommending continuing ATC as the patient was currently saturating 100% on 20% and wean FiO2 as able.  They are recommending routine trach care as well as as needed antiemetics and if emesis recurs they are recommending to see if Cortrak can  -Will consult PT/OT and OOB to Mobilize as Able  -Will follow CXR in AM and repeat ABG intermittently  -Recommend that if she has further respiratory complications to reconsult and call again. -LTAC looking at the patient and [facility]  Fever and SIRS -Temperature of 102 today and was tachycardic and tachypneic -Currently no clear source of infection and she had just completed treatment for HCAP with ESBL E Coli but this is likely the source and she will likely need a prolonged antibiotic course totaling 14 days per PCCM recommendations -likely this is in the setting of this again  -panculture and repeat blood cultures x2, urinalysis, urine culture and chest x-ray -chest x-ray was as above next-urinalysis showed a cloudy appearance with large hemoglobin, large leukocytes, rare bacteria, greater than 50 RBCs per high-power field, greater than 50 WBCs but urine culture showed no growth next-blood cultures x2 is pending -Resumed IV meropenem  -follow cultures and data monitor temperature curve and  WBC  -WBC remains elevated but is now trending down to 10.8 today  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 Ephriam Jenkins with [facility]). - previously treated with rituximab >> last in November 2020 - defer further immunosuppressive therapy while she is being treated for infection -She will need close rheumatology follow-up at discharge.   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 Atrial flutter/atrial tachycardia with runs of NSVT - new this admission. - advanced heart failure team following intermittently; initially she was in cardiogenic shock due to right ventricular failure/cor pulmonale and echo was repeated and she is improved with milrinone and diuresis -Sequently developed septic shock with ESBL E. coli and was changed from cefepime to meropenem - continue amiodarone 200 mg daily -Cardiology recommending that she needs a right heart catheter currently not a candidate and they are not to pursue this on Friday and Dr. Haroldine Laws feels that she needs to be more stable before even attempts of right heart cath -Patient is +2.584 L since admission -Continues to get IV Lasix 20 mg daily with Dr. Haroldine Laws is going to consider changing to torsemide  Acute metabolic encephalopathy from hypoglycemia, hypotension, hypoxia. Seizure in setting of hypoxia with respiratory failure. -continue keppra -Delirium precautions -Brain MRI with no evidence of vasculitis  -continue with steroids prednisone 40 daily  Pulmonary Embolism in June 2021. -  Continue heparin gtt, consider bridging back to eliquis as she is diagnosed back in  June of this year  Bilateral Pressure Ulcers - Wound Consult - BID Dressing Change w/ silvadene  Hypernatremia (Resolved) - free water not been discontinued, D5W at 25 mL's per hour -Sodium is now 139 -Continue monitor carefully and  repeat CMP in the a.m.  Hyperkalemia -Mild -In the setting of IV potassium replacement ratio yesterday -Potassium is 5.2 -Likely to improve in the setting of diuresis -Continue to monitor and trend repeat CMP in the a.m.  Liver cirrhosis Abnormal LFTs, mildly elevated on last check - f/u LFTs intermittently and will repeat CMP in the a.m; LFTs still remain a little elevated as AST is 45, and ALT is 67 we will continue monitor and trend -continue lactulose, titrate~200 mL of stool per rectal tube  Steroid Induced Hyperglycemia. - SSI with moderate NovoLog sliding scale insulin every 4 hours as well as 3 units subcu every 4 hours scheduled -CBGs ranging from 81-204  Nausea and Emesis -Likely aspirated but will continue with antiemetics and started on IV ondansetron -No further emesis  Chest pain, rule out ACS  -Chest pain this morning -EKG was relatively unremarkable and nonischemic; -Troponin was initially elevated likely is demand ischemia in the setting of her nausea vomiting and her current comorbidities -Troponin levels 103 and trended down to 86 -Added nitroglycerin, morphine and she has had no further chest pain  Severe Protein Calorie Malnutrition. -Nutritionist consulted and she is on Tube feeds with Osmolite 1.5 at 60 mils per hour, 45 mill liters of Prosource tube feeding daily, 150 mils of free water every 4  DVT prophylaxis: Anticoagulated with a heparin drip Code Status: FULL CODE  Family Communication: Discussed with Aunt at bedside  Disposition Plan: Pending further improvement of her clinical status and resolution of her fevers.  Will need clearance by pulmonary as well as cardiology and may require an inpatient right heart cath if she improves  Status is: Inpatient  Remains inpatient appropriate because:Ongoing diagnostic testing needed not appropriate for outpatient work up, Unsafe d/c plan, IV treatments appropriate due to intensity of illness or inability  to take PO and Inpatient level of care appropriate due to severity of illness   Dispo: The patient is from: Home              Anticipated d/c is to: LTAC              Anticipated d/c date is: > 3 days              Patient currently is not medically stable to d/c.  Consultants:   PCCM Transfer  Cardiology  Neurology  Procedures:  9/15 ETT >> 9/15 R IJ TL CVC >>9/27 9/27 PICC >  10/4 Trach  Significant Diagnostic Tests:   9/15 CTH >> negative  9/15 CTA chest/ abd/ pelvis >> significant dilatation of RA and RV consistent with R heart failure; moderate R and small L pleural effusions with associated atelectasis of both lower lobes; underlying chronic cystic and fibrotic lung disease; severe anasarca; nodular contour of liver suggestive of possible cirrhosis  9/15 Echo> LVEF 35-40%, mod decreased LV function, RVSP 75, severely reduced RV systolic function; R to left shunt noted, suggestive of PFO, RA severely dilated, mod pericardial effusion  9/15 CTA head and neck >> no significant carotid or vertebral artery stenosis in neck, no LVO, right jugular central venous catheter with probable surrounding hematoma  9/18 MR BRAIN>> no acute intracranial abnormality  9/28 MRI brain > diffuse pachymeningeal enhancement, bilateral cerebral convexities,  sub centimeter extra axial focus of T1 hyperintensity along R temporal occiptal lobe, new  Micro Data:  9/15 BCx2 central line >> NG5D 9/15 BCx2 PIV >> Strept mitis 9/15 SARS2 >> negative 9/15 UC >> ESBL E coli 9/16 Trach asp cult >> Normal flora 9/27 blood > negative 9/27 resp > e coli ESBL  Antimicrobials:  9/15 vanc >> 9/17 9/15 zosyn >> 9/20  9/27 vanc > 9/28 9/27 cefepime > 9/29 9/29 meropenem > 10/5  Antimicrobials:  Anti-infectives (From admission, onward)   Start     Dose/Rate Route Frequency Ordered Stop   07/14/20 1400  meropenem (MERREM) 2 g in sodium chloride 0.9 % 100 mL IVPB        2 g 200 mL/hr over 30  Minutes Intravenous Every 8 hours 07/14/20 1339     07/06/20 1400  meropenem (MERREM) 2 g in sodium chloride 0.9 % 100 mL IVPB        2 g 200 mL/hr over 30 Minutes Intravenous Every 8 hours 07/06/20 1124 07/12/20 2230   07/05/20 1000  vancomycin (VANCOREADY) IVPB 500 mg/100 mL  Status:  Discontinued        500 mg 100 mL/hr over 60 Minutes Intravenous Every 12 hours 07/04/20 2127 07/05/20 0902   07/04/20 2200  ceFEPIme (MAXIPIME) 2 g in sodium chloride 0.9 % 100 mL IVPB  Status:  Discontinued        2 g 200 mL/hr over 30 Minutes Intravenous Every 12 hours 07/04/20 0929 07/06/20 1124   07/04/20 2200  vancomycin (VANCOREADY) IVPB 500 mg/100 mL  Status:  Discontinued        500 mg 100 mL/hr over 60 Minutes Intravenous Every 12 hours 07/04/20 1149 07/04/20 2127   07/04/20 2200  vancomycin (VANCOCIN) IVPB 1000 mg/200 mL premix        1,000 mg 200 mL/hr over 60 Minutes Intravenous  Once 07/04/20 2127 07/05/20 0032   07/04/20 0930  vancomycin (VANCOCIN) IVPB 1000 mg/200 mL premix  Status:  Discontinued        1,000 mg 200 mL/hr over 60 Minutes Intravenous  Once 07/04/20 0929 07/04/20 2127   07/04/20 0930  vancomycin (VANCOREADY) IVPB 500 mg/100 mL  Status:  Discontinued        500 mg 100 mL/hr over 60 Minutes Intravenous Every 12 hours 07/04/20 0929 07/04/20 1149   06/23/20 1015  vancomycin (VANCOREADY) IVPB 750 mg/150 mL  Status:  Discontinued        750 mg 150 mL/hr over 60 Minutes Intravenous Every 12 hours 06/23/20 1007 06/24/20 0849   06/23/20 1000  piperacillin-tazobactam (ZOSYN) IVPB 3.375 g        3.375 g 12.5 mL/hr over 240 Minutes Intravenous Every 8 hours 06/23/20 0948 06/28/20 0805   06/22/20 1230  vancomycin (VANCOREADY) IVPB 1250 mg/250 mL        1,250 mg 166.7 mL/hr over 90 Minutes Intravenous  Once 06/22/20 1226 06/22/20 1535   06/22/20 1230  piperacillin-tazobactam (ZOSYN) IVPB 3.375 g        3.375 g 100 mL/hr over 30 Minutes Intravenous  Once 06/22/20 1226 06/22/20 1502         Subjective: Seen and examined at bedside and she was more interactive today and was complaining some chest pain.  She had just vomited early this morning and had several episodes of emesis but stopped after antiemetics.  Chest pain resolved subsequently.  Pulmonary was reconsulted given her slightly worsening respiratory status but her oxygenation is good.  She continues to remain tachycardic and slightly tachypneic and will continue antibiotics and fever curve is trending down now.  No other concerns or complaints at this time and will try to mobilize the patient and obtain PT and OT to further evaluate and treat.  Objective: Vitals:   07/15/20 0409 07/15/20 0414 07/15/20 0951 07/15/20 1224  BP: 117/68  112/64 114/71  Pulse: (!) 108  (!) 107 (!) 106  Resp: (!) 22  18 (!) 27  Temp: 99.6 F (37.6 C)  (!) 100.5 F (38.1 C) 98.3 F (36.8 C)  TempSrc: Axillary  Oral Axillary  SpO2: 98%  100% 100%  Weight:  63.9 kg    Height:        Intake/Output Summary (Last 24 hours) at 07/15/2020 1656 Last data filed at 07/15/2020 1100 Gross per 24 hour  Intake 2253.03 ml  Output 1900 ml  Net 353.03 ml   Filed Weights   07/09/20 0500 07/10/20 0500 07/15/20 0414  Weight: 63.7 kg 64 kg 63.9 kg   Examination: Physical Exam:  Constitutional: The patient is a thin chronically ill-appearing African-American female currently in mild distress appears anxious and does appear uncomfortable Eyes: Lids and conjunctivae normal, sclerae anicteric  ENMT: External Ears, Nose appear normal. Grossly normal hearing.  Has a core track in place Neck: Tracheostomy in place connected to the ATC and Respiratory: Diminished to auscultation bilaterally with slightly coarse breath sounds and some rhonchi and crackles.  She is tachypneic and using some accessory muscles. Cardiovascular: Tachycardic rate but regular rhythm., no murmurs / rubs / gallops. S1 and S2 auscultated.  Has some lower extremity edema. Abdomen:  Soft, non-tender, non-distended. Bowel sounds positive.  GU: Deferred.  Foley catheter in place as well as soft rectal Flexi-Seal Musculoskeletal: No clubbing / cyanosis of digits/nails. No joint deformity upper and lower extremities.  Legs are in pressure boots Skin: Has bilateral pressure ulcers no induration; Warm and dry.  Neurologic: CN 2-12 grossly intact with no focal deficits. Romberg sign and cerebellar reflexes not assessed.  Psychiatric: Slightly impaired judgment and insight.  He is awake and alert and oriented x 2. Normal mood and appropriate affect.   Data Reviewed: I have personally reviewed following labs and imaging studies  CBC: Recent Labs  Lab 07/11/20 0523 07/12/20 0538 07/13/20 1055 07/14/20 0514 07/15/20 0517  WBC 17.9* 17.6* 22.7* 21.5* 16.8*  NEUTROABS  --   --   --   --  14.7*  HGB 10.5* 11.7* 12.2 11.2* 11.0*  HCT 35.5* 38.2 38.6 35.9* 35.3*  MCV 109.2* 106.7* 104.9* 106.5* 106.6*  PLT 263 336 391 367 035   Basic Metabolic Panel: Recent Labs  Lab 07/11/20 0523 07/12/20 0538 07/13/20 1055 07/14/20 0514 07/15/20 0517  NA 144 140 140 142 139  K 5.1 5.0 4.9 4.7 5.2*  CL 112* 111 110 109 110  CO2 29 19* 20* 21* 19*  GLUCOSE 105* 98 119* 120* 138*  BUN 64* 56* 52* 52* 47*  CREATININE 1.17* 1.07* 1.13* 1.20* 1.17*  CALCIUM 8.2* 8.0* 8.1* 8.2* 7.9*  MG  --  2.2  --  2.0 2.0  PHOS  --  3.7  --  3.0 2.7   GFR: Estimated Creatinine Clearance: 53.5 mL/min (A) (by C-G formula based on SCr of 1.17 mg/dL (H)). Liver Function Tests: Recent Labs  Lab 07/15/20 0517  AST 45*  ALT 67*  ALKPHOS 210*  BILITOT 0.7  PROT 5.3*  ALBUMIN 1.5*   No results for input(s): LIPASE,  AMYLASE in the last 168 hours. No results for input(s): AMMONIA in the last 168 hours. Coagulation Profile: Recent Labs  Lab 07/11/20 0523  INR 1.5*   Cardiac Enzymes: No results for input(s): CKTOTAL, CKMB, CKMBINDEX, TROPONINI in the last 168 hours. BNP (last 3 results) No  results for input(s): PROBNP in the last 8760 hours. HbA1C: No results for input(s): HGBA1C in the last 72 hours. CBG: Recent Labs  Lab 07/14/20 2354 07/15/20 0407 07/15/20 0955 07/15/20 1233 07/15/20 1624  GLUCAP 81 104* 111* 168* 204*   Lipid Profile: No results for input(s): CHOL, HDL, LDLCALC, TRIG, CHOLHDL, LDLDIRECT in the last 72 hours. Thyroid Function Tests: No results for input(s): TSH, T4TOTAL, FREET4, T3FREE, THYROIDAB in the last 72 hours. Anemia Panel: No results for input(s): VITAMINB12, FOLATE, FERRITIN, TIBC, IRON, RETICCTPCT in the last 72 hours. Sepsis Labs: No results for input(s): PROCALCITON, LATICACIDVEN in the last 168 hours.  Recent Results (from the past 240 hour(s))  Culture, Urine     Status: None   Collection Time: 07/14/20  1:15 PM   Specimen: Urine, Random  Result Value Ref Range Status   Specimen Description URINE, RANDOM  Final   Special Requests NONE  Final   Culture   Final    NO GROWTH Performed at [facility], [address], [place], [place] [address]    Report Status 07/15/2020 FINAL  Final  Culture, blood (routine x 2)     Status: None (Preliminary result)   Collection Time: 07/14/20  2:06 PM   Specimen: BLOOD LEFT ARM  Result Value Ref Range Status   Specimen Description BLOOD LEFT ARM  Final   Special Requests   Final    BOTTLES DRAWN AEROBIC ONLY Blood Culture adequate volume   Culture   Final    NO GROWTH < 24 HOURS Performed at [facility], [place] [address] [place], [place] [address]    Report Status PENDING  Incomplete  Culture, blood (routine x 2)     Status: None (Preliminary result)   Collection Time: 07/14/20  2:10 PM   Specimen: BLOOD LEFT HAND  Result Value Ref Range Status   Specimen Description BLOOD LEFT HAND  Final   Special Requests   Final    BOTTLES DRAWN AEROBIC ONLY Blood Culture adequate volume   Culture   Final    NO GROWTH < 24 HOURS Performed at [facility], [place] [address], [place], [place] [address]    Report Status PENDING  Incomplete     RN Pressure Injury Documentation:     Estimated body mass index is 19.65 kg/m as calculated from the following:   Height as of this encounter: '5\' 11"'  (1.803 m).   Weight as of this encounter: 63.9 kg.  Malnutrition Type:  Nutrition Problem: Severe Malnutrition Etiology: chronic illness   Malnutrition Characteristics:  Signs/Symptoms: severe muscle depletion, energy intake < or equal to 75% for > or equal to 1 month, moderate fat depletion, edema   Nutrition Interventions:  Interventions: Tube feeding, Prostat   Radiology Studies: DG CHEST PORT 1 VIEW  Result Date: 07/15/2020 CLINICAL DATA:  Shortness of breath. EXAM: PORTABLE CHEST 1 VIEW COMPARISON:  07/14/2020.  07/12/2020.  06/23/2020. FINDINGS: Tracheostomy tube, feeding tube, right PICC line in stable position. Stable cardiomegaly. Diffuse severe bilateral interstitial prominence again noted. Interim slight increase interstitial markings noted on today's exam suggesting a component of interstitial edema. Tiny left pleural effusion cannot be excluded on  today's exam. No pneumothorax. IMPRESSION: 1. Lines and tubes in stable position. 2. Stable cardiomegaly. 3. Diffuse severe bilateral interstitial prominence again noted. Interim slight increase in interstitial markings noted on today's exam suggesting the possibility of a component of interstitial edema. Tiny left pleural effusion cannot be excluded on today's exam. Electronically Signed   By: Marcello Moores  Register   On: 07/15/2020 07:27   DG CHEST PORT 1 VIEW  Result Date: 07/14/2020 CLINICAL DATA:  Shortness of breath. EXAM: PORTABLE CHEST 1 VIEW COMPARISON:  Chest x-ray dated July 12, 2020. FINDINGS: Unchanged tracheostomy tube, feeding tube, and right upper extremity PICC line. Stable cardiomegaly. Unchanged diffuse interstitial coarsening. No focal consolidation, pleural effusion, or pneumothorax. No acute  osseous abnormality. IMPRESSION: 1. Unchanged chronic interstitial lung disease. Electronically Signed   By: Titus Dubin M.D.   On: 07/14/2020 11:42   Scheduled Meds: . amiodarone  200 mg Per Tube Daily  . chlorhexidine gluconate (MEDLINE KIT)  15 mL Mouth Rinse BID  . Chlorhexidine Gluconate Cloth  6 each Topical Q0600  . feeding supplement (PROSource TF)  45 mL Per Tube Daily  . free water  150 mL Per Tube Q4H  . furosemide  20 mg Intravenous Daily  . influenza vac split quadrivalent PF  0.5 mL Intramuscular Tomorrow-1000  . insulin aspart  0-15 Units Subcutaneous Q4H  . insulin aspart  3 Units Subcutaneous Q4H  . ipratropium  0.5 mg Nebulization TID  . lactulose  30 g Per Tube Daily  . levalbuterol  0.63 mg Nebulization TID  . levETIRAcetam  500 mg Per Tube BID  . mouth rinse  15 mL Mouth Rinse 10 times per day  . pantoprazole sodium  40 mg Per Tube Daily  . predniSONE  40 mg Per Tube Q breakfast  . silver sulfADIAZINE   Topical BID  . sodium chloride flush  10-40 mL Intracatheter Q12H   Continuous Infusions: . sodium chloride Stopped (07/13/20 0007)  . dextrose Stopped (07/12/20 0103)  . feeding supplement (OSMOLITE 1.5 CAL) 1,000 mL (07/15/20 0308)  . heparin 1,300 Units/hr (07/14/20 2033)  . meropenem (MERREM) IV 2 g (07/15/20 1328)    LOS: 23 days   Kerney Elbe, DO [facility] PAGER is on [place]  If 7PM-7AM, please contact night-coverage [email or web address]

## 2020-07-15 NOTE — TOC Progression Note (Signed)
Transition of Care (TOC) - Progression Note    Patient Details  Name: Carolyn Proctor MRN: [id number] Date of Birth: 05/15/1961  Transition of Care (TOC) CM/SW Contact  Kelli F Willard, RN Phone Number: 07/15/2020, 4:34 PM  Clinical Narrative:    CM met with the patient and her daughter at the bedside. Daughter is interested in [facility]. CM has updated [facility] with [facility]. The will assess and start insurance as appropriate.  TOC following.   Expected Discharge Plan: Long Term Acute Care (LTAC) Barriers to Discharge: Continued Medical Work up  Expected Discharge Plan and Services Expected Discharge Plan: Long Term Acute Care (LTAC)                                               Social Determinants of Health (SDOH) Interventions    Readmission Risk Interventions No flowsheet data found.  

## 2020-07-15 NOTE — Progress Notes (Signed)
Attending Sheikh and Heart Failure notified of pt having chest pain and emesis this morning. Orders are in place.

## 2020-07-15 NOTE — Consult Note (Addendum)
[place] Nurse Consult Note: Patient receiving care in [facility] [phone] Patient has a lot of moisture in the perineal area with yeasty smell. She has a foley catheter and a trach. Receiving continuous tube feedings.  Reason for Consult: Labia/Buttock/Right Upper Back Wound type: Partial thickness breakdown on left buttock. No open wounds found on labia or upper back.  Measurement: 2 cm x 1.7 cm Wound bed: Pink and moist Drainage (amount, consistency, odor) None Periwound: Intact  Dressing procedure/placement/frequency: Continue foam dressing to the left buttock. Peel down all sites with a foam dressing EACH shift.  Record your observations.  Change foam dressing every 3 days or PRN soiling.  Notify the physician team if the area worsens  Monitor the wound area(s) for worsening of condition such as: Signs/symptoms of infection,  Increase in size,  Development of or worsening of odor, Development of pain, or increased pain at the affected locations.   Notify the medical team if any of these develop.  Thank you for the consult.  Discussed plan of care with the bedside nurse.  [place] nurse will not follow at this time.   Please re-consult the [place] team if needed.  Cathlean Marseilles Tamala Julian, MSN, RN, [place], Lysle Pearl, [facility] Wound Treatment Associate Pager [phone]

## 2020-07-15 NOTE — Progress Notes (Signed)
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), Afib, protein C deficiency  No Known Allergies  Patient Measurements: Height: 5\' 11"  (180.3 cm) Weight: 63.9 kg (140 lb 14 oz) IBW/kg (Calculated) : 70.8 Heparin Dosing Weight: 74.2 kg  Vital Signs: Temp: 99.6 F (37.6 C) (10/08 0409) Temp Source: Axillary (10/08 0409) BP: 117/68 (10/08 0409) Pulse Rate: 108 (10/08 0409)  Labs: Recent Labs    07/13/20 1055 07/13/20 1055 07/13/20 1943 07/14/20 0514 07/14/20 1406 07/14/20 2001 07/15/20 0517  HGB 12.2   < >  --  11.2*  --   --  11.0*  HCT 38.6  --   --  35.9*  --   --  35.3*  PLT 391  --   --  367  --   --  314  APTT 64*  --  37*  --  85*  --   --   HEPARINUNFRC 0.41   < >  --   --  0.46 0.55 0.38  CREATININE 1.13*  --   --  1.20*  --   --  1.17*   < > = values in this interval not displayed.    Estimated Creatinine Clearance: 53.5 mL/min (A) (by C-G formula based on SCr of 1.17 mg/dL (H)).  Medications:  Infusions:  . sodium chloride Stopped (07/13/20 0007)  . dextrose Stopped (07/12/20 0103)  . feeding supplement (OSMOLITE 1.5 CAL) 1,000 mL (07/15/20 0308)  . heparin 1,300 Units/hr (07/14/20 2033)  . meropenem (MERREM) IV 2 g (07/14/20 2215)    Assessment: 35 yof on Apixaban for recent hx DVT/PE in June 2021, also with hx Afib and protein S deficiency. Apixban was held after the 10/1 PM dose for planned trach 10/4. Pharmacy consulted to start a Heparin drip post-procedure while monitoring for bleeding with new trach. If stable - can transition back to Apixaban.   Heparin initiation was held on 10/4 due to bleeding around the trach site but resumed on 10/7. Heparin was resumed at ~0530 in AM on 10/7. Heparin level and aPTT were correlating and therapeutic on prior checks.   Heparin level is therapeutic this AM at 0.38 on current rate of 1300 units/hr.   Goal of Therapy:   Monitor platelets by  anticoagulation protocol: Yes   Plan:  Continue heparin drip at 1300 units/hr Daily Heparin level in AM  Brok Stocking A. Levada Dy, PharmD, BCPS, FNKF Clinical Pharmacist [place] Please utilize Amion for appropriate phone number to reach the unit pharmacist ([place])

## 2020-07-15 NOTE — Progress Notes (Signed)
NAME:  Carolyn Proctor, MRN:  [id number], DOB:  12-Mar-1961, LOS: 23 ADMISSION DATE:  06/22/2020, CONSULTATION DATE:  9/15 REFERRING MD:  Tyrone Nine, CHIEF COMPLAINT:  Confusion, refractory hypoglycemia   Brief History   76 yoF at home who complained to family she felt like she was having a stroke, then developed left facial droop and went unresponsive.  Required BVM by EMS and noted to be hypoglycemic and hypotensive.  Intubated in ER for airway protection.  CTH negative.  Refractory hypoglycemia despite multiple D50 amps and required a drip.  PCCM called to admit.   [facility] hospitalist became primary for patient 10/7 and at that time PCCM began seeing patient once weekly for trach care.  We were called back 10/8 due to change in clinical picture.  Per RT report patient had episode of significant emesis after tracheal suction morning of 10/8.  This resulted in short-term tachypnea and increased supplemental oxygen demand.  On bedside assessment 10/8 patient is comfortably lying in bed on 20% aerosolized trach collar in no acute respiratory distress.  Past Medical History  PAF, DVT, PE on Eliquis HFpEF CKD stage III RA Hypercoagulable state (increased protein S antigen, decreased protein C activity, and decreased Antithrombin III) Centrilobular emphysema and pulmonary fibrosis- non smoker Chronic venous insufficiency w/ chronic foot ulcers  Diverticulitis   Significant Hospital Events   9/15 admitted/ intubated >> pulse dose steroids, diuresis, cardiogenic shock support 9/25 on full vent support, encephalopathy precluding extubation 9/26 not weaning, encephalopathic, dysautonomia, off milrinonoe 10/4 Trach    Consults:  Neurology Advanced heart failure  Procedures:  9/15 ETT >> 9/15 R IJ TL CVC >>9/27 9/27 PICC >  10/4 Trach  Significant Diagnostic Tests:   9/15 CTH >> negative  9/15 CTA chest/ abd/ pelvis >> significant dilatation of RA and RV consistent with R heart failure;  moderate R and small L pleural effusions with associated atelectasis of both lower lobes; underlying chronic cystic and fibrotic lung disease; severe anasarca; nodular contour of liver suggestive of possible cirrhosis  9/15 Echo> LVEF 35-40%, mod decreased LV function, RVSP 75, severely reduced RV systolic function; R to left shunt noted, suggestive of PFO, RA severely dilated, mod pericardial effusion  9/15 CTA head and neck >> no significant carotid or vertebral artery stenosis in neck, no LVO, right jugular central venous catheter with probable surrounding hematoma   9/18 MR BRAIN>> no acute intracranial abnormality  9/28 MRI brain > diffuse pachymeningeal enhancement, bilateral cerebral convexities, sub centimeter extra axial focus of T1 hyperintensity along R temporal occiptal lobe, new  Micro Data:  9/15 BCx2 central line >> NG5D 9/15 BCx2 PIV >> Strept mitis 9/15 SARS2 >> negative 9/15 UC >> ESBL E coli 9/16 Trach asp cult >> Normal flora 9/27 blood > negative 9/27 resp > e coli ESBL  Antimicrobials:  9/15 vanc >> 9/17 9/15 zosyn >> 9/20  9/27 vanc > 9/28 9/27 cefepime > 9/29 9/29 meropenem > 10/5  Interim history/subjective:  Lying in bed in no acute distress. Respiratory therapist reports episode of emesis morning of 10/8 after tracheal suctioning.  Objective   Blood pressure 114/71, pulse (!) 106, temperature 98.3 F (36.8 C), temperature source Axillary, resp. rate (!) 27, height 5\' 11"  (1.803 m), weight 63.9 kg, last menstrual period 02/19/2013, SpO2 100 %.    FiO2 (%):  [35 %] 35 %   Intake/Output Summary (Last 24 hours) at 07/15/2020 1528 Last data filed at 07/15/2020 1100 Gross per 24 hour  Intake 2253.03  ml  Output 1900 ml  Net 353.03 ml   Filed Weights   07/09/20 0500 07/10/20 0500 07/15/20 0414  Weight: 63.7 kg 64 kg 63.9 kg    Examination: General: Chronically ill appearing frail deconditioned middle-aged female lying in bed in no acute  distress HEENT: 6 cuffed Shiley trach midline, MM pink/moist, PERRL,  Neuro: Opens eyes to verbal stimuli and will track movement in room but will not follow any commands CV: s1s2 regular rate and rhythm, no murmur, rubs, or gallops,  PULM: Bilateral rhonchi cleared with cough, tolerating secretions well, no acute respiratory distress, oxygen saturation 90% on 28% ATC GI: soft, bowel sounds active in all 4 quadrants, non-tender, non-distended, tolerating TF Extremities: warm/dry, no edema  Skin: no rashes or lesions  Resolved Hospital Problem list   ESBL E. 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 -Patient has been tolerating ATC well.  Episode of emesis morning of 10/8 with possible aspiration.  Patient was seen with increased respiratory rate and episode of mild hypoxia requiring increased supplemental oxygen.  This prompted reconsulted PCCM. P: Continue ATC, currently saturating 100% on 28%, wean FiO2 as able Routine trach care As needed antiemetics If emesis recurs can see if cortrack can be placed postpyloric Continue prolonged steroid taper, currently prednisone 40 mg daily  Aggressive pulmonary hygiene Mobilize as able Follow intermittent chest x-ray and ABG Agree with resumption of IV meropenem given spike in fever, likely will need prolonged antibiotic course totaling 14 days for ESBL E. 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 Ephriam Jenkins with [facility]). -  previously treated with rituximab >> last in November 2020 - defer further immunosuppressive therapy while she is being treated for infection P: Supportive care   Rest of acute and chronic medical conditions managed by primary and other consulting specialties   PCCM will continue to follow for trach management 1-2 times per week   Best practice:  Diet: tube feeding DVT prophylaxis: heparin gtt GI prophylaxis: protonix Mobility: bed rest Code Status: full Disposition: ICU   Signature:  Johnsie Cancel, NP-C [facility] Contact / Pager information can be found on Amion  07/15/2020, 3:47 PM

## 2020-07-16 ENCOUNTER — Inpatient Hospital Stay (HOSPITAL_COMMUNITY): Payer: BC Managed Care – PPO

## 2020-07-16 DIAGNOSIS — G9340 Encephalopathy, unspecified: Secondary | ICD-10-CM | POA: Diagnosis not present

## 2020-07-16 DIAGNOSIS — I272 Pulmonary hypertension, unspecified: Secondary | ICD-10-CM | POA: Diagnosis not present

## 2020-07-16 DIAGNOSIS — I50810 Right heart failure, unspecified: Secondary | ICD-10-CM | POA: Diagnosis not present

## 2020-07-16 DIAGNOSIS — D539 Nutritional anemia, unspecified: Secondary | ICD-10-CM

## 2020-07-16 DIAGNOSIS — R570 Cardiogenic shock: Secondary | ICD-10-CM | POA: Diagnosis not present

## 2020-07-16 LAB — COMPREHENSIVE METABOLIC PANEL WITH GFR
ALT: 53 U/L — ABNORMAL HIGH (ref 0–44)
AST: 33 U/L (ref 15–41)
Albumin: 1.5 g/dL — ABNORMAL LOW (ref 3.5–5.0)
Alkaline Phosphatase: 199 U/L — ABNORMAL HIGH (ref 38–126)
Anion gap: 7 (ref 5–15)
BUN: 39 mg/dL — ABNORMAL HIGH (ref 6–20)
CO2: 24 mmol/L (ref 22–32)
Calcium: 8.2 mg/dL — ABNORMAL LOW (ref 8.9–10.3)
Chloride: 105 mmol/L (ref 98–111)
Creatinine, Ser: 1.08 mg/dL — ABNORMAL HIGH (ref 0.44–1.00)
GFR, Estimated: 57 mL/min — ABNORMAL LOW
Glucose, Bld: 162 mg/dL — ABNORMAL HIGH (ref 70–99)
Potassium: 5.4 mmol/L — ABNORMAL HIGH (ref 3.5–5.1)
Sodium: 136 mmol/L (ref 135–145)
Total Bilirubin: 0.9 mg/dL (ref 0.3–1.2)
Total Protein: 5.7 g/dL — ABNORMAL LOW (ref 6.5–8.1)

## 2020-07-16 LAB — CBC WITH DIFFERENTIAL/PLATELET
Abs Immature Granulocytes: 0.73 K/uL — ABNORMAL HIGH (ref 0.00–0.07)
Basophils Absolute: 0.1 K/uL (ref 0.0–0.1)
Basophils Relative: 0 %
Eosinophils Absolute: 0 K/uL (ref 0.0–0.5)
Eosinophils Relative: 0 %
HCT: 35.1 % — ABNORMAL LOW (ref 36.0–46.0)
Hemoglobin: 10.8 g/dL — ABNORMAL LOW (ref 12.0–15.0)
Immature Granulocytes: 5 %
Lymphocytes Relative: 2 %
Lymphs Abs: 0.3 K/uL — ABNORMAL LOW (ref 0.7–4.0)
MCH: 32.6 pg (ref 26.0–34.0)
MCHC: 30.8 g/dL (ref 30.0–36.0)
MCV: 106 fL — ABNORMAL HIGH (ref 80.0–100.0)
Monocytes Absolute: 1 K/uL (ref 0.1–1.0)
Monocytes Relative: 7 %
Neutro Abs: 13.3 K/uL — ABNORMAL HIGH (ref 1.7–7.7)
Neutrophils Relative %: 86 %
Platelets: 373 K/uL (ref 150–400)
RBC: 3.31 MIL/uL — ABNORMAL LOW (ref 3.87–5.11)
RDW: 19.5 % — ABNORMAL HIGH (ref 11.5–15.5)
WBC: 15.4 K/uL — ABNORMAL HIGH (ref 4.0–10.5)
nRBC: 0.2 % (ref 0.0–0.2)

## 2020-07-16 LAB — GLUCOSE, CAPILLARY
Glucose-Capillary: 102 mg/dL — ABNORMAL HIGH (ref 70–99)
Glucose-Capillary: 106 mg/dL — ABNORMAL HIGH (ref 70–99)
Glucose-Capillary: 124 mg/dL — ABNORMAL HIGH (ref 70–99)
Glucose-Capillary: 127 mg/dL — ABNORMAL HIGH (ref 70–99)
Glucose-Capillary: 134 mg/dL — ABNORMAL HIGH (ref 70–99)
Glucose-Capillary: 72 mg/dL (ref 70–99)
Glucose-Capillary: 98 mg/dL (ref 70–99)

## 2020-07-16 LAB — HEPARIN LEVEL (UNFRACTIONATED): Heparin Unfractionated: 0.3 IU/mL (ref 0.30–0.70)

## 2020-07-16 LAB — PHOSPHORUS: Phosphorus: 3.4 mg/dL (ref 2.5–4.6)

## 2020-07-16 LAB — MAGNESIUM: Magnesium: 1.9 mg/dL (ref 1.7–2.4)

## 2020-07-16 MED ORDER — SODIUM ZIRCONIUM CYCLOSILICATE 10 G PO PACK
10.0000 g | PACK | Freq: Once | ORAL | Status: AC
  Administered 2020-07-16: 10 g
  Filled 2020-07-16: qty 1

## 2020-07-16 NOTE — Evaluation (Signed)
Occupational Therapy Evaluation Patient Details Name: Carolyn Proctor MRN: [id number] DOB: 18-Jan-1961 Today's Date: 07/16/2020    History of Present Illness Pt is a 59 y/o female admitted with acute hypoxic respiratory failure in setting of hypoglycemia, HCAP with ESBL Ecoli, acute pneumonitis and acute pulmonary edema. Pt intubated 9/15 day of admit, tracheostomy placed 10/4. Pt encephalopathic, also found to be in acute systolic CHF and developed  atrial flutter with atrial tachycardia runs of nonsustained ventricular tachycardia during admission. Pt with PMHx includeing afib, hx DVT/PE, CKD, RA,  history of centrilobular emphysema and pulmonary fibrosis, mixed connective tissue disease    Clinical Impression   This 59 y/o female presents with the above. Pt currently with trach, communicating mostly via head nods today and indicating she was independent PTA, living with family. Pt now with global weakness and deconditioning, notable UE deficits including limited ROM, L hand contractures. Pt requiring max hand over hand assist for simple grooming ADL and up to [place] for additional ADL at this time. Use of bed egress to chair position with pt tolerating well but with fluctuating HR (briefly noted up to low 140s) with minimal activity. Given rest breaks with short bouts of activity HR overall maintaining 100-110s with remaining VSS. Pt to benefit from continued acute OT services and currently recommend post acute rehab services [facility]) to progress her overall strength, safety and independence with ADL and mobility.     Follow Up Recommendations  LTACH;Supervision/Assistance - 24 hour    Equipment Recommendations  Other (comment) (defer to next venue)           Precautions / Restrictions Precautions Precautions: Fall Precaution Comments: trach, watch HR Restrictions Weight Bearing Restrictions: No      Mobility Bed Mobility Overal bed mobility: Needs Assistance Bed Mobility: Supine to  Sit     Supine to sit: Total assist;+2 for physical assistance;+2 for safety/equipment     General bed mobility comments: use of bed egress to chair position with +2 assist to bring trunk away from [facility]. deferred EOB attempts today as pt with elevated/fluctuating HR with mobility at bed level and chair egress (briefly up to 140s noted)  Transfers                 General transfer comment: NT today    Balance Overall balance assessment: Needs assistance   Sitting balance-Leahy Scale: Zero Sitting balance - Comments: +2 assist for unsupported balance                                    ADL either performed or assessed with clinical judgement   ADL Overall ADL's : Needs assistance/impaired Eating/Feeding: NPO   Grooming: Wash/dry face;Maximal assistance;Bed level Grooming Details (indicate cue type and reason): max hand over hand assist using RUE to wash face, some assist for thoroughness; requires assist to use yonker for secretions at trach                               General ADL Comments: grossly totalA at this time given significant weakness, ROM and deconditioning      Vision         Perception     Praxis      Pertinent Vitals/Pain Pain Assessment: Faces Faces Pain Scale: Hurts a little bit Pain Location: abdomen  Pain Descriptors / Indicators: Discomfort Pain Intervention(s):  Monitored during session     Hand Dominance Left   Extremity/Trunk Assessment Upper Extremity Assessment Upper Extremity Assessment: RUE deficits/detail;LUE deficits/detail RUE Deficits / Details: globally weak throughout, decreased shoulder/scapular ROM, contractures noted at digits especially 2nd and 3rd digit, ?cyanotic fingernails  RUE Coordination: decreased fine motor;decreased gross motor LUE Deficits / Details: globally weak throughout, decreased shoulder/scapular ROM, notable stiffness at wrist and digits, unable to passively flex or extend  wrist, very minimal ROM to digits and some are painful with ROM attempts, ?cyanotic fingernails LUE Coordination: decreased fine motor;decreased gross motor   Lower Extremity Assessment Lower Extremity Assessment: Defer to PT evaluation RLE Deficits / Details: atrophy of the right calf noted compared to left. May be baseline        Communication Communication Communication: Tracheostomy   Cognition Arousal/Alertness: Awake/alert Behavior During Therapy: WFL for tasks assessed/performed Overall Cognitive Status: Difficult to assess                                 General Comments: pt answering yes/no questions (appears appropriate), following all simple commands.    General Comments       Exercises     Shoulder Instructions      Home Living Family/patient expects to be discharged to:: Private residence Living Arrangements:  (unspecified family) Available Help at Discharge: Family Type of Home: House Home Access: Stairs to enter [employer] of Steps:  (just a few )   Home Layout: One level     Bathroom Shower/Tub: Teacher, early years/pre: Standard     Home Equipment: None          Prior Functioning/Environment Level of Independence: Independent        Comments: Per patient she was able to ambualte and was driving prior to hopsitialzation         OT Problem List: Decreased strength;Decreased range of motion;Decreased activity tolerance;Impaired balance (sitting and/or standing);Decreased coordination;Decreased cognition;Decreased safety awareness;Cardiopulmonary status limiting activity;Impaired UE functional use;Pain      OT Treatment/Interventions: Self-care/ADL training;Therapeutic exercise;Energy conservation;DME and/or AE instruction;Therapeutic activities;Cognitive remediation/compensation;Patient/family education;Balance training    OT Goals(Current goals can be found in the care plan section) Acute Rehab OT  Goals Patient Stated Goal: none stated, agreeable to working with therapies  OT Goal Formulation: With patient Time For Goal Achievement: 07/30/20 Potential to Achieve Goals: Fair  OT Frequency: Min 2X/week   Barriers to D/C:            Co-evaluation PT/OT/SLP Co-Evaluation/Treatment: Yes Reason for Co-Treatment: Complexity of the patient's impairments (multi-system involvement)   OT goals addressed during session: ADL's and self-care      AM-PAC OT "6 Clicks" Daily Activity     Outcome Measure Help from another person eating meals?: Total Help from another person taking care of personal grooming?: A Lot Help from another person toileting, which includes using toliet, bedpan, or urinal?: Total Help from another person bathing (including washing, rinsing, drying)?: Total Help from another person to put on and taking off regular upper body clothing?: Total Help from another person to put on and taking off regular lower body clothing?: Total 6 Click Score: 7   End of Session Equipment Utilized During Treatment: Oxygen Nurse Communication: Mobility status  Activity Tolerance: Patient tolerated treatment well Patient left: in bed;with call bell/phone within reach;with bed alarm set  OT Visit Diagnosis: Muscle weakness (generalized) (M62.81);Other abnormalities of gait and mobility (R26.89)  Time: 8110-3159 OT Time Calculation (min): 24 min Charges:  OT General Charges $OT Visit: 1 Visit OT Evaluation $OT Eval High Complexity: 1 High  Lou Cal, OT Acute Rehabilitation Services Pager [phone] Office [phone]  Raymondo Band 07/16/2020, 12:44 PM

## 2020-07-16 NOTE — Progress Notes (Signed)
PROGRESS NOTE    Carolyn Proctor  YHC:[id number] DOB: [birth year] DOA: 06/22/2020 PCP: Lilian Coma., MD   Brief Narrative:  Patient is a 59 year old chronically ill-appearing African-American female with a past medical history significant for proximal atrial fibrillation, history of DVT, history of PE anticoagulated Eliquis, history of heart failure with preserved ejection fraction, history of CKD stage III, history of rheumatoid arthritis, hypercoagulable state including increased protein S antigen, decreased protein C activity and decreased Antithrombin III, history of centrilobular emphysema and pulmonary fibrosis, history of chronic venous insufficiency with chronic foot ulcers, history of diverticulitis, history of mixed connective tissue disease and other comorbidities who felt like she was having a stroke and then developed a left facial droop and went unresponsive.  She subsequently required bag valve mask by EMS and was noted to be hypoglycemic and hypotensive.  She was intubated in the ED for airway protection and head CT was done which was negative.  And because she had refractory hypoglycemia despite multiple D 50 a.m. she was requiring a drip.  PCCM was called to admit for this patient.  She is admitted on 06/22/2020 and initiated on pulse dose steroids and diuresis given that she was in cardiogenic shock.  On 07/02/2020 she was on full ventilatory support and was encephalopathic precluding extubation.  926 she is but not weaning and encephalopathic with dysautonomia and off amiodarone drip and subsequently tracheostomy was placed on 07/11/2020.  Further work-up revealed that she had acute hypoxic respiratory failure with inability to protect her airway in the setting of hypoglycemia and subsequently developing HCAP with an ESBL E. coli and she has interstitial lung disease with connective tissue disease with acute pneumonitis and pulmonary edema.  She was also found to be subsequently an acute  systolic CHF and advanced heart failure team was consulted.  During her hospitalization she also developed atrial flutter with atrial tachycardia runs of nonsustained ventricular tachycardia.  Subsequently she has been placed on many antibiotics and escalated to meropenem given her ESBL.  She has now been on trach collar and she is transferred to [facility] service on 07/14/2020 after a prolonged ICU stay.  Today she spiked a temperature of 102 and became tachycardic and tachypneic.  We will panculture her again and resume antibiotics with IV meropenem. Pulmonary still will be following for trach care.  07/15/2020: She developed emesis this morning as well as chest pain and was given antimedics.  Pulmonary was reconsulted given her worsening respiratory status but she is stabilized.  Family is interested in [place] [facility] [place].  07/16/2020: Temperature curve is improving.  PT OT recommending LTACH.  Also get speech therapy to see the patient cannot use PMV with speech therapy.  SLP not evaluated given fatigue.  Elder Love status appears stable compared to yesterday.  Assessment & Plan:   Active Problems:   Pulmonary HTN (HCC)   Acute encephalopathy   Acute respiratory failure (HCC)   Oxygen desaturation   Cardiogenic shock (HCC)   Acute renal failure with tubular necrosis (HCC)   RVF (right ventricular failure) (HCC)   E. 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 - patient doing well on minimal ventilation settings, trach collar trial - completed meropenem abx treatment because she spiked another temperature  will resume for now and likely prolonged treatment and continue for 14 days total; WBC is improving - continue prednisone 40 mg daily for  acute pneumonitis in setting of ILD, consider slow steroid taper - Follow CXR -Mains on trach collar now -SpO2: 100 % O2 Flow Rate (L/min): 5 L/min FiO2 (%): 28 %; she was on 8 L yesterday and is improving -Repeat chest x-ray in the a.m. and repeat chest x-ray today showed "Stable position of support apparatus.  No pneumothorax. Improved aeration of the lungs with minimal residual bilateral interstitial opacities with basilar predominance nonspecific with differential considerations including asymmetric pulmonary edema versus pulmonary fibrosis." -Dr. Lake Bells discussed with her outpatient rheumatologist and they feel that this is a vasculitis picture and given her underlying infections and they cannot use more aggressive immunosuppression other than prednisone at this time -Continue with breathing treatment and continue with Xopenex Atrovent -Patient had a emesis episode and likely aspirated PCCM was reconsulted -Recommending continuing ATC as the patient was currently saturating 100% on 20% and wean FiO2 as able.  They are recommending routine trach care as well as as needed antiemetics and if emesis recurs they are recommending to see if Cortrak can  -Will consult PT/OT/SLP and OOB to Mobilize as Able; they are recommending LTAC -Will follow CXR in AM and repeat ABG intermittently  -Recommend that if she has further respiratory complications to reconsult and call again. -LTAC looking at the patient and [facility] is the preference   Fever and SIRS, improving  -Temperature of 102 on 07/24/20 and was tachycardic and tachypneic -Currently no clear source of infection and she had just completed treatment for HCAP with ESBL E Coli but this is likely the source and she will likely need a prolonged antibiotic course totaling 14 days per PCCM recommendations -likely this is in the setting of this again  -panculture and repeat blood cultures x2, urinalysis, urine culture and chest x-ray -chest x-ray  was as above  -urinalysis showed a cloudy appearance with large hemoglobin, large leukocytes, rare bacteria, greater than 50 RBCs per high-power field, greater than 50 WBCs but urine culture showed no growth  -Blood cultures x2 showed NGTD at 2 Days -Resumed IV meropenem  -follow cultures and data monitor temperature curve and WBC  -WBC remains elevated but is now trending down to 16.8 yesterday and is 15.4  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 Ephriam Jenkins with [facility]). - previously treated with rituximab >> last in November 2020 - defer further immunosuppressive therapy while she is being treated for infection -She will need close rheumatology follow-up at discharge.   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 Atrial flutter/atrial tachycardia with runs of NSVT - new this admission. - advanced heart failure team following intermittently; initially she was in cardiogenic shock due to right ventricular failure/cor pulmonale and echo was repeated and she is improved with milrinone and diuresis -Sequently developed septic shock with ESBL E. coli and was changed from cefepime to meropenem - continue amiodarone 200 mg daily -Cardiology recommending that she needs a right heart catheter currently not a candidate and they are not to pursue this on Friday and Dr. Haroldine Laws feels that she needs to be more stable before even attempts of right heart cath -Patient is +1.292 L since admission -Continues to get IV Lasix 20 mg daily with Dr. Haroldine Laws is going to consider changing  to torsemide  Acute metabolic encephalopathy from hypoglycemia, hypotension, hypoxia. Seizure in setting of hypoxia with respiratory failure. -continue Keppra -Delirium precautions -Brain MRI with no evidence of vasculitis  -continue with steroids prednisone 40 daily  Pulmonary Embolism  in June 2021. -  Continue heparin gtt, consider bridging back to eliquis as she is diagnosed back in June of this year  Bilateral Pressure Ulcers - Wound Consult - BID Dressing Change w/ silvadene  Hypernatremia (Resolved) - free water not been discontinued, D5W at 25 mL's per hour -Sodium is now 139 -Continue monitor carefully and repeat CMP in the a.m.  Hyperkalemia -Mild and slightly worsened  -In the setting of IV potassium replacement -Potassium was 5.2 and today was 5.4 -Will give Lokelma 10 mg x1 -Likely to improve in the setting of diuresis -Continue to monitor and trend repeat CMP in the a.m.  Liver cirrhosis Abnormal LFTs, mildly elevated on last check - f/u LFTs intermittently and will repeat CMP in the a.m; LFTs still remain a little elevated as AST is 45 and now 33, and ALT is 67 and is now 55 -We will continue monitor and trend -continue lactulose, titrate~200 mL of stool per rectal tube  Steroid Induced Hyperglycemia. - SSI with moderate NovoLog sliding scale insulin every 4 hours as well as 3 units subcu every 4 hours scheduled -CBGs ranging from 81-204  Nausea and Emesis -Likely aspirated but will continue with antiemetics and started on IV ondansetron -No further emesis now -C/w Supportive Care  Macrocytic Anemia -Patient's Hgb/Hct went from 11.2/35.9 -> 11.0/35.3 -> 10.8/35.1 -Check Anemia Panel in the AM -Continue to Monitor for S/Sx of Bleeding; No overt bleeding noted -Repeat CBC in the AM   Chest Pain, rule out ACS  -Chest pain this morning -EKG was relatively unremarkable and nonischemic; -Troponin was initially elevated likely is demand ischemia in the setting of her nausea vomiting and her current comorbidities -Troponin levels 103 and trended down to 86 -Added nitroglycerin, morphine and she has chest pain now when she coughs  Severe Protein Calorie Malnutrition. -Nutritionist consulted and she is on Tube feeds with Osmolite 1.5 at 60  mils per hour, 45 mill liters of Prosource tube feeding daily, 150 mils of free water every 4  DVT prophylaxis: Anticoagulated with a heparin drip Code Status: FULL CODE  Family Communication: Discussed with Aunt at bedside  Disposition Plan: Pending further improvement of her clinical status and resolution of her fevers.  Will need clearance by pulmonary as well as cardiology and may require an inpatient right heart cath if she improves  Status is: Inpatient  Remains inpatient appropriate because:Ongoing diagnostic testing needed not appropriate for outpatient work up, Unsafe d/c plan, IV treatments appropriate due to intensity of illness or inability to take PO and Inpatient level of care appropriate due to severity of illness   Dispo: The patient is from: Home              Anticipated d/c is to: LTAC              Anticipated d/c date is: > 3 days              Patient currently is not medically stable to d/c.  Consultants:   PCCM Transfer  Cardiology  Neurology  Procedures:  9/15 ETT >> 9/15 R IJ TL CVC >>9/27 9/27 PICC >  10/4 Trach  Significant Diagnostic Tests:   9/15 CTH >> negative  9/15 CTA chest/ abd/ pelvis >> significant  dilatation of RA and RV consistent with R heart failure; moderate R and small L pleural effusions with associated atelectasis of both lower lobes; underlying chronic cystic and fibrotic lung disease; severe anasarca; nodular contour of liver suggestive of possible cirrhosis  9/15 Echo> LVEF 35-40%, mod decreased LV function, RVSP 75, severely reduced RV systolic function; R to left shunt noted, suggestive of PFO, RA severely dilated, mod pericardial effusion  9/15 CTA head and neck >> no significant carotid or vertebral artery stenosis in neck, no LVO, right jugular central venous catheter with probable surrounding hematoma  9/18 MR BRAIN>> no acute intracranial abnormality  9/28 MRI brain > diffuse pachymeningeal enhancement, bilateral  cerebral convexities, sub centimeter extra axial focus of T1 hyperintensity along R temporal occiptal lobe, new  Micro Data:  9/15 BCx2 central line >> NG5D 9/15 BCx2 PIV >> Strept mitis 9/15 SARS2 >> negative 9/15 UC >> ESBL E coli 9/16 Trach asp cult >> Normal flora 9/27 blood > negative 9/27 resp > e coli ESBL  Antimicrobials:  9/15 vanc >> 9/17 9/15 zosyn >> 9/20  9/27 vanc > 9/28 9/27 cefepime > 9/29 9/29 meropenem > 10/5  Antimicrobials:  Anti-infectives (From admission, onward)   Start     Dose/Rate Route Frequency Ordered Stop   07/14/20 1400  meropenem (MERREM) 2 g in sodium chloride 0.9 % 100 mL IVPB        2 g 200 mL/hr over 30 Minutes Intravenous Every 8 hours 07/14/20 1339     07/06/20 1400  meropenem (MERREM) 2 g in sodium chloride 0.9 % 100 mL IVPB        2 g 200 mL/hr over 30 Minutes Intravenous Every 8 hours 07/06/20 1124 07/12/20 2230   07/05/20 1000  vancomycin (VANCOREADY) IVPB 500 mg/100 mL  Status:  Discontinued        500 mg 100 mL/hr over 60 Minutes Intravenous Every 12 hours 07/04/20 2127 07/05/20 0902   07/04/20 2200  ceFEPIme (MAXIPIME) 2 g in sodium chloride 0.9 % 100 mL IVPB  Status:  Discontinued        2 g 200 mL/hr over 30 Minutes Intravenous Every 12 hours 07/04/20 0929 07/06/20 1124   07/04/20 2200  vancomycin (VANCOREADY) IVPB 500 mg/100 mL  Status:  Discontinued        500 mg 100 mL/hr over 60 Minutes Intravenous Every 12 hours 07/04/20 1149 07/04/20 2127   07/04/20 2200  vancomycin (VANCOCIN) IVPB 1000 mg/200 mL premix        1,000 mg 200 mL/hr over 60 Minutes Intravenous  Once 07/04/20 2127 07/05/20 0032   07/04/20 0930  vancomycin (VANCOCIN) IVPB 1000 mg/200 mL premix  Status:  Discontinued        1,000 mg 200 mL/hr over 60 Minutes Intravenous  Once 07/04/20 0929 07/04/20 2127   07/04/20 0930  vancomycin (VANCOREADY) IVPB 500 mg/100 mL  Status:  Discontinued        500 mg 100 mL/hr over 60 Minutes Intravenous Every 12 hours 07/04/20  0929 07/04/20 1149   06/23/20 1015  vancomycin (VANCOREADY) IVPB 750 mg/150 mL  Status:  Discontinued        750 mg 150 mL/hr over 60 Minutes Intravenous Every 12 hours 06/23/20 1007 06/24/20 0849   06/23/20 1000  piperacillin-tazobactam (ZOSYN) IVPB 3.375 g        3.375 g 12.5 mL/hr over 240 Minutes Intravenous Every 8 hours 06/23/20 0948 06/28/20 0805   06/22/20 1230  vancomycin (VANCOREADY) IVPB 1250 mg/250  mL        1,250 mg 166.7 mL/hr over 90 Minutes Intravenous  Once 06/22/20 1226 06/22/20 1535   06/22/20 1230  piperacillin-tazobactam (ZOSYN) IVPB 3.375 g        3.375 g 100 mL/hr over 30 Minutes Intravenous  Once 06/22/20 1226 06/22/20 1502       Subjective: Seen and examined at bedside and was more awake and alert today.  Still having some chest pain but not as bad.  Attempting to say something but unable to understand her.  She denies any other pain.  No nausea or vomiting today.  Feels better and happy that her numbers are improving.  No other concerns or complaints at this time.  Objective: Vitals:   07/16/20 1115 07/16/20 1257 07/16/20 1457 07/16/20 1500  BP:  114/76    Pulse: (!) 102 (!) 102  (!) 101  Resp: (!) 24 (!) 27  (!) 25  Temp:  99.3 F (37.4 C)    TempSrc:  Axillary    SpO2: 99% 99% 100%   Weight:      Height:        Intake/Output Summary (Last 24 hours) at 07/16/2020 1611 Last data filed at 07/16/2020 0626 Gross per 24 hour  Intake --  Output 1350 ml  Net -1350 ml   Filed Weights   07/10/20 0500 07/15/20 0414 07/16/20 0455  Weight: 64 kg 63.9 kg 63.8 kg   Examination: Physical Exam:  Constitutional: The patient is a thin chronically ill-appearing AAF in NAD and appears calmer but still appears uncomfortable and fatigued Eyes: Lids and conjunctivae normal, sclerae anicteric  ENMT: External Ears, Nose appear normal. Grossly normal hearing. Has a Cortrak in place Neck: Has a Tracheostomy in place connected to ATC Respiratory: Diminished to  auscultation bilaterally with coarse breath sounds bilaterally and bilateral rhonchi and slight crackles. Slightly increasaed respiratory effort and patient is tachypenic. No accessory muscle use.  Cardiovascular: Tachycardic rate but regular rhythm, no murmurs / rubs / gallops. Minimal extremity edema.  Abdomen: Soft, non-tender, non-distended. Bowel sounds positive.  GU: Deferred. Musculoskeletal: No clubbing / cyanosis of digits/nails. No joint deformity upper and lower extremities. Skin: Bilateral pressure ulcers. No induration; Warm and dry.  Neurologic: CN 2-12 grossly intact and she was trying to talk today; Romberg sign and cerebellar reflexes not assessed.  Psychiatric: Normal judgment and insight. She is Alert and oriented x 3. Normal mood and appropriate affect.   Data Reviewed: I have personally reviewed following labs and imaging studies  CBC: Recent Labs  Lab 07/12/20 0538 07/13/20 1055 07/14/20 0514 07/15/20 0517 07/16/20 0506  WBC 17.6* 22.7* 21.5* 16.8* 15.4*  NEUTROABS  --   --   --  14.7* 13.3*  HGB 11.7* 12.2 11.2* 11.0* 10.8*  HCT 38.2 38.6 35.9* 35.3* 35.1*  MCV 106.7* 104.9* 106.5* 106.6* 106.0*  PLT 336 391 367 314 948   Basic Metabolic Panel: Recent Labs  Lab 07/12/20 0538 07/13/20 1055 07/14/20 0514 07/15/20 0517 07/16/20 0506  NA 140 140 142 139 136  K 5.0 4.9 4.7 5.2* 5.4*  CL 111 110 109 110 105  CO2 19* 20* 21* 19* 24  GLUCOSE 98 119* 120* 138* 162*  BUN 56* 52* 52* 47* 39*  CREATININE 1.07* 1.13* 1.20* 1.17* 1.08*  CALCIUM 8.0* 8.1* 8.2* 7.9* 8.2*  MG 2.2  --  2.0 2.0 1.9  PHOS 3.7  --  3.0 2.7 3.4   GFR: Estimated Creatinine Clearance: 57.9 mL/min (A) (by C-G  formula based on SCr of 1.08 mg/dL (H)). Liver Function Tests: Recent Labs  Lab 07/15/20 0517 07/16/20 0506  AST 45* 33  ALT 67* 53*  ALKPHOS 210* 199*  BILITOT 0.7 0.9  PROT 5.3* 5.7*  ALBUMIN 1.5* 1.5*   No results for input(s): LIPASE, AMYLASE in the last 168  hours. No results for input(s): AMMONIA in the last 168 hours. Coagulation Profile: Recent Labs  Lab 07/11/20 0523  INR 1.5*   Cardiac Enzymes: No results for input(s): CKTOTAL, CKMB, CKMBINDEX, TROPONINI in the last 168 hours. BNP (last 3 results) No results for input(s): PROBNP in the last 8760 hours. HbA1C: No results for input(s): HGBA1C in the last 72 hours. CBG: Recent Labs  Lab 07/15/20 1942 07/16/20 0026 07/16/20 0306 07/16/20 0803 07/16/20 1259  GLUCAP 194* 102* 106* 127* 98   Lipid Profile: No results for input(s): CHOL, HDL, LDLCALC, TRIG, CHOLHDL, LDLDIRECT in the last 72 hours. Thyroid Function Tests: No results for input(s): TSH, T4TOTAL, FREET4, T3FREE, THYROIDAB in the last 72 hours. Anemia Panel: No results for input(s): VITAMINB12, FOLATE, FERRITIN, TIBC, IRON, RETICCTPCT in the last 72 hours. Sepsis Labs: No results for input(s): PROCALCITON, LATICACIDVEN in the last 168 hours.  Recent Results (from the past 240 hour(s))  Culture, Urine     Status: None   Collection Time: 07/14/20  1:15 PM   Specimen: Urine, Random  Result Value Ref Range Status   Specimen Description URINE, RANDOM  Final   Special Requests NONE  Final   Culture   Final    NO GROWTH Performed at [facility], [address] [place], [place] [address]    Report Status 07/15/2020 FINAL  Final  Culture, blood (routine x 2)     Status: None (Preliminary result)   Collection Time: 07/14/20  2:06 PM   Specimen: BLOOD LEFT ARM  Result Value Ref Range Status   Specimen Description BLOOD LEFT ARM  Final   Special Requests   Final    BOTTLES DRAWN AEROBIC ONLY Blood Culture adequate volume   Culture   Final    NO GROWTH 2 DAYS Performed at [facility], [place] [address], [place], [place] [address]    Report Status PENDING  Incomplete  Culture, blood (routine x 2)     Status: None (Preliminary result)   Collection Time: 07/14/20  2:10 PM   Specimen: BLOOD LEFT HAND   Result Value Ref Range Status   Specimen Description BLOOD LEFT HAND  Final   Special Requests   Final    BOTTLES DRAWN AEROBIC ONLY Blood Culture adequate volume   Culture   Final    NO GROWTH 2 DAYS Performed at [facility], [place] [address], [place], [place] [address]    Report Status PENDING  Incomplete     RN Pressure Injury Documentation:     Estimated body mass index is 19.62 kg/m as calculated from the following:   Height as of this encounter: '5\' 11"'  (1.803 m).   Weight as of this encounter: 63.8 kg.  Malnutrition Type:  Nutrition Problem: Severe Malnutrition Etiology: chronic illness   Malnutrition Characteristics:  Signs/Symptoms: severe muscle depletion, energy intake < or equal to 75% for > or equal to 1 month, moderate fat depletion, edema   Nutrition Interventions:  Interventions: Tube feeding, Prostat   Radiology Studies: DG CHEST PORT 1 VIEW  Result Date: 07/16/2020 CLINICAL DATA:  Respiratory failure. History of rheumatoid lung disease. EXAM: PORTABLE CHEST  1 VIEW COMPARISON:  07/15/2020; 07/14/2020; 07/12/2020 FINDINGS: Grossly unchanged enlarged cardiac silhouette. Normal mediastinal contours. Stable positioning of support apparatus. Improved aeration of the lungs with persistent bilateral slightly nodular interstitial opacities with basilar predominance. No pleural effusion or pneumothorax. No new focal airspace opacities. No acute osseous abnormalities. IMPRESSION: 1. Stable position of support apparatus.  No pneumothorax. 2. Improved aeration of the lungs with minimal residual bilateral interstitial opacities with basilar predominance nonspecific with differential considerations including asymmetric pulmonary edema versus pulmonary fibrosis. Electronically Signed   By: Sandi Mariscal M.D.   On: 07/16/2020 14:17   DG CHEST PORT 1 VIEW  Result Date: 07/15/2020 CLINICAL DATA:  Shortness of breath. EXAM: PORTABLE CHEST 1 VIEW COMPARISON:  07/14/2020.   07/12/2020.  06/23/2020. FINDINGS: Tracheostomy tube, feeding tube, right PICC line in stable position. Stable cardiomegaly. Diffuse severe bilateral interstitial prominence again noted. Interim slight increase interstitial markings noted on today's exam suggesting a component of interstitial edema. Tiny left pleural effusion cannot be excluded on today's exam. No pneumothorax. IMPRESSION: 1. Lines and tubes in stable position. 2. Stable cardiomegaly. 3. Diffuse severe bilateral interstitial prominence again noted. Interim slight increase in interstitial markings noted on today's exam suggesting the possibility of a component of interstitial edema. Tiny left pleural effusion cannot be excluded on today's exam. Electronically Signed   By: Marcello Moores  Register   On: 07/15/2020 07:27   Scheduled Meds:  amiodarone  200 mg Per Tube Daily   chlorhexidine gluconate (MEDLINE KIT)  15 mL Mouth Rinse BID   Chlorhexidine Gluconate Cloth  6 each Topical Q0600   feeding supplement (PROSource TF)  45 mL Per Tube Daily   free water  150 mL Per Tube Q4H   furosemide  20 mg Intravenous Daily   influenza vac split quadrivalent PF  0.5 mL Intramuscular Tomorrow-1000   insulin aspart  0-15 Units Subcutaneous Q4H   insulin aspart  3 Units Subcutaneous Q4H   ipratropium  0.5 mg Nebulization TID   lactulose  30 g Per Tube Daily   levalbuterol  0.63 mg Nebulization TID   levETIRAcetam  500 mg Per Tube BID   mouth rinse  15 mL Mouth Rinse 10 times per day   pantoprazole sodium  40 mg Per Tube Daily   predniSONE  40 mg Per Tube Q breakfast   silver sulfADIAZINE   Topical BID   sodium chloride flush  10-40 mL Intracatheter Q12H   Continuous Infusions:  sodium chloride Stopped (07/13/20 0007)   dextrose 25 mL/hr at 07/15/20 2246   feeding supplement (OSMOLITE 1.5 CAL) 1,000 mL (07/16/20 0045)   heparin 1,350 Units/hr (07/16/20 1351)   meropenem (MERREM) IV 2 g (07/16/20 1527)    LOS: 24 days    Kerney Elbe, DO [facility] PAGER is on AMION  If 7PM-7AM, please contact night-coverage [email or web address]

## 2020-07-16 NOTE — Evaluation (Signed)
Physical Therapy Evaluation Patient Details Name: Carolyn Proctor MRN: [id number] DOB: October 14, 1960 Today's Date: 07/16/2020   History of Present Illness  Patient is a 59 year old female. She presented to the hospital with reports of stroke like sympotms. She had a complicated hospital dourase which included intubation for a few weeks. She is currently on a trach. PMH: lupus, a-fib, CHF, tachycardia, thrombocytoisis, right ventricular dysfucntion, rhematoid lung disease; RA   Clinical Impression  Patient was limited by HR and syncope with movement today. She was very agreeable to work with therapy. She was able to come to a sitting position in the bed using the chair function on the bed. She was able to sit forward off the bed with mod a +2 of therapy and max a to maintain. She became syncopal and her HR elevated in sitting position. She appears motivated to participate. She is able to communicate with head nods 2nd to trach. She would benefit from further skilled acute therapy and continued PT if LTACH remains her plan.     Follow Up Recommendations LTACH    Equipment Recommendations  Rolling walker with 5" wheels    Recommendations for Other Services Rehab consult     Precautions / Restrictions Precautions Precautions: Fall Precaution Comments: trach, watch HR Restrictions Weight Bearing Restrictions: No      Mobility  Bed Mobility Overal bed mobility: Needs Assistance Bed Mobility: Supine to Sit     Supine to sit: Total assist;+2 for physical assistance;+2 for safety/equipment     General bed mobility comments: use of bed egress to chair position with +2 assist to bring trunk away from [facility]. deferred EOB attempts today as pt with elevated/fluctuating HR with mobility at bed level and chair egress (briefly up to 140s noted)  Transfers                 General transfer comment: not attmepted today. Patients B/P flucutated in chair position and with light activity. She  reported syncope when sitting away from the bed.   Ambulation/Gait                Stairs            Wheelchair Mobility    Modified Rankin (Stroke Patients Only)       Balance Overall balance assessment: Needs assistance   Sitting balance-Leahy Scale: Zero Sitting balance - Comments: +2 assist for unsupported balance                                      Pertinent Vitals/Pain Pain Assessment: Faces Faces Pain Scale: Hurts a little bit Pain Location: abdomen  Pain Descriptors / Indicators: Discomfort Pain Intervention(s): Monitored during session    Home Living Family/patient expects to be discharged to:: Private residence Living Arrangements:  (unspecified family) Available Help at Discharge: Family Type of Home: House Home Access: Stairs to enter   Technical brewer of Steps:  (just a few ) Home Layout: One level Home Equipment: None      Prior Function Level of Independence: Independent         Comments: Per patient she was able to ambualte and was driving prior to hopsitialzation      Hand Dominance   Dominant Hand: Left    Extremity/Trunk Assessment   Upper Extremity Assessment Upper Extremity Assessment: RUE deficits/detail (Simultaneous filing. User may not have seen previous data.) RUE Deficits /  Details: globally weak throughout, decreased shoulder/scapular ROM, contractures noted at digits especially 2nd and 3rd digit, ?cyanotic fingernails  RUE Coordination: decreased fine motor;decreased gross motor LUE Deficits / Details: globally weak throughout, decreased shoulder/scapular ROM, notable stiffness at wrist and digits, unable to passively flex or extend wrist, very minimal ROM to digits and some are painful with ROM attempts, ?cyanotic fingernails LUE Coordination: decreased fine motor;decreased gross motor    Lower Extremity Assessment Lower Extremity Assessment: Defer to PT evaluation RLE Deficits / Details:  atrophy of the right calf noted compared to left. May be baseline  able to activley extend knee vs garvity ; tight hip flexion  LLE Deficits / Details: tight hip flexion; abdle to extend kee v gravity        Communication   Communication: Tracheostomy  Cognition Arousal/Alertness: Awake/alert Behavior During Therapy: WFL for tasks assessed/performed Overall Cognitive Status: Difficult to assess                                 General Comments: pt answering yes/no questions (appears appropriate), following all simple commands.       General Comments General comments (skin integrity, edema, etc.): right claf atrophy vs left     Exercises     Assessment/Plan    PT Assessment Patient needs continued PT services  PT Problem List Decreased strength;Decreased range of motion;Decreased activity tolerance;Decreased mobility;Decreased balance;Decreased knowledge of use of DME       PT Treatment Interventions Gait training;Functional mobility training;Therapeutic activities;Therapeutic exercise;Neuromuscular re-education;Patient/family education    PT Goals (Current goals can be found in the Care Plan section)  Acute Rehab PT Goals Patient Stated Goal: none stated, agreeable to working with therapies     Frequency Min 2X/week   Barriers to discharge        Co-evaluation PT/OT/SLP Co-Evaluation/Treatment: Yes Reason for Co-Treatment: Complexity of the patient's impairments (multi-system involvement) PT goals addressed during session: Mobility/safety with mobility;Balance;Proper use of DME;Strengthening/ROM OT goals addressed during session: ADL's and self-care       AM-PAC PT "6 Clicks" Mobility  Outcome Measure Help needed turning from your back to your side while in a flat bed without using bedrails?: Total Help needed moving from lying on your back to sitting on the side of a flat bed without using bedrails?: Total Help needed moving to and from a bed to a  chair (including a wheelchair)?: Total Help needed standing up from a chair using your arms (e.g., wheelchair or bedside chair)?: Total Help needed to walk in hospital room?: Total Help needed climbing 3-5 steps with a railing? : Total 6 Click Score: 6    End of Session     Patient left: in bed;with call bell/phone within reach;with bed alarm set Nurse Communication: Mobility status PT Visit Diagnosis: Other abnormalities of gait and mobility (R26.89);Muscle weakness (generalized) (M62.81);Difficulty in walking, not elsewhere classified (R26.2)    Time: 2297-9892 PT Time Calculation (min) (ACUTE ONLY): 24 min   Charges:   PT Evaluation $PT Eval High Complexity: 1 High           Carney Living PT DPT  07/16/2020, 1:01 PM

## 2020-07-16 NOTE — Evaluation (Addendum)
[facility] Speaking Valve - Evaluation Patient Details  Name: Carolyn Proctor MRN: [id number] Date of Birth: June 11, 1961  Today's Date: 07/16/2020 Time: 0174-9449 SLP Time Calculation (min) (ACUTE ONLY): 12 min  Past Medical History:  Past Medical History:  Diagnosis Date  . Acute myopericarditis 02/24/2013  . Atrial fibrillation ([place])   . Atrial fibrillation with RVR ([place]) 02/22/2013  . Chronic anticoagulation, Xarelto 04/02/2013  . Chronic diastolic CHF (congestive heart failure) ([place]) 06/26/2019  . Diverticulitis   . Elevated troponin- secondary to Myopericarditis 02/25/2013  . Emphysema  07/07/2019  . Hypertension   . Lupus ([place]) 09/14/2011  . Multifocal atrial tachycardia ([place]) 02/22/2013  . PAF (paroxysmal atrial fibrillation) ([place]) 04/02/2013  . Pleuritic chest pain 02/22/2013  . 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 Rheumatoid arthritis ([place]) 07/07/2019  . Rheumatoid lung disease ([place]) 06/23/2019  . Right ventricular dysfunction 08/05/2019  . Thrombocytosis ([place]) 09/14/2011   Past Surgical History:  Past Surgical History:  Procedure Laterality Date  . BONE MARROW BIOPSY  2010  . CARDIAC CATHETERIZATION    . LEFT HEART CATHETERIZATION WITH CORONARY ANGIOGRAM N/A 02/24/2013   Procedure: LEFT HEART CATHETERIZATION WITH CORONARY ANGIOGRAM;  Surgeon: Leonie Man, MD;  Location: [facility] CATH LAB;  Service: Cardiovascular;  Laterality: N/A;  . RIGHT/LEFT HEART CATH AND CORONARY ANGIOGRAPHY N/A 09/09/2019   Procedure: RIGHT/LEFT HEART CATH AND CORONARY ANGIOGRAPHY;  Surgeon: Jettie Booze, MD;  Location: [facility] CV LAB;  Service: Cardiovascular;  Laterality: N/A;   HPI:  Patient is a 59 year old female. She presented to the hospital with reports of stroke like symptoms. She has had a complicated hospital stay which included intubation from  9/15-10/4 with trach placed on 10/4.  PMH: lupus, a-fib, CHF, tachycardia, thrombocytosis, right ventricular dysfunction, rheumatoid lung disease, RA.   Assessment / Plan / Recommendation Clinical Impression  Pt was seen for a speaking valve evaluation in the setting of aphonia secondary to trach.  Pt was encountered lethargic in bed with daughter at bedside.  She has a Nurse, children's 6 mm cuffed trach and cuff was deflated upon arrival.  Tachycardia was noted at baseline and the pt's HR, RR, and SpO2 were at 115, 25, and 99% respectively prior to [employer] Valve trials.  Finger occlusion was attempted; however, the pt was unable to coordinate phonation with finger occlusion despite cues.  PMV was placed for 3-5 seconds across two trials.  Patient appeared to be uncomfortable during brief trials and nodded "yes" when asked if she would like it removed.  Vitals remained stable, but significant back pressure was observed following trials, with concerns for airway patency.  When PMV was donned, a low volume, hoarse, gurgling sound was observed x1, but clear phonation was not observed.   PMV was cleaned, placed in the purple cup, and left in the pt's room.  Recommend PMV use ONLY with speech therapy at this time.  Signs were hung above the pt's bed regarding PMV precautions and recommendations.    SLP Visit Diagnosis: Aphonia (R49.1)    SLP Assessment  Patient needs continued Speech Lanaguage Pathology Services    Follow Up Recommendations  LTACH;Skilled Nursing facility    Frequency and Duration min 2x/week  2 weeks    PMSV Trial PMSV was placed for: 5-8 seconds  Able to redirect subglottic air through  upper airway: Yes (very minimally ) Able to Attain Phonation: No Voice Quality: Low vocal intensity;Other (comment) (gurgling sound observed ) Able to Expectorate Secretions: No attempts Breath Support for Phonation: Mildly decreased Intelligibility: Not tested Respirations During Trial:  25 SpO2 During Trial: 99 % Pulse During Trial: 115 Behavior: Lethargic   Tracheostomy Tube       Vent Dependency  FiO2 (%): 28 %    Cuff Deflation Trial  GO          Carolyn Proctor 07/16/2020, 3:48 PM

## 2020-07-16 NOTE — Progress Notes (Signed)
SLP Cancellation Note  Patient Details Name: Carolyn Proctor MRN: [id number] DOB: 1960-11-22   Cancelled treatment:       Reason Eval/Treat Not Completed: Fatigue/lethargy limiting ability to participate.  Pt was unable to participate in BSE following PMV evaluation secondary to fatigue.  SLP will f/u as appropriate.     Elvia Collum Yurika Pereda 07/16/2020, 3:58 PM

## 2020-07-16 NOTE — Progress Notes (Signed)
ANTICOAGULATION CONSULT NOTE - Follow Up Consult  Pharmacy Consult for Heparin Indication: Protein S defic, hypercoag w/ recent LE DVT and PE (03/2020) + PAF  No Known Allergies  Patient Measurements: Height: 5\' 11"  (180.3 cm) Weight: 63.8 kg (140 lb 10.5 oz) IBW/kg (Calculated) : 70.8 Heparin Dosing Weight: 63.8 kg  Vital Signs: Temp: 98.4 F (36.9 C) (10/09 0804) Temp Source: Oral (10/09 0804) BP: 125/76 (10/09 0804) Pulse Rate: 98 (10/09 0804)  Labs: Recent Labs    07/13/20 1055 07/13/20 1055 07/13/20 1943 07/14/20 0514 07/14/20 0514 07/14/20 1406 07/14/20 1406 07/14/20 2001 07/15/20 0517 07/15/20 0820 07/15/20 1404 07/16/20 0506  HGB 12.2   < >  --  11.2*   < >  --   --   --  11.0*  --   --  10.8*  HCT 38.6   < >  --  35.9*  --   --   --   --  35.3*  --   --  35.1*  PLT 391   < >  --  367  --   --   --   --  314  --   --  373  APTT 64*  --  37*  --   --  85*  --   --   --   --   --   --   HEPARINUNFRC 0.41   < >  --   --   --  0.46   < > 0.55 0.38  --   --  0.30  CREATININE 1.13*   < >  --  1.20*  --   --   --   --  1.17*  --   --  1.08*  TROPONINIHS  --   --   --   --   --   --   --   --   --  103* 86*  --    < > = values in this interval not displayed.    Estimated Creatinine Clearance: 57.9 mL/min (A) (by C-G formula based on SCr of 1.08 mg/dL (H)).   Assessment: Anticoag: Eliquis PTA for hx Protein S defic, hypercoag w/ recent LE DVT and PE (03/2020) + PAF. CTA neg for PE. Apix held 10/1 over w/end for planned trach 10/4 - to start Hep bridge s/p trach, if stable - can go back to Apix - HL 0.3. Hgb 10.8 stable.   Goal of Therapy:  Heparin level 0.3-0.7 units/ml Monitor platelets by anticoagulation protocol: Yes   Plan:  - Increase IV heparin slightly to 1350 units/hr - Daily HL and BC - Apixaban on hold since 10/1 PM for trach. F/u restart    Keyara Ent S. Alford Highland, PharmD, BCPS Clinical Staff Pharmacist [email or web address] Alford Highland, [employer] 07/16/2020,9:55 AM

## 2020-07-17 ENCOUNTER — Inpatient Hospital Stay (HOSPITAL_COMMUNITY): Payer: BC Managed Care – PPO

## 2020-07-17 DIAGNOSIS — I272 Pulmonary hypertension, unspecified: Secondary | ICD-10-CM | POA: Diagnosis not present

## 2020-07-17 DIAGNOSIS — I50810 Right heart failure, unspecified: Secondary | ICD-10-CM | POA: Diagnosis not present

## 2020-07-17 DIAGNOSIS — R570 Cardiogenic shock: Secondary | ICD-10-CM | POA: Diagnosis not present

## 2020-07-17 DIAGNOSIS — G9340 Encephalopathy, unspecified: Secondary | ICD-10-CM | POA: Diagnosis not present

## 2020-07-17 LAB — CBC WITH DIFFERENTIAL/PLATELET
Abs Immature Granulocytes: 1.39 10*3/uL — ABNORMAL HIGH (ref 0.00–0.07)
Basophils Absolute: 0.1 10*3/uL (ref 0.0–0.1)
Basophils Relative: 1 %
Eosinophils Absolute: 0 10*3/uL (ref 0.0–0.5)
Eosinophils Relative: 0 %
HCT: 32.3 % — ABNORMAL LOW (ref 36.0–46.0)
Hemoglobin: 9.8 g/dL — ABNORMAL LOW (ref 12.0–15.0)
Immature Granulocytes: 9 %
Lymphocytes Relative: 2 %
Lymphs Abs: 0.4 10*3/uL — ABNORMAL LOW (ref 0.7–4.0)
MCH: 32.2 pg (ref 26.0–34.0)
MCHC: 30.3 g/dL (ref 30.0–36.0)
MCV: 106.3 fL — ABNORMAL HIGH (ref 80.0–100.0)
Monocytes Absolute: 1.1 10*3/uL — ABNORMAL HIGH (ref 0.1–1.0)
Monocytes Relative: 8 %
Neutro Abs: 11.9 10*3/uL — ABNORMAL HIGH (ref 1.7–7.7)
Neutrophils Relative %: 80 %
Platelets: 425 10*3/uL — ABNORMAL HIGH (ref 150–400)
RBC: 3.04 MIL/uL — ABNORMAL LOW (ref 3.87–5.11)
RDW: 19.6 % — ABNORMAL HIGH (ref 11.5–15.5)
WBC: 14.8 10*3/uL — ABNORMAL HIGH (ref 4.0–10.5)
nRBC: 0.8 % — ABNORMAL HIGH (ref 0.0–0.2)

## 2020-07-17 LAB — MAGNESIUM: Magnesium: 2 mg/dL (ref 1.7–2.4)

## 2020-07-17 LAB — GLUCOSE, CAPILLARY
Glucose-Capillary: 101 mg/dL — ABNORMAL HIGH (ref 70–99)
Glucose-Capillary: 113 mg/dL — ABNORMAL HIGH (ref 70–99)
Glucose-Capillary: 151 mg/dL — ABNORMAL HIGH (ref 70–99)
Glucose-Capillary: 83 mg/dL (ref 70–99)
Glucose-Capillary: 86 mg/dL (ref 70–99)
Glucose-Capillary: 92 mg/dL (ref 70–99)

## 2020-07-17 LAB — COMPREHENSIVE METABOLIC PANEL
ALT: 53 U/L — ABNORMAL HIGH (ref 0–44)
AST: 36 U/L (ref 15–41)
Albumin: 1.4 g/dL — ABNORMAL LOW (ref 3.5–5.0)
Alkaline Phosphatase: 205 U/L — ABNORMAL HIGH (ref 38–126)
Anion gap: 9 (ref 5–15)
BUN: 39 mg/dL — ABNORMAL HIGH (ref 6–20)
CO2: 23 mmol/L (ref 22–32)
Calcium: 7.7 mg/dL — ABNORMAL LOW (ref 8.9–10.3)
Chloride: 96 mmol/L — ABNORMAL LOW (ref 98–111)
Creatinine, Ser: 1.1 mg/dL — ABNORMAL HIGH (ref 0.44–1.00)
GFR, Estimated: 56 mL/min — ABNORMAL LOW (ref >60)
Glucose, Bld: 257 mg/dL — ABNORMAL HIGH (ref 70–99)
Potassium: 5.1 mmol/L (ref 3.5–5.1)
Sodium: 128 mmol/L — ABNORMAL LOW (ref 135–145)
Total Bilirubin: 0.8 mg/dL (ref 0.3–1.2)
Total Protein: 5 g/dL — ABNORMAL LOW (ref 6.5–8.1)

## 2020-07-17 LAB — HEPARIN LEVEL (UNFRACTIONATED)
Heparin Unfractionated: 1.98 IU/mL — ABNORMAL HIGH (ref 0.30–0.70)
Heparin Unfractionated: 0.25 IU/mL — ABNORMAL LOW (ref 0.30–0.70)

## 2020-07-17 LAB — PHOSPHORUS: Phosphorus: 3.1 mg/dL (ref 2.5–4.6)

## 2020-07-17 MED ORDER — IPRATROPIUM BROMIDE 0.02 % IN SOLN: 0.5000 mg | Freq: Four times a day (QID) | RESPIRATORY_TRACT | Status: DC | PRN

## 2020-07-17 MED ORDER — LEVALBUTEROL HCL 0.63 MG/3ML IN NEBU: 0.6300 mg | INHALATION_SOLUTION | Freq: Four times a day (QID) | RESPIRATORY_TRACT | Status: DC | PRN

## 2020-07-17 NOTE — Progress Notes (Signed)
ANTICOAGULATION CONSULT NOTE - Follow Up Consult  Pharmacy Consult for Heparin + Merrem Indication: Protein S defic, hypercoag w/ recent LE DVT and PE (03/2020) + PAF - ESBL PNA.  No Known Allergies  Patient Measurements: Height: 5\' 11"  (180.3 cm) Weight: 65.2 kg (143 lb 11.8 oz) IBW/kg (Calculated) : 70.8 Heparin Dosing Weight: 63.8 kg  Vital Signs: Temp: 98.9 F (37.2 C) (10/10 0754) Temp Source: Axillary (10/10 0754) BP: 114/63 (10/10 0754) Pulse Rate: 90 (10/10 0754)  Labs: Recent Labs    07/14/20 1406 07/14/20 2001 07/15/20 0517 07/15/20 0517 07/15/20 0820 07/15/20 1404 07/16/20 0506 07/17/20 0500 07/17/20 0758  HGB  --   --  11.0*   < >  --   --  10.8* 9.8*  --   HCT  --   --  35.3*  --   --   --  35.1* 32.3*  --   PLT  --   --  314  --   --   --  373 425*  --   APTT 85*  --   --   --   --   --   --   --   --   HEPARINUNFRC 0.46   < > 0.38   < >  --   --  0.30 1.98* 0.25*  CREATININE  --   --  1.17*  --   --   --  1.08* 1.10*  --   TROPONINIHS  --   --   --   --  103* 86*  --   --   --    < > = values in this interval not displayed.    Estimated Creatinine Clearance: 58.1 mL/min (A) (by C-G formula based on SCr of 1.1 mg/dL (H)).   Assessment: Anticoag: Eliquis PTA for hx Protein S defic, hypercoag w/ recent LE DVT and PE (03/2020) + PAF. CTA neg for PE. Apix held 10/1 over w/end for planned trach 10/4 - to start Hep bridge s/p trach, if stable - can go back to Apix - HL 1.98 erroneous> 0.25 on recheck slightly low. Hgb 10.8>>9.8 large drop.  ID: s/p Mero for ESBL PNA. Afebrile, WBC down 14.8  Vanc 9/15 x 1; restart 9/16 >> 9/17; 9/27> Zosyn 9/15 x 1; restart 9/16 >> 9/20 Cefepime 9/27>9/29 Meropenem 9/29>10/5, 10/7>>   9/15 BCx >> 2/4 Strep mitis/oralis (likely contaminant) 9/15 UCx >> 100k col ESBL Ecoli (s-zosyn) 9/16 RCx (trach aspirate) - negative 9/16 MRSA- negative 9/26: Bcx : neg 9/26: Rcx: ESBL E.coli   Goal of Therapy:  Heparin level  0.3-0.7 units/ml Monitor platelets by anticoagulation protocol: Yes   Plan:  - Increase IV heparin to 1450 units/hr - Daily HL and BC - Apixaban on hold since 10/1 PM for trach. F/u restart  -Meropenem 2gm IV q8h    Talicia Sui S. Alford Highland, PharmD, BCPS Clinical Staff Pharmacist [email or web address] Alford Highland, [employer] 07/17/2020,8:50 AM

## 2020-07-17 NOTE — Progress Notes (Signed)
Wound care DSG change on bilateral LE completed per order

## 2020-07-17 NOTE — Progress Notes (Signed)
PROGRESS NOTE    DARYN PISANI  LHT:[id number] DOB: Sep 03, 1961 DOA: 06/22/2020 PCP: Lilian Coma., MD   Brief Narrative:  Patient is a 59 year old chronically ill-appearing African-American female with a past medical history significant for proximal atrial fibrillation, history of DVT, history of PE anticoagulated Eliquis, history of heart failure with preserved ejection fraction, history of CKD stage III, history of rheumatoid arthritis, hypercoagulable state including increased protein S antigen, decreased protein C activity and decreased Antithrombin III, history of centrilobular emphysema and pulmonary fibrosis, history of chronic venous insufficiency with chronic foot ulcers, history of diverticulitis, history of mixed connective tissue disease and other comorbidities who felt like she was having a stroke and then developed a left facial droop and went unresponsive.  She subsequently required bag valve mask by EMS and was noted to be hypoglycemic and hypotensive.  She was intubated in the ED for airway protection and head CT was done which was negative.  And because she had refractory hypoglycemia despite multiple D 50 a.m. she was requiring a drip.  PCCM was called to admit for this patient.  She is admitted on 06/22/2020 and initiated on pulse dose steroids and diuresis given that she was in cardiogenic shock.  On 07/02/2020 she was on full ventilatory support and was encephalopathic precluding extubation.  926 she is but not weaning and encephalopathic with dysautonomia and off amiodarone drip and subsequently tracheostomy was placed on 07/11/2020.  Further work-up revealed that she had acute hypoxic respiratory failure with inability to protect her airway in the setting of hypoglycemia and subsequently developing HCAP with an ESBL E. coli and she has interstitial lung disease with connective tissue disease with acute pneumonitis and pulmonary edema.  She was also found to be subsequently an acute  systolic CHF and advanced heart failure team was consulted.  During her hospitalization she also developed atrial flutter with atrial tachycardia runs of nonsustained ventricular tachycardia.  Subsequently she has been placed on many antibiotics and escalated to meropenem given her ESBL.  She has now been on trach collar and she is transferred to [facility] service on 07/14/2020 after a prolonged ICU stay.  Today she spiked a temperature of 102 and became tachycardic and tachypneic.  We will panculture her again and resume antibiotics with IV meropenem. Pulmonary still will be following for trach care.  07/15/2020: She developed emesis this morning as well as chest pain and was given antimedics.  Pulmonary was reconsulted given her worsening respiratory status but she is stabilized.  Family is interested in [place] at [place].  07/16/2020: Temperature curve is improving.  PT OT recommending LTACH.  Also get speech therapy to see the patient cannot use PMV with speech therapy.  SLP not evaluated given fatigue.  E[facility] Love status appears stable compared to yesterday.  07/17/2020: Her temperature has resolved and her WBC was daily.  Respiratory status is relatively stable and she is not as rhonchorous.  Patient thinks that she is doing better today and will continue SLP efforts.  Renal function remains stable but her sodium did drop slightly likely in setting of diuretics.  We will also need to continue to monitor hemoglobin hematocrit given that she is anticoagulated with heparin drip.  Assessment & Plan:   Active Problems:   Pulmonary HTN (HCC)   Acute encephalopathy   Acute respiratory failure (HCC)   Oxygen desaturation   Cardiogenic shock (HCC)   Acute renal failure with tubular necrosis (HCC)   RVF (right ventricular failure) ([place])  E. 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 - patient doing well on minimal ventilation settings, trach collar trial - completed meropenem abx treatment because she spiked another temperature will resume for now and likely prolonged treatment and continue for 14 days total; WBC is improving daily and it is now 14.8 - continue prednisone 40 mg daily for acute pneumonitis in setting of ILD, consider slow steroid taper - Follow CXR -Mains on trach collar now -SpO2: 100 % O2 Flow Rate (L/min): 5 L/min FiO2 (%): 28 %; she was on 8 L yesterday and is improving -Repeat chest x-ray this AM pending formal read. and repeat chest x-ray today showed "Stable position of support apparatus.  No pneumothorax. Improved aeration of the lungs with minimal residual bilateral interstitial opacities with basilar predominance nonspecific with differential considerations including asymmetric pulmonary edema versus pulmonary fibrosis." -Dr. Sharma Covert discussed with her outpatient rheumatologist and they feel that this is a vasculitis picture and given her underlying infections and they cannot use more aggressive immunosuppression other than prednisone at this time -Continue with breathing treatment and continue with Xopenex/Atrovent -Patient had a emesis episode and likely aspirated PCCM was reconsulted -Recommending continuing ATC as the patient was currently saturating 100% on 20% and wean FiO2 as able.  They are recommending routine trach care as well as as needed antiemetics and if emesis recurs they are recommending to see if Cortrak can  -Will consult PT/OT/SLP and OOB to Mobilize as Able; they are recommending LTAC -Will follow CXR in AM and repeat ABG intermittently  -Recommend that if she has further respiratory complications to reconsult and call again. -LTAC looking at the patient and [facility] is the preference;  appreciate case management assistance in placement of an LTAC  Fever and SIRS, improving  -Temperature of 102 on 07/24/20 and was tachycardic and tachypneic -Currently no clear source of infection and she had just completed treatment for HCAP with ESBL E Coli but this is likely the source and she will likely need a prolonged antibiotic course totaling 14 days per PCCM recommendations -likely this is in the setting of her HCAP pneumonia -When she did spike her temperature a few days ago she was pancultured -chest x-ray was as above  -urinalysis showed a cloudy appearance with large hemoglobin, large leukocytes, rare bacteria, greater than 50 RBCs per high-power field, greater than 50 WBCs but urine culture showed no growth  -Blood cultures x2 showed NGTD at 2 Days -Resumed IV meropenem and will continue for total 14 days -follow cultures and data monitor temperature curve and WBC  -WBC remains elevated but is now trending down to 16.8 -> 15.4 -> 14.8  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 Ephriam Jenkins with [facility]). - previously treated with rituximab >> last in November 2020 - defer further immunosuppressive therapy while she is being treated for infection -She will need close rheumatology follow-up at discharge.   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 Atrial flutter/atrial tachycardia with runs of NSVT - new this admission. - advanced heart failure team following intermittently; initially she was in cardiogenic shock due to right ventricular failure/cor pulmonale and  echo was repeated and she is improved with milrinone and diuresis -Sequently developed septic shock with ESBL E. coli and was changed from cefepime to meropenem - continue amiodarone 200 mg daily -Cardiology recommending that she needs a right heart catheter currently not a candidate and  they are not to pursue this on Friday and Dr. Haroldine Laws feels that she needs to be more stable before even attempts of right heart cath -Patient is -2.385 L since admission: She has been -2 L since yesterday -Continues to get IV Lasix 20 mg daily with Dr. Haroldine Laws is going to consider changing to torsemide when she is ready to change to p.o.;  Acute metabolic encephalopathy from hypoglycemia, hypotension, hypoxia. Seizure in setting of hypoxia with respiratory failure. -continue Keppra -Delirium precautions -Brain MRI with no evidence of vasculitis  -continue with steroids prednisone 40 daily  Pulmonary Embolism in June 2021. -  Continue heparin gtt, consider bridging back to eliquis as she is diagnosed back in June of this year  Bilateral Pressure Ulcers - Wound Consult - BID Dressing Change w/ silvadene  Hypernatremia (Resolved) she is now on hypernatremic -Free water not been discontinued, D5W at 25 mL's per hour will now be discontinued -Sodium is now 139 and trending down slowly went to 136 yesterday and today is 128 -We will stop D5W and may need to watch carefully given that she is on Lasix 10 mg -Continue monitor carefully and repeat CMP in the a.m.  Hyperkalemia -Mild and slightly worsened yesterday but is improved to 5.1 today -In the setting of IV potassium replacement -Potassium was 5.2 -> 5.4 -> 5.1 -She was given Lokelma 10 mg x1 on 07/16/20 -Likely to improve in the setting of diuresis -Continue to monitor and trend repeat CMP in the a.m.  Liver cirrhosis Abnormal LFTs, mildly elevated on last check - f/u LFTs intermittently and will repeat CMP in the a.m; LFTs are improving and AST is now 36 and ALT remains slightly elevated at 53 -We will continue monitor and trend -continue lactulose, titrate~200 mL of stool per rectal tube  Steroid Induced Hyperglycemia. - SSI with moderate NovoLog sliding scale insulin every 4 hours as well as 3 units subcu every 4  hours scheduled -CBGs ranging from 72-124  Nausea and Emesis, improved -Likely aspirated but will continue with antiemetics and started on IV ondansetron -No further emesis now -C/w Supportive Care  Macrocytic Anemia -Patient's Hgb/Hct went from 11.2/35.9 -> 11.0/35.3 -> 10.8/35.1 and is slowly trending downward to 9.8/32.3 -Check Anemia Panel in the AM -Continue to Monitor for S/Sx of Bleeding; No overt bleeding noted -Repeat CBC in the AM   Chest Pain, rule out ACS  -Chest pain this morning -EKG was relatively unremarkable and nonischemic; -Troponin was initially elevated likely is demand ischemia in the setting of her nausea vomiting and her current comorbidities -Troponin levels 103 and trended down to 86 -Added nitroglycerin, morphine and she has chest pain now when she coughs  Thrombocytosis -Likely reactive -Patient's platelet count from 373 is now 425 -Continue to monitor and trend and repeat CBC in a.m.  Severe Protein Calorie Malnutrition. -Nutritionist consulted and she is on Tube feeds with Osmolite 1.5 at 60 mils per hour, 45 mill liters of Prosource tube feeding daily, 150 MLof free water every 4  DVT prophylaxis: Anticoagulated with a heparin drip Code Status: FULL CODE  Family Communication: Discussed with Aunt at bedside  Disposition Plan: Pending further improvement of her clinical status and resolution of her fevers.  Will need clearance by pulmonary as well as cardiology and may require an inpatient right heart cath if she improves  Status is: Inpatient  Remains inpatient appropriate because:Ongoing diagnostic testing needed not appropriate for outpatient work up, Unsafe d/c plan, IV treatments appropriate due to intensity of illness or inability to take PO and Inpatient level of care appropriate due to severity of illness   Dispo: The patient is from: Home              Anticipated d/c is to: LTAC              Anticipated d/c date is: > 3 days               Patient currently is not medically stable to d/c.  Consultants:   PCCM Transfer  Cardiology  Neurology  Procedures:  9/15 ETT >> 9/15 R IJ TL CVC >>9/27 9/27 PICC >  10/4 Trach  Significant Diagnostic Tests:   9/15 CTH >> negative  9/15 CTA chest/ abd/ pelvis >> significant dilatation of RA and RV consistent with R heart failure; moderate R and small L pleural effusions with associated atelectasis of both lower lobes; underlying chronic cystic and fibrotic lung disease; severe anasarca; nodular contour of liver suggestive of possible cirrhosis  9/15 Echo> LVEF 35-40%, mod decreased LV function, RVSP 75, severely reduced RV systolic function; R to left shunt noted, suggestive of PFO, RA severely dilated, mod pericardial effusion  9/15 CTA head and neck >> no significant carotid or vertebral artery stenosis in neck, no LVO, right jugular central venous catheter with probable surrounding hematoma  9/18 MR BRAIN>> no acute intracranial abnormality  9/28 MRI brain > diffuse pachymeningeal enhancement, bilateral cerebral convexities, sub centimeter extra axial focus of T1 hyperintensity along R temporal occiptal lobe, new  Micro Data:  9/15 BCx2 central line >> NG5D 9/15 BCx2 PIV >> Strept mitis 9/15 SARS2 >> negative 9/15 UC >> ESBL E coli 9/16 Trach asp cult >> Normal flora 9/27 blood > negative 9/27 resp > e coli ESBL  Antimicrobials:  9/15 vanc >> 9/17 9/15 zosyn >> 9/20  9/27 vanc > 9/28 9/27 cefepime > 9/29 9/29 meropenem > 10/5 and resumed on 10/6 >  Antimicrobials:  Anti-infectives (From admission, onward)   Start     Dose/Rate Route Frequency Ordered Stop   07/14/20 1400  meropenem (MERREM) 2 g in sodium chloride 0.9 % 100 mL IVPB        2 g 200 mL/hr over 30 Minutes Intravenous Every 8 hours 07/14/20 1339     07/06/20 1400  meropenem (MERREM) 2 g in sodium chloride 0.9 % 100 mL IVPB        2 g 200 mL/hr over 30 Minutes Intravenous Every 8 hours  07/06/20 1124 07/12/20 2230   07/05/20 1000  vancomycin (VANCOREADY) IVPB 500 mg/100 mL  Status:  Discontinued        500 mg 100 mL/hr over 60 Minutes Intravenous Every 12 hours 07/04/20 2127 07/05/20 0902   07/04/20 2200  ceFEPIme (MAXIPIME) 2 g in sodium chloride 0.9 % 100 mL IVPB  Status:  Discontinued        2 g 200 mL/hr over 30 Minutes Intravenous Every 12 hours 07/04/20 0929 07/06/20 1124   07/04/20 2200  vancomycin (VANCOREADY) IVPB 500 mg/100 mL  Status:  Discontinued        500 mg 100 mL/hr over 60 Minutes Intravenous Every 12 hours 07/04/20 1149 07/04/20 2127   07/04/20  2200  vancomycin (VANCOCIN) IVPB 1000 mg/200 mL premix        1,000 mg 200 mL/hr over 60 Minutes Intravenous  Once 07/04/20 2127 07/05/20 0032   07/04/20 0930  vancomycin (VANCOCIN) IVPB 1000 mg/200 mL premix  Status:  Discontinued        1,000 mg 200 mL/hr over 60 Minutes Intravenous  Once 07/04/20 0929 07/04/20 2127   07/04/20 0930  vancomycin (VANCOREADY) IVPB 500 mg/100 mL  Status:  Discontinued        500 mg 100 mL/hr over 60 Minutes Intravenous Every 12 hours 07/04/20 0929 07/04/20 1149   06/23/20 1015  vancomycin (VANCOREADY) IVPB 750 mg/150 mL  Status:  Discontinued        750 mg 150 mL/hr over 60 Minutes Intravenous Every 12 hours 06/23/20 1007 06/24/20 0849   06/23/20 1000  piperacillin-tazobactam (ZOSYN) IVPB 3.375 g        3.375 g 12.5 mL/hr over 240 Minutes Intravenous Every 8 hours 06/23/20 0948 06/28/20 0805   06/22/20 1230  vancomycin (VANCOREADY) IVPB 1250 mg/250 mL        1,250 mg 166.7 mL/hr over 90 Minutes Intravenous  Once 06/22/20 1226 06/22/20 1535   06/22/20 1230  piperacillin-tazobactam (ZOSYN) IVPB 3.375 g        3.375 g 100 mL/hr over 30 Minutes Intravenous  Once 06/22/20 1226 06/22/20 1502       Subjective: Seen and examined at bedside and and was more awake and alert today.  Denies any shortness of breath but still having some mild chest pain and feels is worse when she  coughs.  Denies any nausea or vomiting.  Worked with SLP yesterday.  Feels better.  No other concerns or complaints at this time and more interactive today.  Objective: Vitals:   07/17/20 0754 07/17/20 0801 07/17/20 1145 07/17/20 1206  BP: 114/63   125/78  Pulse: 90  100 99  Resp: (!) 25  (!) 25 (!) 26  Temp: 98.9 F (37.2 C)   98.8 F (37.1 C)  TempSrc: Axillary   Axillary  SpO2: 100% 100% 100% 100%  Weight:      Height:        Intake/Output Summary (Last 24 hours) at 07/17/2020 1212 Last data filed at 07/17/2020 1144 Gross per 24 hour  Intake --  Output 4600 ml  Net -4600 ml   Filed Weights   07/15/20 0414 07/16/20 0455 07/17/20 0357  Weight: 63.9 kg 63.8 kg 65.2 kg   Examination: Physical Exam:  Constitutional: Patient is a thin chronically ill-appearing African-American female currently who is calmer today compared to yesterday and still slightly uncomfortable Eyes: Lids and conjunctivae normal, sclerae anicteric  ENMT: External Ears, Nose appear normal. Grossly normal hearing. Neck: Has a tracheostomy in place connected to ATC Respiratory: Diminished to auscultation bilaterally with coarse breath sounds and some rhonchi and minimal crackles.  Has a slightly increased respiratory effort and she is mildly tachypneic but not using accessory muscles.  Wearing 5 L connected to ATC Cardiovascular: Mildly tachycardic rate but regular rhythm.  No appreciable murmurs, rubs, gallops.  No appreciable lower extremity edema Abdomen: Soft, non-tender, non-distended. Bowel sounds positive.  GU: Deferred. Musculoskeletal: No clubbing / cyanosis of digits/nails. No joint deformity upper and lower extremities.  Skin: Has bilateral pressure ulcers and her legs are in Prevalon boots.  No induration; Warm and dry.  Neurologic: CN 2-12 grossly intact with no focal deficits.  Romberg sign cerebellar reflexes not assessed.  Psychiatric: Normal  judgment and insight. Alert and oriented x 3.  Normal mood and appropriate affect.   Data Reviewed: I have personally reviewed following labs and imaging studies  CBC: Recent Labs  Lab 07/13/20 1055 07/14/20 0514 07/15/20 0517 07/16/20 0506 07/17/20 0500  WBC 22.7* 21.5* 16.8* 15.4* 14.8*  NEUTROABS  --   --  14.7* 13.3* 11.9*  HGB 12.2 11.2* 11.0* 10.8* 9.8*  HCT 38.6 35.9* 35.3* 35.1* 32.3*  MCV 104.9* 106.5* 106.6* 106.0* 106.3*  PLT 391 367 314 373 338*   Basic Metabolic Panel: Recent Labs  Lab 07/12/20 0538 07/12/20 0538 07/13/20 1055 07/14/20 0514 07/15/20 0517 07/16/20 0506 07/17/20 0500  NA 140   < > 140 142 139 136 128*  K 5.0   < > 4.9 4.7 5.2* 5.4* 5.1  CL 111   < > 110 109 110 105 96*  CO2 19*   < > 20* 21* 19* 24 23  GLUCOSE 98   < > 119* 120* 138* 162* 257*  BUN 56*   < > 52* 52* 47* 39* 39*  CREATININE 1.07*   < > 1.13* 1.20* 1.17* 1.08* 1.10*  CALCIUM 8.0*   < > 8.1* 8.2* 7.9* 8.2* 7.7*  MG 2.2  --   --  2.0 2.0 1.9 2.0  PHOS 3.7  --   --  3.0 2.7 3.4 3.1   < > = values in this interval not displayed.   GFR: Estimated Creatinine Clearance: 58.1 mL/min (A) (by C-G formula based on SCr of 1.1 mg/dL (H)). Liver Function Tests: Recent Labs  Lab 07/15/20 0517 07/16/20 0506 07/17/20 0500  AST 45* 33 36  ALT 67* 53* 53*  ALKPHOS 210* 199* 205*  BILITOT 0.7 0.9 0.8  PROT 5.3* 5.7* 5.0*  ALBUMIN 1.5* 1.5* 1.4*   No results for input(s): LIPASE, AMYLASE in the last 168 hours. No results for input(s): AMMONIA in the last 168 hours. Coagulation Profile: Recent Labs  Lab 07/11/20 0523  INR 1.5*   Cardiac Enzymes: No results for input(s): CKTOTAL, CKMB, CKMBINDEX, TROPONINI in the last 168 hours. BNP (last 3 results) No results for input(s): PROBNP in the last 8760 hours. HbA1C: No results for input(s): HGBA1C in the last 72 hours. CBG: Recent Labs  Lab 07/16/20 2329 07/17/20 0356 07/17/20 0757 07/17/20 0758 07/17/20 1204  GLUCAP 72 113* <10* 86 101*   Lipid Profile: No results  for input(s): CHOL, HDL, LDLCALC, TRIG, CHOLHDL, LDLDIRECT in the last 72 hours. Thyroid Function Tests: No results for input(s): TSH, T4TOTAL, FREET4, T3FREE, THYROIDAB in the last 72 hours. Anemia Panel: No results for input(s): VITAMINB12, FOLATE, FERRITIN, TIBC, IRON, RETICCTPCT in the last 72 hours. Sepsis Labs: No results for input(s): PROCALCITON, LATICACIDVEN in the last 168 hours.  Recent Results (from the past 240 hour(s))  Culture, Urine     Status: None   Collection Time: 07/14/20  1:15 PM   Specimen: Urine, Random  Result Value Ref Range Status   Specimen Description URINE, RANDOM  Final   Special Requests NONE  Final   Culture   Final    NO GROWTH Performed at [facility], [address] [place], [place] [address]    Report Status 07/15/2020 FINAL  Final  Culture, blood (routine x 2)     Status: None (Preliminary result)   Collection Time: 07/14/20  2:06 PM   Specimen: BLOOD LEFT ARM  Result Value Ref Range Status   Specimen Description BLOOD LEFT ARM  Final  Special Requests   Final    BOTTLES DRAWN AEROBIC ONLY Blood Culture adequate volume   Culture   Final    NO GROWTH 2 DAYS Performed at [facility], [place] [address] [place], [place] [address]    Report Status PENDING  Incomplete  Culture, blood (routine x 2)     Status: None (Preliminary result)   Collection Time: 07/14/20  2:10 PM   Specimen: BLOOD LEFT HAND  Result Value Ref Range Status   Specimen Description BLOOD LEFT HAND  Final   Special Requests   Final    BOTTLES DRAWN AEROBIC ONLY Blood Culture adequate volume   Culture   Final    NO GROWTH 2 DAYS Performed at [facility], [place] [address] [place], [place] [address]    Report Status PENDING  Incomplete     RN Pressure Injury Documentation:     Estimated body mass index is 20.05 kg/m as calculated from the following:   Height as of this encounter: '5\' 11"'  (1.803 m).   Weight as of this encounter: 65.2  kg.  Malnutrition Type:  Nutrition Problem: Severe Malnutrition Etiology: chronic illness   Malnutrition Characteristics:  Signs/Symptoms: severe muscle depletion, energy intake < or equal to 75% for > or equal to 1 month, moderate fat depletion, edema   Nutrition Interventions:  Interventions: Tube feeding, Prostat   Radiology Studies: DG CHEST PORT 1 VIEW  Result Date: 07/16/2020 CLINICAL DATA:  Respiratory failure. History of rheumatoid lung disease. EXAM: PORTABLE CHEST 1 VIEW COMPARISON:  07/15/2020; 07/14/2020; 07/12/2020 FINDINGS: Grossly unchanged enlarged cardiac silhouette. Normal mediastinal contours. Stable positioning of support apparatus. Improved aeration of the lungs with persistent bilateral slightly nodular interstitial opacities with basilar predominance. No pleural effusion or pneumothorax. No new focal airspace opacities. No acute osseous abnormalities. IMPRESSION: 1. Stable position of support apparatus.  No pneumothorax. 2. Improved aeration of the lungs with minimal residual bilateral interstitial opacities with basilar predominance nonspecific with differential considerations including asymmetric pulmonary edema versus pulmonary fibrosis. Electronically Signed   By: Sandi Mariscal M.D.   On: 07/16/2020 14:17   Scheduled Meds: . amiodarone  200 mg Per Tube Daily  . chlorhexidine gluconate (MEDLINE KIT)  15 mL Mouth Rinse BID  . Chlorhexidine Gluconate Cloth  6 each Topical Q0600  . feeding supplement (PROSource TF)  45 mL Per Tube Daily  . free water  150 mL Per Tube Q4H  . furosemide  20 mg Intravenous Daily  . influenza vac split quadrivalent PF  0.5 mL Intramuscular Tomorrow-1000  . insulin aspart  0-15 Units Subcutaneous Q4H  . insulin aspart  3 Units Subcutaneous Q4H  . lactulose  30 g Per Tube Daily  . levETIRAcetam  500 mg Per Tube BID  . mouth rinse  15 mL Mouth Rinse 10 times per day  . pantoprazole sodium  40 mg Per Tube Daily  . predniSONE  40 mg  Per Tube Q breakfast  . silver sulfADIAZINE   Topical BID  . sodium chloride flush  10-40 mL Intracatheter Q12H   Continuous Infusions: . sodium chloride Stopped (07/13/20 0007)  . dextrose 25 mL/hr at 07/15/20 2246  . feeding supplement (OSMOLITE 1.5 CAL) 1,000 mL (07/16/20 1729)  . heparin 1,450 Units/hr (07/17/20 0926)  . meropenem (MERREM) IV 2 g (07/17/20 0521)    LOS: 25 days   Kerney Elbe, DO [facility] PAGER is on [place]  If 7PM-7AM, please contact night-coverage [email or web address]

## 2020-07-18 ENCOUNTER — Inpatient Hospital Stay (HOSPITAL_COMMUNITY): Payer: BC Managed Care – PPO

## 2020-07-18 DIAGNOSIS — R570 Cardiogenic shock: Secondary | ICD-10-CM | POA: Diagnosis not present

## 2020-07-18 DIAGNOSIS — J9600 Acute respiratory failure, unspecified whether with hypoxia or hypercapnia: Secondary | ICD-10-CM | POA: Diagnosis not present

## 2020-07-18 DIAGNOSIS — N170 Acute kidney failure with tubular necrosis: Secondary | ICD-10-CM | POA: Diagnosis not present

## 2020-07-18 DIAGNOSIS — G9340 Encephalopathy, unspecified: Secondary | ICD-10-CM | POA: Diagnosis not present

## 2020-07-18 LAB — COMPREHENSIVE METABOLIC PANEL
ALT: 39 U/L (ref 0–44)
AST: 30 U/L (ref 15–41)
Albumin: 1.4 g/dL — ABNORMAL LOW (ref 3.5–5.0)
Alkaline Phosphatase: 211 U/L — ABNORMAL HIGH (ref 38–126)
Anion gap: 7 (ref 5–15)
BUN: 41 mg/dL — ABNORMAL HIGH (ref 6–20)
CO2: 26 mmol/L (ref 22–32)
Calcium: 8 mg/dL — ABNORMAL LOW (ref 8.9–10.3)
Chloride: 100 mmol/L (ref 98–111)
Creatinine, Ser: 1 mg/dL (ref 0.44–1.00)
GFR, Estimated: >60 mL/min (ref >60)
Glucose, Bld: 115 mg/dL — ABNORMAL HIGH (ref 70–99)
Potassium: 5.4 mmol/L — ABNORMAL HIGH (ref 3.5–5.1)
Sodium: 133 mmol/L — ABNORMAL LOW (ref 135–145)
Total Bilirubin: 0.7 mg/dL (ref 0.3–1.2)
Total Protein: 5.4 g/dL — ABNORMAL LOW (ref 6.5–8.1)

## 2020-07-18 LAB — GLUCOSE, CAPILLARY
Glucose-Capillary: 110 mg/dL — ABNORMAL HIGH (ref 70–99)
Glucose-Capillary: 111 mg/dL — ABNORMAL HIGH (ref 70–99)
Glucose-Capillary: 143 mg/dL — ABNORMAL HIGH (ref 70–99)
Glucose-Capillary: 174 mg/dL — ABNORMAL HIGH (ref 70–99)
Glucose-Capillary: 45 mg/dL — ABNORMAL LOW (ref 70–99)
Glucose-Capillary: 78 mg/dL (ref 70–99)
Glucose-Capillary: <10 mg/dL — CL (ref 70–99)

## 2020-07-18 LAB — CBC WITH DIFFERENTIAL/PLATELET
Abs Immature Granulocytes: 1.67 10*3/uL — ABNORMAL HIGH (ref 0.00–0.07)
Basophils Absolute: 0.1 10*3/uL (ref 0.0–0.1)
Basophils Relative: 0 %
Eosinophils Absolute: 0.1 10*3/uL (ref 0.0–0.5)
Eosinophils Relative: 1 %
HCT: 34.3 % — ABNORMAL LOW (ref 36.0–46.0)
Hemoglobin: 10.8 g/dL — ABNORMAL LOW (ref 12.0–15.0)
Immature Granulocytes: 10 %
Lymphocytes Relative: 3 %
Lymphs Abs: 0.6 10*3/uL — ABNORMAL LOW (ref 0.7–4.0)
MCH: 33.5 pg (ref 26.0–34.0)
MCHC: 31.5 g/dL (ref 30.0–36.0)
MCV: 106.5 fL — ABNORMAL HIGH (ref 80.0–100.0)
Monocytes Absolute: 1.2 10*3/uL — ABNORMAL HIGH (ref 0.1–1.0)
Monocytes Relative: 7 %
Neutro Abs: 13.4 10*3/uL — ABNORMAL HIGH (ref 1.7–7.7)
Neutrophils Relative %: 79 %
Platelets: 485 10*3/uL — ABNORMAL HIGH (ref 150–400)
RBC: 3.22 MIL/uL — ABNORMAL LOW (ref 3.87–5.11)
RDW: 19.9 % — ABNORMAL HIGH (ref 11.5–15.5)
WBC: 16.9 10*3/uL — ABNORMAL HIGH (ref 4.0–10.5)
nRBC: 0.8 % — ABNORMAL HIGH (ref 0.0–0.2)

## 2020-07-18 LAB — RETICULOCYTES
Immature Retic Fract: 39.9 % — ABNORMAL HIGH (ref 2.3–15.9)
RBC.: 3.2 MIL/uL — ABNORMAL LOW (ref 3.87–5.11)
Retic Count, Absolute: 168.6 10*3/uL (ref 19.0–186.0)
Retic Ct Pct: 5.3 % — ABNORMAL HIGH (ref 0.4–3.1)

## 2020-07-18 LAB — FOLATE: Folate: 8.1 ng/mL (ref >5.9)

## 2020-07-18 LAB — IRON AND TIBC
Iron: 52 ug/dL (ref 28–170)
Saturation Ratios: 23 % (ref 10.4–31.8)
TIBC: 224 ug/dL — ABNORMAL LOW (ref 250–450)
UIBC: 172 ug/dL

## 2020-07-18 LAB — PHOSPHORUS: Phosphorus: 3.7 mg/dL (ref 2.5–4.6)

## 2020-07-18 LAB — HEPARIN LEVEL (UNFRACTIONATED): Heparin Unfractionated: 0.56 IU/mL (ref 0.30–0.70)

## 2020-07-18 LAB — FERRITIN: Ferritin: 650 ng/mL — ABNORMAL HIGH (ref 11–307)

## 2020-07-18 LAB — VITAMIN B12: Vitamin B-12: 588 pg/mL (ref 180–914)

## 2020-07-18 LAB — MAGNESIUM: Magnesium: 2 mg/dL (ref 1.7–2.4)

## 2020-07-18 MED ORDER — FUROSEMIDE 20 MG PO TABS
20.0000 mg | ORAL_TABLET | Freq: Every day | ORAL | Status: DC
  Filled 2020-07-18: qty 1

## 2020-07-18 MED ORDER — IPRATROPIUM BROMIDE 0.02 % IN SOLN: 0.5000 mg | Freq: Four times a day (QID) | RESPIRATORY_TRACT | Status: DC | PRN

## 2020-07-18 MED ORDER — SODIUM ZIRCONIUM CYCLOSILICATE 10 G PO PACK
10.0000 g | PACK | Freq: Once | ORAL | Status: AC
  Administered 2020-07-18: 10 g

## 2020-07-18 MED ORDER — LEVALBUTEROL HCL 0.63 MG/3ML IN NEBU: 0.6300 mg | INHALATION_SOLUTION | Freq: Four times a day (QID) | RESPIRATORY_TRACT | Status: DC | PRN

## 2020-07-18 MED ORDER — SODIUM ZIRCONIUM CYCLOSILICATE 10 G PO PACK
10.0000 g | PACK | Freq: Once | ORAL | Status: DC
  Filled 2020-07-18 (×2): qty 1

## 2020-07-18 NOTE — Progress Notes (Signed)
PROGRESS NOTE    Carolyn Proctor  ERD:[id number] DOB: August 27, 1961 DOA: 06/22/2020 PCP: Lilian Coma., MD   Brief Narrative:  Patient is a 59 year old chronically ill-appearing African-American female with a past medical history significant for proximal atrial fibrillation, history of DVT, history of PE anticoagulated Eliquis, history of heart failure with preserved ejection fraction, history of CKD stage III, history of rheumatoid arthritis, hypercoagulable state including increased protein S antigen, decreased protein C activity and decreased Antithrombin III, history of centrilobular emphysema and pulmonary fibrosis, history of chronic venous insufficiency with chronic foot ulcers, history of diverticulitis, history of mixed connective tissue disease and other comorbidities who felt like she was having a stroke and then developed a left facial droop and went unresponsive.  She subsequently required bag valve mask by EMS and was noted to be hypoglycemic and hypotensive.  She was intubated in the ED for airway protection and head CT was done which was negative.  And because she had refractory hypoglycemia despite multiple D 50 a.m. she was requiring a drip.  PCCM was called to admit for this patient.  She is admitted on 06/22/2020 and initiated on pulse dose steroids and diuresis given that she was in cardiogenic shock.  On 07/02/2020 she was on full ventilatory support and was encephalopathic precluding extubation.  926 she is but not weaning and encephalopathic with dysautonomia and off amiodarone drip and subsequently tracheostomy was placed on 07/11/2020.  Further work-up revealed that she had acute hypoxic respiratory failure with inability to protect her airway in the setting of hypoglycemia and subsequently developing HCAP with an ESBL E. coli and she has interstitial lung disease with connective tissue disease with acute pneumonitis and pulmonary edema.  She was also found to be subsequently an acute  systolic CHF and advanced heart failure team was consulted.  During her hospitalization she also developed atrial flutter with atrial tachycardia runs of nonsustained ventricular tachycardia.  Subsequently she has been placed on many antibiotics and escalated to meropenem given her ESBL.  She has now been on trach collar and she is transferred to [facility] service on 07/14/2020 after a prolonged ICU stay.  Today she spiked a temperature of 102 and became tachycardic and tachypneic.  We will panculture her again and resume antibiotics with IV meropenem. Pulmonary still will be following for trach care.  07/15/2020: She developed emesis this morning as well as chest pain and was given antimedics.  Pulmonary was reconsulted given her worsening respiratory status but she is stabilized.  Family is interested in [place] at [place].  07/16/2020: Temperature curve is improving.  PT OT recommending LTACH.  Also get speech therapy to see the patient cannot use PMV with speech therapy.  SLP not evaluated given fatigue.  [facility] status appears stable compared to yesterday.  07/17/2020: Her temperature has resolved and her WBC was daily.  Respiratory status is relatively stable and she is not as rhonchorous.  Patient thinks that she is doing better today and will continue SLP efforts.  Renal function remains stable but her sodium did drop slightly likely in setting of diuretics.  We will also need to continue to monitor hemoglobin hematocrit given that she is anticoagulated with heparin drip.  07/18/2020: Cardiology evaluated and stopping her IV Lasix today and starting p.o. Lasix 20 mg p.o. daily tomorrow.  Cardiology feels that her right heart cath would not change management at this point and I would not follow peripherally and consider right heart cath at that time if she  improves.  Shortness of breath has improved.  PT still recommending LTAC.  SLP working with the patient with the [facility] and they continue to recommend n.p.o. at  this time and then MBS be conducted to further assess physiology.  Continue her core track tube feedings.  [facility] screen the patient and want to submit short authorization.  Pulmonary evaluated and recommending continuing the #6 cuffed trach in place for swallow evaluation and mobilizing.  We will continue antibiotics for 14 days.  Assessment & Plan:   Active Problems:   Pulmonary HTN (HCC)   Acute encephalopathy   Acute respiratory failure (HCC)   Oxygen desaturation   Cardiogenic shock (HCC)   Acute renal failure with tubular necrosis (HCC)   RVF (right ventricular failure) (HCC)   E. 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 - patient doing well on minimal ventilation settings, trach collar trial - completed meropenem abx treatment because she spiked another temperature will resume for now and likely prolonged treatment and continue for 14 days total; WBC is improving daily and it is now 14.8 - continue prednisone 40 mg daily for acute pneumonitis in setting of ILD, consider slow steroid taper - Follow CXR -Mains on trach collar now -SpO2: 100 % O2 Flow Rate (L/min): 5 L/min FiO2 (%): 28 %; she was on 8 L yesterday and is improving -Repeat chest x-ray this AM pending formal read. and repeat chest x-ray today showed "Stable position of support apparatus.  No pneumothorax. Improved aeration of the lungs with minimal residual bilateral interstitial opacities with basilar predominance nonspecific with differential considerations including asymmetric pulmonary edema versus pulmonary fibrosis." -Dr. Sharma Covert discussed with her outpatient rheumatologist and they feel that this is a vasculitis picture and given her underlying  infections and they cannot use more aggressive immunosuppression other than prednisone at this time -Continue with breathing treatment and continue with Xopenex/Atrovent -Patient had a emesis episode and likely aspirated PCCM was reconsulted -Recommending continuing ATC as the patient was currently saturating 100% on 20% and wean FiO2 as able.  They are recommending routine trach care as well as as needed antiemetics and if emesis recurs they are recommending to see if Cortrak can  -Will consult PT/OT/SLP and OOB to Mobilize as Able; they are recommending LTAC -Will follow CXR in AM and repeat ABG intermittently  -Recommend that if she has further respiratory complications to reconsult and call again. -LTAC looking at the patient and [facility] is the preference; appreciate case management assistance in placement of an LTAC and they are screening the patient and will submit insurance authorization  Fever and SIRS, improving  -Temperature of 102 on 07/24/20 and was tachycardic and tachypneic -Currently no clear source of infection and she had just completed treatment for HCAP with ESBL E Coli but this is likely the source and she will likely need a prolonged antibiotic course totaling 14 days per PCCM recommendations -likely this is in the setting of her HCAP pneumonia -When she did spike her temperature a few days ago she was pancultured -chest x-ray was as above  -urinalysis showed a cloudy appearance with large hemoglobin, large leukocytes, rare bacteria, greater than 50 RBCs per high-power field, greater than 50 WBCs but urine culture showed no growth  -  Blood cultures x2 showed NGTD at 4 Days -Resumed IV meropenem and will continue for total 14 days -follow cultures and data monitor temperature curve and WBC  -WBC remains elevated but is now trending down to 16.8 -> 15.4 -> 14.8 and slightly trended up to 16.9 but remains relatively stable  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 Ephriam Jenkins with [facility]). - previously treated with rituximab >> last in November 2020 - defer further immunosuppressive therapy while she is being treated for infection -She will need close rheumatology follow-up at discharge.   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 Atrial flutter/atrial tachycardia with runs of NSVT - new this admission. - advanced heart failure team following intermittently; initially she was in cardiogenic shock due to right ventricular failure/cor pulmonale and echo was repeated and she is improved with milrinone and diuresis -Sequently developed septic shock with ESBL E. coli and was changed from cefepime to meropenem - continue amiodarone 200 mg daily -Cardiology recommending that she needs a right heart catheter currently not a candidate and they are not to pursue this on Friday and Dr. Haroldine Laws feels that she needs to be more stable before even attempts of right heart cath -Patient is - 5.073 L since admission: S -Continues to get IV Lasix 20 mg daily and this is due to be stopped today and she will be changed to p.o. Lasix 20 mg daily by cardiology in the morning -Cardiology recommends that a right heart cath would not change management at this point and they feel that she needs to be more stable and they can pursue this later on  Acute metabolic encephalopathy from hypoglycemia, hypotension, hypoxia. Seizure in setting of hypoxia with respiratory failure. 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 -  Continue heparin gtt, consider bridging back to eliquis as she is diagnosed back in June of this year  Bilateral Pressure Ulcers - Wound Consult - BID Dressing  Change w/ silvadene  Hypernatremia (Resolved) she is now on hyponatremic -Free water not been discontinued, D5W at 25 mL's per hour will now be discontinued -Sodium is now 139 and trending down slowly went to 136 yesterday and today is 128 yesterday and today it is 133 -We will stop D5W and may need to watch carefully given that she is on Lasix 20 mg IV daily and will be changed to p.o. tomorrow -Continue monitor carefully and repeat CMP in the a.m.  Hyperkalemia -Mild and slightly worsened yesterday but is improved to 5.1 today -In the setting of IV potassium replacement -Potassium was 5.2 -> 5.4 -> 5.1 -> 5.4 -She was given Lokelma 10 mg x1 on 07/16/20 and another dose today  -Likely to improve in the setting of diuresis -Continue to monitor and trend repeat CMP in the a.m.  Liver cirrhosis Abnormal LFTs, mildly elevated on last check - f/u LFTs intermittently and will repeat CMP in the a.m; LFTs are improving and AST is now 36 and ALT remains slightly elevated at 53 -We will continue monitor and trend -continue lactulose, titrate~200 mL of stool per rectal tube  Steroid Induced Hyperglycemia. - SSI with moderate NovoLog sliding scale insulin every 4 hours as well as 3 units subcu every 4 hours scheduled -CBGs ranging  from 83-143  Nausea and Emesis, improved -Likely aspirated but will continue with antiemetics and started on IV ondansetron -No further emesis now -C/w Supportive Care  Macrocytic Anemia -Patient's Hgb/Hct went from 11.2/35.9 -> 11.0/35.3 -> 10.8/35.1 and is slowly trending downward to 9.8/32.3 -Checked Anemia Panel and showed an iron level of 52, U IBC of 172, TIBC of 224, saturation ratios of 23%, ferritin level of 650, folate level of 8.1, and vitamin B12 level 588 -Continue to Monitor for S/Sx of Bleeding; No overt bleeding noted -Repeat CBC in the AM   Chest Pain, rule out ACS;chest pain has completely resolved -Chest pain a few days ago which is now  completely resolved -EKG was relatively unremarkable and nonischemic; -Troponin was initially elevated likely is demand ischemia in the setting of her nausea vomiting and her current comorbidities -Troponin levels 103 and trended down to 86 -Added nitroglycerin, morphine and she has chest pain now when she coughs  Thrombocytosis -Likely reactive -Patient's platelet count from 373 is now 425 -Continue to monitor and trend and repeat CBC in a.m.  Severe Protein Calorie Malnutrition. -Nutritionist consulted and she is on Tube feeds with Osmolite 1.5 at 60 mils per hour, 45 mill liters of Prosource tube feeding daily, 150 MLof free water every 4  DVT prophylaxis: Anticoagulated with a heparin drip Code Status: FULL CODE  Family Communication: Discussed with Aunt at bedside  Disposition Plan: Pending further improvement of her clinical status and resolution of her fevers.  Will need clearance by pulmonary as well as cardiology and may require an inpatient right heart cath if she improves  Status is: Inpatient  Remains inpatient appropriate because:Ongoing diagnostic testing needed not appropriate for outpatient work up, Unsafe d/c plan, IV treatments appropriate due to intensity of illness or inability to take PO and Inpatient level of care appropriate due to severity of illness   Dispo: The patient is from: Home              Anticipated d/c is to: LTAC              Anticipated d/c date is: > 3 days              Patient currently is not medically stable to d/c.  Consultants:   PCCM Transfer  Cardiology  Neurology  Procedures:  9/15 ETT >> 9/15 R IJ TL CVC >>9/27 9/27 PICC >  10/4 Trach  Significant Diagnostic Tests:   9/15 CTH >> negative  9/15 CTA chest/ abd/ pelvis >> significant dilatation of RA and RV consistent with R heart failure; moderate R and small L pleural effusions with associated atelectasis of both lower lobes; underlying chronic cystic and fibrotic lung  disease; severe anasarca; nodular contour of liver suggestive of possible cirrhosis  9/15 Echo> LVEF 35-40%, mod decreased LV function, RVSP 75, severely reduced RV systolic function; R to left shunt noted, suggestive of PFO, RA severely dilated, mod pericardial effusion  9/15 CTA head and neck >> no significant carotid or vertebral artery stenosis in neck, no LVO, right jugular central venous catheter with probable surrounding hematoma  9/18 MR BRAIN>> no acute intracranial abnormality  9/28 MRI brain > diffuse pachymeningeal enhancement, bilateral cerebral convexities, sub centimeter extra axial focus of T1 hyperintensity along R temporal occiptal lobe, new  Micro Data:  9/15 BCx2 central line >> NG5D 9/15 BCx2 PIV >> Strept mitis 9/15 SARS2 >> negative 9/15 UC >> ESBL E coli 9/16 Trach asp cult >> Normal flora 9/27  blood > negative 9/27 resp > e coli ESBL  Antimicrobials:  9/15 vanc >> 9/17 9/15 zosyn >> 9/20  9/27 vanc > 9/28 9/27 cefepime > 9/29 9/29 meropenem > 10/5 and resumed on 10/6 >  Antimicrobials:  Anti-infectives (From admission, onward)   Start     Dose/Rate Route Frequency Ordered Stop   07/14/20 1400  meropenem (MERREM) 2 g in sodium chloride 0.9 % 100 mL IVPB        2 g 200 mL/hr over 30 Minutes Intravenous Every 8 hours 07/14/20 1339     07/06/20 1400  meropenem (MERREM) 2 g in sodium chloride 0.9 % 100 mL IVPB        2 g 200 mL/hr over 30 Minutes Intravenous Every 8 hours 07/06/20 1124 07/12/20 2230   07/05/20 1000  vancomycin (VANCOREADY) IVPB 500 mg/100 mL  Status:  Discontinued        500 mg 100 mL/hr over 60 Minutes Intravenous Every 12 hours 07/04/20 2127 07/05/20 0902   07/04/20 2200  ceFEPIme (MAXIPIME) 2 g in sodium chloride 0.9 % 100 mL IVPB  Status:  Discontinued        2 g 200 mL/hr over 30 Minutes Intravenous Every 12 hours 07/04/20 0929 07/06/20 1124   07/04/20 2200  vancomycin (VANCOREADY) IVPB 500 mg/100 mL  Status:  Discontinued         500 mg 100 mL/hr over 60 Minutes Intravenous Every 12 hours 07/04/20 1149 07/04/20 2127   07/04/20 2200  vancomycin (VANCOCIN) IVPB 1000 mg/200 mL premix        1,000 mg 200 mL/hr over 60 Minutes Intravenous  Once 07/04/20 2127 07/05/20 0032   07/04/20 0930  vancomycin (VANCOCIN) IVPB 1000 mg/200 mL premix  Status:  Discontinued        1,000 mg 200 mL/hr over 60 Minutes Intravenous  Once 07/04/20 0929 07/04/20 2127   07/04/20 0930  vancomycin (VANCOREADY) IVPB 500 mg/100 mL  Status:  Discontinued        500 mg 100 mL/hr over 60 Minutes Intravenous Every 12 hours 07/04/20 0929 07/04/20 1149   06/23/20 1015  vancomycin (VANCOREADY) IVPB 750 mg/150 mL  Status:  Discontinued        750 mg 150 mL/hr over 60 Minutes Intravenous Every 12 hours 06/23/20 1007 06/24/20 0849   06/23/20 1000  piperacillin-tazobactam (ZOSYN) IVPB 3.375 g        3.375 g 12.5 mL/hr over 240 Minutes Intravenous Every 8 hours 06/23/20 0948 06/28/20 0805   06/22/20 1230  vancomycin (VANCOREADY) IVPB 1250 mg/250 mL        1,250 mg 166.7 mL/hr over 90 Minutes Intravenous  Once 06/22/20 1226 06/22/20 1535   06/22/20 1230  piperacillin-tazobactam (ZOSYN) IVPB 3.375 g        3.375 g 100 mL/hr over 30 Minutes Intravenous  Once 06/22/20 1226 06/22/20 1502       Subjective: Seen and examined at bedside and feels that she is doing better today.  Denies any chest pain today.  Feels shortness of breath is stable and improved.  Overall feels she is doing better.  Working with SLP with the [facility] training and is to get a MBS.  No other concerns or complaints at this time and cardiology going to change her to p.o. diuresis and will follow peripherally given that a right heart cath is going to be deferred at this time.  No family at bedside and no other questions or concerns or complaints at this time  Objective: Vitals:   07/18/20 0819 07/18/20 1127 07/18/20 1158 07/18/20 1543  BP:   102/66   Pulse: 87 91 92 89  Resp: (!) 21 19 (!)  24 (!) 24  Temp:   98.2 F (36.8 C)   TempSrc:   Axillary   SpO2: 99% 100% 98% 100%  Weight:      Height:        Intake/Output Summary (Last 24 hours) at 07/18/2020 1647 Last data filed at 07/18/2020 1626 Gross per 24 hour  Intake --  Output 2650 ml  Net -2650 ml   Filed Weights   07/16/20 0455 07/17/20 0357 07/18/20 0500  Weight: 63.8 kg 65.2 kg 62.3 kg   Examination: Physical Exam:  Constitutional: The patient is a thin chronically ill-appearing African-American female who is in no acute distress today and mildly tachypneic Eyes: Lids and conjunctivae normal, sclerae anicteric  ENMT: External Ears, Nose appear normal. Grossly normal hearing.  Neck: Neck has a cuffed tracheostomy in place connected to a TCM nurse  Respiratory: Diminished to auscultation bilaterally with coarse breath sounds and some minimal crackles and does have some rhonchi at the bases.  Has a slightly increased respiratory effort and mildly tachypneic but not using accessory muscles of breathing and is wearing 5 L of supplemental oxygen via nasal cannula connected to the ATC Cardiovascular: Slightly tachycardic rate but regular rhythm., no murmurs / rubs / gallops. S1 and S2 auscultated. No extremity edema. Abdomen: Soft, non-tender, non-distended. Bowel sounds positive.  GU: Deferred. Musculoskeletal: No clubbing / cyanosis of digits/nails. No joint deformity upper and lower extremities. Skin: Has bilateral pressure ulcers and her legs were and Prevalon boots. No induration; Warm and dry.  Neurologic: CN 2-12 grossly intact with no focal deficits. Romberg sign and cerebellar reflexes not assessed.  Psychiatric: Normal judgment and insight. Alert and oriented x 3. Normal mood and appropriate affect.   Data Reviewed: I have personally reviewed following labs and imaging studies  CBC: Recent Labs  Lab 07/14/20 0514 07/15/20 0517 07/16/20 0506 07/17/20 0500 07/18/20 0536  WBC 21.5* 16.8* 15.4* 14.8*  16.9*  NEUTROABS  --  14.7* 13.3* 11.9* 13.4*  HGB 11.2* 11.0* 10.8* 9.8* 10.8*  HCT 35.9* 35.3* 35.1* 32.3* 34.3*  MCV 106.5* 106.6* 106.0* 106.3* 106.5*  PLT 367 314 373 425* 322*   Basic Metabolic Panel: Recent Labs  Lab 07/14/20 0514 07/15/20 0517 07/16/20 0506 07/17/20 0500 07/18/20 0536  NA 142 139 136 128* 133*  K 4.7 5.2* 5.4* 5.1 5.4*  CL 109 110 105 96* 100  CO2 21* 19* '24 23 26  ' GLUCOSE 120* 138* 162* 257* 115*  BUN 52* 47* 39* 39* 41*  CREATININE 1.20* 1.17* 1.08* 1.10* 1.00  CALCIUM 8.2* 7.9* 8.2* 7.7* 8.0*  MG 2.0 2.0 1.9 2.0 2.0  PHOS 3.0 2.7 3.4 3.1 3.7   GFR: Estimated Creatinine Clearance: 61 mL/min (by C-G formula based on SCr of 1 mg/dL). Liver Function Tests: Recent Labs  Lab 07/15/20 0517 07/16/20 0506 07/17/20 0500 07/18/20 0536  AST 45* 33 36 30  ALT 67* 53* 53* 39  ALKPHOS 210* 199* 205* 211*  BILITOT 0.7 0.9 0.8 0.7  PROT 5.3* 5.7* 5.0* 5.4*  ALBUMIN 1.5* 1.5* 1.4* 1.4*   No results for input(s): LIPASE, AMYLASE in the last 168 hours. No results for input(s): AMMONIA in the last 168 hours. Coagulation Profile: No results for input(s): INR, PROTIME in the last 168 hours. Cardiac Enzymes: No results for input(s): CKTOTAL, CKMB, CKMBINDEX,  TROPONINI in the last 168 hours. BNP (last 3 results) No results for input(s): PROBNP in the last 8760 hours. HbA1C: No results for input(s): HGBA1C in the last 72 hours. CBG: Recent Labs  Lab 07/17/20 1554 07/17/20 2030 07/17/20 2347 07/18/20 0352 07/18/20 1239  GLUCAP 92 151* 83 110* 143*   Lipid Profile: No results for input(s): CHOL, HDL, LDLCALC, TRIG, CHOLHDL, LDLDIRECT in the last 72 hours. Thyroid Function Tests: No results for input(s): TSH, T4TOTAL, FREET4, T3FREE, THYROIDAB in the last 72 hours. Anemia Panel: Recent Labs    07/18/20 0534 07/18/20 0536  VITAMINB12  --  588  FOLATE 8.1  --   FERRITIN  --  650*  TIBC  --  224*  IRON  --  52  RETICCTPCT  --  5.3*   Sepsis  Labs: No results for input(s): PROCALCITON, LATICACIDVEN in the last 168 hours.  Recent Results (from the past 240 hour(s))  Culture, Urine     Status: None   Collection Time: 07/14/20  1:15 PM   Specimen: Urine, Random  Result Value Ref Range Status   Specimen Description URINE, RANDOM  Final   Special Requests NONE  Final   Culture   Final    NO GROWTH Performed at [facility], [address], [place], [place] [address]    Report Status 07/15/2020 FINAL  Final  Culture, blood (routine x 2)     Status: None (Preliminary result)   Collection Time: 07/14/20  2:06 PM   Specimen: BLOOD LEFT ARM  Result Value Ref Range Status   Specimen Description BLOOD LEFT ARM  Final   Special Requests   Final    BOTTLES DRAWN AEROBIC ONLY Blood Culture adequate volume   Culture   Final    NO GROWTH 4 DAYS Performed at [facility], [place] [address], [place], [place] [address]    Report Status PENDING  Incomplete  Culture, blood (routine x 2)     Status: None (Preliminary result)   Collection Time: 07/14/20  2:10 PM   Specimen: BLOOD LEFT HAND  Result Value Ref Range Status   Specimen Description BLOOD LEFT HAND  Final   Special Requests   Final    BOTTLES DRAWN AEROBIC ONLY Blood Culture adequate volume   Culture   Final    NO GROWTH 4 DAYS Performed at [facility], [place] [address], [place], [place] [address]    Report Status PENDING  Incomplete     RN Pressure Injury Documentation:     Estimated body mass index is 19.16 kg/m as calculated from the following:   Height as of this encounter: '5\' 11"'  (1.803 m).   Weight as of this encounter: 62.3 kg.  Malnutrition Type:  Nutrition Problem: Severe Malnutrition Etiology: chronic illness   Malnutrition Characteristics:  Signs/Symptoms: severe muscle depletion, energy intake < or equal to 75% for > or equal to 1 month, moderate fat depletion, edema   Nutrition Interventions:  Interventions: Tube feeding,  Prostat   Radiology Studies: DG CHEST PORT 1 VIEW  Result Date: 07/18/2020 CLINICAL DATA:  Shortness of breath. Pneumonia. Additional history provided by technologist: Tracheostomy, pneumonia, respiratory arrest. EXAM: PORTABLE CHEST 1 VIEW COMPARISON:  Prior chest radiographs 07/17/2020 and earlier. FINDINGS: Unchanged position of a tracheostomy tube and right-sided PICC. An enteric tube passes below level of left hemidiaphragm with tip excluded from the field of view. Unchanged cardiomegaly. Subtle new patchy airspace disease is questioned within the left mid  to upper lung field. Stable background chronic interstitial prominence. No definite pleural effusion. No evidence of pneumothorax. No acute bony abnormality identified. IMPRESSION: Stable position of visible support apparatus as described. Subtle new patchy airspace disease is questioned within the left mid to upper lung field. Consider short interval radiographic follow-up. Unchanged cardiomegaly. Stable background chronic prominence of the interstitial lung markings. Electronically Signed   By: Kellie Simmering DO   On: 07/18/2020 08:18   DG CHEST PORT 1 VIEW  Result Date: 07/17/2020 CLINICAL DATA:  Shortness of breath. EXAM: PORTABLE CHEST 1 VIEW COMPARISON:  One-view chest x-ray 07/16/2020 FINDINGS: Right-sided PICC line is stable. Feeding tube courses off the inferior border the film. Tracheostomy tube is in place. The heart is enlarged. Diffuse interstitial pattern is stable, likely chronic. No significant airspace consolidation is present. IMPRESSION: 1. Stable cardiomegaly without failure. 2. Stable chronic interstitial pattern. 3. Support apparatus is stable. Electronically Signed   By: San Morelle M.D.   On: 07/17/2020 12:21   Scheduled Meds: . amiodarone  200 mg Per Tube Daily  . chlorhexidine gluconate (MEDLINE KIT)  15 mL Mouth Rinse BID  . Chlorhexidine Gluconate Cloth  6 each Topical Q0600  . feeding supplement (PROSource  TF)  45 mL Per Tube Daily  . [START ON 07/19/2020] furosemide  20 mg Oral Daily  . influenza vac split quadrivalent PF  0.5 mL Intramuscular Tomorrow-1000  . insulin aspart  0-15 Units Subcutaneous Q4H  . insulin aspart  3 Units Subcutaneous Q4H  . lactulose  30 g Per Tube Daily  . levETIRAcetam  500 mg Per Tube BID  . mouth rinse  15 mL Mouth Rinse 10 times per day  . pantoprazole sodium  40 mg Per Tube Daily  . predniSONE  40 mg Per Tube Q breakfast  . silver sulfADIAZINE   Topical BID  . sodium chloride flush  10-40 mL Intracatheter Q12H   Continuous Infusions: . sodium chloride 10 mL (07/17/20 2300)  . feeding supplement (OSMOLITE 1.5 CAL) 1,000 mL (07/18/20 1028)  . heparin 1,450 Units/hr (07/18/20 0327)  . meropenem (MERREM) IV 2 g (07/18/20 1605)    LOS: 26 days   Kerney Elbe, DO [facility] PAGER is on [place]  If 7PM-7AM, please contact night-coverage [email or web address]

## 2020-07-18 NOTE — Progress Notes (Addendum)
Advanced Heart Failure Rounding Note   Subjective:    Remains on 5 liters via trach collar.   Denies SOB.   Objective:   Weight Range:  Vital Signs:   Temp:  [98.4 F (36.9 C)-99.7 F (37.6 C)] 98.7 F (37.1 C) (10/11 0727) Pulse Rate:  [85-104] 87 (10/11 0819) Resp:  [18-26] 21 (10/11 0819) BP: (100-125)/(66-81) 110/74 (10/11 0727) SpO2:  [98 %-100 %] 99 % (10/11 0819) FiO2 (%):  [28 %] 28 % (10/11 0811) Weight:  [62.3 kg] 62.3 kg (10/11 0500) Last BM Date: 07/18/20 (rectal tube)  Weight change: Filed Weights   07/16/20 0455 07/17/20 0357 07/18/20 0500  Weight: 63.8 kg 65.2 kg 62.3 kg    Intake/Output:   Intake/Output Summary (Last 24 hours) at 07/18/2020 1015 Last data filed at 07/17/2020 1944 Gross per 24 hour  Intake --  Output 2950 ml  Net -2950 ml     Physical Exam: General:  Appears chronically ill HEENT: normal Neck: Trach. Supple. no JVD. Carotids 2+ bilat; no bruits. No lymphadenopathy or thryomegaly appreciated. Cor: PMI nondisplaced. Regular rate & rhythm. No rubs, gallops. 2/6 TR. Lungs: clear Abdomen: soft, nontender, nondistended. No hepatosplenomegaly. No bruits or masses. Good bowel sounds. Extremities: no cyanosis, clubbing, rash, edema. RUE PICC. RLE dressing.   Neuro: alert . Follows commands. MAE.    Telemetry: SR 90s. EKG now. ? Tachycardia overnight but not accurate--> EKG SR 89 bpm      Labs: Basic Metabolic Panel: Recent Labs  Lab 07/14/20 0514 07/14/20 0514 07/15/20 0517 07/15/20 0517 07/16/20 0506 07/17/20 0500 07/18/20 0536  NA 142  --  139  --  136 128* 133*  K 4.7  --  5.2*  --  5.4* 5.1 5.4*  CL 109  --  110  --  105 96* 100  CO2 21*  --  19*  --  _0 GLUCOSE 120*  --  138*  --  162* 257* 115*  BUN 52*  --  47*  --  39* 39* 41*  CREATININE 1.20*  --  1.17*  --  1.08* 1.10* 1.00  CALCIUM 8.2*   < > 7.9*   < > 8.2* 7.7* 8.0*  MG 2.0  --  2.0  --  1.9 2.0 2.0  PHOS 3.0  --  2.7  --  3.4 3.1 3.7   < >  = values in this interval not displayed.    Liver Function Tests: Recent Labs  Lab 07/15/20 0517 07/16/20 0506 07/17/20 0500 07/18/20 0536  AST 45* 33 36 30  ALT 67* 53* 53* 39  ALKPHOS 210* 199* 205* 211*  BILITOT 0.7 0.9 0.8 0.7  PROT 5.3* 5.7* 5.0* 5.4*  ALBUMIN 1.5* 1.5* 1.4* 1.4*   No results for input(s): LIPASE, AMYLASE in the last 168 hours. No results for input(s): AMMONIA in the last 168 hours.  CBC: Recent Labs  Lab 07/14/20 0514 07/15/20 0517 07/16/20 0506 07/17/20 0500 07/18/20 0536  WBC 21.5* 16.8* 15.4* 14.8* 16.9*  NEUTROABS  --  14.7* 13.3* 11.9* 13.4*  HGB 11.2* 11.0* 10.8* 9.8* 10.8*  HCT 35.9* 35.3* 35.1* 32.3* 34.3*  MCV 106.5* 106.6* 106.0* 106.3* 106.5*  PLT 367 314 373 425* 485*    Cardiac Enzymes: No results for input(s): CKTOTAL, CKMB, CKMBINDEX, TROPONINI in the last 168 hours.  BNP: BNP (last 3 results) No results for input(s): BNP in the last 8760 hours.  ProBNP (last 3 results) No results for input(s): PROBNP  in the last 8760 hours.    Other results:  Imaging: DG CHEST PORT 1 VIEW  Result Date: 07/18/2020 CLINICAL DATA:  Shortness of breath. Pneumonia. Additional history provided by technologist: Tracheostomy, pneumonia, respiratory arrest. EXAM: PORTABLE CHEST 1 VIEW COMPARISON:  Prior chest radiographs 07/17/2020 and earlier. FINDINGS: Unchanged position of a tracheostomy tube and right-sided PICC. An enteric tube passes below level of left hemidiaphragm with tip excluded from the field of view. Unchanged cardiomegaly. Subtle new patchy airspace disease is questioned within the left mid to upper lung field. Stable background chronic interstitial prominence. No definite pleural effusion. No evidence of pneumothorax. No acute bony abnormality identified. IMPRESSION: Stable position of visible support apparatus as described. Subtle new patchy airspace disease is questioned within the left mid to upper lung field. Consider short  interval radiographic follow-up. Unchanged cardiomegaly. Stable background chronic prominence of the interstitial lung markings. Electronically Signed   By: Kellie Simmering DO   On: 07/18/2020 08:18   DG CHEST PORT 1 VIEW  Result Date: 07/17/2020 CLINICAL DATA:  Shortness of breath. EXAM: PORTABLE CHEST 1 VIEW COMPARISON:  One-view chest x-ray 07/16/2020 FINDINGS: Right-sided PICC line is stable. Feeding tube courses off the inferior border the film. Tracheostomy tube is in place. The heart is enlarged. Diffuse interstitial pattern is stable, likely chronic. No significant airspace consolidation is present. IMPRESSION: 1. Stable cardiomegaly without failure. 2. Stable chronic interstitial pattern. 3. Support apparatus is stable. Electronically Signed   By: San Morelle M.D.   On: 07/17/2020 12:21     Medications:     Scheduled Medications: . amiodarone  200 mg Per Tube Daily  . chlorhexidine gluconate (MEDLINE KIT)  15 mL Mouth Rinse BID  . Chlorhexidine Gluconate Cloth  6 each Topical Q0600  . feeding supplement (PROSource TF)  45 mL Per Tube Daily  . free water  150 mL Per Tube Q4H  . furosemide  20 mg Intravenous Daily  . influenza vac split quadrivalent PF  0.5 mL Intramuscular Tomorrow-1000  . insulin aspart  0-15 Units Subcutaneous Q4H  . insulin aspart  3 Units Subcutaneous Q4H  . lactulose  30 g Per Tube Daily  . levETIRAcetam  500 mg Per Tube BID  . mouth rinse  15 mL Mouth Rinse 10 times per day  . pantoprazole sodium  40 mg Per Tube Daily  . predniSONE  40 mg Per Tube Q breakfast  . silver sulfADIAZINE   Topical BID  . sodium chloride flush  10-40 mL Intracatheter Q12H  . sodium zirconium cyclosilicate  10 g Per Tube Once    Infusions: . sodium chloride 10 mL (07/17/20 2300)  . feeding supplement (OSMOLITE 1.5 CAL) 1,000 mL (07/17/20 1537)  . heparin 1,450 Units/hr (07/18/20 0327)  . meropenem (MERREM) IV 2 g (07/18/20 0511)    PRN Medications: Place/Maintain  arterial line **AND** sodium chloride, acetaminophen (TYLENOL) oral liquid 160 mg/5 mL, ipratropium, levalbuterol, melatonin, midazolam, morphine injection, nitroGLYCERIN, ondansetron (ZOFRAN) IV, polyethylene glycol, sodium chloride flush, white petrolatum    Plan/Discussion:    1. Shock  - initially cardiogenic shock due to severe RV failure/cor pulmonale  - Echo 06/22/20 Echo LV small compressed from RV EF 45-50% RV massively dilated and severely hypokinetic. Severe TR RVSP 76 Severe D-sign. Moderate pericardial effusion  - improved with milrinone and diuresis. - On 9/27 developed component of septic shock. BCx NGTD. E coli (ESBL) in sputum - ESBL resistant to cefepime =>Switched to mero - T 99.7  ,  -  TRH managing. Would keep on prednisone 40 daily for now and not wean.   2. [place] RV/failure - RH cath in 12/20 with mild PAH with normal RV function. - PFTs much better than I expected based on CT chest. DLCO markedly reduced - Echo 06/22/20 Echo LV small compressed from RV EF 45-50% RV massively dilated and severely hypokinetic. Severe TR RVSP 76 Severe D-sign. Moderate pericardial effusion  - Milrinone stopped 9/26.  - Volume status looks stable. Stop IV lasix. Start po lasix 20 mg daily tomorrow.  - Ideally need RHC but currently not a candidate. We will continue to follow peripherally to assess timing  3. Severe encephalopathy - etiology unclear - repeat head CT unrevealing.  - brain MRI on 9/18 with no evidence vasculitis (has received steroids). Repeat brain MRI with findings as above. (unclear significance)  - improved with increasing steroids  4. Acute hypoxic respiratory failure - multifactorial including PAH, ILD, large R effusion, pulmonary edema and possible aspiration  - Tracheal aspirate now with ESBL E.coli. Continue abx therapy w/ meropenum - s/p trach 10/4 on 5L   5. 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  - Remains on lactulose . TRH managing   6. CTD with h/o RA and SLE - Case d/w with Dr. [place] Bells. She has had rapid progression of RV failure along with mental status changes and lung infiltrates in setting of known CTD. Several markers elevated- - Agree that this is mostly likely CTD flare.  - Steroids have been increased. Would continue for extended course - Dr. Sherrin Daisy has discussed w/ her outpatient Rheumatologist. He feels that she has a vasculitis picture. - Given her underlying infections (currently ESBL) we can't use more aggressive immunosuppression other than prednisone at this time. Would be careful with steroid taper.  - Per Dr Audreena Paradise would continue prednisone 40 daily.   7. DVT/PE - diagnosed 6/21 at [place] - CT this admit negative for acute PE - Remains on heparin drip. Ok to switch to eliquis.   8. AFL/atrial tach with runs NSVT  - in NSR - Keep amio at 200 daily  9. Hypernatremia - Resolved.   10. Hyperkalemia  Received Lokelma this morning.    Length of Stay: [place], NP-C  07/18/2020, 10:15 AM  Advanced Heart Failure Team Pager [phone] (M-F; 7a - 4p)  Please contact [facility] for night-coverage after hours (4p -7a ) and weekends on [email or web address]  Patient seen and examined with the above-signed Advanced Practice Provider and/or Housestaff. I personally reviewed laboratory data, imaging studies and relevant notes. I independently examined the patient and formulated the important aspects of the plan. I have edited the note to reflect any of my changes or salient points. I have personally discussed the plan with the patient and/or family.  Improving slowly but still quite ill. Remains on trach collar. Still with low-grade temps. Weight down on IV lasix. Rhythm stable.   General:  Chronically ill appearing. No resp difficulty HEENT: normal Neck: supple. no JVD. + trach Carotids 2+ bilat; no bruits. No lymphadenopathy or  thryomegaly appreciated. Cor: PMI nondisplaced. Regular rate & rhythm. 2/6 TR Lungs: clear Abdomen: soft, nontender, nondistended. No hepatosplenomegaly. No bruits or masses. Good bowel sounds. Extremities: no cyanosis, clubbing, rash, edema + wound RLE Neuro: alert & orientedx3, weak. Follows commands  Volume status ok. Agree with switching to po lasix. RHC would not  change management at this point. Will continue to follow at a distance and if improving will consider RHC at that time.    Glori Bickers, MD  4:38 PM

## 2020-07-18 NOTE — Progress Notes (Signed)
  Speech Language Pathology Treatment: Nada Boozer Speaking valve  Patient Details Name: Carolyn Proctor MRN: [id number] DOB: Jun 03, 1961 Today's Date: 07/18/2020 Time: 7619-5093 SLP Time Calculation (min) (ACUTE ONLY): 15 min  Assessment / Plan / Recommendation Clinical Impression  Pt was alert and coopertive throughout the session. Vitals were RR 20, SpO2 100, and HR 90 at baseline. Pt was suctioned by RT immediately prior to the session and pt's cuff was deflated upon SLP's entry. Pt tolerated finger occlusion and was able to phonate without evidence of air trapping. She tolerated PMSV for 18 minutes total including the time of the swallow evaluation. Vitals range was RR 19-26, SpO2 98-99, and HR 89-94 during this period and pt denied any respiratory difficulty. Vocal quality was consistently and moderate-severely hoarse. Pt stated that her voice is typically "raspy" and that her current vocal quality is close to her baseline. Per the pt, individuals frequently have difficulty understanding her due to her voice. Vocal intensity was reduced and pt required moderate cues to improve coordination of respiration with speech. She was able to produce phrases and sentences, but speech intelligibility was reduced and she required moderate cues to improve vocal intensity. PSMV was removed at end of session. It is recommended that PMSV be used intermittently with staff and that full supervision be provided when it is in place. SLP will continue to follow pt.    HPI HPI: Patient is a 59 year old female. She presented to the hospital with reports of stroke like symptoms. She has had a complicated hospital stay which included intubation from 9/15-10/4 with trach placed on 10/4.  PMH: lupus, a-fib, CHF, tachycardia, thrombocytosis, right ventricular dysfunction, rheumatoid lung disease, RA.       SLP Plan  Continue with current plan of care       Recommendations         Patient may use Passy-Muir Speech  Valve: Intermittently with supervision PMSV Supervision: Full MD: Please consider changing trach tube to : Smaller size;Cuffless (if/when medically appropriate )         Oral Care Recommendations: Oral care QID Follow up Recommendations: LTACH;Skilled Nursing facility SLP Visit Diagnosis: Aphonia (R49.1) Plan: Continue with current plan of care       Angelus Hoopes I. Hardin Negus, [place], [place] Office number [phone] Pager [place] 07/18/2020, 12:42 PM

## 2020-07-18 NOTE — Progress Notes (Signed)
SLP Cancellation Note  Patient Details Name: Carolyn Proctor MRN: [id number] DOB: 1961-06-22   Cancelled treatment:       Reason Eval/Treat Not Completed: Patient at procedure or test/unavailable (Pt with RT at this time. SLP will follow up. )  Cayson Kalb I. Hardin Negus, [place], [place] Office number [phone] Pager [place] 07/18/2020, 11:30 AM

## 2020-07-18 NOTE — Progress Notes (Signed)
Physical Therapy Treatment Patient Details Name: Carolyn Proctor MRN: [id number] DOB: [birth year] Today's Date: 07/18/2020    History of Present Illness Patient is a 59 year old female. She presented to the hospital with reports of stroke like sympotms. She had a complicated hospital dourase which included intubation for a few weeks. She is currently on a trach. PMH: lupus, a-fib, CHF, tachycardia, thrombocytoisis, right ventricular dysfucntion, rhematoid lung disease; RA     PT Comments    Patient progressing slowly towards PT goals. Pt more sleepy today during session needing cues to stay awake and attended to task. Tolerated there ex in supine and sitting EOB with total A for support. Able to activate spinal/cervical extensors with cues but not sustain. Follows commands well and answers appropriately for yes/no questions with head nods or thumbs up.  HR up to 146 bpm max with activity but good BP response to exercise. Recommend lift to chair with nursing. Will follow.   Follow Up Recommendations  LTACH     Equipment Recommendations  Other (comment) (defer to next venue)    Recommendations for Other Services       Precautions / Restrictions Precautions Precautions: Fall Precaution Comments: trach, watch HR Restrictions Weight Bearing Restrictions: No    Mobility  Bed Mobility Overal bed mobility: Needs Assistance Bed Mobility: Supine to Sit;Sit to Supine     Supine to sit: Total assist;+2 for physical assistance;+2 for safety/equipment Sit to supine: Total assist;+2 for physical assistance;+2 for safety/equipment   General bed mobility comments: Pt attempting to help minimally with LEs with max step by step cues, assist with trunk and scooting bottom to EOB. Mild dizziness which resolved. Assist to return to supine.  Transfers                 General transfer comment: Deferred due to safety/weakness.  Ambulation/Gait                 Stairs              Wheelchair Mobility    Modified Rankin (Stroke Patients Only)       Balance Overall balance assessment: Needs assistance Sitting-balance support: Feet supported;No upper extremity supported Sitting balance-Leahy Scale: Zero Sitting balance - Comments: Total A for sitting balance, able to initiate spinal extensors and cervical extensors but not sustain posture due to fatigue. Worked on therex sitting EOB with full support. Postural control: Posterior lean                                  Cognition Arousal/Alertness: Lethargic Behavior During Therapy: WFL for tasks assessed/performed Overall Cognitive Status: Difficult to assess                                 General Comments: pt answering yes/no questions (appears appropriate), following all simple commands. Appears tired today.      Exercises General Exercises - Upper Extremity Shoulder Flexion: AAROM;Both;5 reps;Seated Elbow Flexion: AAROM;Both;5 reps;Seated Elbow Extension: AAROM;5 reps;Seated General Exercises - Lower Extremity Ankle Circles/Pumps: AROM;Both;10 reps;Supine Quad Sets: AROM;Both;10 reps;Supine Long Arc Quad: AROM;Both;5 reps;Seated Heel Slides: AAROM;Both;5 reps;Supine Other Exercises Other Exercises: Scapular retraction x10    General Comments General comments (skin integrity, edema, etc.): HR up to 146 bpm max during session. Good Bp response to exercise.      Pertinent Vitals/Pain Pain Assessment: Faces  Faces Pain Scale: No hurt    Home Living                      Prior Function            PT Goals (current goals can now be found in the care plan section) Progress towards PT goals: Progressing toward goals    Frequency    Min 2X/week      PT Plan Current plan remains appropriate    Co-evaluation              AM-PAC PT "6 Clicks" Mobility   Outcome Measure  Help needed turning from your back to your side while in a flat bed  without using bedrails?: Total Help needed moving from lying on your back to sitting on the side of a flat bed without using bedrails?: Total Help needed moving to and from a bed to a chair (including a wheelchair)?: Total Help needed standing up from a chair using your arms (e.g., wheelchair or bedside chair)?: Total Help needed to walk in hospital room?: Total Help needed climbing 3-5 steps with a railing? : Total 6 Click Score: 6    End of Session Equipment Utilized During Treatment: Oxygen (28% Fi02 trach collar 5L) Activity Tolerance: Patient tolerated treatment well Patient left: in bed;with call bell/phone within reach;with bed alarm set;Other (comment) (Prevalon boots donned) Nurse Communication: Mobility status;Need for lift equipment PT Visit Diagnosis: Other abnormalities of gait and mobility (R26.89);Muscle weakness (generalized) (M62.81);Difficulty in walking, not elsewhere classified (R26.2)     Time: 8502-7741 PT Time Calculation (min) (ACUTE ONLY): 23 min  Charges:  $Therapeutic Exercise: 8-22 mins $Therapeutic Activity: 8-22 mins                     Marisa Severin, PT, DPT Acute Rehabilitation Services Pager [phone] Office [place] 07/18/2020, 11:08 AM

## 2020-07-18 NOTE — Evaluation (Signed)
Clinical/Bedside Swallow Evaluation Carolyn Proctor Details  Name: Carolyn Proctor MRN: [id number] Date of Birth: 04/01/1961  Today's Date: 07/18/2020 Time: SLP Start Time (ACUTE ONLY): 1148 SLP Stop Time (ACUTE ONLY): 1158 SLP Time Calculation (min) (ACUTE ONLY): 10 min  Past Medical History:  Past Medical History:  Diagnosis Date  . Acute myopericarditis 02/24/2013  . Atrial fibrillation (HCC)   . Atrial fibrillation with RVR (HCC) 02/22/2013  . Chronic anticoagulation, Xarelto 04/02/2013  . Chronic diastolic CHF (congestive heart failure) (HCC) 06/26/2019  . Diverticulitis   . Elevated troponin- secondary to Myopericarditis 02/25/2013  . Emphysema  07/07/2019  . Hypertension   . Lupus (HCC) 09/14/2011  . Multifocal atrial tachycardia (HCC) 02/22/2013  . PAF (paroxysmal atrial fibrillation) (HCC) 04/02/2013  . Pleuritic chest pain 02/22/2013  . 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 Rheumatoid arthritis (HCC) 07/07/2019  . 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 Right ventricular dysfunction 08/05/2019  . Thrombocytosis (HCC) 09/14/2011   Past Surgical History:  Past Surgical History:  Procedure Laterality Date  . BONE MARROW BIOPSY  2010  . CARDIAC CATHETERIZATION    . LEFT HEART CATHETERIZATION WITH CORONARY ANGIOGRAM N/A 02/24/2013   Procedure: LEFT HEART CATHETERIZATION WITH CORONARY ANGIOGRAM;  Surgeon: David W Harding, MD;  Location: [facility] CATH LAB;  Service: Cardiovascular;  Laterality: N/A;  . RIGHT/LEFT HEART CATH AND CORONARY ANGIOGRAPHY N/A 09/09/2019   Procedure: RIGHT/LEFT HEART CATH AND CORONARY ANGIOGRAPHY;  Surgeon: Varanasi, Jayadeep S, MD;  Location: [facility] INVASIVE CV LAB;  Service: Cardiovascular;  Laterality: N/A;   HPI:   Carolyn Proctor is a 59 year old female. Carolyn Proctor presented to the hospital with reports of stroke like, left facial droop and  unresponsiveness. Carolyn Proctor has had a complicated hospital stay which included intubation from 9/15-10/4 with trach placed on 10/4.  PMH: lupus, a-fib, CHF, tachycardia, thrombocytosis, right ventricular dysfunction, rheumatoid lung disease, RA. CT hyead and MRI 9/15 and 9/18 were negative for acute changes. MRI brain 9/28: bilateral cerebral convexities measuring up to 3 mm in greatest thickness. Appears to predominantly correspond with diffuse pachymeningeal dural thickening and enhancement. However, small bilateral subdural effusions difficult to exclude.    Assessment / Plan / Recommendation Clinical Impression  Pt was seen for bedside swallow evaluation and Carolyn Proctor denied a history of dysphagia. Oral mechanism exam was limited due to pt's difficulty following commands and opening her mouth adequately; however, oral motor strength and ROM appeared grossly WFL and dentition was adequate. Carolyn Proctor exhibited throat clearing following intake of ice chips, indicating possible aspiration and secondary swallows were noted with puree boluses, suggesting possible pharyngeal residue. It is recommended that her NPO status be maintained and that a modified barium swallow study be conducted to further assess physiology.  SLP Visit Diagnosis: Dysphagia, unspecified (R13.10)    Aspiration Risk       Diet Recommendation NPO;Alternative means - temporary   Medication Administration: Via alternative means    Other  Recommendations Oral Care Recommendations: Oral care QID;Staff/trained caregiver to provide oral care   Follow up Recommendations LTACH      Frequency and Duration min 2x/week  2 weeks       Prognosis Prognosis for Safe Diet Advancement: Fair Barriers to Reach Goals: Severity of deficits      Swallow Study   General Date of Onset: 07/11/20 HPI:  Carolyn Proctor is   a 59 year old female. Carolyn Proctor presented to the hospital with reports of stroke like, left facial droop and unresponsiveness. Carolyn Proctor has had a complicated  hospital stay which included intubation from 9/15-10/4 with trach placed on 10/4.  PMH: lupus, a-fib, CHF, tachycardia, thrombocytosis, right ventricular dysfunction, rheumatoid lung disease, RA. CT hyead and MRI 9/15 and 9/18 were negative for acute changes. MRI brain 9/28: bilateral cerebral convexities measuring up to 3 mm in greatest thickness. Appears to predominantly correspond with diffuse pachymeningeal dural thickening and enhancement. However, small bilateral subdural effusions difficult to exclude.  Type of Study: Bedside Swallow Evaluation Previous Swallow Assessment: None Diet Prior to this Study: NPO;NG Tube Temperature Spikes Noted: No Respiratory Status: Trach Collar History of Recent Intubation: Yes Length of Intubations (days): 19 days Date extubated: 07/11/20 Behavior/Cognition: Alert;Cooperative;Pleasant mood Oral Cavity Assessment: Within Functional Limits Oral Care Completed by SLP: No Oral Cavity - Dentition: Adequate natural dentition Vision: Functional for self-feeding Self-Feeding Abilities: Needs assist Carolyn Proctor Positioning: Upright in bed;Postural control adequate for testing Baseline Vocal Quality: Hoarse;Breathy;Low vocal intensity Volitional Cough: Strong Volitional Swallow: Able to elicit    Oral/Motor/Sensory Function Overall Oral Motor/Sensory Function:  (Limited due to pt's difficulty following some commands )   Ice Chips Ice chips: Impaired Presentation: Spoon Pharyngeal Phase Impairments: Throat Clearing - Immediate;Throat Clearing - Delayed   Thin Liquid Thin Liquid: Within functional limits Presentation: Cup;Spoon    Nectar Thick Nectar Thick Liquid: Not tested   Honey Thick Honey Thick Liquid: Not tested   Puree Puree: Impaired Presentation: Spoon Pharyngeal Phase Impairments: Multiple swallows   Solid     Solid: Not tested     Shanika I. Phillips, MS, CCC-SLP Acute Rehabilitation Services Office number [phone] Pager  [phone]  Shanika I Phillips 07/18/2020,12:57 PM       

## 2020-07-18 NOTE — Progress Notes (Signed)
NAME:  Carolyn Proctor, MRN:  [id number], DOB:  1961/03/19, LOS: 32 ADMISSION DATE:  06/22/2020, CONSULTATION DATE:  9/15 REFERRING MD:  Tyrone Nine, CHIEF COMPLAINT:  Confusion, refractory hypoglycemia   Brief History   43 yoF at home who complained to family she felt like she was having a stroke, then developed left facial droop and went unresponsive.  Required BVM by EMS and noted to be hypoglycemic and hypotensive.  Intubated in ER for airway protection.  CTH negative.  Refractory hypoglycemia despite multiple D50 amps and required a drip.  PCCM called to admit.   [facility] hospitalist became primary for patient 10/7 and at that time PCCM began seeing patient once weekly for trach care.  We were called back 10/8 due to change in clinical picture.  Per RT report patient had episode of significant emesis after tracheal suction morning of 10/8.  This resulted in short-term tachypnea and increased supplemental oxygen demand.  On bedside assessment 10/8 patient is comfortably lying in bed on 20% aerosolized trach collar in no acute respiratory distress.  Past Medical History  PAF, DVT, PE on Eliquis HFpEF CKD stage III RA Hypercoagulable state (increased protein S antigen, decreased protein C activity, and decreased Antithrombin III) Centrilobular emphysema and pulmonary fibrosis- non smoker Chronic venous insufficiency w/ chronic foot ulcers  Diverticulitis   Significant Hospital Events   9/15 admitted/ intubated >> pulse dose steroids, diuresis, cardiogenic shock support 9/25 on full vent support, encephalopathy precluding extubation 9/26 not weaning, encephalopathic, dysautonomia, off milrinonoe 10/4 Trach    Consults:  Neurology Advanced heart failure  Procedures:  9/15 ETT >> 9/15 R IJ TL CVC >>9/27 9/27 PICC >  10/4 Trach  Significant Diagnostic Tests:   9/15 CTH >> negative  9/15 CTA chest/ abd/ pelvis >> significant dilatation of RA and RV consistent with R heart failure;  moderate R and small L pleural effusions with associated atelectasis of both lower lobes; underlying chronic cystic and fibrotic lung disease; severe anasarca; nodular contour of liver suggestive of possible cirrhosis  9/15 Echo> LVEF 35-40%, mod decreased LV function, RVSP 75, severely reduced RV systolic function; R to left shunt noted, suggestive of PFO, RA severely dilated, mod pericardial effusion  9/15 CTA head and neck >> no significant carotid or vertebral artery stenosis in neck, no LVO, right jugular central venous catheter with probable surrounding hematoma   9/18 MR BRAIN>> no acute intracranial abnormality  9/28 MRI brain > diffuse pachymeningeal enhancement, bilateral cerebral convexities, sub centimeter extra axial focus of T1 hyperintensity along R temporal occiptal lobe, new  Micro Data:  9/15 BCx2 central line >> NG5D 9/15 BCx2 PIV >> Strept mitis 9/15 SARS2 >> negative 9/15 UC >> ESBL E coli 9/16 Trach asp cult >> Normal flora 9/27 blood > negative 9/27 resp > e coli ESBL  Antimicrobials:  9/15 vanc >> 9/17 9/15 zosyn >> 9/20  9/27 vanc > 9/28 9/27 cefepime > 9/29 9/29 meropenem > 10/5  Interim history/subjective:   'Patient without complaints this morning. Speech has been evaluating the patient with on going risks for dysphagia. Barium swallow has been recommended for further evaluation.  She has 6 cuffed trach in place  Objective   Blood pressure 102/66, pulse 92, temperature 98.2 F (36.8 C), temperature source Axillary, resp. rate (!) 24, height 5\' 11"  (1.803 m), weight 62.3 kg, last menstrual period 02/19/2013, SpO2 98 %.    FiO2 (%):  [28 %] 28 %   Intake/Output Summary (Last 24 hours) at 07/18/2020  [place] filed at 07/17/2020 1944 Gross per 24 hour  Intake --  Output 950 ml  Net -950 ml   Filed Weights   07/16/20 0455 07/17/20 0357 07/18/20 0500  Weight: 63.8 kg 65.2 kg 62.3 kg    Examination: General: Chronically ill appearing frail  deconditioned middle-aged female lying in bed in no acute distress HEENT: 6 cuffed [place] trach midline, MM pink/moist, PERRL,  Neuro: following commands CV: s1s2 regular rate and rhythm, no murmur, rubs, or gallops,  PULM: course breath sounds bilaterally, no acute respiratory distress, trach collar in place GI: soft, bowel sounds active in all 4 quadrants, non-tender, non-distended, tolerating TF Extremities: warm/dry, no edema  Skin: no rashes or lesions  Resolved Hospital Problem list   ESBL E. 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 -Patient has been tolerating ATC well.  P: Continue ATC, currently saturating 100% on 28%, wean FiO2 as able Routine trach care Aggressive pulmonary hygiene Continue prolonged steroid taper, currently prednisone 40 mg daily  Mobilize as able Finishing 14 day course of meropenem for the ESBL E. 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 Ephriam Jenkins with [facility]). - previously treated with rituximab >> last in November 2020 - defer further immunosuppressive therapy while she is being treated for infection P: Supportive care   Rest of acute and chronic medical conditions managed by primary and other consulting specialties   PCCM will continue to follow for trach management 1-2 times per week   Best practice:  Diet: tube feeding DVT prophylaxis: heparin gtt GI prophylaxis: protonix Mobility: bed rest Code Status: full Disposition:  ICU   Signature:  Freda Jackson, MD [place] Office: [phone]   See Amion for Pager Details

## 2020-07-18 NOTE — Care Management (Signed)
Followed up with Carinna with [facility]. They are screening patient and will need to submit for insurance authorization.   Magdalen Spatz RN

## 2020-07-18 NOTE — Progress Notes (Signed)
ANTICOAGULATION CONSULT NOTE - Follow Up Consult  Pharmacy Consult for Heparin  Indication: Protein S defic, hypercoag w/ recent LE DVT and PE (03/2020) + PAF   No Known Allergies  Patient Measurements: Height: 5\' 11"  (180.3 cm) Weight: 62.3 kg (137 lb 5.6 oz) IBW/kg (Calculated) : 70.8 Heparin Dosing Weight: 63.8 kg  Vital Signs: Temp: 98.7 F (37.1 C) (10/11 0727) Temp Source: Axillary (10/11 0727) BP: 110/74 (10/11 0727) Pulse Rate: 88 (10/11 0727)  Labs: Recent Labs    07/15/20 0820 07/15/20 1404 07/16/20 0506 07/16/20 0506 07/17/20 0500 07/17/20 0758 07/18/20 0535 07/18/20 0536  HGB  --   --  10.8*   < > 9.8*  --   --  10.8*  HCT  --   --  35.1*  --  32.3*  --   --  34.3*  PLT  --   --  373  --  425*  --   --  485*  HEPARINUNFRC  --   --  0.30   < > 1.98* 0.25* 0.56  --   CREATININE  --   --  1.08*  --  1.10*  --   --  1.00  TROPONINIHS 103* 86*  --   --   --   --   --   --    < > = values in this interval not displayed.    Estimated Creatinine Clearance: 61 mL/min (by C-G formula based on SCr of 1 mg/dL).   Assessment: Anticoag: Eliquis PTA for hx Protein S defic, hypercoag w/ recent LE DVT and PE (03/2020) + PAF. CTA neg for PE. Apix held 10/1 over w/end for planned trach 10/4 - to start Hep bridge s/p trach, if stable - can go back to Apix - HL 0.56, therapeutic  Goal of Therapy:  Heparin level 0.3-0.7 units/ml Monitor platelets by anticoagulation protocol: Yes   Plan:  - Continue IV heparin at 1450 units/hr - Daily HL and BC - Apixaban on hold since 10/1 PM for trach. F/u restart    Cephus Tupy A. Levada Dy, PharmD, BCPS, FNKF Clinical Pharmacist [place] Please utilize Amion for appropriate phone number to reach the unit pharmacist ([place])   07/18/2020,7:37 AM

## 2020-07-19 ENCOUNTER — Inpatient Hospital Stay (HOSPITAL_COMMUNITY): Payer: BC Managed Care – PPO

## 2020-07-19 DIAGNOSIS — J9600 Acute respiratory failure, unspecified whether with hypoxia or hypercapnia: Secondary | ICD-10-CM | POA: Diagnosis not present

## 2020-07-19 DIAGNOSIS — N170 Acute kidney failure with tubular necrosis: Secondary | ICD-10-CM | POA: Diagnosis not present

## 2020-07-19 DIAGNOSIS — G9340 Encephalopathy, unspecified: Secondary | ICD-10-CM | POA: Diagnosis not present

## 2020-07-19 DIAGNOSIS — R570 Cardiogenic shock: Secondary | ICD-10-CM | POA: Diagnosis not present

## 2020-07-19 LAB — COMPREHENSIVE METABOLIC PANEL
ALT: 37 U/L (ref 0–44)
AST: 33 U/L (ref 15–41)
Albumin: 1.6 g/dL — ABNORMAL LOW (ref 3.5–5.0)
Alkaline Phosphatase: 213 U/L — ABNORMAL HIGH (ref 38–126)
Anion gap: 8 (ref 5–15)
BUN: 41 mg/dL — ABNORMAL HIGH (ref 6–20)
CO2: 25 mmol/L (ref 22–32)
Calcium: 7.7 mg/dL — ABNORMAL LOW (ref 8.9–10.3)
Chloride: 100 mmol/L (ref 98–111)
Creatinine, Ser: 1.04 mg/dL — ABNORMAL HIGH (ref 0.44–1.00)
GFR, Estimated: 60 mL/min — ABNORMAL LOW (ref >60)
Glucose, Bld: 99 mg/dL (ref 70–99)
Potassium: 5 mmol/L (ref 3.5–5.1)
Sodium: 133 mmol/L — ABNORMAL LOW (ref 135–145)
Total Bilirubin: 0.7 mg/dL (ref 0.3–1.2)
Total Protein: 5.4 g/dL — ABNORMAL LOW (ref 6.5–8.1)

## 2020-07-19 LAB — GLUCOSE, CAPILLARY
Glucose-Capillary: 100 mg/dL — ABNORMAL HIGH (ref 70–99)
Glucose-Capillary: 109 mg/dL — ABNORMAL HIGH (ref 70–99)
Glucose-Capillary: 132 mg/dL — ABNORMAL HIGH (ref 70–99)
Glucose-Capillary: 157 mg/dL — ABNORMAL HIGH (ref 70–99)
Glucose-Capillary: 45 mg/dL — ABNORMAL LOW (ref 70–99)
Glucose-Capillary: 98 mg/dL (ref 70–99)

## 2020-07-19 LAB — CBC
HCT: 33.6 % — ABNORMAL LOW (ref 36.0–46.0)
Hemoglobin: 10.1 g/dL — ABNORMAL LOW (ref 12.0–15.0)
MCH: 31.7 pg (ref 26.0–34.0)
MCHC: 30.1 g/dL (ref 30.0–36.0)
MCV: 105.3 fL — ABNORMAL HIGH (ref 80.0–100.0)
Platelets: 512 10*3/uL — ABNORMAL HIGH (ref 150–400)
RBC: 3.19 MIL/uL — ABNORMAL LOW (ref 3.87–5.11)
RDW: 19.7 % — ABNORMAL HIGH (ref 11.5–15.5)
WBC: 15.7 10*3/uL — ABNORMAL HIGH (ref 4.0–10.5)
nRBC: 1.1 % — ABNORMAL HIGH (ref 0.0–0.2)

## 2020-07-19 LAB — CULTURE, BLOOD (ROUTINE X 2)
Culture: NO GROWTH
Culture: NO GROWTH
Special Requests: ADEQUATE
Special Requests: ADEQUATE

## 2020-07-19 LAB — PHOSPHORUS: Phosphorus: 3.1 mg/dL (ref 2.5–4.6)

## 2020-07-19 LAB — MAGNESIUM: Magnesium: 1.9 mg/dL (ref 1.7–2.4)

## 2020-07-19 LAB — HEPARIN LEVEL (UNFRACTIONATED): Heparin Unfractionated: 0.63 IU/mL (ref 0.30–0.70)

## 2020-07-19 MED ORDER — FUROSEMIDE 20 MG PO TABS
20.0000 mg | ORAL_TABLET | Freq: Every day | ORAL | Status: DC
  Administered 2020-07-19 – 2020-07-26 (×8): 20 mg
  Filled 2020-07-19 (×7): qty 1

## 2020-07-19 MED ORDER — INSULIN ASPART 100 UNIT/ML ~~LOC~~ SOLN
0.0000 [IU] | SUBCUTANEOUS | Status: DC
  Administered 2020-07-19: 2 [IU] via SUBCUTANEOUS
  Administered 2020-07-20: 5 [IU] via SUBCUTANEOUS
  Administered 2020-07-21 (×2): 2 [IU] via SUBCUTANEOUS
  Administered 2020-07-22: 1 [IU] via SUBCUTANEOUS
  Administered 2020-07-22: 5 [IU] via SUBCUTANEOUS
  Administered 2020-07-23: 1 [IU] via SUBCUTANEOUS
  Administered 2020-07-23: 5 [IU] via SUBCUTANEOUS
  Administered 2020-07-23 – 2020-07-24 (×2): 2 [IU] via SUBCUTANEOUS
  Administered 2020-07-24: 1 [IU] via SUBCUTANEOUS
  Administered 2020-07-24: 5 [IU] via SUBCUTANEOUS
  Administered 2020-07-25: 2 [IU] via SUBCUTANEOUS
  Administered 2020-07-25: 3 [IU] via SUBCUTANEOUS
  Administered 2020-07-26: 1 [IU] via SUBCUTANEOUS
  Administered 2020-07-26: 3 [IU] via SUBCUTANEOUS
  Administered 2020-07-26 (×2): 1 [IU] via SUBCUTANEOUS

## 2020-07-19 NOTE — Progress Notes (Signed)
ANTICOAGULATION CONSULT NOTE - Follow Up Consult  Pharmacy Consult for Heparin  Indication: Protein S defic, hypercoag w/ recent LE DVT and PE (03/2020) + PAF   No Known Allergies  Patient Measurements: Height: 5\' 11"  (180.3 cm) Weight: 62.5 kg (137 lb 12.6 oz) IBW/kg (Calculated) : 70.8 Heparin Dosing Weight: 63.8 kg  Vital Signs: Temp: 98.3 F (36.8 C) (10/12 0329) Temp Source: Axillary (10/12 0329) BP: 104/61 (10/12 0600) Pulse Rate: 89 (10/12 0600)  Labs: Recent Labs    07/17/20 0500 07/17/20 0500 07/17/20 0758 07/18/20 0535 07/18/20 0536 07/19/20 0358 07/19/20 0359  HGB 9.8*   < >  --   --  10.8* 10.1*  --   HCT 32.3*  --   --   --  34.3* 33.6*  --   PLT 425*  --   --   --  485* 512*  --   HEPARINUNFRC 1.98*   < > 0.25* 0.56  --   --  0.63  CREATININE 1.10*  --   --   --  1.00  --   --    < > = values in this interval not displayed.    Estimated Creatinine Clearance: 61.2 mL/min (by C-G formula based on SCr of 1 mg/dL).   Assessment: Anticoag: Eliquis PTA for hx Protein S defic, hypercoag w/ recent LE DVT and PE (03/2020) + PAF. CTA neg for PE. Apix held 10/1 over w/end for planned trach 10/4 - to start Hep bridge s/p trach, if stable - can go back to Apix - HL 0.63, therapeutic, Hgb 10.1 (stable)  Goal of Therapy:  Heparin level 0.3-0.7 units/ml Monitor platelets by anticoagulation protocol: Yes   Plan:  - Continue IV heparin at 1450 units/hr - Daily HL and BC - Apixaban on hold since 10/1 PM for trach. F/u restart    Jameelah Watts A. Levada Dy, PharmD, BCPS, FNKF Clinical Pharmacist [place] Please utilize Amion for appropriate phone number to reach the unit pharmacist ([place])   07/19/2020,7:49 AM

## 2020-07-19 NOTE — Progress Notes (Signed)
PROGRESS NOTE    Carolyn Proctor  QPY:[id number] DOB: 02-Jan-1961 DOA: 06/22/2020 PCP: Lilian Coma., MD   Brief Narrative:  Patient is a 59 year old chronically ill-appearing African-American female with a past medical history significant for proximal atrial fibrillation, history of DVT, history of PE anticoagulated Eliquis, history of heart failure with preserved ejection fraction, history of CKD stage III, history of rheumatoid arthritis, hypercoagulable state including increased protein S antigen, decreased protein C activity and decreased Antithrombin III, history of centrilobular emphysema and pulmonary fibrosis, history of chronic venous insufficiency with chronic foot ulcers, history of diverticulitis, history of mixed connective tissue disease and other comorbidities who felt like she was having a stroke and then developed a left facial droop and went unresponsive.  She subsequently required bag valve mask by EMS and was noted to be hypoglycemic and hypotensive.  She was intubated in the ED for airway protection and head CT was done which was negative.  And because she had refractory hypoglycemia despite multiple D 50 a.m. she was requiring a drip.  PCCM was called to admit for this patient.  She is admitted on 06/22/2020 and initiated on pulse dose steroids and diuresis given that she was in cardiogenic shock.  On 07/02/2020 she was on full ventilatory support and was encephalopathic precluding extubation.  926 she is but not weaning and encephalopathic with dysautonomia and off amiodarone drip and subsequently tracheostomy was placed on 07/11/2020.  Further work-up revealed that she had acute hypoxic respiratory failure with inability to protect her airway in the setting of hypoglycemia and subsequently developing HCAP with an ESBL E. coli and she has interstitial lung disease with connective tissue disease with acute pneumonitis and pulmonary edema.  She was also found to be subsequently an acute  systolic CHF and advanced heart failure team was consulted.  During her hospitalization she also developed atrial flutter with atrial tachycardia runs of nonsustained ventricular tachycardia.  Subsequently she has been placed on many antibiotics and escalated to meropenem given her ESBL.  She has now been on trach collar and she is transferred to [facility] service on 07/14/2020 after a prolonged ICU stay.  Today she spiked a temperature of 102 and became tachycardic and tachypneic.  We will panculture her again and resume antibiotics with IV meropenem. Pulmonary still will be following for trach care.  07/15/2020: She developed emesis this morning as well as chest pain and was given antimedics.  Pulmonary was reconsulted given her worsening respiratory status but she is stabilized.  Family is interested in [place] [facility] [place].  07/16/2020: Temperature curve is improving.  PT OT recommending LTACH.  Also get speech therapy to see the patient cannot use PMV with speech therapy.  SLP not evaluated given fatigue.  E[facility] status appears stable compared to yesterday.  07/17/2020: Her temperature has resolved and her WBC was daily.  Respiratory status is relatively stable and she is not as rhonchorous.  Patient thinks that she is doing better today and will continue SLP efforts.  Renal function remains stable but her sodium did drop slightly likely in setting of diuretics.  We will also need to continue to monitor hemoglobin hematocrit given that she is anticoagulated with heparin drip.  07/18/2020: Cardiology evaluated and stopping her IV Lasix today and starting p.o. Lasix 20 mg p.o. daily tomorrow.  Cardiology feels that her right heart cath would not change management at this point and I would not follow peripherally and consider right heart cath at that time if she  improves.  Shortness of breath has improved.  PT still recommending LTAC.  SLP working with the patient with the [facility] and they continue to recommend n.p.o. at  this time and then MBS be conducted to further assess physiology.  Continue her core track tube feedings.  [facility] screen the patient and want to submit short authorization.  Pulmonary evaluated and recommending continuing the #6 cuffed trach in place for swallow evaluation and mobilizing.  We will continue antibiotics for 14 days.  07/19/2020: Patient is doing much better today and underwent an MBS which recommended a dysphagia 1 pure diet with thin liquids.  If she is able to tolerate this with her competition strategies we can discontinue her core track tube feedings and change her IV heparin to p.o. Eliquis.  She was hypoglycemic overnight so we have adjusted her insulin scale.  LTAC recommending that there is not to me a bed available until next week but insurance authorization will start tomorrow and she is currently not eligible for SNF placement 08/11/2020.   Assessment & Plan:   Active Problems:   Pulmonary HTN (HCC)   Acute encephalopathy   Acute respiratory failure (HCC)   Oxygen desaturation   Cardiogenic shock (HCC)   Acute renal failure with tubular necrosis (HCC)   RVF (right ventricular failure) (HCC)   E. 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 - patient doing well on minimal ventilation settings, trach collar trial - completed meropenem abx treatment because she spiked another temperature will resume for now and likely prolonged treatment and continue for 14 days total; WBC is improving daily and it is now 14.8 - continue prednisone 40 mg daily for acute pneumonitis in setting of ILD, consider slow steroid taper - Follow CXR -[place] on trach collar now -SpO2: 96 % O2 Flow Rate (L/min): 5 L/min FiO2 (%):  28 %; she was on 8 L yesterday and is improving -Repeat chest x-ray this AM pending formal read. and repeat chest x-ray today showed "Stable position of support apparatus.  No pneumothorax. Improved aeration of the lungs with minimal residual bilateral interstitial opacities with basilar predominance nonspecific with differential considerations including asymmetric pulmonary edema versus pulmonary fibrosis." -Dr. Lake Bells discussed with her outpatient rheumatologist and they feel that this is a vasculitis picture and given her underlying infections and they cannot use more aggressive immunosuppression other than prednisone at this time -Continue with breathing treatment and continue with Xopenex/Atrovent -Patient had a emesis episode and likely aspirated PCCM was reconsulted -Recommending continuing ATC as the patient was currently saturating 100% on 20% and wean FiO2 as able.  They are recommending routine trach care as well as as needed antiemetics and if emesis recurs they are recommending to see if Cortrak can  -Will consult PT/OT/SLP and OOB to Mobilize as Able; they are recommending LTAC -Will follow CXR in AM and repeat ABG intermittently  -Recommend that if she has further respiratory complications to reconsult and call again. -LTAC looking at the patient and [facility] is the preference; appreciate case management assistance in placement of an LTAC and they are screening the patient and will submit insurance authorization tomorrow as LTAC will have a bed early next week  Fever and SIRS, improving  -Temperature of 102 on  07/24/20 and was tachycardic and tachypneic -Currently no clear source of infection and she had just completed treatment for HCAP with ESBL E Coli but this is likely the source and she will likely need a prolonged antibiotic course totaling 14 days per PCCM recommendations -likely this is in the setting of her HCAP pneumonia -When she did spike her temperature a few days ago  she was pancultured -chest x-ray was as above  -urinalysis showed a cloudy appearance with large hemoglobin, large leukocytes, rare bacteria, greater than 50 RBCs per high-power field, greater than 50 WBCs but urine culture showed no growth  -Blood cultures x2 showed NGTD at 4 Days -Resumed IV meropenem and will continue for total 14 days -follow cultures and data monitor temperature curve and WBC  -WBC remains elevated but is now trending down to 16.8 -> 15.4 -> 14.8 and slightly trended up to 16.9 but remains relatively stable and back down to 15.7  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 Ephriam Jenkins with [facility]). - previously treated with rituximab >> last in November 2020 - defer further immunosuppressive therapy while she is being treated for infection -She will need close rheumatology follow-up at discharge.   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 Atrial flutter/atrial tachycardia with runs of NSVT - new this admission. - advanced heart failure team following intermittently; initially she was in cardiogenic shock due to right ventricular failure/cor pulmonale and echo was repeated and she is improved with milrinone and diuresis -Sequently developed septic shock with ESBL E. coli and was changed from cefepime to meropenem - continue amiodarone 200 mg daily -Cardiology recommending that she needs a right heart catheter currently not a candidate and they are not to pursue this on Friday and Dr. Haroldine Laws feels that she needs to be more stable before even attempts of right heart cath -Patient is +2.551 L since admission -Continues to get IV Lasix 20 mg daily and this is due to be stopped today and she will be changed to p.o. Lasix 20 mg daily this morning by cardiology -Cardiology recommends that a right heart cath would not change management at this  point and they feel that she needs to be more stable and they can pursue this later on call cardiology feels there are no further interventions from their standpoint and there I want to follow peripherally -Patient remains on a heparin drip and if she is safely able to swallow we will change her back to Eliquis we will watch her 1 more day on the heparin drip as MBS is recommended dysphagia 1 diet  Acute metabolic encephalopathy from hypoglycemia, hypotension, hypoxia. Seizure in setting of hypoxia with respiratory failure. 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 -  Continue heparin gtt, consider bridging back to eliquis as she is diagnosed back in June of this year  Bilateral Pressure Ulcers - Wound Consult - BID Dressing Change w/ silvadene  Hypernatremia (Resolved) she is now on hyponatremic -Free water not been discontinued, D5W at 25 mL's per hour will now be discontinued -Sodium is now 139 and trending down slowly went to 136 yesterday and today is 128 yesterday and today it is 133 again -We will stop D5W and may need to watch carefully given  that she is on Lasix 20 mg IV daily and will be changed to p.o. today -Continue monitor carefully and repeat CMP in the a.m.  Hyperkalemia -Mild and slightly worsened yesterday but is improved to 5.1 today -In the setting of IV potassium replacement -Potassium was 5.2 -> 5.4 -> 5.1 -> 5.4 and today is 5.0 -She was given Lokelma 10 mg x1 on 07/16/20 and another dose 07/18/2020 -Likely to improve in the setting of diuresis -Continue to monitor and trend repeat CMP in the a.m.  Liver cirrhosis Abnormal LFTs, mildly elevated on last check - f/u LFTs intermittently and will repeat CMP in the a.m; LFTs are improving and AST is now 33 and ALT remains slightly elevated at  37 -We will continue monitor and trend -continue lactulose, titrate~200 mL of stool per rectal tube  Steroid Induced Hyperglycemia. - SSI with moderate NovoLog sliding scale insulin every 4 hours change a sensitive NovoLog sign scale insulin every 4 hours but will continue 3 units subcu every 4 hours scheduled -CBGs ranging from 45-1 32  Nausea and Emesis, improved -Likely aspirated but will continue with antiemetics and started on IV ondansetron -No further emesis now -C/w Supportive Care  Macrocytic Anemia -Patient's Hgb/Hct went from 11.2/35.9 -> 11.0/35.3 -> 10.8/35.1 and is slowly trending downward to 9.8/32.3 and is now back up to 10.1/33.6 -Checked Anemia Panel and showed an iron level of 52, U IBC of 172, TIBC of 224, saturation ratios of 23%, ferritin level of 650, folate level of 8.1, and vitamin B12 level 588 -Continue to Monitor for S/Sx of Bleeding; No overt bleeding noted -Repeat CBC in the AM   Chest Pain, rule out ACS;chest pain has completely resolved -Chest pain a few days ago which is now completely resolved -EKG was relatively unremarkable and nonischemic; -Troponin was initially elevated likely is demand ischemia in the setting of her nausea vomiting and her current comorbidities -Troponin levels 103 and trended down to 86 -Added nitroglycerin, morphine and she has chest pain now when she coughs  Thrombocytosis -Likely reactive -Patient's platelet count from 373 is now 512 -Continue to monitor and trend and repeat CBC in a.m.  Severe Protein Calorie Malnutrition. -Nutritionist consulted and she is on Tube feeds with Osmolite 1.5 at 60 mils per hour, 45 mill liters of Prosource tube feeding daily, 150 MLof free water every 4 -Swallow screen done and MBS done today they are recommending a dysphagia 1 diet with thin liquids -If she is able to tolerate her diet and we can successfully change her from her heparin drip back to Eliquis as likely she will not need a  PEG then  DVT prophylaxis: Anticoagulated with a heparin drip Code Status: FULL CODE  Family Communication: Discussed with Aunt at bedside  Disposition Plan: Pending further improvement of her clinical status and resolution of her fevers.  Will need clearance by pulmonary as well as cardiology and may require an inpatient right heart cath if she improves; cardiology now signed off and pulmonary following.  She needs LTAC placement and bed availability will not be until next week  Status is: Inpatient  Remains inpatient appropriate because:Ongoing diagnostic testing needed not appropriate for outpatient work up, Unsafe d/c plan, IV treatments appropriate due to intensity of illness or inability to take PO and Inpatient level of care appropriate due to severity of illness   Dispo: The patient is from: Home              Anticipated d/c is to:  LTAC              Anticipated d/c date is: > 3 days              Patient currently is not medically stable to d/c.  Consultants:   PCCM Transfer  Cardiology  Neurology  Procedures:  9/15 ETT >> 9/15 R IJ TL CVC >>9/27 9/27 PICC >  10/4 Trach  Significant Diagnostic Tests:   9/15 CTH >> negative  9/15 CTA chest/ abd/ pelvis >> significant dilatation of RA and RV consistent with R heart failure; moderate R and small L pleural effusions with associated atelectasis of both lower lobes; underlying chronic cystic and fibrotic lung disease; severe anasarca; nodular contour of liver suggestive of possible cirrhosis  9/15 Echo> LVEF 35-40%, mod decreased LV function, RVSP 75, severely reduced RV systolic function; R to left shunt noted, suggestive of PFO, RA severely dilated, mod pericardial effusion  9/15 CTA head and neck >> no significant carotid or vertebral artery stenosis in neck, no LVO, right jugular central venous catheter with probable surrounding hematoma  9/18 MR BRAIN>> no acute intracranial abnormality  9/28 MRI brain > diffuse  pachymeningeal enhancement, bilateral cerebral convexities, sub centimeter extra axial focus of T1 hyperintensity along R temporal occiptal lobe, new  Micro Data:  9/15 BCx2 central line >> NG5D 9/15 BCx2 PIV >> Strept mitis 9/15 SARS2 >> negative 9/15 UC >> ESBL E coli 9/16 Trach asp cult >> Normal flora 9/27 blood > negative 9/27 resp > e coli ESBL  Antimicrobials:  9/15 vanc >> 9/17 9/15 zosyn >> 9/20  9/27 vanc > 9/28 9/27 cefepime > 9/29 9/29 meropenem > 10/5 and resumed on 10/6 >  Antimicrobials:  Anti-infectives (From admission, onward)   Start     Dose/Rate Route Frequency Ordered Stop   07/14/20 1400  meropenem (MERREM) 2 g in sodium chloride 0.9 % 100 mL IVPB        2 g 200 mL/hr over 30 Minutes Intravenous Every 8 hours 07/14/20 1339     07/06/20 1400  meropenem (MERREM) 2 g in sodium chloride 0.9 % 100 mL IVPB        2 g 200 mL/hr over 30 Minutes Intravenous Every 8 hours 07/06/20 1124 07/12/20 2230   07/05/20 1000  vancomycin (VANCOREADY) IVPB 500 mg/100 mL  Status:  Discontinued        500 mg 100 mL/hr over 60 Minutes Intravenous Every 12 hours 07/04/20 2127 07/05/20 0902   07/04/20 2200  ceFEPIme (MAXIPIME) 2 g in sodium chloride 0.9 % 100 mL IVPB  Status:  Discontinued        2 g 200 mL/hr over 30 Minutes Intravenous Every 12 hours 07/04/20 0929 07/06/20 1124   07/04/20 2200  vancomycin (VANCOREADY) IVPB 500 mg/100 mL  Status:  Discontinued        500 mg 100 mL/hr over 60 Minutes Intravenous Every 12 hours 07/04/20 1149 07/04/20 2127   07/04/20 2200  vancomycin (VANCOCIN) IVPB 1000 mg/200 mL premix        1,000 mg 200 mL/hr over 60 Minutes Intravenous  Once 07/04/20 2127 07/05/20 0032   07/04/20 0930  vancomycin (VANCOCIN) IVPB 1000 mg/200 mL premix  Status:  Discontinued        1,000 mg 200 mL/hr over 60 Minutes Intravenous  Once 07/04/20 0929 07/04/20 2127   07/04/20 0930  vancomycin (VANCOREADY) IVPB 500 mg/100 mL  Status:  Discontinued        500  mg 100 mL/hr over 60 Minutes Intravenous Every 12 hours 07/04/20 0929 07/04/20 1149   06/23/20 1015  vancomycin (VANCOREADY) IVPB 750 mg/150 mL  Status:  Discontinued        750 mg 150 mL/hr over 60 Minutes Intravenous Every 12 hours 06/23/20 1007 06/24/20 0849   06/23/20 1000  piperacillin-tazobactam (ZOSYN) IVPB 3.375 g        3.375 g 12.5 mL/hr over 240 Minutes Intravenous Every 8 hours 06/23/20 0948 06/28/20 0805   06/22/20 1230  vancomycin (VANCOREADY) IVPB 1250 mg/250 mL        1,250 mg 166.7 mL/hr over 90 Minutes Intravenous  Once 06/22/20 1226 06/22/20 1535   06/22/20 1230  piperacillin-tazobactam (ZOSYN) IVPB 3.375 g        3.375 g 100 mL/hr over 30 Minutes Intravenous  Once 06/22/20 1226 06/22/20 1502       Subjective: Seen and examined at bedside and feels like she is doing better today.  Had no complaints except that her right hand was hurting.  No nausea or vomiting.  Thinks her respiratory status is doing better.  Underwent a swallow screen today.  No other concerns or complaints at this time.  Objective: Vitals:   07/19/20 0400 07/19/20 0600 07/19/20 0817 07/19/20 1127  BP:  104/61    Pulse: 88 89 89 88  Resp:  '16 20 20  ' Temp:      TempSrc:      SpO2: 95% 97% 98% 96%  Weight:      Height:        Intake/Output Summary (Last 24 hours) at 07/19/2020 1604 Last data filed at 07/19/2020 8416 Gross per 24 hour  Intake 9402 ml  Output 3650 ml  Net 5752 ml   Filed Weights   07/17/20 0357 07/18/20 0500 07/19/20 0329  Weight: 65.2 kg 62.3 kg 62.5 kg   Examination: Physical Exam:  Constitutional: Patient is a thin chronically ill-appearing African-American female currently in no acute distress and looks much more comfortable today than she did yesterday Eyes: Lids and conjunctivae normal, sclerae anicteric  ENMT: External Ears, Nose appear normal. Grossly normal hearing.  Neck: Has a cuffed tracheostomy in place connected to ATC Respiratory: Diminished to  auscultation bilaterally with coarse breath sounds and some slight rhonchi at the bases.,  No appreciable wheezing, rales, rhonchi.  Slightly increased respiratory effort but has unlabored breathing and connected to supplemental oxygen via nasal cannula Cardiovascular: RRR, no murmurs / rubs / gallops. S1 and S2 auscultated.  No appreciable lower extremity edema Abdomen: Soft, non-tender, non-distended. Bowel sounds positive.  GU: Deferred. Musculoskeletal: No clubbing / cyanosis of digits/nails. No joint deformity upper and lower extremities.  Right hand has a deformity in her fingers Skin: Bilateral pressure ulcers and legs are in Prevalon boots. No induration; Warm and dry.  Neurologic: CN 2-12 grossly intact with no focal deficits. Romberg sign and cerebellar reflexes not assessed.  Psychiatric: Normal judgment and insight. Alert and oriented x 3. Normal mood and appropriate affect.   Data Reviewed: I have personally reviewed following labs and imaging studies  CBC: Recent Labs  Lab 07/15/20 0517 07/16/20 0506 07/17/20 0500 07/18/20 0536 07/19/20 0358  WBC 16.8* 15.4* 14.8* 16.9* 15.7*  NEUTROABS 14.7* 13.3* 11.9* 13.4*  --   HGB 11.0* 10.8* 9.8* 10.8* 10.1*  HCT 35.3* 35.1* 32.3* 34.3* 33.6*  MCV 106.6* 106.0* 106.3* 106.5* 105.3*  PLT 314 373 425* 485* 606*   Basic Metabolic Panel: Recent Labs  Lab 07/15/20 0517  07/16/20 0506 07/17/20 0500 07/18/20 0536 07/19/20 1117  NA 139 136 128* 133* 133*  K 5.2* 5.4* 5.1 5.4* 5.0  CL 110 105 96* 100 100  CO2 19* '24 23 26 25  ' GLUCOSE 138* 162* 257* 115* 99  BUN 47* 39* 39* 41* 41*  CREATININE 1.17* 1.08* 1.10* 1.00 1.04*  CALCIUM 7.9* 8.2* 7.7* 8.0* 7.7*  MG 2.0 1.9 2.0 2.0 1.9  PHOS 2.7 3.4 3.1 3.7 3.1   GFR: Estimated Creatinine Clearance: 58.9 mL/min (A) (by C-G formula based on SCr of 1.04 mg/dL (H)). Liver Function Tests: Recent Labs  Lab 07/15/20 0517 07/16/20 0506 07/17/20 0500 07/18/20 0536 07/19/20 1117  AST  45* 33 36 30 33  ALT 67* 53* 53* 39 37  ALKPHOS 210* 199* 205* 211* 213*  BILITOT 0.7 0.9 0.8 0.7 0.7  PROT 5.3* 5.7* 5.0* 5.4* 5.4*  ALBUMIN 1.5* 1.5* 1.4* 1.4* 1.6*   No results for input(s): LIPASE, AMYLASE in the last 168 hours. No results for input(s): AMMONIA in the last 168 hours. Coagulation Profile: No results for input(s): INR, PROTIME in the last 168 hours. Cardiac Enzymes: No results for input(s): CKTOTAL, CKMB, CKMBINDEX, TROPONINI in the last 168 hours. BNP (last 3 results) No results for input(s): PROBNP in the last 8760 hours. HbA1C: No results for input(s): HGBA1C in the last 72 hours. CBG: Recent Labs  Lab 07/18/20 2324 07/18/20 2334 07/19/20 0327 07/19/20 0736 07/19/20 1153  GLUCAP 45* 78 109* 100* 132*   Lipid Profile: No results for input(s): CHOL, HDL, LDLCALC, TRIG, CHOLHDL, LDLDIRECT in the last 72 hours. Thyroid Function Tests: No results for input(s): TSH, T4TOTAL, FREET4, T3FREE, THYROIDAB in the last 72 hours. Anemia Panel: Recent Labs    07/18/20 0534 07/18/20 0536  VITAMINB12  --  588  FOLATE 8.1  --   FERRITIN  --  650*  TIBC  --  224*  IRON  --  52  RETICCTPCT  --  5.3*   Sepsis Labs: No results for input(s): PROCALCITON, LATICACIDVEN in the last 168 hours.  Recent Results (from the past 240 hour(s))  Culture, Urine     Status: None   Collection Time: 07/14/20  1:15 PM   Specimen: Urine, Random  Result Value Ref Range Status   Specimen Description URINE, RANDOM  Final   Special Requests NONE  Final   Culture   Final    NO GROWTH Performed at [facility], [address] [place], [place] [address]    Report Status 07/15/2020 FINAL  Final  Culture, blood (routine x 2)     Status: None   Collection Time: 07/14/20  2:06 PM   Specimen: BLOOD LEFT ARM  Result Value Ref Range Status   Specimen Description BLOOD LEFT ARM  Final   Special Requests   Final    BOTTLES DRAWN AEROBIC ONLY Blood Culture adequate volume    Culture   Final    NO GROWTH 5 DAYS Performed at [facility], [place] [address] [place], [place] [address]    Report Status 07/19/2020 FINAL  Final  Culture, blood (routine x 2)     Status: None   Collection Time: 07/14/20  2:10 PM   Specimen: BLOOD LEFT HAND  Result Value Ref Range Status   Specimen Description BLOOD LEFT HAND  Final   Special Requests   Final    BOTTLES DRAWN AEROBIC ONLY Blood Culture adequate volume   Culture   Final  NO GROWTH 5 DAYS Performed at [facility], [place] [address] [place], [place] [address]    Report Status 07/19/2020 FINAL  Final     RN Pressure Injury Documentation:     Estimated body mass index is 19.22 kg/m as calculated from the following:   Height as of this encounter: '5\' 11"'  (1.803 m).   Weight as of this encounter: 62.5 kg.  Malnutrition Type:  Nutrition Problem: Severe Malnutrition Etiology: chronic illness   Malnutrition Characteristics:  Signs/Symptoms: severe muscle depletion, energy intake < or equal to 75% for > or equal to 1 month, moderate fat depletion, edema   Nutrition Interventions:  Interventions: Tube feeding, Prostat   Radiology Studies: DG CHEST PORT 1 VIEW  Result Date: 07/18/2020 CLINICAL DATA:  Shortness of breath. Pneumonia. Additional history provided by technologist: Tracheostomy, pneumonia, respiratory arrest. EXAM: PORTABLE CHEST 1 VIEW COMPARISON:  Prior chest radiographs 07/17/2020 and earlier. FINDINGS: Unchanged position of a tracheostomy tube and right-sided PICC. An enteric tube passes below level of left hemidiaphragm with tip excluded from the field of view. Unchanged cardiomegaly. Subtle new patchy airspace disease is questioned within the left mid to upper lung field. Stable background chronic interstitial prominence. No definite pleural effusion. No evidence of pneumothorax. No acute bony abnormality identified. IMPRESSION: Stable position of visible support apparatus as described.  Subtle new patchy airspace disease is questioned within the left mid to upper lung field. Consider short interval radiographic follow-up. Unchanged cardiomegaly. Stable background chronic prominence of the interstitial lung markings. Electronically Signed   By: Kellie Simmering DO   On: 07/18/2020 08:18   DG Swallowing Func-Speech Pathology  Result Date: 07/19/2020 Objective Swallowing Evaluation: Type of Study: MBS-Modified Barium Swallow Study  Patient Details Name: Carolyn Proctor MRN: [id number] Date of Birth: 1961-03-26 Today's Date: 07/19/2020 Time: SLP Start Time (ACUTE ONLY): 1020 -SLP Stop Time (ACUTE ONLY): 1045 SLP Time Calculation (min) (ACUTE ONLY): 25 min Past Medical History: Past Medical History: Diagnosis Date . Acute myopericarditis 02/24/2013 . Atrial fibrillation ([place])  . Atrial fibrillation with RVR ([place]) 02/22/2013 . Chronic anticoagulation, Xarelto 04/02/2013 . Chronic diastolic CHF (congestive heart failure) ([place]) 06/26/2019 . Diverticulitis  . Elevated troponin- secondary to Myopericarditis 02/25/2013 . Emphysema  07/07/2019 . Hypertension  . Lupus ([place]) 09/14/2011 . 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 PAF (paroxysmal atrial fibrillation) ([place]) 04/02/2013 . Pleuritic chest pain 02/22/2013 . 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 Rheumatoid arthritis ([place]) 07/07/2019 . 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 Right ventricular dysfunction 08/05/2019 . Thrombocytosis ([place]) 09/14/2011 Past Surgical History: Past Surgical History: Procedure Laterality Date . BONE MARROW BIOPSY  2010 . CARDIAC CATHETERIZATION   . LEFT HEART CATHETERIZATION WITH CORONARY ANGIOGRAM N/A 02/24/2013  Procedure: LEFT HEART CATHETERIZATION WITH CORONARY ANGIOGRAM;  Surgeon: Leonie Man, MD;  Location: [facility] CATH LAB;  Service: Cardiovascular;  Laterality: N/A; . RIGHT/LEFT HEART CATH AND  CORONARY ANGIOGRAPHY N/A 09/09/2019  Procedure: RIGHT/LEFT HEART CATH AND CORONARY ANGIOGRAPHY;  Surgeon: Jettie Booze, MD;  Location: [facility] CV LAB;  Service: Cardiovascular;  Laterality: N/A; HPI:  Patient is a 59 year old female. She presented to the hospital with reports of stroke like, left facial droop and unresponsiveness. She has had a complicated hospital stay which included intubation from 9/15-10/4 with trach placed on 10/4.  PMH: lupus, a-fib, CHF, tachycardia, thrombocytosis, right  ventricular dysfunction, rheumatoid lung disease, RA. CT hyead and MRI 9/15 and 9/18 were negative for acute changes. MRI brain 9/28: bilateral cerebral convexities measuring up to 3 mm in greatest thickness. Appears to predominantly correspond with diffuse pachymeningeal dural thickening and enhancement. However, small bilateral subdural effusions difficult to exclude.  Subjective: Pt was lethargic with daughter at bedside Assessment / Plan / Recommendation CHL IP CLINICAL IMPRESSIONS 07/19/2020 Clinical Impression Pt was seen for MBS, which revealed moderate oropharyngeal dysphagia as characterized by delayed oral transit, premature spillage, delayed swallow initiation, and moderate pharyngeal residue. Pt wore PMV during instrumental and tolerated it well, however, her vocal quality is dysphonic and low in volume. Delayed oral transit occurred across POs but most significantly with solids. She also demonstrated some lingual pumping with solids. Premature spillage fell to the level of at least the valleculae across POs, and to the level of the pyriforms with thin liquid. A delayed swallow initiation was observed across POs, but most significantly with solids. She demonstrated one instance of trace aspiration with thin liquid. No other instances of aspiration were observed, even with 3 oz of thin liquid. Moderate residue in the valleculae and pyriforms was observed across POs. When prompted to produce an  additional dry swallow after the PO, she successfully cleared most of the residue. SLP also attempted a chin tuck strategy with patient and was successful in reducing some residue. Recommend dys 1 diet and thin liquid with use of chin tuck and double swallow. SLP will f/u acutely for pt education of strategies and diet tolerance/advancement.  SLP Visit Diagnosis Dysphagia, oropharyngeal phase (R13.12) Attention and concentration deficit following -- Frontal lobe and executive function deficit following -- Impact on safety and function Moderate aspiration risk   CHL IP TREATMENT RECOMMENDATION 07/19/2020 Treatment Recommendations Therapy as outlined in treatment plan below   Prognosis 07/19/2020 Prognosis for Safe Diet Advancement Fair Barriers to Reach Goals Severity of deficits Barriers/Prognosis Comment -- CHL IP DIET RECOMMENDATION 07/19/2020 SLP Diet Recommendations Dysphagia 1 (Puree) solids;Thin liquid Liquid Administration via Straw Medication Administration Crushed with puree Compensations Chin tuck;Multiple dry swallows after each bite/sip;Slow rate;Small sips/bites Postural Changes Seated upright at 90 degrees   CHL IP OTHER RECOMMENDATIONS 07/19/2020 Recommended Consults -- Oral Care Recommendations Oral care BID Other Recommendations --   CHL IP FOLLOW UP RECOMMENDATIONS 07/19/2020 Follow up Recommendations LTACH   CHL IP FREQUENCY AND DURATION 07/19/2020 Speech Therapy Frequency (ACUTE ONLY) min 2x/week Treatment Duration 2 weeks      CHL IP ORAL PHASE 07/19/2020 Oral Phase Impaired Oral - Pudding Teaspoon -- Oral - Pudding Cup -- Oral - Honey Teaspoon -- Oral - Honey Cup -- Oral - Nectar Teaspoon -- Oral - Nectar Cup -- Oral - Nectar Straw Premature spillage;Delayed oral transit Oral - Thin Teaspoon -- Oral - Thin Cup -- Oral - Thin Straw Premature spillage;Delayed oral transit Oral - Puree Premature spillage;Delayed oral transit Oral - Mech Soft -- Oral - Regular Premature spillage;Delayed oral  transit;Lingual pumping Oral - Multi-Consistency -- Oral - Pill -- Oral Phase - Comment --  CHL IP PHARYNGEAL PHASE 07/19/2020 Pharyngeal Phase Impaired Pharyngeal- Pudding Teaspoon -- Pharyngeal -- Pharyngeal- Pudding Cup -- Pharyngeal -- Pharyngeal- Honey Teaspoon -- Pharyngeal -- Pharyngeal- Honey Cup -- Pharyngeal -- Pharyngeal- Nectar Teaspoon -- Pharyngeal -- Pharyngeal- Nectar Cup -- Pharyngeal -- Pharyngeal- Nectar Straw Delayed swallow initiation-vallecula;Pharyngeal residue - valleculae;Pharyngeal residue - pyriform Pharyngeal -- Pharyngeal- Thin Teaspoon -- Pharyngeal -- Pharyngeal- Thin Cup -- Pharyngeal -- Pharyngeal- Thin Straw  Delayed swallow initiation-vallecula;Delayed swallow initiation-pyriform sinuses;Pharyngeal residue - valleculae;Pharyngeal residue - pyriform;Trace aspiration;Compensatory strategies attempted (with notebox) Pharyngeal -- Pharyngeal- Puree Delayed swallow initiation-vallecula;Pharyngeal residue - valleculae;Pharyngeal residue - pyriform;Compensatory strategies attempted (with notebox) Pharyngeal -- Pharyngeal- Mechanical Soft -- Pharyngeal -- Pharyngeal- Regular Pharyngeal residue - pyriform;Pharyngeal residue - valleculae;Delayed swallow initiation-vallecula;Compensatory strategies attempted (with notebox) Pharyngeal -- Pharyngeal- Multi-consistency -- Pharyngeal -- Pharyngeal- Pill -- Pharyngeal -- Pharyngeal Comment --  No flowsheet data found. [name], MA CCC-SLP Acute Rehabilitation Services Pager [phone] Office [phone] Lynann Beaver 07/19/2020, 12:18 PM              Scheduled Meds: . amiodarone  200 mg Per Tube Daily  . chlorhexidine gluconate (MEDLINE KIT)  15 mL Mouth Rinse BID  . Chlorhexidine Gluconate Cloth  6 each Topical Q0600  . feeding supplement (PROSource TF)  45 mL Per Tube Daily  . furosemide  20 mg Per Tube Daily  . influenza vac split quadrivalent PF  0.5 mL Intramuscular Tomorrow-1000  . insulin aspart  0-9 Units  Subcutaneous Q4H  . insulin aspart  3 Units Subcutaneous Q4H  . lactulose  30 g Per Tube Daily  . levETIRAcetam  500 mg Per Tube BID  . mouth rinse  15 mL Mouth Rinse 10 times per day  . pantoprazole sodium  40 mg Per Tube Daily  . predniSONE  40 mg Per Tube Q breakfast  . silver sulfADIAZINE   Topical BID  . sodium chloride flush  10-40 mL Intracatheter Q12H   Continuous Infusions: . sodium chloride 10 mL (07/17/20 2300)  . feeding supplement (OSMOLITE 1.5 CAL) 1,000 mL (07/19/20 0347)  . heparin 1,450 Units/hr (07/18/20 2130)  . meropenem (MERREM) IV 2 g (07/19/20 1532)    LOS: 27 days   Kerney Elbe, DO [facility] PAGER is on [place]  If 7PM-7AM, please contact night-coverage [email or web address]

## 2020-07-19 NOTE — Progress Notes (Signed)
Modified Barium Swallow Progress Note  Patient Details  Name: Carolyn Proctor MRN: [id number] Date of Birth: [birth year]  Today's Date: 07/19/2020  Modified Barium Swallow completed.  Full report located under Chart Review in the Imaging Section.  Brief recommendations include the following:  Clinical Impression  Pt was seen for MBS, which revealed moderate oropharyngeal dysphagia as characterized by delayed oral transit, premature spillage, delayed swallow initiation, and moderate pharyngeal residue. Pt wore PMV during instrumental and tolerated it well, however, her vocal quality is dysphonic and low in volume. Delayed oral transit occurred across POs but most significantly with solids. She also demonstrated some lingual pumping with solids. Premature spillage fell to the level of at least the valleculae across POs, and to the level of the pyriforms with thin liquid. A delayed swallow initiation was observed across POs, but most significantly with solids. She demonstrated one instance of silent frank penetration with thin liquid. No other instances of aspiration were observed, even with 3 oz of thin liquid. Moderate residue in the valleculae and pyriforms was observed across POs. When prompted to produce an additional dry swallow after the PO, she successfully cleared most of the residue. SLP also attempted a chin tuck strategy with patient and was successful in reducing some residue. Recommend dys 1 diet and thin liquid with use of chin tuck and double swallow. SLP will f/u acutely for pt education of strategies and diet tolerance/advancement.    Swallow Evaluation Recommendations       SLP Diet Recommendations: Dysphagia 1 (Puree) solids;Thin liquid   Liquid Administration via: Straw   Medication Administration: Crushed with puree   Supervision: Staff to assist with self feeding;Full supervision/cueing for compensatory strategies   Compensations: Chin tuck;Multiple dry swallows after  each bite/sip;Slow rate;Small sips/bites   Postural Changes: Seated upright at 90 degrees   Oral Care Recommendations: Oral care BID        Greggory Keen 07/19/2020,11:18 AM

## 2020-07-19 NOTE — Care Management (Signed)
Spoke w Berneice Gandy from [facility]. Berneice Gandy states that there will not be a bed available until next week. [facility] will start insurance authorization tomorrow.  (Trach placed 10/4, would not be eligible for SNF placement until 11/4; therefore SNF back up not started.)

## 2020-07-19 NOTE — Progress Notes (Signed)
Inpatient Diabetes Program Recommendations  AACE/ADA: New Consensus Statement on Inpatient Glycemic Control (2015)  Target Ranges:  Prepandial:   less than 140 mg/dL      Peak postprandial:   less than 180 mg/dL (1-2 hours)      Critically ill patients:  140 - 180 mg/dL   Lab Results  Component Value Date   GLUCAP 100 (H) 07/19/2020   HGBA1C 5.5 06/23/2020    Review of Glycemic Control Results for Carolyn Proctor, Carolyn Proctor (MRN [id number]) as of 07/19/2020 11:47  Ref. Range 07/18/2020 20:31 07/18/2020 23:24 07/18/2020 23:34 07/19/2020 03:27 07/19/2020 07:36  Glucose-Capillary Latest Ref Range: 70 - 99 mg/dL 111 (H) 45 (L) 78 109 (H) 100 (H)    Inpatient Diabetes Program Recommendations:     Might consider decreasing correction coverage to Novolog 0-9 q4h to avoid hypoglycemia as patients CBG's have been running on the lower side and she was 45 mg/dL at 2324 last night.  Will continue to follow while inpatient.  Thank you, Reche Dixon, RN, BSN Diabetes Coordinator Inpatient Diabetes Program [phone] (team pager from 8a-5p)

## 2020-07-20 ENCOUNTER — Inpatient Hospital Stay (HOSPITAL_COMMUNITY): Payer: BC Managed Care – PPO

## 2020-07-20 DIAGNOSIS — J80 Acute respiratory distress syndrome: Secondary | ICD-10-CM | POA: Diagnosis not present

## 2020-07-20 DIAGNOSIS — G9340 Encephalopathy, unspecified: Secondary | ICD-10-CM | POA: Diagnosis not present

## 2020-07-20 DIAGNOSIS — U071 COVID-19: Secondary | ICD-10-CM | POA: Diagnosis not present

## 2020-07-20 LAB — GLUCOSE, CAPILLARY
Glucose-Capillary: 103 mg/dL — ABNORMAL HIGH (ref 70–99)
Glucose-Capillary: 107 mg/dL — ABNORMAL HIGH (ref 70–99)
Glucose-Capillary: 169 mg/dL — ABNORMAL HIGH (ref 70–99)
Glucose-Capillary: 85 mg/dL (ref 70–99)
Glucose-Capillary: 88 mg/dL (ref 70–99)
Glucose-Capillary: 95 mg/dL (ref 70–99)

## 2020-07-20 LAB — CBC WITH DIFFERENTIAL/PLATELET
Abs Immature Granulocytes: 2.63 10*3/uL — ABNORMAL HIGH (ref 0.00–0.07)
Basophils Absolute: 0.2 10*3/uL — ABNORMAL HIGH (ref 0.0–0.1)
Basophils Relative: 1 %
Eosinophils Absolute: 0.2 10*3/uL (ref 0.0–0.5)
Eosinophils Relative: 1 %
HCT: 33 % — ABNORMAL LOW (ref 36.0–46.0)
Hemoglobin: 10.1 g/dL — ABNORMAL LOW (ref 12.0–15.0)
Immature Granulocytes: 15 %
Lymphocytes Relative: 4 %
Lymphs Abs: 0.7 10*3/uL (ref 0.7–4.0)
MCH: 32.5 pg (ref 26.0–34.0)
MCHC: 30.6 g/dL (ref 30.0–36.0)
MCV: 106.1 fL — ABNORMAL HIGH (ref 80.0–100.0)
Monocytes Absolute: 1.2 10*3/uL — ABNORMAL HIGH (ref 0.1–1.0)
Monocytes Relative: 7 %
Neutro Abs: 12.6 10*3/uL — ABNORMAL HIGH (ref 1.7–7.7)
Neutrophils Relative %: 72 %
Platelets: 531 10*3/uL — ABNORMAL HIGH (ref 150–400)
RBC: 3.11 MIL/uL — ABNORMAL LOW (ref 3.87–5.11)
RDW: 19.9 % — ABNORMAL HIGH (ref 11.5–15.5)
WBC: 17.5 10*3/uL — ABNORMAL HIGH (ref 4.0–10.5)
nRBC: 2 % — ABNORMAL HIGH (ref 0.0–0.2)

## 2020-07-20 LAB — COMPREHENSIVE METABOLIC PANEL
ALT: 35 U/L (ref 0–44)
AST: 30 U/L (ref 15–41)
Albumin: 1.6 g/dL — ABNORMAL LOW (ref 3.5–5.0)
Alkaline Phosphatase: 187 U/L — ABNORMAL HIGH (ref 38–126)
Anion gap: 7 (ref 5–15)
BUN: 41 mg/dL — ABNORMAL HIGH (ref 6–20)
CO2: 26 mmol/L (ref 22–32)
Calcium: 8.2 mg/dL — ABNORMAL LOW (ref 8.9–10.3)
Chloride: 99 mmol/L (ref 98–111)
Creatinine, Ser: 1.16 mg/dL — ABNORMAL HIGH (ref 0.44–1.00)
GFR, Estimated: 52 mL/min — ABNORMAL LOW (ref >60)
Glucose, Bld: 91 mg/dL (ref 70–99)
Potassium: 5.2 mmol/L — ABNORMAL HIGH (ref 3.5–5.1)
Sodium: 132 mmol/L — ABNORMAL LOW (ref 135–145)
Total Bilirubin: 0.9 mg/dL (ref 0.3–1.2)
Total Protein: 5.5 g/dL — ABNORMAL LOW (ref 6.5–8.1)

## 2020-07-20 LAB — HEPARIN LEVEL (UNFRACTIONATED): Heparin Unfractionated: 0.82 IU/mL — ABNORMAL HIGH (ref 0.30–0.70)

## 2020-07-20 LAB — MAGNESIUM: Magnesium: 2 mg/dL (ref 1.7–2.4)

## 2020-07-20 LAB — PHOSPHORUS: Phosphorus: 3.1 mg/dL (ref 2.5–4.6)

## 2020-07-20 MED ORDER — PREDNISONE 5 MG PO TABS
30.0000 mg | ORAL_TABLET | Freq: Every day | ORAL | Status: DC
  Administered 2020-07-21 – 2020-07-26 (×6): 30 mg via NASOGASTRIC
  Filled 2020-07-20 (×6): qty 2

## 2020-07-20 MED ORDER — APIXABAN 5 MG PO TABS
5.0000 mg | ORAL_TABLET | Freq: Two times a day (BID) | ORAL | Status: DC
  Administered 2020-07-20 – 2020-07-26 (×12): 5 mg
  Filled 2020-07-20 (×13): qty 1

## 2020-07-20 NOTE — Progress Notes (Addendum)
PROGRESS NOTE    Carolyn Proctor  XAJ:[id number] DOB: 13-Feb-1961 DOA: 06/22/2020 PCP: Lilian Coma., MD   Brief Narrative:  Patient is a 59 year old chronically ill-appearing African-American female with a past medical history significant for proximal atrial fibrillation, history of DVT, history of PE anticoagulated Eliquis, history of heart failure with preserved ejection fraction, history of CKD stage III, history of rheumatoid arthritis, hypercoagulable state including increased protein S antigen, decreased protein C activity and decreased Antithrombin III, history of centrilobular emphysema and pulmonary fibrosis, history of chronic venous insufficiency with chronic foot ulcers, history of diverticulitis, history of mixed connective tissue disease and other comorbidities who felt like she was having a stroke and then developed a left facial droop and went unresponsive.  She subsequently required bag valve mask by EMS and was noted to be hypoglycemic and hypotensive.  She was intubated in the ED for airway protection and head CT was done which was negative.  And because she had refractory hypoglycemia despite multiple D 50 a.m. she was requiring a drip.  PCCM was called to admit for this patient.  She is admitted on 06/22/2020 and initiated on pulse dose steroids and diuresis given that she was in cardiogenic shock.  On 07/02/2020 she was on full ventilatory support and was encephalopathic precluding extubation.  926 she is but not weaning and encephalopathic with dysautonomia and off amiodarone drip and subsequently tracheostomy was placed on 07/11/2020.  Further work-up revealed that she had acute hypoxic respiratory failure with inability to protect her airway in the setting of hypoglycemia and subsequently developing HCAP with an ESBL E. coli and she has interstitial lung disease with connective tissue disease with acute pneumonitis and pulmonary edema.  She was also found to be subsequently an acute  systolic CHF and advanced heart failure team was consulted.  During her hospitalization she also developed atrial flutter with atrial tachycardia runs of nonsustained ventricular tachycardia.  Subsequently she has been placed on many antibiotics and escalated to meropenem given her ESBL.  She has now been on trach collar and she is transferred to [facility] service on 07/14/2020 after a prolonged ICU stay.  Today she spiked a temperature of 102 and became tachycardic and tachypneic.  We will panculture her again and resume antibiotics with IV meropenem. Pulmonary still will be following for trach care.  07/15/2020: She developed emesis this morning as well as chest pain and was given antimedics.  Pulmonary was reconsulted given her worsening respiratory status but she is stabilized.  Family is interested in [place] at [place].  07/16/2020: Temperature curve is improving.  PT OT recommending LTACH.  Also get speech therapy to see the patient cannot use PMV with speech therapy.  SLP not evaluated given fatigue.  [facility] [place] status appears stable compared to yesterday.  07/17/2020: Her temperature has resolved and her WBC was daily.  Respiratory status is relatively stable and she is not as rhonchorous.  Patient thinks that she is doing better today and will continue SLP efforts.  Renal function remains stable but her sodium did drop slightly likely in setting of diuretics.  We will also need to continue to monitor hemoglobin hematocrit given that she is anticoagulated with heparin drip.  07/18/2020: Cardiology evaluated and stopping her IV Lasix today and starting p.o. Lasix 20 mg p.o. daily tomorrow.  Cardiology feels that her right heart cath would not change management at this point and I would not follow peripherally and consider right heart cath at that time if she  improves.  Shortness of breath has improved.  PT still recommending LTAC.  SLP working with the patient with the [facility] and they continue to recommend n.p.o. at  this time and then MBS be conducted to further assess physiology.  Continue her core track tube feedings.  SELECT screen the patient and want to submit short authorization.  Pulmonary evaluated and recommending continuing the #6 cuffed trach in place for swallow evaluation and mobilizing.  We will continue antibiotics for 14 days.  07/19/2020: Patient is doing much better today and underwent an MBS which recommended a dysphagia 1 pure diet with thin liquids.  If she is able to tolerate this with her competition strategies we can discontinue her core track tube feedings and change her IV heparin to p.o. Eliquis.  She was hypoglycemic overnight so we have adjusted her insulin scale.  LTAC recommending that there is not to me a bed available until next week but insurance authorization will start tomorrow and she is currently not eligible for SNF placement 08/11/2020.  07/20/2020: Seen and examined.  No complaints.  Assessment & Plan:   Active Problems:   Pulmonary HTN (HCC)   Acute encephalopathy   Acute respiratory failure (HCC)   Oxygen desaturation   Cardiogenic shock (HCC)   Acute renal failure with tubular necrosis (HCC)   RVF (right ventricular failure) (HCC)   E. 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 - patient doing well on minimal ventilation settings, trach collar trial - completed meropenem abx treatment but because she spiked another temperature, Merrem was resumed on 07/14/2020 and will likely need prolonged treatment and continue for 14 days total; looks like she has been on prednisone 40 mg daily for acute pneumonitis in setting of ILD since 07/05/2020.  We will start tapering.  Start on 30 mg for a week. -Dr.  [place] Bells discussed with her outpatient rheumatologist and they feel that this is a vasculitis picture and given her underlying infections and they cannot use more aggressive immunosuppression other than prednisone at this time -LTAC looking at the patient and per reports, LTAC will have a bed early next week  Fever and SIRS, improving  -Temperature of 102 on 07/14/20 and was tachycardic and tachypneic -Currently no clear source of infection and she had just completed treatment for HCAP with ESBL E Coli but this is likely the source so PCCM recommended 14 more days of Merrem. -Blood cultures x2  NGTD however has slightly worsening leukocytosis but remains afebrile.  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 Ephriam Jenkins with [facility]). - previously treated with rituximab >> last in November 2020 - defer further immunosuppressive therapy while she is being treated for infection -She will need close rheumatology follow-up at discharge.   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 Atrial flutter/atrial tachycardia with runs of NSVT - new this admission. - advanced heart failure team following intermittently; initially she was in cardiogenic shock due to right ventricular failure/cor pulmonale and echo was repeated and she is improved with milrinone and diuresis -Sequently developed septic shock with ESBL E. coli and was changed from cefepime to meropenem - continue amiodarone 200 mg  daily -Cardiology recommending that she needs a right heart catheter but currently not a candidate and they are not to pursue this on Friday and Dr. Haroldine Laws feels that she needs to be more stable before even attempts of right heart cath -Patient is +2.551 L since admission -Cardiology recommends that a right heart cath would not change management at this point and they feel that she needs to  be more stable and they can pursue this later on call cardiology feels there are no further interventions from their standpoint and there I want to follow peripherally.  She is on Lasix 20 mg daily.  Now that she is on dysphagia 1 diet, we will switch her from heparin to Eliquis, will consult pharmacy for that.  Acute metabolic encephalopathy from hypoglycemia, hypotension, hypoxia. Seizure in setting of hypoxia with respiratory failure. -continue Keppra -Delirium precautions -Brain MRI with no evidence of vasculitis   Pulmonary Embolism in June 2021. We will switch to Eliquis today.  Bilateral Pressure Ulcers - Wound Consult - BID Dressing Change w/ silvadene  Hypernatremia (Resolved) she is now on hyponatremic Hyponatremia stable.  Watch for now.  Hyperkalemia Mild.  5.2.  Repeat labs in the morning.  Liver cirrhosis Abnormal LFTs, LFTs normal now. -continue lactulose, titrate~200 mL of stool per rectal tube  Steroid Induced Hyperglycemia. Controlled.  Continue current regimen.  Nausea and Emesis, improved  Macrocytic Anemia Stable.  Monitor.  Chest Pain, rule out ACS;chest pain has completely resolved -Chest pain a few days ago which is now completely resolved -EKG was relatively unremarkable and nonischemic; -Troponin was initially elevated likely demand ischemia in the setting of her nausea vomiting and her current comorbidities  Thrombocytosis Reactive and stable.  She is already on anticoagulant.  Severe Protein Calorie Malnutrition. On dysphagia 1 diet but not eating much so supplemented by tube feedings.  DVT prophylaxis: Anticoagulated with a heparin drip, switching to Eliquis. Code Status: FULL CODE  Family Communication: Discussed with sister at the bedside. Disposition Plan: Pending further improvement of her clinical status and resolution of her fevers.  Will need clearance by pulmonary as well as cardiology and may require an inpatient right  heart cath if she improves; cardiology now signed off and pulmonary following.  She needs LTAC placement and bed availability will not be until next week  Status is: Inpatient  Remains inpatient appropriate because:Ongoing diagnostic testing needed not appropriate for outpatient work up, Unsafe d/c plan, IV treatments appropriate due to intensity of illness or inability to take PO and Inpatient level of care appropriate due to severity of illness   Dispo: The patient is from: Home              Anticipated d/c is to: LTAC              Anticipated d/c date is: > 3 days              Patient currently is not medically stable to d/c.  Consultants:   PCCM Transfer  Cardiology  Neurology  Procedures:  9/15 ETT >> 9/15 R IJ TL CVC >>9/27 9/27 PICC >  10/4 Trach  Significant Diagnostic Tests:   9/15 CTH >> negative  9/15 CTA chest/ abd/ pelvis >> significant dilatation of RA and RV consistent with R heart failure; moderate R and small L pleural effusions with associated atelectasis of both lower lobes; underlying chronic cystic and fibrotic lung disease; severe anasarca; nodular contour of liver suggestive of possible cirrhosis  9/15 Echo>  LVEF 35-40%, mod decreased LV function, RVSP 75, severely reduced RV systolic function; R to left shunt noted, suggestive of PFO, RA severely dilated, mod pericardial effusion  9/15 CTA head and neck >> no significant carotid or vertebral artery stenosis in neck, no LVO, right jugular central venous catheter with probable surrounding hematoma  9/18 MR BRAIN>> no acute intracranial abnormality  9/28 MRI brain > diffuse pachymeningeal enhancement, bilateral cerebral convexities, sub centimeter extra axial focus of T1 hyperintensity along R temporal occiptal lobe, new  Micro Data:  9/15 BCx2 central line >> NG5D 9/15 BCx2 PIV >> Strept mitis 9/15 SARS2 >> negative 9/15 UC >> ESBL E coli 9/16 Trach asp cult >> Normal flora 9/27 blood >  negative 9/27 resp > e coli ESBL  Antimicrobials:  9/15 vanc >> 9/17 9/15 zosyn >> 9/20  9/27 vanc > 9/28 9/27 cefepime > 9/29 9/29 meropenem > 10/5 and resumed on 10/6 >  Antimicrobials:  Anti-infectives (From admission, onward)   Start     Dose/Rate Route Frequency Ordered Stop   07/14/20 1400  meropenem (MERREM) 2 g in sodium chloride 0.9 % 100 mL IVPB        2 g 200 mL/hr over 30 Minutes Intravenous Every 8 hours 07/14/20 1339     07/06/20 1400  meropenem (MERREM) 2 g in sodium chloride 0.9 % 100 mL IVPB        2 g 200 mL/hr over 30 Minutes Intravenous Every 8 hours 07/06/20 1124 07/12/20 2230   07/05/20 1000  vancomycin (VANCOREADY) IVPB 500 mg/100 mL  Status:  Discontinued        500 mg 100 mL/hr over 60 Minutes Intravenous Every 12 hours 07/04/20 2127 07/05/20 0902   07/04/20 2200  ceFEPIme (MAXIPIME) 2 g in sodium chloride 0.9 % 100 mL IVPB  Status:  Discontinued        2 g 200 mL/hr over 30 Minutes Intravenous Every 12 hours 07/04/20 0929 07/06/20 1124   07/04/20 2200  vancomycin (VANCOREADY) IVPB 500 mg/100 mL  Status:  Discontinued        500 mg 100 mL/hr over 60 Minutes Intravenous Every 12 hours 07/04/20 1149 07/04/20 2127   07/04/20 2200  vancomycin (VANCOCIN) IVPB 1000 mg/200 mL premix        1,000 mg 200 mL/hr over 60 Minutes Intravenous  Once 07/04/20 2127 07/05/20 0032   07/04/20 0930  vancomycin (VANCOCIN) IVPB 1000 mg/200 mL premix  Status:  Discontinued        1,000 mg 200 mL/hr over 60 Minutes Intravenous  Once 07/04/20 0929 07/04/20 2127   07/04/20 0930  vancomycin (VANCOREADY) IVPB 500 mg/100 mL  Status:  Discontinued        500 mg 100 mL/hr over 60 Minutes Intravenous Every 12 hours 07/04/20 0929 07/04/20 1149   06/23/20 1015  vancomycin (VANCOREADY) IVPB 750 mg/150 mL  Status:  Discontinued        750 mg 150 mL/hr over 60 Minutes Intravenous Every 12 hours 06/23/20 1007 06/24/20 0849   06/23/20 1000  piperacillin-tazobactam (ZOSYN) IVPB 3.375 g         3.375 g 12.5 mL/hr over 240 Minutes Intravenous Every 8 hours 06/23/20 0948 06/28/20 0805   06/22/20 1230  vancomycin (VANCOREADY) IVPB 1250 mg/250 mL        1,250 mg 166.7 mL/hr over 90 Minutes Intravenous  Once 06/22/20 1226 06/22/20 1535   06/22/20 1230  piperacillin-tazobactam (ZOSYN) IVPB 3.375 g  3.375 g 100 mL/hr over 30 Minutes Intravenous  Once 06/22/20 1226 06/22/20 1502       Subjective: Seen and examined.  She is alert but does not talk due to having trach.  She communicates by nodding her head.  Denied having any complaint.  Her sister walked in at the end of my encounter.  Objective: Vitals:   07/20/20 0910 07/20/20 1119 07/20/20 1145 07/20/20 1509  BP: 109/68 121/73 112/70 109/66  Pulse: 87 90 89 89  Resp: (!) 22 (!) 21 (!) 21 (!) 24  Temp:   98.2 F (36.8 C)   TempSrc:   Axillary   SpO2: 99% 98%  95%  Weight:      Height:        Intake/Output Summary (Last 24 hours) at 07/20/2020 1534 Last data filed at 07/20/2020 1100 Gross per 24 hour  Intake --  Output 5500 ml  Net -5500 ml   Filed Weights   07/18/20 0500 07/19/20 0329 07/20/20 0352  Weight: 62.3 kg 62.5 kg 57.2 kg   Examination: Physical Exam:   General exam: Appears calm and comfortable with trach Respiratory system: Clear to auscultation. Respiratory effort normal. Cardiovascular system: S1 & S2 heard, RRR. No JVD, murmurs, rubs, gallops or clicks. No pedal edema. Gastrointestinal system: Abdomen is nondistended, soft and nontender. No organomegaly or masses felt. Normal bowel sounds heard. Central nervous system: Alert and oriented. No focal neurological deficits but seems to have generalized weakness.      Data Reviewed: I have personally reviewed following labs and imaging studies  CBC: Recent Labs  Lab 07/15/20 0517 07/15/20 0517 07/16/20 0506 07/17/20 0500 07/18/20 0536 07/19/20 0358 07/20/20 0408  WBC 16.8*   < > 15.4* 14.8* 16.9* 15.7* 17.5*  NEUTROABS 14.7*  --   13.3* 11.9* 13.4*  --  12.6*  HGB 11.0*   < > 10.8* 9.8* 10.8* 10.1* 10.1*  HCT 35.3*   < > 35.1* 32.3* 34.3* 33.6* 33.0*  MCV 106.6*   < > 106.0* 106.3* 106.5* 105.3* 106.1*  PLT 314   < > 373 425* 485* 512* 531*   < > = values in this interval not displayed.   Basic Metabolic Panel: Recent Labs  Lab 07/16/20 0506 07/17/20 0500 07/18/20 0536 07/19/20 1117 07/20/20 0408  NA 136 128* 133* 133* 132*  K 5.4* 5.1 5.4* 5.0 5.2*  CL 105 96* 100 100 99  CO2 '24 23 26 25 26  ' GLUCOSE 162* 257* 115* 99 91  BUN 39* 39* 41* 41* 41*  CREATININE 1.08* 1.10* 1.00 1.04* 1.16*  CALCIUM 8.2* 7.7* 8.0* 7.7* 8.2*  MG 1.9 2.0 2.0 1.9 2.0  PHOS 3.4 3.1 3.7 3.1 3.1   GFR: Estimated Creatinine Clearance: 48.3 mL/min (A) (by C-G formula based on SCr of 1.16 mg/dL (H)). Liver Function Tests: Recent Labs  Lab 07/16/20 0506 07/17/20 0500 07/18/20 0536 07/19/20 1117 07/20/20 0408  AST 33 36 30 33 30  ALT 53* 53* 39 37 35  ALKPHOS 199* 205* 211* 213* 187*  BILITOT 0.9 0.8 0.7 0.7 0.9  PROT 5.7* 5.0* 5.4* 5.4* 5.5*  ALBUMIN 1.5* 1.4* 1.4* 1.6* 1.6*   No results for input(s): LIPASE, AMYLASE in the last 168 hours. No results for input(s): AMMONIA in the last 168 hours. Coagulation Profile: No results for input(s): INR, PROTIME in the last 168 hours. Cardiac Enzymes: No results for input(s): CKTOTAL, CKMB, CKMBINDEX, TROPONINI in the last 168 hours. BNP (last 3 results) No results for input(s): PROBNP  in the last 8760 hours. HbA1C: No results for input(s): HGBA1C in the last 72 hours. CBG: Recent Labs  Lab 07/19/20 2355 07/20/20 0000 07/20/20 0334 07/20/20 0734 07/20/20 1147  GLUCAP 45* 95 85 107* 103*   Lipid Profile: No results for input(s): CHOL, HDL, LDLCALC, TRIG, CHOLHDL, LDLDIRECT in the last 72 hours. Thyroid Function Tests: No results for input(s): TSH, T4TOTAL, FREET4, T3FREE, THYROIDAB in the last 72 hours. Anemia Panel: Recent Labs    07/18/20 0534 07/18/20 0536   VITAMINB12  --  588  FOLATE 8.1  --   FERRITIN  --  650*  TIBC  --  224*  IRON  --  52  RETICCTPCT  --  5.3*   Sepsis Labs: No results for input(s): PROCALCITON, LATICACIDVEN in the last 168 hours.  Recent Results (from the past 240 hour(s))  Culture, Urine     Status: None   Collection Time: 07/14/20  1:15 PM   Specimen: Urine, Random  Result Value Ref [place] Status   Specimen Description URINE, RANDOM  Final   Special Requests NONE  Final   Culture   Final    NO GROWTH Performed at [facility], [address] [place], [place] [address]    Report Status 07/15/2020 FINAL  Final  Culture, blood (routine x 2)     Status: None   Collection Time: 07/14/20  2:06 PM   Specimen: BLOOD LEFT ARM  Result Value Ref [place] Status   Specimen Description BLOOD LEFT ARM  Final   Special Requests   Final    BOTTLES DRAWN AEROBIC ONLY Blood Culture adequate volume   Culture   Final    NO GROWTH 5 DAYS Performed at [facility], [place] [address] [place], [place] [address]    Report Status 07/19/2020 FINAL  Final  Culture, blood (routine x 2)     Status: None   Collection Time: 07/14/20  2:10 PM   Specimen: BLOOD LEFT HAND  Result Value Ref [place] Status   Specimen Description BLOOD LEFT HAND  Final   Special Requests   Final    BOTTLES DRAWN AEROBIC ONLY Blood Culture adequate volume   Culture   Final    NO GROWTH 5 DAYS Performed at [facility], [place] [address], [place], [place] [address]    Report Status 07/19/2020 FINAL  Final     RN Pressure Injury Documentation:     Estimated body mass index is 17.59 kg/m as calculated from the following:   Height as of this encounter: '5\' 11"'  (1.803 m).   Weight as of this encounter: 57.2 kg.  Malnutrition Type:  Nutrition Problem: Severe Malnutrition Etiology: chronic illness   Malnutrition Characteristics:  Signs/Symptoms: severe muscle depletion, energy intake < or equal to 75% for > or equal to 1 month,  moderate fat depletion, edema   Nutrition Interventions:  Interventions: Tube feeding, Prostat   Radiology Studies: DG CHEST PORT 1 VIEW  Result Date: 07/20/2020 CLINICAL DATA:  Tracheostomy. EXAM: PORTABLE CHEST 1 VIEW COMPARISON:  July 18, 2020. FINDINGS: Stable cardiomegaly. Tracheostomy and feeding tubes are unchanged in position. Right-sided PICC line is unchanged. No pneumothorax is noted. Minimal bibasilar subsegmental atelectasis is noted. No significant pleural effusion is noted. Bony thorax is unremarkable. IMPRESSION: Stable support apparatus. Minimal bibasilar subsegmental atelectasis. Electronically Signed   By: Marijo Conception M.D.   On: 07/20/2020 08:36   DG Swallowing Func-Speech Pathology  Result Date: 07/19/2020 Objective Swallowing Evaluation:  Type of Study: MBS-Modified Barium Swallow Study  Patient Details Name: ANETRIA HARWICK MRN: [id number] Date of Birth: 1961/02/11 Today's Date: 07/19/2020 Time: SLP Start Time (ACUTE ONLY): 1020 -SLP Stop Time (ACUTE ONLY): 1045 SLP Time Calculation (min) (ACUTE ONLY): 25 min Past Medical History: Past Medical History: Diagnosis Date . Acute myopericarditis 02/24/2013 . Atrial fibrillation ([place])  . Atrial fibrillation with RVR ([place]) 02/22/2013 . Chronic anticoagulation, Xarelto 04/02/2013 . Chronic diastolic CHF (congestive heart failure) ([place]) 06/26/2019 . Diverticulitis  . Elevated troponin- secondary to Myopericarditis 02/25/2013 . Emphysema  07/07/2019 . Hypertension  . Lupus ([place]) 09/14/2011 . Multifocal atrial tachycardia ([place]) 02/22/2013 . PAF (paroxysmal atrial fibrillation) ([place]) 04/02/2013 . Pleuritic chest pain 02/22/2013 . 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 Rheumatoid arthritis ([place]) 07/07/2019 . Rheumatoid lung disease ([place]) 06/23/2019 . Right ventricular dysfunction 08/05/2019 . Thrombocytosis ([place])  09/14/2011 Past Surgical History: Past Surgical History: Procedure Laterality Date . BONE MARROW BIOPSY  2010 . CARDIAC CATHETERIZATION   . LEFT HEART CATHETERIZATION WITH CORONARY ANGIOGRAM N/A 02/24/2013  Procedure: LEFT HEART CATHETERIZATION WITH CORONARY ANGIOGRAM;  Surgeon: Leonie Man, MD;  Location: [facility] CATH LAB;  Service: Cardiovascular;  Laterality: N/A; . RIGHT/LEFT HEART CATH AND CORONARY ANGIOGRAPHY N/A 09/09/2019  Procedure: RIGHT/LEFT HEART CATH AND CORONARY ANGIOGRAPHY;  Surgeon: Jettie Booze, MD;  Location: [facility] CV LAB;  Service: Cardiovascular;  Laterality: N/A; HPI:  Patient is a 59 year old female. She presented to the hospital with reports of stroke like, left facial droop and unresponsiveness. She has had a complicated hospital stay which included intubation from 9/15-10/4 with trach placed on 10/4.  PMH: lupus, a-fib, CHF, tachycardia, thrombocytosis, right ventricular dysfunction, rheumatoid lung disease, RA. CT hyead and MRI 9/15 and 9/18 were negative for acute changes. MRI brain 9/28: bilateral cerebral convexities measuring up to 3 mm in greatest thickness. Appears to predominantly correspond with diffuse pachymeningeal dural thickening and enhancement. However, small bilateral subdural effusions difficult to exclude.  Subjective: Pt was lethargic with daughter at bedside Assessment / Plan / Recommendation CHL IP CLINICAL IMPRESSIONS 07/19/2020 Clinical Impression Pt was seen for MBS, which revealed moderate oropharyngeal dysphagia as characterized by delayed oral transit, premature spillage, delayed swallow initiation, and moderate pharyngeal residue. Pt wore PMV during instrumental and tolerated it well, however, her vocal quality is dysphonic and low in volume. Delayed oral transit occurred across POs but most significantly with solids. She also demonstrated some lingual pumping with solids. Premature spillage fell to the level of at least the valleculae across POs, and  to the level of the pyriforms with thin liquid. A delayed swallow initiation was observed across POs, but most significantly with solids. She demonstrated one instance of trace aspiration with thin liquid. No other instances of aspiration were observed, even with 3 oz of thin liquid. Moderate residue in the valleculae and pyriforms was observed across POs. When prompted to produce an additional dry swallow after the PO, she successfully cleared most of the residue. SLP also attempted a chin tuck strategy with patient and was successful in reducing some residue. Recommend dys 1 diet and thin liquid with use of chin tuck and double swallow. SLP will f/u acutely for pt education of strategies and diet tolerance/advancement.  SLP Visit Diagnosis Dysphagia, oropharyngeal phase (R13.12) Attention and concentration deficit following -- Frontal lobe and executive function deficit following --  Impact on safety and function Moderate aspiration risk   [facility] IP TREATMENT RECOMMENDATION 07/19/2020 Treatment Recommendations Therapy as outlined in treatment plan below   Prognosis 07/19/2020 Prognosis for Safe Diet Advancement Fair Barriers to Reach Goals Severity of deficits Barriers/Prognosis Comment -- [facility] IP DIET RECOMMENDATION 07/19/2020 SLP Diet Recommendations Dysphagia 1 (Puree) solids;Thin liquid Liquid Administration via Straw Medication Administration Crushed with puree Compensations Chin tuck;Multiple dry swallows after each bite/sip;Slow rate;Small sips/bites Postural Changes Seated upright at 90 degrees   CHL IP OTHER RECOMMENDATIONS 07/19/2020 Recommended Consults -- Oral Care Recommendations Oral care BID Other Recommendations --   CHL IP FOLLOW UP RECOMMENDATIONS 07/19/2020 Follow up Recommendations LTACH   CHL IP FREQUENCY AND DURATION 07/19/2020 Speech Therapy Frequency (ACUTE ONLY) min 2x/week Treatment Duration 2 weeks      CHL IP ORAL PHASE 07/19/2020 Oral Phase Impaired Oral - Pudding Teaspoon -- Oral - Pudding  Cup -- Oral - Honey Teaspoon -- Oral - Honey Cup -- Oral - Nectar Teaspoon -- Oral - Nectar Cup -- Oral - Nectar Straw Premature spillage;Delayed oral transit Oral - Thin Teaspoon -- Oral - Thin Cup -- Oral - Thin Straw Premature spillage;Delayed oral transit Oral - Puree Premature spillage;Delayed oral transit Oral - Mech Soft -- Oral - Regular Premature spillage;Delayed oral transit;Lingual pumping Oral - Multi-Consistency -- Oral - Pill -- Oral Phase - Comment --  [facility]L IP PHARYNGEAL PHASE 07/19/2020 Pharyngeal Phase Impaired Pharyngeal- Pudding Teaspoon -- Pharyngeal -- Pharyngeal- Pudding Cup -- Pharyngeal -- Pharyngeal- Honey Teaspoon -- Pharyngeal -- Pharyngeal- Honey Cup -- Pharyngeal -- Pharyngeal- Nectar Teaspoon -- Pharyngeal -- Pharyngeal- Nectar Cup -- Pharyngeal -- Pharyngeal- Nectar Straw Delayed swallow initiation-vallecula;Pharyngeal residue - valleculae;Pharyngeal residue - pyriform Pharyngeal -- Pharyngeal- Thin Teaspoon -- Pharyngeal -- Pharyngeal- Thin Cup -- Pharyngeal -- Pharyngeal- Thin Straw Delayed swallow initiation-vallecula;Delayed swallow initiation-pyriform sinuses;Pharyngeal residue - valleculae;Pharyngeal residue - pyriform;Trace aspiration;Compensatory strategies attempted (with notebox) Pharyngeal -- Pharyngeal- Puree Delayed swallow initiation-vallecula;Pharyngeal residue - valleculae;Pharyngeal residue - pyriform;Compensatory strategies attempted (with notebox) Pharyngeal -- Pharyngeal- Mechanical Soft -- Pharyngeal -- Pharyngeal- Regular Pharyngeal residue - pyriform;Pharyngeal residue - valleculae;Delayed swallow initiation-vallecula;Compensatory strategies attempted (with notebox) Pharyngeal -- Pharyngeal- Multi-consistency -- Pharyngeal -- Pharyngeal- Pill -- Pharyngeal -- Pharyngeal Comment --  No flowsheet data found. [name], MA CCC-SLP Acute Rehabilitation Services Pager [phone] Office [phone] Lynann Beaver 07/19/2020, 12:18 PM               Scheduled Meds: . amiodarone  200 mg Per Tube Daily  . chlorhexidine gluconate (MEDLINE KIT)  15 mL Mouth Rinse BID  . Chlorhexidine Gluconate Cloth  6 each Topical Q0600  . feeding supplement (PROSource TF)  45 mL Per Tube Daily  . furosemide  20 mg Per Tube Daily  . influenza vac split quadrivalent PF  0.5 mL Intramuscular Tomorrow-1000  . insulin aspart  0-9 Units Subcutaneous Q4H  . insulin aspart  3 Units Subcutaneous Q4H  . lactulose  30 g Per Tube Daily  . levETIRAcetam  500 mg Per Tube BID  . mouth rinse  15 mL Mouth Rinse 10 times per day  . pantoprazole sodium  40 mg Per Tube Daily  . predniSONE  40 mg Per Tube Q breakfast  . silver sulfADIAZINE   Topical BID  . sodium chloride flush  10-40 mL Intracatheter Q12H   Continuous Infusions: . sodium chloride 10 mL (07/17/20 2300)  . feeding supplement (OSMOLITE 1.5 CAL) 1,000 mL (07/19/20 2153)  . heparin 1,350 Units/hr (07/20/20 0857)  .  meropenem (MERREM) IV 2 g (07/20/20 1325)    LOS: 28 days   Darliss Cheney, MD [facility] PAGER is on [place]  If 7PM-7AM, please contact night-coverage [email or web address]

## 2020-07-20 NOTE — Progress Notes (Signed)
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) + PAF   No Known Allergies  Patient Measurements: Height: 5\' 11"  (180.3 cm) Weight: 57.2 kg (126 lb 1.7 oz) IBW/kg (Calculated) : 70.8 Heparin Dosing Weight: 63.8 kg  Vital Signs: Temp: 99.1 F (37.3 C) (10/13 1552) Temp Source: Oral (10/13 1552) BP: 115/69 (10/13 1552) Pulse Rate: 90 (10/13 1552)  Labs: Recent Labs    07/18/20 0535 07/18/20 0536 07/18/20 0536 07/19/20 0358 07/19/20 0359 07/19/20 1117 07/20/20 0408  HGB  --  10.8*   < > 10.1*  --   --  10.1*  HCT  --  34.3*  --  33.6*  --   --  33.0*  PLT  --  485*  --  512*  --   --  531*  HEPARINUNFRC 0.56  --   --   --  0.63  --  0.82*  CREATININE  --  1.00  --   --   --  1.04* 1.16*   < > = values in this interval not displayed.    Estimated Creatinine Clearance: 48.3 mL/min (A) (by C-G formula based on SCr of 1.16 mg/dL (H)).   Assessment: Anticoag: Apixaban PTA for hx Protein S defic, hypercoag w/ recent LE DVT and PE (03/2020), PAF. CTA neg for PE. Apixaban held 10/1 over w/end for planned trach 10/4 - patient on heparin bridge with plans to transition back to apixaban when able.   Heparin level earlier today on heparin infusion at 1450 units/hr was 0.82 units/ml, which is above the goal range for this pt; heparin infusion was decreased to 1350 units/hr. H/H, platelets stable. Per RN, no issues with IV or bleeding observed.  Pharmacy is now consulted to transition patient from IV heparin to apixaban.   Goal of Therapy:  Heparin level 0.3-0.7 units/ml Monitor platelets by anticoagulation protocol: Yes   Plan:  Discontinue heparin infusion at 2000 this evening and start apixaban 5 mg per tube BID Monitor daily CBC Monitor for signs/symptoms of bleeding  Gillermina Hu, PharmD, BCPS, [facility] Clinical Pharmacist 07/20/2020 3:55 PM

## 2020-07-20 NOTE — Progress Notes (Signed)
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) + PAF   No Known Allergies  Patient Measurements: Height: 5\' 11"  (180.3 cm) Weight: 57.2 kg (126 lb 1.7 oz) IBW/kg (Calculated) : 70.8 Heparin Dosing Weight: 63.8 kg  Vital Signs: Temp: 98.8 F (37.1 C) (10/13 0735) Temp Source: Axillary (10/13 0735) BP: 111/79 (10/13 0735) Pulse Rate: 89 (10/13 0735)  Labs: Recent Labs    07/18/20 0535 07/18/20 0536 07/18/20 0536 07/19/20 0358 07/19/20 0359 07/19/20 1117 07/20/20 0408  HGB  --  10.8*   < > 10.1*  --   --  10.1*  HCT  --  34.3*  --  33.6*  --   --  33.0*  PLT  --  485*  --  512*  --   --  531*  HEPARINUNFRC 0.56  --   --   --  0.63  --  0.82*  CREATININE  --  1.00  --   --   --  1.04* 1.16*   < > = values in this interval not displayed.    Estimated Creatinine Clearance: 48.3 mL/min (A) (by C-G formula based on SCr of 1.16 mg/dL (H)).   Assessment: Anticoag: Eliquis PTA for hx Protein S defic, hypercoag w/ recent LE DVT and PE (03/2020) + PAF. CTA neg for PE. Apix held 10/1 over w/end for planned trach 10/4 - to start Hep bridge s/p trach, if stable - can go back to Apix - HL 0.8 above goal, Hgb 10.1 (stable).  -No bleeding noted.   ID: s/p Mero for ESBL PNA. Afebrile, WBC up to 17.5, scr 1.1  Vanc 9/15 x 1; restart 9/16 >> 9/17; 9/27> Zosyn 9/15 x 1; restart 9/16 >> 9/20 Cefepime 9/27>9/29 Meropenem 9/29>10/5, 10/7>>  9/15 BCx >> 2/4 Strep mitis/oralis (likely contaminant) 9/15 UCx >> 100k col ESBL Ecoli (s-zosyn) 9/16 RCx (trach aspirate) - negative 9/16 MRSA- negative 9/26: Bcx : neg 9/26: Rcx: ESBL E.coli   Goal of Therapy:  Heparin level 0.3-0.7 units/ml Monitor platelets by anticoagulation protocol: Yes   Plan:  - Reduce IV heparin to 1350 units/hr - Daily HL and BC - Plan to convert to apixaban when reliably taking  po's  -Borderline renal function but given drug resistance will continue with high dose meropenem q8 hours  Erin Hearing PharmD., BCPS Clinical Pharmacist 07/20/2020 8:31 AM

## 2020-07-21 DIAGNOSIS — Z930 Tracheostomy status: Secondary | ICD-10-CM | POA: Diagnosis not present

## 2020-07-21 DIAGNOSIS — R570 Cardiogenic shock: Secondary | ICD-10-CM | POA: Diagnosis not present

## 2020-07-21 DIAGNOSIS — R6521 Severe sepsis with septic shock: Secondary | ICD-10-CM

## 2020-07-21 DIAGNOSIS — A419 Sepsis, unspecified organism: Secondary | ICD-10-CM

## 2020-07-21 DIAGNOSIS — G931 Anoxic brain damage, not elsewhere classified: Secondary | ICD-10-CM | POA: Diagnosis not present

## 2020-07-21 DIAGNOSIS — E43 Unspecified severe protein-calorie malnutrition: Secondary | ICD-10-CM | POA: Diagnosis not present

## 2020-07-21 DIAGNOSIS — Z9889 Other specified postprocedural states: Secondary | ICD-10-CM

## 2020-07-21 LAB — GLUCOSE, CAPILLARY
Glucose-Capillary: 102 mg/dL — ABNORMAL HIGH (ref 70–99)
Glucose-Capillary: 104 mg/dL — ABNORMAL HIGH (ref 70–99)
Glucose-Capillary: 108 mg/dL — ABNORMAL HIGH (ref 70–99)
Glucose-Capillary: 111 mg/dL — ABNORMAL HIGH (ref 70–99)
Glucose-Capillary: 156 mg/dL — ABNORMAL HIGH (ref 70–99)
Glucose-Capillary: 192 mg/dL — ABNORMAL HIGH (ref 70–99)
Glucose-Capillary: 84 mg/dL (ref 70–99)

## 2020-07-21 LAB — CBC
HCT: 34.9 % — ABNORMAL LOW (ref 36.0–46.0)
Hemoglobin: 10.9 g/dL — ABNORMAL LOW (ref 12.0–15.0)
MCH: 33.4 pg (ref 26.0–34.0)
MCHC: 31.2 g/dL (ref 30.0–36.0)
MCV: 107.1 fL — ABNORMAL HIGH (ref 80.0–100.0)
Platelets: 591 10*3/uL — ABNORMAL HIGH (ref 150–400)
RBC: 3.26 MIL/uL — ABNORMAL LOW (ref 3.87–5.11)
RDW: 20.1 % — ABNORMAL HIGH (ref 11.5–15.5)
WBC: 17.2 10*3/uL — ABNORMAL HIGH (ref 4.0–10.5)
nRBC: 1.7 % — ABNORMAL HIGH (ref 0.0–0.2)

## 2020-07-21 LAB — HEPARIN LEVEL (UNFRACTIONATED): Heparin Unfractionated: 1.4 IU/mL — ABNORMAL HIGH (ref 0.30–0.70)

## 2020-07-21 NOTE — Procedures (Signed)
Tracheostomy Change Note  Patient Details:   Name: LYNNDA WIERSMA DOB: 01-Jun-1961 MRN: [id number]    Airway Documentation:    Evaluation  O2 sats: stable throughout Complications: No apparent complications Patient did tolerate procedure well. Bilateral Breath Sounds: Clear, Diminished     RRT Daneil Dolin with RRT Rudene Re removed #6 Shiley flexible cuffed trach and inserted #4 Shiley flexible uncuffed trach per order. Patient tolerated well. Positive color change noted on ETCO2 detector. BBS clear and diminished. Extra trach and trach equipment at bedside. Vitals are stable. RT will continue to monitor.   Blakelee Allington Clyda Greener 07/21/2020, 2:06 PM

## 2020-07-21 NOTE — Progress Notes (Signed)
Occupational Therapy Treatment Patient Details Name: Carolyn Proctor MRN: [id number] DOB: April 05, 1961 Today's Date: 07/21/2020    History of present illness Patient is a 59 year old female. She presented to the hospital with reports of stroke like sympotms. She had a complicated hospital course which included intubation for a few weeks. She is currently on a trach. PMH: lupus, a-fib, CHF, tachycardia, thrombocytoisis, right ventricular dysfucntion, rhematoid lung disease; RA    OT comments  OT treatment session with focus on bed mobility, static/dynamic sitting balance in prep for seated ADLs, BUE AAROM, and functional transfers. Patient making progress toward goals demonstrating Max A +2 for bed mobility, sit to stand transfers, and stand-pivot transfers to recliner with +2 hand held assist. Patient also able to verbally answer therapists questions with [facility] valve in place with increased cueing. Patient with preference to shake head yes/no. Patient would benefit from continued acute OT services to maximize safety and independence with self-care tasks and decrease caregiver burden in prep for d/c to next level of care.    Follow Up Recommendations  LTACH;Supervision/Assistance - 24 hour    Equipment Recommendations  Other (comment) (Defer to next level of care)    Recommendations for Other Services      Precautions / Restrictions Precautions Precautions: Fall Precaution Comments: trach, watch HR, soft Prevalon boots Restrictions Weight Bearing Restrictions: No       Mobility Bed Mobility Overal bed mobility: Needs Assistance Bed Mobility: Rolling;Sidelying to Sit Rolling: +2 for physical assistance;Max assist Sidelying to sit: +2 for physical assistance;Max assist;HOB elevated Supine to sit: Max assist;+2 for physical assistance;+2 for safety/equipment     General bed mobility comments: Two person max assist to roll partially to R side, progress bil legs over EOB and come  up to sitting EOB.  Hand over hand assist to reach L hand to R rail, but unable to maintain.   Transfers Overall transfer level: Needs assistance Equipment used: 2 person hand held assist Transfers: Sit to/from [facility] Sit to Stand: +2 physical assistance;Max assist Stand pivot transfers: +2 physical assistance;Max assist       General transfer comment: Two person max assist to stand, pt hooking elbows to attempt to use her UEs to assist.  Bil knees blocked by therapist and bed pad used to help facilitate hip extension.  Cues for upright head and chest "look up".  Althought pt takes weight on her feet, she was unable to step around to the chair, therapists manually turned with her.     Balance Overall balance assessment: Needs assistance Sitting-balance support: Feet supported;Bilateral upper extremity supported Sitting balance-Leahy Scale: Zero Sitting balance - Comments: as much as total assist and as good as mod assist to sit EOB with bil UE, single UE prop.  worked from sitting on reaching, cervical ROM, sitting balance, and transitions from R elbow back up to sitting (unable to do left elbow secondary to pain).   Postural control: Posterior lean Standing balance support: Bilateral upper extremity supported Standing balance-Leahy Scale: Zero Standing balance comment: max two person assist.                            ADL either performed or assessed with clinical judgement   ADL Overall ADL's : Needs assistance/impaired  Vision       Perception     Praxis      Cognition Arousal/Alertness: Lethargic Behavior During Therapy: WFL for tasks assessed/performed Overall Cognitive Status: Impaired/Different from baseline Area of Impairment: Attention;Following commands;Problem solving                   Current Attention Level: Selective   Following Commands: Follows one step  commands with increased time     Problem Solving: Slow processing;Decreased initiation General Comments: Patient with PSMV in place with cuff deflated. Able to speak but prefers to don head yes/no.  Followed commands with incresed time, selective attention between the two therapists assisting her today. A little lethergic to start, needing cues to open eyes at times, but perked up EOB until fatigued and positioned comfortably in the recliner, then started dozing again.         Exercises Other Exercises Other Exercises: cervical rotation bil AROM x 5 reps each direction (better to the left).     Shoulder Instructions       General Comments HR into the high 140s during mobility.  All other VSS on 28% TC with PMV donned, cuff deflated, 5L O2.     Pertinent Vitals/ Pain       Pain Assessment: Faces Faces Pain Scale: Hurts a little bit Pain Location: BUE L>R with movement  Pain Descriptors / Indicators: Discomfort Pain Intervention(s): Limited activity within patient's tolerance;Monitored during session;Repositioned  Home Living                                          Prior Functioning/Environment              Frequency  Min 2X/week        Progress Toward Goals  OT Goals(current goals can now be found in the care plan section)  Progress towards OT goals: Progressing toward goals  Acute Rehab OT Goals Patient Stated Goal: none stated, agreeable to working with therapies  OT Goal Formulation: With patient Time For Goal Achievement: 07/30/20 Potential to Achieve Goals: Fair ADL Goals Pt Will Perform Grooming: with min assist;sitting Pt Will Perform Upper Body Bathing: with mod assist;sitting Pt Will Perform Upper Body Dressing: with mod assist;sitting Pt/caregiver will Perform Home Exercise Program: Increased strength;Increased ROM;Both right and left upper extremity;With written HEP provided;With minimal assist Additional ADL Goal #1: Pt will perform  bed mobility with modA as precursor to EOB/OOB ADL. Additional ADL Goal #2: Pt will tolerate approx 5 min activity with VSS prior to requiring rest break.  Plan Discharge plan remains appropriate    Co-evaluation    PT/OT/SLP Co-Evaluation/Treatment: Yes Reason for Co-Treatment: Complexity of the patient's impairments (multi-system involvement);Necessary to address cognition/behavior during functional activity;For patient/therapist safety;To address functional/ADL transfers PT goals addressed during session: Mobility/safety with mobility;Balance;Strengthening/ROM OT goals addressed during session: ADL's and self-care;Strengthening/ROM      AM-PAC OT "6 Clicks" Daily Activity     Outcome Measure   Help from another person eating meals?: Total Help from another person taking care of personal grooming?: A Lot Help from another person toileting, which includes using toliet, bedpan, or urinal?: Total Help from another person bathing (including washing, rinsing, drying)?: Total Help from another person to put on and taking off regular upper body clothing?: Total Help from another person to put on and taking off regular lower body clothing?: Total 6  Click Score: 7    End of Session Equipment Utilized During Treatment: Oxygen  OT Visit Diagnosis: Muscle weakness (generalized) (M62.81);Other abnormalities of gait and mobility (R26.89)   Activity Tolerance Patient tolerated treatment well   Patient Left in chair;with call bell/phone within reach;with chair alarm set;with family/visitor present   Nurse Communication Mobility status        Time: 6435-3912 OT Time Calculation (min): 43 min  Charges: OT General Charges $OT Visit: 1 Visit OT Treatments $Self Care/Home Management : 8-22 mins  Nyara Capell H. OTR/L Supplemental OT, Department of rehab services [phone]   [employer] R H. 07/21/2020, 1:57 PM

## 2020-07-21 NOTE — Progress Notes (Addendum)
[facility] PROGRESS NOTE  ARTELIA GAME WCB:[id number] DOB: [birth year] DOA: 06/22/2020 PCP: Lilian Coma., MD  Status: Inpatient--Remains inpatient appropriate because:Altered mental status, Ongoing diagnostic testing needed not appropriate for outpatient work up, Unsafe d/c plan and Inpatient level of care appropriate due to severity of illness   Dispo: The patient is from: Home              Anticipated d/c is to: LTAC              Anticipated d/c date is: > 3 days              Patient currently is not medically stable to d/c. UNSAFE DC PLAN-patient has been liberated from mechanical ventilation as of 10/4 with subsequent placement of cuffed trach on same date.  Plans are to downsize and transition to cuffless trach on 10/14.  Nuchal findings and current neurological deficits consistent with sequelae of anoxic brain injury   Code Status: Full Family Communication: Patient as well as family member at bedside on 10/14 DVT prophylaxis: Eliquis Vaccination status: Has not been vaccinated against Covid  HPI: 59 year old with PMH paroxysmal atrial fibrillation, DVT/PE on Eliquis, history of heart failure with preserved EF, CKD stage III, rheumatoid arthritis, hypercoagulable state secondary to increased protein S and decreased protein C as well as decreased Antithrombin III, emphysema with pulmonary fibrosis, chronic venous insufficiency with chronic foot ulcers and mixed connective tissue disease.  Patient presented to the ER because she felt like she was having stroke symptoms.  While being treated by EMS she developed a left facial droop and became unresponsive.  Her ventilatory effort was poor therefore she required bag-valve-mask by EMS.  Additional evaluation revealed the patient be profoundly hypoglycemic as well as hypotensive.  On arrival to the ED she was intubated for airway protection.  Initial CT of the head was negative.  Despite administration of D50 she persisted with  refractory hypoglycemia therefore was placed on glucose infusion.  He was admitted by the critical care team.  Because of her history of chronic steroid use and hypotension she was initiated on stress dose steroids.  It was felt that her shock was cardiogenic in nature and she was started on diuresis.  Because of ongoing encephalopathy after admission extubation was delayed.  Tracheostomy was placed on 10/4 and she was eventually transitioned from ventilatory support to trach collar.  She also developed HCAP w/ sputum cultures positive for ESBL positive E. coli in the context of underlying interstitial lung disease and connective tissue disorder as evidenced by acute pneumonitis and pulmonary edema.  She was found to have acute systolic heart failure and heart failure team was consulted.Because of A. fib with RVR and NSVT she required an amiodarone drip.  Of note her initial urine cx as well as her sputum was positive for ESBL positive E. coli and it is likely sepsis physiology contributed to patient's presenting symptoms including hypoxemia with concurrent altered mentation and hypoglycemia resulting in further altered mentation and inability to protect airway.    She was transferred out of ICU to hospitalist service on 10/7 but had a T-max of 102 F on 10/8  therefore she was pancultured to determine source of infection.  Was empirically started on meropenem with plans to continue for total of 14 days.  All cultures have been negative.  Cardiology had been managing her CHF/ Lasix and on 10/11 transitioned her from IV to oral Lasix.  Given her debilitated state right heart  cath not indicated and performing would not change her current medical management at this point. Plan to pursue at a later date.  Patient has been awake and following commands.  She is working with SLP but not tolerating some swallow and PMV secondary size of current trach.  She is currently on tube feedings via a core track but it is hopeful  that if trach can be downsized quickly that she will pass swallowing evaluations and will not require a PEG tube.   Subjective: Patient alert and essentially oriented.  She did mistakenly state that it was 1921 although when asked what month it was she rapidly responded October.  Objective: Vitals:   07/21/20 1119 07/21/20 1203  BP: 112/69 112/72  Pulse: 95 95  Resp: (!) 26 (!) 23  Temp:  98.5 F (36.9 C)  SpO2: 97% 99%    Intake/Output Summary (Last 24 hours) at 07/21/2020 1233 Last data filed at 07/21/2020 1216 Gross per 24 hour  Intake 100 ml  Output 4225 ml  Net -4125 ml   Filed Weights   07/20/20 0352 07/21/20 0000 07/21/20 0328  Weight: 57.2 kg 54 kg 54 kg    Exam:  Constitutional: NAD, calm, comfortable Eyes: PERRL, lids and conjunctivae normal ENMT: Mucous membranes are moist. Posterior pharynx clear of any exudate or lesions.Normal dentition.  Neck: normal, supple, no masses, no thyromegaly; midline trach in place #6 Shiley cuffed-cuff deflated and PMV in place Respiratory: clear to auscultation bilaterally, no wheezing, no crackles. Normal respiratory effort. No accessory muscle use.  Cardiovascular: Regular rate and rhythm, no murmurs / rubs / gallops. No extremity edema. 2+ pedal pulses. No carotid bruits.  Abdomen: no tenderness, no masses palpated. No hepatosplenomegaly. Bowel sounds positive.  Has rectal tube in place with large volume dark brown liquid stool in drainage bag. Musculoskeletal: no clubbing / cyanosis. No joint deformity upper and lower extremities. Good ROM, no contractures. Normal muscle tone.  Skin: Unremarkable except for areas of blackened skin at the distal tips of fingers of hands and on the toes which patient reports are painful to touch.  Has Prevalon boots in place so unable to adequately assess skin of feet, ankles or tibia region. Neurologic: CN 2-12 grossly intact. Sensation intact, DTR not assessed strength 4/5 right upper extremity,  bilateral lower extremities are extremely weak with patient unable to lift legs off the bed so strength is about 1-2/5.  Shoulder strength is 3/5 laterally.  Left upper extremity extremely weak especially distally with strength ~1/5. Psychiatric: Normal judgment and insight. Alert and oriented x 3 although did miss year stating 1921 but was able to regroup and state 2021.Marland Kitchen Normal mood.    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 -Has remained stable on trach collar with a #6 cuffed Shiley trach-trach team consulted and patient will be changed out to a #4 cuffless trach on 10/14 -Completed meropenem but in context of recurrent fevers this was resumed on 07/14/2020 recommendation from PCCM was to continue for 14 days total -On chronic prednisone at home 5 mg daily.  Was started on prednisone 40 mg daily for acute pneumonitis in setting of ILD on 07/05/2020.  Dose decreased to 30 mg daily on 10/14 with recommendation to decrease by 10 mg weekly until at home dose. -Dr. Lake Bells discussed with her outpatient rheumatologist and  they feel that this is a vasculitis picture and given her underlying infections and they cannot use more aggressive immunosuppression other than prednisone at this time -Given trach placed on 10/4 would not be a candidate for SNF until 11/4.  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 Ephriam Jenkins with [facility]). - previously treated with rituximab >> last in November 2020 - defer further immunosuppressive therapy while she is being treated for infection -She will need close rheumatology follow-up after  discharge.   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 Atrial flutter/atrial tachycardia with runs of NSVT - new this admission. - advanced heart failure team following intermittently; initially she was in cardiogenic shock due to right ventricular failure/cor pulmonale -echo repeated -improved with milrinone and diuresis-only on oral Lasix as of 10/11 -Had additional shock physiology secondary to sepsis from ESBL E. coli pneumonia antibiotics subsequently changed from cefepime to [facility] physiology has resolved and patient remains stable - continue amiodarone 200 mg daily for suppression of RVR and NSVT; EF 35 to 40%-currently not on beta-blockers or ARB/ACE I -(-) 5600 cc since admission -continue strict I's/O -Cardiology documented that a right heart cath would not change management at this point and they feel that she needs to be more stable therefore can pursue this later. -Continue Eliquis for stroke prophylaxis  Acute metabolic encephalopathy from hypoglycemia, hypotension, hypoxia. Suspected anoxic brain injury Seizure in setting of hypoxia with respiratory failure. -Actively improved and currently alert and oriented -Noted patient with left upper extremity weakness which apparently was not present prior to admission therefore suspect some degree of anoxic brain injury -continue Keppra-check levetiracetam level in am 10/15 -Delirium precautions -Brain MRI with no evidence of vasculitis   Severe physical deconditioning -Secondary to above -Hopeful for LTAC placement although if continues to improve may be eligible to be evaluated by inpatient rehab -Continue PT and OT -Continue Prevalon boots  Pulmonary Embolism in June 2021. -Continue Eliquis  Bilateral Pressure Ulcers - Wound Consult - BID Dressing Change w/ silvadene Wound / Incision (Open or Dehisced) 07/13/20 (MASD) Moisture Associated Skin Damage  Buttocks Right;Left Skin excoriated on buttocks (Active)  Date First Assessed/Time First Assessed: 07/13/20 1200   Wound Type: (MASD) Moisture Associated Skin Damage  Location: Buttocks  Location Orientation: Right;Left  Wound Description (Comments): Skin excoriated on buttocks  Present on Admission: No    Assessments 07/13/2020  2:00 PM 07/20/2020  9:56 PM  Dressing Type Foam - Lift dressing to assess site every shift Foam - Lift dressing to assess site every shift  Dressing Changed New Changed  Dressing Status Clean;Dry;Intact Clean;Dry;Intact  Dressing Change Frequency PRN PRN  Site / Wound Assessment Red Pink  Peri-wound Assessment Excoriated Intact  Margins Attached edges (approximated) --  Closure None --  Drainage Amount Scant None  Drainage Description No odor;Serous --  Non-staged Wound Description Not applicable --  Treatment Cleansed Cleansed;Irrigation     No Linked orders to display     Wound / Incision (Open or Dehisced) 07/14/20 (MASD) Moisture Associated Skin Damage Thigh Anterior;Right;Left (Active)  Date First Assessed/Time First Assessed: 07/14/20 2207   Wound Type: (MASD) Moisture Associated Skin Damage  Location: Thigh  Location Orientation: Anterior;Right;Left  Present on Admission: No    Assessments 07/14/2020 10:07 PM 07/20/2020  9:56  PM  Dressing Type None None  Site / Wound Assessment Pink --  Closure None --  Drainage Amount Minimal --  Drainage Description Serous --  Treatment Cleansed Cleansed     No Linked orders to display     Wound / Incision (Open or Dehisced) 07/19/20 Non-pressure wound Pretibial Distal;Left (Active)  Date First Assessed/Time First Assessed: 07/19/20 1100   Wound Type: Non-pressure wound  Location: Pretibial  Location Orientation: Distal;Left  Present on Admission: (c)     Assessments 07/20/2020 11:34 AM 07/20/2020  9:56 PM  Dressing Type -- Non adherent;Gauze (Comment)  Dressing Changed -- (S) New  Dressing Status --  Clean;Dry;Intact  Dressing Change Frequency -- Daily  Site / Wound Assessment -- Pink;Granulation tissue  Peri-wound Assessment -- Intact  Wound Length (cm) 3 cm --  Wound Width (cm) 3 cm --  Wound Surface Area (cm^2) 9 cm^2 --  Margins -- Unattached edges (unapproximated)  Drainage Amount -- None     No Linked orders to display     Wound / Incision (Open or Dehisced) 07/19/20 Non-pressure wound Pretibial Distal;Right (Active)  Date First Assessed/Time First Assessed: 07/19/20 1100   Wound Type: Non-pressure wound  Location: Pretibial  Location Orientation: Distal;Right  Present on Admission: (c)     Assessments 07/20/2020 11:33 AM 07/20/2020  9:56 PM  Dressing Type Non adherent;Gauze (Comment) Non adherent;Gauze (Comment)  Dressing Changed Changed (S) Changed  Dressing Status Clean;Dry;Intact Clean;Dry;Intact  Dressing Change Frequency Daily Daily  Site / Wound Assessment Friable;Pink;Yellow --  Peri-wound Assessment Intact Intact  Margins Unattached edges (unapproximated) Unattached edges (unapproximated)  Closure None --  Drainage Amount Minimal None  Drainage Description Purulent;No odor --  Treatment -- Cleansed     No Linked orders to display    Hypernatremia (Resolved)/hyponatremia/hyperkalemia -Continue to follow labs periodically  ?Liver cirrhosis/Abnormal LFTs, -LFTs normal now. -Continues to have significant diarrhea/watery stools therefore will check ammonia level to determine if lactulose dose needs to be adjusted or discontinued -Patient was not on Chronulac or lactulose prior to admission -CT abdomen and pelvis 1 year ago without any evidence of cirrhosis -CTA that included abdomen during this admission revealed liver contour somewhat nodular and there may be a component of underlying cirrhosis. -Suspect this is more reflective of passive hepatic congestion from patient's severe heart failure and presentation with acute systolic heart failure than of true  cirrhosis  Steroid Induced Hyperglycemia. -Resolved  Macrocytic Anemia -Stable.  Monitor. -Iron normal at 52 with an elevated ferritin on 10/11  Chest Pain, rule out ACS;chest pain has completely resolved -Chest pain a few days ago which is now completely resolved -EKG was relatively unremarkable and nonischemic; -Troponin was initially elevated likely demand ischemia in the setting of her nausea vomiting and her current comorbidities  Thrombocytosis -Reactive and stable.  She is already on anticoagulant.  Dysphagia/severe Protein Calorie Malnutrition. -On dysphagia 1 diet but not eating much so supplemented by tube feedings. -Hopefully with downsizing of trach patient will be able to tolerate swallowing better and can eventually have core track tube discontinued and avoid PEG tube placement    Data Reviewed: Basic Metabolic Panel: Recent Labs  Lab 07/16/20 0506 07/17/20 0500 07/18/20 0536 07/19/20 1117 07/20/20 0408  NA 136 128* 133* 133* 132*  K 5.4* 5.1 5.4* 5.0 5.2*  CL 105 96* 100 100 99  CO2 '24 23 26 25 26  ' GLUCOSE 162* 257* 115* 99 91  BUN 39* 39* 41* 41* 41*  CREATININE 1.08* 1.10* 1.00  1.04* 1.16*  CALCIUM 8.2* 7.7* 8.0* 7.7* 8.2*  MG 1.9 2.0 2.0 1.9 2.0  PHOS 3.4 3.1 3.7 3.1 3.1   Liver Function Tests: Recent Labs  Lab 07/16/20 0506 07/17/20 0500 07/18/20 0536 07/19/20 1117 07/20/20 0408  AST 33 36 30 33 30  ALT 53* 53* 39 37 35  ALKPHOS 199* 205* 211* 213* 187*  BILITOT 0.9 0.8 0.7 0.7 0.9  PROT 5.7* 5.0* 5.4* 5.4* 5.5*  ALBUMIN 1.5* 1.4* 1.4* 1.6* 1.6*   No results for input(s): LIPASE, AMYLASE in the last 168 hours. No results for input(s): AMMONIA in the last 168 hours. CBC: Recent Labs  Lab 07/15/20 0517 07/15/20 0517 07/16/20 0506 07/16/20 0506 07/17/20 0500 07/18/20 0536 07/19/20 0358 07/20/20 0408 07/21/20 0440  WBC 16.8*   < > 15.4*   < > 14.8* 16.9* 15.7* 17.5* 17.2*  NEUTROABS 14.7*  --  13.3*  --  11.9* 13.4*  --   12.6*  --   HGB 11.0*   < > 10.8*   < > 9.8* 10.8* 10.1* 10.1* 10.9*  HCT 35.3*   < > 35.1*   < > 32.3* 34.3* 33.6* 33.0* 34.9*  MCV 106.6*   < > 106.0*   < > 106.3* 106.5* 105.3* 106.1* 107.1*  PLT 314   < > 373   < > 425* 485* 512* 531* 591*   < > = values in this interval not displayed.   Cardiac Enzymes: No results for input(s): CKTOTAL, CKMB, CKMBINDEX, TROPONINI in the last 168 hours. BNP (last 3 results) No results for input(s): BNP in the last 8760 hours.  ProBNP (last 3 results) No results for input(s): PROBNP in the last 8760 hours.  CBG: Recent Labs  Lab 07/20/20 2059 07/21/20 0052 07/21/20 0309 07/21/20 0822 07/21/20 1224  GLUCAP 88 104* 111* 108* 102*    Recent Results (from the past 240 hour(s))  Culture, Urine     Status: None   Collection Time: 07/14/20  1:15 PM   Specimen: Urine, Random  Result Value Ref Range Status   Specimen Description URINE, RANDOM  Final   Special Requests NONE  Final   Culture   Final    NO GROWTH Performed at [facility], [place] [address], [place], [place] [address]    Report Status 07/15/2020 FINAL  Final  Culture, blood (routine x 2)     Status: None   Collection Time: 07/14/20  2:06 PM   Specimen: BLOOD LEFT ARM  Result Value Ref Range Status   Specimen Description BLOOD LEFT ARM  Final   Special Requests   Final    BOTTLES DRAWN AEROBIC ONLY Blood Culture adequate volume   Culture   Final    NO GROWTH 5 DAYS Performed at [facility], [place] [address], [place], [place] [address]    Report Status 07/19/2020 FINAL  Final  Culture, blood (routine x 2)     Status: None   Collection Time: 07/14/20  2:10 PM   Specimen: BLOOD LEFT HAND  Result Value Ref Range Status   Specimen Description BLOOD LEFT HAND  Final   Special Requests   Final    BOTTLES DRAWN AEROBIC ONLY Blood Culture adequate volume   Culture   Final    NO GROWTH 5 DAYS Performed at [facility], [place] [address] [place], [place] [address]     Report Status 07/19/2020 FINAL  Final     Studies: DG CHEST PORT 1 VIEW  Result Date: 07/20/2020 CLINICAL DATA:  Tracheostomy. EXAM: PORTABLE CHEST 1 VIEW COMPARISON:  July 18, 2020. FINDINGS: Stable cardiomegaly. Tracheostomy and feeding tubes are unchanged in position. Right-sided PICC line is unchanged. No pneumothorax is noted. Minimal bibasilar subsegmental atelectasis is noted. No significant pleural effusion is noted. Bony thorax is unremarkable. IMPRESSION: Stable support apparatus. Minimal bibasilar subsegmental atelectasis. Electronically Signed   By: Marijo Conception M.D.   On: 07/20/2020 08:36    Scheduled Meds: . amiodarone  200 mg Per Tube Daily  . apixaban  5 mg Per Tube BID  . chlorhexidine gluconate (MEDLINE KIT)  15 mL Mouth Rinse BID  . Chlorhexidine Gluconate Cloth  6 each Topical Q0600  . feeding supplement (PROSource TF)  45 mL Per Tube Daily  . furosemide  20 mg Per Tube Daily  . influenza vac split quadrivalent PF  0.5 mL Intramuscular Tomorrow-1000  . insulin aspart  0-9 Units Subcutaneous Q4H  . insulin aspart  3 Units Subcutaneous Q4H  . lactulose  30 g Per Tube Daily  . levETIRAcetam  500 mg Per Tube BID  . mouth rinse  15 mL Mouth Rinse 10 times per day  . pantoprazole sodium  40 mg Per Tube Daily  . predniSONE  30 mg Per NG tube Q breakfast  . silver sulfADIAZINE   Topical BID  . sodium chloride flush  10-40 mL Intracatheter Q12H   Continuous Infusions: . sodium chloride 10 mL (07/17/20 2300)  . feeding supplement (OSMOLITE 1.5 CAL) 1,000 mL (07/21/20 1144)  . meropenem (MERREM) IV 2 g (07/21/20 0443)    Active Problems:   Pulmonary HTN (HCC)   Acute encephalopathy   Acute respiratory failure (HCC)   Oxygen desaturation   Cardiogenic shock (HCC)   Acute renal failure with tubular necrosis (HCC)   RVF (right ventricular failure) (HCC)   E. coli UTI   Protein-calorie malnutrition, severe   Acute respiratory distress syndrome (ARDS) due to  COVID-19 virus [facility])   Anoxic brain injury ([place])   Tracheostomy status [facility])   Consultants:  PCCM Transfer  Cardiology  Neurology  Procedures: 9/15 ETT >> 9/15 R IJ TL CVC >>9/27 9/27 PICC > 10/4 Trach 10/14 downsized to 4.0 cuffless trach  Antibiotics: Anti-infectives (From admission, onward)   Start     Dose/Rate Route Frequency Ordered Stop   07/14/20 1400  meropenem (MERREM) 2 g in sodium chloride 0.9 % 100 mL IVPB        2 g 200 mL/hr over 30 Minutes Intravenous Every 8 hours 07/14/20 1339     07/06/20 1400  meropenem (MERREM) 2 g in sodium chloride 0.9 % 100 mL IVPB        2 g 200 mL/hr over 30 Minutes Intravenous Every 8 hours 07/06/20 1124 07/12/20 2230   07/05/20 1000  vancomycin (VANCOREADY) IVPB 500 mg/100 mL  Status:  Discontinued        500 mg 100 mL/hr over 60 Minutes Intravenous Every 12 hours 07/04/20 2127 07/05/20 0902   07/04/20 2200  ceFEPIme (MAXIPIME) 2 g in sodium chloride 0.9 % 100 mL IVPB  Status:  Discontinued        2 g 200 mL/hr over 30 Minutes Intravenous Every 12 hours 07/04/20 0929 07/06/20 1124   07/04/20 2200  vancomycin (VANCOREADY) IVPB 500 mg/100 mL  Status:  Discontinued        500 mg 100 mL/hr over 60 Minutes Intravenous Every 12 hours 07/04/20 1149 07/04/20 2127   07/04/20 2200  vancomycin (VANCOCIN) IVPB 1000 mg/200 mL premix        1,000 mg 200 mL/hr over 60 Minutes Intravenous  Once 07/04/20 2127 07/05/20 0032   07/04/20 0930  vancomycin (VANCOCIN) IVPB 1000 mg/200 mL premix  Status:  Discontinued        1,000 mg 200 mL/hr over 60 Minutes Intravenous  Once 07/04/20 0929 07/04/20 2127   07/04/20 0930  vancomycin (VANCOREADY) IVPB 500 mg/100 mL  Status:  Discontinued        500 mg 100 mL/hr over 60 Minutes Intravenous Every 12 hours 07/04/20 0929 07/04/20 1149   06/23/20 1015  vancomycin (VANCOREADY) IVPB 750 mg/150 mL  Status:  Discontinued        750 mg 150 mL/hr over 60 Minutes Intravenous Every 12 hours 06/23/20 1007  06/24/20 0849   06/23/20 1000  piperacillin-tazobactam (ZOSYN) IVPB 3.375 g        3.375 g 12.5 mL/hr over 240 Minutes Intravenous Every 8 hours 06/23/20 0948 06/28/20 0805   06/22/20 1230  vancomycin (VANCOREADY) IVPB 1250 mg/250 mL        1,250 mg 166.7 mL/hr over 90 Minutes Intravenous  Once 06/22/20 1226 06/22/20 1535   06/22/20 1230  piperacillin-tazobactam (ZOSYN) IVPB 3.375 g        3.375 g 100 mL/hr over 30 Minutes Intravenous  Once 06/22/20 1226 06/22/20 1502        Time spent: [place]  [facility] Pager [phone]. If 7PM-7AM, please contact night-coverage at [email or web address] 07/21/2020, 12:33 PM  LOS: 29 days

## 2020-07-21 NOTE — Progress Notes (Signed)
Nutrition Follow-up  DOCUMENTATION CODES:   Severe malnutrition in context of chronic illness  INTERVENTION:  Continue TF via Cortrak:  -Osmolite 1.5 cal @ 83m/hr (14470m -4519mrosource TF daily  Provides 2200 kcals, 101 grams protein, 1097m74mee water   Recommend continuing to meet 100% of pt's needs via TF until pt can tolerate more po. RD will monitor intake and adjust TF regimen as appropriate.  NUTRITION DIAGNOSIS:   Severe Malnutrition related to chronic illness as evidenced by severe muscle depletion, energy intake < or equal to 75% for > or equal to 1 month, moderate fat depletion, edema.  Ongoing  GOAL:   Patient will meet greater than or equal to 90% of their needs  Met with TF  MONITOR:   TF tolerance, I & O's  REASON FOR ASSESSMENT:   Consult, Ventilator Enteral/tube feeding initiation and management  ASSESSMENT:   Pt with PMH of PAF, DVT and PE dx 03/2019 on Eliquis, RA, HF, centrilobular emphysema, pulmonary fibrosis on daily prednisone, and chronic venous insufficiency with chronic foot ulcers.Admitted with cardiogenic shock due to severe RV failure/cor pulmonale CTD flare.  09/15 Admitted  10/01 Cortrak placed (gastric) 10/04 Trach placed 10/08 episode of emesis while being suctioned 10/11 failed BSE 10/12 diet advanced to dysphagia 1 with thin liquids s/p MBS  Per MD, insurance authorization process initiated yesterday as LT[facility] will have a bed early next week.  Although pt's diet was advanced, her intake has been minimal -- 5% x 1 documented meal. Discussed with RN who reports pt only taking bites today. Due to poor intake, pt continues to receive TF via Cortrak. Currently receiving Osmolite 1.5 cal @ 60ml71mwith 45ml 26mource TF daily. Recommend pt continue to have 100% of needs met via TF until po intake improves. RD will monitor intake and adjust TF regimen as appropriate.  UOP: 4525ml x5mours Stool: 500ml x255murs I/O: -14,135.7ml  sin7madmit  Labs: Na 132 (L),K+ 5.2 (H), CBGs [phone]: Lasix, ss Novolog, 3 units Novolog Q4H, Chronulac, Protonix, Deltasone    Diet Order:   Diet Order            DIET - DYS 1 Room service appropriate? No; Fluid consistency: Thin  Diet effective now                 EDUCATION NEEDS:   No education needs have been identified at this time  Skin:  Skin Assessment: Skin Integrity Issues: Skin Integrity Issues:: Other (Comment), Stage II Stage II: N/A Other: non-pressure wounds R/L pretibial; MASD thigh and buttocks  Last BM:  10/13 500ml via 67mal tube  Height:   Ht Readings from Last 1 Encounters:  06/22/20 '5\' 11"'  (1.803 m)    Weight:   Wt Readings from Last 1 Encounters:  07/21/20 54 kg    Ideal Body Weight:  70.4 kg  BMI:  Body mass index is 16.6 kg/m.  Estimated Nutritional Needs:   Kcal:  1920-2240 4982-6415otein:  100-130 grams  Fluid:  >/= 1.8 L    Savas Elvin AveLarkin InaLDN RD pager number and weekend/on-call pager number located in [email or web address]

## 2020-07-21 NOTE — Progress Notes (Signed)
07/21/2020 PT/OT co session focused on sitting balance, standing and sit to stand transfers for OOB to chair time.  HR increased to upper 140s while other VSS on 28% TC.  PMV donned with cuff deflated the entire session.  Pt needed to be encouraged to vocalize.  She fatigued easily, but demonstrated improved postural control and tolerance to mobility today.  Her Aunt was in the room throughout our session.   Carolyn Proctor, PT, DPT  Acute Rehabilitation [phone] pager [phone] office        07/21/20 1341  PT Visit Information  Last PT Received On 07/21/20  Assistance Needed +2  PT/OT/SLP Co-Evaluation/Treatment Yes  Reason for Co-Treatment Complexity of the patient's impairments (multi-system involvement);Necessary to address cognition/behavior during functional activity;For patient/therapist safety;To address functional/ADL transfers  PT goals addressed during session Mobility/safety with mobility;Balance;Strengthening/ROM  History of Present Illness Patient is a 59 year old female. She presented to the hospital with reports of stroke like sympotms. She had a complicated hospital course which included intubation for a few weeks. She is currently on a trach. PMH: lupus, a-fib, CHF, tachycardia, thrombocytoisis, right ventricular dysfucntion, rhematoid lung disease; RA   Precautions  Precautions Fall  Precaution Comments trach, watch HR, soft Prevalon boots  Pain Assessment  Pain Assessment Faces  Faces Pain Scale 2  Pain Location BUE L>R with movement   Pain Descriptors / Indicators Discomfort  Pain Intervention(s) Limited activity within patient's tolerance;Monitored during session;Repositioned  Cognition  Arousal/Alertness Lethargic  Behavior During Therapy WFL for tasks assessed/performed  Overall Cognitive Status Impaired/Different from baseline  Area of Impairment Attention;Following commands;Problem solving  Current Attention Level Selective  Following Commands  Follows one step commands with increased time  Problem Solving Slow processing;Decreased initiation  General Comments Patient with PSMV in place with cuff deflated. Able to speak but prefers to don head yes/no.  Followed commands with incresed time, selective attention between the two therapists assisting her today. A little lethergic to start, needing cues to open eyes at times, but perked up EOB until fatigued and positioned comfortably in the recliner, then started dozing again.   Difficult to assess due to Tracheostomy  Bed Mobility  Overal bed mobility Needs Assistance  Bed Mobility Rolling;Sidelying to Sit  Rolling +2 for physical assistance;Max assist  Sidelying to sit +2 for physical assistance;Max assist;HOB elevated  General bed mobility comments Two person max assist to roll partially to R side, progress bil legs over EOB and come up to sitting EOB.  Hand over hand assist to reach L hand to R rail, but unable to maintain.   Transfers  Overall transfer level Needs assistance  Equipment used 2 person hand held assist  Transfers Sit to/from [employer] Transfers  Sit to Stand +2 physical assistance;Max assist  Stand pivot transfers +2 physical assistance;Max assist  General transfer comment Two person max assist to stand, pt hooking elbows to attempt to use her UEs to assist.  Bil knees blocked by therapist and bed pad used to help facilitate hip extension.  Cues for upright head and chest "look up".  Althought pt takes weight on her feet, she was unable to step around to the chair, therapists manually turned with her.   Ambulation/Gait  General Gait Details unable at this time.   Balance  Overall balance assessment Needs assistance  Sitting-balance support Feet supported;Bilateral upper extremity supported  Sitting balance-Leahy Scale Zero  Sitting balance - Comments as much as total assist and as good as mod  assist to sit EOB with bil UE, single UE prop.  worked from sitting on  reaching, cervical ROM, sitting balance, and transitions from R elbow back up to sitting (unable to do left elbow secondary to pain).    Postural control Posterior lean  Standing balance support Bilateral upper extremity supported  Standing balance-Leahy Scale Zero  Standing balance comment max two person assist.   General Comments  General comments (skin integrity, edema, etc.) HR into the high 140s during mobility.  All other VSS on 28% TC with PMV donned, cuff deflated, 5L O2.   Other Exercises  Other Exercises cervical rotation bil AROM x 5 reps each direction (better to the left).    PT - End of Session  Equipment Utilized During Treatment Oxygen  Activity Tolerance Patient limited by fatigue;Patient limited by pain  Patient left in chair;with call bell/phone within reach;with chair alarm set;with family/visitor present (her aunt in the room. )   PT - Assessment/Plan  PT Plan Current plan remains appropriate  PT Visit Diagnosis Other abnormalities of gait and mobility (R26.89);Muscle weakness (generalized) (M62.81);Difficulty in walking, not elsewhere classified (R26.2)  PT Frequency (ACUTE ONLY) Min 2X/week  Follow Up Recommendations LTACH  PT equipment Wheelchair (measurements PT);Wheelchair cushion (measurements PT);Hospital bed;3in1 (PT)  AM-PAC PT "6 Clicks" Mobility Outcome Measure (Version 2)  Help needed turning from your back to your side while in a flat bed without using bedrails? 1  Help needed moving from lying on your back to sitting on the side of a flat bed without using bedrails? 1  Help needed moving to and from a bed to a chair (including a wheelchair)? 1  Help needed standing up from a chair using your arms (e.g., wheelchair or bedside chair)? 1  Help needed to walk in hospital room? 1  Help needed climbing 3-5 steps with a railing?  1  6 Click Score 6  Consider Recommendation of Discharge To: CIR/SNF/LTACH  PT Goal Progression  Progress towards PT goals  Progressing toward goals  Acute Rehab PT Goals  PT Goal Formulation Patient unable to participate in goal setting  Time For Goal Achievement 07/30/20  Potential to Achieve Goals Fair  PT Time Calculation  PT Start Time (ACUTE ONLY) 1038  PT Stop Time (ACUTE ONLY) 1121  PT Time Calculation (min) (ACUTE ONLY) 43 min  PT General Charges  $$ ACUTE PT VISIT 1 Visit  PT Treatments  $Therapeutic Activity 23-37 mins

## 2020-07-21 NOTE — Progress Notes (Signed)
NAME:  Carolyn Proctor, MRN:  [id number], DOB:  06-02-1961, LOS: 67 ADMISSION DATE:  06/22/2020, CONSULTATION DATE:  9/15 REFERRING MD:  Tyrone Nine, CHIEF COMPLAINT:  Confusion, refractory hypoglycemia   Brief History   57 yoF at home who complained to family she felt like she was having a stroke, then developed left facial droop and went unresponsive.  Required BVM by EMS and noted to be hypoglycemic and hypotensive.  Intubated in ER for airway protection.  CTH negative.  Refractory hypoglycemia despite multiple D50 amps and required a drip.  PCCM called to admit.   [facility] hospitalist became primary for patient 10/7 and at that time PCCM began seeing patient once weekly for trach care.  We were called back 10/8 due to change in clinical picture.  Per RT report patient had episode of significant emesis after tracheal suction morning of 10/8.  This resulted in short-term tachypnea and increased supplemental oxygen demand.  On bedside assessment 10/8 patient is comfortably lying in bed on 20% aerosolized trach collar in no acute respiratory distress.  Past Medical History  PAF, DVT, PE on Eliquis HFpEF CKD stage III RA Hypercoagulable state (increased protein S antigen, decreased protein C activity, and decreased Antithrombin III) Centrilobular emphysema and pulmonary fibrosis- non smoker Chronic venous insufficiency w/ chronic foot ulcers  Diverticulitis   Significant Hospital Events   9/15 admitted/ intubated >> pulse dose steroids, diuresis, cardiogenic shock support 9/25 on full vent support, encephalopathy precluding extubation 9/26 not weaning, encephalopathic, dysautonomia, off milrinonoe 10/4 Trach    Consults:  Neurology Advanced heart failure  Procedures:  9/15 ETT >> 9/15 R IJ TL CVC >>9/27 9/27 PICC >  10/4 Trach  Significant Diagnostic Tests:   9/15 CTH >> negative  9/15 CTA chest/ abd/ pelvis >> significant dilatation of RA and RV consistent with R heart failure;  moderate R and small L pleural effusions with associated atelectasis of both lower lobes; underlying chronic cystic and fibrotic lung disease; severe anasarca; nodular contour of liver suggestive of possible cirrhosis  9/15 Echo> LVEF 35-40%, mod decreased LV function, RVSP 75, severely reduced RV systolic function; R to left shunt noted, suggestive of PFO, RA severely dilated, mod pericardial effusion  9/15 CTA head and neck >> no significant carotid or vertebral artery stenosis in neck, no LVO, right jugular central venous catheter with probable surrounding hematoma   9/18 MR BRAIN>> no acute intracranial abnormality  9/28 MRI brain > diffuse pachymeningeal enhancement, bilateral cerebral convexities, sub centimeter extra axial focus of T1 hyperintensity along R temporal occiptal lobe, new  Micro Data:  9/15 BCx2 central line >> NG5D 9/15 BCx2 PIV >> Strept mitis 9/15 SARS2 >> negative 9/15 UC >> ESBL E coli 9/16 Trach asp cult >> Normal flora 9/27 blood > negative 9/27 resp > e coli ESBL  Antimicrobials:  9/15 vanc >> 9/17 9/15 zosyn >> 9/20  9/27 vanc > 9/28 9/27 cefepime > 9/29 9/29 meropenem > 10/5  Interim history/subjective:    Objective   Blood pressure 112/69, pulse 90, temperature 98 F (36.7 C), temperature source Oral, resp. rate 17, height 5\' 11"  (1.803 m), weight 54 kg, last menstrual period 02/19/2013, SpO2 98 %. CVP:  [5 mmHg] 5 mmHg  FiO2 (%):  [28 %] 28 %   Intake/Output Summary (Last 24 hours) at 07/21/2020 1107 Last data filed at 07/21/2020 0535 Gross per 24 hour  Intake 100 ml  Output 2925 ml  Net -2825 ml   Filed Weights   07/20/20  1610 07/21/20 0000 07/21/20 0328  Weight: 57.2 kg 54 kg 54 kg    Examination: General this is a 59 year old black female who is up sitting in chair. Just completed standing w/ assistance from PT so currently working on catching her breath HENT NCAT the size 6 cuffless trach is unremarkable. She does have very breathy  phonation w/ PMV in place and does seem to have some excess upper airway noises suggesting some degree of occlusion when finger occluding trach Pulm humidified room air. Some rhonchi. No accessory use No sig tracheal secretions. No accessory use Card rrr abd soft not tender  Ext warm skin is tight. Has necrotic finger tips Neuro withdrawn some. Will interact. Moves all ext. Poor trunk strength   Resolved Hospital Problem list   ESBL E. 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 Ephriam Jenkins with [facility]). Dysphagia  DM  Physical deconditioning Cirrhosis Macrocytic anemia Thrombocytosis Pressure ulcers PE (June 2021) on DOAC Acute metabolic encephalopathy    pulm problem list  Tracheostomy dependence s/p prolonged critical illness ESBL producing E-coli HCAP Acute pneumonitis Dysphagia   Discussion  Doing well w/ dysphagia diet. Breathy vocalization w/ PMV. No need for sxn. Handling secretions. I agree w/ SLP time to downsize   Plan/rec Down size to 4 cuffless and encourage PMV as much as possible (but remove at HS) Complete 14d meropenem May be able to work towards capping trials in near future but I suspect that may take some time still Will see her in am again to see how she tolerated change   Best practice:  Per primary    Signature:  Erick Colace ACNP-BC [place] Pager # [phone] OR # [phone] if no answer

## 2020-07-21 NOTE — Progress Notes (Signed)
  Speech Language Pathology Treatment: Dysphagia;Passy Muir Speaking valve  Patient Details Name: Carolyn Proctor MRN: [id number] DOB: 1961/02/28 Today's Date: 07/21/2020 Time: 0254-2706 SLP Time Calculation (min) (ACUTE ONLY): 14 min  Assessment / Plan / Recommendation Clinical Impression  Pt sitting in room with a family visitor, she did not have a breakfast ordered so SLP offered a chocolate shake, water and yogurt. She drank the shake and the water with a chin tuck and double swallows with minimal cueing. She was able to self feed. With PMSV in place pt demonstrates breathy vocallization, suspect decreased passage to air past deflated cuff, 6 shiley. Pt would benefit from a smaller cuffless trach to progress toward PMSV all waking hours and potential capping trials. Instructed her family member in safety precautions with PMSV, how to place valve and remove. She return demonstrated. Left them chatting in the room together. Pt to wear PMSV during PO intake, with full supervision.   HPI HPI:  Patient is a 59 year old female. She presented to the hospital with reports of stroke like, left facial droop and unresponsiveness. She has had a complicated hospital stay which included intubation from 9/15-10/4 with trach placed on 10/4.  PMH: lupus, a-fib, CHF, tachycardia, thrombocytosis, right ventricular dysfunction, rheumatoid lung disease, RA. CT hyead and MRI 9/15 and 9/18 were negative for acute changes. MRI brain 9/28: bilateral cerebral convexities measuring up to 3 mm in greatest thickness. Appears to predominantly correspond with diffuse pachymeningeal dural thickening and enhancement. However, small bilateral subdural effusions difficult to exclude.       SLP Plan  Continue with current plan of care       Recommendations  Diet recommendations: Thin liquid;Dysphagia 1 (puree) Liquids provided via: Cup;Straw Medication Administration: Crushed with puree Supervision: Staff to assist with self  feeding Compensations: Slow rate;Small sips/bites      Patient may use Passy-Muir Speech Valve: Intermittently with supervision;During PO intake/meals;Caregiver trained to provide supervision PMSV Supervision: Full MD: Please consider changing trach tube to : Smaller size;Cuffless         Plan: Continue with current plan of care       GO               Carolyn [place], MA [place] Pager [phone] Office [phone]  Carolyn Proctor 07/21/2020, 9:47 AM

## 2020-07-22 ENCOUNTER — Inpatient Hospital Stay (HOSPITAL_COMMUNITY): Payer: BC Managed Care – PPO

## 2020-07-22 DIAGNOSIS — R570 Cardiogenic shock: Secondary | ICD-10-CM | POA: Diagnosis not present

## 2020-07-22 DIAGNOSIS — R6521 Severe sepsis with septic shock: Secondary | ICD-10-CM | POA: Diagnosis not present

## 2020-07-22 DIAGNOSIS — A419 Sepsis, unspecified organism: Secondary | ICD-10-CM | POA: Diagnosis not present

## 2020-07-22 DIAGNOSIS — Z930 Tracheostomy status: Secondary | ICD-10-CM | POA: Diagnosis not present

## 2020-07-22 LAB — CBC
HCT: 37.9 % (ref 36.0–46.0)
Hemoglobin: 11.6 g/dL — ABNORMAL LOW (ref 12.0–15.0)
MCH: 32.7 pg (ref 26.0–34.0)
MCHC: 30.6 g/dL (ref 30.0–36.0)
MCV: 106.8 fL — ABNORMAL HIGH (ref 80.0–100.0)
Platelets: 643 10*3/uL — ABNORMAL HIGH (ref 150–400)
RBC: 3.55 MIL/uL — ABNORMAL LOW (ref 3.87–5.11)
RDW: 20.6 % — ABNORMAL HIGH (ref 11.5–15.5)
WBC: 16.2 10*3/uL — ABNORMAL HIGH (ref 4.0–10.5)
nRBC: 2.1 % — ABNORMAL HIGH (ref 0.0–0.2)

## 2020-07-22 LAB — AMMONIA: Ammonia: 39 umol/L — ABNORMAL HIGH (ref 9–35)

## 2020-07-22 LAB — GLUCOSE, CAPILLARY
Glucose-Capillary: 102 mg/dL — ABNORMAL HIGH (ref 70–99)
Glucose-Capillary: 105 mg/dL — ABNORMAL HIGH (ref 70–99)
Glucose-Capillary: 125 mg/dL — ABNORMAL HIGH (ref 70–99)
Glucose-Capillary: 134 mg/dL — ABNORMAL HIGH (ref 70–99)
Glucose-Capillary: 272 mg/dL — ABNORMAL HIGH (ref 70–99)
Glucose-Capillary: 50 mg/dL — ABNORMAL LOW (ref 70–99)
Glucose-Capillary: 61 mg/dL — ABNORMAL LOW (ref 70–99)
Glucose-Capillary: 86 mg/dL (ref 70–99)

## 2020-07-22 LAB — COMPREHENSIVE METABOLIC PANEL
ALT: 48 U/L — ABNORMAL HIGH (ref 0–44)
AST: 40 U/L (ref 15–41)
Albumin: 1.9 g/dL — ABNORMAL LOW (ref 3.5–5.0)
Alkaline Phosphatase: 268 U/L — ABNORMAL HIGH (ref 38–126)
Anion gap: 9 (ref 5–15)
BUN: 42 mg/dL — ABNORMAL HIGH (ref 6–20)
CO2: 24 mmol/L (ref 22–32)
Calcium: 8.6 mg/dL — ABNORMAL LOW (ref 8.9–10.3)
Chloride: 99 mmol/L (ref 98–111)
Creatinine, Ser: 1.1 mg/dL — ABNORMAL HIGH (ref 0.44–1.00)
GFR, Estimated: 56 mL/min — ABNORMAL LOW (ref >60)
Glucose, Bld: 131 mg/dL — ABNORMAL HIGH (ref 70–99)
Potassium: 5.4 mmol/L — ABNORMAL HIGH (ref 3.5–5.1)
Sodium: 132 mmol/L — ABNORMAL LOW (ref 135–145)
Total Bilirubin: 0.7 mg/dL (ref 0.3–1.2)
Total Protein: 6 g/dL — ABNORMAL LOW (ref 6.5–8.1)

## 2020-07-22 MED ORDER — DEXTROSE 50 % IV SOLN
INTRAVENOUS | Status: AC
  Administered 2020-07-22: 50 mL
  Filled 2020-07-22: qty 50

## 2020-07-22 MED ORDER — DEXTROSE 50 % IV SOLN
INTRAVENOUS | Status: AC
  Filled 2020-07-22: qty 50

## 2020-07-22 MED ORDER — LACTULOSE 10 GM/15ML PO SOLN
20.0000 g | Freq: Every day | ORAL | Status: DC
  Administered 2020-07-23 – 2020-07-25 (×3): 20 g
  Filled 2020-07-22 (×3): qty 30

## 2020-07-22 NOTE — Progress Notes (Signed)
  Speech Language Pathology Treatment: Dysphagia;Passy Muir Speaking valve  Patient Details Name: Carolyn Proctor MRN: [id number] DOB: 1961/01/21 Today's Date: 07/22/2020 Time: 5284-1324 SLP Time Calculation (min) (ACUTE ONLY): 31 min  Assessment / Plan / Recommendation Clinical Impression  Pt downsized to Shiley #4 cuffless trach from prior session with improved alertness level noted this tx session.  Focus of tx session was improving overall vocal quality/endurance via use of PMV with swallow tx completed in conjunction with PMV/speech tx.  Pt maintained SpO2 within range of 97-100 throughout trial, HR hovered around 100, with RR ranging from 22-34 with average being 25 during PMV use.  Pt experienced increased anxiety during trial which precipitated increased RR of 34, but this decreased when PMV was removed and pursed-lip exhalation was implemented with min verbal cues provided by SLP.  Vocal quality during simple conversation noted to be breathy with low volume/decreased breath support, although pt was able to phonate prolonged "ah" and counting with min verbal cues implemented to increase overall breath support for speech. Mildly-moderately decreased breath support for phonation noted and 75% intelligibility averaged throughout tx session.    Pt produced a strong cough and throat clearing during puree/thin liquid trial with min verbal cues provided by SLP. Min-mod verbal cues provided for institution of double swallows and chin tuck strategies during consumption of Dysphagia 1/thin liquid diet. Pt able to recall strategy of double swallow as session progressed, but required consistent mod cues for use of chin tuck for pharyngeal clearance throughout tx session.  Continue Dysphagia 1/thin liquid diet with swallowing precautions in place and full supervision with use of PMV during consumption.  Pt has TF for nutrition/hydration purposes as she is not able to maintain nutrition/hydration with PO intake  only at this time.  Pt would benefit from intermittent use of PMV throughout day with nursing staff utilizing full supervision to increase vocal quality and potential diet progression as pt is able.     HPI HPI:  Patient is a 59 year old female. She presented to the hospital with reports of stroke like, left facial droop and unresponsiveness. She has had a complicated hospital stay which included intubation from 9/15-10/4 with trach placed on 10/4.  PMH: lupus, a-fib, CHF, tachycardia, thrombocytosis, right ventricular dysfunction, rheumatoid lung disease, RA. CT hyead and MRI 9/15 and 9/18 were negative for acute changes. MRI brain 9/28: bilateral cerebral convexities measuring up to 3 mm in greatest thickness. Appears to predominantly correspond with diffuse pachymeningeal dural thickening and enhancement. However, small bilateral subdural effusions difficult to exclude.       SLP Plan  Continue with current plan of care       Recommendations  Diet recommendations: Dysphagia 1 (puree);Thin liquid Liquids provided via: Cup;Straw Medication Administration: Other (Comment) (crushed with puree and/or via alternative means) Supervision: Full supervision/cueing for compensatory strategies Compensations: Slow rate;Small sips/bites;Multiple dry swallows after each bite/sip;Chin tuck Postural Changes and/or Swallow Maneuvers: Chin tuck      Patient may use Passy-Muir Speech Valve: Caregiver trained to provide supervision PMSV Supervision: Full         General recommendations: Other(comment) (TBD) Oral Care Recommendations: Oral care BID Follow up Recommendations: LTACH SLP Visit Diagnosis: Dysphagia, oropharyngeal phase (R13.12);Aphonia (R49.1) Plan: Continue with current plan of care                       Carolyn Proctor, M.S., CCC-SLP 07/22/2020, 2:30 PM

## 2020-07-22 NOTE — Progress Notes (Signed)
[facility] PROGRESS NOTE  Carolyn Proctor HKV:[id number] DOB: September 03, 1961 DOA: 06/22/2020 PCP: Lilian Coma., MD  Status: Inpatient--Remains inpatient appropriate because:Altered mental status, Ongoing diagnostic testing needed not appropriate for outpatient work up, Unsafe d/c plan and Inpatient level of care appropriate due to severity of illness   Dispo: The patient is from: Home              Anticipated d/c is to: LTAC              Anticipated d/c date is: > 3 days              Patient currently is not medically stable to d/c. UNSAFE DC PLAN-patient has been liberated from mechanical ventilation as of 10/4 with subsequent placement of cuffed trach on same date.  Plans are to downsize and transition to cuffless trach on 10/14. Current neurological deficits consistent with sequelae of anoxic brain injury   Code Status: Full Family Communication: Patient as well as family member at bedside on 10/14 DVT prophylaxis: Eliquis Vaccination status: Has not been vaccinated against Covid  HPI: 59 year old with PMH paroxysmal atrial fibrillation, DVT/PE on Eliquis, history of heart failure with preserved EF, CKD stage III, rheumatoid arthritis, hypercoagulable state secondary to increased protein S and decreased protein C as well as decreased Antithrombin III, emphysema with pulmonary fibrosis, chronic venous insufficiency with chronic foot ulcers and mixed connective tissue disease.  Patient presented to the ER because she felt like she was having stroke symptoms.  While being treated by EMS she developed a left facial droop and became unresponsive.  Her ventilatory effort was poor therefore she required bag-valve-mask by EMS.  Additional evaluation revealed the patient be profoundly hypoglycemic as well as hypotensive.  On arrival to the ED she was intubated for airway protection.  Initial CT of the head was negative.  Despite administration of D50 she persisted with refractory hypoglycemia  therefore was placed on glucose infusion.  He was admitted by the critical care team.  Because of her history of chronic steroid use and hypotension she was initiated on stress dose steroids.  It was felt that her shock was cardiogenic in nature and she was started on diuresis.  Because of ongoing encephalopathy after admission extubation was delayed.  Tracheostomy was placed on 10/4 and she was eventually transitioned from ventilatory support to trach collar.  She also developed HCAP w/ sputum cultures positive for ESBL positive E. coli in the context of underlying interstitial lung disease and connective tissue disorder as evidenced by acute pneumonitis and pulmonary edema.  She was found to have acute systolic heart failure and heart failure team was consulted.Because of A. fib with RVR and NSVT she required an amiodarone drip.  Of note her initial urine cx as well as her sputum was positive for ESBL positive E. coli and it is likely sepsis physiology contributed to patient's presenting symptoms including hypoxemia with concurrent altered mentation and hypoglycemia resulting in further altered mentation and inability to protect airway.    She was transferred out of ICU to hospitalist service on 10/7 but had a T-max of 102 F on 10/8  therefore she was pancultured to determine source of infection.  Was empirically started on meropenem with plans to continue for total of 14 days.  All cultures have been negative.  Cardiology had been managing her CHF/ Lasix and on 10/11 transitioned her from IV to oral Lasix.  Given her debilitated state right heart cath not indicated and  performing would not change her current medical management at this point. Plan to pursue at a later date.  Patient has been awake and following commands.  She is working with SLP but not tolerating some swallow and PMV secondary size of current trach.  She is currently on tube feedings via a core track but it is hopeful that if trach can be  downsized quickly that she will pass swallowing evaluations and will not require a PEG tube.   Subjective: Patient alert and oriented. Nods mostly to questions. Denies pain.  Objective: Vitals:   07/22/20 0737 07/22/20 1112  BP: 122/67 118/70  Pulse: 93 98  Resp: 20 20  Temp: 98.3 F (36.8 C)   SpO2: 99% 99%    Intake/Output Summary (Last 24 hours) at 07/22/2020 1202 Last data filed at 07/22/2020 0830 Gross per 24 hour  Intake 1890 ml  Output 4050 ml  Net -2160 ml   Filed Weights   07/21/20 0000 07/21/20 0328 07/22/20 0500  Weight: 54 kg 54 kg 58.9 kg    Exam:  Constitutional: NAD, calm, comfortable  Neck: normal, supple, no masses, no thyromegaly; midline trach in place #6 Shiley cuffed-cuff deflated and PMV in place Respiratory: Posterior lungs sounds w/ rhonchi in bases. Normal respiratory effort. No accessory muscle use. #4.0 cuffless trach 28% FIO2 Cardiovascular: Regular rate and rhythm, no murmurs / rubs / gallops. No extremity edema. 2+ pedal pulses. No carotid bruits.  Abdomen: no tenderness, no masses palpated. No hepatosplenomegaly. Bowel sounds positive.Cortrak tube in place for feedings and meds.  Has rectal tube in place with large volume dark brown liquid stool in drainage bag. Musculoskeletal: no clubbing / cyanosis. No joint deformity upper and lower extremities. Good ROM, no contractures. Normal muscle tone.  Skin: Unremarkable except for areas of blackened skin at the distal tips of fingers of hands and on the toes which patient reports are painful to touch.  Has Prevalon boots in place so unable to adequately assess skin of feet, ankles or tibia region. Neurologic: CN 2-12 grossly intact. Sensation intact, DTR not assessed strength 4/5 right upper extremity, bilateral lower extremities are extremely weak with patient unable to lift legs off the bed so strength is about 1-2/5.  Shoulder strength is 3/5 laterally.  Left upper extremity extremely weak especially  distally with strength ~1/5. Psychiatric: Normal judgment and insight. Alert and oriented x 3 although did miss year stating 1921 but was able to regroup and state 2021.Marland Kitchen Normal mood.    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 -Has remained stable on trach collar with a #6 cuffed Shiley trach-trach team consulted and patient will be changed out to a #4 cuffless trach on 10/14 -Completed meropenem but in context of recurrent fevers this was resumed on 07/14/2020 recommendation from PCCM was to continue for 14 days total -On chronic prednisone at home 5 mg daily.  Was started on prednisone 40 mg daily for acute pneumonitis in setting of ILD on 07/05/2020.  Dose decreased to 30 mg daily on 10/14 with recommendation to decrease by 10 mg weekly until at home dose. -Dr. Lake Bells discussed with her outpatient rheumatologist and they feel that this is a vasculitis picture and given her underlying infections and they cannot use more aggressive immunosuppression other than prednisone at this time -Given trach placed on 10/4  would not be a candidate for SNF until 11/4.  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 Ephriam Jenkins with [facility]). - previously treated with rituximab >> last in November 2020 - defer further immunosuppressive therapy while she is being treated for infection -She will need close rheumatology follow-up after discharge.   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 Atrial flutter/atrial tachycardia with runs of NSVT - new this admission. - advanced  heart failure team following intermittently; initially she was in cardiogenic shock due to right ventricular failure/cor pulmonale -echo repeated -improved with milrinone and diuresis-only on oral Lasix as of 10/11 -Had additional shock physiology secondary to sepsis from ESBL E. coli pneumonia antibiotics subsequently changed from cefepime to [facility] physiology has resolved and patient remains stable - continue amiodarone 200 mg daily for suppression of RVR and NSVT; EF 35 to 40%-currently not on beta-blockers or ARB/ACE I -(-) 5600 cc since admission -continue strict I's/O -Cardiology documented that a right heart cath would not change management at this point and they feel that she needs to be more stable therefore can pursue this later. -Continue Eliquis for stroke prophylaxis  Acute metabolic encephalopathy from hypoglycemia, hypotension, hypoxia. Suspected anoxic brain injury Seizure in setting of hypoxia with respiratory failure. - currently alert and oriented -Noted patient with left upper extremity weakness which apparently was not present prior to admission therefore suspect some degree of anoxic brain injury -continue Keppra-check levetiracetam level in am 10/15 -Delirium precautions -Brain MRI with no evidence of vasculitis   Severe physical deconditioning -Secondary to above -Hopeful for LTAC placement although if continues to improve may be eligible to be evaluated by inpatient rehab -Continue PT and OT -Continue Prevalon boots  Pulmonary Embolism in June 2021. -Continue Eliquis  Bilateral Pressure Ulcers - Wound Consult - BID Dressing Change w/ silvadene Wound / Incision (Open or Dehisced) 07/13/20 (MASD) Moisture Associated Skin Damage Buttocks Right;Left Skin excoriated on buttocks (Active)  Date First Assessed/Time First Assessed: 07/13/20 1200   Wound Type: (MASD) Moisture Associated Skin Damage  Location: Buttocks  Location Orientation: Right;Left  Wound  Description (Comments): Skin excoriated on buttocks  Present on Admission: No    Assessments 07/13/2020  2:00 PM 07/20/2020  9:56 PM  Dressing Type Foam - Lift dressing to assess site every shift Foam - Lift dressing to assess site every shift  Dressing Changed New Changed  Dressing Status Clean;Dry;Intact Clean;Dry;Intact  Dressing Change Frequency PRN PRN  Site / Wound Assessment Red Pink  Peri-wound Assessment Excoriated Intact  Margins Attached edges (approximated) --  Closure None --  Drainage Amount Scant None  Drainage Description No odor;Serous --  Non-staged Wound Description Not applicable --  Treatment Cleansed Cleansed;Irrigation     No Linked orders to display     Wound / Incision (Open or Dehisced) 07/14/20 (MASD) Moisture Associated Skin Damage Thigh Anterior;Right;Left (Active)  Date First Assessed/Time First Assessed: 07/14/20 2207   Wound Type: (MASD) Moisture Associated Skin Damage  Location: Thigh  Location Orientation: Anterior;Right;Left  Present on Admission: No    Assessments 07/14/2020 10:07 PM 07/20/2020  9:56 PM  Dressing Type None None  Site / Wound Assessment Pink --  Closure None --  Drainage Amount Minimal --  Drainage Description Serous --  Treatment Cleansed Cleansed  No Linked orders to display     Wound / Incision (Open or Dehisced) 07/19/20 Non-pressure wound Pretibial Distal;Left (Active)  Date First Assessed/Time First Assessed: 07/19/20 1100   Wound Type: Non-pressure wound  Location: Pretibial  Location Orientation: Distal;Left  Present on Admission: (c)     Assessments 07/20/2020 11:34 AM 07/20/2020  9:56 PM  Dressing Type -- Non adherent;Gauze (Comment)  Dressing Changed -- (S) New  Dressing Status -- Clean;Dry;Intact  Dressing Change Frequency -- Daily  Site / Wound Assessment -- Pink;Granulation tissue  Peri-wound Assessment -- Intact  Wound Length (cm) 3 cm --  Wound Width (cm) 3 cm --  Wound Surface Area (cm^2) 9 cm^2 --   Margins -- Unattached edges (unapproximated)  Drainage Amount -- None     No Linked orders to display     Wound / Incision (Open or Dehisced) 07/19/20 Non-pressure wound Pretibial Distal;Right (Active)  Date First Assessed/Time First Assessed: 07/19/20 1100   Wound Type: Non-pressure wound  Location: Pretibial  Location Orientation: Distal;Right  Present on Admission: (c)     Assessments 07/20/2020 11:33 AM 07/20/2020  9:56 PM  Dressing Type Non adherent;Gauze (Comment) Non adherent;Gauze (Comment)  Dressing Changed Changed (S) Changed  Dressing Status Clean;Dry;Intact Clean;Dry;Intact  Dressing Change Frequency Daily Daily  Site / Wound Assessment Friable;Pink;Yellow --  Peri-wound Assessment Intact Intact  Margins Unattached edges (unapproximated) Unattached edges (unapproximated)  Closure None --  Drainage Amount Minimal None  Drainage Description Purulent;No odor --  Treatment -- Cleansed     No Linked orders to display    Hypernatremia (Resolved)/hyponatremia/hyperkalemia -Continue to follow labs periodically -K remains elevated- cont Lasix and follow labs daily  ?Liver cirrhosis/Abnormal LFTs, -LFTs normal now. -Continues to have significant diarrhea/watery stools therefore will check ammonia level to determine if lactulose dose needs to be adjusted or discontinued -Patient was not on Chronulac or lactulose prior to admission-given persistent electrolyte abnormalities and watery stools will decrease chronulac from 30g daily to 20 g-plan ck ammonia level on Monday-if stable and loose stools persist will further taper chronulac -CT abdomen and pelvis 1 year ago without any evidence of cirrhosis -CTA that included abdomen during this admission revealed liver contour somewhat nodular and there may be a component of underlying cirrhosis. -Suspect this is more reflective of passive hepatic congestion from patient's severe heart failure and presentation with acute systolic heart  failure than of true cirrhosis  Steroid Induced Hyperglycemia. -Resolved -experiencing hypoglycemia w/ scheduled Novolog (using for tube feeding coverage) therefore will dc and focus on SSI only  Macrocytic Anemia -Stable.  Monitor. -Iron normal at 52 with an elevated ferritin on 10/11  Chest Pain, rule out ACS;chest pain has completely resolved -Chest pain a few days ago which is now completely resolved -EKG was relatively unremarkable and nonischemic; -Troponin was initially elevated likely demand ischemia in the setting of her nausea vomiting and her current comorbidities  Thrombocytosis -Reactive and stable.  She is already on anticoagulant.  Dysphagia/severe Protein Calorie Malnutrition. -On dysphagia 1 diet but not eating much so supplemented by tube feedings. -Hopefully with downsizing of trach patient will be able to tolerate swallowing better and can eventually have core track tube discontinued and avoid PEG tube placement -cont SLP    Data Reviewed: Basic Metabolic Panel: Recent Labs  Lab 07/16/20 0506 07/16/20 0506 07/17/20 0500 07/18/20 0536 07/19/20 1117 07/20/20 0408 07/22/20 0500  NA 136   < > 128* 133* 133* 132* 132*  K 5.4*   < > 5.1  5.4* 5.0 5.2* 5.4*  CL 105   < > 96* 100 100 99 99  CO2 24   < > '23 26 25 26 24  ' GLUCOSE 162*   < > 257* 115* 99 91 131*  BUN 39*   < > 39* 41* 41* 41* 42*  CREATININE 1.08*   < > 1.10* 1.00 1.04* 1.16* 1.10*  CALCIUM 8.2*   < > 7.7* 8.0* 7.7* 8.2* 8.6*  MG 1.9  --  2.0 2.0 1.9 2.0  --   PHOS 3.4  --  3.1 3.7 3.1 3.1  --    < > = values in this interval not displayed.   Liver Function Tests: Recent Labs  Lab 07/17/20 0500 07/18/20 0536 07/19/20 1117 07/20/20 0408 07/22/20 0500  AST 36 30 33 30 40  ALT 53* 39 37 35 48*  ALKPHOS 205* 211* 213* 187* 268*  BILITOT 0.8 0.7 0.7 0.9 0.7  PROT 5.0* 5.4* 5.4* 5.5* 6.0*  ALBUMIN 1.4* 1.4* 1.6* 1.6* 1.9*   No results for input(s): LIPASE, AMYLASE in the last 168  hours. Recent Labs  Lab 07/22/20 0500  AMMONIA 39*   CBC: Recent Labs  Lab 07/16/20 0506 07/16/20 0506 07/17/20 0500 07/17/20 0500 07/18/20 0536 07/19/20 0358 07/20/20 0408 07/21/20 0440 07/22/20 0500  WBC 15.4*   < > 14.8*   < > 16.9* 15.7* 17.5* 17.2* 16.2*  NEUTROABS 13.3*  --  11.9*  --  13.4*  --  12.6*  --   --   HGB 10.8*   < > 9.8*   < > 10.8* 10.1* 10.1* 10.9* 11.6*  HCT 35.1*   < > 32.3*   < > 34.3* 33.6* 33.0* 34.9* 37.9  MCV 106.0*   < > 106.3*   < > 106.5* 105.3* 106.1* 107.1* 106.8*  PLT 373   < > 425*   < > 485* 512* 531* 591* 643*   < > = values in this interval not displayed.   Cardiac Enzymes: No results for input(s): CKTOTAL, CKMB, CKMBINDEX, TROPONINI in the last 168 hours. BNP (last 3 results) No results for input(s): BNP in the last 8760 hours.  ProBNP (last 3 results) No results for input(s): PROBNP in the last 8760 hours.  CBG: Recent Labs  Lab 07/22/20 0334 07/22/20 0425 07/22/20 0908 07/22/20 1032 07/22/20 1123  GLUCAP 61* 86 50* 102* 134*    Recent Results (from the past 240 hour(s))  Culture, Urine     Status: None   Collection Time: 07/14/20  1:15 PM   Specimen: Urine, Random  Result Value Ref Range Status   Specimen Description URINE, RANDOM  Final   Special Requests NONE  Final   Culture   Final    NO GROWTH Performed at [facility], [place] [address], [place], [place] [address]    Report Status 07/15/2020 FINAL  Final  Culture, blood (routine x 2)     Status: None   Collection Time: 07/14/20  2:06 PM   Specimen: BLOOD LEFT ARM  Result Value Ref Range Status   Specimen Description BLOOD LEFT ARM  Final   Special Requests   Final    BOTTLES DRAWN AEROBIC ONLY Blood Culture adequate volume   Culture   Final    NO GROWTH 5 DAYS Performed at [facility], [place] [address] [place], [place] [address]    Report Status 07/19/2020 FINAL  Final  Culture, blood (routine x 2)     Status: None  Collection Time: 07/14/20   2:10 PM   Specimen: BLOOD LEFT HAND  Result Value Ref Range Status   Specimen Description BLOOD LEFT HAND  Final   Special Requests   Final    BOTTLES DRAWN AEROBIC ONLY Blood Culture adequate volume   Culture   Final    NO GROWTH 5 DAYS Performed at [facility], [address], [place], [place] [address]    Report Status 07/19/2020 FINAL  Final     Studies: No results found.  Scheduled Meds: . amiodarone  200 mg Per Tube Daily  . apixaban  5 mg Per Tube BID  . chlorhexidine gluconate (MEDLINE KIT)  15 mL Mouth Rinse BID  . Chlorhexidine Gluconate Cloth  6 each Topical Q0600  . feeding supplement (PROSource TF)  45 mL Per Tube Daily  . furosemide  20 mg Per Tube Daily  . influenza vac split quadrivalent PF  0.5 mL Intramuscular Tomorrow-1000  . insulin aspart  0-9 Units Subcutaneous Q4H  . [START ON 07/23/2020] lactulose  20 g Per Tube Daily  . levETIRAcetam  500 mg Per Tube BID  . mouth rinse  15 mL Mouth Rinse 10 times per day  . pantoprazole sodium  40 mg Per Tube Daily  . predniSONE  30 mg Per NG tube Q breakfast  . silver sulfADIAZINE   Topical BID  . sodium chloride flush  10-40 mL Intracatheter Q12H   Continuous Infusions: . feeding supplement (OSMOLITE 1.5 CAL) 1,000 mL (07/21/20 1144)    Active Problems:   Pulmonary HTN (HCC)   Acute encephalopathy   Acute respiratory failure (HCC)   Oxygen desaturation   Cardiogenic shock (HCC)   Acute renal failure with tubular necrosis (HCC)   RVF (right ventricular failure) (HCC)   E. coli UTI   Protein-calorie malnutrition, severe   Acute respiratory distress syndrome (ARDS) due to COVID-19 virus ([place])   Anoxic brain injury ([place])   Tracheostomy status ([place])   Septic shock [facility])   Consultants:  PCCM Transfer  Cardiology  Neurology  Procedures: 9/15 ETT >> 9/15 R IJ TL CVC >>9/27 9/27 PICC > 10/4 Trach 10/14 downsized to 4.0 cuffless trach  Antibiotics: Anti-infectives (From admission, onward)    Start     Dose/Rate Route Frequency Ordered Stop   07/14/20 1400  meropenem (MERREM) 2 g in sodium chloride 0.9 % 100 mL IVPB  Status:  Discontinued        2 g 200 mL/hr over 30 Minutes Intravenous Every 8 hours 07/14/20 1339 07/22/20 1051   07/06/20 1400  meropenem (MERREM) 2 g in sodium chloride 0.9 % 100 mL IVPB        2 g 200 mL/hr over 30 Minutes Intravenous Every 8 hours 07/06/20 1124 07/12/20 2230   07/05/20 1000  vancomycin (VANCOREADY) IVPB 500 mg/100 mL  Status:  Discontinued        500 mg 100 mL/hr over 60 Minutes Intravenous Every 12 hours 07/04/20 2127 07/05/20 0902   07/04/20 2200  ceFEPIme (MAXIPIME) 2 g in sodium chloride 0.9 % 100 mL IVPB  Status:  Discontinued        2 g 200 mL/hr over 30 Minutes Intravenous Every 12 hours 07/04/20 0929 07/06/20 1124   07/04/20 2200  vancomycin (VANCOREADY) IVPB 500 mg/100 mL  Status:  Discontinued        500 mg 100 mL/hr over 60 Minutes Intravenous Every 12 hours 07/04/20 1149 07/04/20 2127   07/04/20 2200  vancomycin (VANCOCIN)  IVPB 1000 mg/200 mL premix        1,000 mg 200 mL/hr over 60 Minutes Intravenous  Once 07/04/20 2127 07/05/20 0032   07/04/20 0930  vancomycin (VANCOCIN) IVPB 1000 mg/200 mL premix  Status:  Discontinued        1,000 mg 200 mL/hr over 60 Minutes Intravenous  Once 07/04/20 0929 07/04/20 2127   07/04/20 0930  vancomycin (VANCOREADY) IVPB 500 mg/100 mL  Status:  Discontinued        500 mg 100 mL/hr over 60 Minutes Intravenous Every 12 hours 07/04/20 0929 07/04/20 1149   06/23/20 1015  vancomycin (VANCOREADY) IVPB 750 mg/150 mL  Status:  Discontinued        750 mg 150 mL/hr over 60 Minutes Intravenous Every 12 hours 06/23/20 1007 06/24/20 0849   06/23/20 1000  piperacillin-tazobactam (ZOSYN) IVPB 3.375 g        3.375 g 12.5 mL/hr over 240 Minutes Intravenous Every 8 hours 06/23/20 0948 06/28/20 0805   06/22/20 1230  vancomycin (VANCOREADY) IVPB 1250 mg/250 mL        1,250 mg 166.7 mL/hr over 90 Minutes  Intravenous  Once 06/22/20 1226 06/22/20 1535   06/22/20 1230  piperacillin-tazobactam (ZOSYN) IVPB 3.375 g        3.375 g 100 mL/hr over 30 Minutes Intravenous  Once 06/22/20 1226 06/22/20 1502       Time spent: [place]  [facility] Pager [phone]. If 7PM-7AM, please contact night-coverage at [email or web address] 07/22/2020, 12:02 PM  LOS: 30 days

## 2020-07-22 NOTE — Progress Notes (Signed)
NAME:  Carolyn Proctor, MRN:  [id number], DOB:  09/15/1961, LOS: 59 ADMISSION DATE:  06/22/2020, CONSULTATION DATE:  9/15 REFERRING MD:  Tyrone Nine, CHIEF COMPLAINT:  Confusion, refractory hypoglycemia   Brief History   66 yoF at home who complained to family she felt like she was having a stroke, then developed left facial droop and went unresponsive.  Required BVM by EMS and noted to be hypoglycemic and hypotensive.  Intubated in ER for airway protection.  CTH negative.  Refractory hypoglycemia despite multiple D50 amps and required a drip.  PCCM called to admit.   [facility] hospitalist became primary for patient 10/7 and at that time PCCM began seeing patient once weekly for trach care.  We were called back 10/8 due to change in clinical picture.  Per RT report patient had episode of significant emesis after tracheal suction morning of 10/8.  This resulted in short-term tachypnea and increased supplemental oxygen demand.  On bedside assessment 10/8 patient is comfortably lying in bed on 20% aerosolized trach collar in no acute respiratory distress.  Past Medical History  PAF, DVT, PE on Eliquis HFpEF CKD stage III RA Hypercoagulable state (increased protein S antigen, decreased protein C activity, and decreased Antithrombin III) Centrilobular emphysema and pulmonary fibrosis- non smoker Chronic venous insufficiency w/ chronic foot ulcers  Diverticulitis   Significant Hospital Events   9/15 admitted/ intubated >> pulse dose steroids, diuresis, cardiogenic shock support 9/25 on full vent support, encephalopathy precluding extubation 9/26 not weaning, encephalopathic, dysautonomia, off milrinonoe 10/4 Trach    Consults:  Neurology Advanced heart failure  Procedures:  9/15 ETT >> 9/15 R IJ TL CVC >>9/27 9/27 PICC >  10/4 Trach  Significant Diagnostic Tests:   9/15 CTH >> negative  9/15 CTA chest/ abd/ pelvis >> significant dilatation of RA and RV consistent with R heart failure;  moderate R and small L pleural effusions with associated atelectasis of both lower lobes; underlying chronic cystic and fibrotic lung disease; severe anasarca; nodular contour of liver suggestive of possible cirrhosis  9/15 Echo> LVEF 35-40%, mod decreased LV function, RVSP 75, severely reduced RV systolic function; R to left shunt noted, suggestive of PFO, RA severely dilated, mod pericardial effusion  9/15 CTA head and neck >> no significant carotid or vertebral artery stenosis in neck, no LVO, right jugular central venous catheter with probable surrounding hematoma   9/18 MR BRAIN>> no acute intracranial abnormality  9/28 MRI brain > diffuse pachymeningeal enhancement, bilateral cerebral convexities, sub centimeter extra axial focus of T1 hyperintensity along R temporal occiptal lobe, new  Micro Data:  9/15 BCx2 central line >> NG5D 9/15 BCx2 PIV >> Strept mitis 9/15 SARS2 >> negative 9/15 UC >> ESBL E coli 9/16 Trach asp cult >> Normal flora 9/27 blood > negative 9/27 resp > e coli ESBL  Antimicrobials:  9/15 vanc >> 9/17 9/15 zosyn >> 9/20  9/27 vanc > 9/28 9/27 cefepime > 9/29 9/29 meropenem > 10/5  Interim history/subjective:  She denies respiratory complaints. No rescue suctioning required per RN.   Objective   Blood pressure 122/67, pulse 93, temperature 98.3 F (36.8 C), temperature source Oral, resp. rate 20, height 5\' 11"  (1.803 m), weight 58.9 kg, last menstrual period 02/19/2013, SpO2 99 %.    FiO2 (%):  [28 %] 28 %   Intake/Output Summary (Last 24 hours) at 07/22/2020 1010 Last data filed at 07/22/2020 0830 Gross per 24 hour  Intake 1890 ml  Output 4050 ml  Net -2160 ml  Filed Weights   07/21/20 0000 07/21/20 0328 07/22/20 0500  Weight: 54 kg 54 kg 58.9 kg    Examination: General : frail appearing middle-aged woman sitting up in bed watching TV HENT: [place]/AT, eyes anicteric Neck: Trach in place, not wearing Passy-Muir valve.  Very quiet voice. Pulm: No  rhonchi or wheezing.  Clear to auscultation anteriorly.  No tachypnea or increased work of breathing. Cardio: Regular rate and rhythm Abd: Soft, minimally tender to palpation diffusely, nondistended, no guarding Ext: Dependent pitting edema, cachexia. Neuro: Fatigued appearing, tracks and attempts to talk to answer questions.  Globally very weak  Resolved Hospital Problem list   ESBL E. 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 Ephriam Jenkins with [facility]). Dysphagia  DM  Physical deconditioning Cirrhosis Macrocytic anemia Thrombocytosis Pressure ulcers PE (June 2021) on DOAC Acute metabolic encephalopathy    pulm problem list  Tracheostomy dependence s/p prolonged critical illness ESBL producing E-coli HAP Acute pneumonitis Dysphagia   Discussion  Doing well w/ dysphagia diet. Breathy vocalization w/ PMV. No need for sxn. Handling secretions. Downsized to shiley #4 cuffless on 10/14  Plan/rec -Doing well with #4 tracheostomy.  Continue ongoing use of Passy-Muir valve as much as possible.  PMV should be off overnight.  Working towards capping trials eventually. -Complete 14d meropenem (today is day 16 since 9/29).  Likely okay to discontinue and monitor.  Would reculture if recurrent fevers. -Continue routine post trach care.  We will follow up with her next week.  Please call over the weekend with questions.  Best practice:  Per primary     Julian Hy, DO 07/22/20 10:22 AM [facility]

## 2020-07-23 DIAGNOSIS — J80 Acute respiratory distress syndrome: Secondary | ICD-10-CM | POA: Diagnosis not present

## 2020-07-23 DIAGNOSIS — U071 COVID-19: Secondary | ICD-10-CM | POA: Diagnosis not present

## 2020-07-23 DIAGNOSIS — G9340 Encephalopathy, unspecified: Secondary | ICD-10-CM | POA: Diagnosis not present

## 2020-07-23 LAB — GLUCOSE, CAPILLARY
Glucose-Capillary: 83 mg/dL (ref 70–99)
Glucose-Capillary: 90 mg/dL (ref 70–99)
Glucose-Capillary: 194 mg/dL — ABNORMAL HIGH (ref 70–99)
Glucose-Capillary: 252 mg/dL — ABNORMAL HIGH (ref 70–99)
Glucose-Capillary: 127 mg/dL — ABNORMAL HIGH (ref 70–99)

## 2020-07-23 LAB — BASIC METABOLIC PANEL
Anion gap: 9 (ref 5–15)
BUN: 45 mg/dL — ABNORMAL HIGH (ref 6–20)
CO2: 25 mmol/L (ref 22–32)
Calcium: 8.7 mg/dL — ABNORMAL LOW (ref 8.9–10.3)
Chloride: 99 mmol/L (ref 98–111)
Creatinine, Ser: 1.16 mg/dL — ABNORMAL HIGH (ref 0.44–1.00)
GFR, Estimated: 52 mL/min — ABNORMAL LOW (ref >60)
Glucose, Bld: 145 mg/dL — ABNORMAL HIGH (ref 70–99)
Potassium: 5.5 mmol/L — ABNORMAL HIGH (ref 3.5–5.1)
Sodium: 133 mmol/L — ABNORMAL LOW (ref 135–145)

## 2020-07-23 NOTE — Progress Notes (Signed)
[facility] PROGRESS NOTE  Carolyn Proctor ZCH:[id number] DOB: Nov 03, 1960 DOA: 06/22/2020 PCP: Lilian Coma., MD  Status: Inpatient--Remains inpatient appropriate because:Altered mental status, Ongoing diagnostic testing needed not appropriate for outpatient work up, Unsafe d/c plan and Inpatient level of care appropriate due to severity of illness   Dispo: The patient is from: Home              Anticipated d/c is to: LTAC              Anticipated d/c date is: > 3 days              Patient currently is not medically stable to d/c. UNSAFE DC PLAN-patient has been liberated from mechanical ventilation as of 10/4 with subsequent placement of cuffed trach on same date.  Plans are to downsize and transition to cuffless trach on 10/14. Current neurological deficits consistent with sequelae of anoxic brain injury   Code Status: Full Family Communication: None present. DVT prophylaxis: Eliquis Vaccination status: Has not been vaccinated against Covid  HPI: 59 year old with PMH paroxysmal atrial fibrillation, DVT/PE on Eliquis, history of heart failure with preserved EF, CKD stage III, rheumatoid arthritis, hypercoagulable state secondary to increased protein S and decreased protein C as well as decreased Antithrombin III, emphysema with pulmonary fibrosis, chronic venous insufficiency with chronic foot ulcers and mixed connective tissue disease.  Patient presented to the ER because she felt like she was having stroke symptoms.  While being treated by EMS she developed a left facial droop and became unresponsive.  Her ventilatory effort was poor therefore she required bag-valve-mask by EMS.  Additional evaluation revealed the patient be profoundly hypoglycemic as well as hypotensive.  On arrival to the ED she was intubated for airway protection.  Initial CT of the head was negative.  Despite administration of D50 she persisted with refractory hypoglycemia therefore was placed on glucose infusion.   He was admitted by the critical care team.  Because of her history of chronic steroid use and hypotension she was initiated on stress dose steroids.  It was felt that her shock was cardiogenic in nature and she was started on diuresis.  Because of ongoing encephalopathy after admission extubation was delayed.  Tracheostomy was placed on 10/4 and she was eventually transitioned from ventilatory support to trach collar.  She also developed HCAP w/ sputum cultures positive for ESBL positive E. coli in the context of underlying interstitial lung disease and connective tissue disorder as evidenced by acute pneumonitis and pulmonary edema.  She was found to have acute systolic heart failure and heart failure team was consulted.Because of A. fib with RVR and NSVT she required an amiodarone drip.  Of note her initial urine cx as well as her sputum was positive for ESBL positive E. coli and it is likely sepsis physiology contributed to patient's presenting symptoms including hypoxemia with concurrent altered mentation and hypoglycemia resulting in further altered mentation and inability to protect airway.    She was transferred out of ICU to hospitalist service on 10/7 but had a T-max of 102 F on 10/8  therefore she was pancultured to determine source of infection.  Was empirically started on meropenem with plans to continue for total of 14 days.  All cultures have been negative.  Cardiology had been managing her CHF/ Lasix and on 10/11 transitioned her from IV to oral Lasix.  Given her debilitated state right heart cath not indicated and performing would not change her current medical management  at this point. Plan to pursue at a later date.  Patient has been awake and following commands.  She is working with SLP but not tolerating some swallow and PMV secondary size of current trach.  She is currently on tube feedings via a core track but it is hopeful that if trach can be downsized quickly that she will pass swallowing  evaluations and will not require a PEG tube.   Subjective: Seen and examined.  Remains alert and communicates with nodding her head.  Denies any complaints.  Objective: Vitals:   07/23/20 0817 07/23/20 1200  BP:  122/73  Pulse:    Resp: (!) 24   Temp:  97.9 F (36.6 C)  SpO2: 98% 98%    Intake/Output Summary (Last 24 hours) at 07/23/2020 1355 Last data filed at 07/23/2020 0814 Gross per 24 hour  Intake 120 ml  Output 1050 ml  Net -930 ml   Filed Weights   07/21/20 0000 07/21/20 0328 07/22/20 0500  Weight: 54 kg 54 kg 58.9 kg    Exam:  General exam: Appears calm and comfortable with cuffless trach, cortrak tube in place. Respiratory system: Clear to auscultation. Respiratory effort normal. Cardiovascular system: S1 & S2 heard, RRR. No JVD, murmurs, rubs, gallops or clicks. No pedal edema. Gastrointestinal system: Abdomen is nondistended, soft and nontender. No organomegaly or masses felt. Normal bowel sounds heard. Central nervous system: Alert but unable to assess orientation.  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 -Has remained stable on trach collar with a #6 cuffed [place] trach-trach team consulted and patient will be changed out to a #4 cuffless trach on 10/14 -Completed meropenem but in context of recurrent fevers this was resumed on 07/14/2020 recommendation from PCCM was to continue for 14 days total -On chronic prednisone at home 5 mg daily.  Was started on prednisone 40 mg daily for acute pneumonitis in setting of ILD on 07/05/2020.  Dose decreased to 30 mg daily on 10/14 with recommendation to decrease by 10 mg weekly until at home dose. -Dr. Lake Bells discussed with her outpatient rheumatologist and they feel  that this is a vasculitis picture and given her underlying infections and they cannot use more aggressive immunosuppression other than prednisone at this time -Given trach placed on 10/4 would not be a candidate for SNF until 11/4.  If improves at [facility] could benefit from CIR if eligible -10/15 new rhonchi on exam so x-ray was checked which did not show any acute pathology.  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 Ephriam Jenkins with [facility]). - previously treated with rituximab >> last in November 2020 - defer further immunosuppressive therapy while she is being treated for infection -She will need close rheumatology follow-up after discharge.   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 Atrial flutter/atrial tachycardia with runs of NSVT - new this admission. - advanced heart failure team following intermittently; initially she was in cardiogenic shock due to right ventricular failure/cor pulmonale -echo repeated -improved with milrinone and diuresis-only on oral Lasix as of 10/11 -Had additional shock physiology secondary to sepsis from ESBL E. coli pneumonia antibiotics subsequently changed from cefepime to [facility] physiology has  resolved and patient remains stable - continue amiodarone 200 mg daily for suppression of RVR and NSVT; EF 35 to 40%-currently not on beta-blockers or ARB/ACE I -(-) 5600 cc since admission -continue strict I's/O -Cardiology documented that a right heart cath would not change management at this point and they feel that she needs to be more stable therefore can pursue this later. -Continue Eliquis for stroke prophylaxis  Acute metabolic encephalopathy from hypoglycemia, hypotension, hypoxia. Suspected anoxic brain injury Seizure in setting of hypoxia with respiratory failure. - currently alert and oriented -Noted  patient with left upper extremity weakness which apparently was not present prior to admission therefore suspect some degree of anoxic brain injury -continue Keppra-check levetiracetam level in am 10/15 -Delirium precautions -Brain MRI with no evidence of vasculitis   Severe physical deconditioning -Secondary to above -Hopeful for LTAC placement although if continues to improve may be eligible to be evaluated by inpatient rehab -Continue PT and OT -Continue Prevalon boots  Pulmonary Embolism in June 2021. -Continue Eliquis  Bilateral Pressure Ulcers - Wound Consult - BID Dressing Change w/ silvadene Wound / Incision (Open or Dehisced) 07/13/20 (MASD) Moisture Associated Skin Damage Buttocks Right;Left Skin excoriated on buttocks (Active)  Date First Assessed/Time First Assessed: 07/13/20 1200   Wound Type: (MASD) Moisture Associated Skin Damage  Location: Buttocks  Location Orientation: Right;Left  Wound Description (Comments): Skin excoriated on buttocks  Present on Admission: No    Assessments 07/13/2020  2:00 PM 07/20/2020  9:56 PM  Dressing Type Foam - Lift dressing to assess site every shift Foam - Lift dressing to assess site every shift  Dressing Changed New Changed  Dressing Status Clean;Dry;Intact Clean;Dry;Intact  Dressing Change Frequency PRN PRN  Site / Wound Assessment Red Pink  Peri-wound Assessment Excoriated Intact  Margins Attached edges (approximated) --  Closure None --  Drainage Amount Scant None  Drainage Description No odor;Serous --  Non-staged Wound Description Not applicable --  Treatment Cleansed Cleansed;Irrigation     No Linked orders to display     Wound / Incision (Open or Dehisced) 07/14/20 (MASD) Moisture Associated Skin Damage Thigh Anterior;Right;Left (Active)  Date First Assessed/Time First Assessed: 07/14/20 2207   Wound Type: (MASD) Moisture Associated Skin Damage  Location: Thigh  Location Orientation: Anterior;Right;Left  Present on  Admission: No    Assessments 07/14/2020 10:07 PM 07/20/2020  9:56 PM  Dressing Type None None  Site / Wound Assessment Pink --  Closure None --  Drainage Amount Minimal --  Drainage Description Serous --  Treatment Cleansed Cleansed     No Linked orders to display     Wound / Incision (Open or Dehisced) 07/19/20 Non-pressure wound Pretibial Distal;Left (Active)  Date First Assessed/Time First Assessed: 07/19/20 1100   Wound Type: Non-pressure wound  Location: Pretibial  Location Orientation: Distal;Left  Present on Admission: (c)     Assessments 07/20/2020 11:34 AM 07/23/2020  5:00 AM  Dressing Type -- Moist to dry;Gauze (Comment)  Dressing Changed -- Changed  Dressing Status -- Clean;Dry;Intact  Wound Length (cm) 3 cm --  Wound Width (cm) 3 cm --  Wound Surface Area (cm^2) 9 cm^2 --     No Linked orders to display     Wound / Incision (Open or Dehisced) 07/19/20 Non-pressure wound Pretibial Distal;Right (Active)  Date First Assessed/Time First Assessed: 07/19/20 1100   Wound Type: Non-pressure wound  Location: Pretibial  Location Orientation: Distal;Right  Present on Admission: (c)     Assessments 07/20/2020 11:33 AM 07/23/2020  5:00 AM  Dressing Type Non adherent;Gauze (Comment) Moist to dry;Gauze (Comment)  Dressing Changed Changed Changed  Dressing Status Clean;Dry;Intact Clean;Dry;Intact  Dressing Change Frequency Daily --  Site / Wound Assessment Friable;Pink;Yellow --  Peri-wound Assessment Intact --  Margins Unattached edges (unapproximated) --  Closure None --  Drainage Amount Minimal --  Drainage Description Purulent;No odor --     No Linked orders to display    Hypernatremia (Resolved)/hyponatremia/hyperkalemia -Continue to follow labs periodically -K remains elevated- cont Lasix and follow labs daily  ?Liver cirrhosis/Abnormal LFTs, -LFTs normal now. -Continues to have significant diarrhea/watery stools therefore will check ammonia level to determine if  lactulose dose needs to be adjusted or discontinued -Patient was not on Chronulac or lactulose prior to admission-given persistent electrolyte abnormalities and watery stools will decrease chronulac from 30g daily to 20 g-plan ck ammonia level on Monday-if stable and loose stools persist will further taper chronulac -CT abdomen and pelvis 1 year ago without any evidence of cirrhosis -CTA that included abdomen during this admission revealed liver contour somewhat nodular and there may be a component of underlying cirrhosis. -Suspect this is more reflective of passive hepatic congestion from patient's severe heart failure and presentation with acute systolic heart failure than of true cirrhosis  Steroid Induced Hyperglycemia. -Resolved and in fact had hypoglycemia so a scheduled NovoLog was discontinued and she is on SSI only and her blood sugar is controlled.  Macrocytic Anemia -Stable.  Monitor. -Iron normal at 52 with an elevated ferritin on 10/11  Chest Pain, rule out ACS;chest pain has completely resolved -Chest pain a few days ago which is now completely resolved -EKG was relatively unremarkable and nonischemic; -Troponin was initially elevated likely demand ischemia in the setting of her nausea vomiting and her current comorbidities  Thrombocytosis -Reactive and stable.  She is already on anticoagulant.  Dysphagia/severe Protein Calorie Malnutrition. -On dysphagia 1 diet but not eating much so supplemented by tube feedings. -Hopefully with downsizing of trach patient will be able to tolerate swallowing better and can eventually have core track tube discontinued and avoid PEG tube placement -cont SLP    Data Reviewed: Basic Metabolic Panel: Recent Labs  Lab 07/17/20 0500 07/17/20 0500 07/18/20 0536 07/19/20 1117 07/20/20 0408 07/22/20 0500 07/23/20 0500  NA 128*   < > 133* 133* 132* 132* 133*  K 5.1   < > 5.4* 5.0 5.2* 5.4* 5.5*  CL 96*   < > 100 100 99 99 99  CO2 23    < > _0 GLUCOSE 257*   < > 115* 99 91 131* 145*  BUN 39*   < > 41* 41* 41* 42* 45*  CREATININE 1.10*   < > 1.00 1.04* 1.16* 1.10* 1.16*  CALCIUM 7.7*   < > 8.0* 7.7* 8.2* 8.6* 8.7*  MG 2.0  --  2.0 1.9 2.0  --   --   PHOS 3.1  --  3.7 3.1 3.1  --   --    < > = values in this interval not displayed.   Liver Function Tests: Recent Labs  Lab 07/17/20 0500 07/18/20 0536 07/19/20 1117 07/20/20 0408 07/22/20 0500  AST 36 30 33 30 40  ALT 53* 39 37 35 48*  ALKPHOS 205* 211* 213* 187* 268*  BILITOT 0.8 0.7 0.7 0.9 0.7  PROT 5.0* 5.4* 5.4* 5.5* 6.0*  ALBUMIN 1.4* 1.4* 1.6* 1.6* 1.9*   No results for input(s): LIPASE, AMYLASE in the last 168 hours.  Recent Labs  Lab 07/22/20 0500  AMMONIA 39*   CBC: Recent Labs  Lab 07/17/20 0500 07/17/20 0500 07/18/20 0536 07/19/20 0358 07/20/20 0408 07/21/20 0440 07/22/20 0500  WBC 14.8*   < > 16.9* 15.7* 17.5* 17.2* 16.2*  NEUTROABS 11.9*  --  13.4*  --  12.6*  --   --   HGB 9.8*   < > 10.8* 10.1* 10.1* 10.9* 11.6*  HCT 32.3*   < > 34.3* 33.6* 33.0* 34.9* 37.9  MCV 106.3*   < > 106.5* 105.3* 106.1* 107.1* 106.8*  PLT 425*   < > 485* 512* 531* 591* 643*   < > = values in this interval not displayed.   Cardiac Enzymes: No results for input(s): CKTOTAL, CKMB, CKMBINDEX, TROPONINI in the last 168 hours. BNP (last 3 results) No results for input(s): BNP in the last 8760 hours.  ProBNP (last 3 results) No results for input(s): PROBNP in the last 8760 hours.  CBG: Recent Labs  Lab 07/22/20 1955 07/22/20 2342 07/23/20 0330 07/23/20 0759 07/23/20 1209  GLUCAP 125* 105* 83 90 194*    Recent Results (from the past 240 hour(s))  Culture, Urine     Status: None   Collection Time: 07/14/20  1:15 PM   Specimen: Urine, Random  Result Value Ref Range Status   Specimen Description URINE, RANDOM  Final   Special Requests NONE  Final   Culture   Final    NO GROWTH Performed at [facility], [place] [address],  [place], [place] [address]    Report Status 07/15/2020 FINAL  Final  Culture, blood (routine x 2)     Status: None   Collection Time: 07/14/20  2:06 PM   Specimen: BLOOD LEFT ARM  Result Value Ref Range Status   Specimen Description BLOOD LEFT ARM  Final   Special Requests   Final    BOTTLES DRAWN AEROBIC ONLY Blood Culture adequate volume   Culture   Final    NO GROWTH 5 DAYS Performed at [facility], [place] [address] [place], [place] [address]    Report Status 07/19/2020 FINAL  Final  Culture, blood (routine x 2)     Status: None   Collection Time: 07/14/20  2:10 PM   Specimen: BLOOD LEFT HAND  Result Value Ref Range Status   Specimen Description BLOOD LEFT HAND  Final   Special Requests   Final    BOTTLES DRAWN AEROBIC ONLY Blood Culture adequate volume   Culture   Final    NO GROWTH 5 DAYS Performed at [facility], [place] [address], [place], [place] [address]    Report Status 07/19/2020 FINAL  Final     Studies: DG CHEST PORT 1 VIEW  Result Date: 07/22/2020 CLINICAL DATA:  Tracheostomy present.  Difficulty breathing EXAM: PORTABLE CHEST 1 VIEW COMPARISON:  July 20, 2020 and July 04, 2020 FINDINGS: Tracheostomy catheter tip is 5.3 cm above carina. Enteric tube tip is below the diaphragm. Central catheter tip is in the superior vena cava near the cavoatrial junction. No pneumothorax. There is stable interstitial thickening mid lower lung regions, stable. No consolidation or airspace opacity. No pleural effusions. Heart is mildly enlarged, stable. Pulmonary vascularity is normal. No adenopathy. No bone lesions. IMPRESSION: Tube and catheter positions as described without pneumothorax. Probable fibrotic change in the lower lung regions. No frank edema or consolidation. Stable cardiac prominence. Electronically Signed   By: Lowella Grip III M.D.   On: 07/22/2020  12:18    Scheduled Meds: . amiodarone  200 mg Per Tube Daily  . apixaban  5 mg Per Tube BID  .  chlorhexidine gluconate (MEDLINE KIT)  15 mL Mouth Rinse BID  . Chlorhexidine Gluconate Cloth  6 each Topical Q0600  . feeding supplement (PROSource TF)  45 mL Per Tube Daily  . furosemide  20 mg Per Tube Daily  . influenza vac split quadrivalent PF  0.5 mL Intramuscular Tomorrow-1000  . insulin aspart  0-9 Units Subcutaneous Q4H  . lactulose  20 g Per Tube Daily  . levETIRAcetam  500 mg Per Tube BID  . mouth rinse  15 mL Mouth Rinse 10 times per day  . pantoprazole sodium  40 mg Per Tube Daily  . predniSONE  30 mg Per NG tube Q breakfast  . silver sulfADIAZINE   Topical BID  . sodium chloride flush  10-40 mL Intracatheter Q12H   Continuous Infusions: . feeding supplement (OSMOLITE 1.5 CAL) 1,000 mL (07/23/20 0028)    Active Problems:   Pulmonary HTN (HCC)   Acute encephalopathy   Acute respiratory failure (HCC)   Oxygen desaturation   Cardiogenic shock (HCC)   Acute renal failure with tubular necrosis ([place])   RVF (right ventricular failure) ([place])   E. coli UTI   Protein-calorie malnutrition, severe   Acute respiratory distress syndrome (ARDS) due to COVID-19 virus ([place])   Anoxic brain injury ([place])   Tracheostomy status ([place])   Septic shock [facility])   Consultants:  PCCM Transfer  Cardiology  Neurology  Procedures: 9/15 ETT >> 9/15 R IJ TL CVC >>9/27 9/27 PICC > 10/4 Trach 10/14 downsized to 4.0 cuffless trach  Antibiotics: Anti-infectives (From admission, onward)   Start     Dose/Rate Route Frequency Ordered Stop   07/14/20 1400  meropenem (MERREM) 2 g in sodium chloride 0.9 % 100 mL IVPB  Status:  Discontinued        2 g 200 mL/hr over 30 Minutes Intravenous Every 8 hours 07/14/20 1339 07/22/20 1051   07/06/20 1400  meropenem (MERREM) 2 g in sodium chloride 0.9 % 100 mL IVPB        2 g 200 mL/hr over 30 Minutes Intravenous Every 8 hours 07/06/20 1124 07/12/20 2230   07/05/20 1000  vancomycin (VANCOREADY) IVPB 500 mg/100 mL  Status:  Discontinued        500  mg 100 mL/hr over 60 Minutes Intravenous Every 12 hours 07/04/20 2127 07/05/20 0902   07/04/20 2200  ceFEPIme (MAXIPIME) 2 g in sodium chloride 0.9 % 100 mL IVPB  Status:  Discontinued        2 g 200 mL/hr over 30 Minutes Intravenous Every 12 hours 07/04/20 0929 07/06/20 1124   07/04/20 2200  vancomycin (VANCOREADY) IVPB 500 mg/100 mL  Status:  Discontinued        500 mg 100 mL/hr over 60 Minutes Intravenous Every 12 hours 07/04/20 1149 07/04/20 2127   07/04/20 2200  vancomycin (VANCOCIN) IVPB 1000 mg/200 mL premix        1,000 mg 200 mL/hr over 60 Minutes Intravenous  Once 07/04/20 2127 07/05/20 0032   07/04/20 0930  vancomycin (VANCOCIN) IVPB 1000 mg/200 mL premix  Status:  Discontinued        1,000 mg 200 mL/hr over 60 Minutes Intravenous  Once 07/04/20 0929 07/04/20 2127   07/04/20 0930  vancomycin (VANCOREADY) IVPB 500 mg/100 mL  Status:  Discontinued        500 mg 100  mL/hr over 60 Minutes Intravenous Every 12 hours 07/04/20 0929 07/04/20 1149   06/23/20 1015  vancomycin (VANCOREADY) IVPB 750 mg/150 mL  Status:  Discontinued        750 mg 150 mL/hr over 60 Minutes Intravenous Every 12 hours 06/23/20 1007 06/24/20 0849   06/23/20 1000  piperacillin-tazobactam (ZOSYN) IVPB 3.375 g        3.375 g 12.5 mL/hr over 240 Minutes Intravenous Every 8 hours 06/23/20 0948 06/28/20 0805   06/22/20 1230  vancomycin (VANCOREADY) IVPB 1250 mg/250 mL        1,250 mg 166.7 mL/hr over 90 Minutes Intravenous  Once 06/22/20 1226 06/22/20 1535   06/22/20 1230  piperacillin-tazobactam (ZOSYN) IVPB 3.375 g        3.375 g 100 mL/hr over 30 Minutes Intravenous  Once 06/22/20 1226 06/22/20 1502       Time spent: [place]  MD  [facility] Pager [phone]. If 7PM-7AM, please contact night-coverage at [email or web address] 07/23/2020, 1:55 PM  LOS: 31 days

## 2020-07-24 DIAGNOSIS — G9340 Encephalopathy, unspecified: Secondary | ICD-10-CM | POA: Diagnosis not present

## 2020-07-24 LAB — GLUCOSE, CAPILLARY
Glucose-Capillary: 140 mg/dL — ABNORMAL HIGH (ref 70–99)
Glucose-Capillary: 165 mg/dL — ABNORMAL HIGH (ref 70–99)
Glucose-Capillary: 261 mg/dL — ABNORMAL HIGH (ref 70–99)
Glucose-Capillary: 79 mg/dL (ref 70–99)
Glucose-Capillary: 91 mg/dL (ref 70–99)
Glucose-Capillary: 97 mg/dL (ref 70–99)

## 2020-07-24 LAB — BASIC METABOLIC PANEL
Anion gap: 8 (ref 5–15)
BUN: 46 mg/dL — ABNORMAL HIGH (ref 6–20)
CO2: 25 mmol/L (ref 22–32)
Calcium: 8.7 mg/dL — ABNORMAL LOW (ref 8.9–10.3)
Chloride: 98 mmol/L (ref 98–111)
Creatinine, Ser: 1.12 mg/dL — ABNORMAL HIGH (ref 0.44–1.00)
GFR, Estimated: 54 mL/min — ABNORMAL LOW (ref >60)
Glucose, Bld: 139 mg/dL — ABNORMAL HIGH (ref 70–99)
Potassium: 5.2 mmol/L — ABNORMAL HIGH (ref 3.5–5.1)
Sodium: 131 mmol/L — ABNORMAL LOW (ref 135–145)

## 2020-07-24 NOTE — Progress Notes (Signed)
[facility] PROGRESS NOTE  Carolyn Proctor DOB: Nov 03, 1960 DOA: 06/22/2020 PCP: Lilian Coma., MD  Status: Inpatient--Remains inpatient appropriate because:Altered mental status, Ongoing diagnostic testing needed not appropriate for outpatient work up, Unsafe d/c plan and Inpatient level of care appropriate due to severity of illness   Dispo: The patient is from: Home              Anticipated d/c is to: LTAC              Anticipated d/c date is: > 3 days              Patient currently is not medically stable to d/c. UNSAFE DC PLAN-patient has been liberated from mechanical ventilation as of 10/4 with subsequent placement of cuffed trach on same date.  Plans are to downsize and transition to cuffless trach on 10/14. Current neurological deficits consistent with sequelae of anoxic brain injury   Code Status: Full Family Communication: None present. DVT prophylaxis: Eliquis Vaccination status: Has not been vaccinated against Covid  HPI: 59 year old with PMH paroxysmal atrial fibrillation, DVT/PE on Eliquis, history of heart failure with preserved EF, CKD stage III, rheumatoid arthritis, hypercoagulable state secondary to increased protein S and decreased protein C as well as decreased Antithrombin III, emphysema with pulmonary fibrosis, chronic venous insufficiency with chronic foot ulcers and mixed connective tissue disease.  Patient presented to the ER because she felt like she was having stroke symptoms.  While being treated by EMS she developed a left facial droop and became unresponsive.  Her ventilatory effort was poor therefore she required bag-valve-mask by EMS.  Additional evaluation revealed the patient be profoundly hypoglycemic as well as hypotensive.  On arrival to the ED she was intubated for airway protection.  Initial CT of the head was negative.  Despite administration of D50 she persisted with refractory hypoglycemia therefore was placed on glucose infusion.   He was admitted by the critical care team.  Because of her history of chronic steroid use and hypotension she was initiated on stress dose steroids.  It was felt that her shock was cardiogenic in nature and she was started on diuresis.  Because of ongoing encephalopathy after admission extubation was delayed.  Tracheostomy was placed on 10/4 and she was eventually transitioned from ventilatory support to trach collar.  She also developed HCAP w/ sputum cultures positive for ESBL positive E. coli in the context of underlying interstitial lung disease and connective tissue disorder as evidenced by acute pneumonitis and pulmonary edema.  She was found to have acute systolic heart failure and heart failure team was consulted.Because of A. fib with RVR and NSVT she required an amiodarone drip.  Of note her initial urine cx as well as her sputum was positive for ESBL positive E. coli and it is likely sepsis physiology contributed to patient's presenting symptoms including hypoxemia with concurrent altered mentation and hypoglycemia resulting in further altered mentation and inability to protect airway.    She was transferred out of ICU to hospitalist service on 10/7 but had a T-max of 102 F on 10/8  therefore she was pancultured to determine source of infection.  Was empirically started on meropenem with plans to continue for total of 14 days.  All cultures have been negative.  Cardiology had been managing her CHF/ Lasix and on 10/11 transitioned her from IV to oral Lasix.  Given her debilitated state right heart cath not indicated and performing would not change her current medical management  at this point. Plan to pursue at a later date.  Patient has been awake and following commands.  She is working with SLP but not tolerating some swallow and PMV secondary size of current trach.  She is currently on tube feedings via a core track but it is hopeful that if trach can be downsized quickly that she will pass swallowing  evaluations and will not require a PEG tube.   Subjective: Seen and examined.  Communicating very well by nodding her head.  Denied having any complaints.  Objective: Vitals:   07/24/20 1203 07/24/20 1224  BP: 112/84   Pulse: (!) 107 (!) 108  Resp:    Temp: 98.6 F (37 C)   SpO2: 96% 98%    Intake/Output Summary (Last 24 hours) at 07/24/2020 1415 Last data filed at 07/24/2020 0914 Gross per 24 hour  Intake 120 ml  Output 1350 ml  Net -1230 ml   Filed Weights   07/21/20 0328 07/22/20 0500 07/24/20 0424  Weight: 54 kg 58.9 kg 56.8 kg    Exam:  General exam: Appears calm and comfortable with cuffless trach and cortrak placed. Respiratory system: Clear to auscultation. Respiratory effort normal. Cardiovascular system: S1 & S2 heard, RRR. No JVD, murmurs, rubs, gallops or clicks. No pedal edema. Gastrointestinal system: Abdomen is nondistended, soft and nontender. No organomegaly or masses felt. Normal bowel sounds heard. Central nervous system: Alert and seems to be oriented.  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 -Has remained stable on trach collar with a #6 cuffed [place] trach-trach team consulted and patient will be changed out to a #4 cuffless trach on 10/14 -Completed meropenem but in context of recurrent fevers this was resumed on 07/14/2020 recommendation from PCCM was to continue for 14 days total -On chronic prednisone at home 5 mg daily.  Was started on prednisone 40 mg daily for acute pneumonitis in setting of ILD on 07/05/2020.  Dose decreased to 30 mg daily on 10/14 with recommendation to decrease by 10 mg weekly until at home dose. -Dr. Lake Bells discussed with her outpatient rheumatologist and they feel  that this is a vasculitis picture and given her underlying infections and they cannot use more aggressive immunosuppression other than prednisone at this time -Given trach placed on 10/4 would not be a candidate for SNF until 11/4.  If improves at [facility] could benefit from CIR if eligible -10/15 new rhonchi on exam so x-ray was checked which did not show any acute pathology.  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 Ephriam Jenkins with [facility]). - previously treated with rituximab >> last in November 2020 - defer further immunosuppressive therapy while she is being treated for infection -She will need close rheumatology follow-up after discharge.   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 Atrial flutter/atrial tachycardia with runs of NSVT - new this admission. - advanced heart failure team following intermittently; initially she was in cardiogenic shock due to right ventricular failure/cor pulmonale -echo repeated -improved with milrinone and diuresis-only on oral Lasix as of 10/11 -Had additional shock physiology secondary to sepsis from ESBL E. coli pneumonia antibiotics subsequently changed from cefepime to [facility] physiology  has resolved and patient remains stable - continue amiodarone 200 mg daily for suppression of RVR and NSVT; EF 35 to 40%-currently not on beta-blockers or ARB/ACE I -(-) 5600 cc since admission -continue strict I's/O -Cardiology documented that a right heart cath would not change management at this point and they feel that she needs to be more stable therefore can pursue this later. -Continue Eliquis for stroke prophylaxis  Acute metabolic encephalopathy from hypoglycemia, hypotension, hypoxia. Suspected anoxic brain injury Seizure in setting of hypoxia with respiratory failure. - currently alert and oriented -Noted  patient with left upper extremity weakness which apparently was not present prior to admission therefore suspect some degree of anoxic brain injury -continue Keppra-check levetiracetam level in am 10/15 -Delirium precautions -Brain MRI with no evidence of vasculitis   Severe physical deconditioning -Secondary to above -Hopeful for LTAC placement although if continues to improve may be eligible to be evaluated by inpatient rehab -Continue PT and OT -Continue Prevalon boots  Pulmonary Embolism in June 2021. -Continue Eliquis  Bilateral Pressure Ulcers - Wound Consult - BID Dressing Change w/ silvadene Wound / Incision (Open or Dehisced) 07/13/20 (MASD) Moisture Associated Skin Damage Buttocks Right;Left Skin excoriated on buttocks (Active)  Date First Assessed/Time First Assessed: 07/13/20 1200   Wound Type: (MASD) Moisture Associated Skin Damage  Location: Buttocks  Location Orientation: Right;Left  Wound Description (Comments): Skin excoriated on buttocks  Present on Admission: No    Assessments 07/13/2020  2:00 PM 07/23/2020  8:58 PM  Dressing Type Foam - Lift dressing to assess site every shift Foam - Lift dressing to assess site every shift  Dressing Changed New --  Dressing Status Clean;Dry;Intact Clean;Dry;Intact  Dressing Change Frequency PRN --  Site / Wound Assessment Red Clean;Dry;Pink  Peri-wound Assessment Excoriated Intact  Margins Attached edges (approximated) --  Closure None --  Drainage Amount Scant --  Drainage Description No odor;Serous --  Non-staged Wound Description Not applicable --  Treatment Cleansed --     No Linked orders to display     Wound / Incision (Open or Dehisced) 07/14/20 (MASD) Moisture Associated Skin Damage Thigh Anterior;Right;Left (Active)  Date First Assessed/Time First Assessed: 07/14/20 2207   Wound Type: (MASD) Moisture Associated Skin Damage  Location: Thigh  Location Orientation: Anterior;Right;Left  Present on Admission: No     Assessments 07/14/2020 10:07 PM 07/20/2020  9:56 PM  Dressing Type None None  Site / Wound Assessment Pink --  Closure None --  Drainage Amount Minimal --  Drainage Description Serous --  Treatment Cleansed Cleansed     No Linked orders to display     Wound / Incision (Open or Dehisced) 07/19/20 Non-pressure wound Pretibial Distal;Left (Active)  Date First Assessed/Time First Assessed: 07/19/20 1100   Wound Type: Non-pressure wound  Location: Pretibial  Location Orientation: Distal;Left  Present on Admission: (c)     Assessments 07/20/2020 11:34 AM 07/24/2020  4:37 AM  Dressing Type -- Non adherent;Gauze (Comment)  Dressing Changed -- Changed  Dressing Status -- Clean;Dry;Intact  Dressing Change Frequency -- Daily  Site / Wound Assessment -- Pink;Dry  Peri-wound Assessment -- Intact  Wound Length (cm) 3 cm --  Wound Width (cm) 3 cm --  Wound Surface Area (cm^2) 9 cm^2 --  Margins -- Attached edges (approximated)  Closure -- None  Drainage Amount -- None  Treatment -- Cleansed     No Linked orders to display     Wound / Incision (Open or Dehisced) 07/19/20 Non-pressure wound Pretibial Distal;Right (  Active)  Date First Assessed/Time First Assessed: 07/19/20 1100   Wound Type: Non-pressure wound  Location: Pretibial  Location Orientation: Distal;Right  Present on Admission: (c)     Assessments 07/20/2020 11:33 AM 07/24/2020  4:37 AM  Dressing Type Non adherent;Gauze (Comment) Impregnated gauze (petrolatum);Other (Comment)  Dressing Changed Changed Changed  Dressing Status Clean;Dry;Intact Clean;Dry;Intact  Dressing Change Frequency Daily Daily  Site / Wound Assessment Friable;Pink;Yellow --  Peri-wound Assessment Intact Intact  Margins Unattached edges (unapproximated) Attached edges (approximated)  Closure None None  Drainage Amount Minimal None  Drainage Description Purulent;No odor --  Treatment -- Cleansed     No Linked orders to display    Hypernatremia  (Resolved)/hyponatremia/hyperkalemia -Continue to follow labs periodically -K remains elevated- cont Lasix and follow labs daily  ?Liver cirrhosis/Abnormal LFTs, -LFTs normal now. -Continues to have significant diarrhea/watery stools therefore will check ammonia level to determine if lactulose dose needs to be adjusted or discontinued -Patient was not on Chronulac or lactulose prior to admission-given persistent electrolyte abnormalities and watery stools will decrease chronulac from 30g daily to 20 g-plan ck ammonia level on Monday-if stable and loose stools persist will further taper chronulac -CT abdomen and pelvis 1 year ago without any evidence of cirrhosis -CTA that included abdomen during this admission revealed liver contour somewhat nodular and there may be a component of underlying cirrhosis. -Suspect this is more reflective of passive hepatic congestion from patient's severe heart failure and presentation with acute systolic heart failure than of true cirrhosis  Steroid Induced Hyperglycemia. -Resolved and in fact had hypoglycemia so a scheduled NovoLog was discontinued and she is on SSI only and her blood sugar is controlled.  Macrocytic Anemia -Stable.  Monitor. -Iron normal at 52 with an elevated ferritin on 10/11  Chest Pain, rule out ACS;chest pain has completely resolved -Chest pain a few days ago which is now completely resolved -EKG was relatively unremarkable and nonischemic; -Troponin was initially elevated likely demand ischemia in the setting of her nausea vomiting and her current comorbidities  Thrombocytosis -Reactive and stable.  She is already on anticoagulant.  Dysphagia/severe Protein Calorie Malnutrition. -On dysphagia 1 diet but not eating much so supplemented by tube feedings. -Hopefully with downsizing of trach patient will be able to tolerate swallowing better and can eventually have core track tube discontinued and avoid PEG tube placement -cont  SLP    Data Reviewed: Basic Metabolic Panel: Recent Labs  Lab 07/18/20 0536 07/18/20 0536 07/19/20 1117 07/20/20 0408 07/22/20 0500 07/23/20 0500 07/24/20 0446  NA 133*   < > 133* 132* 132* 133* 131*  K 5.4*   < > 5.0 5.2* 5.4* 5.5* 5.2*  CL 100   < > 100 99 99 99 98  CO2 26   < > _0 GLUCOSE 115*   < > 99 91 131* 145* 139*  BUN 41*   < > 41* 41* 42* 45* 46*  CREATININE 1.00   < > 1.04* 1.16* 1.10* 1.16* 1.12*  CALCIUM 8.0*   < > 7.7* 8.2* 8.6* 8.7* 8.7*  MG 2.0  --  1.9 2.0  --   --   --   PHOS 3.7  --  3.1 3.1  --   --   --    < > = values in this interval not displayed.   Liver Function Tests: Recent Labs  Lab 07/18/20 0536 07/19/20 1117 07/20/20 0408 07/22/20 0500  AST 30 33 30 40  ALT 39 37  35 48*  ALKPHOS 211* 213* 187* 268*  BILITOT 0.7 0.7 0.9 0.7  PROT 5.4* 5.4* 5.5* 6.0*  ALBUMIN 1.4* 1.6* 1.6* 1.9*   No results for input(s): LIPASE, AMYLASE in the last 168 hours. Recent Labs  Lab 07/22/20 0500  AMMONIA 39*   CBC: Recent Labs  Lab 07/18/20 0536 07/19/20 0358 07/20/20 0408 07/21/20 0440 07/22/20 0500  WBC 16.9* 15.7* 17.5* 17.2* 16.2*  NEUTROABS 13.4*  --  12.6*  --   --   HGB 10.8* 10.1* 10.1* 10.9* 11.6*  HCT 34.3* 33.6* 33.0* 34.9* 37.9  MCV 106.5* 105.3* 106.1* 107.1* 106.8*  PLT 485* 512* 531* 591* 643*   Cardiac Enzymes: No results for input(s): CKTOTAL, CKMB, CKMBINDEX, TROPONINI in the last 168 hours. BNP (last 3 results) No results for input(s): BNP in the last 8760 hours.  ProBNP (last 3 results) No results for input(s): PROBNP in the last 8760 hours.  CBG: Recent Labs  Lab 07/23/20 2033 07/24/20 0012 07/24/20 0404 07/24/20 0827 07/24/20 1230  GLUCAP 127* 79 97 91 140*    No results found for this or any previous visit (from the past 240 hour(s)).   Studies: No results found.  Scheduled Meds: . amiodarone  200 mg Per Tube Daily  . apixaban  5 mg Per Tube BID  . chlorhexidine gluconate (MEDLINE KIT)   15 mL Mouth Rinse BID  . Chlorhexidine Gluconate Cloth  6 each Topical Q0600  . feeding supplement (PROSource TF)  45 mL Per Tube Daily  . furosemide  20 mg Per Tube Daily  . influenza vac split quadrivalent PF  0.5 mL Intramuscular Tomorrow-1000  . insulin aspart  0-9 Units Subcutaneous Q4H  . lactulose  20 g Per Tube Daily  . levETIRAcetam  500 mg Per Tube BID  . mouth rinse  15 mL Mouth Rinse 10 times per day  . pantoprazole sodium  40 mg Per Tube Daily  . predniSONE  30 mg Per NG tube Q breakfast  . silver sulfADIAZINE   Topical BID  . sodium chloride flush  10-40 mL Intracatheter Q12H   Continuous Infusions: . feeding supplement (OSMOLITE 1.5 CAL) 1,000 mL (07/23/20 0028)    Active Problems:   Pulmonary HTN (HCC)   Acute encephalopathy   Acute respiratory failure (HCC)   Oxygen desaturation   Cardiogenic shock (HCC)   Acute renal failure with tubular necrosis (HCC)   RVF (right ventricular failure) (HCC)   E. coli UTI   Protein-calorie malnutrition, severe   Acute respiratory distress syndrome (ARDS) due to COVID-19 virus ([place])   Anoxic brain injury ([place])   Tracheostomy status ([place])   Septic shock [facility])   Consultants:  PCCM Transfer  Cardiology  Neurology  Procedures: 9/15 ETT >> 9/15 R IJ TL CVC >>9/27 9/27 PICC > 10/4 Trach 10/14 downsized to 4.0 cuffless trach  Antibiotics: Anti-infectives (From admission, onward)   Start     Dose/Rate Route Frequency Ordered Stop   07/14/20 1400  meropenem (MERREM) 2 g in sodium chloride 0.9 % 100 mL IVPB  Status:  Discontinued        2 g 200 mL/hr over 30 Minutes Intravenous Every 8 hours 07/14/20 1339 07/22/20 1051   07/06/20 1400  meropenem (MERREM) 2 g in sodium chloride 0.9 % 100 mL IVPB        2 g 200 mL/hr over 30 Minutes Intravenous Every 8 hours 07/06/20 1124 07/12/20 2230   07/05/20 1000  vancomycin (VANCOREADY) IVPB 500 mg/100 mL  Status:  Discontinued        500 mg 100 mL/hr over 60 Minutes  Intravenous Every 12 hours 07/04/20 2127 07/05/20 0902   07/04/20 2200  ceFEPIme (MAXIPIME) 2 g in sodium chloride 0.9 % 100 mL IVPB  Status:  Discontinued        2 g 200 mL/hr over 30 Minutes Intravenous Every 12 hours 07/04/20 0929 07/06/20 1124   07/04/20 2200  vancomycin (VANCOREADY) IVPB 500 mg/100 mL  Status:  Discontinued        500 mg 100 mL/hr over 60 Minutes Intravenous Every 12 hours 07/04/20 1149 07/04/20 2127   07/04/20 2200  vancomycin (VANCOCIN) IVPB 1000 mg/200 mL premix        1,000 mg 200 mL/hr over 60 Minutes Intravenous  Once 07/04/20 2127 07/05/20 0032   07/04/20 0930  vancomycin (VANCOCIN) IVPB 1000 mg/200 mL premix  Status:  Discontinued        1,000 mg 200 mL/hr over 60 Minutes Intravenous  Once 07/04/20 0929 07/04/20 2127   07/04/20 0930  vancomycin (VANCOREADY) IVPB 500 mg/100 mL  Status:  Discontinued        500 mg 100 mL/hr over 60 Minutes Intravenous Every 12 hours 07/04/20 0929 07/04/20 1149   06/23/20 1015  vancomycin (VANCOREADY) IVPB 750 mg/150 mL  Status:  Discontinued        750 mg 150 mL/hr over 60 Minutes Intravenous Every 12 hours 06/23/20 1007 06/24/20 0849   06/23/20 1000  piperacillin-tazobactam (ZOSYN) IVPB 3.375 g        3.375 g 12.5 mL/hr over 240 Minutes Intravenous Every 8 hours 06/23/20 0948 06/28/20 0805   06/22/20 1230  vancomycin (VANCOREADY) IVPB 1250 mg/250 mL        1,250 mg 166.7 mL/hr over 90 Minutes Intravenous  Once 06/22/20 1226 06/22/20 1535   06/22/20 1230  piperacillin-tazobactam (ZOSYN) IVPB 3.375 g        3.375 g 100 mL/hr over 30 Minutes Intravenous  Once 06/22/20 1226 06/22/20 1502       Time spent: 23 minutes    Darliss Cheney  MD  [facility] Pager [phone]. If 7PM-7AM, please contact night-coverage at [email or web address] 07/24/2020, 2:15 PM  LOS: 32 days

## 2020-07-25 DIAGNOSIS — Z9889 Other specified postprocedural states: Secondary | ICD-10-CM | POA: Diagnosis not present

## 2020-07-25 DIAGNOSIS — J9611 Chronic respiratory failure with hypoxia: Secondary | ICD-10-CM | POA: Diagnosis not present

## 2020-07-25 LAB — BASIC METABOLIC PANEL
Anion gap: 10 (ref 5–15)
BUN: 46 mg/dL — ABNORMAL HIGH (ref 6–20)
CO2: 25 mmol/L (ref 22–32)
Calcium: 8.7 mg/dL — ABNORMAL LOW (ref 8.9–10.3)
Chloride: 98 mmol/L (ref 98–111)
Creatinine, Ser: 1.11 mg/dL — ABNORMAL HIGH (ref 0.44–1.00)
GFR, Estimated: 55 mL/min — ABNORMAL LOW (ref >60)
Glucose, Bld: 159 mg/dL — ABNORMAL HIGH (ref 70–99)
Potassium: 5.3 mmol/L — ABNORMAL HIGH (ref 3.5–5.1)
Sodium: 133 mmol/L — ABNORMAL LOW (ref 135–145)

## 2020-07-25 LAB — GLUCOSE, CAPILLARY
Glucose-Capillary: 101 mg/dL — ABNORMAL HIGH (ref 70–99)
Glucose-Capillary: 128 mg/dL — ABNORMAL HIGH (ref 70–99)
Glucose-Capillary: 161 mg/dL — ABNORMAL HIGH (ref 70–99)
Glucose-Capillary: 169 mg/dL — ABNORMAL HIGH (ref 70–99)
Glucose-Capillary: 231 mg/dL — ABNORMAL HIGH (ref 70–99)
Glucose-Capillary: 49 mg/dL — ABNORMAL LOW (ref 70–99)
Glucose-Capillary: 74 mg/dL (ref 70–99)
Glucose-Capillary: 85 mg/dL (ref 70–99)

## 2020-07-25 LAB — LEVETIRACETAM LEVEL: Levetiracetam Lvl: 27.4 ug/mL (ref 10.0–40.0)

## 2020-07-25 LAB — AMMONIA: Ammonia: 37 umol/L — ABNORMAL HIGH (ref 9–35)

## 2020-07-25 MED ORDER — LACTULOSE 10 GM/15ML PO SOLN
10.0000 g | Freq: Every day | ORAL | Status: DC
  Administered 2020-07-26: 10 g
  Filled 2020-07-25: qty 30

## 2020-07-25 MED ORDER — SODIUM BICARBONATE 650 MG PO TABS
650.0000 mg | ORAL_TABLET | Freq: Once | ORAL | Status: AC
  Administered 2020-07-25: 650 mg
  Filled 2020-07-25 (×2): qty 1

## 2020-07-25 MED ORDER — DEXTROSE 50 % IV SOLN
INTRAVENOUS | Status: AC
  Administered 2020-07-25: 50 mL
  Filled 2020-07-25: qty 50

## 2020-07-25 MED ORDER — PANCRELIPASE (LIP-PROT-AMYL) 10440-39150 UNITS PO TABS
20880.0000 [IU] | ORAL_TABLET | Freq: Once | ORAL | Status: AC
  Administered 2020-07-25: 20880 [IU]
  Filled 2020-07-25: qty 2

## 2020-07-25 NOTE — Procedures (Signed)
Cortrak  Person Inserting Tube:  Carolyn Proctor, RD Tube Type:  Cortrak - 43 inches Tube Location:  Right nare Initial Placement:  Stomach Secured by: Bridle Technique Used to Measure Tube Placement:  Documented cm marking at nare/ corner of mouth Cortrak Secured At:  76 cm    Cortrak Tube Team Note:  Consult received to place a Cortrak feeding tube. Pt had existing Cortrak tube that became clogged; RN attempted unclogging protocol but unsuccessful. RD to replace Cortrak tube and bridle  No x-ray is required. RN may begin using tube.   If the tube becomes dislodged please keep the tube and contact the Cortrak team at [email or web address] (password [email or web address]) for replacement.  If after hours and replacement cannot be delayed, place a NG tube and confirm placement with an abdominal x-ray.    Carolyn Passey MS, RDN, LDN, CNSC Registered Dietitian III Clinical Nutrition RD Pager and On-Call Pager Number Located in [place]

## 2020-07-25 NOTE — Progress Notes (Signed)
[facility] PROGRESS NOTE  Carolyn Proctor YQM:[id number] DOB: November 15, 1960 DOA: 06/22/2020 PCP: Lilian Coma., MD  Status: Inpatient--Remains inpatient appropriate because:Altered mental status, Ongoing diagnostic testing needed not appropriate for outpatient work up, Unsafe d/c plan and Inpatient level of care appropriate due to severity of illness   Dispo: The patient is from: Home              Anticipated d/c is to: LTAC              Anticipated d/c date is: > 3 days              Patient currently is not medically stable to d/c. UNSAFE DC PLAN-patient has been liberated from mechanical ventilation as of 10/4 with subsequent placement of cuffed trach on same date.  Plans are to downsize and transition to cuffless trach on 10/14. Current neurological deficits consistent with sequelae of anoxic brain injury   Code Status: Full Family Communication: Patient as well as family member at bedside on 10/14 DVT prophylaxis: Eliquis Vaccination status: Has not been vaccinated against Covid  HPI: 59 year old with PMH paroxysmal atrial fibrillation, DVT/PE on Eliquis, history of heart failure with preserved EF, CKD stage III, rheumatoid arthritis, hypercoagulable state secondary to increased protein S and decreased protein C as well as decreased Antithrombin III, emphysema with pulmonary fibrosis, chronic venous insufficiency with chronic foot ulcers and mixed connective tissue disease.  Patient presented to the ER because she felt like she was having stroke symptoms.  While being treated by EMS she developed a left facial droop and became unresponsive.  Her ventilatory effort was poor therefore she required bag-valve-mask by EMS.  Additional evaluation revealed the patient be profoundly hypoglycemic as well as hypotensive.  On arrival to the ED she was intubated for airway protection.  Initial CT of the head was negative.  Despite administration of D50 she persisted with refractory hypoglycemia  therefore was placed on glucose infusion.  He was admitted by the critical care team.  Because of her history of chronic steroid use and hypotension she was initiated on stress dose steroids.  It was felt that her shock was cardiogenic in nature and she was started on diuresis.  Because of ongoing encephalopathy after admission extubation was delayed.  Tracheostomy was placed on 10/4 and she was eventually transitioned from ventilatory support to trach collar.  She also developed HCAP w/ sputum cultures positive for ESBL positive E. coli in the context of underlying interstitial lung disease and connective tissue disorder as evidenced by acute pneumonitis and pulmonary edema.  She was found to have acute systolic heart failure and heart failure team was consulted.Because of A. fib with RVR and NSVT she required an amiodarone drip.  Of note her initial urine cx as well as her sputum was positive for ESBL positive E. coli and it is likely sepsis physiology contributed to patient's presenting symptoms including hypoxemia with concurrent altered mentation and hypoglycemia resulting in further altered mentation and inability to protect airway.    She was transferred out of ICU to hospitalist service on 10/7 but had a T-max of 102 F on 10/8  therefore she was pancultured to determine source of infection.  Was empirically started on meropenem with plans to continue for total of 14 days.  All cultures have been negative.  Cardiology had been managing her CHF/ Lasix and on 10/11 transitioned her from IV to oral Lasix.  Given her debilitated state right heart cath not indicated and  performing would not change her current medical management at this point. Plan to pursue at a later date.  Patient has been awake and following commands.  She is working with SLP but not tolerating some swallow and PMV secondary size of current trach.  She is currently on tube feedings via a core track but it is hopeful that if trach can be  downsized quickly that she will pass swallowing evaluations and will not require a PEG tube.   Subjective: Patient alert and oriented.  Trying to phonate more than previous week.  Admits to being hungry and at this time denies early satiety.  Encouragement given regarding obvious improvements in status compared to at time of admission.  States that pured food is not appealing is hopeful diet can be advanced soon.  Discussed with her necessity of strengthening muscles that control swallowing prior to ability to advance diet.  She nodded understanding.  Objective: Vitals:   07/25/20 1002 07/25/20 1223  BP:    Pulse:  98  Resp: (!) 22 16  Temp:    SpO2:  100%    Intake/Output Summary (Last 24 hours) at 07/25/2020 1239 Last data filed at 07/24/2020 2014 Gross per 24 hour  Intake --  Output 1900 ml  Net -1900 ml   Filed Weights   07/22/20 0500 07/24/20 0424 07/25/20 0500  Weight: 58.9 kg 56.8 kg 54.8 kg    Exam:  Constitutional: NAD, calm, comfortable  Neck: normal, supple, no masses, no thyromegaly; midline trach in place #6 Shiley cuffed-cuff deflated and PMV in place Respiratory: Posterior lungs sounds w/ rhonchi in bases. Normal respiratory effort. No accessory muscle use. #4.0 cuffless trach 28% FIO2 Cardiovascular: Regular rate and rhythm, no murmurs / rubs / gallops. No extremity edema. 2+ pedal pulses. No carotid bruits.  Abdomen: no tenderness, no masses palpated. No hepatosplenomegaly. Bowel sounds positive.Cortrak tube in place for feedings and meds.  Has rectal tube in place with large volume dark brown liquid stool in drainage bag. Musculoskeletal: no clubbing / cyanosis. No joint deformity upper and lower extremities. Good ROM, no contractures. Normal muscle tone.  Skin: Unremarkable except for areas of blackened skin at the distal tips of fingers of hands and on the toes which patient reports are painful to touch.  Has Prevalon boots in place so unable to adequately  assess skin of feet, ankles or tibia region. Neurologic: CN 2-12 grossly intact. Sensation intact, DTR not assessed strength 4/5 right upper extremity, bilateral lower extremities are extremely weak with patient unable to lift legs off the bed so strength is about 1-2/5.  Shoulder strength is 3/5 laterally.  Left upper extremity extremely weak especially distally with strength ~1/5. Psychiatric: Normal judgment and insight. Alert and oriented x 3 although did miss year stating 1921 but was able to regroup and state 2021.Marland Kitchen Normal mood.    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 -Has remained stable on trach collar with a #6 cuffed Shiley trach-trach team consulted and patient will be changed out to a #4 cuffless trach on 10/14 -Completed meropenem -had recurrent fevers so resumed on 07/14/2020 at at recommendation from PCCM  For 14 days -On chronic prednisone at home 5 mg daily.  Was started on prednisone 40 mg daily for acute pneumonitis in setting of ILD on 07/05/2020.  Dose decreased to  30 mg daily on 10/14 with recommendation to decrease by 10 mg weekly until at home dose. -Dr. Lake Bells discussed with her outpatient rheumatologist who felt presentation consistent with vasculitis picture.  Unfortunately given her underlying acute infections cannot use more aggressive immunosuppression other than prednisone  -Trach placed on 10/4 would not be a candidate for SNF until 11/4.  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 Ephriam Jenkins with [facility]). - previously treated with rituximab >> last in November 2020 - defer further immunosuppressive therapy while she is being treated for acute infection -She will need close rheumatology follow-up after discharge.   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 Atrial flutter/atrial tachycardia with runs of NSVT - new this admission. - Advanced heart failure team following intermittently; initially she was in cardiogenic shock due to right ventricular failure/cor pulmonale -echo repeated -improved with milrinone and diuresis-only on oral Lasix as of 10/11 -Had additional shock physiology secondary to sepsis from ESBL E. coli pneumonia antibiotics subsequently changed from cefepime to [facility] physiology has resolved and patient remains stable -Continue amiodarone 200 mg daily for suppression of RVR and NSVT; EF 35 to 40%-currently not on beta-blockers or ARB/ACE I -(-) 14,000 cc since admission; continue strict I's/O -Cardiology documented that a right heart cath would not change management at this point and they feel that she needs to be more stable; can pursue as outpatient -Continue Eliquis for stroke prophylaxis  Acute metabolic encephalopathy from hypoglycemia, hypotension, hypoxia. Suspected anoxic brain injury Seizure in setting of hypoxia with respiratory failure. -Currently alert and oriented -Noted patient with left upper extremity weakness which apparently was not present prior to admission therefore suspect some degree of anoxic brain injury -Continue Keppra- levetiracetam level ordered for 10/15 but does not appear to have been collected -Brain MRI without evidence of vasculitis   Severe physical deconditioning -Secondary to above -Hopeful for LTAC placement although if continues to improve may be eligible to be  evaluated by inpatient rehab -Continue PT and OT -Continue Prevalon boots  Pulmonary Embolism in June 2021. -Continue Eliquis  Bilateral Pressure Ulcers - Wound Consult - BID Dressing Change w/ silvadene Wound / Incision (Open or Dehisced) 07/13/20 (MASD) Moisture Associated Skin Damage Buttocks Right;Left Skin excoriated on buttocks (Active)  Date First Assessed/Time First Assessed: 07/13/20 1200   Wound Type: (MASD) Moisture Associated Skin Damage  Location: Buttocks  Location Orientation: Right;Left  Wound Description (Comments): Skin excoriated on buttocks  Present on Admission: No    Assessments 07/13/2020  2:00 PM 07/25/2020  4:05 AM  Dressing Type Foam - Lift dressing to assess site every shift --  Dressing Changed New --  Dressing Status Clean;Dry;Intact Clean;Dry;Intact  Dressing Change Frequency PRN --  Site / Wound Assessment Red --  Peri-wound Assessment Excoriated --  Margins Attached edges (approximated) --  Closure None --  Drainage Amount Scant --  Drainage Description No odor;Serous --  Non-staged Wound Description Not applicable --  Treatment Cleansed --     No Linked orders to display     Wound / Incision (Open or Dehisced) 07/14/20 (MASD) Moisture Associated  Skin Damage Thigh Anterior;Right;Left (Active)  Date First Assessed/Time First Assessed: 07/14/20 2207   Wound Type: (MASD) Moisture Associated Skin Damage  Location: Thigh  Location Orientation: Anterior;Right;Left  Present on Admission: No    Assessments 07/14/2020 10:07 PM 07/25/2020  4:05 AM  Dressing Type None --  Dressing Status -- Clean;Dry;Intact  Site / Wound Assessment Pink --  Closure None --  Drainage Amount Minimal --  Drainage Description Serous --  Treatment Cleansed --     No Linked orders to display     Wound / Incision (Open or Dehisced) 07/19/20 Non-pressure wound Pretibial Distal;Left (Active)  Date First Assessed/Time First Assessed: 07/19/20 1100   Wound Type: Non-pressure  wound  Location: Pretibial  Location Orientation: Distal;Left  Present on Admission: (c)     Assessments 07/20/2020 11:34 AM 07/25/2020  4:05 AM  Dressing Status -- Clean;Dry;Intact  Wound Length (cm) 3 cm --  Wound Width (cm) 3 cm --  Wound Surface Area (cm^2) 9 cm^2 --     No Linked orders to display     Wound / Incision (Open or Dehisced) 07/19/20 Non-pressure wound Pretibial Distal;Right (Active)  Date First Assessed/Time First Assessed: 07/19/20 1100   Wound Type: Non-pressure wound  Location: Pretibial  Location Orientation: Distal;Right  Present on Admission: (c)     Assessments 07/20/2020 11:33 AM 07/25/2020  4:05 AM  Dressing Type Non adherent;Gauze (Comment) --  Dressing Changed Changed --  Dressing Status Clean;Dry;Intact Clean;Dry;Intact  Dressing Change Frequency Daily --  Site / Wound Assessment Friable;Pink;Yellow --  Peri-wound Assessment Intact --  Margins Unattached edges (unapproximated) --  Closure None --  Drainage Amount Minimal --  Drainage Description Purulent;No odor --     No Linked orders to display    Hypernatremia (Resolved)/hyponatremia/hyperkalemia -Continue to follow labs periodically -K remains elevated- cont Lasix and follow labs daily  ?Liver cirrhosis/Abnormal LFTs, -LFTs normal now. -Continues to have significant diarrhea/watery stools therefore will check ammonia level to determine if lactulose dose needs to be adjusted or discontinued -Patient was not on Chronulac or lactulose prior to admission-given persistent electrolyte abnormalities and watery stools will decrease chronulac from 30g daily to 20 g-ammonia level on 10/18 stable at 37 therefore will decrease Chronulac further to 10 g daily and if ammonia level remains appropriate as of Thursday this week likely can discontinue Chronulac. -CT abdomen and pelvis 1 year ago without any evidence of cirrhosis -CTA that included abdomen during this admission revealed liver contour somewhat  nodular and there may be a component of underlying cirrhosis. -Suspect this is more reflective of passive hepatic congestion from patient's severe heart failure and presentation with acute systolic heart failure than of true cirrhosis  Steroid Induced Hyperglycemia. -Resolved -experiencing hypoglycemia w/ scheduled Novolog (using for tube feeding coverage) therefore will dc and focus on SSI only  Macrocytic Anemia -Stable.  Monitor. -Iron normal at 52 with an elevated ferritin on 10/11  Chest Pain, rule out ACS;chest pain has completely resolved -Chest pain a few days ago which is now completely resolved -EKG was relatively unremarkable and nonischemic; -Troponin was initially elevated likely demand ischemia in the setting of her nausea vomiting and her current comorbidities  Thrombocytosis -Reactive and stable.  She is already on anticoagulant.  Dysphagia/severe Protein Calorie Malnutrition. -On dysphagia 1 diet but not eating much so supplemented by tube feedings.  Patient does not like the taste or texture of pured foods -Hopefully with downsizing of trach patient will be able to tolerate swallowing better and  can eventually have core track tube discontinued and avoid PEG tube placement -cont SLP    Data Reviewed: Basic Metabolic Panel: Recent Labs  Lab 07/19/20 1117 07/19/20 1117 07/20/20 0408 07/22/20 0500 07/23/20 0500 07/24/20 0446 07/25/20 0437  NA 133*   < > 132* 132* 133* 131* 133*  K 5.0   < > 5.2* 5.4* 5.5* 5.2* 5.3*  CL 100   < > 99 99 99 98 98  CO2 25   < > '26 24 25 25 25  ' GLUCOSE 99   < > 91 131* 145* 139* 159*  BUN 41*   < > 41* 42* 45* 46* 46*  CREATININE 1.04*   < > 1.16* 1.10* 1.16* 1.12* 1.11*  CALCIUM 7.7*   < > 8.2* 8.6* 8.7* 8.7* 8.7*  MG 1.9  --  2.0  --   --   --   --   PHOS 3.1  --  3.1  --   --   --   --    < > = values in this interval not displayed.   Liver Function Tests: Recent Labs  Lab 07/19/20 1117 07/20/20 0408  07/22/20 0500  AST 33 30 40  ALT 37 35 48*  ALKPHOS 213* 187* 268*  BILITOT 0.7 0.9 0.7  PROT 5.4* 5.5* 6.0*  ALBUMIN 1.6* 1.6* 1.9*   No results for input(s): LIPASE, AMYLASE in the last 168 hours. Recent Labs  Lab 07/22/20 0500 07/25/20 0438  AMMONIA 39* 37*   CBC: Recent Labs  Lab 07/19/20 0358 07/20/20 0408 07/21/20 0440 07/22/20 0500  WBC 15.7* 17.5* 17.2* 16.2*  NEUTROABS  --  12.6*  --   --   HGB 10.1* 10.1* 10.9* 11.6*  HCT 33.6* 33.0* 34.9* 37.9  MCV 105.3* 106.1* 107.1* 106.8*  PLT 512* 531* 591* 643*   Cardiac Enzymes: No results for input(s): CKTOTAL, CKMB, CKMBINDEX, TROPONINI in the last 168 hours. BNP (last 3 results) No results for input(s): BNP in the last 8760 hours.  ProBNP (last 3 results) No results for input(s): PROBNP in the last 8760 hours.  CBG: Recent Labs  Lab 07/24/20 1602 07/24/20 2032 07/25/20 0037 07/25/20 0433 07/25/20 0801  GLUCAP 261* 165* 74 161* 101*    No results found for this or any previous visit (from the past 240 hour(s)).   Studies: No results found.  Scheduled Meds: . amiodarone  200 mg Per Tube Daily  . apixaban  5 mg Per Tube BID  . chlorhexidine gluconate (MEDLINE KIT)  15 mL Mouth Rinse BID  . Chlorhexidine Gluconate Cloth  6 each Topical Q0600  . feeding supplement (PROSource TF)  45 mL Per Tube Daily  . furosemide  20 mg Per Tube Daily  . influenza vac split quadrivalent PF  0.5 mL Intramuscular Tomorrow-1000  . insulin aspart  0-9 Units Subcutaneous Q4H  . [START ON 07/26/2020] lactulose  10 g Per Tube Daily  . levETIRAcetam  500 mg Per Tube BID  . mouth rinse  15 mL Mouth Rinse 10 times per day  . pantoprazole sodium  40 mg Per Tube Daily  . predniSONE  30 mg Per NG tube Q breakfast  . silver sulfADIAZINE   Topical BID  . sodium chloride flush  10-40 mL Intracatheter Q12H   Continuous Infusions: . feeding supplement (OSMOLITE 1.5 CAL) 1,000 mL (07/24/20 1528)    Active Problems:   Pulmonary  HTN (HCC)   Acute encephalopathy   Acute respiratory failure ([place])   Oxygen  desaturation   Cardiogenic shock (HCC)   Acute renal failure with tubular necrosis (HCC)   RVF (right ventricular failure) ([place])   E. coli UTI   Protein-calorie malnutrition, severe   Acute respiratory distress syndrome (ARDS) due to COVID-19 virus ([place])   Anoxic brain injury ([place])   Tracheostomy status ([place])   Septic shock [facility])   Consultants:  PCCM Transfer  Cardiology  Neurology  Procedures: 9/15 ETT >> 9/15 R IJ TL CVC >>9/27 9/27 PICC > 10/4 Trach 10/14 downsized to 4.0 cuffless trach  Antibiotics: Anti-infectives (From admission, onward)   Start     Dose/Rate Route Frequency Ordered Stop   07/14/20 1400  meropenem (MERREM) 2 g in sodium chloride 0.9 % 100 mL IVPB  Status:  Discontinued        2 g 200 mL/hr over 30 Minutes Intravenous Every 8 hours 07/14/20 1339 07/22/20 1051   07/06/20 1400  meropenem (MERREM) 2 g in sodium chloride 0.9 % 100 mL IVPB        2 g 200 mL/hr over 30 Minutes Intravenous Every 8 hours 07/06/20 1124 07/12/20 2230   07/05/20 1000  vancomycin (VANCOREADY) IVPB 500 mg/100 mL  Status:  Discontinued        500 mg 100 mL/hr over 60 Minutes Intravenous Every 12 hours 07/04/20 2127 07/05/20 0902   07/04/20 2200  ceFEPIme (MAXIPIME) 2 g in sodium chloride 0.9 % 100 mL IVPB  Status:  Discontinued        2 g 200 mL/hr over 30 Minutes Intravenous Every 12 hours 07/04/20 0929 07/06/20 1124   07/04/20 2200  vancomycin (VANCOREADY) IVPB 500 mg/100 mL  Status:  Discontinued        500 mg 100 mL/hr over 60 Minutes Intravenous Every 12 hours 07/04/20 1149 07/04/20 2127   07/04/20 2200  vancomycin (VANCOCIN) IVPB 1000 mg/200 mL premix        1,000 mg 200 mL/hr over 60 Minutes Intravenous  Once 07/04/20 2127 07/05/20 0032   07/04/20 0930  vancomycin (VANCOCIN) IVPB 1000 mg/200 mL premix  Status:  Discontinued        1,000 mg 200 mL/hr over 60 Minutes Intravenous  Once  07/04/20 0929 07/04/20 2127   07/04/20 0930  vancomycin (VANCOREADY) IVPB 500 mg/100 mL  Status:  Discontinued        500 mg 100 mL/hr over 60 Minutes Intravenous Every 12 hours 07/04/20 0929 07/04/20 1149   06/23/20 1015  vancomycin (VANCOREADY) IVPB 750 mg/150 mL  Status:  Discontinued        750 mg 150 mL/hr over 60 Minutes Intravenous Every 12 hours 06/23/20 1007 06/24/20 0849   06/23/20 1000  piperacillin-tazobactam (ZOSYN) IVPB 3.375 g        3.375 g 12.5 mL/hr over 240 Minutes Intravenous Every 8 hours 06/23/20 0948 06/28/20 0805   06/22/20 1230  vancomycin (VANCOREADY) IVPB 1250 mg/250 mL        1,250 mg 166.7 mL/hr over 90 Minutes Intravenous  Once 06/22/20 1226 06/22/20 1535   06/22/20 1230  piperacillin-tazobactam (ZOSYN) IVPB 3.375 g        3.375 g 100 mL/hr over 30 Minutes Intravenous  Once 06/22/20 1226 06/22/20 1502       Time spent: [place]  [facility] Pager [phone]. If 7PM-7AM, please contact night-coverage at [email or web address] 07/25/2020, 12:39 PM  LOS: 33 days

## 2020-07-25 NOTE — Plan of Care (Signed)
  Problem: Education: ?Goal: Knowledge of General Education information will improve ?Description: Including pain rating scale, medication(s)/side effects and non-pharmacologic comfort measures ?Outcome: Progressing ?  ?Problem: Clinical Measurements: ?Goal: Ability to maintain clinical measurements within normal limits will improve ?Outcome: Progressing ?Goal: Will remain free from infection ?Outcome: Progressing ?Goal: Diagnostic test results will improve ?Outcome: Progressing ?Goal: Respiratory complications will improve ?Outcome: Progressing ?Goal: Cardiovascular complication will be avoided ?Outcome: Progressing ?  ?Problem: Nutrition: ?Goal: Adequate nutrition will be maintained ?Outcome: Progressing ?  ?Problem: Activity: ?Goal: Risk for activity intolerance will decrease ?Outcome: Progressing ?  ?

## 2020-07-25 NOTE — Progress Notes (Signed)
Physical Therapy Treatment Patient Details Name: Carolyn Proctor MRN: [id number] DOB: July 24, 1961 Today's Date: 07/25/2020    History of Present Illness Patient is a 59 year old female. She presented to the hospital with reports of stroke like sympotms (stroke work-up negative) but pt found to be hypoglycemic, hypotensive, cardiogenic shock, with resp failure requiring intubation.  She had a complicated hospital course which included intubation for a few weeks. She is currently on a trach. Pt also with encephalopathy with suspected anoxic brain injury. PMH: lupus, a-fib, CHF, tachycardia, thrombocytoisis, right ventricular dysfucntion, rhematoid lung disease; RA    PT Comments    Pt remains profoundly weak but agreeable to working with PT.  She required max A of 2 for transfers and trunk control.  Pt able to participate with exercises but required increased time, rest breaks, and AAROM at times.  O2 sats and BP stable.  Did note HR elevated with transfers via telemetry reading but not significantly elevated when palpated radial pulse - RN aware.     Follow Up Recommendations  LTACH     Equipment Recommendations  Wheelchair (measurements PT);Wheelchair cushion (measurements PT);Hospital bed;3in1 (PT)    Recommendations for Other Services       Precautions / Restrictions Precautions Precautions: Fall Precaution Comments: trach, watch HR, soft Prevalon boots    Mobility  Bed Mobility Overal bed mobility: Needs Assistance Bed Mobility: Rolling;Sidelying to Sit Rolling: +2 for physical assistance;Max assist Sidelying to sit: +2 for physical assistance;Max assist;HOB elevated       General bed mobility comments: Two person max assist to roll partially to R side, progress bil legs over EOB and come up to sitting EOB.  Transfers Overall transfer level: Needs assistance Equipment used: 2 person hand held assist Transfers: Sit to/from [facility] Sit to Stand: +2  physical assistance;Max assist Stand pivot transfers: +2 physical assistance;Max assist       General transfer comment: Two person max assist to stand, pt hooking elbows to attempt to use her UEs to assist.  Bil knees blocked by therapist and bed pad used to help facilitate hip extension.  Cues for upright head and chest "look up".  Althought pt takes weight on her feet, she was unable to step around to the chair, therapists manually turned with her. Performed sit to stand x 2  Ambulation/Gait             General Gait Details: unable at this time.    Stairs             Wheelchair Mobility    Modified Rankin (Stroke Patients Only)       Balance Overall balance assessment: Needs assistance Sitting-balance support: Feet supported;Bilateral upper extremity supported Sitting balance-Leahy Scale: Zero Sitting balance - Comments: Pt with weak trunk and unable to maintain sitting without max A Postural control: Other (comment) (LOB all directions) Standing balance support: Bilateral upper extremity supported Standing balance-Leahy Scale: Zero Standing balance comment: max two person assist.                             Cognition Arousal/Alertness: Lethargic Behavior During Therapy: Flat affect Overall Cognitive Status: Impaired/Different from baseline Area of Impairment: Attention;Following commands;Problem solving                   Current Attention Level: Sustained   Following Commands: Follows one step commands with increased time     Problem Solving: Slow  processing;Decreased initiation;Requires verbal cues;Requires tactile cues        Exercises General Exercises - Lower Extremity Ankle Circles/Pumps: AAROM;Both;10 reps;Supine Long Arc Quad: AROM;Both;10 reps;Seated (increased time and AAROM on last 2 reps) Heel Slides: AAROM;Both;5 reps;Supine Hip ABduction/ADduction: AAROM;Both;5 reps;Supine    General Comments General comments (skin  integrity, edema, etc.): Pt on 28% FIO2 5 LPM trach collar.  Noted telemetry reading as high as 192 bpm with transfers but waveform did not appear to be that fast.  HR only staying elevated for a few seconds before returning to 100-120 bpm.  Palpated and HR ~100 bpm when telemetry reading 180's-190's.  Notified RN who reports noticing similar difference between telemetry and dynamap readings.      Pertinent Vitals/Pain Pain Assessment: Faces Pain Score: 0-No pain Faces Pain Scale: Hurts a little bit Pain Location: BUE L>R with movement  Pain Descriptors / Indicators: Discomfort Pain Intervention(s): Limited activity within patient's tolerance;Monitored during session;Repositioned    Home Living                      Prior Function            PT Goals (current goals can now be found in the care plan section) Acute Rehab PT Goals Patient Stated Goal: none stated, agreeable to working with therapies  PT Goal Formulation: Patient unable to participate in goal setting Time For Goal Achievement: 07/30/20 Potential to Achieve Goals: Fair Progress towards PT goals: Progressing toward goals    Frequency    Min 2X/week      PT Plan Current plan remains appropriate    Co-evaluation              AM-PAC PT "6 Clicks" Mobility   Outcome Measure  Help needed turning from your back to your side while in a flat bed without using bedrails?: Total Help needed moving from lying on your back to sitting on the side of a flat bed without using bedrails?: Total Help needed moving to and from a bed to a chair (including a wheelchair)?: Total Help needed standing up from a chair using your arms (e.g., wheelchair or bedside chair)?: Total Help needed to walk in hospital room?: Total Help needed climbing 3-5 steps with a railing? : Total 6 Click Score: 6    End of Session Equipment Utilized During Treatment: Oxygen;Gait belt Activity Tolerance: Patient limited by fatigue Patient  left: in chair;with call bell/phone within reach;with chair alarm set Nurse Communication: Mobility status PT Visit Diagnosis: Other abnormalities of gait and mobility (R26.89);Muscle weakness (generalized) (M62.81);Difficulty in walking, not elsewhere classified (R26.2)     Time: 6962-9528 PT Time Calculation (min) (ACUTE ONLY): 24 min  Charges:  $Therapeutic Exercise: 8-22 mins $Therapeutic Activity: 8-22 mins                     Abran Richard, PT Acute Rehab Services Pager [phone] [facility] [place] 07/25/2020, 1:59 PM

## 2020-07-25 NOTE — Progress Notes (Signed)
NAME:  Carolyn Proctor, MRN:  [id number], DOB:  1961/01/18, LOS: 5 ADMISSION DATE:  06/22/2020, CONSULTATION DATE:  9/15 REFERRING MD:  Tyrone Nine, CHIEF COMPLAINT:  Confusion, refractory hypoglycemia   Brief History   84 yoF at home who complained to family she felt like she was having a stroke, then developed left facial droop and went unresponsive.  Required BVM by EMS and noted to be hypoglycemic and hypotensive.  Intubated in ER for airway protection.  CTH negative.  Refractory hypoglycemia despite multiple D50 amps and required a drip.  PCCM called to admit.   [facility] hospitalist became primary for patient 10/7 and at that time PCCM began seeing patient once weekly for trach care.  We were called back 10/8 due to change in clinical picture.  Per RT report patient had episode of significant emesis after tracheal suction morning of 10/8.  This resulted in short-term tachypnea and increased supplemental oxygen demand.  On bedside assessment 10/8 patient is comfortably lying in bed on 20% aerosolized trach collar in no acute respiratory distress.  Past Medical History  PAF, DVT, PE on Eliquis HFpEF CKD stage III RA Hypercoagulable state (increased protein S antigen, decreased protein C activity, and decreased Antithrombin III) Centrilobular emphysema and pulmonary fibrosis- non smoker Chronic venous insufficiency w/ chronic foot ulcers  Diverticulitis   Significant Hospital Events   9/15 admitted/ intubated >> pulse dose steroids, diuresis, cardiogenic shock support 9/25 on full vent support, encephalopathy precluding extubation 9/26 not weaning, encephalopathic, dysautonomia, off milrinonoe 10/4 Trach  10/14 Downsized to #4 cuffless   Consults:  Neurology Advanced heart failure  Procedures:  9/15 ETT >> 9/15 R IJ TL CVC >>9/27 9/27 PICC >  10/4 Trach  Significant Diagnostic Tests:   9/15 CTH >> negative  9/15 CTA chest/ abd/ pelvis >> significant dilatation of RA and RV  consistent with R heart failure; moderate R and small L pleural effusions with associated atelectasis of both lower lobes; underlying chronic cystic and fibrotic lung disease; severe anasarca; nodular contour of liver suggestive of possible cirrhosis  9/15 Echo> LVEF 35-40%, mod decreased LV function, RVSP 75, severely reduced RV systolic function; R to left shunt noted, suggestive of PFO, RA severely dilated, mod pericardial effusion  9/15 CTA head and neck >> no significant carotid or vertebral artery stenosis in neck, no LVO, right jugular central venous catheter with probable surrounding hematoma   9/18 MR BRAIN>> no acute intracranial abnormality  9/28 MRI brain > diffuse pachymeningeal enhancement, bilateral cerebral convexities, sub centimeter extra axial focus of T1 hyperintensity along R temporal occiptal lobe, new  Micro Data:  9/15 BCx2 central line >> NG5D 9/15 BCx2 PIV >> Strept mitis 9/15 SARS2 >> negative 9/15 UC >> ESBL E coli 9/16 Trach asp cult >> Normal flora 9/27 blood > negative 9/27 resp > e coli ESBL  Antimicrobials:  9/15 vanc >> 9/17 9/15 zosyn >> 9/20  9/27 vanc > 9/28 9/27 cefepime > 9/29 9/29 meropenem > 10/5  Interim history/subjective:  Denies shortness of breath, cough or wheezing. Reports general weakness  Objective   Blood pressure 122/77, pulse 100, temperature 98.2 F (36.8 C), temperature source Oral, resp. rate (!) 22, height 5\' 11"  (1.803 m), weight 54.8 kg, last menstrual period 02/19/2013, SpO2 99 %.    FiO2 (%):  [28 %] 28 %   Intake/Output Summary (Last 24 hours) at 07/25/2020 1148 Last data filed at 07/24/2020 2014 Gross per 24 hour  Intake 120 ml  Output 1900 ml  Net -1780 ml   Filed Weights   07/22/20 0500 07/24/20 0424 07/25/20 0500  Weight: 58.9 kg 56.8 kg 54.8 kg   Physical Exam: General: Cachectic, chronically ill-appearing, no acute distress HENT: [place], AT, OP clear, MMM Neck: Trach in place, c/d/i Eyes: EOMI, no  scleral icterus Respiratory: Coarse breath sounds bilaterally.  No crackles, wheezing or rales Cardiovascular: RRR, -M/R/G, no JVD GI: BS+, soft, nontender Extremities:-Edema,-tenderness, mild upper extremities contractures Neuro: AAO x4, follows commands, profoundly weak  Resolved Hospital Problem list   ESBL E. 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 Ephriam Jenkins with [facility]). Dysphagia  DM  Physical deconditioning Cirrhosis Macrocytic anemia Thrombocytosis Pressure ulcers PE (June 2021) on DOAC Acute metabolic encephalopathy    Pulmonary Problem List  Tracheostomy dependence s/p prolonged critical illness ESBL producing E-coli HAP Acute pneumonitis Dysphagia   Discussion  Tolerating pureed diet. Globally weak. Downsized to shiley #4 cuffless on 10/14  Plan/rec -#4 tracheostomy in place --Continue PMV as tolerated with supervision. Working towards capping trials eventually. -Off antibiotics -Routine trach care  Pulmonary will continue to follow  Best practice:  Per primary   CC Time: 15 min  Rodman Pickle, M.D. [facility] Pulmonary/Critical Care Medicine 07/25/2020 11:54 AM

## 2020-07-25 NOTE — Progress Notes (Signed)
  Speech Language Pathology Treatment: Dysphagia  Patient Details Name: Carolyn Proctor MRN: [id number] DOB: August 11, 1961 Today's Date: 07/25/2020 Time: 3716-9678 SLP Time Calculation (min) (ACUTE ONLY): 14 min  Assessment / Plan / Recommendation Clinical Impression  Sp02 hovering between 89-94% during session - at baseline and with PMV in place.  Pt continues to achieve low volume, breathy voice with cues needed to maximize breath support. She can achieve improved volume/vocal quality when following cues, but has difficulty sustaining practice and is easily fatigued. She reports today that she feels lethargic.  She continues to require prompts to tuck chin for airway protection.  She does not like the pureed foods and intake has been minimal.  She demonstrated coughing with thin liquids when she did not tuck chin - and agreed that the chin tuck is more comfortable, prevents coughing.    Continue to allow pt to use PMV as often as possible - place valve whenever staff is in the room and remove before leaving.  Attempted instruction so that pt can donn/doff valve independently but she had difficulty coordinating movements.  Will continue efforts and trial advanced solids this week.    HPI HPI:  Patient is a 59 year old female. She presented to the hospital with reports of stroke like, left facial droop and unresponsiveness. She has had a complicated hospital stay which included intubation from 9/15-10/4 with trach placed on 10/4.  PMH: lupus, a-fib, CHF, tachycardia, thrombocytosis, right ventricular dysfunction, rheumatoid lung disease, RA. CT hyead and MRI 9/15 and 9/18 were negative for acute changes. MRI brain 9/28: bilateral cerebral convexities measuring up to 3 mm in greatest thickness. Appears to predominantly correspond with diffuse pachymeningeal dural thickening and enhancement. However, small bilateral subdural effusions difficult to exclude.       SLP Plan  Continue with current plan of  care       Recommendations  Diet recommendations: Dysphagia 1 (puree);Thin liquid Liquids provided via: Cup;Straw Medication Administration: Crushed with puree Supervision: Full supervision/cueing for compensatory strategies Compensations: Slow rate;Small sips/bites;Multiple dry swallows after each bite/sip;Chin tuck Postural Changes and/or Swallow Maneuvers: Chin tuck      Patient may use Passy-Muir Speech Valve: Caregiver trained to provide supervision PMSV Supervision: Full         Oral Care Recommendations: Oral care BID SLP Visit Diagnosis: Dysphagia, oropharyngeal phase (R13.12);Aphonia (R49.1) Plan: Continue with current plan of care       GO                Carolyn Proctor 07/25/2020, 11:01 AM  Estill Bamberg L. Tivis Ringer, [place] Office number [phone] Pager [phone]

## 2020-07-25 NOTE — Progress Notes (Signed)
RT note-Patient vocalizing with sp02 84%, suctioned and inner canula cleaned, no disposable cannula's at bedside. Ordered at this time, cleaning kits at bedside. Sp02 now 97%, fio2 remains at 28%

## 2020-07-25 NOTE — TOC Progression Note (Signed)
Transition of Care [facility]) - Progression Note    Patient Details  Name: MADDELYNN MOOSMAN MRN: [id number] Date of Birth: 1960-12-23  Transition of [facility]) CM/SW Contact  Pollie Friar, RN Phone Number: 07/25/2020, 1:43 PM  Clinical Narrative:    CM spoke to [facility] with [facility]. She hopes to have a bed for the patient tomorrow.  TOC following.   Expected Discharge Plan: Long Term Acute Care (LTAC) Barriers to Discharge: Continued Medical Work up  Expected Discharge Plan and Services Expected Discharge Plan: [place] (LTAC)                                               Social Determinants of Health (SDOH) Interventions    Readmission Risk Interventions No flowsheet data found.

## 2020-07-26 ENCOUNTER — Other Ambulatory Visit (HOSPITAL_COMMUNITY): Payer: BC Managed Care – PPO

## 2020-07-26 ENCOUNTER — Inpatient Hospital Stay
Admit: 2020-07-26 | Discharge: 2020-09-13 | Disposition: A | Discharge status: Skilled Nursing Facility | Payer: BC Managed Care – PPO | Source: Ambulatory Visit | Attending: Internal Medicine | Admitting: Internal Medicine

## 2020-07-26 DIAGNOSIS — I2781 Cor pulmonale (chronic): Secondary | ICD-10-CM | POA: Diagnosis present

## 2020-07-26 DIAGNOSIS — I272 Pulmonary hypertension, unspecified: Secondary | ICD-10-CM | POA: Diagnosis present

## 2020-07-26 DIAGNOSIS — J8410 Pulmonary fibrosis, unspecified: Secondary | ICD-10-CM | POA: Diagnosis present

## 2020-07-26 DIAGNOSIS — Z4659 Encounter for fitting and adjustment of other gastrointestinal appliance and device: Secondary | ICD-10-CM

## 2020-07-26 DIAGNOSIS — G931 Anoxic brain damage, not elsewhere classified: Secondary | ICD-10-CM | POA: Diagnosis present

## 2020-07-26 DIAGNOSIS — R1310 Dysphagia, unspecified: Secondary | ICD-10-CM

## 2020-07-26 DIAGNOSIS — J189 Pneumonia, unspecified organism: Secondary | ICD-10-CM

## 2020-07-26 DIAGNOSIS — T17908A Unspecified foreign body in respiratory tract, part unspecified causing other injury, initial encounter: Secondary | ICD-10-CM

## 2020-07-26 DIAGNOSIS — I5032 Chronic diastolic (congestive) heart failure: Secondary | ICD-10-CM | POA: Diagnosis present

## 2020-07-26 DIAGNOSIS — J9621 Acute and chronic respiratory failure with hypoxia: Secondary | ICD-10-CM | POA: Diagnosis present

## 2020-07-26 DIAGNOSIS — J9690 Respiratory failure, unspecified, unspecified whether with hypoxia or hypercapnia: Secondary | ICD-10-CM

## 2020-07-26 HISTORY — DX: Pulmonary hypertension, unspecified: I27.20

## 2020-07-26 HISTORY — DX: Pulmonary fibrosis, unspecified: J84.10

## 2020-07-26 HISTORY — DX: Acute and chronic respiratory failure with hypoxia: J96.21

## 2020-07-26 HISTORY — DX: Cor pulmonale (chronic): I27.81

## 2020-07-26 HISTORY — DX: Anoxic brain damage, not elsewhere classified: G93.1

## 2020-07-26 LAB — GLUCOSE, CAPILLARY
Glucose-Capillary: 86 mg/dL (ref 70–99)
Glucose-Capillary: 133 mg/dL — ABNORMAL HIGH (ref 70–99)
Glucose-Capillary: 122 mg/dL — ABNORMAL HIGH (ref 70–99)
Glucose-Capillary: 249 mg/dL — ABNORMAL HIGH (ref 70–99)

## 2020-07-26 LAB — BASIC METABOLIC PANEL
Anion gap: 8 (ref 5–15)
BUN: 44 mg/dL — ABNORMAL HIGH (ref 6–20)
CO2: 27 mmol/L (ref 22–32)
Calcium: 8.7 mg/dL — ABNORMAL LOW (ref 8.9–10.3)
Chloride: 97 mmol/L — ABNORMAL LOW (ref 98–111)
Creatinine, Ser: 1.09 mg/dL — ABNORMAL HIGH (ref 0.44–1.00)
GFR, Estimated: 56 mL/min — ABNORMAL LOW (ref >60)
Glucose, Bld: 127 mg/dL — ABNORMAL HIGH (ref 70–99)
Potassium: 4.9 mmol/L (ref 3.5–5.1)
Sodium: 132 mmol/L — ABNORMAL LOW (ref 135–145)

## 2020-07-26 MED ORDER — ONDANSETRON HCL 4 MG/2ML IJ SOLN: 4.0000 mg | Freq: Four times a day (QID) | INTRAMUSCULAR | 0 refills | Status: AC | PRN

## 2020-07-26 MED ORDER — CHLORHEXIDINE GLUCONATE 0.12 % MT SOLN
OROMUCOSAL | Status: AC
  Administered 2020-07-26: 15 mL
  Filled 2020-07-26: qty 15

## 2020-07-26 MED ORDER — ACETAMINOPHEN 160 MG/5ML PO SOLN: 650.0000 mg | Freq: Four times a day (QID) | ORAL | 0 refills | Status: AC | PRN

## 2020-07-26 MED ORDER — POLYETHYLENE GLYCOL 3350 17 G PO PACK: 17.0000 g | PACK | Freq: Every day | ORAL | 0 refills | Status: AC | PRN

## 2020-07-26 MED ORDER — PANTOPRAZOLE SODIUM 40 MG PO PACK: 40.0000 mg | PACK | Freq: Every day | ORAL | Status: AC

## 2020-07-26 MED ORDER — LEVETIRACETAM 100 MG/ML PO SOLN: 500.0000 mg | Freq: Two times a day (BID) | ORAL | 12 refills | Status: AC

## 2020-07-26 MED ORDER — FUROSEMIDE 20 MG PO TABS: 20.0000 mg | ORAL_TABLET | Freq: Every day | ORAL | Status: AC

## 2020-07-26 MED ORDER — PREDNISONE 10 MG PO TABS: 20.0000 mg | ORAL_TABLET | Freq: Every day | ORAL | 0 refills | Status: AC

## 2020-07-26 MED ORDER — CHLORHEXIDINE GLUCONATE 0.12% ORAL RINSE (MEDLINE KIT): 15.0000 mL | Freq: Two times a day (BID) | OROMUCOSAL | 0 refills | Status: AC

## 2020-07-26 MED ORDER — INSULIN ASPART 100 UNIT/ML ~~LOC~~ SOLN: 0.0000 [IU] | SUBCUTANEOUS | 11 refills | Status: AC

## 2020-07-26 MED ORDER — AMIODARONE HCL 200 MG PO TABS: 200.0000 mg | ORAL_TABLET | Freq: Every day | ORAL | Status: AC

## 2020-07-26 MED ORDER — INFLUENZA VAC SPLIT QUAD 0.5 ML IM SUSY: 0.5000 mL | PREFILLED_SYRINGE | INTRAMUSCULAR | 0 refills | Status: AC

## 2020-07-26 MED ORDER — SODIUM CHLORIDE 0.9% FLUSH
10.0000 mL | Freq: Two times a day (BID) | INTRAVENOUS | Status: AC
Start: 2020-07-26 — End: ?

## 2020-07-26 MED ORDER — LEVALBUTEROL HCL 0.63 MG/3ML IN NEBU: 0.6300 mg | INHALATION_SOLUTION | Freq: Four times a day (QID) | RESPIRATORY_TRACT | 12 refills | Status: AC | PRN

## 2020-07-26 MED ORDER — DIPHENHYDRAMINE-ZINC ACETATE 2-0.1 % EX CREA
TOPICAL_CREAM | Freq: Two times a day (BID) | CUTANEOUS | Status: DC | PRN
  Filled 2020-07-26: qty 28

## 2020-07-26 MED ORDER — APIXABAN 5 MG PO TABS: 5.0000 mg | ORAL_TABLET | Freq: Two times a day (BID) | ORAL | Status: AC

## 2020-07-26 MED ORDER — MELATONIN 3 MG PO TABS: 3.0000 mg | ORAL_TABLET | Freq: Every evening | ORAL | 0 refills | Status: AC | PRN

## 2020-07-26 MED ORDER — NITROGLYCERIN 0.4 MG SL SUBL: 0.4000 mg | SUBLINGUAL_TABLET | SUBLINGUAL | 12 refills | Status: AC | PRN

## 2020-07-26 MED ORDER — ALTEPLASE 2 MG IJ SOLR
2.0000 mg | Freq: Once | INTRAMUSCULAR | Status: AC
  Administered 2020-07-26: 2 mg

## 2020-07-26 MED ORDER — LACTULOSE 10 GM/15ML PO SOLN: 10.0000 g | Freq: Every day | ORAL | 0 refills | Status: AC

## 2020-07-26 MED ORDER — SILVER SULFADIAZINE 1 % EX CREA: TOPICAL_CREAM | Freq: Two times a day (BID) | CUTANEOUS | 0 refills | Status: AC

## 2020-07-26 MED ORDER — IPRATROPIUM BROMIDE 0.02 % IN SOLN: 0.5000 mg | Freq: Four times a day (QID) | RESPIRATORY_TRACT | 12 refills | Status: AC | PRN

## 2020-07-26 MED ORDER — WHITE PETROLATUM EX OINT: 1.0000 "application " | TOPICAL_OINTMENT | CUTANEOUS | 0 refills | Status: AC | PRN

## 2020-07-26 MED ORDER — DIPHENHYDRAMINE-ZINC ACETATE 2-0.1 % EX CREA: TOPICAL_CREAM | Freq: Two times a day (BID) | CUTANEOUS | 0 refills | Status: AC | PRN

## 2020-07-26 MED ORDER — PROSOURCE TF PO LIQD: 45.0000 mL | Freq: Every day | ORAL | Status: AC

## 2020-07-26 MED ORDER — OSMOLITE 1.5 CAL PO LIQD: 1000.0000 mL | ORAL | 0 refills | Status: AC

## 2020-07-26 MED ORDER — SODIUM CHLORIDE 0.9% FLUSH
10.0000 mL | INTRAVENOUS | Status: AC | PRN
Start: 2020-07-26 — End: ?

## 2020-07-26 MED ORDER — MORPHINE SULFATE (PF) 2 MG/ML IV SOLN
2.0000 mg | INTRAVENOUS | 0 refills | Status: AC | PRN
Start: 2020-07-26 — End: ?

## 2020-07-26 NOTE — Discharge Instructions (Signed)
Information on my medicine - ELIQUIS (apixaban)  This medication education was reviewed with me or my healthcare representative as part of my discharge preparation.  The pharmacist that spoke with me during my hospital stay was:  Onnie Boer, RPH-CPP  Why was Eliquis prescribed for you? Eliquis was prescribed to treat blood clots that may have been found in the veins of your legs (deep vein thrombosis) or in your lungs (pulmonary embolism) and to reduce the risk of them occurring again.  What do You need to know about Eliquis ? Take 5 mg tablet TWICE daily.  Eliquis may be taken with or without food.   Try to take the dose about the same time in the morning and in the evening. If you have difficulty swallowing the tablet whole please discuss with your pharmacist how to take the medication safely.  Take Eliquis exactly as prescribed and DO NOT stop taking Eliquis without talking to the doctor who prescribed the medication.  Stopping may increase your risk of developing a new blood clot.  Refill your prescription before you run out.  After discharge, you should have regular check-up appointments with your healthcare provider that is prescribing your Eliquis.    What do you do if you miss a dose? If a dose of ELIQUIS is not taken at the scheduled time, take it as soon as possible on the same day and twice-daily administration should be resumed. The dose should not be doubled to make up for a missed dose.  Important Safety Information A possible side effect of Eliquis is bleeding. You should call your healthcare provider right away if you experience any of the following: ? Bleeding from an injury or your nose that does not stop. ? Unusual colored urine (red or dark brown) or unusual colored stools (red or black). ? Unusual bruising for unknown reasons. ? A serious fall or if you hit your head (even if there is no bleeding).  Some medicines may interact with Eliquis and might increase  your risk of bleeding or clotting while on Eliquis. To help avoid this, consult your healthcare provider or pharmacist prior to using any new prescription or non-prescription medications, including herbals, vitamins, non-steroidal anti-inflammatory drugs (NSAIDs) and supplements.  This website has more information on Eliquis (apixaban): http://www.eliquis.com/eliquis/home

## 2020-07-26 NOTE — Progress Notes (Signed)
Report attempted  to [facility] at 1305 unsuccessfully.

## 2020-07-26 NOTE — Plan of Care (Signed)

## 2020-07-26 NOTE — TOC Transition Note (Signed)
Transition of Care [facility]) - CM/SW Discharge Note   Patient Details  Name: Carolyn Proctor MRN: [id number] Date of Birth: 1961-02-08  [facility] of [facility]) CM/SW Contact:  Pollie Friar, RN Phone Number: 07/26/2020, 11:22 AM   Clinical Narrative:    Pt is discharging to [facility] today. Pt will go to room 5E01. CM will update daughter and bedside RN. D/c packet at the desk.    Final next level of care: Long Term Acute Care (LTAC) Barriers to Discharge: No Barriers Identified   Patient Goals and CMS Choice   CMS Medicare.gov Compare Post Acute Care list provided to:: Patient Represenative (must comment) Choice offered to / list presented to : Adult Children  Discharge Placement                  Name of family member notified: Makeda--daughter Patient and family notified of of transfer: 07/26/20  Discharge Plan and Services                                     Social Determinants of Health (SDOH) Interventions     Readmission Risk Interventions No flowsheet data found.

## 2020-07-26 NOTE — Plan of Care (Signed)
  Problem: Education: Goal: Knowledge of General Education information will improve Description: Including pain rating scale, medication(s)/side effects and non-pharmacologic comfort measures Outcome: Progressing   Problem: Health Behavior/Discharge Planning: Goal: Ability to manage health-related needs will improve Outcome: Progressing   Problem: Clinical Measurements: Goal: Ability to maintain clinical measurements within normal limits will improve Outcome: Progressing Goal: Will remain free from infection Outcome: Progressing Goal: Diagnostic test results will improve Outcome: Progressing Goal: Respiratory complications will improve Outcome: Progressing Goal: Cardiovascular complication will be avoided Outcome: Progressing   Problem: Elimination: Goal: Will not experience complications related to bowel motility Outcome: Progressing Goal: Will not experience complications related to urinary retention Outcome: Progressing   Problem: Pain Managment: Goal: General experience of comfort will improve Outcome: Progressing   Problem: Safety: Goal: Ability to remain free from injury will improve Outcome: Progressing   

## 2020-07-26 NOTE — Progress Notes (Signed)
CSW received call from [facility] that they need verification that patient's benefits will not be cut from her employer before they can admit. CSW received call from daughter with HR number, and that patient will need to verify identify to get any information. CSW contacted patient's HR with her in the room to provide verification of identity and permission for HR to speak to CSW. HR sent documentation that patient's benefits will not be cut as long as she continues to pay her premiums, which she says she will. CSW sent documentation to [facility], and they can admit today. CSW updated RN and MD.  Laveda Abbe, [place] Social Worker [phone]

## 2020-07-26 NOTE — Discharge Summary (Addendum)
Physician Discharge Summary  Carolyn Proctor:[id number] DOB: 1961/02/10 DOA: 06/22/2020  PCP: Lilian Coma., MD  Admit date: 06/22/2020 Discharge date: 07/26/2020  Time spent: 38 minutes  Recommendations for LTAC Follow-up:  1. Patient will discharge to long-term acute care for additional treatments and rehabilitative therapies [facility]) 2. She will continue regular speech therapy sessions regarding swallowing, PMV training and speech and language 3. Patient with incidental finding of possible cirrhosis on admission CT.  No significant cirrhotic physiology and recent ammonia levels have been stable at 37.  Then repeating ammonia level on Thursday 10/21 and if remains stable will discontinue current Chronulac dose noting that patient is requiring a Flexi-Seal to manage loose and watery stools. 4. Patient was transition to a #4 Shiley cuffless trach on 10/14.  PCCM following.  Current recommendation is for PMV during the day with eventual transition to full capping in hopes of eventual decannulation 5. Patient presented with acute on chronic heart failure exacerbation with associated cardiogenic shock.  She is stable on oral Lasix continues to have negative fluid balance. 6. Patient with underlying ILD and connective tissue disorder and was on chronic prednisone 5 mg daily at home.  As of acute pneumonitis her dosage had been increased during the hospitalization with recommendation to decrease to 20 mg daily starting 10/20 and subsequently going down by 10 mg weekly until at home dose of 5 mg.    Discharge Diagnoses:  Active Problems:   Pulmonary HTN (HCC)   Acute encephalopathy   Acute respiratory failure (HCC)   Oxygen desaturation   Cardiogenic shock (HCC)   Acute renal failure with tubular necrosis (HCC)   RVF (right ventricular failure) (HCC)   E. coli UTI   Protein-calorie malnutrition, severe   Acute respiratory distress syndrome (ARDS) due to COVID-19 virus  (HCC)   Anoxic brain injury ([place])   S/P tracheoplasty   Septic shock (HCC)   Chronic respiratory failure with hypoxia ([place])   Discharge Condition: Stable  Diet recommendation: Dysphagia 1 or pured diet with thin liquids with Osmolite tube feedings 60 cc/h along with Prosource feeding supplement daily  Filed Weights   07/22/20 0500 07/24/20 0424 07/25/20 0500  Weight: 58.9 kg 56.8 kg 54.8 kg    History of present illness:  59 year old with PMH paroxysmal atrial fibrillation, DVT/PE on Eliquis, history of heart failure with preserved EF, CKD stage III, rheumatoid arthritis, hypercoagulable state secondary to increased protein S and decreased protein C as well as decreased Antithrombin III, emphysema with pulmonary fibrosis, chronic venous insufficiency with chronic foot ulcers and mixed connective tissue disease.  Patient presented to the ER because she felt like she was having stroke symptoms.  While being treated by EMS she developed a left facial droop and became unresponsive.  Her ventilatory effort was poor therefore she required bag-valve-mask by EMS.  Additional evaluation revealed the patient be profoundly hypoglycemic as well as hypotensive.  On arrival to the ED she was intubated for airway protection.  Initial CT of the head was negative.  Despite administration of D50 she persisted with refractory hypoglycemia therefore was placed on glucose infusion.  He was admitted by the critical care team.  Because of her history of chronic steroid use and hypotension she was initiated on stress dose steroids.  It was felt that her shock was cardiogenic in nature and she was started on diuresis.  Because of ongoing encephalopathy after admission extubation was delayed.  Tracheostomy was placed on 10/4 and she was  eventually transitioned from ventilatory support to trach collar.  She also developed HCAP w/ sputum cultures positive for ESBL positive E. coli in the context of underlying interstitial  lung disease and connective tissue disorder as evidenced by acute pneumonitis and pulmonary edema.  She was found to have acute systolic heart failure and heart failure team was consulted.Because of A. fib with RVR and NSVT she required an amiodarone drip.  Of note her initial urine cx as well as her sputum was positive for ESBL positive E. coli and it is likely sepsis physiology contributed to patient's presenting symptoms including hypoxemia with concurrent altered mentation and hypoglycemia resulting in further altered mentation and inability to protect airway.    She was transferred out of ICU to hospitalist service on 10/7 but had a T-max of 102 F on 10/8  therefore she was pancultured to determine source of infection.  Was empirically started on meropenem with plans to continue for total of 14 days.  All cultures have been negative.  Cardiology had been managing her CHF/ Lasix and on 10/11 transitioned her from IV to oral Lasix.  Given her debilitated state right heart cath not indicated and performing would not change her current medical management at this point. Plan to pursue at a later date.  Patient has been awake and following commands.  She is working with SLP with current diet recommendation dysphagia 1/pured diet with thin liquids combination with continuous tube feedings via a core track.   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 -Has remained stable on trach collar with a #6 cuffed Shiley trach-trach team consulted and patient will be changed out to a #4 cuffless trach on 10/14 -Completed meropenem -had recurrent fevers so resumed on 07/14/2020 at at recommendation from PCCM  For 14 days- has been completed -On chronic prednisone at home 5 mg daily.  Was started on prednisone 40 mg  daily for acute pneumonitis in setting of ILDon 07/05/2020.  Dose decreased to 30 mg daily on 10/14 with recommendation to decrease by 10 mg weekly until at home dose. -Dr. Lake Bells discussed with her outpatient rheumatologist who felt presentation consistent with vasculitis picture.  Unfortunately given her underlying acute infections cannot use more aggressive immunosuppression other than prednisone  -Trach placed on 10/4 would not be a candidate for SNF until 11/4.  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 Ephriam Jenkins with [facility]). - previously treated with rituximab >> last in November 2020 - defer further immunosuppressive therapy while she is being treated for acute infection -She will need close rheumatology follow-up after discharge.   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 Atrial flutter/atrial tachycardia with runs of NSVT - new this admission. - Advanced heart failure team following intermittently; initially she was in cardiogenic shock due to right ventricular failure/cor pulmonale -echo repeated -improved  with milrinone and diuresis-only on oral Lasix as of 10/11 -Had additional shock physiology secondary to sepsis from ESBL E. coli pneumonia antibiotics subsequently changed from cefepime to [facility] physiology has resolved and patient remains stable -Continue amiodarone 200 mg daily for suppression of RVR and NSVT; EF 35 to 40%-currently not on beta-blockers or ARB/ACE I -(-) 16,000  cc since admission; continue strict I's/O -Cardiology documented that a right heart cath would not change management at this point and they feel that she needs to be more stable; can pursue as outpatient -Continue Eliquis for stroke prophylaxis  Acute metabolic encephalopathy from hypoglycemia, hypotension, hypoxia. Suspected anoxic brain injury Seizure in setting of hypoxia with respiratory failure. -Currently alert and oriented -Noted patient with left upper extremity weakness which apparently was not present prior to admission therefore suspect some degree of anoxic brain injury -Continue Keppra- levetiracetam level 10/15 within therapeutic ranges at 27.4 -Brain MRI without evidence of vasculitis   Severe physical deconditioning -Secondary to above -Hopeful for LTAC placement although if continues to improve may be eligible to be evaluated by inpatient rehab -Continue PT and OT -Continue Prevalon boots  Pulmonary Embolism in June 2021. -Continue Eliquis  Hypernatremia (Resolved)/hyponatremia/hyperkalemia -Continue to follow labs periodically -K remains elevated- cont Lasix and follow labs daily  ?Liver cirrhosis/Abnormal LFTs, -LFTs normal now. -Continues to have significant diarrhea/watery stools therefore will check ammonia level to determine if lactulose dose needs to be adjusted or discontinued -Patient was not on Chronulac or lactulose prior to admission-given persistent electrolyte abnormalities and watery stools will decrease chronulac from 30g daily to 20 g-ammonia level on 10/18 stable at 37 therefore will decrease Chronulac further to 10 g daily and if ammonia level remains appropriate as of Thursday this week likely can discontinue Chronulac. -CT abdomen and pelvis 1 year ago without any evidence of cirrhosis -CTA that included abdomen during this admission revealed liver contour somewhat nodular and there may be a component of underlying cirrhosis. -Suspect this is  more reflective of passive hepatic congestion from patient's severe heart failure and presentation with acute systolic heart failure than of true cirrhosis  Steroid Induced Hyperglycemia. -Resolved -experiencing hypoglycemia w/ scheduled Novolog (using for tube feeding coverage) therefore was dc'd  -Continue SSI  Macrocytic Anemia -Stable. Monitor. -Iron normal at 52 with an elevated ferritin on 10/11  Chest Pain, rule out ACS;chest pain has completely resolved -Chest pain a few days ago which is now completely resolved -EKG was relatively unremarkable and nonischemic; -Troponin was initially elevated likely demand ischemia in the setting of her nausea vomiting and her current comorbidities  Thrombocytosis -Reactive and stable. She is already on anticoagulant.  Dysphagia/severe Protein Calorie Malnutrition. -On dysphagia 1 diet but not eating much so supplemented by tube feedings.  Patient does not like the taste or texture of pured foods -Hopefully with downsizing of trach patient will be able to tolerate swallowing better and can eventually have core track tube discontinued and avoid PEG tube placement -cont SLP   Bilateral Pressure Ulcers - Wound Consult - BID Dressing Change w/ silvadene     Wound / Incision (Open or Dehisced) 07/13/20 (MASD) Moisture Associated Skin Damage Buttocks Right;Left Skin excoriated on buttocks (Active)  Date First Assessed/Time First Assessed: 07/13/20 1200   Wound Type: (MASD) Moisture Associated Skin Damage  Location: Buttocks  Location Orientation: Right;Left  Wound Description (Comments): Skin excoriated on buttocks  Present on Admission: No    Assessments 07/13/2020  2:00 PM 07/25/2020  4:05 AM  Dressing  Type Foam - Lift dressing to assess site every shift --  Dressing Changed New --  Dressing Status Clean;Dry;Intact Clean;Dry;Intact  Dressing Change Frequency PRN --  Site / Wound Assessment Red --  Peri-wound Assessment Excoriated --   Margins Attached edges (approximated) --  Closure None --  Drainage Amount Scant --  Drainage Description No odor;Serous --  Non-staged Wound Description Not applicable --  Treatment Cleansed --     No Linked orders to display     Wound / Incision (Open or Dehisced) 07/14/20 (MASD) Moisture Associated Skin Damage Thigh Anterior;Right;Left (Active)  Date First Assessed/Time First Assessed: 07/14/20 2207   Wound Type: (MASD) Moisture Associated Skin Damage  Location: Thigh  Location Orientation: Anterior;Right;Left  Present on Admission: No    Assessments 07/14/2020 10:07 PM 07/25/2020  4:05 AM  Dressing Type None --  Dressing Status -- Clean;Dry;Intact  Site / Wound Assessment Pink --  Closure None --  Drainage Amount Minimal --  Drainage Description Serous --  Treatment Cleansed --     No Linked orders to display     Wound / Incision (Open or Dehisced) 07/19/20 Non-pressure wound Pretibial Distal;Left (Active)  Date First Assessed/Time First Assessed: 07/19/20 1100   Wound Type: Non-pressure wound  Location: Pretibial  Location Orientation: Distal;Left  Present on Admission: (c)     Assessments 07/20/2020 11:34 AM 07/25/2020  4:05 AM  Dressing Status -- Clean;Dry;Intact  Wound Length (cm) 3 cm --  Wound Width (cm) 3 cm --  Wound Surface Area (cm^2) 9 cm^2 --     No Linked orders to display     Wound / Incision (Open or Dehisced) 07/19/20 Non-pressure wound Pretibial Distal;Right (Active)  Date First Assessed/Time First Assessed: 07/19/20 1100   Wound Type: Non-pressure wound  Location: Pretibial  Location Orientation: Distal;Right  Present on Admission: (c)     Assessments 07/20/2020 11:33 AM 07/25/2020  4:05 AM  Dressing Type Non adherent;Gauze (Comment) --  Dressing Changed Changed --  Dressing Status Clean;Dry;Intact Clean;Dry;Intact  Dressing Change Frequency Daily --  Site / Wound Assessment Friable;Pink;Yellow --  Peri-wound Assessment Intact --  Margins  Unattached edges (unapproximated) --  Closure None --  Drainage Amount Minimal --  Drainage Description Purulent;No odor --     Procedures: 9/15 ETT >> 9/15 R IJ TL CVC >>9/27 9/27 PICC > 10/4 Trach 10/14 downsized to 4.0 cuffless trach  Consultations:  PCCM Transfer  Cardiology  Neurology  Discharge Exam: Vitals:   07/26/20 0743 07/26/20 0900  BP: 116/70 116/70  Pulse: 95   Resp: 17   Temp: 98.7 F (37.1 C)   SpO2: 100%    Constitutional: NAD, calm, comfortable  Respiratory: Posterior lungs soundscoarse in bases. Normal respiratory effort. No accessory muscle use. #4.0 cuffless trach 28% FIO2/5L Cardiovascular: Regular rate and rhythm, no murmurs / rubs / gallops. No extremity edema. 2+ pedal pulses.  Abdomen: no tenderness, no masses palpated. No hepatosplenomegaly. Bowel sounds positive.Cortrak tube in place for feedings and meds.  Has rectal tube in place with dark brown liquid stool in drainage bag. Musculoskeletal: no clubbing / cyanosis. No joint deformity upper and lower extremities. Good ROM, no contractures. Normal muscle tone.  Skin: Unremarkable except for areas of blackened skin at the distal tips of fingers of hands and on the toes which patient reports are painful to touch.  Has Prevalon boots in place so unable to adequately assess skin of feet, ankles or tibia region. Neurologic: CN 2-12 grossly intact. Sensation intact, DTR  not assessed strength 4/5 right upper extremity, bilateral lower extremities are extremely weak with patient unable to lift legs off the bed so strength is about 1-2/5.  Shoulder strength is 3/5 laterally.  Left upper extremity extremely weak especially distally with strength ~1/5. Psychiatric: Normal judgment and insight. Alert and oriented x 3 . Normal mood.   Discharge Instructions   Discharge Instructions    Diet - low sodium heart healthy   Complete by: As directed    Dysphagia 1 with thin liquids   Discharge wound care:    Complete by: As directed    Left buttock wound: Foam dressing to left buttock.  Peeled down all sites with a foam dressing each shift and record observations.  Change foam dressing every 3 days or as needed soiling  Left lower extremity wound: Cleanse wounds daily with soap and water pat dry cover with Mepitel Kellie Simmering [phone]), secured with Kerlix daily   Increase activity slowly   Complete by: As directed    Continue PT/OT/SLP     Allergies as of 07/26/2020   No Known Allergies     Medication List    STOP taking these medications   acetaminophen 500 MG tablet Commonly known as: TYLENOL Replaced by: acetaminophen 160 MG/5ML solution   albuterol 108 (90 Base) MCG/ACT inhaler Commonly known as: VENTOLIN HFA   aspirin EC 81 MG tablet   diltiazem 240 MG 24 hr capsule Commonly known as: CARDIZEM CD   diltiazem 30 MG tablet Commonly known as: CARDIZEM   doxycycline 100 MG tablet Commonly known as: VIBRA-TABS   OXYGEN   pantoprazole 40 MG tablet Commonly known as: PROTONIX Replaced by: pantoprazole sodium 40 mg/20 mL Pack   promethazine 25 MG tablet Commonly known as: PHENERGAN   traMADol 50 MG tablet Commonly known as: ULTRAM   Vitamin B-12 1000 MCG Subl     TAKE these medications   acetaminophen 160 MG/5ML solution Commonly known as: TYLENOL Place 20.3 mLs (650 mg total) into feeding tube every 6 (six) hours as needed for mild pain, headache or fever. Replaces: acetaminophen 500 MG tablet   amiodarone 200 MG tablet Commonly known as: PACERONE Place 1 tablet (200 mg total) into feeding tube daily.   apixaban 5 MG Tabs tablet Commonly known as: ELIQUIS Place 1 tablet (5 mg total) into feeding tube 2 (two) times daily. What changed: how to take this   CALCIUM 1200 PO Take 1 capsule by mouth daily.   chlorhexidine gluconate (MEDLINE KIT) 0.12 % solution Commonly known as: PERIDEX 15 mLs by Mouth Rinse route 2 (two) times daily.   diphenhydrAMINE-zinc  acetate cream Commonly known as: BENADRYL Apply topically 2 (two) times daily as needed for itching.   feeding supplement (OSMOLITE 1.5 CAL) Liqd Place 1,000 mLs into feeding tube continuous.   feeding supplement (PROSource TF) liquid Place 45 mLs into feeding tube daily.   fluticasone 50 MCG/ACT nasal spray Commonly known as: FLONASE Place 1 spray into both nostrils daily as needed for allergies.   furosemide 20 MG tablet Commonly known as: LASIX Place 1 tablet (20 mg total) into feeding tube daily. What changed:   how to take this  when to take this   gabapentin 300 MG capsule Commonly known as: NEURONTIN Take 300 mg by mouth 2 (two) times daily.   influenza vac split quadrivalent PF 0.5 ML injection Commonly known as: FLUARIX Inject 0.5 mLs into the muscle tomorrow at 10 am for 1 dose.   insulin aspart 100  UNIT/ML injection Commonly known as: novoLOG Inject 0-9 Units into the skin every 4 (four) hours. Correction coverage: Sensitive (thin, NPO, renal)  CBG < 70: Implement Hypoglycemia Standing Orders and refer to Hypoglycemia Standing Orders sidebar report  CBG 70 - 120: 0 units  CBG 121 - 150: 1 unit  CBG 151 - 200: 2 units  CBG 201 - 250: 3 units  CBG 251 - 300: 5 units  CBG 301 - 350: 7 units  CBG 351 - 400 9 units  CBG > 400 call MD and obtain STAT lab verification   ipratropium 0.02 % nebulizer solution Commonly known as: ATROVENT Take 2.5 mLs (0.5 mg total) by nebulization every 6 (six) hours as needed for wheezing or shortness of breath (give with xopenex).   lactulose 10 GM/15ML solution Commonly known as: CHRONULAC Place 15 mLs (10 g total) into feeding tube daily.   levalbuterol 0.63 MG/3ML nebulizer solution Commonly known as: XOPENEX Take 3 mLs (0.63 mg total) by nebulization every 6 (six) hours as needed for wheezing or shortness of breath (Give with atrovent).   levETIRAcetam 100 MG/ML solution Commonly known as: KEPPRA Place 5 mLs (500 mg  total) into feeding tube 2 (two) times daily.   melatonin 3 MG Tabs tablet Place 1 tablet (3 mg total) into feeding tube at bedtime as needed (sleep).   morphine 2 MG/ML injection Inject 1 mL (2 mg total) into the vein every 4 (four) hours as needed.   nitroGLYCERIN 0.4 MG SL tablet Commonly known as: NITROSTAT Place 1 tablet (0.4 mg total) under the tongue every 5 (five) minutes as needed for chest pain.   ondansetron 4 MG/2ML Soln injection Commonly known as: ZOFRAN Inject 2 mLs (4 mg total) into the vein every 6 (six) hours as needed for nausea or vomiting.   pantoprazole sodium 40 mg/20 mL Pack Commonly known as: PROTONIX Place 20 mLs (40 mg total) into feeding tube daily. Replaces: pantoprazole 40 MG tablet   polyethylene glycol 17 g packet Commonly known as: MIRALAX / GLYCOLAX Place 17 g into feeding tube daily as needed for moderate constipation.   predniSONE 10 MG tablet Commonly known as: DELTASONE Take 2 tablets (20 mg total) by mouth daily. What changed:   medication strength  how much to take  how to take this  when to take this  additional instructions   silver sulfADIAZINE 1 % cream Commonly known as: SILVADENE Apply topically 2 (two) times daily.   sodium chloride flush 0.9 % Soln Commonly known as: NS 10-40 mLs by Intracatheter route every 12 (twelve) hours.   sodium chloride flush 0.9 % Soln Commonly known as: NS 10-40 mLs by Intracatheter route as needed (flush).   Vitamin D 50 MCG (2000 UT) tablet Take 2,000 Units by mouth daily.   white petrolatum Oint Commonly known as: VASELINE Apply 1 application topically as needed for lip care.            Discharge Care Instructions  (From admission, onward)         Start     Ordered   07/26/20 0000  Discharge wound care:       Comments: Left buttock wound: Foam dressing to left buttock.  Peeled down all sites with a foam dressing each shift and record observations.  Change foam dressing  every 3 days or as needed soiling  Left lower extremity wound: Cleanse wounds daily with soap and water pat dry cover with Mepitel Kellie Simmering [phone]), secured with Kerlix  daily   07/26/20 1036         No Known Allergies    The results of significant diagnostics from this hospitalization (including imaging, microbiology, ancillary and laboratory) are listed below for reference.    Significant Diagnostic Studies: DG Chest 1 View  Result Date: 07/03/2020 CLINICAL DATA:  Cough. EXAM: CHEST  1 VIEW COMPARISON:  06/27/2020 CT 06/22/2020 FINDINGS: Tip of the enteric tube 5.4 cm from the carina. The enteric tube is in place with tip below the diaphragm not included in the field of view. Right internal jugular central line tip in the upper SVC. Small pleural effusions, left greater than right with slight improvement from prior exam. There is improvement in interstitial thickening. Probable fibrotic change in the lung bases. No pneumothorax or evidence of pneumomediastinum. Mild cardiomegaly is similar. IMPRESSION: 1. Improving interstitial thickening, likely improving pulmonary edema. 2. Small pleural effusions, left greater than right, with slight improvement from prior exam. 3. Support apparatus unchanged. Electronically Signed   By: [name] M.D.   On: 07/03/2020 15:23   CT HEAD WO CONTRAST  Result Date: 07/01/2020 CLINICAL DATA:  Neurologic deficit EXAM: CT HEAD WITHOUT CONTRAST TECHNIQUE: Contiguous axial images were obtained from the base of the skull through the vertex without intravenous contrast. COMPARISON:  MRI 06/25/2020, CT 06/22/2020 FINDINGS: Brain: No evidence of acute infarction, hemorrhage, hydrocephalus, extra-axial collection or mass lesion/mass effect. Mild cerebral volume loss. Vascular: No hyperdense vessel or unexpected calcification. Skull: Normal. Negative for fracture or focal lesion. Sinuses/Orbits: Unchanged partial bilateral mastoid opacification, right greater than  left. Paranasal sinuses are clear. Orbital structures unremarkable. Other: Fluid and debris within the posterior nasopharynx. IMPRESSION: 1. No acute intracranial findings. 2. Unchanged partial bilateral mastoid opacification, right greater than left. 3. Fluid and debris within the posterior nasopharynx. Electronically Signed   By: Davina Poke D.O.   On: 07/01/2020 10:55   MR BRAIN W WO CONTRAST  Addendum Date: 07/05/2020   ADDENDUM REPORT: 07/05/2020 17:47 ADDENDUM: These results were called by telephone at the time of interpretation on 07/05/2020 at 5:47 pm to provider Dr. Wilnette Kales, who verbally acknowledged these results. Electronically Signed   By: Kellie Simmering DO   On: 07/05/2020 17:47   Result Date: 07/05/2020 CLINICAL DATA:  Provided history: Mental status change, unknown cause. EXAM: MRI HEAD WITHOUT AND WITH CONTRAST TECHNIQUE: Multiplanar, multiecho pulse sequences of the brain and surrounding structures were obtained without and with intravenous contrast. CONTRAST:  6.51m GADAVIST GADOBUTROL 1 MMOL/ML IV SOLN COMPARISON:  Head CT 07/01/2020, brain MRI 06/25/2020. FINDINGS: Brain: Stable, mild generalized cerebral atrophy. New from the prior brain MRI of 06/25/2020, there is thin T2/FLAIR hyperintensity overlying the bilateral cerebral convexities measuring up to 3 mm in greatest thickness. On the postcontrast imaging, this appears to predominantly corresponding diffuse pachymeningeal dural thickening and enhancement. However, small bilateral subdural effusions are difficult to exclude. Also new from the prior MRI, there is a 9 mm focus of T1 hyperintensity along the right temporal occipital lobes, which may reflect a small focus of subacute extra-axial blood products (series 16, image 15). Stable mild multifocal T2/FLAIR hyperintensity within the cerebral white matter which is nonspecific. There is no acute infarct. No evidence of intracranial mass. No midline shift. Vascular: Expected proximal  arterial flow voids. Skull and upper cervical spine: No focal marrow lesion. Sinuses/Orbits: Visualized orbits show no acute finding. No significant paranasal sinus disease at the imaged levels. Bilateral mastoid effusions. IMPRESSION: New from the brain MRI of 06/25/2020,  there is T2/FLAIR hyperintense signal abnormality overlying the bilateral cerebral convexities measuring up to 3 mm in greatest thickness. On the postcontrast imaging, this appears to predominantly correspond with diffuse pachymeningeal dural thickening and enhancement. However, small bilateral subdural effusions are difficult to exclude. Diffuse pachymeningeal dural thickening and enhancement is a nonspecific finding with broad differential considerations, but is most commonly related to recent lumbar puncture or craniospinal hypotension. Clinical correlation is recommended. Subcentimeter extra-axial focus of T1 hyperintensity along the right temporal occipital lobe, new as compared to the prior MRI. This may reflect a small focus of subacute blood products. Unchanged mild generalized cerebral atrophy and mild nonspecific cerebral white matter disease. Electronically Signed: By: Kellie Simmering DO On: 07/05/2020 17:25   DG CHEST PORT 1 VIEW  Result Date: 07/22/2020 CLINICAL DATA:  Tracheostomy present.  Difficulty breathing EXAM: PORTABLE CHEST 1 VIEW COMPARISON:  July 20, 2020 and July 04, 2020 FINDINGS: Tracheostomy catheter tip is 5.3 cm above carina. Enteric tube tip is below the diaphragm. Central catheter tip is in the superior vena cava near the cavoatrial junction. No pneumothorax. There is stable interstitial thickening mid lower lung regions, stable. No consolidation or airspace opacity. No pleural effusions. Heart is mildly enlarged, stable. Pulmonary vascularity is normal. No adenopathy. No bone lesions. IMPRESSION: Tube and catheter positions as described without pneumothorax. Probable fibrotic change in the lower lung  regions. No frank edema or consolidation. Stable cardiac prominence. Electronically Signed   By: Lowella Grip III M.D.   On: 07/22/2020 12:18   DG CHEST PORT 1 VIEW  Result Date: 07/20/2020 CLINICAL DATA:  Tracheostomy. EXAM: PORTABLE CHEST 1 VIEW COMPARISON:  July 18, 2020. FINDINGS: Stable cardiomegaly. Tracheostomy and feeding tubes are unchanged in position. Right-sided PICC line is unchanged. No pneumothorax is noted. Minimal bibasilar subsegmental atelectasis is noted. No significant pleural effusion is noted. Bony thorax is unremarkable. IMPRESSION: Stable support apparatus. Minimal bibasilar subsegmental atelectasis. Electronically Signed   By: Marijo Conception M.D.   On: 07/20/2020 08:36   DG CHEST PORT 1 VIEW  Result Date: 07/18/2020 CLINICAL DATA:  Shortness of breath. Pneumonia. Additional history provided by technologist: Tracheostomy, pneumonia, respiratory arrest. EXAM: PORTABLE CHEST 1 VIEW COMPARISON:  Prior chest radiographs 07/17/2020 and earlier. FINDINGS: Unchanged position of a tracheostomy tube and right-sided PICC. An enteric tube passes below level of left hemidiaphragm with tip excluded from the field of view. Unchanged cardiomegaly. Subtle new patchy airspace disease is questioned within the left mid to upper lung field. Stable background chronic interstitial prominence. No definite pleural effusion. No evidence of pneumothorax. No acute bony abnormality identified. IMPRESSION: Stable position of visible support apparatus as described. Subtle new patchy airspace disease is questioned within the left mid to upper lung field. Consider short interval radiographic follow-up. Unchanged cardiomegaly. Stable background chronic prominence of the interstitial lung markings. Electronically Signed   By: Kellie Simmering DO   On: 07/18/2020 08:18   DG CHEST PORT 1 VIEW  Result Date: 07/17/2020 CLINICAL DATA:  Shortness of breath. EXAM: PORTABLE CHEST 1 VIEW COMPARISON:  One-view  chest x-ray 07/16/2020 FINDINGS: Right-sided PICC line is stable. Feeding tube courses off the inferior border the film. Tracheostomy tube is in place. The heart is enlarged. Diffuse interstitial pattern is stable, likely chronic. No significant airspace consolidation is present. IMPRESSION: 1. Stable cardiomegaly without failure. 2. Stable chronic interstitial pattern. 3. Support apparatus is stable. Electronically Signed   By: San Morelle M.D.   On: 07/17/2020 12:21  DG CHEST PORT 1 VIEW  Result Date: 07/16/2020 CLINICAL DATA:  Respiratory failure. History of rheumatoid lung disease. EXAM: PORTABLE CHEST 1 VIEW COMPARISON:  07/15/2020; 07/14/2020; 07/12/2020 FINDINGS: Grossly unchanged enlarged cardiac silhouette. Normal mediastinal contours. Stable positioning of support apparatus. Improved aeration of the lungs with persistent bilateral slightly nodular interstitial opacities with basilar predominance. No pleural effusion or pneumothorax. No new focal airspace opacities. No acute osseous abnormalities. IMPRESSION: 1. Stable position of support apparatus.  No pneumothorax. 2. Improved aeration of the lungs with minimal residual bilateral interstitial opacities with basilar predominance nonspecific with differential considerations including asymmetric pulmonary edema versus pulmonary fibrosis. Electronically Signed   By: Sandi Mariscal M.D.   On: 07/16/2020 14:17   DG CHEST PORT 1 VIEW  Result Date: 07/15/2020 CLINICAL DATA:  Shortness of breath. EXAM: PORTABLE CHEST 1 VIEW COMPARISON:  07/14/2020.  07/12/2020.  06/23/2020. FINDINGS: Tracheostomy tube, feeding tube, right PICC line in stable position. Stable cardiomegaly. Diffuse severe bilateral interstitial prominence again noted. Interim slight increase interstitial markings noted on today's exam suggesting a component of interstitial edema. Tiny left pleural effusion cannot be excluded on today's exam. No pneumothorax. IMPRESSION: 1. Lines and  tubes in stable position. 2. Stable cardiomegaly. 3. Diffuse severe bilateral interstitial prominence again noted. Interim slight increase in interstitial markings noted on today's exam suggesting the possibility of a component of interstitial edema. Tiny left pleural effusion cannot be excluded on today's exam. Electronically Signed   By: Marcello Moores  Register   On: 07/15/2020 07:27   DG CHEST PORT 1 VIEW  Result Date: 07/14/2020 CLINICAL DATA:  Shortness of breath. EXAM: PORTABLE CHEST 1 VIEW COMPARISON:  Chest x-ray dated July 12, 2020. FINDINGS: Unchanged tracheostomy tube, feeding tube, and right upper extremity PICC line. Stable cardiomegaly. Unchanged diffuse interstitial coarsening. No focal consolidation, pleural effusion, or pneumothorax. No acute osseous abnormality. IMPRESSION: 1. Unchanged chronic interstitial lung disease. Electronically Signed   By: Titus Dubin M.D.   On: 07/14/2020 11:42   DG Chest Port 1 View  Result Date: 07/12/2020 CLINICAL DATA:  Respiratory failure. EXAM: PORTABLE CHEST 1 VIEW COMPARISON:  07/11/2020 FINDINGS: 0545 hours. Tracheostomy tube again noted in situ. A feeding tube passes into the stomach although the distal tip position is not included on the film. Lungs are hyperexpanded. Interstitial markings are diffusely coarsened with chronic features. Right PICC line tip overlies the distal SVC. Telemetry leads overlie the chest. IMPRESSION: 1. Stable exam. Hyperexpansion with chronic interstitial coarsening. No new or progressive findings. 2. Support apparatus as described. Electronically Signed   By: Misty Stanley M.D.   On: 07/12/2020 07:33   DG CHEST PORT 1 VIEW  Result Date: 07/11/2020 CLINICAL DATA:  Post tracheal plasty EXAM: PORTABLE CHEST 1 VIEW COMPARISON:  Portable exam 1523 hours compared to 07/10/2020 FINDINGS: New tracheostomy tube with tip projecting over tracheal air column 6.8 cm above carina. Feeding tube extends into stomach. RIGHT arm PICC line  tip projecting over SVC. Upper normal size of cardiac silhouette. Mediastinal contours normal. Persistent interstitial prominence throughout both lungs slightly greater on RIGHT which could represent chronic interstitial disease, atypical infection or edema unchanged. No pleural effusion or pneumothorax. IMPRESSION: New tracheostomy tube. Chronic interstitial thickening throughout both lungs question chronic interstitial disease though cannot completely exclude atypical infection and pulmonary edema. Electronically Signed   By: Lavonia Dana M.D.   On: 07/11/2020 15:41   DG Chest Port 1 View  Result Date: 07/10/2020 CLINICAL DATA:  Intubated EXAM: PORTABLE CHEST 1 VIEW  COMPARISON:  07/08/2020 chest radiograph. FINDINGS: Endotracheal tube tip is 4.9 cm above the carina. Enteric tube enters stomach with the tip not seen on this image. Right PICC terminates over the lower third of the SVC. Stable cardiomediastinal silhouette with mild cardiomegaly. No pneumothorax. No pleural effusion. No overt pulmonary edema. Scattered reticular opacities in both lungs, unchanged. IMPRESSION: 1. Well-positioned support structures. 2. Stable mild cardiomegaly without overt pulmonary edema. 3. Stable scattered reticular opacities in both lungs, favor scarring or atelectasis. Electronically Signed   By: Ilona Sorrel M.D.   On: 07/10/2020 07:25   DG Chest Port 1 View  Result Date: 07/08/2020 CLINICAL DATA:  Acute respiratory failure with hypoxia EXAM: PORTABLE CHEST 1 VIEW COMPARISON:  07/07/2020 FINDINGS: Endotracheal tube, enteric tube, and right PICC line are unchanged in position. Cardiac enlargement. Bilateral interstitial infiltrates, similar to prior study. No pleural effusions. No pneumothorax. IMPRESSION: Bilateral interstitial  infiltrates, similar to prior study. Electronically Signed   By: Lucienne Capers M.D.   On: 07/08/2020 05:50   DG Chest Port 1 View  Result Date: 07/07/2020 CLINICAL DATA:  Acute respiratory  failure.  Hypoxia. EXAM: PORTABLE CHEST 1 VIEW COMPARISON:  07/04/2020.  CT 06/22/2020. FINDINGS: Endotracheal tube, NG tube, right PICC line in stable position. Stable cardiomegaly. No pulmonary venous congestion. Unchanged bilateral interstitial infiltrates/edema no pleural effusion or pneumothorax. IMPRESSION: 1. Lines and tubes in stable position. 2. Stable cardiomegaly. 3. Unchanged bilateral interstitial infiltrates/edema. Electronically Signed   By: Marcello Moores  Register   On: 07/07/2020 05:43   DG Chest Port 1 View  Result Date: 07/04/2020 CLINICAL DATA:  59 year old female with COVID-19. EXAM: PORTABLE CHEST 1 VIEW COMPARISON:  Portable chest 07/03/2020 and earlier, including chest CTA 06/22/2020 FINDINGS: Portable AP semi upright view at 1005 hours. Stable lines and tubes. Mildly larger lung volumes. Stable mediastinal contours including cardiomegaly. Substantially regressed coarse bilateral pulmonary interstitial opacity since 06/22/2020, ventilation stable since yesterday. Largely resolved bilateral pleural effusions. No pneumothorax, consolidation, or area of worsening ventilation. No acute osseous abnormality identified. Negative visible bowel gas pattern. IMPRESSION: 1. Stable lines and tubes. 2. Stable ventilation since yesterday. Substantially improved since 06/22/2020. 3. No new cardiopulmonary abnormality. Electronically Signed   By: Genevie Ann M.D.   On: 07/04/2020 10:16   DG CHEST PORT 1 VIEW  Result Date: 06/27/2020 CLINICAL DATA:  Respiratory arrest. EXAM: PORTABLE CHEST 1 VIEW COMPARISON:  June 23, 2020. FINDINGS: Stable cardiomediastinal silhouette. Endotracheal and nasogastric tubes are unchanged in position. Right internal jugular catheter is unchanged. No pneumothorax is noted. Interstitial densities are noted in both lung bases concerning for pulmonary edema with small pleural effusions. Bony thorax is unremarkable. IMPRESSION: Stable support apparatus. Stable bibasilar interstitial  densities are noted concerning for pulmonary edema with small pleural effusions. Electronically Signed   By: Marijo Conception M.D.   On: 06/27/2020 08:37   DG Swallowing Func-Speech Pathology  Result Date: 07/19/2020 Objective Swallowing Evaluation: Type of Study: MBS-Modified Barium Swallow Study  Patient Details Name: Carolyn Proctor MRN: [id number] Date of Birth: 01/26/1961 Today's Date: 07/19/2020 Time: SLP Start Time (ACUTE ONLY): 1020 -SLP Stop Time (ACUTE ONLY): 1045 SLP Time Calculation (min) (ACUTE ONLY): 25 min Past Medical History: Past Medical History: Diagnosis Date . Acute myopericarditis 02/24/2013 . Atrial fibrillation ([place])  . Atrial fibrillation with RVR ([place]) 02/22/2013 . Chronic anticoagulation, Xarelto 04/02/2013 . Chronic diastolic CHF (congestive heart failure) ([place]) 06/26/2019 . Diverticulitis  . Elevated troponin- secondary to Myopericarditis 02/25/2013 . Emphysema  07/07/2019 . Hypertension  .  Lupus ([place]) 09/14/2011 . 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 PAF (paroxysmal atrial fibrillation) ([place]) 04/02/2013 . Pleuritic chest pain 02/22/2013 . 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 Rheumatoid arthritis ([place]) 07/07/2019 . 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 Right ventricular dysfunction 08/05/2019 . Thrombocytosis ([place]) 09/14/2011 Past Surgical History: Past Surgical History: Procedure Laterality Date . BONE MARROW BIOPSY  2010 . CARDIAC CATHETERIZATION   . LEFT HEART CATHETERIZATION WITH CORONARY ANGIOGRAM N/A 02/24/2013  Procedure: LEFT HEART CATHETERIZATION WITH CORONARY ANGIOGRAM;  Surgeon: Leonie Man, MD;  Location: [facility] CATH LAB;  Service: Cardiovascular;  Laterality: N/A; . RIGHT/LEFT HEART CATH AND CORONARY ANGIOGRAPHY N/A 09/09/2019  Procedure: RIGHT/LEFT HEART CATH AND CORONARY ANGIOGRAPHY;  Surgeon: Jettie Booze, MD;  Location: [facility] CV LAB;  Service: Cardiovascular;  Laterality: N/A; HPI:  Patient is a 59 year old female. She presented to the hospital with reports of stroke like, left facial droop and unresponsiveness. She has had a complicated hospital stay which included intubation from 9/15-10/4 with trach placed on 10/4.  PMH: lupus, a-fib, CHF, tachycardia, thrombocytosis, right ventricular dysfunction, rheumatoid lung disease, RA. CT hyead and MRI 9/15 and 9/18 were negative for acute changes. MRI brain 9/28: bilateral cerebral convexities measuring up to 3 mm in greatest thickness. Appears to predominantly correspond with diffuse pachymeningeal dural thickening and enhancement. However, small bilateral subdural effusions difficult to exclude.  Subjective: Pt was lethargic with daughter at bedside Assessment / Plan / Recommendation CHL IP CLINICAL IMPRESSIONS 07/19/2020 Clinical Impression Pt was seen for MBS, which revealed moderate oropharyngeal dysphagia as characterized by delayed oral transit, premature spillage, delayed swallow initiation, and moderate pharyngeal residue. Pt wore PMV during instrumental and tolerated it well, however, her vocal quality is dysphonic and low in volume. Delayed oral transit occurred across POs but most significantly with solids. She also demonstrated some lingual pumping with solids. Premature spillage fell to the level of at least the valleculae across POs, and to the level of the pyriforms with thin liquid. A delayed swallow initiation was observed across POs, but most significantly with solids. She demonstrated one instance of trace aspiration with thin liquid. No other instances of aspiration were observed, even with 3 oz of thin liquid. Moderate residue in the valleculae and pyriforms was observed across POs. When prompted to produce an additional dry swallow after the PO, she successfully cleared most of the residue. SLP also attempted a chin tuck strategy with patient and was successful  in reducing some residue. Recommend dys 1 diet and thin liquid with use of chin tuck and double swallow. SLP will f/u acutely for pt education of strategies and diet tolerance/advancement.  SLP Visit Diagnosis Dysphagia, oropharyngeal phase (R13.12) Attention and concentration deficit following -- Frontal lobe and executive function deficit following -- Impact on safety and function Moderate aspiration risk   CHL IP TREATMENT RECOMMENDATION 07/19/2020 Treatment Recommendations Therapy as outlined in treatment plan below   Prognosis 07/19/2020 Prognosis for Safe Diet Advancement Fair Barriers to Reach Goals Severity of deficits Barriers/Prognosis Comment -- CHL IP DIET RECOMMENDATION 07/19/2020 SLP Diet Recommendations Dysphagia 1 (Puree) solids;Thin liquid Liquid Administration via Straw Medication Administration Crushed with puree Compensations Chin tuck;Multiple dry swallows after each bite/sip;Slow rate;Small sips/bites Postural Changes Seated upright at 90 degrees   CHL IP OTHER RECOMMENDATIONS 07/19/2020 Recommended Consults -- Oral Care Recommendations Oral care BID  Other Recommendations --   CHL IP FOLLOW UP RECOMMENDATIONS 07/19/2020 Follow up Recommendations LTACH   CHL IP FREQUENCY AND DURATION 07/19/2020 Speech Therapy Frequency (ACUTE ONLY) min 2x/week Treatment Duration 2 weeks      CHL IP ORAL PHASE 07/19/2020 Oral Phase Impaired Oral - Pudding Teaspoon -- Oral - Pudding Cup -- Oral - Honey Teaspoon -- Oral - Honey Cup -- Oral - Nectar Teaspoon -- Oral - Nectar Cup -- Oral - Nectar Straw Premature spillage;Delayed oral transit Oral - Thin Teaspoon -- Oral - Thin Cup -- Oral - Thin Straw Premature spillage;Delayed oral transit Oral - Puree Premature spillage;Delayed oral transit Oral - Mech Soft -- Oral - Regular Premature spillage;Delayed oral transit;Lingual pumping Oral - Multi-Consistency -- Oral - Pill -- Oral Phase - Comment --  CHL IP PHARYNGEAL PHASE 07/19/2020 Pharyngeal Phase Impaired  Pharyngeal- Pudding Teaspoon -- Pharyngeal -- Pharyngeal- Pudding Cup -- Pharyngeal -- Pharyngeal- Honey Teaspoon -- Pharyngeal -- Pharyngeal- Honey Cup -- Pharyngeal -- Pharyngeal- Nectar Teaspoon -- Pharyngeal -- Pharyngeal- Nectar Cup -- Pharyngeal -- Pharyngeal- Nectar Straw Delayed swallow initiation-vallecula;Pharyngeal residue - valleculae;Pharyngeal residue - pyriform Pharyngeal -- Pharyngeal- Thin Teaspoon -- Pharyngeal -- Pharyngeal- Thin Cup -- Pharyngeal -- Pharyngeal- Thin Straw Delayed swallow initiation-vallecula;Delayed swallow initiation-pyriform sinuses;Pharyngeal residue - valleculae;Pharyngeal residue - pyriform;Trace aspiration;Compensatory strategies attempted (with notebox) Pharyngeal -- Pharyngeal- Puree Delayed swallow initiation-vallecula;Pharyngeal residue - valleculae;Pharyngeal residue - pyriform;Compensatory strategies attempted (with notebox) Pharyngeal -- Pharyngeal- Mechanical Soft -- Pharyngeal -- Pharyngeal- Regular Pharyngeal residue - pyriform;Pharyngeal residue - valleculae;Delayed swallow initiation-vallecula;Compensatory strategies attempted (with notebox) Pharyngeal -- Pharyngeal- Multi-consistency -- Pharyngeal -- Pharyngeal- Pill -- Pharyngeal -- Pharyngeal Comment --  No flowsheet data found. [name], MA CCC-SLP Acute Rehabilitation Services Pager [phone] Office [phone] Lynann Beaver 07/19/2020, 12:18 PM              EEG adult  Result Date: 07/03/2020 [name], MD     07/03/2020  8:23 PM Patient Name: Carolyn Proctor MRN: [id number] Epilepsy Attending: [name] Referring Physician/Provider: Posey Pronto, PA Date: 07/03/2020 Duration: 28.42 mins Patient history: 36o F with ams. EEG to evaluate for seizure. Level of alertness: comatose AEDs during EEG study: LEV Technical aspects: This EEG study was done with scalp electrodes positioned according to the 10-20 International system of electrode placement. Electrical  activity was acquired at a sampling rate of '500Hz'  and reviewed with a high frequency filter of '70Hz'  and a low frequency filter of '1Hz' . EEG data were recorded continuously and digitally stored. Description: EEG showed continuous generalized at times sharply contoured 6-'8hz'  theta-alpha activity as well as brief periods of generalized low amplitude 13-'15hz'  beta activity.  Hyperventilation and photic stimulation were not performed.   ABNORMALITY -Continuous slow, generalized IMPRESSION: This study is suggestive of severe diffuse encephalopathy, nonspecific etiology. No seizures or epileptiform discharges were seen throughout the recording. Priyanka O Yadav   [place] EKG SITE RITE  Result Date: 07/04/2020 If [facility] image not attached, placement could not be confirmed due to current cardiac rhythm.   Microbiology: No results found for this or any previous visit (from the past 240 hour(s)).   Labs: Basic Metabolic Panel: Recent Labs  Lab 07/19/20 1117 07/19/20 1117 07/20/20 0408 07/20/20 0408 07/22/20 0500 07/23/20 0500 07/24/20 0446 07/25/20 0437 07/26/20 0500  NA 133*   < > 132*   < > 132* 133* 131* 133* 132*  K 5.0   < > 5.2*   < > 5.4* 5.5* 5.2* 5.3* 4.9  CL 100   < > 99   < > 99 99 98 98 97*  CO2 25   < > 26   < > '24 25 25 25 27  ' GLUCOSE 99   < > 91   < > 131* 145* 139* 159* 127*  BUN 41*   < > 41*   < > 42* 45* 46* 46* 44*  CREATININE 1.04*   < > 1.16*   < > 1.10* 1.16* 1.12* 1.11* 1.09*  CALCIUM 7.7*   < > 8.2*   < > 8.6* 8.7* 8.7* 8.7* 8.7*  MG 1.9  --  2.0  --   --   --   --   --   --   PHOS 3.1  --  3.1  --   --   --   --   --   --    < > = values in this interval not displayed.   Liver Function Tests: Recent Labs  Lab 07/19/20 1117 07/20/20 0408 07/22/20 0500  AST 33 30 40  ALT 37 35 48*  ALKPHOS 213* 187* 268*  BILITOT 0.7 0.9 0.7  PROT 5.4* 5.5* 6.0*  ALBUMIN 1.6* 1.6* 1.9*   No results for input(s): LIPASE, AMYLASE in the last 168 hours. Recent Labs  Lab  07/22/20 0500 07/25/20 0438  AMMONIA 39* 37*   CBC: Recent Labs  Lab 07/20/20 0408 07/21/20 0440 07/22/20 0500  WBC 17.5* 17.2* 16.2*  NEUTROABS 12.6*  --   --   HGB 10.1* 10.9* 11.6*  HCT 33.0* 34.9* 37.9  MCV 106.1* 107.1* 106.8*  PLT 531* 591* 643*   Cardiac Enzymes: No results for input(s): CKTOTAL, CKMB, CKMBINDEX, TROPONINI in the last 168 hours. BNP: BNP (last 3 results) No results for input(s): BNP in the last 8760 hours.  ProBNP (last 3 results) No results for input(s): PROBNP in the last 8760 hours.  CBG: Recent Labs  Lab 07/25/20 1840 07/25/20 1948 07/25/20 2352 07/26/20 0354 07/26/20 0755  GLUCAP 169* 231* 128* 86 133*       Signed:  Erin Hearing ANP [facility] 07/26/2020, 10:40 AM

## 2020-07-27 ENCOUNTER — Other Ambulatory Visit (HOSPITAL_COMMUNITY): Payer: BC Managed Care – PPO

## 2020-07-27 LAB — PHOSPHORUS: Phosphorus: 4.8 mg/dL — ABNORMAL HIGH (ref 2.5–4.6)

## 2020-07-27 LAB — COMPREHENSIVE METABOLIC PANEL
ALT: 68 U/L — ABNORMAL HIGH (ref 0–44)
AST: 46 U/L — ABNORMAL HIGH (ref 15–41)
Albumin: 2.5 g/dL — ABNORMAL LOW (ref 3.5–5.0)
Alkaline Phosphatase: 270 U/L — ABNORMAL HIGH (ref 38–126)
Anion gap: 9 (ref 5–15)
BUN: 44 mg/dL — ABNORMAL HIGH (ref 6–20)
CO2: 25 mmol/L (ref 22–32)
Calcium: 8.9 mg/dL (ref 8.9–10.3)
Chloride: 98 mmol/L (ref 98–111)
Creatinine, Ser: 1 mg/dL (ref 0.44–1.00)
GFR, Estimated: >60 mL/min (ref >60)
Glucose, Bld: 146 mg/dL — ABNORMAL HIGH (ref 70–99)
Potassium: 5 mmol/L (ref 3.5–5.1)
Sodium: 132 mmol/L — ABNORMAL LOW (ref 135–145)
Total Bilirubin: 1.1 mg/dL (ref 0.3–1.2)
Total Protein: 6.7 g/dL (ref 6.5–8.1)

## 2020-07-27 LAB — CBC WITH DIFFERENTIAL/PLATELET
Abs Immature Granulocytes: 1.38 10*3/uL — ABNORMAL HIGH (ref 0.00–0.07)
Basophils Absolute: 0 10*3/uL (ref 0.0–0.1)
Basophils Relative: 0 %
Eosinophils Absolute: 0 10*3/uL (ref 0.0–0.5)
Eosinophils Relative: 0 %
HCT: 40.7 % (ref 36.0–46.0)
Hemoglobin: 13.1 g/dL (ref 12.0–15.0)
Immature Granulocytes: 9 %
Lymphocytes Relative: 5 %
Lymphs Abs: 0.8 10*3/uL (ref 0.7–4.0)
MCH: 34.6 pg — ABNORMAL HIGH (ref 26.0–34.0)
MCHC: 32.2 g/dL (ref 30.0–36.0)
MCV: 107.4 fL — ABNORMAL HIGH (ref 80.0–100.0)
Monocytes Absolute: 1.1 10*3/uL — ABNORMAL HIGH (ref 0.1–1.0)
Monocytes Relative: 8 %
Neutro Abs: 11.7 10*3/uL — ABNORMAL HIGH (ref 1.7–7.7)
Neutrophils Relative %: 78 %
Platelets: 621 10*3/uL — ABNORMAL HIGH (ref 150–400)
RBC: 3.79 MIL/uL — ABNORMAL LOW (ref 3.87–5.11)
RDW: 21.7 % — ABNORMAL HIGH (ref 11.5–15.5)
WBC: 15 10*3/uL — ABNORMAL HIGH (ref 4.0–10.5)
nRBC: 0.3 % — ABNORMAL HIGH (ref 0.0–0.2)

## 2020-07-27 LAB — ECHOCARDIOGRAM COMPLETE
Area-P 1/2: 5.75 cm2
S' Lateral: 2 cm
Single Plane A4C EF: 47.7 %

## 2020-07-27 LAB — T4, FREE: Free T4: 1.07 ng/dL (ref 0.61–1.12)

## 2020-07-27 LAB — TSH: TSH: 5.251 u[IU]/mL — ABNORMAL HIGH (ref 0.350–4.500)

## 2020-07-27 LAB — PROTIME-INR
INR: 1.8 — ABNORMAL HIGH (ref 0.8–1.2)
Prothrombin Time: 19.9 seconds — ABNORMAL HIGH (ref 11.4–15.2)

## 2020-07-27 LAB — MAGNESIUM: Magnesium: 2.1 mg/dL (ref 1.7–2.4)

## 2020-07-27 NOTE — Progress Notes (Signed)
  Echocardiogram 2D Echocardiogram has been performed.  Carolyn Proctor 07/27/2020, 3:31 PM

## 2020-07-28 ENCOUNTER — Encounter: Payer: Self-pay | Admitting: Internal Medicine

## 2020-07-28 DIAGNOSIS — G931 Anoxic brain damage, not elsewhere classified: Secondary | ICD-10-CM | POA: Diagnosis not present

## 2020-07-28 DIAGNOSIS — I272 Pulmonary hypertension, unspecified: Secondary | ICD-10-CM | POA: Diagnosis not present

## 2020-07-28 DIAGNOSIS — I5032 Chronic diastolic (congestive) heart failure: Secondary | ICD-10-CM

## 2020-07-28 DIAGNOSIS — J9621 Acute and chronic respiratory failure with hypoxia: Secondary | ICD-10-CM | POA: Diagnosis not present

## 2020-07-28 DIAGNOSIS — I2781 Cor pulmonale (chronic): Secondary | ICD-10-CM | POA: Diagnosis present

## 2020-07-28 DIAGNOSIS — J8410 Pulmonary fibrosis, unspecified: Secondary | ICD-10-CM | POA: Diagnosis present

## 2020-07-28 LAB — RENAL FUNCTION PANEL
Albumin: 2.3 g/dL — ABNORMAL LOW (ref 3.5–5.0)
Anion gap: 9 (ref 5–15)
BUN: 46 mg/dL — ABNORMAL HIGH (ref 6–20)
CO2: 24 mmol/L (ref 22–32)
Calcium: 8.8 mg/dL — ABNORMAL LOW (ref 8.9–10.3)
Chloride: 98 mmol/L (ref 98–111)
Creatinine, Ser: 1.01 mg/dL — ABNORMAL HIGH (ref 0.44–1.00)
GFR, Estimated: >60 mL/min (ref >60)
Glucose, Bld: 107 mg/dL — ABNORMAL HIGH (ref 70–99)
Phosphorus: 4.1 mg/dL (ref 2.5–4.6)
Potassium: 4.9 mmol/L (ref 3.5–5.1)
Sodium: 131 mmol/L — ABNORMAL LOW (ref 135–145)

## 2020-07-28 LAB — PROTIME-INR
INR: 1.8 — ABNORMAL HIGH (ref 0.8–1.2)
Prothrombin Time: 19.9 seconds — ABNORMAL HIGH (ref 11.4–15.2)

## 2020-07-28 LAB — CBC
HCT: 36.2 % (ref 36.0–46.0)
Hemoglobin: 11.4 g/dL — ABNORMAL LOW (ref 12.0–15.0)
MCH: 34.1 pg — ABNORMAL HIGH (ref 26.0–34.0)
MCHC: 31.5 g/dL (ref 30.0–36.0)
MCV: 108.4 fL — ABNORMAL HIGH (ref 80.0–100.0)
Platelets: 531 10*3/uL — ABNORMAL HIGH (ref 150–400)
RBC: 3.34 MIL/uL — ABNORMAL LOW (ref 3.87–5.11)
RDW: 21.8 % — ABNORMAL HIGH (ref 11.5–15.5)
WBC: 14.4 10*3/uL — ABNORMAL HIGH (ref 4.0–10.5)
nRBC: 0.3 % — ABNORMAL HIGH (ref 0.0–0.2)

## 2020-07-28 LAB — TSH: TSH: 6.212 u[IU]/mL — ABNORMAL HIGH (ref 0.350–4.500)

## 2020-07-28 LAB — HEMOGLOBIN A1C
Hgb A1c MFr Bld: 5.6 % (ref 4.8–5.6)
Mean Plasma Glucose: 114 mg/dL

## 2020-07-28 LAB — T4, FREE: Free T4: 1.26 ng/dL — ABNORMAL HIGH (ref 0.61–1.12)

## 2020-07-28 NOTE — Consult Note (Signed)
Referring Physician: Dr. Myrtis Hopping is an 59 y.o. female.                       Chief Complaint: Atrial fibrillation and cardiogenic shock history  HPI: 59 years old black female with PMH of atrial fibrillation, cardiogenic shock, systolic and diastolic left heart failure, pulmonary fibrosis and hypertension, myopericarditis, Lupus, HTN, Emphysema and rheumatoid arthritis has acute on chronic respiratory failure. She is also treated with high dose steroids. She is now on T collar and 28 % FiO2.  Past Medical History:  Diagnosis Date  . Acute myopericarditis 02/24/2013  . Acute on chronic respiratory failure with hypoxia ([place])   . Anoxic brain injury ([place])   . Atrial fibrillation ([place])   . Atrial fibrillation with RVR ([place]) 02/22/2013  . Chronic anticoagulation, Xarelto 04/02/2013  . Chronic diastolic CHF (congestive heart failure) ([place]) 06/26/2019  . Chronic pulmonary hypertension ([place])   . Cor pulmonale (chronic) ([place])   . Diverticulitis   . Elevated troponin- secondary to Myopericarditis 02/25/2013  . Emphysema  07/07/2019  . Hypertension   . Lupus ([place]) 09/14/2011  . 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 PAF (paroxysmal atrial fibrillation) ([place]) 04/02/2013  . Pleuritic chest pain 02/22/2013  . Pulmonary fibrosis ([place])   . 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 Rheumatoid arthritis ([place]) 07/07/2019  . Rheumatoid lung disease ([place]) 06/23/2019  . Right ventricular dysfunction 08/05/2019  . Thrombocytosis 09/14/2011      Past Surgical History:  Procedure Laterality Date  . BONE MARROW BIOPSY  2010  . CARDIAC CATHETERIZATION    . LEFT HEART CATHETERIZATION WITH CORONARY ANGIOGRAM N/A 02/24/2013   Procedure: LEFT HEART CATHETERIZATION WITH CORONARY ANGIOGRAM;  Surgeon: Leonie Man, MD;  Location: [facility] CATH LAB;  Service:  Cardiovascular;  Laterality: N/A;  . RIGHT/LEFT HEART CATH AND CORONARY ANGIOGRAPHY N/A 09/09/2019   Procedure: RIGHT/LEFT HEART CATH AND CORONARY ANGIOGRAPHY;  Surgeon: Jettie Booze, MD;  Location: [facility] CV LAB;  Service: Cardiovascular;  Laterality: N/A;    Family History  Problem Relation Age of Onset  . Lupus Mother   . Liver disease Mother   . Hypertension Maternal Grandmother   . Lupus Brother    Social History:  reports that she has never smoked. She has never used smokeless tobacco. She reports that she does not drink alcohol and does not use drugs.  Allergies: No Known Allergies  Medications Prior to Admission  Medication Sig Dispense Refill  . acetaminophen (TYLENOL) 160 MG/5ML solution Place 20.3 mLs (650 mg total) into feeding tube every 6 (six) hours as needed for mild pain, headache or fever. 120 mL 0  . amiodarone (PACERONE) 200 MG tablet Place 1 tablet (200 mg total) into feeding tube daily.    Marland Kitchen apixaban (ELIQUIS) 5 MG TABS tablet Place 1 tablet (5 mg total) into feeding tube 2 (two) times daily. 60 tablet   . Calcium Carbonate-Vit D-Min (CALCIUM 1200 PO) Take 1 capsule by mouth daily.    . chlorhexidine gluconate, MEDLINE KIT, (PERIDEX) 0.12 % solution 15 mLs by Mouth Rinse route 2 (two) times daily. 120 mL 0  . Cholecalciferol (VITAMIN D) 50 MCG (2000 UT) tablet Take 2,000 Units by mouth daily.     . diphenhydrAMINE-zinc acetate (BENADRYL) cream Apply topically 2 (  two) times daily as needed for itching. 28.4 g 0  . fluticasone (FLONASE) 50 MCG/ACT nasal spray Place 1 spray into both nostrils daily as needed for allergies.     . furosemide (LASIX) 20 MG tablet Place 1 tablet (20 mg total) into feeding tube daily. 30 tablet   . gabapentin (NEURONTIN) 300 MG capsule Take 300 mg by mouth 2 (two) times daily.    . [EXPIRED] influenza vac split quadrivalent PF (FLUARIX) 0.5 ML injection Inject 0.5 mLs into the muscle tomorrow at 10 am for 1 dose. 0.5 mL 0  .  insulin aspart (NOVOLOG) 100 UNIT/ML injection Inject 0-9 Units into the skin every 4 (four) hours. Correction coverage: Sensitive (thin, NPO, renal)  CBG < 70: Implement Hypoglycemia Standing Orders and refer to Hypoglycemia Standing Orders sidebar report  CBG 70 - 120: 0 units  CBG 121 - 150: 1 unit  CBG 151 - 200: 2 units  CBG 201 - 250: 3 units  CBG 251 - 300: 5 units  CBG 301 - 350: 7 units  CBG 351 - 400 9 units  CBG > 400 call MD and obtain STAT lab verification 10 mL 11  . ipratropium (ATROVENT) 0.02 % nebulizer solution Take 2.5 mLs (0.5 mg total) by nebulization every 6 (six) hours as needed for wheezing or shortness of breath (give with xopenex). 75 mL 12  . lactulose (CHRONULAC) 10 GM/15ML solution Place 15 mLs (10 g total) into feeding tube daily. 236 mL 0  . levalbuterol (XOPENEX) 0.63 MG/3ML nebulizer solution Take 3 mLs (0.63 mg total) by nebulization every 6 (six) hours as needed for wheezing or shortness of breath (Give with atrovent). 3 mL 12  . levETIRAcetam (KEPPRA) 100 MG/ML solution Place 5 mLs (500 mg total) into feeding tube 2 (two) times daily. 473 mL 12  . melatonin 3 MG TABS tablet Place 1 tablet (3 mg total) into feeding tube at bedtime as needed (sleep).  0  . morphine 2 MG/ML injection Inject 1 mL (2 mg total) into the vein every 4 (four) hours as needed. 1 mL 0  . nitroGLYCERIN (NITROSTAT) 0.4 MG SL tablet Place 1 tablet (0.4 mg total) under the tongue every 5 (five) minutes as needed for chest pain.  12  . Nutritional Supplements (FEEDING SUPPLEMENT, OSMOLITE 1.5 CAL,) LIQD Place 1,000 mLs into feeding tube continuous.  0  . Nutritional Supplements (FEEDING SUPPLEMENT, PROSOURCE TF,) liquid Place 45 mLs into feeding tube daily.    . ondansetron (ZOFRAN) 4 MG/2ML SOLN injection Inject 2 mLs (4 mg total) into the vein every 6 (six) hours as needed for nausea or vomiting. 2 mL 0  . pantoprazole sodium (PROTONIX) 40 mg/20 mL PACK Place 20 mLs (40 mg total) into  feeding tube daily. 30 mL   . polyethylene glycol (MIRALAX / GLYCOLAX) 17 g packet Place 17 g into feeding tube daily as needed for moderate constipation. 14 each 0  . predniSONE (DELTASONE) 10 MG tablet Take 2 tablets (20 mg total) by mouth daily. 30 tablet 0  . silver sulfADIAZINE (SILVADENE) 1 % cream Apply topically 2 (two) times daily. 50 g 0  . sodium chloride flush (NS) 0.9 % SOLN 10-40 mLs by Intracatheter route every 12 (twelve) hours.    . sodium chloride flush (NS) 0.9 % SOLN 10-40 mLs by Intracatheter route as needed (flush).    . white petrolatum (VASELINE) OINT Apply 1 application topically as needed for lip care.  0    Results for  orders placed or performed during the hospital encounter of 07/26/20 (from the past 48 hour(s))  Comprehensive metabolic panel     Status: Abnormal   Collection Time: 07/27/20  4:08 AM  Result Value Ref Range   Sodium 132 (L) 135 - 145 mmol/L   Potassium 5.0 3.5 - 5.1 mmol/L   Chloride 98 98 - 111 mmol/L   CO2 25 22 - 32 mmol/L   Glucose, Bld 146 (H) 70 - 99 mg/dL    Comment: Glucose reference range applies only to samples taken after fasting for at least 8 hours.   BUN 44 (H) 6 - 20 mg/dL   Creatinine, Ser 1.00 0.44 - 1.00 mg/dL   Calcium 8.9 8.9 - 10.3 mg/dL   Total Protein 6.7 6.5 - 8.1 g/dL   Albumin 2.5 (L) 3.5 - 5.0 g/dL   AST 46 (H) 15 - 41 U/L   ALT 68 (H) 0 - 44 U/L   Alkaline Phosphatase 270 (H) 38 - 126 U/L   Total Bilirubin 1.1 0.3 - 1.2 mg/dL   GFR, Estimated >60 >60 mL/min   Anion gap 9 5 - 15    Comment: Performed at [facility], [place] [address] [place], [place] [address]  CBC with Differential/Platelet     Status: Abnormal   Collection Time: 07/27/20  4:08 AM  Result Value Ref Range   WBC 15.0 (H) 4.0 - 10.5 K/uL   RBC 3.79 (L) 3.87 - 5.11 MIL/uL   Hemoglobin 13.1 12.0 - 15.0 g/dL   HCT 40.7 36 - 46 %   MCV 107.4 (H) 80.0 - 100.0 fL   MCH 34.6 (H) 26.0 - 34.0 pg   MCHC 32.2 30.0 - 36.0 g/dL   RDW 21.7 (H) 11.5 -  15.5 %   Platelets 621 (H) 150 - 400 K/uL   nRBC 0.3 (H) 0.0 - 0.2 %   Neutrophils Relative % 78 %   Neutro Abs 11.7 (H) 1.7 - 7.7 K/uL   Lymphocytes Relative 5 %   Lymphs Abs 0.8 0.7 - 4.0 K/uL   Monocytes Relative 8 %   Monocytes Absolute 1.1 (H) 0.1 - 1.0 K/uL   Eosinophils Relative 0 %   Eosinophils Absolute 0.0 0.0 - 0.5 K/uL   Basophils Relative 0 %   Basophils Absolute 0.0 0.0 - 0.1 K/uL   WBC Morphology MILD LEFT SHIFT (1-5% METAS, OCC MYELO, OCC BANDS)    Immature Granulocytes 9 %   Abs Immature Granulocytes 1.38 (H) 0.00 - 0.07 K/uL    Comment: Performed at [facility], [place] [address] [place], [place] [address]  Protime-INR     Status: Abnormal   Collection Time: 07/27/20  4:08 AM  Result Value Ref Range   Prothrombin Time 19.9 (H) 11.4 - 15.2 seconds   INR 1.8 (H) 0.8 - 1.2    Comment: (NOTE) INR goal varies based on device and disease states. Performed at [facility], [place] [address] [place], [place] [address]   Magnesium     Status: None   Collection Time: 07/27/20  4:08 AM  Result Value Ref Range   Magnesium 2.1 1.7 - 2.4 mg/dL    Comment: Performed at [facility] [address], [place], [place] [address]  Phosphorus     Status: Abnormal   Collection Time: 07/27/20  4:08 AM  Result Value Ref Range   Phosphorus 4.8 (H) 2.5 - 4.6 mg/dL    Comment: Performed at [facility], [place] [address], [place], [place] [address]  Hemoglobin A1c     Status: None   Collection Time: 07/27/20  4:08 AM  Result Value Ref Range   Hgb A1c MFr Bld 5.6 4.8 - 5.6 %    Comment: (NOTE)         Prediabetes: 5.7 - 6.4         Diabetes: >6.4         Glycemic control for adults with diabetes: <7.0    Mean Plasma Glucose 114 mg/dL    Comment: (NOTE) Performed At: [facility] [place], [place] [id number] Rush Farmer MD GE:[id number]   T4, free     Status: None   Collection Time: 07/27/20  4:08 AM  Result Value Ref Range   Free  T4 1.07 0.61 - 1.12 ng/dL    Comment: (NOTE) Biotin ingestion may interfere with free T4 tests. If the results are inconsistent with the TSH level, previous test results, or the clinical presentation, then consider biotin interference. If needed, order repeat testing after stopping biotin. Performed at [facility], [place] [address], [place], [place] [address]   TSH     Status: Abnormal   Collection Time: 07/27/20  4:08 AM  Result Value Ref Range   TSH 5.251 (H) 0.350 - 4.500 uIU/mL    Comment: Performed by a 3rd Generation assay with a functional sensitivity of <=0.01 uIU/mL. Performed at [facility], [place] [address], [place], [place] [address]   Protime-INR     Status: Abnormal   Collection Time: 07/28/20  3:34 AM  Result Value Ref Range   Prothrombin Time 19.9 (H) 11.4 - 15.2 seconds   INR 1.8 (H) 0.8 - 1.2    Comment: (NOTE) INR goal varies based on device and disease states. Performed at [facility], [place] [address] [place], [place] [address]   CBC     Status: Abnormal   Collection Time: 07/28/20  3:34 AM  Result Value Ref Range   WBC 14.4 (H) 4.0 - 10.5 K/uL   RBC 3.34 (L) 3.87 - 5.11 MIL/uL   Hemoglobin 11.4 (L) 12.0 - 15.0 g/dL   HCT 36.2 36 - 46 %   MCV 108.4 (H) 80.0 - 100.0 fL   MCH 34.1 (H) 26.0 - 34.0 pg   MCHC 31.5 30.0 - 36.0 g/dL   RDW 21.8 (H) 11.5 - 15.5 %   Platelets 531 (H) 150 - 400 K/uL   nRBC 0.3 (H) 0.0 - 0.2 %    Comment: Performed at [facility], [place] [address], [place], [place] [address]  Renal function panel     Status: Abnormal   Collection Time: 07/28/20  3:34 AM  Result Value Ref Range   Sodium 131 (L) 135 - 145 mmol/L   Potassium 4.9 3.5 - 5.1 mmol/L   Chloride 98 98 - 111 mmol/L   CO2 24 22 - 32 mmol/L   Glucose, Bld 107 (H) 70 - 99 mg/dL    Comment: Glucose reference range applies only to samples taken after fasting for at least 8 hours.   BUN 46 (H) 6 - 20 mg/dL   Creatinine, Ser 1.01 (H) 0.44 - 1.00 mg/dL    Calcium 8.8 (L) 8.9 - 10.3 mg/dL   Phosphorus 4.1 2.5 - 4.6 mg/dL   Albumin 2.3 (L) 3.5 - 5.0 g/dL   GFR, Estimated >60 >60 mL/min   Anion gap 9 5 -  15    Comment: Performed at [facility], [place] [address] [place], [place] [address]  T4, free     Status: Abnormal   Collection Time: 07/28/20  3:34 AM  Result Value Ref Range   Free T4 1.26 (H) 0.61 - 1.12 ng/dL    Comment: (NOTE) Biotin ingestion may interfere with free T4 tests. If the results are inconsistent with the TSH level, previous test results, or the clinical presentation, then consider biotin interference. If needed, order repeat testing after stopping biotin. Performed at [facility], [place] [address], [place], [place] [address]   TSH     Status: Abnormal   Collection Time: 07/28/20  3:34 AM  Result Value Ref Range   TSH 6.212 (H) 0.350 - 4.500 uIU/mL    Comment: Performed by a 3rd Generation assay with a functional sensitivity of <=0.01 uIU/mL. Performed at [facility], [place] [address], [place], [place] [address]    DG CHEST PORT 1 VIEW  Result Date: 07/27/2020 CLINICAL DATA:  Respiratory failure EXAM: PORTABLE CHEST 1 VIEW COMPARISON:  07/22/2020 FINDINGS: Tracheostomy, enteric tube, and right PICC line are unchanged in position. Cardiac enlargement. Interstitial pattern with bronchiectasis likely representing fibrosis. No consolidation or effusion. No change since previous study. IMPRESSION: Stable appearance of the chest. Electronically Signed   By: Lucienne Capers M.D.   On: 07/27/2020 06:42   ECHOCARDIOGRAM COMPLETE  Result Date: 07/27/2020    ECHOCARDIOGRAM REPORT   Patient Name:   ARADHANA GIN Date of Exam: 07/27/2020 Medical Rec #:  [id number]       Height:       71.0 in Accession #:    [id number]      Weight:       120.8 lb Date of Birth:  [birth year]       BSA:          1.702 m Patient Age:    68 years        BP:           111/66 mmHg Patient Gender: F               HR:           109 bpm.  Exam Location:  Inpatient Procedure: 2D Echo, Cardiac Doppler and Color Doppler Indications:     Bacteremia 790.7 / R78.81  History:         Patient has prior history of Echocardiogram examinations, most                  recent 06/22/2020. CHF, Arrythmias:Atrial Fibrillation; Risk                  Factors:Hypertension and Non-Smoker. Elevated troponin.  Sonographer:     Vickie Epley RDCS Referring Phys:  [place] Diagnosing Phys: Dixie Dials MD  Sonographer Comments: Echo performed with patient supine and on artificial respirator. IMPRESSIONS  1. Left ventricular ejection fraction, by estimation, is 55 to 60%. The left ventricle has normal function. The left ventricle demonstrates regional wall motion abnormalities (see scoring diagram/findings for description). There is mild concentric left ventricular hypertrophy. Left ventricular diastolic parameters are consistent with Grade I diastolic dysfunction (impaired relaxation). Septal flattening suggestive of elevated RV pressures.  2. Right ventricular systolic function is mildly reduced. The right ventricular size is moderately enlarged. There is severely elevated pulmonary artery systolic pressure.  3. Right atrial size was moderately dilated.  4. The pericardial effusion is  posterior to the left ventricle.  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 Trivial mitral valve regurgitation.  6. Tricuspid valve regurgitation is severe.  7. The aortic valve is tricuspid. There is mild calcification of the aortic valve. There is mild thickening of the aortic valve. Aortic valve regurgitation is trivial. Mild aortic valve sclerosis is present, with no evidence of aortic valve stenosis.  8. The inferior vena cava is normal in size with greater than 50% respiratory variability, suggesting right atrial pressure of 3 mmHg. FINDINGS  Left Ventricle: Left ventricular ejection fraction, by estimation, is 55 to 60%. The left ventricle has normal function. The left ventricle  demonstrates regional wall motion abnormalities. The left ventricular internal cavity size was normal in size. There is mild concentric left ventricular hypertrophy. Left ventricular diastolic parameters are consistent with Grade I diastolic dysfunction (impaired relaxation).  LV Wall Scoring: There is diffuse normal. Septal flattening suggestive of elevated RV pressures. Right Ventricle: The right ventricular size is moderately enlarged. No increase in right ventricular wall thickness. Right ventricular systolic function is mildly reduced. There is severely elevated pulmonary artery systolic pressure. The tricuspid regurgitant velocity is 4.32 m/s, and with an assumed right atrial pressure of 3 mmHg, the estimated right ventricular systolic pressure is 69.4 mmHg. Left Atrium: Left atrial size was normal in size. Right Atrium: Right atrial size was moderately dilated. Pericardium: Trivial pericardial effusion is present. The pericardial effusion is posterior to the left ventricle. Mitral Valve: The mitral valve is myxomatous. Trivial mitral valve regurgitation. Tricuspid Valve: The tricuspid valve is normal in structure. Tricuspid valve regurgitation is severe. Aortic Valve: The aortic valve is tricuspid. There is mild calcification of the aortic valve. There is mild thickening of the aortic valve. There is mild aortic valve annular calcification. Aortic valve regurgitation is trivial. Mild aortic valve sclerosis is present, with no evidence of aortic valve stenosis. Pulmonic Valve: The pulmonic valve was normal in structure. Pulmonic valve regurgitation is not visualized. Aorta: The aortic root is normal in size and structure. There is minimal (Grade I) atheroma plaque involving the aortic root and ascending aorta. Venous: The inferior vena cava is normal in size with greater than 50% respiratory variability, suggesting right atrial pressure of 3 mmHg. IAS/Shunts: The interatrial septum was not assessed.  LEFT  VENTRICLE PLAX 2D LVIDd:         2.60 cm LVIDs:         2.00 cm LV PW:         1.20 cm LV IVS:        1.10 cm LVOT diam:     2.00 cm LV SV:         31 LV SV Index:   18 LVOT Area:     3.14 cm  LV Volumes (MOD) LV vol d, MOD A4C: 36.7 ml LV vol s, MOD A4C: 19.2 ml LV SV MOD A4C:     36.7 ml RIGHT VENTRICLE RV S prime:     11.90 cm/s TAPSE (M-mode): 1.4 cm LEFT ATRIUM           Index       RIGHT ATRIUM           Index LA diam:      2.10 cm 1.23 cm/m  RA Area:     14.00 cm LA Vol (A4C): 23.1 ml 13.57 ml/m RA Volume:   34.30 ml  20.15 ml/m  AORTIC VALVE LVOT Vmax:   63.70 cm/s LVOT Vmean:  40.900 cm/s  LVOT VTI:    0.098 m  AORTA Ao Root diam: 3.40 cm Ao Asc diam:  3.20 cm MITRAL VALVE               TRICUSPID VALVE MV Area (PHT): 5.75 cm    TR Peak grad:   74.6 mmHg MV Decel Time: 132 msec    TR Vmax:        432.00 cm/s MV E velocity: 59.20 cm/s MV A velocity: 65.50 cm/s  SHUNTS MV E/A ratio:  0.90        Systemic VTI:  0.10 m                            Systemic Diam: 2.00 cm Dixie Dials MD Electronically signed by Dixie Dials MD Signature Date/Time: 07/27/2020/7:55:52 PM    Final     Review Of Systems Constitutional: Positive fever, chills, weight loss. Eyes: No vision change, wears glasses. No discharge or pain. Ears: No hearing loss, No tinnitus. Respiratory: Positve asthma, COPD, pneumonias, shortness of breath. No hemoptysis. Cardiovascular: Positive chest pain, palpitation, leg edema. Gastrointestinal: No nausea, vomiting, positive diarrhea, constipation. No GI bleed. No hepatitis. Genitourinary: No dysuria, hematuria, kidney stone. No incontinance. Neurological: No headache, stroke, seizures.  Psychiatry: No psych facility admission for anxiety, depression, suicide. No detox. Skin: No rash. Musculoskeletal: Positive joint pain, fibromyalgia,  neck pain, back pain. Lymphadenopathy: No lymphadenopathy. Hematology: Positive anemia and easy bruising.  P: 100, R: 16, BP: 120/67, Saturation 100  %. Last menstrual period 02/19/2013. There is no height or weight on file to calculate BMI. General appearance: alert, cooperative, appears stated age and mild distress Head: Normocephalic, atraumatic. Eyes: Brown eyes, pink conjunctiva, corneas clear. Neck: No adenopathy, no carotid bruit, no JVD, supple, symmetrical, trachea midline and thyroid not enlarged. Resp: Clear to auscultation bilaterally. Cardio: Regular rate and rhythm, S1, S2 normal, II/VI systolic murmur, no click, rub or gallop GI: Soft, non-tender; bowel sounds normal; no organomegaly. Extremities: Trace edema, no cyanosis or clubbing. Bilateral lower leg and left heel wound with dressing. Skin: Warm and dry.  Neurologic: Alert and oriented X 3. Normal coordination.  Assessment/Plan Acute on chronic respiratory failure with hypoxia. Pulmonary fibrosis with moderate pulmonary systolic hypertension Anoxic brain injury Chronic diastolic left heart failure Rheumatoid arthritis  Continue Amiodarone, Eliquis, furosemide, Prednisone.  Time spent: Review of old records, Lab, x-rays, EKG, other cardiac tests, examination, discussion with patient, Nurse and PA over 70 minutes.  Birdie Riddle, MD  07/28/2020, 11:27 PM

## 2020-07-28 NOTE — Consult Note (Signed)
[facility]  PULMONARY SERVICE  Date of Service: 07/28/2020  PULMONARY CRITICAL CARE CONSULT   Carolyn Proctor  VEH:[id number]  DOB: [birth year]   DOA: 07/26/2020  Referring Physician: Merton Border, MD  HPI: Carolyn Proctor is a 59 y.o. female seen for follow up of Acute on Chronic Respiratory Failure.  Patient has multiple medical problems including paroxysmal atrial fibrillation DVT on Eliquis heart failure preserved ejection fraction CKD stage III rheumatoid arthritis pulmonary fibrosis emphysema came into the hospital because of a possible stroke.  Patient was noted to have a left facial droop and also became unresponsive.  Patient was intubated for airway protection in the ER a CT of the head was done which was initially negative patient also was noted to be significantly hypoglycemic patient also developed hypotension and was given steroid boost because of chronic steroid use had ongoing difficulty with weaning.  Eventually had to have a tracheostomy done.  She is now on T collar and on no 28% FiO2 sent to [place] for further management and weaning.  Review of Systems:  ROS performed and is unremarkable other than noted above.  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 2014 ---> progressive fibrosis on 2020 CT  02/22/2013   Associated with collagen vascular disorder    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 CATH LAB;  Service: Cardiovascular;  Laterality: N/A;   RIGHT/LEFT HEART CATH AND CORONARY ANGIOGRAPHY N/A 09/09/2019   Procedure: RIGHT/LEFT HEART CATH AND CORONARY ANGIOGRAPHY;  Surgeon: Jettie Booze, MD;  Location: [place] CV LAB;  Service: Cardiovascular;  Laterality: N/A;    Social History:    reports that she has never smoked. She has never used smokeless tobacco. She reports that she does not drink alcohol and does not use drugs.  Family History: Non-Contributory to the present illness  No Known Allergies  Medications: Reviewed on Rounds  Physical Exam:  Vitals: Temperature 97.1 pulse 101 respiratory 15 blood pressure is 124/67 saturations 100%  Ventilator Settings on T collar FiO2 28%   General: Comfortable at this time  Eyes: Grossly normal lids, irises & conjunctiva  ENT: grossly tongue is normal  Neck: no obvious mass  Cardiovascular: S1-S2 normal no gallop or rub  Respiratory: Scattered rhonchi no rales  Abdomen: Soft and nontender  Skin: no rash seen on limited exam  Musculoskeletal: not rigid  Psychiatric:unable to assess  Neurologic: no seizure no involuntary movements         Labs on Admission:  Basic  Metabolic Panel: Recent Labs  Lab 07/24/20 0446 07/25/20 0437 07/26/20 0500 07/27/20 0408 07/28/20 0334  NA 131* 133* 132* 132* 131*  K 5.2* 5.3* 4.9 5.0 4.9  CL 98 98 97* 98 98  CO2 '25 25 27 25 24  ' GLUCOSE 139* 159* 127* 146* 107*  BUN 46* 46* 44* 44* 46*  CREATININE  1.12* 1.11* 1.09* 1.00 1.01*  CALCIUM 8.7* 8.7* 8.7* 8.9 8.8*  MG  --   --   --  2.1  --   PHOS  --   --   --  4.8* 4.1    No results for input(s): PHART, PCO2ART, PO2ART, HCO3, O2SAT in the last 168 hours.  Liver Function Tests: Recent Labs  Lab 07/22/20 0500 07/27/20 0408 07/28/20 0334  AST 40 46*  --   ALT 48* 68*  --   ALKPHOS 268* 270*  --   BILITOT 0.7 1.1  --   PROT 6.0* 6.7  --   ALBUMIN 1.9* 2.5* 2.3*   No results for input(s): LIPASE, AMYLASE in the last 168 hours. Recent Labs  Lab 07/22/20 0500 07/25/20 0438  AMMONIA 39* 37*    CBC: Recent Labs  Lab 07/22/20 0500 07/27/20 0408 07/28/20 0334  WBC 16.2* 15.0* 14.4*  NEUTROABS  --  11.7*  --   HGB 11.6* 13.1 11.4*  HCT 37.9 40.7 36.2  MCV 106.8* 107.4* 108.4*  PLT 643* 621* 531*    Cardiac Enzymes: No results for input(s): CKTOTAL, CKMB, CKMBINDEX, TROPONINI in the last 168 hours.  BNP (last 3 results) No results for input(s): BNP in the last 8760 hours.  ProBNP (last 3 results) No results for input(s): PROBNP in the last 8760 hours.   Radiological Exams on Admission: DG CHEST PORT 1 VIEW  Result Date: 07/27/2020 CLINICAL DATA:  Respiratory failure EXAM: PORTABLE CHEST 1 VIEW COMPARISON:  07/22/2020 FINDINGS: Tracheostomy, enteric tube, and right PICC line are unchanged in position. Cardiac enlargement. Interstitial pattern with bronchiectasis likely representing fibrosis. No consolidation or effusion. No change since previous study. IMPRESSION: Stable appearance of the chest. Electronically Signed   By: Lucienne Capers M.D.   On: 07/27/2020 06:42   DG Abd Portable 1V  Result Date: 07/26/2020 CLINICAL DATA:  Nasogastric tube placement. EXAM: PORTABLE ABDOMEN - 1 VIEW COMPARISON:  None. FINDINGS: A nasogastric tube is seen with its distal tip overlying the medial aspect of the right upper quadrant. This is likely looped within the body of the stomach. The bowel gas pattern is normal. A mild amount  of retained contrast is seen within the large bowel. No radio-opaque calculi or other significant radiographic abnormality are seen. IMPRESSION: Nasogastric tube positioning, as described above. Electronically Signed   By: Virgina Norfolk M.D.   On: 07/26/2020 19:33   ECHOCARDIOGRAM COMPLETE  Result Date: 07/27/2020    ECHOCARDIOGRAM REPORT   Patient Name:   Carolyn Proctor Date of Exam: 07/27/2020 Medical Rec #:  [id number]       Height:       71.0 in Accession #:    [id number]      Weight:       120.8 lb Date of Birth:  Nov 12, 1960       BSA:          1.702 m Patient Age:    85 years        BP:           111/66 mmHg Patient Gender: F  HR:           109 bpm. Exam Location:  Inpatient Procedure: 2D Echo, Cardiac Doppler and Color Doppler Indications:     Bacteremia 790.7 / R78.81  History:         Patient has prior history of Echocardiogram examinations, most                  recent 06/22/2020. CHF, Arrythmias:Atrial Fibrillation; Risk                  Factors:Hypertension and Non-Smoker. Elevated troponin.  Sonographer:     Vickie Epley RDCS Referring Phys:  [place] Diagnosing Phys: Dixie Dials MD  Sonographer Comments: Echo performed with patient supine and on artificial respirator. IMPRESSIONS  1. Left ventricular ejection fraction, by estimation, is 55 to 60%. The left ventricle has normal function. The left ventricle demonstrates regional wall motion abnormalities (see scoring diagram/findings for description). There is mild concentric left ventricular hypertrophy. Left ventricular diastolic parameters are consistent with Grade I diastolic dysfunction (impaired relaxation). Septal flattening suggestive of elevated RV pressures.  2. Right ventricular systolic function is mildly reduced. The right ventricular size is moderately enlarged. There is severely elevated pulmonary artery systolic pressure.  3. Right atrial size was moderately dilated.  4. The pericardial effusion is posterior  to the left ventricle.  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 Trivial mitral valve regurgitation.  6. Tricuspid valve regurgitation is severe.  7. The aortic valve is tricuspid. There is mild calcification of the aortic valve. There is mild thickening of the aortic valve. Aortic valve regurgitation is trivial. Mild aortic valve sclerosis is present, with no evidence of aortic valve stenosis.  8. The inferior vena cava is normal in size with greater than 50% respiratory variability, suggesting right atrial pressure of 3 mmHg. FINDINGS  Left Ventricle: Left ventricular ejection fraction, by estimation, is 55 to 60%. The left ventricle has normal function. The left ventricle demonstrates regional wall motion abnormalities. The left ventricular internal cavity size was normal in size. There is mild concentric left ventricular hypertrophy. Left ventricular diastolic parameters are consistent with Grade I diastolic dysfunction (impaired relaxation).  LV Wall Scoring: There is diffuse normal. Septal flattening suggestive of elevated RV pressures. Right Ventricle: The right ventricular size is moderately enlarged. No increase in right ventricular wall thickness. Right ventricular systolic function is mildly reduced. There is severely elevated pulmonary artery systolic pressure. The tricuspid regurgitant velocity is 4.32 m/s, and with an assumed right atrial pressure of 3 mmHg, the estimated right ventricular systolic pressure is 90.3 mmHg. Left Atrium: Left atrial size was normal in size. Right Atrium: Right atrial size was moderately dilated. Pericardium: Trivial pericardial effusion is present. The pericardial effusion is posterior to the left ventricle. Mitral Valve: The mitral valve is myxomatous. Trivial mitral valve regurgitation. Tricuspid Valve: The tricuspid valve is normal in structure. Tricuspid valve regurgitation is severe. Aortic Valve: The aortic valve is tricuspid. There is mild calcification of the aortic  valve. There is mild thickening of the aortic valve. There is mild aortic valve annular calcification. Aortic valve regurgitation is trivial. Mild aortic valve sclerosis is present, with no evidence of aortic valve stenosis. Pulmonic Valve: The pulmonic valve was normal in structure. Pulmonic valve regurgitation is not visualized. Aorta: The aortic root is normal in size and structure. There is minimal (Grade I) atheroma plaque involving the aortic root and ascending aorta. Venous: The inferior vena cava is normal in size with greater  than 50% respiratory variability, suggesting right atrial pressure of 3 mmHg. IAS/Shunts: The interatrial septum was not assessed.  LEFT VENTRICLE PLAX 2D LVIDd:         2.60 cm LVIDs:         2.00 cm LV PW:         1.20 cm LV IVS:        1.10 cm LVOT diam:     2.00 cm LV SV:         31 LV SV Index:   18 LVOT Area:     3.14 cm  LV Volumes (MOD) LV vol d, MOD A4C: 36.7 ml LV vol s, MOD A4C: 19.2 ml LV SV MOD A4C:     36.7 ml RIGHT VENTRICLE RV S prime:     11.90 cm/s TAPSE (M-mode): 1.4 cm LEFT ATRIUM           Index       RIGHT ATRIUM           Index LA diam:      2.10 cm 1.23 cm/m  RA Area:     14.00 cm LA Vol (A4C): 23.1 ml 13.57 ml/m RA Volume:   34.30 ml  20.15 ml/m  AORTIC VALVE LVOT Vmax:   63.70 cm/s LVOT Vmean:  40.900 cm/s LVOT VTI:    0.098 m  AORTA Ao Root diam: 3.40 cm Ao Asc diam:  3.20 cm MITRAL VALVE               TRICUSPID VALVE MV Area (PHT): 5.75 cm    TR Peak grad:   74.6 mmHg MV Decel Time: 132 msec    TR Vmax:        432.00 cm/s MV E velocity: 59.20 cm/s MV A velocity: 65.50 cm/s  SHUNTS MV E/A ratio:  0.90        Systemic VTI:  0.10 m                            Systemic Diam: 2.00 cm Dixie Dials MD Electronically signed by Dixie Dials MD Signature Date/Time: 07/27/2020/7:55:52 PM    Final     Assessment/Plan Active Problems:   Acute on chronic respiratory failure with hypoxia (HCC)   Chronic diastolic CHF (congestive heart failure) (HCC)   Pulmonary  fibrosis (HCC)   Cor pulmonale (chronic) (HCC)   Anoxic brain injury ([place])   Chronic pulmonary hypertension ([place])   1. Acute on chronic respiratory failure hypoxia we will continue with the weaning try capping trials today as tolerated 2. Pulmonary fibrosis advanced disease prognosis overall is quite guarded. 3. Cor pulmonale monitor fluid status cardiology consultation 4. Chronic pulmonary hypertension secondary to underlying pulmonary disease and cor pulmonale 5. Anoxic brain injury slow improvement  I have personally seen and evaluated the patient, evaluated laboratory and imaging results, formulated the assessment and plan and placed orders. The Patient requires high complexity decision making with multiple systems involvement.  Case was discussed on Rounds with the Respiratory Therapy Director and the Respiratory staff Time Spent 52mnutes  Latorie Montesano A Janeene Sand, MD FSanta Barbara Endoscopy Center LLCPulmonary Critical Care Medicine Sleep Medicine

## 2020-07-29 LAB — COMPREHENSIVE METABOLIC PANEL
ALT: 58 U/L — ABNORMAL HIGH (ref 0–44)
AST: 42 U/L — ABNORMAL HIGH (ref 15–41)
Albumin: 2.3 g/dL — ABNORMAL LOW (ref 3.5–5.0)
Alkaline Phosphatase: 215 U/L — ABNORMAL HIGH (ref 38–126)
Anion gap: 12 (ref 5–15)
BUN: 46 mg/dL — ABNORMAL HIGH (ref 6–20)
CO2: 25 mmol/L (ref 22–32)
Calcium: 8.7 mg/dL — ABNORMAL LOW (ref 8.9–10.3)
Chloride: 97 mmol/L — ABNORMAL LOW (ref 98–111)
Creatinine, Ser: 1.03 mg/dL — ABNORMAL HIGH (ref 0.44–1.00)
GFR, Estimated: >60 mL/min (ref >60)
Glucose, Bld: 114 mg/dL — ABNORMAL HIGH (ref 70–99)
Potassium: 4.9 mmol/L (ref 3.5–5.1)
Sodium: 134 mmol/L — ABNORMAL LOW (ref 135–145)
Total Bilirubin: 0.9 mg/dL (ref 0.3–1.2)
Total Protein: 6.1 g/dL — ABNORMAL LOW (ref 6.5–8.1)

## 2020-07-29 LAB — AMMONIA: Ammonia: 28 umol/L (ref 9–35)

## 2020-07-29 NOTE — Consult Note (Signed)
Ref: Lilian Coma., MD   Subjective:  Awake. Afebrile.  Objective:  Vital Signs in the last 24 hours:  P: 89, Sinus rhythm  R: 17  BP: 122/80 O2 sat 100 %.  Physical Exam: BP Readings from Last 1 Encounters:  07/26/20 111/66     Wt Readings from Last 1 Encounters:  07/25/20 54.8 kg    Weight change:  There is no height or weight on file to calculate BMI. HEENT: [place]/AT, Eyes-Brown, Conjunctiva-Pink, Sclera-Non-icteric Neck: No JVD, No bruit, Trachea midline. Lungs:  Clear, Bilateral. Cardiac:  Regular rhythm, normal S1 and S2, no S3. II/VI systolic murmur. Abdomen:  Soft, non-tender. BS present. Extremities:  Trace edema present. No cyanosis. No clubbing. CNS: AxOx3, Cranial nerves grossly intact, moves all 4 extremities.  Skin: Warm and dry.   Intake/Output from previous day: No intake/output data recorded.    Lab Results: BMET    Component Value Date/Time   NA 134 (L) 07/29/2020 0437   NA 131 (L) 07/28/2020 0334   NA 132 (L) 07/27/2020 0408   NA 139 08/17/2019 1520   K 4.9 07/29/2020 0437   K 4.9 07/28/2020 0334   K 5.0 07/27/2020 0408   CL 97 (L) 07/29/2020 0437   CL 98 07/28/2020 0334   CL 98 07/27/2020 0408   CO2 25 07/29/2020 0437   CO2 24 07/28/2020 0334   CO2 25 07/27/2020 0408   GLUCOSE 114 (H) 07/29/2020 0437   GLUCOSE 107 (H) 07/28/2020 0334   GLUCOSE 146 (H) 07/27/2020 0408   BUN 46 (H) 07/29/2020 0437   BUN 46 (H) 07/28/2020 0334   BUN 44 (H) 07/27/2020 0408   BUN 18 08/17/2019 1520   CREATININE 1.03 (H) 07/29/2020 0437   CREATININE 1.01 (H) 07/28/2020 0334   CREATININE 1.00 07/27/2020 0408   CREATININE 0.75 03/31/2013 1536   CALCIUM 8.7 (L) 07/29/2020 0437   CALCIUM 8.8 (L) 07/28/2020 0334   CALCIUM 8.9 07/27/2020 0408   GFRNONAA >60 07/29/2020 0437   GFRNONAA >60 07/28/2020 0334   GFRNONAA >60 07/27/2020 0408   GFRAA >60 07/12/2020 0538   GFRAA 60 (L) 07/11/2020 0523   GFRAA >60 07/10/2020 0518   CBC    Component Value  Date/Time   WBC 14.4 (H) 07/28/2020 0334   RBC 3.34 (L) 07/28/2020 0334   HGB 11.4 (L) 07/28/2020 0334   HGB 14.0 08/17/2019 1520   HGB 13.8 04/02/2013 0901   HCT 36.2 07/28/2020 0334   HCT 42.4 08/17/2019 1520   HCT 40.9 04/02/2013 0901   PLT 531 (H) 07/28/2020 0334   PLT 449 08/17/2019 1520   MCV 108.4 (H) 07/28/2020 0334   MCV 90 08/17/2019 1520   MCV 99 04/02/2013 0901   MCH 34.1 (H) 07/28/2020 0334   MCHC 31.5 07/28/2020 0334   RDW 21.8 (H) 07/28/2020 0334   RDW 16.3 (H) 08/17/2019 1520   RDW 12.7 04/02/2013 0901   LYMPHSABS 0.8 07/27/2020 0408   LYMPHSABS 2.0 04/02/2013 0901   MONOABS 1.1 (H) 07/27/2020 0408   EOSABS 0.0 07/27/2020 0408   EOSABS 0.5 04/02/2013 0901   BASOSABS 0.0 07/27/2020 0408   BASOSABS 0.1 04/02/2013 0901   HEPATIC Function Panel Recent Labs    07/22/20 0500 07/27/20 0408 07/29/20 0437  PROT 6.0* 6.7 6.1*   HEMOGLOBIN A1C No components found for: HGA1C,  MPG CARDIAC ENZYMES Lab Results  Component Value Date   CKTOTAL 22 (L) 07/03/2020   CKMB 0.7 07/17/2007   TROPONINI 0.01 03/10/2013  TROPONINI 7.57 (HH) 02/24/2013   TROPONINI 1.47 (HH) 02/23/2013   BNP No results for input(s): PROBNP in the last 8760 hours. TSH Recent Labs    07/03/20 1455 07/27/20 0408 07/28/20 0334  TSH 2.384 5.251* 6.212*   CHOLESTEROL No results for input(s): CHOL in the last 8760 hours.  Scheduled Meds: Continuous Infusions: PRN Meds:.  Assessment/Plan: Acute on chronic respiratory failure with hypoxia Pulmonary fibrosis with moderate pulmonary systolic hypertension Paroxysmal atrial fibrillation Anoxic brain injury Chronic diastolic left heart failure Rheumatoid arthritis Protein calorie malnutrition  Continue medical treatment.   LOS: 0 days   Time spent including chart review, lab review, examination, discussion with patient : 25 min   Dixie Dials  MD  07/29/2020, 8:46 PM

## 2020-07-29 NOTE — Progress Notes (Addendum)
Pulmonary Critical Care Medicine [place]   PULMONARY CRITICAL CARE SERVICE  PROGRESS NOTE  Date of Service: 07/29/2020  JULENE RAHN  KGU:[id number]  DOB: [birth year]   DOA: 07/26/2020  Referring Physician: Merton Border, MD  HPI: COURTENEY ALDERETE is a 59 y.o. female seen for follow up of Acute on Chronic Respiratory Failure.  Patient remains capped on 3 L at this time satting well no distress.  Medications: Reviewed on Rounds  Physical Exam:  Vitals: Pulse 93 respirations 13 BP 134/80 O2 sat 100% temp 96.1  Ventilator Settings not currently on ventilator  . General: Comfortable at this time . Eyes: Grossly normal lids, irises & conjunctiva . ENT: grossly tongue is normal . Neck: no obvious mass . Cardiovascular: S1 S2 normal no gallop . Respiratory: No rales or rhonchi noted . Abdomen: soft . Skin: no rash seen on limited exam . Musculoskeletal: not rigid . Psychiatric:unable to assess . Neurologic: no seizure no involuntary movements         Lab Data:   Basic Metabolic Panel: Recent Labs  Lab 07/25/20 0437 07/26/20 0500 07/27/20 0408 07/28/20 0334 07/29/20 0437  NA 133* 132* 132* 131* 134*  K 5.3* 4.9 5.0 4.9 4.9  CL 98 97* 98 98 97*  CO2 25 27 25 24 25   GLUCOSE 159* 127* 146* 107* 114*  BUN 46* 44* 44* 46* 46*  CREATININE 1.11* 1.09* 1.00 1.01* 1.03*  CALCIUM 8.7* 8.7* 8.9 8.8* 8.7*  MG  --   --  2.1  --   --   PHOS  --   --  4.8* 4.1  --     ABG: No results for input(s): PHART, PCO2ART, PO2ART, HCO3, O2SAT in the last 168 hours.  Liver Function Tests: Recent Labs  Lab 07/27/20 0408 07/28/20 0334 07/29/20 0437  AST 46*  --  42*  ALT 68*  --  58*  ALKPHOS 270*  --  215*  BILITOT 1.1  --  0.9  PROT 6.7  --  6.1*  ALBUMIN 2.5* 2.3* 2.3*   No results for input(s): LIPASE, AMYLASE in the last 168 hours. Recent Labs  Lab 07/25/20 0438 07/29/20 0437  AMMONIA 37* 28    CBC: Recent Labs  Lab 07/27/20 0408  07/28/20 0334  WBC 15.0* 14.4*  NEUTROABS 11.7*  --   HGB 13.1 11.4*  HCT 40.7 36.2  MCV 107.4* 108.4*  PLT 621* 531*    Cardiac Enzymes: No results for input(s): CKTOTAL, CKMB, CKMBINDEX, TROPONINI in the last 168 hours.  BNP (last 3 results) No results for input(s): BNP in the last 8760 hours.  ProBNP (last 3 results) No results for input(s): PROBNP in the last 8760 hours.  Radiological Exams: ECHOCARDIOGRAM COMPLETE  Result Date: 07/27/2020    ECHOCARDIOGRAM REPORT   Patient Name:   ASIANA BENNINGER Date of Exam: 07/27/2020 Medical Rec #:  [id number]       Height:       71.0 in Accession #:    [id number]      Weight:       120.8 lb Date of Birth:  1961-05-31       BSA:          1.702 m Patient Age:    34 years        BP:           111/66 mmHg Patient Gender: F  HR:           109 bpm. Exam Location:  Inpatient Procedure: 2D Echo, Cardiac Doppler and Color Doppler Indications:     Bacteremia 790.7 / R78.81  History:         Patient has prior history of Echocardiogram examinations, most                  recent 06/22/2020. CHF, Arrythmias:Atrial Fibrillation; Risk                  Factors:Hypertension and Non-Smoker. Elevated troponin.  Sonographer:     Vickie Epley RDCS Referring Phys:  [place] Diagnosing Phys: Dixie Dials MD  Sonographer Comments: Echo performed with patient supine and on artificial respirator. IMPRESSIONS  1. Left ventricular ejection fraction, by estimation, is 55 to 60%. The left ventricle has normal function. The left ventricle demonstrates regional wall motion abnormalities (see scoring diagram/findings for description). There is mild concentric left ventricular hypertrophy. Left ventricular diastolic parameters are consistent with Grade I diastolic dysfunction (impaired relaxation). Septal flattening suggestive of elevated RV pressures.  2. Right ventricular systolic function is mildly reduced. The right ventricular size is moderately enlarged.  There is severely elevated pulmonary artery systolic pressure.  3. Right atrial size was moderately dilated.  4. The pericardial effusion is posterior to the left ventricle.  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 Trivial mitral valve regurgitation.  6. Tricuspid valve regurgitation is severe.  7. The aortic valve is tricuspid. There is mild calcification of the aortic valve. There is mild thickening of the aortic valve. Aortic valve regurgitation is trivial. Mild aortic valve sclerosis is present, with no evidence of aortic valve stenosis.  8. The inferior vena cava is normal in size with greater than 50% respiratory variability, suggesting right atrial pressure of 3 mmHg. FINDINGS  Left Ventricle: Left ventricular ejection fraction, by estimation, is 55 to 60%. The left ventricle has normal function. The left ventricle demonstrates regional wall motion abnormalities. The left ventricular internal cavity size was normal in size. There is mild concentric left ventricular hypertrophy. Left ventricular diastolic parameters are consistent with Grade I diastolic dysfunction (impaired relaxation).  LV Wall Scoring: There is diffuse normal. Septal flattening suggestive of elevated RV pressures. Right Ventricle: The right ventricular size is moderately enlarged. No increase in right ventricular wall thickness. Right ventricular systolic function is mildly reduced. There is severely elevated pulmonary artery systolic pressure. The tricuspid regurgitant velocity is 4.32 m/s, and with an assumed right atrial pressure of 3 mmHg, the estimated right ventricular systolic pressure is 79.4 mmHg. Left Atrium: Left atrial size was normal in size. Right Atrium: Right atrial size was moderately dilated. Pericardium: Trivial pericardial effusion is present. The pericardial effusion is posterior to the left ventricle. Mitral Valve: The mitral valve is myxomatous. Trivial mitral valve regurgitation. Tricuspid Valve: The tricuspid valve is  normal in structure. Tricuspid valve regurgitation is severe. Aortic Valve: The aortic valve is tricuspid. There is mild calcification of the aortic valve. There is mild thickening of the aortic valve. There is mild aortic valve annular calcification. Aortic valve regurgitation is trivial. Mild aortic valve sclerosis is present, with no evidence of aortic valve stenosis. Pulmonic Valve: The pulmonic valve was normal in structure. Pulmonic valve regurgitation is not visualized. Aorta: The aortic root is normal in size and structure. There is minimal (Grade I) atheroma plaque involving the aortic root and ascending aorta. Venous: The inferior vena cava is normal in size with greater  than 50% respiratory variability, suggesting right atrial pressure of 3 mmHg. IAS/Shunts: The interatrial septum was not assessed.  LEFT VENTRICLE PLAX 2D LVIDd:         2.60 cm LVIDs:         2.00 cm LV PW:         1.20 cm LV IVS:        1.10 cm LVOT diam:     2.00 cm LV SV:         31 LV SV Index:   18 LVOT Area:     3.14 cm  LV Volumes (MOD) LV vol d, MOD A4C: 36.7 ml LV vol s, MOD A4C: 19.2 ml LV SV MOD A4C:     36.7 ml RIGHT VENTRICLE RV S prime:     11.90 cm/s TAPSE (M-mode): 1.4 cm LEFT ATRIUM           Index       RIGHT ATRIUM           Index LA diam:      2.10 cm 1.23 cm/m  RA Area:     14.00 cm LA Vol (A4C): 23.1 ml 13.57 ml/m RA Volume:   34.30 ml  20.15 ml/m  AORTIC VALVE LVOT Vmax:   63.70 cm/s LVOT Vmean:  40.900 cm/s LVOT VTI:    0.098 m  AORTA Ao Root diam: 3.40 cm Ao Asc diam:  3.20 cm MITRAL VALVE               TRICUSPID VALVE MV Area (PHT): 5.75 cm    TR Peak grad:   74.6 mmHg MV Decel Time: 132 msec    TR Vmax:        432.00 cm/s MV E velocity: 59.20 cm/s MV A velocity: 65.50 cm/s  SHUNTS MV E/A ratio:  0.90        Systemic VTI:  0.10 m                            Systemic Diam: 2.00 cm Dixie Dials MD Electronically signed by Dixie Dials MD Signature Date/Time: 07/27/2020/7:55:52 PM    Final      Assessment/Plan Active Problems:   Acute on chronic respiratory failure with hypoxia (HCC)   Chronic diastolic CHF (congestive heart failure) (HCC)   Pulmonary fibrosis (HCC)   Cor pulmonale (chronic) (HCC)   Anoxic brain injury ([place])   Chronic pulmonary hypertension ([place])   1. Acute on chronic respiratory failure hypoxia patient will continue capping on 3 L nasal cannula continue supportive measures. 2. Pulmonary fibrosis advanced disease prognosis overall is quite guarded. 3. Cor pulmonale monitor fluid status cardiology consultation 4. Chronic pulmonary hypertension secondary to underlying pulmonary disease and cor pulmonale 5. Anoxic brain injury slow improvement   I have personally seen and evaluated the patient, evaluated laboratory and imaging results, formulated the assessment and plan and placed orders. The Patient requires high complexity decision making with multiple systems involvement.  Rounds were done with the Respiratory Therapy Director and Staff therapists and discussed with nursing staff also.  Allyne Gee, MD [facility] Pulmonary Critical Care Medicine Sleep Medicine

## 2020-07-30 NOTE — Progress Notes (Signed)
Pulmonary Critical Care Medicine [place]   PULMONARY CRITICAL CARE SERVICE  PROGRESS NOTE  Date of Service: 07/30/2020  Carolyn Proctor  WNI:[id number]  DOB: 11-15-1960   DOA: 07/26/2020  Referring Physician: Merton Border, MD  HPI: Carolyn Proctor is a 59 y.o. female seen for follow up of Acute on Chronic Respiratory Failure.  Patient is capping doing well.  Going to move slowly with her because of the underlying pulmonary fibrosis that she has.  Currently FiO2 has been weaned down to low 2 L right now  Medications: Reviewed on Rounds  Physical Exam:  Vitals: Temperature is 97.3 pulse 103 respiratory rate is 18 blood pressure is 126/68 saturations 100%  Ventilator Settings on capping trials 2 L of oxygen  . General: Comfortable at this time . Eyes: Grossly normal lids, irises & conjunctiva . ENT: grossly tongue is normal . Neck: no obvious mass . Cardiovascular: S1 S2 normal no gallop . Respiratory: No rhonchi no rales noted at this time . Abdomen: soft . Skin: no rash seen on limited exam . Musculoskeletal: not rigid . Psychiatric:unable to assess . Neurologic: no seizure no involuntary movements         Lab Data:   Basic Metabolic Panel: Recent Labs  Lab 07/25/20 0437 07/26/20 0500 07/27/20 0408 07/28/20 0334 07/29/20 0437  NA 133* 132* 132* 131* 134*  K 5.3* 4.9 5.0 4.9 4.9  CL 98 97* 98 98 97*  CO2 25 27 25 24 25   GLUCOSE 159* 127* 146* 107* 114*  BUN 46* 44* 44* 46* 46*  CREATININE 1.11* 1.09* 1.00 1.01* 1.03*  CALCIUM 8.7* 8.7* 8.9 8.8* 8.7*  MG  --   --  2.1  --   --   PHOS  --   --  4.8* 4.1  --     ABG: No results for input(s): PHART, PCO2ART, PO2ART, HCO3, O2SAT in the last 168 hours.  Liver Function Tests: Recent Labs  Lab 07/27/20 0408 07/28/20 0334 07/29/20 0437  AST 46*  --  42*  ALT 68*  --  58*  ALKPHOS 270*  --  215*  BILITOT 1.1  --  0.9  PROT 6.7  --  6.1*  ALBUMIN 2.5* 2.3* 2.3*   No results for  input(s): LIPASE, AMYLASE in the last 168 hours. Recent Labs  Lab 07/25/20 0438 07/29/20 0437  AMMONIA 37* 28    CBC: Recent Labs  Lab 07/27/20 0408 07/28/20 0334  WBC 15.0* 14.4*  NEUTROABS 11.7*  --   HGB 13.1 11.4*  HCT 40.7 36.2  MCV 107.4* 108.4*  PLT 621* 531*    Cardiac Enzymes: No results for input(s): CKTOTAL, CKMB, CKMBINDEX, TROPONINI in the last 168 hours.  BNP (last 3 results) No results for input(s): BNP in the last 8760 hours.  ProBNP (last 3 results) No results for input(s): PROBNP in the last 8760 hours.  Radiological Exams: No results found.  Assessment/Plan Active Problems:   Acute on chronic respiratory failure with hypoxia (HCC)   Chronic diastolic CHF (congestive heart failure) (HCC)   Pulmonary fibrosis (HCC)   Cor pulmonale (chronic) (HCC)   Anoxic brain injury ([place])   Chronic pulmonary hypertension ([place])   1. Acute on chronic respiratory failure with hypoxia we will continue with capping trials slow wean with eventual goal of decannulation 2. Chronic diastolic heart failure compensated at this time we will continue to monitor cardiology is following the patient. 3. Pulmonary fibrosis supportive care cor pulmonale compensated 4.  Anoxic brain injury improved 5. Chronic pulmonary hypertension will need follow-up after discharge   I have personally seen and evaluated the patient, evaluated laboratory and imaging results, formulated the assessment and plan and placed orders. The Patient requires high complexity decision making with multiple systems involvement.  Rounds were done with the Respiratory Therapy Director and Staff therapists and discussed with nursing staff also.  Allyne Gee, MD [facility] Pulmonary Critical Care Medicine Sleep Medicine

## 2020-07-31 NOTE — Progress Notes (Signed)
Pulmonary Critical Care Medicine [place]   PULMONARY CRITICAL CARE SERVICE  PROGRESS NOTE  Date of Service: 07/31/2020  Carolyn Proctor  IOX:[id number]  DOB: 1960/12/22   DOA: 07/26/2020  Referring Physician: Merton Border, MD  HPI: Carolyn Proctor is a 59 y.o. female seen for follow up of Acute on Chronic Respiratory Failure.  Patient is capping has been on 2 L  Medications: Reviewed on Rounds  Physical Exam:  Vitals: Temperature is 96.0 pulse 86 respiratory rate 16 blood pressure is 120/76 saturations 100%  Ventilator Settings currently is capping on 2 L of oxygen  . General: Comfortable at this time . Eyes: Grossly normal lids, irises & conjunctiva . ENT: grossly tongue is normal . Neck: no obvious mass . Cardiovascular: S1 S2 normal no gallop . Respiratory: No rhonchi no rales are noted at this time . Abdomen: soft . Skin: no rash seen on limited exam . Musculoskeletal: not rigid . Psychiatric:unable to assess . Neurologic: no seizure no involuntary movements         Lab Data:   Basic Metabolic Panel: Recent Labs  Lab 07/25/20 0437 07/26/20 0500 07/27/20 0408 07/28/20 0334 07/29/20 0437  NA 133* 132* 132* 131* 134*  K 5.3* 4.9 5.0 4.9 4.9  CL 98 97* 98 98 97*  CO2 25 27 25 24 25   GLUCOSE 159* 127* 146* 107* 114*  BUN 46* 44* 44* 46* 46*  CREATININE 1.11* 1.09* 1.00 1.01* 1.03*  CALCIUM 8.7* 8.7* 8.9 8.8* 8.7*  MG  --   --  2.1  --   --   PHOS  --   --  4.8* 4.1  --     ABG: No results for input(s): PHART, PCO2ART, PO2ART, HCO3, O2SAT in the last 168 hours.  Liver Function Tests: Recent Labs  Lab 07/27/20 0408 07/28/20 0334 07/29/20 0437  AST 46*  --  42*  ALT 68*  --  58*  ALKPHOS 270*  --  215*  BILITOT 1.1  --  0.9  PROT 6.7  --  6.1*  ALBUMIN 2.5* 2.3* 2.3*   No results for input(s): LIPASE, AMYLASE in the last 168 hours. Recent Labs  Lab 07/25/20 0438 07/29/20 0437  AMMONIA 37* 28    CBC: Recent Labs   Lab 07/27/20 0408 07/28/20 0334  WBC 15.0* 14.4*  NEUTROABS 11.7*  --   HGB 13.1 11.4*  HCT 40.7 36.2  MCV 107.4* 108.4*  PLT 621* 531*    Cardiac Enzymes: No results for input(s): CKTOTAL, CKMB, CKMBINDEX, TROPONINI in the last 168 hours.  BNP (last 3 results) No results for input(s): BNP in the last 8760 hours.  ProBNP (last 3 results) No results for input(s): PROBNP in the last 8760 hours.  Radiological Exams: No results found.  Assessment/Plan Active Problems:   Acute on chronic respiratory failure with hypoxia (HCC)   Chronic diastolic CHF (congestive heart failure) (HCC)   Pulmonary fibrosis (HCC)   Cor pulmonale (chronic) (HCC)   Anoxic brain injury ([place])   Chronic pulmonary hypertension ([place])   1. Acute on chronic respiratory failure hypoxia we will continue with capping trials today will be 72 hours 2. Pulmonary fibrosis no change we will continue with supportive care. 3. Chronic diastolic heart failure being followed by cardiology 4. Cor pulmonale appears compensated 5. Anoxic brain injury supportive care no change 6. Chronic pulmonary hypertension at baseline   I have personally seen and evaluated the patient, evaluated laboratory and imaging results, formulated the  assessment and plan and placed orders. The Patient requires high complexity decision making with multiple systems involvement.  Rounds were done with the Respiratory Therapy Director and Staff therapists and discussed with nursing staff also.  Allyne Gee, MD [facility] Pulmonary Critical Care Medicine Sleep Medicine

## 2020-08-01 LAB — BASIC METABOLIC PANEL
Anion gap: 9 (ref 5–15)
BUN: 41 mg/dL — ABNORMAL HIGH (ref 6–20)
CO2: 27 mmol/L (ref 22–32)
Calcium: 8.8 mg/dL — ABNORMAL LOW (ref 8.9–10.3)
Chloride: 96 mmol/L — ABNORMAL LOW (ref 98–111)
Creatinine, Ser: 0.89 mg/dL (ref 0.44–1.00)
GFR, Estimated: >60 mL/min (ref >60)
Glucose, Bld: 111 mg/dL — ABNORMAL HIGH (ref 70–99)
Potassium: 6.3 mmol/L (ref 3.5–5.1)
Sodium: 132 mmol/L — ABNORMAL LOW (ref 135–145)

## 2020-08-01 LAB — CBC
HCT: 38.5 % (ref 36.0–46.0)
Hemoglobin: 11.9 g/dL — ABNORMAL LOW (ref 12.0–15.0)
MCH: 33.4 pg (ref 26.0–34.0)
MCHC: 30.9 g/dL (ref 30.0–36.0)
MCV: 108.1 fL — ABNORMAL HIGH (ref 80.0–100.0)
Platelets: 431 10*3/uL — ABNORMAL HIGH (ref 150–400)
RBC: 3.56 MIL/uL — ABNORMAL LOW (ref 3.87–5.11)
RDW: 19.9 % — ABNORMAL HIGH (ref 11.5–15.5)
WBC: 12.4 10*3/uL — ABNORMAL HIGH (ref 4.0–10.5)
nRBC: 0 % (ref 0.0–0.2)

## 2020-08-01 LAB — MAGNESIUM: Magnesium: 1.9 mg/dL (ref 1.7–2.4)

## 2020-08-01 LAB — VITAMIN B12: Vitamin B-12: 787 pg/mL (ref 180–914)

## 2020-08-01 LAB — FOLATE: Folate: 16.3 ng/mL (ref >5.9)

## 2020-08-01 LAB — POTASSIUM: Potassium: 4.4 mmol/L (ref 3.5–5.1)

## 2020-08-01 LAB — LEVETIRACETAM LEVEL: Levetiracetam Lvl: 31.2 ug/mL (ref 10.0–40.0)

## 2020-08-01 NOTE — Progress Notes (Signed)
Pulmonary Critical Care Medicine [place]   PULMONARY CRITICAL CARE SERVICE  PROGRESS NOTE  Date of Service: 08/01/2020  LYNLEIGH KOVACK  TDV:[id number]  DOB: 05/07/1961   DOA: 07/26/2020  Referring Physician: Merton Border, MD  HPI: Carolyn Proctor is a 59 y.o. female seen for follow up of Acute on Chronic Respiratory Failure.  Patient is doing well has been capping on 1 L of oxygen for several days now ready for decannulation.  Medications: Reviewed on Rounds  Physical Exam:  Vitals: Temperature 96.0 pulse 90 respiratory rate is 15 blood pressure is 128/82 saturations 100%  Ventilator Settings capping on 1 L oxygen  . General: Comfortable at this time . Eyes: Grossly normal lids, irises & conjunctiva . ENT: grossly tongue is normal . Neck: no obvious mass . Cardiovascular: S1 S2 normal no gallop . Respiratory: No rhonchi no rales are noted at this time . Abdomen: soft . Skin: no rash seen on limited exam . Musculoskeletal: not rigid . Psychiatric:unable to assess . Neurologic: no seizure no involuntary movements         Lab Data:   Basic Metabolic Panel: Recent Labs  Lab 07/26/20 0500 07/26/20 0500 07/27/20 0408 07/28/20 0334 07/29/20 0437 08/01/20 0744 08/01/20 1142  NA 132*  --  132* 131* 134* 132*  --   K 4.9   < > 5.0 4.9 4.9 6.3* 4.4  CL 97*  --  98 98 97* 96*  --   CO2 27  --  25 24 25 27   --   GLUCOSE 127*  --  146* 107* 114* 111*  --   BUN 44*  --  44* 46* 46* 41*  --   CREATININE 1.09*  --  1.00 1.01* 1.03* 0.89  --   CALCIUM 8.7*  --  8.9 8.8* 8.7* 8.8*  --   MG  --   --  2.1  --   --  1.9  --   PHOS  --   --  4.8* 4.1  --   --   --    < > = values in this interval not displayed.    ABG: No results for input(s): PHART, PCO2ART, PO2ART, HCO3, O2SAT in the last 168 hours.  Liver Function Tests: Recent Labs  Lab 07/27/20 0408 07/28/20 0334 07/29/20 0437  AST 46*  --  42*  ALT 68*  --  58*  ALKPHOS 270*  --  215*   BILITOT 1.1  --  0.9  PROT 6.7  --  6.1*  ALBUMIN 2.5* 2.3* 2.3*   No results for input(s): LIPASE, AMYLASE in the last 168 hours. Recent Labs  Lab 07/29/20 0437  AMMONIA 28    CBC: Recent Labs  Lab 07/27/20 0408 07/28/20 0334 08/01/20 0744  WBC 15.0* 14.4* 12.4*  NEUTROABS 11.7*  --   --   HGB 13.1 11.4* 11.9*  HCT 40.7 36.2 38.5  MCV 107.4* 108.4* 108.1*  PLT 621* 531* 431*    Cardiac Enzymes: No results for input(s): CKTOTAL, CKMB, CKMBINDEX, TROPONINI in the last 168 hours.  BNP (last 3 results) No results for input(s): BNP in the last 8760 hours.  ProBNP (last 3 results) No results for input(s): PROBNP in the last 8760 hours.  Radiological Exams: No results found.  Assessment/Plan Active Problems:   Acute on chronic respiratory failure with hypoxia (HCC)   Chronic diastolic CHF (congestive heart failure) (HCC)   Pulmonary fibrosis (HCC)   Cor pulmonale (chronic) (HCC)  Anoxic brain injury ([place])   Chronic pulmonary hypertension ([place])   1. Acute on chronic respiratory failure with hypoxia we will continue with the weaning process and proceed to decannulation.  Patient will need supplemental oxygen from 1 to 2 L. 2. Chronic diastolic heart failure compensated we will continue to follow 3. Pulmonary fibrosis will follow-up after discharge 4. Cor pulmonale no change continue with supportive care 5. Anoxic brain injury improved 6. Chronic pulmonary hypertension she is at baseline we will follow up with her primary pulmonary team.   I have personally seen and evaluated the patient, evaluated laboratory and imaging results, formulated the assessment and plan and placed orders. The Patient requires high complexity decision making with multiple systems involvement.  Rounds were done with the Respiratory Therapy Director and Staff therapists and discussed with nursing staff also.  Allyne Gee, MD [facility] Pulmonary Critical Care Medicine Sleep Medicine

## 2020-08-02 LAB — MAGNESIUM: Magnesium: 1.7 mg/dL (ref 1.7–2.4)

## 2020-08-02 LAB — AMMONIA: Ammonia: 36 umol/L — ABNORMAL HIGH (ref 9–35)

## 2020-08-02 LAB — CBC
HCT: 39.5 % (ref 36.0–46.0)
Hemoglobin: 12.5 g/dL (ref 12.0–15.0)
MCH: 33.2 pg (ref 26.0–34.0)
MCHC: 31.6 g/dL (ref 30.0–36.0)
MCV: 105.1 fL — ABNORMAL HIGH (ref 80.0–100.0)
Platelets: 440 10*3/uL — ABNORMAL HIGH (ref 150–400)
RBC: 3.76 MIL/uL — ABNORMAL LOW (ref 3.87–5.11)
RDW: 19.9 % — ABNORMAL HIGH (ref 11.5–15.5)
WBC: 10.8 10*3/uL — ABNORMAL HIGH (ref 4.0–10.5)
nRBC: 0 % (ref 0.0–0.2)

## 2020-08-02 LAB — BASIC METABOLIC PANEL
Anion gap: 11 (ref 5–15)
BUN: 37 mg/dL — ABNORMAL HIGH (ref 6–20)
CO2: 28 mmol/L (ref 22–32)
Calcium: 9 mg/dL (ref 8.9–10.3)
Chloride: 95 mmol/L — ABNORMAL LOW (ref 98–111)
Creatinine, Ser: 0.78 mg/dL (ref 0.44–1.00)
GFR, Estimated: >60 mL/min (ref >60)
Glucose, Bld: 111 mg/dL — ABNORMAL HIGH (ref 70–99)
Potassium: 4.3 mmol/L (ref 3.5–5.1)
Sodium: 134 mmol/L — ABNORMAL LOW (ref 135–145)

## 2020-08-02 LAB — PHOSPHORUS: Phosphorus: 4.1 mg/dL (ref 2.5–4.6)

## 2020-08-03 ENCOUNTER — Other Ambulatory Visit (HOSPITAL_COMMUNITY): Payer: BC Managed Care – PPO

## 2020-08-03 LAB — URINALYSIS, ROUTINE W REFLEX MICROSCOPIC
Bilirubin Urine: NEGATIVE
Glucose, UA: NEGATIVE mg/dL
Ketones, ur: NEGATIVE mg/dL
Nitrite: NEGATIVE
Protein, ur: 30 mg/dL — AB
Specific Gravity, Urine: 1.02 (ref 1.005–1.030)
pH: 5.5 (ref 5.0–8.0)

## 2020-08-03 LAB — URINALYSIS, MICROSCOPIC (REFLEX)

## 2020-08-03 LAB — MAGNESIUM: Magnesium: 1.9 mg/dL (ref 1.7–2.4)

## 2020-08-04 LAB — MAGNESIUM: Magnesium: 1.9 mg/dL (ref 1.7–2.4)

## 2020-08-04 LAB — CBC
HCT: 42.1 % (ref 36.0–46.0)
Hemoglobin: 13.5 g/dL (ref 12.0–15.0)
MCH: 33.9 pg (ref 26.0–34.0)
MCHC: 32.1 g/dL (ref 30.0–36.0)
MCV: 105.8 fL — ABNORMAL HIGH (ref 80.0–100.0)
Platelets: 430 10*3/uL — ABNORMAL HIGH (ref 150–400)
RBC: 3.98 MIL/uL (ref 3.87–5.11)
RDW: 19.2 % — ABNORMAL HIGH (ref 11.5–15.5)
WBC: 25.4 10*3/uL — ABNORMAL HIGH (ref 4.0–10.5)
nRBC: 0 % (ref 0.0–0.2)

## 2020-08-04 LAB — RENAL FUNCTION PANEL
Albumin: 2.4 g/dL — ABNORMAL LOW (ref 3.5–5.0)
Anion gap: 13 (ref 5–15)
BUN: 47 mg/dL — ABNORMAL HIGH (ref 6–20)
CO2: 25 mmol/L (ref 22–32)
Calcium: 9.4 mg/dL (ref 8.9–10.3)
Chloride: 94 mmol/L — ABNORMAL LOW (ref 98–111)
Creatinine, Ser: 1.12 mg/dL — ABNORMAL HIGH (ref 0.44–1.00)
GFR, Estimated: 57 mL/min — ABNORMAL LOW (ref >60)
Glucose, Bld: 192 mg/dL — ABNORMAL HIGH (ref 70–99)
Phosphorus: 4.2 mg/dL (ref 2.5–4.6)
Potassium: 4.4 mmol/L (ref 3.5–5.1)
Sodium: 132 mmol/L — ABNORMAL LOW (ref 135–145)

## 2020-08-04 LAB — AMMONIA: Ammonia: 25 umol/L (ref 9–35)

## 2020-08-05 LAB — RENAL FUNCTION PANEL
Albumin: 2 g/dL — ABNORMAL LOW (ref 3.5–5.0)
Anion gap: 10 (ref 5–15)
BUN: 65 mg/dL — ABNORMAL HIGH (ref 6–20)
CO2: 26 mmol/L (ref 22–32)
Calcium: 9.3 mg/dL (ref 8.9–10.3)
Chloride: 98 mmol/L (ref 98–111)
Creatinine, Ser: 1.03 mg/dL — ABNORMAL HIGH (ref 0.44–1.00)
GFR, Estimated: >60 mL/min (ref >60)
Glucose, Bld: 169 mg/dL — ABNORMAL HIGH (ref 70–99)
Phosphorus: 3.6 mg/dL (ref 2.5–4.6)
Potassium: 4.8 mmol/L (ref 3.5–5.1)
Sodium: 134 mmol/L — ABNORMAL LOW (ref 135–145)

## 2020-08-05 LAB — CULTURE, RESPIRATORY W GRAM STAIN

## 2020-08-05 LAB — URINE CULTURE: Culture: >=100000 — AB

## 2020-08-05 LAB — MAGNESIUM: Magnesium: 2 mg/dL (ref 1.7–2.4)

## 2020-08-05 LAB — CBC
HCT: 35.4 % — ABNORMAL LOW (ref 36.0–46.0)
Hemoglobin: 11.1 g/dL — ABNORMAL LOW (ref 12.0–15.0)
MCH: 33.1 pg (ref 26.0–34.0)
MCHC: 31.4 g/dL (ref 30.0–36.0)
MCV: 105.7 fL — ABNORMAL HIGH (ref 80.0–100.0)
Platelets: 359 10*3/uL (ref 150–400)
RBC: 3.35 MIL/uL — ABNORMAL LOW (ref 3.87–5.11)
RDW: 18.6 % — ABNORMAL HIGH (ref 11.5–15.5)
WBC: 17.7 10*3/uL — ABNORMAL HIGH (ref 4.0–10.5)
nRBC: 0.1 % (ref 0.0–0.2)

## 2020-08-06 LAB — CBC
HCT: 36.8 % (ref 36.0–46.0)
Hemoglobin: 11.4 g/dL — ABNORMAL LOW (ref 12.0–15.0)
MCH: 33.3 pg (ref 26.0–34.0)
MCHC: 31 g/dL (ref 30.0–36.0)
MCV: 107.6 fL — ABNORMAL HIGH (ref 80.0–100.0)
Platelets: 339 10*3/uL (ref 150–400)
RBC: 3.42 MIL/uL — ABNORMAL LOW (ref 3.87–5.11)
RDW: 18.9 % — ABNORMAL HIGH (ref 11.5–15.5)
WBC: 16.2 10*3/uL — ABNORMAL HIGH (ref 4.0–10.5)
nRBC: 0.2 % (ref 0.0–0.2)

## 2020-08-06 LAB — RENAL FUNCTION PANEL
Albumin: 2 g/dL — ABNORMAL LOW (ref 3.5–5.0)
Anion gap: 14 (ref 5–15)
BUN: 69 mg/dL — ABNORMAL HIGH (ref 6–20)
CO2: 25 mmol/L (ref 22–32)
Calcium: 8.9 mg/dL (ref 8.9–10.3)
Chloride: 97 mmol/L — ABNORMAL LOW (ref 98–111)
Creatinine, Ser: 0.8 mg/dL (ref 0.44–1.00)
GFR, Estimated: >60 mL/min (ref >60)
Glucose, Bld: 169 mg/dL — ABNORMAL HIGH (ref 70–99)
Phosphorus: 2.8 mg/dL (ref 2.5–4.6)
Potassium: 5.3 mmol/L — ABNORMAL HIGH (ref 3.5–5.1)
Sodium: 136 mmol/L (ref 135–145)

## 2020-08-06 LAB — MAGNESIUM: Magnesium: 2.1 mg/dL (ref 1.7–2.4)

## 2020-08-06 LAB — VANCOMYCIN, TROUGH: Vancomycin Tr: 13 ug/mL — ABNORMAL LOW (ref 15–20)

## 2020-08-07 LAB — BASIC METABOLIC PANEL
Anion gap: 14 (ref 5–15)
BUN: 59 mg/dL — ABNORMAL HIGH (ref 6–20)
CO2: 26 mmol/L (ref 22–32)
Calcium: 9.2 mg/dL (ref 8.9–10.3)
Chloride: 100 mmol/L (ref 98–111)
Creatinine, Ser: 0.71 mg/dL (ref 0.44–1.00)
GFR, Estimated: >60 mL/min (ref >60)
Glucose, Bld: 161 mg/dL — ABNORMAL HIGH (ref 70–99)
Potassium: 3.8 mmol/L (ref 3.5–5.1)
Sodium: 140 mmol/L (ref 135–145)

## 2020-08-07 LAB — MAGNESIUM: Magnesium: 1.9 mg/dL (ref 1.7–2.4)

## 2020-08-07 LAB — VANCOMYCIN, TROUGH: Vancomycin Tr: 15 ug/mL (ref 15–20)

## 2020-08-08 LAB — CULTURE, BLOOD (ROUTINE X 2)
Culture: NO GROWTH
Culture: NO GROWTH

## 2020-08-08 LAB — VANCOMYCIN, TROUGH: Vancomycin Tr: 14 ug/mL — ABNORMAL LOW (ref 15–20)

## 2020-08-10 LAB — BASIC METABOLIC PANEL
Anion gap: 13 (ref 5–15)
BUN: 63 mg/dL — ABNORMAL HIGH (ref 6–20)
CO2: 26 mmol/L (ref 22–32)
Calcium: 9 mg/dL (ref 8.9–10.3)
Chloride: 103 mmol/L (ref 98–111)
Creatinine, Ser: 0.77 mg/dL (ref 0.44–1.00)
GFR, Estimated: >60 mL/min (ref >60)
Glucose, Bld: 159 mg/dL — ABNORMAL HIGH (ref 70–99)
Potassium: 5.2 mmol/L — ABNORMAL HIGH (ref 3.5–5.1)
Sodium: 142 mmol/L (ref 135–145)

## 2020-08-10 LAB — CBC
HCT: 39.7 % (ref 36.0–46.0)
Hemoglobin: 12.1 g/dL (ref 12.0–15.0)
MCH: 33.8 pg (ref 26.0–34.0)
MCHC: 30.5 g/dL (ref 30.0–36.0)
MCV: 110.9 fL — ABNORMAL HIGH (ref 80.0–100.0)
Platelets: 428 10*3/uL — ABNORMAL HIGH (ref 150–400)
RBC: 3.58 MIL/uL — ABNORMAL LOW (ref 3.87–5.11)
RDW: 21 % — ABNORMAL HIGH (ref 11.5–15.5)
WBC: 11.5 10*3/uL — ABNORMAL HIGH (ref 4.0–10.5)
nRBC: 8.3 % — ABNORMAL HIGH (ref 0.0–0.2)

## 2020-08-10 LAB — MAGNESIUM: Magnesium: 2.2 mg/dL (ref 1.7–2.4)

## 2020-08-10 LAB — PATHOLOGIST SMEAR REVIEW: Path Review: INCREASED

## 2020-08-11 ENCOUNTER — Other Ambulatory Visit (HOSPITAL_COMMUNITY): Payer: BC Managed Care – PPO

## 2020-08-11 LAB — BASIC METABOLIC PANEL
Anion gap: 9 (ref 5–15)
BUN: 60 mg/dL — ABNORMAL HIGH (ref 6–20)
CO2: 29 mmol/L (ref 22–32)
Calcium: 9 mg/dL (ref 8.9–10.3)
Chloride: 107 mmol/L (ref 98–111)
Creatinine, Ser: 0.79 mg/dL (ref 0.44–1.00)
GFR, Estimated: >60 mL/min (ref >60)
Glucose, Bld: 196 mg/dL — ABNORMAL HIGH (ref 70–99)
Potassium: 4.5 mmol/L (ref 3.5–5.1)
Sodium: 145 mmol/L (ref 135–145)

## 2020-08-11 LAB — BLOOD GAS, ARTERIAL
Acid-Base Excess: 2.3 mmol/L — ABNORMAL HIGH (ref 0.0–2.0)
Bicarbonate: 25.7 mmol/L (ref 20.0–28.0)
FIO2: 32
O2 Saturation: 83.7 %
Patient temperature: 36.8
pCO2 arterial: 35.2 mmHg (ref 32.0–48.0)
pH, Arterial: 7.476 — ABNORMAL HIGH (ref 7.350–7.450)
pO2, Arterial: 50.4 mmHg — ABNORMAL LOW (ref 83.0–108.0)

## 2020-08-12 ENCOUNTER — Other Ambulatory Visit (HOSPITAL_COMMUNITY): Payer: BC Managed Care – PPO

## 2020-08-12 LAB — LEVETIRACETAM LEVEL
Levetiracetam Lvl: 43.7 ug/mL — ABNORMAL HIGH (ref 10.0–40.0)
Levetiracetam Lvl: 27.8 ug/mL (ref 10.0–40.0)

## 2020-08-13 ENCOUNTER — Other Ambulatory Visit (HOSPITAL_COMMUNITY): Payer: BC Managed Care – PPO

## 2020-08-13 LAB — CBC
HCT: 35.3 % — ABNORMAL LOW (ref 36.0–46.0)
Hemoglobin: 10.4 g/dL — ABNORMAL LOW (ref 12.0–15.0)
MCH: 33.2 pg (ref 26.0–34.0)
MCHC: 29.5 g/dL — ABNORMAL LOW (ref 30.0–36.0)
MCV: 112.8 fL — ABNORMAL HIGH (ref 80.0–100.0)
Platelets: 451 10*3/uL — ABNORMAL HIGH (ref 150–400)
RBC: 3.13 MIL/uL — ABNORMAL LOW (ref 3.87–5.11)
RDW: 22.4 % — ABNORMAL HIGH (ref 11.5–15.5)
WBC: 19.5 10*3/uL — ABNORMAL HIGH (ref 4.0–10.5)
nRBC: 7.1 % — ABNORMAL HIGH (ref 0.0–0.2)

## 2020-08-14 LAB — URINALYSIS, ROUTINE W REFLEX MICROSCOPIC
Bilirubin Urine: NEGATIVE
Glucose, UA: NEGATIVE mg/dL
Hgb urine dipstick: NEGATIVE
Ketones, ur: NEGATIVE mg/dL
Leukocytes,Ua: NEGATIVE
Nitrite: NEGATIVE
Protein, ur: 100 mg/dL — AB
Specific Gravity, Urine: 1.023 (ref 1.005–1.030)
pH: 6 (ref 5.0–8.0)

## 2020-08-14 LAB — RENAL FUNCTION PANEL
Albumin: 2.3 g/dL — ABNORMAL LOW (ref 3.5–5.0)
Anion gap: 11 (ref 5–15)
BUN: 69 mg/dL — ABNORMAL HIGH (ref 6–20)
CO2: 26 mmol/L (ref 22–32)
Calcium: 9.2 mg/dL (ref 8.9–10.3)
Chloride: 111 mmol/L (ref 98–111)
Creatinine, Ser: 0.77 mg/dL (ref 0.44–1.00)
GFR, Estimated: >60 mL/min (ref >60)
Glucose, Bld: 178 mg/dL — ABNORMAL HIGH (ref 70–99)
Phosphorus: 4.1 mg/dL (ref 2.5–4.6)
Potassium: 4.8 mmol/L (ref 3.5–5.1)
Sodium: 148 mmol/L — ABNORMAL HIGH (ref 135–145)

## 2020-08-14 LAB — CBC
HCT: 37.6 % (ref 36.0–46.0)
Hemoglobin: 11 g/dL — ABNORMAL LOW (ref 12.0–15.0)
MCH: 33.6 pg (ref 26.0–34.0)
MCHC: 29.3 g/dL — ABNORMAL LOW (ref 30.0–36.0)
MCV: 115 fL — ABNORMAL HIGH (ref 80.0–100.0)
Platelets: 427 10*3/uL — ABNORMAL HIGH (ref 150–400)
RBC: 3.27 MIL/uL — ABNORMAL LOW (ref 3.87–5.11)
RDW: 22.8 % — ABNORMAL HIGH (ref 11.5–15.5)
WBC: 15.4 10*3/uL — ABNORMAL HIGH (ref 4.0–10.5)
nRBC: 5.1 % — ABNORMAL HIGH (ref 0.0–0.2)

## 2020-08-14 LAB — MAGNESIUM: Magnesium: 2.5 mg/dL — ABNORMAL HIGH (ref 1.7–2.4)

## 2020-08-15 LAB — URINE CULTURE: Culture: NO GROWTH

## 2020-08-15 LAB — CBC
HCT: 39.9 % (ref 36.0–46.0)
Hemoglobin: 11.7 g/dL — ABNORMAL LOW (ref 12.0–15.0)
MCH: 34 pg (ref 26.0–34.0)
MCHC: 29.3 g/dL — ABNORMAL LOW (ref 30.0–36.0)
MCV: 116 fL — ABNORMAL HIGH (ref 80.0–100.0)
Platelets: 410 10*3/uL — ABNORMAL HIGH (ref 150–400)
RBC: 3.44 MIL/uL — ABNORMAL LOW (ref 3.87–5.11)
RDW: 23.7 % — ABNORMAL HIGH (ref 11.5–15.5)
WBC: 11.3 10*3/uL — ABNORMAL HIGH (ref 4.0–10.5)
nRBC: 5.8 % — ABNORMAL HIGH (ref 0.0–0.2)

## 2020-08-15 LAB — PROTIME-INR
INR: 1.6 — ABNORMAL HIGH (ref 0.8–1.2)
Prothrombin Time: 18.9 seconds — ABNORMAL HIGH (ref 11.4–15.2)

## 2020-08-15 LAB — EXPECTORATED SPUTUM ASSESSMENT W GRAM STAIN, RFLX TO RESP C

## 2020-08-15 LAB — RENAL FUNCTION PANEL
Albumin: 2.2 g/dL — ABNORMAL LOW (ref 3.5–5.0)
Anion gap: 10 (ref 5–15)
BUN: 69 mg/dL — ABNORMAL HIGH (ref 6–20)
CO2: 26 mmol/L (ref 22–32)
Calcium: 9 mg/dL (ref 8.9–10.3)
Chloride: 111 mmol/L (ref 98–111)
Creatinine, Ser: 0.8 mg/dL (ref 0.44–1.00)
GFR, Estimated: >60 mL/min (ref >60)
Glucose, Bld: 179 mg/dL — ABNORMAL HIGH (ref 70–99)
Phosphorus: 4.2 mg/dL (ref 2.5–4.6)
Potassium: 5.2 mmol/L — ABNORMAL HIGH (ref 3.5–5.1)
Sodium: 147 mmol/L — ABNORMAL HIGH (ref 135–145)

## 2020-08-15 LAB — MAGNESIUM: Magnesium: 2.6 mg/dL — ABNORMAL HIGH (ref 1.7–2.4)

## 2020-08-15 NOTE — Consult Note (Signed)
Chief Complaint: Patient was seen in consultation today for dysphagia/percutaneous gastrostomy tube placement.  Referring Physician(s): Janora Norlander [facility])  Supervising Physician: Aletta Edouard  Patient Status: SSH-inpt  History of Present Illness: Carolyn Proctor is a 59 y.o. female with a past medical history of hypertension, atrial fibrillation on chronic anticoagulation with Eliquis, atrial tachycardia, HF, pulmonary hypertension with cor pulmonale, anoxic brain injury, seizures, rheumatoid lung disease, pulmonary fibrosis, diverticulosis, lupus, and RA. She has been admitted to [facility] since 07/26/2020 for a variety of problems including acute hypoxic failure, acute metabolic encephalopathy, suspected anoxic brain injury, and protein calorie malnutrition. She currently has NG in place for nutrition.  IR consulted by Janora Norlander, NP for possible image-guided percutaneous gastrostomy tube placement. Patient sitting in bed resting comfortably. Appears lethargic- opens eyes briefly to voice and quickly falls asleep. No complaints. Family friend at bedside. History difficult to obtain secondary to lethargy.  LD Eliquis today.   Past Medical History:  Diagnosis Date  . Acute myopericarditis 02/24/2013  . Acute on chronic respiratory failure with hypoxia ([place])   . Anoxic brain injury ([place])   . Atrial fibrillation ([place])   . Atrial fibrillation with RVR ([place]) 02/22/2013  . Chronic anticoagulation, Xarelto 04/02/2013  . Chronic diastolic CHF (congestive heart failure) ([place]) 06/26/2019  . Chronic pulmonary hypertension ([place])   . Cor pulmonale (chronic) ([place])   . Diverticulitis   . Elevated troponin- secondary to Myopericarditis 02/25/2013  . Emphysema  07/07/2019  . Hypertension   . Lupus ([place]) 09/14/2011  . Multifocal atrial tachycardia ([place]) 02/22/2013  . PAF (paroxysmal atrial fibrillation) ([place]) 04/02/2013  . Pleuritic chest pain 02/22/2013  . Pulmonary fibrosis ([place])   . RA-ILD  with NSIP pattern on CT  chest 2014 ---> progressive fibrosis on 2020 CT  02/22/2013   Associated with collagen vascular disorder   . 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 Rheumatoid arthritis ([place]) 07/07/2019  . Rheumatoid lung disease ([place]) 06/23/2019  . Right ventricular dysfunction 08/05/2019  . Thrombocytosis 09/14/2011    Past Surgical History:  Procedure Laterality Date  . BONE MARROW BIOPSY  2010  . CARDIAC CATHETERIZATION    . LEFT HEART CATHETERIZATION WITH CORONARY ANGIOGRAM N/A 02/24/2013   Procedure: LEFT HEART CATHETERIZATION WITH CORONARY ANGIOGRAM;  Surgeon: Leonie Man, MD;  Location: [facility] CATH LAB;  Service: Cardiovascular;  Laterality: N/A;  . RIGHT/LEFT HEART CATH AND CORONARY ANGIOGRAPHY N/A 09/09/2019   Procedure: RIGHT/LEFT HEART CATH AND CORONARY ANGIOGRAPHY;  Surgeon: Jettie Booze, MD;  Location: [facility] CV LAB;  Service: Cardiovascular;  Laterality: N/A;    Allergies: Patient has no known allergies.  Medications: Prior to Admission medications   Medication Sig Start Date End Date Taking? Authorizing Provider  acetaminophen (TYLENOL) 160 MG/5ML solution Place 20.3 mLs (650 mg total) into feeding tube every 6 (six) hours as needed for mild pain, headache or fever. 07/26/20   Samella Parr, NP  amiodarone (PACERONE) 200 MG tablet Place 1 tablet (200 mg total) into feeding tube daily. 07/26/20   Samella Parr, NP  apixaban (ELIQUIS) 5 MG TABS tablet Place 1 tablet (5 mg total) into feeding tube 2 (two) times daily. 07/26/20   Samella Parr, NP  Calcium Carbonate-Vit D-Min (CALCIUM 1200 PO) Take 1 capsule by mouth daily.    [provider]  chlorhexidine gluconate, MEDLINE KIT, (PERIDEX) 0.12 % solution 15 mLs by Mouth Rinse route 2 (two) times daily. 07/26/20   Samella Parr, NP  Cholecalciferol (  VITAMIN D) 50 MCG (2000 UT) tablet Take 2,000 Units by mouth daily.     [provider]  diphenhydrAMINE-zinc acetate (BENADRYL) cream  Apply topically 2 (two) times daily as needed for itching. 07/26/20   Samella Parr, NP  fluticasone (FLONASE) 50 MCG/ACT nasal spray Place 1 spray into both nostrils daily as needed for allergies.  03/22/20   [provider]  furosemide (LASIX) 20 MG tablet Place 1 tablet (20 mg total) into feeding tube daily. 07/26/20   Samella Parr, NP  gabapentin (NEURONTIN) 300 MG capsule Take 300 mg by mouth 2 (two) times daily. 03/01/20   [provider]  insulin aspart (NOVOLOG) 100 UNIT/ML injection Inject 0-9 Units into the skin every 4 (four) hours. Correction coverage: Sensitive (thin, NPO, renal)  CBG < 70: Implement Hypoglycemia Standing Orders and refer to Hypoglycemia Standing Orders sidebar report  CBG 70 - 120: 0 units  CBG 121 - 150: 1 unit  CBG 151 - 200: 2 units  CBG 201 - 250: 3 units  CBG 251 - 300: 5 units  CBG 301 - 350: 7 units  CBG 351 - 400 9 units  CBG > 400 call MD and obtain STAT lab verification 07/26/20   Samella Parr, NP  ipratropium (ATROVENT) 0.02 % nebulizer solution Take 2.5 mLs (0.5 mg total) by nebulization every 6 (six) hours as needed for wheezing or shortness of breath (give with xopenex). 07/26/20   Samella Parr, NP  lactulose (CHRONULAC) 10 GM/15ML solution Place 15 mLs (10 g total) into feeding tube daily. 07/26/20   Samella Parr, NP  levalbuterol Penne Lash) 0.63 MG/3ML nebulizer solution Take 3 mLs (0.63 mg total) by nebulization every 6 (six) hours as needed for wheezing or shortness of breath (Give with atrovent). 07/26/20   Samella Parr, NP  levETIRAcetam (KEPPRA) 100 MG/ML solution Place 5 mLs (500 mg total) into feeding tube 2 (two) times daily. 07/26/20   Samella Parr, NP  melatonin 3 MG TABS tablet Place 1 tablet (3 mg total) into feeding tube at bedtime as needed (sleep). 07/26/20   Samella Parr, NP  morphine 2 MG/ML injection Inject 1 mL (2 mg total) into the vein every 4 (four) hours as needed. 07/26/20   Samella Parr, NP  nitroGLYCERIN (NITROSTAT) 0.4 MG SL tablet Place 1 tablet (0.4 mg total) under the tongue every 5 (five) minutes as needed for chest pain. 07/26/20   Samella Parr, NP  Nutritional Supplements (FEEDING SUPPLEMENT, OSMOLITE 1.5 CAL,) LIQD Place 1,000 mLs into feeding tube continuous. 07/26/20   Samella Parr, NP  Nutritional Supplements (FEEDING SUPPLEMENT, PROSOURCE TF,) liquid Place 45 mLs into feeding tube daily. 07/26/20   Samella Parr, NP  ondansetron (ZOFRAN) 4 MG/2ML SOLN injection Inject 2 mLs (4 mg total) into the vein every 6 (six) hours as needed for nausea or vomiting. 07/26/20   Samella Parr, NP  pantoprazole sodium (PROTONIX) 40 mg/20 mL PACK Place 20 mLs (40 mg total) into feeding tube daily. 07/26/20   Samella Parr, NP  polyethylene glycol (MIRALAX / GLYCOLAX) 17 g packet Place 17 g into feeding tube daily as needed for moderate constipation. 07/26/20   Samella Parr, NP  predniSONE (DELTASONE) 10 MG tablet Take 2 tablets (20 mg total) by mouth daily. 07/26/20   Samella Parr, NP  silver sulfADIAZINE (SILVADENE) 1 % cream Apply topically 2 (two) times daily. 07/26/20   Samella Parr,  NP  sodium chloride flush (NS) 0.9 % SOLN 10-40 mLs by Intracatheter route every 12 (twelve) hours. 07/26/20   Samella Parr, NP  sodium chloride flush (NS) 0.9 % SOLN 10-40 mLs by Intracatheter route as needed (flush). 07/26/20   Samella Parr, NP  white petrolatum (VASELINE) OINT Apply 1 application topically as needed for lip care. 07/26/20   Samella Parr, NP     Family History  Problem Relation Age of Onset  . Lupus Mother   . Liver disease Mother   . Hypertension Maternal Grandmother   . Lupus Brother     Social History   Socioeconomic History  . Marital status: Divorced    Spouse name: Not on file  . Number of children: Not on file  . Years of education: Not on file  . Highest education level: Not on file  Occupational History  . Not  on file  Tobacco Use  . Smoking status: Never Smoker  . Smokeless tobacco: Never Used  Substance and Sexual Activity  . Alcohol use: No  . Drug use: No  . Sexual activity: Never  Other Topics Concern  . Not on file  Social History Narrative  . Not on file   Social Determinants of Health   Financial Resource Strain:   . Difficulty of Paying Living Expenses: Not on file  Food Insecurity:   . Worried About Charity fundraiser in the Last Year: Not on file  . Ran Out of Food in the Last Year: Not on file  Transportation Needs:   . Lack of Transportation (Medical): Not on file  . Lack of Transportation (Non-Medical): Not on file  Physical Activity:   . Days of Exercise per Week: Not on file  . Minutes of Exercise per Session: Not on file  Stress:   . Feeling of Stress : Not on file  Social Connections:   . Frequency of Communication with Friends and Family: Not on file  . Frequency of Social Gatherings with Friends and Family: Not on file  . Attends Religious Services: Not on file  . Active Member of Clubs or Organizations: Not on file  . Attends Archivist Meetings: Not on file  . Marital Status: Not on file     Review of Systems: A 12 point ROS discussed and pertinent positives are indicated in the HPI above.  All other systems are negative.  Review of Systems  Reason unable to perform ROS: Lethargic.    Vital Signs: LMP 02/19/2013   Physical Exam Vitals and nursing note reviewed.  Constitutional:      General: She is not in acute distress.    Comments: Lethargic.  Cardiovascular:     Rate and Rhythm: Normal rate and regular rhythm.     Heart sounds: Normal heart sounds. No murmur heard.   Pulmonary:     Effort: Pulmonary effort is normal. No respiratory distress.     Breath sounds: Normal breath sounds. No wheezing.  Skin:    General: Skin is warm and dry.  Neurological:     Comments: Lethargic.      MD Evaluation Airway: WNL Heart:  WNL Abdomen: WNL Chest/ Lungs: WNL ASA  Classification: 3 Mallampati/Airway Score: Two   Imaging: CT ABDOMEN WO CONTRAST  Result Date: 08/12/2020 CLINICAL DATA:  Evaluate gastric anatomy for potential percutaneous gastrostomy tube placement. History of rheumatoid arthritis and interstitial lung disease. EXAM: CT ABDOMEN WITHOUT CONTRAST TECHNIQUE: Multidetector CT imaging of the abdomen was  performed following the standard protocol without IV contrast. COMPARISON:  CT abdomen and pelvis-06/22/2020 FINDINGS: The lack of intravenous contrast limits the ability to evaluate solid abdominal organs. Lower chest: Limited visualization of the lower thorax demonstrates resolution of previously noted bilateral pleural effusions. Improved aeration of the lung bases with persistent bibasilar reticular opacities and subpleural cysts involving both the lung bases as well as the nondependent portions of the imaged right middle lobe and lingula. No evidence of bronchiectasis. Minimal right basilar consolidative opacities with scattered minimal air bronchograms. Cardiomegaly.  No pericardial effusion. Hepatobiliary: Mild nodularity hepatic contour. Increased attenuation within the gallbladder may represent biliary sludge versus vicarious excretion from previous contrast examination (image 42, series 3). No ascites. Pancreas: Normal noncontrast appearance of the pancreas. Spleen: Normal noncontrast appearance of the spleen. Adrenals/Urinary Tract: Normal noncontrast appearance of the bilateral kidneys. No renal stones. No urine obstruction or perinephric stranding. Normal noncontrast appearance of the bilateral adrenal glands. The urinary bladder was not imaged. Stomach/Bowel: The anterior wall of the stomach is well apposed against the ventral wall of the upper abdomen without interposition of the hepatic parenchyma or transverse colon and will likely be improved with gastric insufflation. Enteric tube terminates within  the proximal aspect of the horizontal segment of the duodenum. Large colonic stool burden, primarily involving the cecum and ascending colon, without evidence of enteric obstruction. Colonic diverticulosis without evidence superimposed acute diverticulitis on this noncontrast examination. No pneumoperitoneum, pneumatosis or portal venous gas. Vascular/Lymphatic: Minimal amount of atherosclerotic plaque within normal caliber abdominal aorta. No bulky retroperitoneal or mesenteric lymphadenopathy on this noncontrast examination. Other: Diffuse body wall anasarca. Musculoskeletal: No acute or aggressive osseous abnormalities. Moderate scoliotic curvature of the thoracolumbar spine, potentially positional. IMPRESSION: 1. Gastric anatomy amenable to potential percutaneous gastrostomy tube placement as indicated. 2. Improved aeration of the lung bases with persistent bibasilar reticular opacities and subpleural cysts, nonspecific though compatible with history of pulmonary fibrosis associated with collagen vascular disease. 3. Cardiomegaly. 4. Colonic diverticulosis without evidence superimposed acute diverticulitis. 5. Large colonic stool burden without evidence of enteric obstruction. 6. Aortic atherosclerosis (ICD10-I70.0). Electronically Signed   By: Sandi Mariscal M.D.   On: 08/12/2020 07:53   DG CHEST PORT 1 VIEW  Result Date: 08/13/2020 CLINICAL DATA:  Follow-up pneumonia. EXAM: PORTABLE CHEST 1 VIEW COMPARISON:  08/12/2020 FINDINGS: Right arm PICC line tip is in the cavoatrial junction. There is a feeding tube with tip below the GE junction and field of view. Stable cardiac enlargement. Asymmetric elevation of right hemidiaphragm, unchanged. Diffuse peripheral and lower lung zone predominant interstitial reticulation is again noted compatible with chronic lung disease. Superimposed diffuse hazy interstitial opacities are noted bilaterally. Airspace opacification in the left lower lobe appears unchanged.  IMPRESSION: 1. No change in the appearance of diffuse bilateral hazy lung opacities. 2. No change in left lower lobe airspace opacification Electronically Signed   By: Kerby Moors M.D.   On: 08/13/2020 06:44   DG CHEST PORT 1 VIEW  Result Date: 08/12/2020 CLINICAL DATA:  Pneumonia. EXAM: PORTABLE CHEST 1 VIEW COMPARISON:  Chest x-ray dated August 03, 2020. FINDINGS: Unchanged right upper extremity PICC line and feeding tube. Unchanged cardiomegaly. Basilar predominant coarsened interstitial markings are similar to prior study. No focal consolidation, pleural effusion, or pneumothorax. No acute osseous abnormality. IMPRESSION: 1. No active disease. 2. Basilar predominant pulmonary fibrosis. Electronically Signed   By: Titus Dubin M.D.   On: 08/12/2020 15:59   DG CHEST PORT 1 VIEW  Result Date: 08/03/2020  CLINICAL DATA:  Aspiration. EXAM: PORTABLE CHEST 1 VIEW COMPARISON:  July 27, 2020. FINDINGS: Stable cardiomediastinal silhouette. No pneumothorax or pleural effusion is noted. Feeding tube is seen entering stomach. Right-sided PICC line is unchanged. Tracheostomy tube is not visualized currently. Mildly increased reticular densities are noted in both lung bases concerning for possible fibrosis, atypical inflammation or edema. Bony thorax is unremarkable. IMPRESSION: Mildly increased reticular densities are noted in both lung bases concerning for possible fibrosis, atypical inflammation or edema. Electronically Signed   By: Marijo Conception M.D.   On: 08/03/2020 14:18   DG CHEST PORT 1 VIEW  Result Date: 07/27/2020 CLINICAL DATA:  Respiratory failure EXAM: PORTABLE CHEST 1 VIEW COMPARISON:  07/22/2020 FINDINGS: Tracheostomy, enteric tube, and right PICC line are unchanged in position. Cardiac enlargement. Interstitial pattern with bronchiectasis likely representing fibrosis. No consolidation or effusion. No change since previous study. IMPRESSION: Stable appearance of the chest. Electronically  Signed   By: Lucienne Capers M.D.   On: 07/27/2020 06:42   DG CHEST PORT 1 VIEW  Result Date: 07/22/2020 CLINICAL DATA:  Tracheostomy present.  Difficulty breathing EXAM: PORTABLE CHEST 1 VIEW COMPARISON:  July 20, 2020 and July 04, 2020 FINDINGS: Tracheostomy catheter tip is 5.3 cm above carina. Enteric tube tip is below the diaphragm. Central catheter tip is in the superior vena cava near the cavoatrial junction. No pneumothorax. There is stable interstitial thickening mid lower lung regions, stable. No consolidation or airspace opacity. No pleural effusions. Heart is mildly enlarged, stable. Pulmonary vascularity is normal. No adenopathy. No bone lesions. IMPRESSION: Tube and catheter positions as described without pneumothorax. Probable fibrotic change in the lower lung regions. No frank edema or consolidation. Stable cardiac prominence. Electronically Signed   By: Lowella Grip III M.D.   On: 07/22/2020 12:18   DG CHEST PORT 1 VIEW  Result Date: 07/20/2020 CLINICAL DATA:  Tracheostomy. EXAM: PORTABLE CHEST 1 VIEW COMPARISON:  July 18, 2020. FINDINGS: Stable cardiomegaly. Tracheostomy and feeding tubes are unchanged in position. Right-sided PICC line is unchanged. No pneumothorax is noted. Minimal bibasilar subsegmental atelectasis is noted. No significant pleural effusion is noted. Bony thorax is unremarkable. IMPRESSION: Stable support apparatus. Minimal bibasilar subsegmental atelectasis. Electronically Signed   By: Marijo Conception M.D.   On: 07/20/2020 08:36   DG CHEST PORT 1 VIEW  Result Date: 07/18/2020 CLINICAL DATA:  Shortness of breath. Pneumonia. Additional history provided by technologist: Tracheostomy, pneumonia, respiratory arrest. EXAM: PORTABLE CHEST 1 VIEW COMPARISON:  Prior chest radiographs 07/17/2020 and earlier. FINDINGS: Unchanged position of a tracheostomy tube and right-sided PICC. An enteric tube passes below level of left hemidiaphragm with tip excluded  from the field of view. Unchanged cardiomegaly. Subtle new patchy airspace disease is questioned within the left mid to upper lung field. Stable background chronic interstitial prominence. No definite pleural effusion. No evidence of pneumothorax. No acute bony abnormality identified. IMPRESSION: Stable position of visible support apparatus as described. Subtle new patchy airspace disease is questioned within the left mid to upper lung field. Consider short interval radiographic follow-up. Unchanged cardiomegaly. Stable background chronic prominence of the interstitial lung markings. Electronically Signed   By: Kellie Simmering DO   On: 07/18/2020 08:18   DG CHEST PORT 1 VIEW  Result Date: 07/17/2020 CLINICAL DATA:  Shortness of breath. EXAM: PORTABLE CHEST 1 VIEW COMPARISON:  One-view chest x-ray 07/16/2020 FINDINGS: Right-sided PICC line is stable. Feeding tube courses off the inferior border the film. Tracheostomy tube is in place. The heart  is enlarged. Diffuse interstitial pattern is stable, likely chronic. No significant airspace consolidation is present. IMPRESSION: 1. Stable cardiomegaly without failure. 2. Stable chronic interstitial pattern. 3. Support apparatus is stable. Electronically Signed   By: San Morelle M.D.   On: 07/17/2020 12:21   DG Abd Portable 1V  Result Date: 07/26/2020 CLINICAL DATA:  Nasogastric tube placement. EXAM: PORTABLE ABDOMEN - 1 VIEW COMPARISON:  None. FINDINGS: A nasogastric tube is seen with its distal tip overlying the medial aspect of the right upper quadrant. This is likely looped within the body of the stomach. The bowel gas pattern is normal. A mild amount of retained contrast is seen within the large bowel. No radio-opaque calculi or other significant radiographic abnormality are seen. IMPRESSION: Nasogastric tube positioning, as described above. Electronically Signed   By: Virgina Norfolk M.D.   On: 07/26/2020 19:33   DG Swallowing Func-Speech  Pathology  Result Date: 07/19/2020 Objective Swallowing Evaluation: Type of Study: MBS-Modified Barium Swallow Study  Patient Details Name: Carolyn Proctor MRN: [id number] Date of Birth: 05-21-1961 Today's Date: 07/19/2020 Time: SLP Start Time (ACUTE ONLY): 1020 -SLP Stop Time (ACUTE ONLY): 1045 SLP Time Calculation (min) (ACUTE ONLY): 25 min Past Medical History: Past Medical History: Diagnosis Date . Acute myopericarditis 02/24/2013 . Atrial fibrillation ([place])  . Atrial fibrillation with RVR ([place]) 02/22/2013 . Chronic anticoagulation, Xarelto 04/02/2013 . Chronic diastolic CHF (congestive heart failure) ([place]) 06/26/2019 . Diverticulitis  . Elevated troponin- secondary to Myopericarditis 02/25/2013 . Emphysema  07/07/2019 . Hypertension  . Lupus ([place]) 09/14/2011 . Multifocal atrial tachycardia ([place]) 02/22/2013 . PAF (paroxysmal atrial fibrillation) ([place]) 04/02/2013 . Pleuritic chest pain 02/22/2013 . 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 Rheumatoid arthritis ([place]) 07/07/2019 . Rheumatoid lung disease ([place]) 06/23/2019 . Right ventricular dysfunction 08/05/2019 . Thrombocytosis ([place]) 09/14/2011 Past Surgical History: Past Surgical History: Procedure Laterality Date . BONE MARROW BIOPSY  2010 . CARDIAC CATHETERIZATION   . LEFT HEART CATHETERIZATION WITH CORONARY ANGIOGRAM N/A 02/24/2013  Procedure: LEFT HEART CATHETERIZATION WITH CORONARY ANGIOGRAM;  Surgeon: Leonie Man, MD;  Location: [facility] CATH LAB;  Service: Cardiovascular;  Laterality: N/A; . RIGHT/LEFT HEART CATH AND CORONARY ANGIOGRAPHY N/A 09/09/2019  Procedure: RIGHT/LEFT HEART CATH AND CORONARY ANGIOGRAPHY;  Surgeon: Jettie Booze, MD;  Location: [facility] CV LAB;  Service: Cardiovascular;  Laterality: N/A; HPI:  Patient is a 59 year old female. She presented to the hospital with reports of stroke like, left facial droop and  unresponsiveness. She has had a complicated hospital stay which included intubation from 9/15-10/4 with trach placed on 10/4.  PMH: lupus, a-fib, CHF, tachycardia, thrombocytosis, right ventricular dysfunction, rheumatoid lung disease, RA. CT hyead and MRI 9/15 and 9/18 were negative for acute changes. MRI brain 9/28: bilateral cerebral convexities measuring up to 3 mm in greatest thickness. Appears to predominantly correspond with diffuse pachymeningeal dural thickening and enhancement. However, small bilateral subdural effusions difficult to exclude.  Subjective: Pt was lethargic with daughter at bedside Assessment / Plan / Recommendation CHL IP CLINICAL IMPRESSIONS 07/19/2020 Clinical Impression Pt was seen for MBS, which revealed moderate oropharyngeal dysphagia as characterized by delayed oral transit, premature spillage, delayed swallow initiation, and moderate pharyngeal residue. Pt wore PMV during instrumental and tolerated it well, however, her vocal quality is dysphonic and low in volume. Delayed oral transit occurred across POs but most significantly with solids.  She also demonstrated some lingual pumping with solids. Premature spillage fell to the level of at least the valleculae across POs, and to the level of the pyriforms with thin liquid. A delayed swallow initiation was observed across POs, but most significantly with solids. She demonstrated one instance of trace aspiration with thin liquid. No other instances of aspiration were observed, even with 3 oz of thin liquid. Moderate residue in the valleculae and pyriforms was observed across POs. When prompted to produce an additional dry swallow after the PO, she successfully cleared most of the residue. SLP also attempted a chin tuck strategy with patient and was successful in reducing some residue. Recommend dys 1 diet and thin liquid with use of chin tuck and double swallow. SLP will f/u acutely for pt education of strategies and diet  tolerance/advancement.  SLP Visit Diagnosis Dysphagia, oropharyngeal phase (R13.12) Attention and concentration deficit following -- Frontal lobe and executive function deficit following -- Impact on safety and function Moderate aspiration risk   CHL IP TREATMENT RECOMMENDATION 07/19/2020 Treatment Recommendations Therapy as outlined in treatment plan below   Prognosis 07/19/2020 Prognosis for Safe Diet Advancement Fair Barriers to Reach Goals Severity of deficits Barriers/Prognosis Comment -- CHL IP DIET RECOMMENDATION 07/19/2020 SLP Diet Recommendations Dysphagia 1 (Puree) solids;Thin liquid Liquid Administration via Straw Medication Administration Crushed with puree Compensations Chin tuck;Multiple dry swallows after each bite/sip;Slow rate;Small sips/bites Postural Changes Seated upright at 90 degrees   CHL IP OTHER RECOMMENDATIONS 07/19/2020 Recommended Consults -- Oral Care Recommendations Oral care BID Other Recommendations --   CHL IP FOLLOW UP RECOMMENDATIONS 07/19/2020 Follow up Recommendations LTACH   CHL IP FREQUENCY AND DURATION 07/19/2020 Speech Therapy Frequency (ACUTE ONLY) min 2x/week Treatment Duration 2 weeks      CHL IP ORAL PHASE 07/19/2020 Oral Phase Impaired Oral - Pudding Teaspoon -- Oral - Pudding Cup -- Oral - Honey Teaspoon -- Oral - Honey Cup -- Oral - Nectar Teaspoon -- Oral - Nectar Cup -- Oral - Nectar Straw Premature spillage;Delayed oral transit Oral - Thin Teaspoon -- Oral - Thin Cup -- Oral - Thin Straw Premature spillage;Delayed oral transit Oral - Puree Premature spillage;Delayed oral transit Oral - Mech Soft -- Oral - Regular Premature spillage;Delayed oral transit;Lingual pumping Oral - Multi-Consistency -- Oral - Pill -- Oral Phase - Comment --  CHL IP PHARYNGEAL PHASE 07/19/2020 Pharyngeal Phase Impaired Pharyngeal- Pudding Teaspoon -- Pharyngeal -- Pharyngeal- Pudding Cup -- Pharyngeal -- Pharyngeal- Honey Teaspoon -- Pharyngeal -- Pharyngeal- Honey Cup -- Pharyngeal --  Pharyngeal- Nectar Teaspoon -- Pharyngeal -- Pharyngeal- Nectar Cup -- Pharyngeal -- Pharyngeal- Nectar Straw Delayed swallow initiation-vallecula;Pharyngeal residue - valleculae;Pharyngeal residue - pyriform Pharyngeal -- Pharyngeal- Thin Teaspoon -- Pharyngeal -- Pharyngeal- Thin Cup -- Pharyngeal -- Pharyngeal- Thin Straw Delayed swallow initiation-vallecula;Delayed swallow initiation-pyriform sinuses;Pharyngeal residue - valleculae;Pharyngeal residue - pyriform;Trace aspiration;Compensatory strategies attempted (with notebox) Pharyngeal -- Pharyngeal- Puree Delayed swallow initiation-vallecula;Pharyngeal residue - valleculae;Pharyngeal residue - pyriform;Compensatory strategies attempted (with notebox) Pharyngeal -- Pharyngeal- Mechanical Soft -- Pharyngeal -- Pharyngeal- Regular Pharyngeal residue - pyriform;Pharyngeal residue - valleculae;Delayed swallow initiation-vallecula;Compensatory strategies attempted (with notebox) Pharyngeal -- Pharyngeal- Multi-consistency -- Pharyngeal -- Pharyngeal- Pill -- Pharyngeal -- Pharyngeal Comment --  No flowsheet data found. [name], MA CCC-SLP Acute Rehabilitation Services Pager [phone] Office [phone] Lynann Beaver 07/19/2020, 12:18 PM              ECHOCARDIOGRAM COMPLETE  Result Date: 07/27/2020    ECHOCARDIOGRAM REPORT   Patient Name:   Carolyn Proctor  Date of Exam: 07/27/2020 Medical Rec #:  [id number]       Height:       71.0 in Accession #:    [id number]      Weight:       120.8 lb Date of Birth:  September 25, 1961       BSA:          1.702 m Patient Age:    40 years        BP:           111/66 mmHg Patient Gender: F               HR:           109 bpm. Exam Location:  Inpatient Procedure: 2D Echo, Cardiac Doppler and Color Doppler Indications:     Bacteremia 790.7 / R78.81  History:         Patient has prior history of Echocardiogram examinations, most                  recent 06/22/2020. CHF, Arrythmias:Atrial Fibrillation; Risk                   Factors:Hypertension and Non-Smoker. Elevated troponin.  Sonographer:     Vickie Epley RDCS Referring Phys:  [place] Diagnosing Phys: Dixie Dials MD  Sonographer Comments: Echo performed with patient supine and on artificial respirator. IMPRESSIONS  1. Left ventricular ejection fraction, by estimation, is 55 to 60%. The left ventricle has normal function. The left ventricle demonstrates regional wall motion abnormalities (see scoring diagram/findings for description). There is mild concentric left ventricular hypertrophy. Left ventricular diastolic parameters are consistent with Grade I diastolic dysfunction (impaired relaxation). Septal flattening suggestive of elevated RV pressures.  2. Right ventricular systolic function is mildly reduced. The right ventricular size is moderately enlarged. There is severely elevated pulmonary artery systolic pressure.  3. Right atrial size was moderately dilated.  4. The pericardial effusion is posterior to the left ventricle.  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 Trivial mitral valve regurgitation.  6. Tricuspid valve regurgitation is severe.  7. The aortic valve is tricuspid. There is mild calcification of the aortic valve. There is mild thickening of the aortic valve. Aortic valve regurgitation is trivial. Mild aortic valve sclerosis is present, with no evidence of aortic valve stenosis.  8. The inferior vena cava is normal in size with greater than 50% respiratory variability, suggesting right atrial pressure of 3 mmHg. FINDINGS  Left Ventricle: Left ventricular ejection fraction, by estimation, is 55 to 60%. The left ventricle has normal function. The left ventricle demonstrates regional wall motion abnormalities. The left ventricular internal cavity size was normal in size. There is mild concentric left ventricular hypertrophy. Left ventricular diastolic parameters are consistent with Grade I diastolic dysfunction (impaired relaxation).  LV Wall Scoring: There  is diffuse normal. Septal flattening suggestive of elevated RV pressures. Right Ventricle: The right ventricular size is moderately enlarged. No increase in right ventricular wall thickness. Right ventricular systolic function is mildly reduced. There is severely elevated pulmonary artery systolic pressure. The tricuspid regurgitant velocity is 4.32 m/s, and with an assumed right atrial pressure of 3 mmHg, the estimated right ventricular systolic pressure is 25.8 mmHg. Left Atrium: Left atrial size was normal in size. Right Atrium: Right atrial size was moderately dilated. Pericardium: Trivial pericardial effusion is present. The pericardial effusion is posterior to the left ventricle. Mitral Valve: The mitral valve is myxomatous. Trivial mitral valve regurgitation. Tricuspid  Valve: The tricuspid valve is normal in structure. Tricuspid valve regurgitation is severe. Aortic Valve: The aortic valve is tricuspid. There is mild calcification of the aortic valve. There is mild thickening of the aortic valve. There is mild aortic valve annular calcification. Aortic valve regurgitation is trivial. Mild aortic valve sclerosis is present, with no evidence of aortic valve stenosis. Pulmonic Valve: The pulmonic valve was normal in structure. Pulmonic valve regurgitation is not visualized. Aorta: The aortic root is normal in size and structure. There is minimal (Grade I) atheroma plaque involving the aortic root and ascending aorta. Venous: The inferior vena cava is normal in size with greater than 50% respiratory variability, suggesting right atrial pressure of 3 mmHg. IAS/Shunts: The interatrial septum was not assessed.  LEFT VENTRICLE PLAX 2D LVIDd:         2.60 cm LVIDs:         2.00 cm LV PW:         1.20 cm LV IVS:        1.10 cm LVOT diam:     2.00 cm LV SV:         31 LV SV Index:   18 LVOT Area:     3.14 cm  LV Volumes (MOD) LV vol d, MOD A4C: 36.7 ml LV vol s, MOD A4C: 19.2 ml LV SV MOD A4C:     36.7 ml RIGHT  VENTRICLE RV S prime:     11.90 cm/s TAPSE (M-mode): 1.4 cm LEFT ATRIUM           Index       RIGHT ATRIUM           Index LA diam:      2.10 cm 1.23 cm/m  RA Area:     14.00 cm LA Vol (A4C): 23.1 ml 13.57 ml/m RA Volume:   34.30 ml  20.15 ml/m  AORTIC VALVE LVOT Vmax:   63.70 cm/s LVOT Vmean:  40.900 cm/s LVOT VTI:    0.098 m  AORTA Ao Root diam: 3.40 cm Ao Asc diam:  3.20 cm MITRAL VALVE               TRICUSPID VALVE MV Area (PHT): 5.75 cm    TR Peak grad:   74.6 mmHg MV Decel Time: 132 msec    TR Vmax:        432.00 cm/s MV E velocity: 59.20 cm/s MV A velocity: 65.50 cm/s  SHUNTS MV E/A ratio:  0.90        Systemic VTI:  0.10 m                            Systemic Diam: 2.00 cm Dixie Dials MD Electronically signed by Dixie Dials MD Signature Date/Time: 07/27/2020/7:55:52 PM    Final     Labs:  CBC: Recent Labs    08/10/20 0403 08/13/20 1238 08/14/20 1041 08/15/20 0908  WBC 11.5* 19.5* 15.4* 11.3*  HGB 12.1 10.4* 11.0* 11.7*  HCT 39.7 35.3* 37.6 39.9  PLT 428* 451* 427* 410*    COAGS: Recent Labs    06/23/20 0534 06/23/20 1011 07/11/20 0523 07/12/20 1810 07/13/20 0206 07/13/20 1055 07/13/20 1943 07/14/20 1406 07/27/20 0408 07/28/20 0334  INR 2.9*  --  1.5*  --   --   --   --   --  1.8* 1.8*  APTT  --    < > 29   < > 75* 64*  37* 85*  --   --    < > = values in this interval not displayed.    BMP: Recent Labs    07/09/20 0459 07/09/20 0459 07/10/20 0518 07/10/20 0518 07/11/20 0523 07/11/20 0523 07/12/20 0538 07/13/20 1055 08/10/20 0403 08/11/20 0428 08/14/20 1041 08/15/20 0654  NA 149*   < > 150*   < > 144   < > 140   < > 142 145 148* 147*  K 4.4   < > 4.0   < > 5.1   < > 5.0   < > 5.2* 4.5 4.8 5.2*  CL 114*   < > 114*   < > 112*   < > 111   < > 103 107 111 111  CO2 24   < > 25   < > 29   < > 19*   < > _0 GLUCOSE 192*   < > 129*   < > 105*   < > 98   < > 159* 196* 178* 179*  BUN 80*   < > 64*   < > 64*   < > 56*   < > 63* 60* 69* 69*  CALCIUM  8.5*   < > 7.0*   < > 8.2*   < > 8.0*   < > 9.0 9.0 9.2 9.0  CREATININE 1.16*   < > 0.95   < > 1.17*   < > 1.07*   < > 0.77 0.79 0.77 0.80  GFRNONAA 52*   < > >60   < > 52*   < > 58*   < > >60 >60 >60 >60  GFRAA >60  --  >60  --  60*  --  >60  --   --   --   --   --    < > = values in this interval not displayed.    LIVER FUNCTION TESTS: Recent Labs    07/20/20 0408 07/20/20 0408 07/22/20 0500 07/22/20 0500 07/27/20 0408 07/28/20 0334 07/29/20 0437 08/04/20 0507 08/05/20 0453 08/06/20 0716 08/14/20 1041 08/15/20 0654  BILITOT 0.9  --  0.7  --  1.1  --  0.9  --   --   --   --   --   AST 30  --  40  --  46*  --  42*  --   --   --   --   --   ALT 35  --  48*  --  68*  --  58*  --   --   --   --   --   ALKPHOS 187*  --  268*  --  270*  --  215*  --   --   --   --   --   PROT 5.5*  --  6.0*  --  6.7  --  6.1*  --   --   --   --   --   ALBUMIN 1.6*   < > 1.9*   < > 2.5*   < > 2.3*   < > 2.0* 2.0* 2.3* 2.2*   < > = values in this interval not displayed.     Assessment and Plan:  Dysphagia. Plan for image-guided percutaneous gastrostomy tube in IR tentatively for Thursday 08/18/2020 pending IR scheduling. Patient will be NPO at midnight prior to procedure. Afebrile. Will hold Eliquis per IR protocol. INR pending.  Risks and benefits discussed with the patient  including, but not limited to the need for a barium enema during the procedure, bleeding, infection, peritonitis, or damage to adjacent structures. All of the patient's questions were answered, patient is agreeable to proceed. Consent obtained by patient's daughter, Carolyn Proctor, via telephone- signed and in IR control room.   Thank you for this interesting consult.  I greatly enjoyed meeting Shakena H Schlossberg and look forward to participating in their care.  A copy of this report was sent to the requesting provider on this date.  Electronically Signed: Earley Abide, PA-C 08/15/2020, 2:19 PM   I spent a total of 40  Minutes in face to face in clinical consultation, greater than 50% of which was counseling/coordinating care for dysphagia/percutaneous gastrostomy tube placement.

## 2020-08-16 LAB — BASIC METABOLIC PANEL
Anion gap: 9 (ref 5–15)
BUN: 55 mg/dL — ABNORMAL HIGH (ref 6–20)
CO2: 27 mmol/L (ref 22–32)
Calcium: 8.9 mg/dL (ref 8.9–10.3)
Chloride: 110 mmol/L (ref 98–111)
Creatinine, Ser: 0.72 mg/dL (ref 0.44–1.00)
GFR, Estimated: >60 mL/min (ref >60)
Glucose, Bld: 143 mg/dL — ABNORMAL HIGH (ref 70–99)
Potassium: 4 mmol/L (ref 3.5–5.1)
Sodium: 146 mmol/L — ABNORMAL HIGH (ref 135–145)

## 2020-08-16 LAB — LEVETIRACETAM LEVEL: Levetiracetam Lvl: 33.7 ug/mL (ref 10.0–40.0)

## 2020-08-16 LAB — MAGNESIUM: Magnesium: 2.2 mg/dL (ref 1.7–2.4)

## 2020-08-17 ENCOUNTER — Other Ambulatory Visit (HOSPITAL_COMMUNITY): Payer: BC Managed Care – PPO

## 2020-08-17 LAB — BASIC METABOLIC PANEL
Anion gap: 10 (ref 5–15)
BUN: 57 mg/dL — ABNORMAL HIGH (ref 6–20)
CO2: 25 mmol/L (ref 22–32)
Calcium: 8.5 mg/dL — ABNORMAL LOW (ref 8.9–10.3)
Chloride: 108 mmol/L (ref 98–111)
Creatinine, Ser: 0.76 mg/dL (ref 0.44–1.00)
GFR, Estimated: >60 mL/min (ref >60)
Glucose, Bld: 158 mg/dL — ABNORMAL HIGH (ref 70–99)
Potassium: 4.6 mmol/L (ref 3.5–5.1)
Sodium: 143 mmol/L (ref 135–145)

## 2020-08-17 LAB — CBC
HCT: 37.4 % (ref 36.0–46.0)
Hemoglobin: 10.7 g/dL — ABNORMAL LOW (ref 12.0–15.0)
MCH: 32.7 pg (ref 26.0–34.0)
MCHC: 28.6 g/dL — ABNORMAL LOW (ref 30.0–36.0)
MCV: 114.4 fL — ABNORMAL HIGH (ref 80.0–100.0)
Platelets: 375 10*3/uL (ref 150–400)
RBC: 3.27 MIL/uL — ABNORMAL LOW (ref 3.87–5.11)
RDW: 23.7 % — ABNORMAL HIGH (ref 11.5–15.5)
WBC: 10.9 10*3/uL — ABNORMAL HIGH (ref 4.0–10.5)
nRBC: 6.4 % — ABNORMAL HIGH (ref 0.0–0.2)

## 2020-08-17 LAB — MAGNESIUM: Magnesium: 2.3 mg/dL (ref 1.7–2.4)

## 2020-08-17 LAB — PHOSPHORUS: Phosphorus: 4.3 mg/dL (ref 2.5–4.6)

## 2020-08-18 ENCOUNTER — Other Ambulatory Visit (HOSPITAL_COMMUNITY): Payer: BC Managed Care – PPO

## 2020-08-18 HISTORY — PX: IR GASTROSTOMY TUBE MOD SED: IMG625

## 2020-08-18 LAB — PROTIME-INR
INR: 1.6 — ABNORMAL HIGH (ref 0.8–1.2)
Prothrombin Time: 18.1 seconds — ABNORMAL HIGH (ref 11.4–15.2)

## 2020-08-18 MED ORDER — CEFAZOLIN SODIUM-DEXTROSE 2-4 GM/100ML-% IV SOLN
INTRAVENOUS | Status: AC
  Administered 2020-08-18: 2 g via INTRAVENOUS
  Filled 2020-08-18: qty 100

## 2020-08-18 MED ORDER — CEFAZOLIN SODIUM-DEXTROSE 2-4 GM/100ML-% IV SOLN: 2.0000 g | Freq: Once | INTRAVENOUS | Status: AC

## 2020-08-18 MED ORDER — FENTANYL CITRATE (PF) 100 MCG/2ML IJ SOLN
INTRAMUSCULAR | Status: AC
  Filled 2020-08-18: qty 4

## 2020-08-18 MED ORDER — MIDAZOLAM HCL 2 MG/2ML IJ SOLN
INTRAMUSCULAR | Status: AC
  Filled 2020-08-18: qty 4

## 2020-08-18 MED ORDER — FENTANYL CITRATE (PF) 100 MCG/2ML IJ SOLN
INTRAMUSCULAR | Status: AC | PRN
Start: 2020-08-18 — End: 2020-08-18
  Administered 2020-08-18: 50 ug via INTRAVENOUS

## 2020-08-18 MED ORDER — LIDOCAINE HCL 1 % IJ SOLN
INTRAMUSCULAR | Status: AC | PRN
  Administered 2020-08-18: 20 mL via INTRADERMAL

## 2020-08-18 MED ORDER — MIDAZOLAM HCL 2 MG/2ML IJ SOLN
INTRAMUSCULAR | Status: AC | PRN
  Administered 2020-08-18: 1 mg via INTRAVENOUS

## 2020-08-18 MED ORDER — LIDOCAINE HCL 1 % IJ SOLN
INTRAMUSCULAR | Status: AC
  Filled 2020-08-18: qty 20

## 2020-08-18 MED ORDER — IOHEXOL 300 MG/ML  SOLN
50.0000 mL | Freq: Once | INTRAMUSCULAR | Status: AC | PRN
  Administered 2020-08-18: 20 mL

## 2020-08-18 NOTE — Procedures (Addendum)
Interventional Radiology Procedure Note  Procedure: Placement of percutaneous 31F gastrostomy tube.  Complications: None immediate  Recommendations: - NPO except for sips and chips remainder of today and overnight - Maintain G-tube to LWS until tomorrow morning  - May advance diet as tolerated and begin using tube tomorrow morning - IR will arrange follow up for gastric wall anchor suture removal in 2 weeks   Ruthann Cancer, MD Pager: [phone]

## 2020-08-19 ENCOUNTER — Other Ambulatory Visit (HOSPITAL_COMMUNITY): Payer: BC Managed Care – PPO

## 2020-08-19 LAB — BASIC METABOLIC PANEL
Anion gap: 13 (ref 5–15)
BUN: 47 mg/dL — ABNORMAL HIGH (ref 6–20)
CO2: 24 mmol/L (ref 22–32)
Calcium: 9 mg/dL (ref 8.9–10.3)
Chloride: 106 mmol/L (ref 98–111)
Creatinine, Ser: 0.83 mg/dL (ref 0.44–1.00)
GFR, Estimated: >60 mL/min (ref >60)
Glucose, Bld: 107 mg/dL — ABNORMAL HIGH (ref 70–99)
Potassium: 4.5 mmol/L (ref 3.5–5.1)
Sodium: 143 mmol/L (ref 135–145)

## 2020-08-19 LAB — CBC
HCT: 41.6 % (ref 36.0–46.0)
Hemoglobin: 12 g/dL (ref 12.0–15.0)
MCH: 33 pg (ref 26.0–34.0)
MCHC: 28.8 g/dL — ABNORMAL LOW (ref 30.0–36.0)
MCV: 114.3 fL — ABNORMAL HIGH (ref 80.0–100.0)
Platelets: 421 10*3/uL — ABNORMAL HIGH (ref 150–400)
RBC: 3.64 MIL/uL — ABNORMAL LOW (ref 3.87–5.11)
RDW: 22.8 % — ABNORMAL HIGH (ref 11.5–15.5)
WBC: 11.6 10*3/uL — ABNORMAL HIGH (ref 4.0–10.5)
nRBC: 2.7 % — ABNORMAL HIGH (ref 0.0–0.2)

## 2020-08-19 LAB — PHOSPHORUS: Phosphorus: 5.1 mg/dL — ABNORMAL HIGH (ref 2.5–4.6)

## 2020-08-19 LAB — MAGNESIUM: Magnesium: 2.1 mg/dL (ref 1.7–2.4)

## 2020-08-19 NOTE — Progress Notes (Signed)
IR.  History of dysphasia s/p percutaneous gastrostomy tube placement in IR 08/18/2020 by Dr. Serafina Royals.  Patient awake and alert laying in bed. Complains of abdominal pain near gastrostomy tube site, as expected, rated 7/10- RN aware of this.   Gastrostomy tube site with tenderness (again as expected) and minimal amount of dried blood under bumper, no active bleeding, erythema, or drainage noted. Bumper cinched to skin. Gastric wall anchor sutures in place.  Gastrostomy tube stable- tube is ready for full use. Please ensure bumper is cinched to skin to prevent leakage. Will return for gastric wall anchor suture removal in approximately 2 weeks per Dr. Serafina Royals. Further plans per [facility]- appreciate and agree with management. Please call IR with questions/concerns.   Bea Graff Jenea Dake, PA-C 08/19/2020, 11:08 AM

## 2020-08-21 LAB — BLOOD GAS, ARTERIAL
Acid-Base Excess: 2.1 mmol/L — ABNORMAL HIGH (ref 0.0–2.0)
Bicarbonate: 25.8 mmol/L (ref 20.0–28.0)
FIO2: 44
O2 Saturation: 92.3 %
Patient temperature: 37
pCO2 arterial: 37.7 mmHg (ref 32.0–48.0)
pH, Arterial: 7.45 (ref 7.350–7.450)
pO2, Arterial: 65.6 mmHg — ABNORMAL LOW (ref 83.0–108.0)

## 2020-08-22 ENCOUNTER — Other Ambulatory Visit (HOSPITAL_COMMUNITY): Payer: BC Managed Care – PPO

## 2020-08-22 LAB — PHOSPHORUS: Phosphorus: 4.2 mg/dL (ref 2.5–4.6)

## 2020-08-22 LAB — BASIC METABOLIC PANEL
Anion gap: 12 (ref 5–15)
BUN: 58 mg/dL — ABNORMAL HIGH (ref 6–20)
CO2: 26 mmol/L (ref 22–32)
Calcium: 8.9 mg/dL (ref 8.9–10.3)
Chloride: 100 mmol/L (ref 98–111)
Creatinine, Ser: 0.65 mg/dL (ref 0.44–1.00)
GFR, Estimated: >60 mL/min (ref >60)
Glucose, Bld: 105 mg/dL — ABNORMAL HIGH (ref 70–99)
Potassium: 5 mmol/L (ref 3.5–5.1)
Sodium: 138 mmol/L (ref 135–145)

## 2020-08-22 LAB — CBC
HCT: 37.6 % (ref 36.0–46.0)
Hemoglobin: 10.9 g/dL — ABNORMAL LOW (ref 12.0–15.0)
MCH: 31.9 pg (ref 26.0–34.0)
MCHC: 29 g/dL — ABNORMAL LOW (ref 30.0–36.0)
MCV: 109.9 fL — ABNORMAL HIGH (ref 80.0–100.0)
Platelets: 447 10*3/uL — ABNORMAL HIGH (ref 150–400)
RBC: 3.42 MIL/uL — ABNORMAL LOW (ref 3.87–5.11)
RDW: 20.9 % — ABNORMAL HIGH (ref 11.5–15.5)
WBC: 10.4 10*3/uL (ref 4.0–10.5)
nRBC: 0.8 % — ABNORMAL HIGH (ref 0.0–0.2)

## 2020-08-22 LAB — MAGNESIUM: Magnesium: 2 mg/dL (ref 1.7–2.4)

## 2020-08-24 LAB — EXPECTORATED SPUTUM ASSESSMENT W GRAM STAIN, RFLX TO RESP C

## 2020-08-25 LAB — CBC
HCT: 37.5 % (ref 36.0–46.0)
Hemoglobin: 11.3 g/dL — ABNORMAL LOW (ref 12.0–15.0)
MCH: 31.9 pg (ref 26.0–34.0)
MCHC: 30.1 g/dL (ref 30.0–36.0)
MCV: 105.9 fL — ABNORMAL HIGH (ref 80.0–100.0)
Platelets: 517 10*3/uL — ABNORMAL HIGH (ref 150–400)
RBC: 3.54 MIL/uL — ABNORMAL LOW (ref 3.87–5.11)
RDW: 20.5 % — ABNORMAL HIGH (ref 11.5–15.5)
WBC: 9.3 10*3/uL (ref 4.0–10.5)
nRBC: 1.2 % — ABNORMAL HIGH (ref 0.0–0.2)

## 2020-08-25 LAB — BASIC METABOLIC PANEL
Anion gap: 8 (ref 5–15)
BUN: 64 mg/dL — ABNORMAL HIGH (ref 6–20)
CO2: 26 mmol/L (ref 22–32)
Calcium: 8.9 mg/dL (ref 8.9–10.3)
Chloride: 102 mmol/L (ref 98–111)
Creatinine, Ser: 0.64 mg/dL (ref 0.44–1.00)
GFR, Estimated: >60 mL/min (ref >60)
Glucose, Bld: 122 mg/dL — ABNORMAL HIGH (ref 70–99)
Potassium: 5 mmol/L (ref 3.5–5.1)
Sodium: 136 mmol/L (ref 135–145)

## 2020-08-25 LAB — MAGNESIUM: Magnesium: 2.1 mg/dL (ref 1.7–2.4)

## 2020-08-25 LAB — AMMONIA: Ammonia: 27 umol/L (ref 9–35)

## 2020-08-27 LAB — CULTURE, RESPIRATORY W GRAM STAIN: Gram Stain: NONE SEEN

## 2020-08-28 ENCOUNTER — Encounter (HOSPITAL_COMMUNITY): Payer: BC Managed Care – PPO

## 2020-08-28 LAB — BASIC METABOLIC PANEL
Anion gap: 8 (ref 5–15)
BUN: 62 mg/dL — ABNORMAL HIGH (ref 6–20)
CO2: 27 mmol/L (ref 22–32)
Calcium: 8.8 mg/dL — ABNORMAL LOW (ref 8.9–10.3)
Chloride: 103 mmol/L (ref 98–111)
Creatinine, Ser: 0.71 mg/dL (ref 0.44–1.00)
GFR, Estimated: >60 mL/min (ref >60)
Glucose, Bld: 155 mg/dL — ABNORMAL HIGH (ref 70–99)
Potassium: 6 mmol/L — ABNORMAL HIGH (ref 3.5–5.1)
Sodium: 138 mmol/L (ref 135–145)

## 2020-08-28 LAB — CBC
HCT: 38.7 % (ref 36.0–46.0)
Hemoglobin: 11.1 g/dL — ABNORMAL LOW (ref 12.0–15.0)
MCH: 30.7 pg (ref 26.0–34.0)
MCHC: 28.7 g/dL — ABNORMAL LOW (ref 30.0–36.0)
MCV: 107.2 fL — ABNORMAL HIGH (ref 80.0–100.0)
Platelets: 531 10*3/uL — ABNORMAL HIGH (ref 150–400)
RBC: 3.61 MIL/uL — ABNORMAL LOW (ref 3.87–5.11)
RDW: 20.5 % — ABNORMAL HIGH (ref 11.5–15.5)
WBC: 13.7 10*3/uL — ABNORMAL HIGH (ref 4.0–10.5)
nRBC: 1.2 % — ABNORMAL HIGH (ref 0.0–0.2)

## 2020-08-28 LAB — POTASSIUM
Potassium: 5.4 mmol/L — ABNORMAL HIGH (ref 3.5–5.1)
Potassium: 5.3 mmol/L — ABNORMAL HIGH (ref 3.5–5.1)

## 2020-08-28 LAB — MAGNESIUM: Magnesium: 1.9 mg/dL (ref 1.7–2.4)

## 2020-08-29 LAB — POTASSIUM: Potassium: 4.5 mmol/L (ref 3.5–5.1)

## 2020-08-29 NOTE — Consult Note (Signed)
Infectious Disease Consultation   Carolyn Proctor  YJE:[id number]  DOB: August 25, 1961  DOA: 07/26/2020  Requesting physician: Dr.Hijazi  Reason for consultation: Antibiotic recommendations   History of Present Illness: Carolyn Proctor is an 59 y.o. female with multiple medical problems significant for paroxysmal atrial fibrillation, DVT, PE on Eliquis, history of heart failure with preserved ejection fraction, CKD stage III, rheumatoid arthritis, hypercoagulable state secondary to protein C and S deficiency, decreased Antithrombin III, emphysema, pulmonary fibrosis, chronic venous insufficiency with chronic foot ulcer, mixed connective tissue disease who was admitted to the outside facility with strokelike symptoms.  Patient was found to be profoundly hypoglycemic, hypotensive on admission.  She was intubated for airway protection.  She had refractory hypoglycemia and was treated with glucose infusion.  She was given stress dose steroids.  She was thought to be in cardiogenic shock and was treated accordingly.  Patient also apparently was encephalopathic.  Tracheostomy was placed on 07/11/2020 and she was continued on ventilator support.  Hospital course complicated by healthcare associated pneumonia with sputum cultures that showed ESBL E. coli in the setting of underlying interstitial lung disease, connective tissue disorder, acute pneumonitis and pulmonary edema.  Patient was also found to have acute systolic congestive heart failure for which cardiology was consulted.  She required amiodarone drip for atrial fibrillation with RVR and NSVT.  She was started empirically on meropenem.  There was thought to do right heart catheterization but given her debilitated state cardiology recommended medical management.  Due to her complex medical problems she was transferred to [facility] on 07/26/2020.  After admission here she was treated on and off with multiple antibiotics.  She had urine  cultures from 08/03/2020 that showed E. coli which was pansensitive for which she was treated.  Respiratory cultures from 08/03/2020 per report Klebsiella pneumonia, staph aureus.  She was also treated for this pneumonia.  Subsequent sputum cultures did not show any growth.  However, she started having fevers and leukocytosis again and sputum cultures from 08/23/2020 showed gram-negative rods and gram-positive cocci.  Therefore she was started on empiric cefepime and doxycycline for MRSA coverage.  She is currently on oxygen by nasal cannula.  She is continuing to complain of some cough with sputum, has shortness of breath.  Denies having any chest pain, nausea, vomiting, abdominal pain or diarrhea or dysuria at this time.  Review of Systems:  Review of systems negative except as mentioned above in the HPI.   Past Medical History: Past Medical History:  Diagnosis Date  . Acute myopericarditis 02/24/2013  . Acute on chronic respiratory failure with hypoxia ([place])   . Anoxic brain injury ([place])   . Atrial fibrillation ([place])   . Atrial fibrillation with RVR ([place]) 02/22/2013  . Chronic anticoagulation, Xarelto 04/02/2013  . Chronic diastolic CHF (congestive heart failure) ([place]) 06/26/2019  . Chronic pulmonary hypertension ([place])   . Cor pulmonale (chronic) ([place])   . Diverticulitis   . Elevated troponin- secondary to Myopericarditis 02/25/2013  . Emphysema  07/07/2019  . Hypertension   . Lupus ([place]) 09/14/2011  . 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 PAF (paroxysmal atrial fibrillation) ([place]) 04/02/2013  . Pleuritic chest pain 02/22/2013  . Pulmonary fibrosis ([place])   . 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 Rheumatoid arthritis ([place]) 07/07/2019  . Rheumatoid lung disease ([place])  06/23/2019  . Right ventricular dysfunction 08/05/2019  . Thrombocytosis  09/14/2011    Past Surgical History: Past Surgical History:  Procedure Laterality Date  . BONE MARROW BIOPSY  2010  . CARDIAC CATHETERIZATION    . IR GASTROSTOMY TUBE MOD SED  08/18/2020  . LEFT HEART CATHETERIZATION WITH CORONARY ANGIOGRAM N/A 02/24/2013   Procedure: LEFT HEART CATHETERIZATION WITH CORONARY ANGIOGRAM;  Surgeon: Leonie Man, MD;  Location: [facility] CATH LAB;  Service: Cardiovascular;  Laterality: N/A;  . RIGHT/LEFT HEART CATH AND CORONARY ANGIOGRAPHY N/A 09/09/2019   Procedure: RIGHT/LEFT HEART CATH AND CORONARY ANGIOGRAPHY;  Surgeon: Jettie Booze, MD;  Location: [facility] CV LAB;  Service: Cardiovascular;  Laterality: N/A;     Allergies:  No Known Allergies   Social History:  reports that she has never smoked. She has never used smokeless tobacco. She reports that she does not drink alcohol and does not use drugs.   Family History: Family History  Problem Relation Age of Onset  . Lupus Mother   . Liver disease Mother   . Hypertension Maternal Grandmother   . Lupus Brother    Physical Exam: Vitals: Temperature 97.4, pulse 67, respiratory rate 24, blood pressure 115/70, pulse oximetry 96%  Constitutional: Ill-appearing female, awake Head: Atraumatic, normocephalic Eyes: PERLA, EOMI, irises appear normal  ENMT: external ears and nose appear normal, normal hearing, moist oral mucosa Neck: Supple  CVS: S1-S2  Respiratory: Decreased breath sound lower lobes, few Rales, fine crackles Abdomen: Firm, nontender, positive bowel sounds Musculoskeletal: Dressing bilateral lower extremities, ischemic fingertips, no edema Neuro: Debility with generalized weakness otherwise grossly nonfocal Psych: stable mood and affect Skin: No new rashes  Data reviewed:  I have personally reviewed following labs and imaging studies Labs:  CBC: Recent Labs  Lab 08/25/20 0426 08/28/20 0732  WBC 9.3 13.7*  HGB 11.3* 11.1*  HCT 37.5 38.7  MCV 105.9* 107.2*  PLT 517*  531*    Basic Metabolic Panel: Recent Labs  Lab 08/25/20 0426 08/25/20 0426 08/28/20 0732 08/28/20 1544 08/28/20 2157 08/29/20 1704  NA 136  --  138  --   --   --   K 5.0   < > 6.0*   < > 5.3* 4.5  CL 102  --  103  --   --   --   CO2 26  --  27  --   --   --   GLUCOSE 122*  --  155*  --   --   --   BUN 64*  --  62*  --   --   --   CREATININE 0.64  --  0.71  --   --   --   CALCIUM 8.9  --  8.8*  --   --   --   MG 2.1  --  1.9  --   --   --    < > = values in this interval not displayed.   GFR CrCl cannot be calculated (Unknown ideal weight.). Liver Function Tests: No results for input(s): AST, ALT, ALKPHOS, BILITOT, PROT, ALBUMIN in the last 168 hours. No results for input(s): LIPASE, AMYLASE in the last 168 hours. Recent Labs  Lab 08/25/20 0426  AMMONIA 27   Coagulation profile No results for input(s): INR, PROTIME in the last 168 hours.  Cardiac Enzymes: No results for input(s): CKTOTAL, CKMB, CKMBINDEX, TROPONINI in the last 168 hours. BNP: Invalid input(s): POCBNP CBG: No results for input(s): GLUCAP in the last 168 hours.  D-Dimer No results for input(s): DDIMER in the last 72 hours. Hgb A1c No results for input(s): HGBA1C in the last 72 hours. Lipid Profile No results for input(s): CHOL, HDL, LDLCALC, TRIG, CHOLHDL, LDLDIRECT in the last 72 hours. Thyroid function studies No results for input(s): TSH, T4TOTAL, T3FREE, THYROIDAB in the last 72 hours.  Invalid input(s): FREET3 Anemia work up No results for input(s): VITAMINB12, FOLATE, FERRITIN, TIBC, IRON, RETICCTPCT in the last 72 hours. Urinalysis    Component Value Date/Time   COLORURINE AMBER (A) 08/14/2020 1425   APPEARANCEUR HAZY (A) 08/14/2020 1425   LABSPEC 1.023 08/14/2020 1425   PHURINE 6.0 08/14/2020 1425   GLUCOSEU NEGATIVE 08/14/2020 1425   HGBUR NEGATIVE 08/14/2020 1425   BILIRUBINUR NEGATIVE 08/14/2020 1425   KETONESUR NEGATIVE 08/14/2020 1425   PROTEINUR 100 (A) 08/14/2020 1425    UROBILINOGEN 1.0 01/16/2013 1737   NITRITE NEGATIVE 08/14/2020 1425   LEUKOCYTESUR NEGATIVE 08/14/2020 1425     Microbiology Recent Results (from the past 240 hour(s))  Expectorated sputum assessment w rflx to resp cult     Status: None   Collection Time: 08/23/20  4:08 PM   Specimen: Expectorated Sputum  Result Value Ref Range Status   Specimen Description EXPECTORATED SPUTUM  Final   Special Requests NONE  Final   Sputum evaluation   Final    THIS SPECIMEN IS ACCEPTABLE FOR SPUTUM CULTURE Performed at [facility], [place] [address], [place], [place] [address]    Report Status 08/24/2020 FINAL  Final  Culture, respiratory     Status: None   Collection Time: 08/23/20  4:08 PM  Result Value Ref Range Status   Specimen Description EXPECTORATED SPUTUM  Final   Special Requests NONE Reflexed from [id number]  Final   Gram Stain   Final    NO WBC SEEN RARE SQUAMOUS EPITHELIAL CELLS PRESENT ABUNDANT GRAM POSITIVE COCCI MODERATE GRAM POSITIVE RODS RARE GRAM NEGATIVE RODS Performed at [facility], [place] [address] [place], [place] [address]    Culture   Final    ABUNDANT STAPHYLOCOCCUS AUREUS MODERATE ENTEROBACTER CLOACAE    Report Status 08/27/2020 FINAL  Final   Organism ID, Bacteria STAPHYLOCOCCUS AUREUS  Final   Organism ID, Bacteria ENTEROBACTER CLOACAE  Final      Susceptibility   Enterobacter cloacae - MIC*    CEFAZOLIN >=64 RESISTANT Resistant     CEFEPIME 2 SENSITIVE Sensitive     CEFTAZIDIME >=64 RESISTANT Resistant     CIPROFLOXACIN <=0.25 SENSITIVE Sensitive     GENTAMICIN <=1 SENSITIVE Sensitive     IMIPENEM <=0.25 SENSITIVE Sensitive     TRIMETH/SULFA <=20 SENSITIVE Sensitive     PIP/TAZO >=128 RESISTANT Resistant     * MODERATE ENTEROBACTER CLOACAE   Staphylococcus aureus - MIC*    CIPROFLOXACIN <=0.5 SENSITIVE Sensitive     ERYTHROMYCIN >=8 RESISTANT Resistant     GENTAMICIN <=0.5 SENSITIVE Sensitive     OXACILLIN 0.5 SENSITIVE Sensitive      TETRACYCLINE <=1 SENSITIVE Sensitive     VANCOMYCIN <=0.5 SENSITIVE Sensitive     TRIMETH/SULFA <=10 SENSITIVE Sensitive     CLINDAMYCIN RESISTANT Resistant     RIFAMPIN <=0.5 SENSITIVE Sensitive     Inducible Clindamycin POSITIVE Resistant     * ABUNDANT STAPHYLOCOCCUS AUREUS     Inpatient Medications:   Please see MAR   Radiological Exams on Admission: No results found.  Impression/Recommendations Active Problems: Acute on chronic respiratory failure with hypoxia (HCC) Pneumonia with MSSA, Enterobacter  UTI with E. Coli Interstitial lung disease with ARDS Pulmonary fibrosis (HCC) Cor pulmonale (chronic) (HCC) Chronic pulmonary hypertension (HCC) Chronic diastolic CHF (congestive heart failure) (HCC) Dysphagia/protein calorie malnutrition Connective tissue disorder with PR-3/positive vasculitis with rheumatoid arthritis, SLE with positive RNP Liver cirrhosis Bilateral lower extremity pressure ulcer  Acute on chronic hypoxemic respiratory failure: Multifactorial etiology.  Patient has interstitial lung disease/ARDS/pulmonary hypertension/pulmonary fibrosis.  She has connective tissue disorder with PR-3 positive vasculitis, rheumatoid arthritis, SLE with positive RNP.  She has had on and off recurrent pneumonia and has received treatment with multiple antibiotics.  More recently respiratory cultures showing MSSA, Enterobacter.  She was empirically started on cefepime and doxycycline for both gram-negative coverage as well as MRSA coverage since the respiratory cultures did not show any MRSA would recommend to discontinue the doxycycline.  She seems to be tolerating the cefepime and responding to it.  Since she is nearing the end of her therapy would recommend to continue with the cefepime.  However, unfortunately because of her chronic problems she is very high risk for recurrent pneumonia and worsening respiratory failure.  If she starts having worsening shortness of breath, fever or  leukocytosis would recommend to send for repeat respiratory cultures and also repeat chest imaging preferably chest CT to better evaluate.  Pneumonia: As mentioned above she has had recurrent pneumonia with different organisms and has received multiple rounds of antibiotics.  Antibiotics and plan as mentioned above.  Continue to monitor closely.  Again, due to her chronic pulmonary issues and dysphagia she is very high risk for recurrent pneumonia and worsening respiratory failure.  UTI: She previously had UTI with E. coli for which she was already treated.  She is high risk for recurrent UTI.  Interstitial lung disease/pulmonary fibrosis/cor pulmonale/chronic pulmonary hypertension: Continue medications and supportive management per the primary team.  Congestive heart failure: Patient has chronic diastolic congestive heart failure but had acute systolic heart failure episode during her hospitalization in the acute facility.  Continue medications and management per the primary team.  Connective tissue disorder with PR-3/positive vasculitis with rheumatoid arthritis, SLE with positive RNP: She has received treatment with rituximab, on prednisone.  She will need to follow-up with rheumatology once her acute issues resolved.  Dysphagia/protein calorie malnutrition: Unfortunately due to her dysphagia she is very high risk for aspiration and recurrent aspiration pneumonia.  Liver cirrhosis: On lactulose.  Further management per the primary team.  Bilateral lower extremity pressure ulcers: She has dressing in place.  Continue local wound care.  Unfortunately due to her multiple complex medical problems she is very high risk for worsening and decompensation. Thank you for this consultation.  Plan of care discussed with the patient and, primary team and pharmacy.  Yaakov Guthrie M.D. 08/29/2020, 7:01 PM

## 2020-08-30 ENCOUNTER — Other Ambulatory Visit (HOSPITAL_COMMUNITY): Payer: BC Managed Care – PPO

## 2020-08-30 LAB — RENAL FUNCTION PANEL
Albumin: 2 g/dL — ABNORMAL LOW (ref 3.5–5.0)
Anion gap: 11 (ref 5–15)
BUN: 59 mg/dL — ABNORMAL HIGH (ref 6–20)
CO2: 27 mmol/L (ref 22–32)
Calcium: 8.9 mg/dL (ref 8.9–10.3)
Chloride: 99 mmol/L (ref 98–111)
Creatinine, Ser: 0.64 mg/dL (ref 0.44–1.00)
GFR, Estimated: >60 mL/min (ref >60)
Glucose, Bld: 119 mg/dL — ABNORMAL HIGH (ref 70–99)
Phosphorus: 3.2 mg/dL (ref 2.5–4.6)
Potassium: 4.5 mmol/L (ref 3.5–5.1)
Sodium: 137 mmol/L (ref 135–145)

## 2020-08-30 LAB — CBC
HCT: 39 % (ref 36.0–46.0)
Hemoglobin: 11.6 g/dL — ABNORMAL LOW (ref 12.0–15.0)
MCH: 31.2 pg (ref 26.0–34.0)
MCHC: 29.7 g/dL — ABNORMAL LOW (ref 30.0–36.0)
MCV: 104.8 fL — ABNORMAL HIGH (ref 80.0–100.0)
Platelets: 510 10*3/uL — ABNORMAL HIGH (ref 150–400)
RBC: 3.72 MIL/uL — ABNORMAL LOW (ref 3.87–5.11)
RDW: 20.5 % — ABNORMAL HIGH (ref 11.5–15.5)
WBC: 15.9 10*3/uL — ABNORMAL HIGH (ref 4.0–10.5)
nRBC: 1.1 % — ABNORMAL HIGH (ref 0.0–0.2)

## 2020-08-30 LAB — MAGNESIUM: Magnesium: 2 mg/dL (ref 1.7–2.4)

## 2020-09-01 LAB — CBC
HCT: 44 % (ref 36.0–46.0)
Hemoglobin: 12.9 g/dL (ref 12.0–15.0)
MCH: 30.6 pg (ref 26.0–34.0)
MCHC: 29.3 g/dL — ABNORMAL LOW (ref 30.0–36.0)
MCV: 104.3 fL — ABNORMAL HIGH (ref 80.0–100.0)
Platelets: 516 10*3/uL — ABNORMAL HIGH (ref 150–400)
RBC: 4.22 MIL/uL (ref 3.87–5.11)
RDW: 21.2 % — ABNORMAL HIGH (ref 11.5–15.5)
WBC: 14.7 10*3/uL — ABNORMAL HIGH (ref 4.0–10.5)
nRBC: 1.8 % — ABNORMAL HIGH (ref 0.0–0.2)

## 2020-09-01 LAB — BASIC METABOLIC PANEL
Anion gap: 13 (ref 5–15)
BUN: 66 mg/dL — ABNORMAL HIGH (ref 6–20)
CO2: 22 mmol/L (ref 22–32)
Calcium: 9.4 mg/dL (ref 8.9–10.3)
Chloride: 103 mmol/L (ref 98–111)
Creatinine, Ser: 0.76 mg/dL (ref 0.44–1.00)
GFR, Estimated: >60 mL/min (ref >60)
Glucose, Bld: 160 mg/dL — ABNORMAL HIGH (ref 70–99)
Potassium: 5.4 mmol/L — ABNORMAL HIGH (ref 3.5–5.1)
Sodium: 138 mmol/L (ref 135–145)

## 2020-09-01 LAB — PHOSPHORUS: Phosphorus: 3.4 mg/dL (ref 2.5–4.6)

## 2020-09-01 LAB — MAGNESIUM: Magnesium: 2.1 mg/dL (ref 1.7–2.4)

## 2020-09-02 LAB — POTASSIUM: Potassium: 4.6 mmol/L (ref 3.5–5.1)

## 2020-09-04 LAB — CBC
HCT: 41.3 % (ref 36.0–46.0)
Hemoglobin: 12 g/dL (ref 12.0–15.0)
MCH: 30.3 pg (ref 26.0–34.0)
MCHC: 29.1 g/dL — ABNORMAL LOW (ref 30.0–36.0)
MCV: 104.3 fL — ABNORMAL HIGH (ref 80.0–100.0)
Platelets: 414 10*3/uL — ABNORMAL HIGH (ref 150–400)
RBC: 3.96 MIL/uL (ref 3.87–5.11)
RDW: 21.2 % — ABNORMAL HIGH (ref 11.5–15.5)
WBC: 16.1 10*3/uL — ABNORMAL HIGH (ref 4.0–10.5)
nRBC: 1.9 % — ABNORMAL HIGH (ref 0.0–0.2)

## 2020-09-04 LAB — BASIC METABOLIC PANEL
Anion gap: 11 (ref 5–15)
BUN: 73 mg/dL — ABNORMAL HIGH (ref 6–20)
CO2: 23 mmol/L (ref 22–32)
Calcium: 9.4 mg/dL (ref 8.9–10.3)
Chloride: 104 mmol/L (ref 98–111)
Creatinine, Ser: 0.77 mg/dL (ref 0.44–1.00)
GFR, Estimated: >60 mL/min (ref >60)
Glucose, Bld: 119 mg/dL — ABNORMAL HIGH (ref 70–99)
Potassium: 4.7 mmol/L (ref 3.5–5.1)
Sodium: 138 mmol/L (ref 135–145)

## 2020-09-04 LAB — MAGNESIUM: Magnesium: 2.2 mg/dL (ref 1.7–2.4)

## 2020-09-04 LAB — PHOSPHORUS: Phosphorus: 4 mg/dL (ref 2.5–4.6)

## 2020-09-05 LAB — LEVETIRACETAM LEVEL: Levetiracetam Lvl: 34.2 ug/mL (ref 10.0–40.0)

## 2020-09-07 LAB — BASIC METABOLIC PANEL
Anion gap: 12 (ref 5–15)
BUN: 67 mg/dL — ABNORMAL HIGH (ref 6–20)
CO2: 21 mmol/L — ABNORMAL LOW (ref 22–32)
Calcium: 9.1 mg/dL (ref 8.9–10.3)
Chloride: 103 mmol/L (ref 98–111)
Creatinine, Ser: 0.67 mg/dL (ref 0.44–1.00)
GFR, Estimated: >60 mL/min (ref >60)
Glucose, Bld: 133 mg/dL — ABNORMAL HIGH (ref 70–99)
Potassium: 4.3 mmol/L (ref 3.5–5.1)
Sodium: 136 mmol/L (ref 135–145)

## 2020-09-07 LAB — CBC
HCT: 38.6 % (ref 36.0–46.0)
Hemoglobin: 11.7 g/dL — ABNORMAL LOW (ref 12.0–15.0)
MCH: 31 pg (ref 26.0–34.0)
MCHC: 30.3 g/dL (ref 30.0–36.0)
MCV: 102.4 fL — ABNORMAL HIGH (ref 80.0–100.0)
Platelets: 387 10*3/uL (ref 150–400)
RBC: 3.77 MIL/uL — ABNORMAL LOW (ref 3.87–5.11)
RDW: 21.3 % — ABNORMAL HIGH (ref 11.5–15.5)
WBC: 13.5 10*3/uL — ABNORMAL HIGH (ref 4.0–10.5)
nRBC: 0.7 % — ABNORMAL HIGH (ref 0.0–0.2)

## 2020-09-07 LAB — MAGNESIUM: Magnesium: 1.9 mg/dL (ref 1.7–2.4)

## 2020-09-08 LAB — SARS CORONAVIRUS 2 (TAT 6-24 HRS): SARS Coronavirus 2: NEGATIVE

## 2020-09-09 LAB — BASIC METABOLIC PANEL
Anion gap: 13 (ref 5–15)
BUN: 66 mg/dL — ABNORMAL HIGH (ref 6–20)
CO2: 20 mmol/L — ABNORMAL LOW (ref 22–32)
Calcium: 9.1 mg/dL (ref 8.9–10.3)
Chloride: 103 mmol/L (ref 98–111)
Creatinine, Ser: 0.71 mg/dL (ref 0.44–1.00)
GFR, Estimated: >60 mL/min (ref >60)
Glucose, Bld: 124 mg/dL — ABNORMAL HIGH (ref 70–99)
Potassium: 4.6 mmol/L (ref 3.5–5.1)
Sodium: 136 mmol/L (ref 135–145)

## 2020-09-12 LAB — BASIC METABOLIC PANEL
Anion gap: 10 (ref 5–15)
BUN: 49 mg/dL — ABNORMAL HIGH (ref 6–20)
CO2: 26 mmol/L (ref 22–32)
Calcium: 9.2 mg/dL (ref 8.9–10.3)
Chloride: 103 mmol/L (ref 98–111)
Creatinine, Ser: 0.7 mg/dL (ref 0.44–1.00)
GFR, Estimated: >60 mL/min (ref >60)
Glucose, Bld: 102 mg/dL — ABNORMAL HIGH (ref 70–99)
Potassium: 4.3 mmol/L (ref 3.5–5.1)
Sodium: 139 mmol/L (ref 135–145)

## 2020-09-12 LAB — SARS CORONAVIRUS 2 (TAT 6-24 HRS): SARS Coronavirus 2: NEGATIVE

## 2020-10-08 DEATH — deceased
